# Patient Record
Sex: Female | Born: 1935 | Race: Black or African American | Hispanic: No | Smoking: Never smoker
Health system: Southern US, Community
[De-identification: ages and names within clinical notes are randomized; demographics above are authoritative.]

## PROBLEM LIST (undated history)

## (undated) DIAGNOSIS — N6009 Solitary cyst of unspecified breast: Secondary | ICD-10-CM

## (undated) DIAGNOSIS — M1990 Unspecified osteoarthritis, unspecified site: Secondary | ICD-10-CM

## (undated) DIAGNOSIS — I491 Atrial premature depolarization: Secondary | ICD-10-CM

## (undated) DIAGNOSIS — Z860100 Personal history of colon polyps, unspecified: Secondary | ICD-10-CM

## (undated) DIAGNOSIS — T4145XA Adverse effect of unspecified anesthetic, initial encounter: Secondary | ICD-10-CM

## (undated) DIAGNOSIS — T7840XA Allergy, unspecified, initial encounter: Secondary | ICD-10-CM

## (undated) DIAGNOSIS — D649 Anemia, unspecified: Secondary | ICD-10-CM

## (undated) DIAGNOSIS — K219 Gastro-esophageal reflux disease without esophagitis: Secondary | ICD-10-CM

## (undated) DIAGNOSIS — R011 Cardiac murmur, unspecified: Secondary | ICD-10-CM

## (undated) DIAGNOSIS — M8580 Other specified disorders of bone density and structure, unspecified site: Secondary | ICD-10-CM

## (undated) DIAGNOSIS — M810 Age-related osteoporosis without current pathological fracture: Secondary | ICD-10-CM

## (undated) DIAGNOSIS — I951 Orthostatic hypotension: Secondary | ICD-10-CM

## (undated) DIAGNOSIS — T8859XA Other complications of anesthesia, initial encounter: Secondary | ICD-10-CM

## (undated) DIAGNOSIS — Z5189 Encounter for other specified aftercare: Secondary | ICD-10-CM

## (undated) HISTORY — DX: Atrial premature depolarization: I49.1

## (undated) HISTORY — DX: Anemia, unspecified: D64.9

## (undated) HISTORY — PX: CATARACT EXTRACTION, BILATERAL: SHX1313

## (undated) HISTORY — DX: Solitary cyst of unspecified breast: N60.09

## (undated) HISTORY — DX: Other specified disorders of bone density and structure, unspecified site: M85.80

## (undated) HISTORY — PX: ABDOMINAL HYSTERECTOMY: SHX81

## (undated) HISTORY — DX: Orthostatic hypotension: I95.1

## (undated) HISTORY — DX: Encounter for other specified aftercare: Z51.89

## (undated) HISTORY — PX: APPENDECTOMY: SHX54

## (undated) HISTORY — DX: Cardiac murmur, unspecified: R01.1

## (undated) HISTORY — PX: BREAST CYST EXCISION: SHX579

## (undated) HISTORY — PX: COLONOSCOPY: SHX174

## (undated) HISTORY — DX: Allergy, unspecified, initial encounter: T78.40XA

## (undated) HISTORY — DX: Personal history of colon polyps, unspecified: Z86.0100

## (undated) HISTORY — DX: Unspecified osteoarthritis, unspecified site: M19.90

## (undated) HISTORY — PX: TONSILLECTOMY: SUR1361

## (undated) HISTORY — DX: Personal history of colonic polyps: Z86.010

## (undated) HISTORY — DX: Age-related osteoporosis without current pathological fracture: M81.0

## (undated) HISTORY — DX: Gastro-esophageal reflux disease without esophagitis: K21.9

---

## 1997-10-26 ENCOUNTER — Emergency Department (HOSPITAL_COMMUNITY): Admission: EM | Admit: 1997-10-26 | Discharge: 1997-10-26 | Payer: Self-pay | Admitting: Emergency Medicine

## 1999-01-19 ENCOUNTER — Other Ambulatory Visit: Admission: RE | Admit: 1999-01-19 | Discharge: 1999-01-19 | Payer: Self-pay | Admitting: Obstetrics and Gynecology

## 2000-01-23 ENCOUNTER — Other Ambulatory Visit: Admission: RE | Admit: 2000-01-23 | Discharge: 2000-01-23 | Payer: Self-pay | Admitting: Obstetrics and Gynecology

## 2001-02-03 ENCOUNTER — Other Ambulatory Visit: Admission: RE | Admit: 2001-02-03 | Discharge: 2001-02-03 | Payer: Self-pay | Admitting: Obstetrics and Gynecology

## 2002-02-10 ENCOUNTER — Other Ambulatory Visit: Admission: RE | Admit: 2002-02-10 | Discharge: 2002-02-10 | Payer: Self-pay | Admitting: Obstetrics and Gynecology

## 2003-06-15 ENCOUNTER — Encounter: Payer: Self-pay | Admitting: Internal Medicine

## 2004-05-24 ENCOUNTER — Ambulatory Visit: Payer: Self-pay | Admitting: Internal Medicine

## 2004-09-29 ENCOUNTER — Ambulatory Visit: Payer: Self-pay | Admitting: Internal Medicine

## 2004-10-19 ENCOUNTER — Ambulatory Visit: Payer: Self-pay | Admitting: Internal Medicine

## 2004-12-07 ENCOUNTER — Ambulatory Visit: Payer: Self-pay | Admitting: Internal Medicine

## 2004-12-14 ENCOUNTER — Ambulatory Visit: Payer: Self-pay | Admitting: Internal Medicine

## 2004-12-28 ENCOUNTER — Ambulatory Visit: Payer: Self-pay | Admitting: Internal Medicine

## 2005-02-22 ENCOUNTER — Ambulatory Visit: Payer: Self-pay | Admitting: Internal Medicine

## 2005-04-17 ENCOUNTER — Ambulatory Visit: Payer: Self-pay | Admitting: Internal Medicine

## 2005-12-20 ENCOUNTER — Ambulatory Visit: Payer: Self-pay | Admitting: Internal Medicine

## 2005-12-27 ENCOUNTER — Ambulatory Visit: Payer: Self-pay | Admitting: Internal Medicine

## 2006-02-19 ENCOUNTER — Ambulatory Visit: Payer: Self-pay | Admitting: Internal Medicine

## 2006-08-06 ENCOUNTER — Ambulatory Visit: Payer: Self-pay | Admitting: Internal Medicine

## 2006-08-06 LAB — CONVERTED CEMR LAB
Basophils Relative: 0.8 % (ref 0.0–1.0)
Bilirubin Urine: NEGATIVE
Bilirubin, Direct: 0.1 mg/dL (ref 0.0–0.3)
CO2: 29 meq/L (ref 19–32)
Eosinophils Absolute: 0.1 10*3/uL (ref 0.0–0.6)
Eosinophils Relative: 1.9 % (ref 0.0–5.0)
GFR calc Af Amer: 92 mL/min
Glucose, Bld: 87 mg/dL (ref 70–99)
Hemoglobin: 13.1 g/dL (ref 12.0–15.0)
Leukocytes, UA: NEGATIVE
Lymphocytes Relative: 40.7 % (ref 12.0–46.0)
MCV: 92.5 fL (ref 78.0–100.0)
Monocytes Absolute: 0.5 10*3/uL (ref 0.2–0.7)
Monocytes Relative: 9.5 % (ref 3.0–11.0)
Neutro Abs: 2.7 10*3/uL (ref 1.4–7.7)
Potassium: 4.2 meq/L (ref 3.5–5.1)
Specific Gravity, Urine: 1.01 (ref 1.000–1.03)
TSH: 3.28 microintl units/mL (ref 0.35–5.50)
Total Protein, Urine: NEGATIVE mg/dL
Total Protein: 7.9 g/dL (ref 6.0–8.3)
WBC: 5.6 10*3/uL (ref 4.5–10.5)

## 2006-08-27 ENCOUNTER — Encounter: Payer: Self-pay | Admitting: Cardiology

## 2006-08-27 ENCOUNTER — Ambulatory Visit: Payer: Self-pay

## 2006-09-06 ENCOUNTER — Ambulatory Visit: Payer: Self-pay | Admitting: Internal Medicine

## 2006-09-12 ENCOUNTER — Ambulatory Visit: Payer: Self-pay | Admitting: Cardiovascular Disease

## 2006-09-16 ENCOUNTER — Ambulatory Visit: Payer: Self-pay | Admitting: Cardiovascular Disease

## 2006-09-16 ENCOUNTER — Ambulatory Visit (HOSPITAL_COMMUNITY): Admission: RE | Admit: 2006-09-16 | Discharge: 2006-09-16 | Payer: Self-pay | Admitting: Cardiovascular Disease

## 2006-09-20 ENCOUNTER — Ambulatory Visit: Payer: Self-pay

## 2006-12-31 ENCOUNTER — Ambulatory Visit: Payer: Self-pay | Admitting: Internal Medicine

## 2006-12-31 LAB — CONVERTED CEMR LAB
ALT: 18 units/L (ref 0–40)
Alkaline Phosphatase: 55 units/L (ref 39–117)
BUN: 9 mg/dL (ref 6–23)
Basophils Relative: 0.8 % (ref 0.0–1.0)
Bilirubin Urine: NEGATIVE
CO2: 28 meq/L (ref 19–32)
Calcium: 9.6 mg/dL (ref 8.4–10.5)
Cholesterol: 281 mg/dL (ref 0–200)
GFR calc Af Amer: 107 mL/min
HDL: 127 mg/dL (ref >39.0)
Hemoglobin, Urine: NEGATIVE
Hemoglobin: 12.2 g/dL (ref 12.0–15.0)
Leukocytes, UA: NEGATIVE
Lymphocytes Relative: 41.6 % (ref 12.0–46.0)
MCHC: 33.3 g/dL (ref 30.0–36.0)
Monocytes Relative: 10.5 % (ref 3.0–11.0)
Specific Gravity, Urine: 1.01 (ref 1.000–1.03)
Total Protein: 7.9 g/dL (ref 6.0–8.3)

## 2007-04-02 ENCOUNTER — Ambulatory Visit: Payer: Self-pay | Admitting: Internal Medicine

## 2007-05-29 ENCOUNTER — Encounter: Admission: RE | Admit: 2007-05-29 | Discharge: 2007-05-29 | Payer: Self-pay | Admitting: Obstetrics and Gynecology

## 2007-06-10 ENCOUNTER — Encounter: Payer: Self-pay | Admitting: Internal Medicine

## 2007-06-17 ENCOUNTER — Ambulatory Visit: Payer: Self-pay | Admitting: Internal Medicine

## 2007-06-17 DIAGNOSIS — M810 Age-related osteoporosis without current pathological fracture: Secondary | ICD-10-CM | POA: Insufficient documentation

## 2007-06-17 DIAGNOSIS — Z860100 Personal history of colon polyps, unspecified: Secondary | ICD-10-CM | POA: Insufficient documentation

## 2007-06-17 DIAGNOSIS — K219 Gastro-esophageal reflux disease without esophagitis: Secondary | ICD-10-CM | POA: Insufficient documentation

## 2007-06-17 DIAGNOSIS — E559 Vitamin D deficiency, unspecified: Secondary | ICD-10-CM | POA: Insufficient documentation

## 2007-06-17 DIAGNOSIS — J45909 Unspecified asthma, uncomplicated: Secondary | ICD-10-CM

## 2007-06-17 DIAGNOSIS — M1990 Unspecified osteoarthritis, unspecified site: Secondary | ICD-10-CM | POA: Insufficient documentation

## 2007-09-16 ENCOUNTER — Telehealth: Payer: Self-pay | Admitting: Internal Medicine

## 2007-09-23 ENCOUNTER — Ambulatory Visit: Payer: Self-pay | Admitting: Internal Medicine

## 2007-09-23 LAB — CONVERTED CEMR LAB
BUN: 10 mg/dL (ref 6–23)
CO2: 31 meq/L (ref 19–32)
Calcium: 9.4 mg/dL (ref 8.4–10.5)
Chloride: 105 meq/L (ref 96–112)
Creatinine, Ser: 0.8 mg/dL (ref 0.4–1.2)
Vit D, 1,25-Dihydroxy: 37 (ref 30–89)

## 2007-09-25 ENCOUNTER — Encounter: Payer: Self-pay | Admitting: Internal Medicine

## 2007-09-29 ENCOUNTER — Ambulatory Visit: Payer: Self-pay | Admitting: Internal Medicine

## 2007-09-30 ENCOUNTER — Encounter (INDEPENDENT_AMBULATORY_CARE_PROVIDER_SITE_OTHER): Payer: Self-pay | Admitting: *Deleted

## 2007-10-08 ENCOUNTER — Telehealth: Payer: Self-pay | Admitting: Internal Medicine

## 2007-10-28 ENCOUNTER — Ambulatory Visit: Payer: Self-pay | Admitting: Internal Medicine

## 2007-10-29 ENCOUNTER — Ambulatory Visit: Payer: Self-pay | Admitting: Internal Medicine

## 2007-10-30 ENCOUNTER — Encounter: Payer: Self-pay | Admitting: Internal Medicine

## 2007-10-31 ENCOUNTER — Ambulatory Visit: Payer: Self-pay | Admitting: Internal Medicine

## 2007-12-15 ENCOUNTER — Encounter: Payer: Self-pay | Admitting: Internal Medicine

## 2007-12-24 ENCOUNTER — Encounter: Payer: Self-pay | Admitting: Internal Medicine

## 2007-12-25 ENCOUNTER — Ambulatory Visit: Payer: Self-pay | Admitting: Internal Medicine

## 2007-12-31 ENCOUNTER — Ambulatory Visit: Payer: Self-pay | Admitting: Internal Medicine

## 2007-12-31 LAB — CONVERTED CEMR LAB
AST: 20 units/L (ref 0–37)
Basophils Absolute: 0 10*3/uL (ref 0.0–0.1)
Basophils Relative: 0.9 % (ref 0.0–1.0)
Chloride: 103 meq/L (ref 96–112)
Cholesterol: 255 mg/dL (ref 0–200)
Creatinine, Ser: 0.8 mg/dL (ref 0.4–1.2)
Direct LDL: 119.8 mg/dL
Eosinophils Absolute: 0.2 10*3/uL (ref 0.0–0.7)
GFR calc Af Amer: 91 mL/min
GFR calc non Af Amer: 76 mL/min
HCT: 37.3 % (ref 36.0–46.0)
HDL: 116.1 mg/dL (ref >39.0)
Hemoglobin, Urine: NEGATIVE
Ketones, ur: NEGATIVE mg/dL
MCHC: 34 g/dL (ref 30.0–36.0)
MCV: 93.9 fL (ref 78.0–100.0)
Monocytes Absolute: 0.5 10*3/uL (ref 0.1–1.0)
Neutrophils Relative %: 39.1 % — ABNORMAL LOW (ref 43.0–77.0)
Platelets: 238 10*3/uL (ref 150–400)
Potassium: 4.2 meq/L (ref 3.5–5.1)
Total Bilirubin: 1 mg/dL (ref 0.3–1.2)
Triglycerides: 53 mg/dL (ref 0–149)
Urine Glucose: NEGATIVE mg/dL
Urobilinogen, UA: 0.2 (ref 0.0–1.0)
VLDL: 11 mg/dL (ref 0–40)

## 2008-01-05 ENCOUNTER — Telehealth (INDEPENDENT_AMBULATORY_CARE_PROVIDER_SITE_OTHER): Payer: Self-pay | Admitting: *Deleted

## 2008-01-07 ENCOUNTER — Telehealth (INDEPENDENT_AMBULATORY_CARE_PROVIDER_SITE_OTHER): Payer: Self-pay | Admitting: *Deleted

## 2008-01-07 ENCOUNTER — Ambulatory Visit: Payer: Self-pay | Admitting: Internal Medicine

## 2008-01-09 ENCOUNTER — Telehealth: Payer: Self-pay | Admitting: Pulmonary Disease

## 2008-02-05 ENCOUNTER — Ambulatory Visit: Payer: Self-pay | Admitting: Internal Medicine

## 2008-04-29 ENCOUNTER — Ambulatory Visit: Payer: Self-pay | Admitting: Internal Medicine

## 2008-07-06 ENCOUNTER — Ambulatory Visit: Payer: Self-pay | Admitting: Internal Medicine

## 2008-07-09 HISTORY — PX: ESOPHAGOGASTRODUODENOSCOPY: SHX1529

## 2008-07-13 ENCOUNTER — Ambulatory Visit: Payer: Self-pay | Admitting: Internal Medicine

## 2008-07-13 LAB — CONVERTED CEMR LAB
Calcium: 9.4 mg/dL (ref 8.4–10.5)
Chloride: 104 meq/L (ref 96–112)
Creatinine, Ser: 0.9 mg/dL (ref 0.4–1.2)
Glucose, Bld: 118 mg/dL — ABNORMAL HIGH (ref 70–99)
Potassium: 4 meq/L (ref 3.5–5.1)
Sodium: 140 meq/L (ref 135–145)
Vit D, 1,25-Dihydroxy: 30 (ref 30–89)

## 2008-07-16 ENCOUNTER — Ambulatory Visit: Payer: Self-pay | Admitting: Internal Medicine

## 2008-07-21 ENCOUNTER — Encounter: Payer: Self-pay | Admitting: Internal Medicine

## 2008-07-21 ENCOUNTER — Ambulatory Visit: Payer: Self-pay | Admitting: Internal Medicine

## 2008-07-27 ENCOUNTER — Encounter: Payer: Self-pay | Admitting: Internal Medicine

## 2008-12-30 ENCOUNTER — Encounter: Payer: Self-pay | Admitting: Internal Medicine

## 2009-01-18 ENCOUNTER — Ambulatory Visit: Payer: Self-pay | Admitting: Internal Medicine

## 2009-01-18 LAB — CONVERTED CEMR LAB
Basophils Relative: 0.4 % (ref 0.0–3.0)
Eosinophils Relative: 2.5 % (ref 0.0–5.0)
HCT: 34 % — ABNORMAL LOW (ref 36.0–46.0)
Hemoglobin: 11.8 g/dL — ABNORMAL LOW (ref 12.0–15.0)
Lymphs Abs: 3.1 10*3/uL (ref 0.7–4.0)
Monocytes Relative: 7.8 % (ref 3.0–12.0)
Platelets: 217 10*3/uL (ref 150.0–400.0)
RBC: 3.59 M/uL — ABNORMAL LOW (ref 3.87–5.11)
WBC: 6.6 10*3/uL (ref 4.5–10.5)

## 2009-01-21 LAB — CONVERTED CEMR LAB
ALT: 18 units/L (ref 0–35)
AST: 25 units/L (ref 0–37)
Albumin: 3.8 g/dL (ref 3.5–5.2)
Alkaline Phosphatase: 54 units/L (ref 39–117)
Bilirubin Urine: NEGATIVE
Bilirubin, Direct: 0.1 mg/dL (ref 0.0–0.3)
CO2: 27 meq/L (ref 19–32)
Chloride: 103 meq/L (ref 96–112)
Cholesterol: 274 mg/dL — ABNORMAL HIGH (ref 0–200)
Creatinine, Ser: 0.7 mg/dL (ref 0.4–1.2)
Direct LDL: 131.7 mg/dL
HDL: 135.7 mg/dL (ref >39.00)
Hemoglobin, Urine: NEGATIVE
Ketones, ur: NEGATIVE mg/dL
Total CHOL/HDL Ratio: 2
Total Protein, Urine: NEGATIVE mg/dL
Urine Glucose: NEGATIVE mg/dL
Urobilinogen, UA: 0.2 (ref 0.0–1.0)
VLDL: 8.4 mg/dL (ref 0.0–40.0)

## 2009-02-02 ENCOUNTER — Telehealth: Payer: Self-pay | Admitting: Internal Medicine

## 2009-07-26 ENCOUNTER — Ambulatory Visit: Payer: Self-pay | Admitting: Internal Medicine

## 2009-10-05 ENCOUNTER — Ambulatory Visit: Payer: Self-pay | Admitting: Internal Medicine

## 2009-10-05 DIAGNOSIS — K649 Unspecified hemorrhoids: Secondary | ICD-10-CM

## 2009-10-06 ENCOUNTER — Telehealth: Payer: Self-pay | Admitting: Internal Medicine

## 2009-10-17 ENCOUNTER — Ambulatory Visit: Payer: Self-pay | Admitting: Internal Medicine

## 2009-10-17 DIAGNOSIS — D649 Anemia, unspecified: Secondary | ICD-10-CM

## 2009-10-19 LAB — CONVERTED CEMR LAB
Basophils Relative: 0.5 % (ref 0.0–3.0)
Eosinophils Absolute: 0.1 10*3/uL (ref 0.0–0.7)
Eosinophils Relative: 2 % (ref 0.0–5.0)
HCT: 36.8 % (ref 36.0–46.0)
Lymphs Abs: 2.9 10*3/uL (ref 0.7–4.0)
MCHC: 34.4 g/dL (ref 30.0–36.0)
MCV: 93.1 fL (ref 78.0–100.0)
Monocytes Absolute: 0.7 10*3/uL (ref 0.1–1.0)
Platelets: 256 10*3/uL (ref 150.0–400.0)
WBC: 6.5 10*3/uL (ref 4.5–10.5)

## 2010-01-02 ENCOUNTER — Encounter: Payer: Self-pay | Admitting: Internal Medicine

## 2010-01-27 ENCOUNTER — Ambulatory Visit: Payer: Self-pay | Admitting: Internal Medicine

## 2010-01-27 LAB — CONVERTED CEMR LAB
ALT: 16 U/L
AST: 22 U/L
Albumin: 4 g/dL
Alkaline Phosphatase: 48 U/L
BUN: 12 mg/dL
Basophils Absolute: 0 10*3/uL
Basophils Relative: 0.6 %
Bilirubin Urine: NEGATIVE
Bilirubin, Direct: 0.1 mg/dL
CO2: 28 meq/L
Calcium: 9.4 mg/dL
Chloride: 103 meq/L
Cholesterol: 281 mg/dL — ABNORMAL HIGH
Creatinine, Ser: 0.8 mg/dL
Direct LDL: 104.9 mg/dL
Eosinophils Absolute: 0.1 10*3/uL
Eosinophils Relative: 2.4 %
GFR calc non Af Amer: 96.2 mL/min
Glucose, Bld: 96 mg/dL
HCT: 37.8 %
HDL: 130.5 mg/dL
Hemoglobin, Urine: NEGATIVE
Hemoglobin: 12.9 g/dL
Ketones, ur: NEGATIVE mg/dL
Leukocytes, UA: NEGATIVE
Lymphocytes Relative: 42.4 %
Lymphs Abs: 2.4 10*3/uL
MCHC: 34.1 g/dL
MCV: 95.4 fL
Monocytes Absolute: 0.4 10*3/uL
Monocytes Relative: 7.6 %
Neutro Abs: 2.7 10*3/uL
Neutrophils Relative %: 47 %
Nitrite: NEGATIVE
Platelets: 206 10*3/uL
Potassium: 4.5 meq/L
RBC: 3.96 M/uL
RDW: 12.6 %
Sodium: 140 meq/L
Specific Gravity, Urine: 1.01
TSH: 2.79 u[IU]/mL
Total Bilirubin: 0.7 mg/dL
Total CHOL/HDL Ratio: 2
Total Protein, Urine: NEGATIVE mg/dL
Total Protein: 7.4 g/dL
Triglycerides: 76 mg/dL
Urine Glucose: NEGATIVE mg/dL
Urobilinogen, UA: 0.2
VLDL: 15.2 mg/dL
Vitamin B-12: 382 pg/mL
WBC: 5.7 10*3/uL
pH: 6.5

## 2010-02-02 ENCOUNTER — Ambulatory Visit: Payer: Self-pay | Admitting: Internal Medicine

## 2010-02-02 ENCOUNTER — Encounter: Payer: Self-pay | Admitting: Internal Medicine

## 2010-04-27 ENCOUNTER — Ambulatory Visit: Payer: Self-pay | Admitting: Internal Medicine

## 2010-05-15 ENCOUNTER — Telehealth: Payer: Self-pay | Admitting: Internal Medicine

## 2010-05-15 ENCOUNTER — Ambulatory Visit: Payer: Self-pay | Admitting: Internal Medicine

## 2010-05-17 ENCOUNTER — Telehealth: Payer: Self-pay | Admitting: Internal Medicine

## 2010-05-29 ENCOUNTER — Ambulatory Visit: Payer: Self-pay | Admitting: Internal Medicine

## 2010-08-08 NOTE — Assessment & Plan Note (Signed)
Summary: 2 WK FU  STC   Vital Signs:  Patient profile:   75 year old female Menstrual status:  postmenopausal Height:      63 inches Weight:      133.75 pounds BMI:     23.78 Temp:     97.8 degrees F oral Pulse rate:   84 / minute Pulse rhythm:   regular Resp:     16 per minute BP sitting:   130 / 70  (left arm) Cuff size:   regular  Vitals Entered By: Lanier Prude, Beverly Gust) (May 29, 2010 3:34 PM) CC: 2 wk f/u. feels much better still c/o sporadic cough and congestion Is Patient Diabetic? No   CC:  2 wk f/u. feels much better still c/o sporadic cough and congestion.  Current Medications (verified): 1)  Pantoprazole Sodium 40 Mg Tbec (Pantoprazole Sodium) .... Take 1 Tablet By Mouth Once A Day 2)  Estrogel 0.75 Mg/1.25 Gm (0.06%)  Gel (Estradiol) .... Use Qd 3)  Vitamin D3 1000 Unit  Tabs (Cholecalciferol) .Marland Kitchen.. 1  By Mouth Daily 4)  Claritin 10 Mg  Caps (Loratadine) .... As Needed 5)  Symbicort 80-4.5 Mcg/act  Aero (Budesonide-Formoterol Fumarate) .... Two Puffs First Thing in Am and Then Again in Evening 6)  Ventolin Hfa 108 (90 Base) Mcg/act  Aers (Albuterol Sulfate) .Marland Kitchen.. 1-2 Puffs Every 4-6 Hours As Needed 7)  Anusol-Hc 2.5 % Crea (Hydrocortisone) .... Once Daily- Two Times A Day Prn 8)  Premarin 0.625 Mg/gm Crea (Estrogens, Conjugated) .... 3 Times Weekly 9)  Anusol-Hc 25 Mg Supp (Hydrocortisone Acetate) .Marland Kitchen.. 1 Pr Bid 10)  Miralax  Powd (Polyethylene Glycol 3350) .Marland Kitchen.. 1 Scoop Daily As Needed 11)  Mytussin Ac 100-10 Mg/78ml Syrp (Guaifenesin-Codeine) .... 5-10 Ml By Mouth Qid As Needed For Cough  Allergies (verified): 1)  ! * Novocaine 2)  ! * Shellfish 3)  * Statins  Physical Exam  General:  Well-developed,well-nourished,in no acute distress; alert,appropriate and cooperative throughout examination Mouth:  Oral mucosa and oropharynx without lesions or exudates.  Teeth in good repair. Lungs:  normal respiratory effort, no intercostal retractions, no accessory  muscle use, normal breath sounds, no dullness, no fremitus, no crackles, and no wheezes.   Heart:  normal rate, regular rhythm, no murmur, no gallop, no rub, and no JVD.   Skin:  turgor normal, color normal, no rashes, no suspicious lesions, no ecchymoses, no petechiae, no purpura, no ulcerations, and no edema.   Psych:  Cognition and judgment appear intact. Alert and cooperative with normal attention span and concentration. No apparent delusions, illusions, hallucinations   Impression & Recommendations:  Problem # 1:  URI (ICD-465.9) Assessment Improved COPD on CXR likely is reflecting her asthma/URI Her updated medication list for this problem includes:    Claritin 10 Mg Caps (Loratadine) .Marland Kitchen... As needed    Mytussin Ac 100-10 Mg/50ml Syrp (Guaifenesin-codeine) .Marland Kitchen... 5-10 ml by mouth qid as needed for cough  Problem # 2:  Cardiomegaly on CXR Assessment: Comment Only I reviewed Card w/u from 2008 - Dr Eden Emms  Complete Medication List: 1)  Pantoprazole Sodium 40 Mg Tbec (Pantoprazole sodium) .... Take 1 tablet by mouth once a day 2)  Estrogel 0.75 Mg/1.25 Gm (0.06%) Gel (Estradiol) .... Use qd 3)  Vitamin D3 1000 Unit Tabs (Cholecalciferol) .Marland Kitchen.. 1  by mouth daily 4)  Claritin 10 Mg Caps (Loratadine) .... As needed 5)  Symbicort 80-4.5 Mcg/act Aero (Budesonide-formoterol fumarate) .... Two puffs first thing in am and then again in  evening 6)  Ventolin Hfa 108 (90 Base) Mcg/act Aers (Albuterol sulfate) .Marland Kitchen.. 1-2 puffs every 4-6 hours as needed 7)  Anusol-hc 2.5 % Crea (Hydrocortisone) .... Once daily- two times a day prn 8)  Premarin 0.625 Mg/gm Crea (Estrogens, conjugated) .... 3 times weekly 9)  Anusol-hc 25 Mg Supp (Hydrocortisone acetate) .Marland Kitchen.. 1 pr bid 10)  Miralax Powd (Polyethylene glycol 3350) .Marland Kitchen.. 1 scoop daily as needed 11)  Mytussin Ac 100-10 Mg/59ml Syrp (Guaifenesin-codeine) .... 5-10 ml by mouth qid as needed for cough  Patient Instructions: 1)  Call if you are not well in a  reasonable amount of time.    Orders Added: 1)  Est. Patient Level III [84132]

## 2010-08-08 NOTE — Assessment & Plan Note (Signed)
Summary: YEARLY FU/ LABS SAME DAY /MEDICARE/NWS   Vital Signs:  Patient profile:   75 year old female Height:      63 inches Weight:      134 pounds BMI:     23.82 Temp:     97.3 degrees F oral Pulse rate:   76 / minute Pulse rhythm:   irregular Resp:     16 per minute BP sitting:   130 / 80  (left arm) Cuff size:   regular  Vitals Entered By: Lanier Prude, Beverly Gust) (February 02, 2010 10:39 AM) CC: cpx Is Patient Diabetic? No Comments pt needs Rf on Ventolin HFA   Primary Care Provider:  Sula Soda, MD   CC:  cpx.  History of Present Illness: The patient presents for a wellness examination  Patient past medical history, social history, and family history reviewed in detail no significant changes.  Patient is physically active. Depression is negative and mood is good. Hearing is normal, and able to perform activities of daily living. Risk of falling is negligible and home safety has been reviewed and is appropriate. Patient has normal height, weight, and visual acuity. Patient has been counseled on age-appropriate routine health concerns for screening and prevention. Education, counseling done.  The patient presents for a follow up of OA, GERD and asthma  Current Medications (verified): 1)  Pantoprazole Sodium 40 Mg Tbec (Pantoprazole Sodium) .... Take 1 Tablet By Mouth Once A Day 2)  Estrogel 0.75 Mg/1.25 Gm (0.06%)  Gel (Estradiol) .... Use Qd 3)  Vitamin D3 1000 Unit  Tabs (Cholecalciferol) .Marland Kitchen.. 1  By Mouth Daily 4)  Claritin 10 Mg  Caps (Loratadine) .... As Needed 5)  Symbicort 80-4.5 Mcg/act  Aero (Budesonide-Formoterol Fumarate) .... Two Puffs First Thing in Am and Then Again in Evening 6)  Ventolin Hfa 108 (90 Base) Mcg/act  Aers (Albuterol Sulfate) .Marland Kitchen.. 1-2 Puffs Every 4-6 Hours As Needed 7)  Anusol-Hc 2.5 % Crea (Hydrocortisone) .... Once Daily- Two Times A Day Prn 8)  Premarin 0.625 Mg/gm Crea (Estrogens, Conjugated) .... 3 Times Weekly 9)  Anusol-Hc 25 Mg Supp  (Hydrocortisone Acetate) .Marland Kitchen.. 1 Pr Bid 10)  Miralax  Powd (Polyethylene Glycol 3350) .Marland Kitchen.. 1 Scoop Daily As Needed  Allergies (verified): 1)  ! * Novocaine 2)  ! * Shellfish 3)  * Statins  Past History:  Past Medical History: Last updated: 10/17/2009 Asthma Dr Sherene Sires   - PFT 02/05/08 FEV1 62%, ratio of 60%, with 12% improvement after bronchodilator Colonic polyps, hx of adenomas (small)1998 and 201...Marland KitchenMarland KitchenGessner GERD Osteoarthritis Osteopenia PAC L breast cyst Vit D def GI Dr Leone Payor - colon 2010 Gyn   Dr Rosalio Macadamia Card Dr Eden Emms Hemorrhoids  Past Surgical History: Last updated: 10/30/2007 Hysterectomy complete Appendectomy Tonsillectomy Breast cysts  Family History: Last updated: 07/16/2008 Family History Hypertension Family History of Diabetes:M 90  No FH of Colon Cancer: no family history of atopy or respiratory disease  Social History: Last updated: 10/17/2009 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    Physical Exam  General:  Well-developed,well-nourished,in no acute distress; alert,appropriate and cooperative throughout examination Head:  Normocephalic and atraumatic without obvious abnormalities. No apparent alopecia or balding. Eyes:  No corneal or conjunctival inflammation noted. EOMI. Perrla. Ears:  External ear exam shows no significant lesions or deformities.  Otoscopic examination reveals clear canals,  tympanic membranes are intact bilaterally without bulging, retraction, inflammation or discharge. Hearing is grossly normal bilaterally. Nose:  External nasal examination shows no deformity or inflammation. Nasal mucosa are pink and moist without lesions or exudates. Mouth:  Oral mucosa and oropharynx without lesions or exudates.  Teeth in good repair. Neck:  No deformities, masses, or tenderness noted. Chest Wall:  No deformities, masses, or tenderness noted. Lungs:  Normal respiratory effort, chest expands symmetrically. Lungs are clear to auscultation, no crackles or wheezes. Heart:  Normal rate and regular rhythm. S1 and S2 normal without gallop, murmur, click, rub or other extra sounds. Abdomen:  Bowel sounds positive,abdomen soft and non-tender without masses, organomegaly or hernias noted. Msk:  No deformity or scoliosis noted of thoracic or lumbar spine.   Pulses:  R and L carotid,radial,femoral,dorsalis pedis and posterior tibial pulses are full and equal bilaterally Extremities:  No clubbing, cyanosis, edema, or deformity noted with normal full range of motion of all joints.   Neurologic:  No cranial nerve deficits noted. Station and gait are normal. Plantar reflexes are down-going bilaterally. DTRs are symmetrical throughout. Sensory, motor and coordinative functions appear intact. Skin:  Intact without suspicious lesions or rashes Cervical Nodes:  No significant cervical,  or inguinal adenopathy. Inguinal Nodes:  No significant adenopathy Psych:  Cognition and judgment appear intact. Alert and cooperative with normal attention span and concentration. No apparent delusions, illusions, hallucinations   Impression & Recommendations:  Problem # 1:  PHYSICAL EXAMINATION (ICD-V70.0) Assessment New Overall doing well, age appropriate education and counseling updated and referral for appropriate preventive services done unless declined, immunizations up to date or declined, diet counseling  done if overweight, urged to quit smoking if smokes, most recent labs reviewed and current ordered if appropriate, ecg reviewed or declined (interpretation per ECG scanned in the EMR if done); information regarding Medicare Preventation requirements given if appropriate.  Colon, gyn - up to date The labs were reviewed with the patient.   Problem # 2:  HEMORRHOIDS, NOS (ICD-455.6) Assessment: Improved Miralax  Problem # 3:  ASTHMA (ICD-493.90) Assessment: Improved  Her updated medication list for this problem includes:    Symbicort 80-4.5 Mcg/act Aero (Budesonide-formoterol fumarate) .Marland Kitchen..Marland Kitchen Two puffs first thing in am and then again in evening    Ventolin Hfa 108 (90 Base) Mcg/act Aers (Albuterol sulfate) .Marland Kitchen... 1-2 puffs every 4-6 hours as needed  Problem # 4:  OSTEOARTHRITIS (ICD-715.90) Assessment: Unchanged  Complete Medication List: 1)  Pantoprazole Sodium 40 Mg Tbec (Pantoprazole sodium) .... Take 1 tablet by mouth once a day 2)  Estrogel 0.75 Mg/1.25 Gm (0.06%) Gel (Estradiol) .... Use qd 3)  Vitamin D3 1000 Unit Tabs (Cholecalciferol) .Marland Kitchen.. 1  by mouth daily 4)  Claritin 10 Mg Caps (Loratadine) .... As needed 5)  Symbicort 80-4.5 Mcg/act Aero (Budesonide-formoterol fumarate) .... Two puffs first thing in am and then again in evening 6)  Ventolin Hfa 108 (90 Base) Mcg/act Aers (Albuterol sulfate) .Marland Kitchen.. 1-2 puffs every 4-6 hours as needed 7)  Anusol-hc 2.5 % Crea (Hydrocortisone) .... Once daily- two times a day prn 8)  Premarin 0.625 Mg/gm Crea (Estrogens, conjugated) .... 3 times weekly 9)  Anusol-hc 25 Mg Supp (Hydrocortisone acetate) .Marland Kitchen.. 1 pr bid 10)  Miralax Powd (Polyethylene glycol 3350) .Marland KitchenMarland KitchenMarland Kitchen  1 scoop daily as needed  Other Orders: EKG w/ Interpretation (93000) Pneumococcal Vaccine (16109) Admin 1st Vaccine (60454)  Patient Instructions: 1)  Start taking a yoga/chair yoga class  Prescriptions: SYMBICORT 80-4.5 MCG/ACT  AERO (BUDESONIDE-FORMOTEROL FUMARATE) Two puffs  first thing in am and then again in evening  #1 x 11   Entered and Authorized by:   Tresa Garter MD   Signed by:   Tresa Garter MD on 02/02/2010   Method used:   Print then Give to Patient   RxID:   [id number] VENTOLIN HFA 108 (90 BASE) MCG/ACT  AERS (ALBUTEROL SULFATE) 1-2 puffs every 4-6 hours as needed  #1 x 12   Entered and Authorized by:   Tresa Garter MD   Signed by:   Tresa Garter MD on 02/02/2010   Method used:   Electronically to        [facility] [id number] (retail)       [address] [place], [place]  [address]       Ph: [phone]       Fax: [phone]   RxID:   [id number]    Immunizations Administered:  Pneumonia Vaccine:    Vaccine Type: Pneumovax    Site: left deltoid    Mfr: Merck    Dose: 0.5 ml    Route: IM    Given by: Lanier Prude, CMA(AAMA)    Exp. Date: 07/26/2011    Lot #: [id number]    VIS given: 02/04/96 version given February 02, 2010.

## 2010-08-08 NOTE — Procedures (Signed)
Summary: Colonoscopy: Dr. Doreatha Martin: Normal    EGD  Procedure date:  06/15/2003  Findings:      Findings: Normal  Location: [facility]    Procedures Next Due Date:    EGD: 06/2008  Patient Name: Cindy, Meza MRN: [id number] Procedure Procedures: Colonoscopy CPT: 40981.  Personnel: Endoscopist: Ulyess Mort, MD.  Exam Location: Exam performed in Outpatient Clinic. Outpatient  Patient Consent: Procedure, Alternatives, Risks and Benefits discussed, consent obtained, from patient. Consent was obtained by the RN.  Indications  Surveillance of: Adenomatous Polyp(s).  History  Current Medications: Patient is not currently taking Coumadin.  Pre-Exam Physical: Entire physical exam was normal.  Exam Exam: Extent of exam reached: Cecum, extent intended: Cecum.  The cecum was identified by appendiceal orifice and IC valve. Colon retroflexion performed. Images were not taken. ASA Classification: II. Tolerance: good.  Monitoring: Pulse and BP monitoring, Oximetry used. Supplemental O2 given.  Colon Prep Prep results: good.  Sedation Meds: Patient assessed and found to be appropriate for moderate (conscious) sedation. Fentanyl 100 mcg. given IV. Versed 10 mg. given IV.  Findings - NOT SEEN ON EXAM: Cecum to Rectum. Polyps, AVM's, Colitis, Tumors, Melanosis, Crohn's, Diverticulosis,   Assessment Normal examination.  Events  Unplanned Interventions: No intervention was required.  Unplanned Events: There were no complications. Plans Patient Education: Patient given standard instructions for: a normal exam. Yearly hemoccult testing recommended. Patient instructed to get routine colonoscopy every 5 years.  Disposition: After procedure patient sent to recovery. After recovery patient sent home.  Patient Name: Cindy, Meza MRN: [id number] Procedure Procedures: Colonoscopy CPT: 62130.  Personnel: Endoscopist: Ulyess Mort, MD.  Exam  Location: Exam performed in Outpatient Clinic. Outpatient  Patient Consent: Procedure, Alternatives, Risks and Benefits discussed, consent obtained, from patient. Consent was obtained by the RN.  Indications  Surveillance of: Adenomatous Polyp(s).  History  Current Medications: Patient is not currently taking Coumadin.  Pre-Exam Physical: Entire physical exam was normal.  Exam Exam: Extent of exam reached: Cecum, extent intended: Cecum.  The cecum was identified by appendiceal orifice and IC valve. Colon retroflexion performed. Images were not taken. ASA Classification: II. Tolerance: good.  Monitoring: Pulse and BP monitoring, Oximetry used. Supplemental O2 given.  Colon Prep Prep results: good.  Sedation Meds: Patient assessed and found to be appropriate for moderate (conscious) sedation. Fentanyl 100 mcg. given IV. Versed 10 mg. given IV.  Findings - NOT SEEN ON EXAM: Cecum to Rectum. Polyps, AVM's, Colitis, Tumors, Melanosis, Crohn's, Diverticulosis,   Assessment Normal examination.  Events  Unplanned Interventions: No intervention was required.  Unplanned Events: There were no complications. PlansPatient Education: Patient given standard instructions for: a normal exam. Yearly hemoccult testing recommended. Patient instructed to get routine colonoscopy every 5 years.  Disposition: After procedure patient sent to recovery. After recovery patient sent home.   This report was created from the original endoscopy report, which was reviewed and signed by the above listed endoscopist.

## 2010-08-08 NOTE — Progress Notes (Signed)
Summary: RESULTS  Phone Note Call from Patient Call back at Home Phone [phone]   Summary of Call: Patient is requesting results of cxr. Initial call taken by: Lamar Sprinkles, CMA,  May 17, 2010 12:25 PM  Follow-up for Phone Call        slightly enlarged heart and COPD but no pneumonia Follow-up by: Etta Grandchild MD,  May 17, 2010 12:28 PM  Additional Follow-up for Phone Call Additional follow up Details #1::        called pt and advised that there was no pneumonia, slightly enlarged heart and COPD.   pt stated that she has been coughing up some yellowish mucus today and wanted to know if there was anything else Dr Yetta Barre would like for her to take. Please advise. Additional Follow-up by: Alysia Penna,  May 17, 2010 4:48 PM    Additional Follow-up for Phone Call Additional follow up Details #2::    called pt and advised that a prescription had been sent to [facility] on [address]  Follow-up by: Alysia Penna,  May 18, 2010 12:39 PM  New/Updated Medications: AZITHROMYCIN 500 MG TABS (AZITHROMYCIN) take one by mouth once daily for 3 days Prescriptions: AZITHROMYCIN 500 MG TABS (AZITHROMYCIN) take one by mouth once daily for 3 days  #3 x 0   Entered and Authorized by:   Etta Grandchild MD   Signed by:   Etta Grandchild MD on 05/18/2010   Method used:   Electronically to        [facility] [id number] (retail)       [address] [place], [place]  [address]       Ph: [phone]       Fax: [phone]   RxID:   [id number]

## 2010-08-08 NOTE — Assessment & Plan Note (Signed)
Summary: 6 MTH FU $50  STC   Vital Signs:  Patient profile:   75 year old female Weight:      134 pounds Temp:     97.4 degrees F oral Pulse rate:   73 / minute BP sitting:   124 / 74  (left arm)  Vitals Entered By: Tora Perches (July 26, 2009 4:02 PM) CC: f/u Is Patient Diabetic? No   Primary Care Provider:  Vonzella Althaus  CC:  f/u.  History of Present Illness: C/o hemorrhoids acting  up - saw Dr Corrinne Eagle for a CPX and had a rectal  Preventive Screening-Counseling & Management  Alcohol-Tobacco     Smoking Status: never  Current Medications (verified): 1)  Prilosec 20 Mg  Cpdr (Omeprazole) .... Take 1 Tablet By Mouth Once A Day 2)  Estrogel 0.75 Mg/1.25 Gm (0.06%)  Gel (Estradiol) .... Use Qd 3)  Vitamin D3 1000 Unit  Tabs (Cholecalciferol) .Marland Kitchen.. 1  By Mouth Daily 4)  Claritin 10 Mg  Caps (Loratadine) .... As Needed 5)  Symbicort 80-4.5 Mcg/act  Aero (Budesonide-Formoterol Fumarate) .... Two Puffs First Thing in Am and Then Again in Evening 6)  Ventolin Hfa 108 (90 Base) Mcg/act  Aers (Albuterol Sulfate) .Marland Kitchen.. 1-2 Puffs Every 4-6 Hours As Needed 7)  Anusol-Hc 2.5 % Crea (Hydrocortisone) .... Once Daily- Two Times A Day Prn 8)  Premarin 0.625 Mg/gm Crea (Estrogens, Conjugated) .... 3 Times Weekly 9)  Anusol-Hc 25 Mg Supp (Hydrocortisone Acetate) .Marland Kitchen.. 1 Pr Bid  Allergies: 1)  ! * Novocaine 2)  ! * Shellfish 3)  * Statins  Past History:  Past Medical History: Asthma Dr Sherene Sires   - PFT 02/05/08 FEV1 62%, ratio of 60%, with 12% improvement after bronchodilator Colonic polyps, hx of GERD Osteoarthritis Osteopenia PAC L breast cyst Vit D def GI Dr Leone Payor - colon 2010 Gyn   Dr Rosalio Macadamia Card Dr Eden Emms Hemorrhoids  Family History: Reviewed history from 07/16/2008 and no changes required. Family History Hypertension Family History of Diabetes:M 47  No FH of Colon Cancer: no family history of atopy or respiratory disease  Social History: Reviewed history from  10/30/2007 and no changes required. Single Never Smoked Alcohol Use - yes Occupation: retired Child psychotherapist  Review of Systems  The patient denies fever, chest pain, and abdominal pain.    Physical Exam  General:  Well-developed,well-nourished,in no acute distress; alert,appropriate and cooperative throughout examination Nose:  External nasal examination shows no deformity or inflammation. Nasal mucosa are pink and moist without lesions or exudates. Mouth:  Oral mucosa and oropharynx without lesions or exudates.  Teeth in good repair. Lungs:  Normal respiratory effort, chest expands symmetrically. Lungs are clear to auscultation, no crackles or wheezes. Heart:  Normal rate and regular rhythm. S1 and S2 normal without gallop, murmur, click, rub or other extra sounds. Abdomen:  Bowel sounds positive,abdomen soft and non-tender without masses, organomegaly or hernias noted. Msk:  No deformity or scoliosis noted of thoracic or lumbar spine.   Extremities:  No clubbing, cyanosis, edema, or deformity noted with normal full range of motion of all joints.   Neurologic:  No cranial nerve deficits noted. Station and gait are normal. Plantar reflexes are down-going bilaterally. DTRs are symmetrical throughout. Sensory, motor and coordinative functions appear intact. Skin:  Intact without suspicious lesions or rashes Psych:  Cognition and judgment appear intact. Alert and cooperative with normal attention span and concentration. No apparent delusions, illusions, hallucinations   Impression & Recommendations:  Problem # 1:  HEMORRHOIDS, NOS (ICD-455.6) Assessment Comment Only Rx prn  Problem # 2:  OSTEOARTHRITIS (ICD-715.90) Assessment: Unchanged  Problem # 3:  GERD (ICD-530.81) Assessment: Unchanged  Her updated medication list for this problem includes:    Prilosec 20 Mg Cpdr (Omeprazole) .Marland Kitchen... Take 1 tablet by mouth once a day  Problem # 4:  COLONIC POLYPS, HX OF  (ICD-V12.72) Assessment: Unchanged  Complete Medication List: 1)  Prilosec 20 Mg Cpdr (Omeprazole) .... Take 1 tablet by mouth once a day 2)  Estrogel 0.75 Mg/1.25 Gm (0.06%) Gel (Estradiol) .... Use qd 3)  Vitamin D3 1000 Unit Tabs (Cholecalciferol) .Marland Kitchen.. 1  by mouth daily 4)  Claritin 10 Mg Caps (Loratadine) .... As needed 5)  Symbicort 80-4.5 Mcg/act Aero (Budesonide-formoterol fumarate) .... Two puffs first thing in am and then again in evening 6)  Ventolin Hfa 108 (90 Base) Mcg/act Aers (Albuterol sulfate) .Marland Kitchen.. 1-2 puffs every 4-6 hours as needed 7)  Anusol-hc 2.5 % Crea (Hydrocortisone) .... Once daily- two times a day prn 8)  Premarin 0.625 Mg/gm Crea (Estrogens, conjugated) .... 3 times weekly 9)  Anusol-hc 25 Mg Supp (Hydrocortisone acetate) .Marland Kitchen.. 1 pr bid 10)  Miralax Powd (Polyethylene glycol 3350) .Marland Kitchen.. 1 scoop daily as needed  Patient Instructions: 1)  Flax seed 2)  Please schedule a follow-up appointment in 6 months well w/labs v70.0. Prescriptions: VENTOLIN HFA 108 (90 BASE) MCG/ACT  AERS (ALBUTEROL SULFATE) 1-2 puffs every 4-6 hours as needed  #1 x 12   Entered and Authorized by:   Tresa Garter MD   Signed by:   Tresa Garter MD on 07/26/2009   Method used:   Print then Give to Patient   RxID:   [id number] MIRALAX  POWD (POLYETHYLENE GLYCOL 3350) 1 scoop daily as needed  #1 x 12   Entered and Authorized by:   Tresa Garter MD   Signed by:   Tresa Garter MD on 07/26/2009   Method used:   Print then Give to Patient   RxID:   [id number] ANUSOL-HC 2.5 % CREA (HYDROCORTISONE) once daily- two times a day prn  #60 g x 3   Entered and Authorized by:   Tresa Garter MD   Signed by:   Tresa Garter MD on 07/26/2009   Method used:   Print then Give to Patient   RxID:   [id number] ANUSOL-HC 25 MG SUPP (HYDROCORTISONE ACETATE) 1 pr bid  #20 x 3   Entered and Authorized by:   Tresa Garter MD   Signed by:   Tresa Garter MD on 07/26/2009   Method used:   Print then Give to Patient   RxID:   [id number]

## 2010-08-08 NOTE — Assessment & Plan Note (Signed)
Summary: FLU VAC  AVP  STC   Nurse Visit   Allergies: 1)  ! * Novocaine 2)  ! * Shellfish 3)  * Statins  Orders Added: 1)  Flu Vaccine 19yrs + MEDICARE PATIENTS [Q2039] 2)  Administration Flu vaccine - MCR [G0008] .lbmedflu   Flu Vaccine Consent Questions     Do you have a history of severe allergic reactions to this vaccine? no    Any prior history of allergic reactions to egg and/or gelatin? no    Do you have a sensitivity to the preservative Thimersol? no    Do you have a past history of Guillan-Barre Syndrome? no    Do you currently have an acute febrile illness? no    Have you ever had a severe reaction to latex? no    Vaccine information given and explained to patient? yes    Are you currently pregnant? no    Lot Number:[id number]   Exp Date:01/06/2011   Site Given  Left Deltoid IM Lanier Prude, [facility])  April 27, 2010 11:26 AM

## 2010-08-08 NOTE — Assessment & Plan Note (Signed)
Summary: ABD. DISCOMFORT, CONSTIPATION, RECTAL BLEEDING            DEB...    History of Present Illness Visit Type: Follow-up Visit Primary GI MD: Stan Head MD Primary Provider: Sula Soda, MD  Requesting Provider: n/a Chief Complaint: rectal bleeding and constipation  History of Present Illness:   She saw Dr. Rosalio Macadamia 07/05/09 for annual and "hardness" was noted on rectal exam. She  then saw Dr. Posey Rea and was instructed to try MiraLax and flaxseed. She had more bleedng when she used MiraLax and is now using dietary fber with success. She is better with respect to constipation and bleedng. there is abdominal pain in the ower quadrants on an intermittent basis. She will notice after defecation at times. The rectal bleeding has been a chronc intermttent problem. Using hemorrhoidal HC suppositories and cream.  Also having some AM heartburn - transient - recently noted x couple of weeks. Denies medication changes or diet changes except roughage increase. Still on omeprazle.   GI Review of Systems    Reports abdominal pain.     Location of  Abdominal pain: lower abdomen.    Denies acid reflux, belching, bloating, chest pain, dysphagia with liquids, dysphagia with solids, heartburn, loss of appetite, nausea, vomiting, vomiting blood, weight loss, and  weight gain.      Reports constipation and  rectal bleeding.     Denies anal fissure, black tarry stools, change in bowel habit, diarrhea, diverticulosis, fecal incontinence, heme positive stool, hemorrhoids, irritable bowel syndrome, jaundice, light color stool, liver problems, and  rectal pain.    Prior Report Reviewed for Colonoscopy:  Findings: 07/21/2008 adenomatous polyps (2) int/ext hemorrhoids   Comments: 07/21/2008 prior polyps 1998 Repeat colonoscopy in 5 years.    Current Medications (verified): 1)  Prilosec 20 Mg  Cpdr (Omeprazole) .... Take 1 Tablet By Mouth Once A Day 2)  Estrogel 0.75 Mg/1.25 Gm (0.06%)  Gel  (Estradiol) .... Use Qd 3)  Vitamin D3 1000 Unit  Tabs (Cholecalciferol) .Marland Kitchen.. 1  By Mouth Daily 4)  Claritin 10 Mg  Caps (Loratadine) .... As Needed 5)  Symbicort 80-4.5 Mcg/act  Aero (Budesonide-Formoterol Fumarate) .... Two Puffs First Thing in Am and Then Again in Evening 6)  Ventolin Hfa 108 (90 Base) Mcg/act  Aers (Albuterol Sulfate) .Marland Kitchen.. 1-2 Puffs Every 4-6 Hours As Needed 7)  Anusol-Hc 2.5 % Crea (Hydrocortisone) .... Once Daily- Two Times A Day Prn 8)  Premarin 0.625 Mg/gm Crea (Estrogens, Conjugated) .... 3 Times Weekly 9)  Anusol-Hc 25 Mg Supp (Hydrocortisone Acetate) .Marland Kitchen.. 1 Pr Bid 10)  Miralax  Powd (Polyethylene Glycol 3350) .Marland Kitchen.. 1 Scoop Daily As Needed  Allergies (verified): 1)  ! * Novocaine 2)  ! * Shellfish 3)  * Statins  Past History:  Past Medical History: Asthma Dr Sherene Sires   - PFT 02/05/08 FEV1 62%, ratio of 60%, with 12% improvement after bronchodilator Colonic polyps, hx of adenomas (small)1998 and 201...Marland KitchenMarland KitchenGessner GERD Osteoarthritis Osteopenia PAC L breast cyst Vit D def GI Dr Leone Payor - colon 2010 Gyn   Dr Rosalio Macadamia Card Dr Eden Emms Hemorrhoids  Past Surgical History: Reviewed history from 10/30/2007 and no changes required. Hysterectomy complete Appendectomy Tonsillectomy Breast cysts  Family History: Reviewed history from 07/16/2008 and no changes required. Family History Hypertension Family History of Diabetes:M 29  No FH of Colon Cancer: no family history of atopy or respiratory disease  Social History: Reviewed history from 10/30/2007 and no changes required. 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 Lungs:  Clear throughout to auscultation. Heart:  Regular rate and rhythm; no murmurs, rubs,  or bruits. Abdomen:  soft and nontender without masses Rectal:  Female staff present external tags otherwise anoderm normal mild ansl stenosis Additional Exam:  ANOSCOPY: internal/external hemorrhoids, slight fresh heme   Impression & Recommendations:  Problem # 1:  HEMORRHOIDS, WITH BLEEDING (ICD-455.8) Assessment Deteriorated continue curent tx as is better HC supps nightly x 1 week more than as needed if persistent [facility] eval  Problem # 2:  GERD (ICD-530.81) Assessment: Deteriorated change to lansoprazole sounds like diet is compliant off citrus etc  Problem # 3:  ANEMIA, MILD (ICD-285.9) Assessment: New  Orders: TLB-CBC Platelet - w/Differential (85025-CBCD)  Patient Instructions: 1)  Please go to the basement to have your lab tests drawn today.  2)  Please pick up your medications at your pharmacy.  3)  Continuue using the hydrocortisone suppositories for one more week, then as needed. 4)  Continue other medications as prescribed. 5)  Copy sent to : Sonda Primes, MD 6)  The medication list was reviewed and reconciled.  All changed / newly prescribed medications were explained.  A complete medication list was provided to the patient / caregiver.  Call from pharmacist, Lansoprazole is not covered.  Change to pantoprazole. Francee Piccolo CMA Duncan Dull)  October 17, 2009 11:44 AM  Prescriptions: PANTOPRAZOLE SODIUM 40 MG TBEC (PANTOPRAZOLE SODIUM) Take 1 tablet by mouth once a day  #30 x 11   Entered by:   Francee Piccolo CMA (AAMA)   Authorized by:   Iva Boop MD, [facility]   Signed by:   Francee Piccolo CMA (AAMA) on 10/17/2009   Method used:   Electronically to        [facility] [id number] (retail)       [address] [place], [place]  [address]        Ph: [phone]       Fax: [phone]   RxID:   [id number] LANSOPRAZOLE 30 MG CPDR (LANSOPRAZOLE) 1 by mouth 30-60 minutes before breakfast  #30 x 11   Entered and Authorized by:   Iva Boop MD, [facility]   Signed by:   Iva Boop MD, FACG on 10/17/2009   Method used:   Electronically to        [facility] [id number] (retail)       [address]       [place], [place]  [address]       Ph: [phone]       Fax: [phone]   RxID:   [id number]

## 2010-08-08 NOTE — Assessment & Plan Note (Signed)
Summary: FU---D/T---STC   Vital Signs:  Patient profile:   75 year old female Weight:      134 pounds Temp:     97.4 degrees F oral Pulse rate:   83 / minute BP sitting:   134 / 84  (left arm)  Vitals Entered By: Tora Perches (October 05, 2009 10:42 AM) CC: f/u Is Patient Diabetic? No   Primary Care Provider:  Antron Seth  CC:  f/u.  History of Present Illness: C/o mild rectal bleeding off and on: there would be blood on toilet  paper  Preventive Screening-Counseling & Management  Alcohol-Tobacco     Smoking Status: never  Current Medications (verified): 1)  Prilosec 20 Mg  Cpdr (Omeprazole) .... Take 1 Tablet By Mouth Once A Day 2)  Estrogel 0.75 Mg/1.25 Gm (0.06%)  Gel (Estradiol) .... Use Qd 3)  Vitamin D3 1000 Unit  Tabs (Cholecalciferol) .Marland Kitchen.. 1  By Mouth Daily 4)  Claritin 10 Mg  Caps (Loratadine) .... As Needed 5)  Symbicort 80-4.5 Mcg/act  Aero (Budesonide-Formoterol Fumarate) .... Two Puffs First Thing in Am and Then Again in Evening 6)  Ventolin Hfa 108 (90 Base) Mcg/act  Aers (Albuterol Sulfate) .Marland Kitchen.. 1-2 Puffs Every 4-6 Hours As Needed 7)  Anusol-Hc 2.5 % Crea (Hydrocortisone) .... Once Daily- Two Times A Day Prn 8)  Premarin 0.625 Mg/gm Crea (Estrogens, Conjugated) .... 3 Times Weekly 9)  Anusol-Hc 25 Mg Supp (Hydrocortisone Acetate) .Marland Kitchen.. 1 Pr Bid 10)  Miralax  Powd (Polyethylene Glycol 3350) .Marland Kitchen.. 1 Scoop Daily As Needed  Allergies: 1)  ! * Novocaine 2)  ! * Shellfish 3)  * Statins  Past History:  Past Medical History: Last updated: 07/26/2009 Asthma Dr Sherene Sires   - PFT 02/05/08 FEV1 62%, ratio of 60%, with 12% improvement after bronchodilator Colonic polyps, hx of GERD Osteoarthritis Osteopenia PAC L breast cyst Vit D def GI Dr Leone Payor - colon 2010 Gyn   Dr Rosalio Macadamia Card Dr Eden Emms Hemorrhoids  Past Surgical History: Last updated: 10/30/2007 Hysterectomy complete Appendectomy Tonsillectomy Breast cysts  Social History: Last updated:  10/30/2007 Single Never Smoked Alcohol Use - yes Occupation: retired Child psychotherapist  Physical Exam  General:  Well-developed,well-nourished,in no acute distress; alert,appropriate and cooperative throughout examination Ears:  External ear exam shows no significant lesions or deformities.  Otoscopic examination reveals clear canals, tympanic membranes are intact bilaterally without bulging, retraction, inflammation or discharge. Hearing is grossly normal bilaterally. Nose:  External nasal examination shows no deformity or inflammation. Nasal mucosa are pink and moist without lesions or exudates. Mouth:  Oral mucosa and oropharynx without lesions or exudates.  Teeth in good repair. Neck:  No deformities, masses, or tenderness noted. Lungs:  Normal respiratory effort, chest expands symmetrically. Lungs are clear to auscultation, no crackles or wheezes. Heart:  Normal rate and regular rhythm. S1 and S2 normal without gallop, murmur, click, rub or other extra sounds. Abdomen:  Bowel sounds positive,abdomen soft and non-tender without masses, organomegaly or hernias noted. Msk:  No deformity or scoliosis noted of thoracic or lumbar spine.   Neurologic:  No cranial nerve deficits noted. Station and gait are normal. Plantar reflexes are down-going bilaterally. DTRs are symmetrical throughout. Sensory, motor and coordinative functions appear intact. Skin:  Intact without suspicious lesions or rashes Psych:  Cognition and judgment appear intact. Alert and cooperative with normal attention span and concentration. No apparent delusions, illusions, hallucinations   Impression & Recommendations:  Problem # 1:  HEMORRHOIDS, NOS (ICD-455.6) recurrent Assessment New Anusol hc  prn  Problem # 2:  HEMATOCHEZIA (ICD-578.1) recurrent Assessment: New Colon up to date Call if you are not better in a reasonable amount of time or if worse. Go to ER if feeling really bad!   Problem # 3:  OSTEOARTHRITIS  (ICD-715.90) Assessment: Comment Only  Complete Medication List: 1)  Prilosec 20 Mg Cpdr (Omeprazole) .... Take 1 tablet by mouth once a day 2)  Estrogel 0.75 Mg/1.25 Gm (0.06%) Gel (Estradiol) .... Use qd 3)  Vitamin D3 1000 Unit Tabs (Cholecalciferol) .Marland Kitchen.. 1  by mouth daily 4)  Claritin 10 Mg Caps (Loratadine) .... As needed 5)  Symbicort 80-4.5 Mcg/act Aero (Budesonide-formoterol fumarate) .... Two puffs first thing in am and then again in evening 6)  Ventolin Hfa 108 (90 Base) Mcg/act Aers (Albuterol sulfate) .Marland Kitchen.. 1-2 puffs every 4-6 hours as needed 7)  Anusol-hc 2.5 % Crea (Hydrocortisone) .... Once daily- two times a day prn 8)  Premarin 0.625 Mg/gm Crea (Estrogens, conjugated) .... 3 times weekly 9)  Anusol-hc 25 Mg Supp (Hydrocortisone acetate) .Marland Kitchen.. 1 pr bid 10)  Miralax Powd (Polyethylene glycol 3350) .Marland Kitchen.. 1 scoop daily as needed  Patient Instructions: 1)  Please schedule a follow-up appointment in 3-4 months well w/labs and Vit B12. 2)  Use flushable baby wipes 3)  Try Kefir Prescriptions: ANUSOL-HC 25 MG SUPP (HYDROCORTISONE ACETATE) 1 pr bid  #24 x 3   Entered and Authorized by:   Tresa Garter MD   Signed by:   Tresa Garter MD on 10/05/2009   Method used:   Print then Give to Patient   RxID:   [id number]

## 2010-08-08 NOTE — Procedures (Signed)
Summary: EGD: Normal   EGD  Procedure date:  10/31/2007  Findings:      Findings: Normal  Location: [facility]   Patient Name: Cindy Meza, Cindy Meza MRN: [id number] Procedure Procedures: Panendoscopy (EGD) CPT: 43235.    with [facility] Dilation of Esophagus Personnel: Endoscopist: Iva Boop, MD, [facility].  Referred By: Linda Hedges [name], MD.  Exam Location: Exam performed in Outpatient Clinic. Outpatient  Patient Consent: Procedure, Alternatives, Risks and Benefits discussed, consent obtained, from patient. Consent was obtained by the RN.  Indications Symptoms: Dysphagia. Abdominal pain, location: epigastric.  History  Current Medications: Patient is not currently taking Coumadin.  Allergies: Patient is allergic to NOVOCAINE, SHELLFISH.  Comments: DYSPHAGIA DESCRIBED AS FOOD SITTING IN CHEST X 3 MOS, ? RELATIONSHIP TO [facility]ON[facility]. ALSO WITH POST-PRANDIAL EPIGASTRIC PAIN Pre-Exam Physical: Performed Oct 31, 2007  Cardio-pulmonary exam, HEENT exam, Abdominal exam, Mental status exam WNL.  Comments: Pt. history reviewed/updated, physical exam performed prior to initiation of sedation? YES Exam Exam Info: Maximum depth of insertion Duodenum, intended Duodenum. Patient position: on left side. Gastric retroflexion performed. Images taken. ASA Classification: II. Tolerance: excellent.  Sedation Meds: Patient assessed and found to be appropriate for moderate (conscious) sedation. Fentanyl 50 mcg. given IV. Versed 6 mg. given IV. Cetacaine Spray 2 sprays given aerosolized.  Monitoring: BP and pulse monitoring done. Oximetry used. Supplemental O2 given  Findings - Normal: Proximal Esophagus to Duodenal 2nd Portion.  - Dilation: Distal Esophagus. for dysphagia without stricture. Maloney dilator used, Diameter: 54 F, Minimal Resistance, No Heme present on extraction. 1  total dilators used. Patient tolerance excellent.   Assessment  Comments: 1) NORMAL EGD,  CAUSE OF SYMPTOMS NOT SEEN BUT NON-EROSIVE GERD POSSIBLE AND SO IS OCCULT STRICTURE. 2) DO NOT SEE ANY PROBLEMS FROM [facility]  Events  Unplanned Intervention: No unplanned interventions were required.  Plans Instructions: Clear or full liquids: clears until 1 pm then soft.  Comments: CONTINUE PRILOSEC, IF PERSISTENT DIFFICULTY WOULD ADD ANTISPASMODIC AND/OR CHANGE PPI  IT IS REASONABLE TO RETRY [facility], DEFER TO DR. [name] Disposition: After procedure patient sent to recovery. After recovery patient sent home.  Scheduling: Follow-up prn.   CC:   Sonda Primes, MD  This report was created from the original endoscopy report, which was reviewed and signed by the above listed endoscopist.

## 2010-08-08 NOTE — Progress Notes (Signed)
Summary: OV TODAY  Phone Note Call from Patient Call back at Home Phone [phone]   Summary of Call: Patient is requesting a call back regarding an RX.  Initial call taken by: Lamar Sprinkles, CMA,  May 15, 2010 9:59 AM  Follow-up for Phone Call        Pt c/o productive cough w/clear mucus x 1 wk. No fever, body aches, chills, PND, sinus congestion or pressure. OTC Robitussin, cough drops & akaseltzer have given little relief. Scheduled office visit today for eval.  Follow-up by: Lamar Sprinkles, CMA,  May 15, 2010 10:15 AM

## 2010-08-08 NOTE — Progress Notes (Signed)
Summary: Triage-Hemorrhoids   Phone Note Call from Patient Call back at [facility] Phone [phone]   Caller: Patient Call For: Dr. Leone Payor Reason for Call: Talk to Nurse Summary of Call: hemorrhoids and abd pain... doesnt want to wait until May Initial call taken by: Vallarie Mare,  October 06, 2009 4:44 PM  Follow-up for Phone Call        Message left for patient to callback. Laureen Ochs LPN  October 08, 979 9:17 AM   Pt. c/o intermittent lower abd. discomfort for 1 month. Also increased gas and occ. constipation. Episodes of rectal bleeding, saw Dr.[name] 10-05-09, given Anusol Supp. No bleeding x3 days. Wants to be seen sooner than 1st available, only wants to see Dr.Aniyiah Zell.  1) Appt. w/Dr.Rhyse Skowron on 10-17-09 at 10:15am 2) Continue Anusol Supp.  3) Sitz baths QID, use baby wipes instead of TP. 4) Miralax 17gm mixed in 8oz. water or juice-daily.May use BID as needed 5) Gas-x,Phazyme, etc. as needed for gas & bloating. 6) If symptoms become worse call back immediately.  Follow-up by: Laureen Ochs LPN,  October 07, 2009 2:50 PM

## 2010-08-08 NOTE — Assessment & Plan Note (Signed)
Summary: cough >1wk/Sd   Vital Signs:  Patient profile:   75 year old female Menstrual status:  postmenopausal Height:      63 inches Weight:      133.75 pounds BMI:     23.78 O2 Sat:      95 % on Room air Temp:     98.1 degrees F oral Pulse rate:   98 / minute Pulse rhythm:   regular Resp:     16 per minute BP sitting:   130 / 74  (left arm) Cuff size:   regular  Vitals Entered By: Rock Nephew CMA (May 15, 2010 3:34 PM)  O2 Flow:  Room air CC: Patient c/o  chest congestion w/ cough , URI symptoms Is Patient Diabetic? No Pain Assessment Patient in pain? no       Does patient need assistance? Functional Status Self care Ambulation Normal     Menstrual Status postmenopausal   Primary Care Provider:  Sula Soda, MD   CC:  Patient c/o  chest congestion w/ cough  and URI symptoms.  History of Present Illness:  URI Symptoms      This is a 75 year old woman who presents with URI symptoms.  The symptoms began 1 week ago.  The severity is described as mild.  The patient reports sore throat and productive cough, but denies nasal congestion, clear nasal discharge, purulent nasal discharge, dry cough, earache, and sick contacts.  The patient denies fever, stiff neck, dyspnea, wheezing, rash, vomiting, diarrhea, use of an antipyretic, and response to antipyretic.  The patient denies itchy throat, sneezing, headache, muscle aches, and severe fatigue.  The patient denies the following risk factors for Strep sinusitis: unilateral facial pain, unilateral nasal discharge, poor response to decongestant, double sickening, tooth pain, Strep exposure, tender adenopathy, and absence of cough.    Preventive Screening-Counseling & Management  Alcohol-Tobacco     Alcohol drinks/day: 0     Smoking Status: never     Tobacco Counseling: not indicated; no tobacco use  Hep-HIV-STD-Contraception     Hepatitis Risk: no risk noted     HIV Risk: no risk noted     STD Risk: no risk  noted      Sexual History:  currently monogamous.        Drug Use:  never.        Blood Transfusions:  no.    Clinical Review Panels:  Prevention   Last Colonoscopy:  adenomatous polyps (2) int/ext hemorrhoids (07/21/2008)  Immunizations   Last Flu Vaccine:  Fluvax 3+ (04/27/2010)   Last Pneumovax:  Pneumovax (02/02/2010)   Last Zoster Vaccine:  Zostavax (10/28/2007)  Lipid Management   Cholesterol:  281 (01/27/2010)   LDL (bad choesterol):  DEL (12/31/2007)   HDL (good cholesterol):  130.50 (01/27/2010)  Diabetes Management   Creatinine:  0.8 (01/27/2010)   Last Flu Vaccine:  Fluvax 3+ (04/27/2010)   Last Pneumovax:  Pneumovax (02/02/2010)  CBC   WBC:  5.7 (01/27/2010)   RBC:  3.96 (01/27/2010)   Hgb:  12.9 (01/27/2010)   Hct:  37.8 (01/27/2010)   Platelets:  206.0 (01/27/2010)   MCV  95.4 (01/27/2010)   MCHC  34.1 (01/27/2010)   RDW  12.6 (01/27/2010)   PMN:  47.0 (01/27/2010)   Lymphs:  42.4 (01/27/2010)   Monos:  7.6 (01/27/2010)   Eosinophils:  2.4 (01/27/2010)   Basophil:  0.6 (01/27/2010)  Complete Metabolic Panel   Glucose:  96 (01/27/2010)   Sodium:  140 (01/27/2010)   Potassium:  4.5 (01/27/2010)   Chloride:  103 (01/27/2010)   CO2:  28 (01/27/2010)   BUN:  12 (01/27/2010)   Creatinine:  0.8 (01/27/2010)   Albumin:  4.0 (01/27/2010)   Total Protein:  7.4 (01/27/2010)   Calcium:  9.4 (01/27/2010)   Total Bili:  0.7 (01/27/2010)   Alk Phos:  48 (01/27/2010)   SGPT (ALT):  16 (01/27/2010)   SGOT (AST):  22 (01/27/2010)   Medications Prior to Update: 1)  Pantoprazole Sodium 40 Mg Tbec (Pantoprazole Sodium) .... Take 1 Tablet By Mouth Once A Day 2)  Estrogel 0.75 Mg/1.25 Gm (0.06%)  Gel (Estradiol) .... Use Qd 3)  Vitamin D3 1000 Unit  Tabs (Cholecalciferol) .Marland Kitchen.. 1  By Mouth Daily 4)  Claritin 10 Mg  Caps (Loratadine) .... As Needed 5)  Symbicort 80-4.5 Mcg/act  Aero (Budesonide-Formoterol Fumarate) .... Two Puffs First Thing in Am and Then Again  in Evening 6)  Ventolin Hfa 108 (90 Base) Mcg/act  Aers (Albuterol Sulfate) .Marland Kitchen.. 1-2 Puffs Every 4-6 Hours As Needed 7)  Anusol-Hc 2.5 % Crea (Hydrocortisone) .... Once Daily- Two Times A Day Prn 8)  Premarin 0.625 Mg/gm Crea (Estrogens, Conjugated) .... 3 Times Weekly 9)  Anusol-Hc 25 Mg Supp (Hydrocortisone Acetate) .Marland Kitchen.. 1 Pr Bid 10)  Miralax  Powd (Polyethylene Glycol 3350) .Marland Kitchen.. 1 Scoop Daily As Needed  Current Medications (verified): 1)  Pantoprazole Sodium 40 Mg Tbec (Pantoprazole Sodium) .... Take 1 Tablet By Mouth Once A Day 2)  Estrogel 0.75 Mg/1.25 Gm (0.06%)  Gel (Estradiol) .... Use Qd 3)  Vitamin D3 1000 Unit  Tabs (Cholecalciferol) .Marland Kitchen.. 1  By Mouth Daily 4)  Claritin 10 Mg  Caps (Loratadine) .... As Needed 5)  Symbicort 80-4.5 Mcg/act  Aero (Budesonide-Formoterol Fumarate) .... Two Puffs First Thing in Am and Then Again in Evening 6)  Ventolin Hfa 108 (90 Base) Mcg/act  Aers (Albuterol Sulfate) .Marland Kitchen.. 1-2 Puffs Every 4-6 Hours As Needed 7)  Anusol-Hc 2.5 % Crea (Hydrocortisone) .... Once Daily- Two Times A Day Prn 8)  Premarin 0.625 Mg/gm Crea (Estrogens, Conjugated) .... 3 Times Weekly 9)  Anusol-Hc 25 Mg Supp (Hydrocortisone Acetate) .Marland Kitchen.. 1 Pr Bid 10)  Miralax  Powd (Polyethylene Glycol 3350) .Marland Kitchen.. 1 Scoop Daily As Needed 11)  Mytussin Ac 100-10 Mg/28ml Syrp (Guaifenesin-Codeine) .... 5-10 Ml By Mouth Qid As Needed For Cough  Allergies (verified): 1)  ! * Novocaine 2)  ! * Shellfish 3)  * Statins  Past History:  Past Medical History: Last updated: 10/17/2009 Asthma Dr Sherene Sires   - PFT 02/05/08 FEV1 62%, ratio of 60%, with 12% improvement after bronchodilator Colonic polyps, hx of adenomas (small)1998 and 201...Marland KitchenMarland KitchenGessner GERD Osteoarthritis Osteopenia PAC L breast cyst Vit D def GI Dr Leone Payor - colon 2010 Gyn   Dr Rosalio Macadamia Card Dr Eden Emms Hemorrhoids  Past Surgical History: Last updated: 10/30/2007 Hysterectomy complete Appendectomy Tonsillectomy Breast  cysts  Family History: Last updated: 07/16/2008 Family History Hypertension Family History of Diabetes:M 90  No FH of Colon Cancer: no family history of atopy or respiratory disease  Social History: Last updated: 10/17/2009 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 Family History Hypertension Family History of Diabetes:M 49  No FH of Colon Cancer: no family history of atopy or respiratory disease  Social History: Reviewed history from 10/17/2009 and no changes required. 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  The patient denies anorexia, fever, weight loss, weight gain, chest pain, syncope, dyspnea on exertion, peripheral edema, headaches, hemoptysis, abdominal pain, hematuria, suspicious skin lesions, enlarged lymph nodes, and angioedema.    Physical Exam  General:  Well-developed,well-nourished,in no acute distress; alert,appropriate and cooperative throughout examination Head:  Normocephalic and atraumatic without obvious abnormalities. No apparent alopecia or balding. Mouth:  Oral mucosa and oropharynx without lesions or exudates.  Teeth in good repair. Neck:  No deformities, masses, or tenderness noted. Lungs:  normal respiratory effort, no intercostal retractions, no accessory muscle use, normal breath sounds, no dullness, no fremitus, no crackles, and no wheezes.   Heart:  normal rate, regular rhythm, no murmur, no gallop, no rub, and no JVD.   Abdomen:  soft, non-tender, normal bowel sounds, no distention, no masses, no guarding, no rigidity, no  rebound tenderness, no abdominal hernia, no inguinal hernia, no hepatomegaly, and no splenomegaly.   Msk:  No deformity or scoliosis noted of thoracic or lumbar spine.   Pulses:  R and L carotid,radial,femoral,dorsalis pedis and posterior tibial pulses are full and equal bilaterally Extremities:  No clubbing, cyanosis, edema, or deformity noted with normal full range of motion of all joints.   Neurologic:  No cranial nerve deficits noted. Station and gait are normal. Plantar reflexes are down-going bilaterally. DTRs are symmetrical throughout. Sensory, motor and coordinative functions appear intact. Skin:  turgor normal, color normal, no rashes, no suspicious lesions, no ecchymoses, no petechiae, no purpura, no ulcerations, and no edema.   Cervical Nodes:  no anterior cervical adenopathy and no posterior cervical adenopathy.   Axillary Nodes:  no R axillary adenopathy and no L axillary adenopathy.   Inguinal Nodes:  no R inguinal adenopathy and no L inguinal adenopathy.   Psych:  Cognition and judgment appear intact. Alert and cooperative with normal attention span and concentration. No apparent delusions, illusions, hallucinations   Impression & Recommendations:  Problem # 1:  COUGH (ICD-786.2) Assessment New  Orders: T-2 View CXR (71020TC)  Problem # 2:  URI (ICD-465.9) Assessment: New this sounds viral so no antibiotics are prescribed Her updated medication list for this problem includes:    Claritin 10 Mg Caps (Loratadine) .Marland Kitchen... As needed    Mytussin Ac 100-10 Mg/97ml Syrp (Guaifenesin-codeine) .Marland Kitchen... 5-10 ml by mouth qid as needed for cough  Complete Medication List: 1)  Pantoprazole Sodium 40 Mg Tbec (Pantoprazole sodium) .... Take 1 tablet by mouth once a day 2)  Estrogel 0.75 Mg/1.25 Gm (0.06%) Gel (Estradiol) .... Use qd 3)  Vitamin D3 1000 Unit Tabs (Cholecalciferol) .Marland Kitchen.. 1  by mouth daily 4)  Claritin 10 Mg Caps (Loratadine) .... As needed 5)  Symbicort 80-4.5 Mcg/act Aero  (Budesonide-formoterol fumarate) .... Two puffs first thing in am and then again in evening 6)  Ventolin Hfa 108 (90 Base) Mcg/act Aers (Albuterol sulfate) .Marland Kitchen.. 1-2 puffs every 4-6 hours as needed 7)  Anusol-hc 2.5 % Crea (Hydrocortisone) .... Once daily- two times a day prn 8)  Premarin 0.625 Mg/gm Crea (Estrogens, conjugated) .... 3 times weekly 9)  Anusol-hc 25 Mg Supp (Hydrocortisone acetate) .Marland Kitchen.. 1 pr bid 10)  Miralax Powd (Polyethylene glycol 3350) .Marland KitchenMarland KitchenMarland Kitchen  1 scoop daily as needed 11)  Mytussin Ac 100-10 Mg/46ml Syrp (Guaifenesin-codeine) .... 5-10 ml by mouth qid as needed for cough  Patient Instructions: 1)  Please schedule a follow-up appointment in 2 weeks. 2)  Get plenty of rest, drink lots of clear liquids, and use Tylenol or Ibuprofen for fever and comfort. Return in 7-10 days if you're not better:sooner if you're feeling worse. Prescriptions: MYTUSSIN AC 100-10 MG/5ML SYRP (GUAIFENESIN-CODEINE) 5-10 ml by mouth QID as needed for cough  #8 ounces x 0   Entered and Authorized by:   Etta Grandchild MD   Signed by:   Etta Grandchild MD on 05/15/2010   Method used:   Print then Give to Patient   RxID:   [id number]    Orders Added: 1)  T-2 View CXR [71020TC] 2)  Est. Patient Level IV [14782]

## 2010-08-11 ENCOUNTER — Ambulatory Visit (INDEPENDENT_AMBULATORY_CARE_PROVIDER_SITE_OTHER): Payer: 59 | Admitting: Internal Medicine

## 2010-08-11 ENCOUNTER — Ambulatory Visit: Admit: 2010-08-11 | Payer: Self-pay | Admitting: Internal Medicine

## 2010-08-11 ENCOUNTER — Encounter: Payer: Self-pay | Admitting: Internal Medicine

## 2010-08-11 DIAGNOSIS — J45909 Unspecified asthma, uncomplicated: Secondary | ICD-10-CM

## 2010-08-11 DIAGNOSIS — K219 Gastro-esophageal reflux disease without esophagitis: Secondary | ICD-10-CM

## 2010-08-24 NOTE — Assessment & Plan Note (Signed)
Summary: 6 MO RV /NWS   Vital Signs:  Patient profile:   75 year old female Menstrual status:  postmenopausal Height:      63 inches Weight:      133 pounds BMI:     23.65 Temp:     97.6 degrees F oral Pulse rate:   72 / minute Pulse rhythm:   regular Resp:     16 per minute BP sitting:   140 / 80  (left arm) Cuff size:   regular  Vitals Entered By: Lanier Prude, CMA(AAMA) (August 11, 2010 11:16 AM) CC: 6 mo f/u  Comments pt is not taking Mytussin   Primary Care Provider:  Sula Soda, MD   CC:  6 mo f/u .  History of Present Illness: F/u asthma, GERD  Current Medications (verified): 1)  Pantoprazole Sodium 40 Mg Tbec (Pantoprazole Sodium) .... Take 1 Tablet By Mouth Once A Day 2)  Estrogel 0.75 Mg/1.25 Gm (0.06%)  Gel (Estradiol) .... Use Qd 3)  Vitamin D3 1000 Unit  Tabs (Cholecalciferol) .Marland Kitchen.. 1  By Mouth Daily 4)  Claritin 10 Mg  Caps (Loratadine) .... As Needed 5)  Symbicort 80-4.5 Mcg/act  Aero (Budesonide-Formoterol Fumarate) .... Two Puffs First Thing in Am and Then Again in Evening 6)  Ventolin Hfa 108 (90 Base) Mcg/act  Aers (Albuterol Sulfate) .Marland Kitchen.. 1-2 Puffs Every 4-6 Hours As Needed 7)  Anusol-Hc 2.5 % Crea (Hydrocortisone) .... Once Daily- Two Times A Day Prn 8)  Premarin 0.625 Mg/gm Crea (Estrogens, Conjugated) .... 3 Times Weekly 9)  Anusol-Hc 25 Mg Supp (Hydrocortisone Acetate) .Marland Kitchen.. 1 Pr Bid 10)  Miralax  Powd (Polyethylene Glycol 3350) .Marland Kitchen.. 1 Scoop Daily As Needed 11)  Mytussin Ac 100-10 Mg/25ml Syrp (Guaifenesin-Codeine) .... 5-10 Ml By Mouth Qid As Needed For Cough  Allergies (verified): 1)  ! * Novocaine 2)  ! 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    Physical Exam  General:  Well-developed,well-nourished,in no acute distress; alert,appropriate and cooperative throughout examination Eyes:  No corneal or conjunctival inflammation noted. EOMI. Perrla. Mouth:  Oral mucosa and oropharynx without lesions or exudates.  Teeth in good repair. Neck:  No deformities, masses, or tenderness noted. Lungs:  normal respiratory effort, no intercostal retractions, no accessory muscle use, normal breath sounds, no dullness, no fremitus, no crackles, and no wheezes.   Heart:  normal rate, regular rhythm, no murmur, no gallop, no rub, and no JVD.   Abdomen:  soft, non-tender, normal bowel sounds, no distention, no masses, no guarding, no rigidity, no rebound tenderness, no abdominal hernia, no inguinal hernia, no hepatomegaly, and no splenomegaly.   Msk:  No deformity or scoliosis noted of thoracic or lumbar spine.   Extremities:  No clubbing, cyanosis, edema, or deformity noted with normal full range of motion of all joints.   Neurologic:  No cranial nerve deficits noted. Station and gait are normal. Plantar reflexes are down-going  bilaterally. DTRs are symmetrical throughout. Sensory, motor and coordinative functions appear intact. Skin:  turgor normal, color normal, no rashes, no suspicious lesions, no ecchymoses, no petechiae, no purpura, no ulcerations, and no edema.   Psych:  Cognition and judgment appear intact. Alert and cooperative with normal attention span and concentration. No apparent delusions, illusions, hallucinations   Impression & Recommendations:  Problem # 1:  ASTHMA (ICD-493.90) Assessment Improved  Her updated medication  list for this problem includes:    Symbicort 80-4.5 Mcg/act Aero (Budesonide-formoterol fumarate) .Marland Kitchen..Marland Kitchen Two puffs first thing in am and then again in evening    Ventolin Hfa 108 (90 Base) Mcg/act Aers (Albuterol sulfate) .Marland Kitchen... 1-2 puffs every 4-6 hours as needed  Problem # 2:  GERD (ICD-530.81) Assessment: Improved  The following medications were removed from the medication list:    Pantoprazole Sodium 40 Mg Tbec (Pantoprazole sodium) .Marland Kitchen... Take 1 tablet by mouth once a day Her updated medication list for this problem includes:    Pantoprazole Sodium 20 Mg Tbec (Pantoprazole sodium) .Marland Kitchen... 1 by mouth qd  Complete Medication List: 1)  Estrogel 0.75 Mg/1.25 Gm (0.06%) Gel (Estradiol) .... Use qd 2)  Vitamin D3 1000 Unit Tabs (Cholecalciferol) .Marland Kitchen.. 1  by mouth daily 3)  Claritin 10 Mg Caps (Loratadine) .... As needed 4)  Symbicort 80-4.5 Mcg/act Aero (Budesonide-formoterol fumarate) .... Two puffs first thing in am and then again in evening 5)  Ventolin Hfa 108 (90 Base) Mcg/act Aers (Albuterol sulfate) .Marland Kitchen.. 1-2 puffs every 4-6 hours as needed 6)  Anusol-hc 2.5 % Crea (Hydrocortisone) .... Once daily- two times a day prn 7)  Premarin 0.625 Mg/gm Crea (Estrogens, conjugated) .... 3 times weekly 8)  Anusol-hc 25 Mg Supp (Hydrocortisone acetate) .Marland Kitchen.. 1 pr bid 9)  Miralax Powd (Polyethylene glycol 3350) .Marland Kitchen.. 1 scoop daily as needed 10)  Mytussin Ac 100-10 Mg/27ml Syrp (Guaifenesin-codeine) .... 5-10 ml by mouth qid as needed for cough 11)  Pantoprazole Sodium 20 Mg Tbec (Pantoprazole sodium) .Marland Kitchen.. 1 by mouth qd  Patient Instructions: 1)  Please schedule a follow-up appointment in 6 months well w/labs. Prescriptions: PREMARIN 0.625 MG/GM CREA (ESTROGENS, CONJUGATED) 3 times weekly  #1 tube x 4   Entered and Authorized by:   Tresa Garter MD   Signed by:   Tresa Garter MD on 08/11/2010   Method used:   Print then Give to Patient   RxID:   [id number] PANTOPRAZOLE SODIUM 20 MG  TBEC (PANTOPRAZOLE SODIUM) 1 by mouth qd  #30 x 11   Entered and Authorized by:   Tresa Garter MD   Signed by:   Tresa Garter MD on 08/11/2010   Method used:   Print then Give to Patient   RxID:   [id number]    Orders Added: 1)  Est. Patient Level III [42595]

## 2010-09-06 ENCOUNTER — Encounter: Payer: Self-pay | Admitting: Internal Medicine

## 2010-10-17 ENCOUNTER — Other Ambulatory Visit: Payer: Self-pay | Admitting: Internal Medicine

## 2010-11-09 ENCOUNTER — Other Ambulatory Visit: Payer: Self-pay | Admitting: *Deleted

## 2010-11-09 NOTE — Telephone Encounter (Signed)
rec fax from pharm stating Symbicort 80-4.5 is not covered. Please choose preferred alt: Asmanex QVAR Advair diskus Advair HFA  OR contact ins for PA. Fax back to [phone]

## 2010-11-10 NOTE — Telephone Encounter (Signed)
advair - addressed

## 2010-11-11 ENCOUNTER — Other Ambulatory Visit: Payer: Self-pay | Admitting: Internal Medicine

## 2010-11-13 ENCOUNTER — Telehealth: Payer: Self-pay | Admitting: *Deleted

## 2010-11-13 MED ORDER — FLUTICASONE-SALMETEROL 100-50 MCG/DOSE IN AEPB: 1.0000 | INHALATION_SPRAY | Freq: Two times a day (BID) | RESPIRATORY_TRACT | Status: DC

## 2010-11-13 NOTE — Telephone Encounter (Signed)
OK Advair Thx

## 2010-11-13 NOTE — Telephone Encounter (Signed)
rec fax stating Symbicort is not covered. Please consider preferred alt: QVAR, Advair Diskus and Advair HFA    OR call [employer] for PA [phone]

## 2010-11-14 NOTE — Telephone Encounter (Signed)
New rx sent

## 2010-11-21 NOTE — Assessment & Plan Note (Signed)
[facility]                           PRIMARY CARE OFFICE NOTE   Cindy Meza, Cindy Meza                      MRN:          [id number]  DATE:12/31/2006                            DOB:          26-Sep-1935    The patient is an 75 year old who presents for a wellness examination.   ALLERGIES:  Reviewed.   PAST MEDICAL HISTORY:  As per December 27, 2005 note.   FAMILY HISTORY:  As per December 27, 2005 note.   SOCIAL HISTORY:  As per December 27, 2005 note.   REVIEW OF SYSTEMS:  Six weeks ago she developed a sore throat, runny  nose, itchy eyes, used some Claritin with good success.  The ears felt  stuffed up.  There was yellow mucus.  No headache.  No chest pain or  shortness of breath.  The rest of the 18-point review of systems is  negative.   PHYSICAL EXAMINATION:  VITAL SIGNS:  Blood pressure 136/85, pulse 68,  temp 97, weight 142 pounds.  GENERAL:  She looks well.  HEENT:  Moist mucosa. Erythematous nasal lining.  LUNGS:  Clear.  No wheezes or rales.  HEART:  S1 S2.  No murmur, rub, gallop.  ABDOMEN:  Soft nontender.  No organomegaly, mass felt.  NECK:  Thyroid was without enlargement.  No bruit.  LOWER EXTREMITIES:  Without edema.  SKIN:  Clear.   ASSESSMENT/PLAN:  1. Normal wellness examination.  Age/health-related issues discussed.      Healthy lifestyle  discussed.  We will obtain lab work appropriate      for age.  Zostavax in a month.  Mammogram had two weeks ago.      Tetanus shot advised.  2. Right knee osteoarthritis with swelling.  Arthroscopic surgery was      advised by her orthopedist.  3. Edema of the ankles with NSAIDs, most likely try Tylenol or a low      dose Aleve instead p.r.n. pain.  4. Elevated blood pressure.  We will watch could be related to NSAIDs.  5. Dyslipidemia.  Given a prescription for Lovasa to take 2 capsules      twice a day.  We will obtain lab work in 6 months.     [name]. [name], MD  Electronically  Signed    [employer]  DD: 01/02/2007  DT: 01/02/2007  Job #: [id number]

## 2010-11-24 NOTE — Assessment & Plan Note (Signed)
[facility]                            CARDIOLOGY OFFICE NOTE   Cindy Meza, Cindy Meza                      MRN:          [id number]  DATE:09/12/2006                            DOB:          March 08, 1936    Cindy Meza is seen as a new patient today.  Referred by Dr. Posey Rea.  She has had a longstanding history of palpitations and PACs.  On her  ECG, she has no documented sustained arrhythmias or atrial fibrillation.   The patient had a 2D echocardiogram performed on August 27, 2006.  It  was read by Dr. Diona Browner.  The ejection fraction was estimated at 50% to  55% with inferior wall hypokinesis.   Apparently, the patient had complained about some lightheadedness and  chronic palpitations, and there was a question of a murmur.   In talking to the patient, she has never had syncope.  She has not had  chest pain.  She has not had recent stress test.  There has been on  clinical history to suggest angina.   The patient is retired, but she is fairly active.  There has been no  exertional dyspnea.   Looking back through her old EKG's, she clearly has had runs of PAC's in  the past.  I did not really see a lot of ventricular ectopy.   The patient is concerned, and would like to know if her skips are risky,  or would portend any other structural heart problems.   REVIEW OF SYSTEMS:  Otherwise, remarkable for some occasional swelling  in her ankles.   She is not allergic to x-ray dye.  She has had an MRI of her knee  before, and does not have any metal in her body.   SHE IS ALLERGIC TO:  1. SHELLFISH.  2. NOVOCAIN.   She smoked for a very little while 40 years ago.  She drinks socially.  She has elevated cholesterol.  She walks on a regular basis.   She is a retired Child psychotherapist, and is busy on [employer] around town,  including her [employer] and [facility] [employer].  She has had a previous  tonsillectomy, lipoma removal from her hip, a cyst removal  from her  breast, hysterectomy, and appendectomy.  She is not married, and has no  children.  Her mother is alive at age 64.  Father is passed at age 40.   She is on:  1. Actonel 35 a day.  2. Multivitamins.  3. Prilosec 20 a day.  4. Claritin 10 a day.  5. Aspirin a day.  6. Estradiol Gel.  7. Albuterol p.r.n.   Her lab, which was performed by Dr. Posey Rea on March 6th, was fairly  unremarkable.  She did not have a cholesterol performed at that time.   EXAMINATION:  She has frequent PACs.  The blood pressure is 128/80.  HEENT:  Normal.  There is no thyromegaly.  No lymphadenopathy.  LUNGS:  Clear.  There is an S1 and S2 with a soft systolic murmur, which I do not think  is significant.  ABDOMEN:  Benign.  EXTREMITIES:  Intact pulses.  No edema.   Her thyroid studies were normal.   IMPRESSION:  Chronic premature atrial contractions.  Minimal  palpitations in the setting of an abnormal echocardiogram.   The patient's electrocardiogram was reviewed, and I do not see any old  myocardial infarctions on it.  She has somewhat prominent voltage in  lead 2.  I think for the time being there are 2 issues, the first, what  exactly is the patient's heart function, and does she have decreased LV  function with a wall motion abnormality?  I think the best test for this  would be a cardiac MRI.  The intrinsic signals from within the body will  not be prone to any artifact, and we can quantitate her EF and see for  sure whether or not there is wall motion abnormality with a scar.  I  think this is important in regards to her arrhythmia.   Furthermore, I think she that she needs coronary disease ruled out, if  indeed she does have decreased LV function, making sure that there is no  exercise-induced arrhythmia and/or ischemia is important.  She seems fit  enough to walk on a treadmill, and this physiologic data will be  important.   We will not change her medicines for the time being.   Her blood pressure  seems to be under good control.  If either of these tests are abnormal,  she will follow up with me.  However, hopefully she will not have a wall  motion abnormality or exercise-induced arrhythmias, and no evidence of  coronary disease.  If this is the case, she will follow up with Dr.  Posey Rea for continued medical care and risk factor modification.    Noralyn Pick. Eden Emms, MD, [facility]  Electronically Signed   PCN/MedQ  DD: 09/12/2006  DT: 09/12/2006  Job #: [id number]   cc:   [name]. [name], MD

## 2011-01-16 ENCOUNTER — Encounter: Payer: Self-pay | Admitting: Internal Medicine

## 2011-02-07 ENCOUNTER — Other Ambulatory Visit: Payer: 59

## 2011-02-07 ENCOUNTER — Other Ambulatory Visit: Payer: Self-pay | Admitting: Internal Medicine

## 2011-02-07 DIAGNOSIS — Z0000 Encounter for general adult medical examination without abnormal findings: Secondary | ICD-10-CM

## 2011-02-09 ENCOUNTER — Other Ambulatory Visit: Payer: Self-pay | Admitting: Internal Medicine

## 2011-02-09 ENCOUNTER — Other Ambulatory Visit (INDEPENDENT_AMBULATORY_CARE_PROVIDER_SITE_OTHER): Payer: 59

## 2011-02-09 DIAGNOSIS — J45909 Unspecified asthma, uncomplicated: Secondary | ICD-10-CM

## 2011-02-09 DIAGNOSIS — M949 Disorder of cartilage, unspecified: Secondary | ICD-10-CM

## 2011-02-09 DIAGNOSIS — Z79899 Other long term (current) drug therapy: Secondary | ICD-10-CM

## 2011-02-09 DIAGNOSIS — Z0000 Encounter for general adult medical examination without abnormal findings: Secondary | ICD-10-CM

## 2011-02-09 LAB — BASIC METABOLIC PANEL
BUN: 13 mg/dL (ref 6–23)
Chloride: 103 mEq/L (ref 96–112)
GFR: 93.09 mL/min (ref >60.00)
Glucose, Bld: 91 mg/dL (ref 70–99)
Potassium: 3.7 mEq/L (ref 3.5–5.1)
Sodium: 137 mEq/L (ref 135–145)

## 2011-02-09 LAB — URINALYSIS
Hgb urine dipstick: NEGATIVE
Ketones, ur: NEGATIVE
Leukocytes, UA: NEGATIVE
Urine Glucose: NEGATIVE
Urobilinogen, UA: 0.2 (ref 0.0–1.0)

## 2011-02-09 LAB — HEPATIC FUNCTION PANEL
ALT: 13 U/L (ref 0–35)
Total Protein: 7.5 g/dL (ref 6.0–8.3)

## 2011-02-09 LAB — CBC WITH DIFFERENTIAL/PLATELET
Eosinophils Relative: 2.7 % (ref 0.0–5.0)
Lymphocytes Relative: 39.4 % (ref 12.0–46.0)
Monocytes Relative: 8.6 % (ref 3.0–12.0)
Neutrophils Relative %: 48.7 % (ref 43.0–77.0)
Platelets: 280 10*3/uL (ref 150.0–400.0)
RBC: 3.91 Mil/uL (ref 3.87–5.11)
WBC: 6.1 10*3/uL (ref 4.5–10.5)

## 2011-02-09 LAB — LIPID PANEL
Cholesterol: 238 mg/dL — ABNORMAL HIGH (ref 0–200)
HDL: 137 mg/dL (ref >39.00)
Total CHOL/HDL Ratio: 2
VLDL: 13.6 mg/dL (ref 0.0–40.0)

## 2011-02-09 LAB — TSH: TSH: 2.23 u[IU]/mL (ref 0.35–5.50)

## 2011-02-09 LAB — LDL CHOLESTEROL, DIRECT: Direct LDL: 84.5 mg/dL

## 2011-02-14 ENCOUNTER — Ambulatory Visit (INDEPENDENT_AMBULATORY_CARE_PROVIDER_SITE_OTHER): Payer: 59 | Admitting: Internal Medicine

## 2011-02-14 ENCOUNTER — Encounter: Payer: Self-pay | Admitting: Internal Medicine

## 2011-02-14 DIAGNOSIS — Z136 Encounter for screening for cardiovascular disorders: Secondary | ICD-10-CM

## 2011-02-14 DIAGNOSIS — Z0000 Encounter for general adult medical examination without abnormal findings: Secondary | ICD-10-CM | POA: Insufficient documentation

## 2011-02-14 DIAGNOSIS — J45909 Unspecified asthma, uncomplicated: Secondary | ICD-10-CM

## 2011-02-14 DIAGNOSIS — M949 Disorder of cartilage, unspecified: Secondary | ICD-10-CM

## 2011-02-14 MED ORDER — ALBUTEROL SULFATE HFA 108 (90 BASE) MCG/ACT IN AERS: 1.0000 | INHALATION_SPRAY | Freq: Four times a day (QID) | RESPIRATORY_TRACT | Status: DC | PRN

## 2011-02-14 NOTE — Assessment & Plan Note (Signed)
Back on Actonel per Dr Limmie Patricia

## 2011-02-14 NOTE — Progress Notes (Signed)
  Subjective:    Patient ID: Cindy Meza, female    DOB: 29-Sep-1935, 75 y.o.   MRN: [id number]  HPI  The patient is here for annual Medicare wellness examination and management of other chronic and acute problems. immunizations.   Review of Systems  Constitutional: Negative for fever, chills, diaphoresis, activity change, appetite change, fatigue and unexpected weight change.  HENT: Negative for hearing loss, ear pain, congestion, sore throat, sneezing, mouth sores, neck pain, dental problem, voice change, postnasal drip and sinus pressure.   Eyes: Negative for pain and visual disturbance.  Respiratory: Negative for cough, chest tightness, wheezing and stridor.   Cardiovascular: Negative for chest pain, palpitations and leg swelling.  Gastrointestinal: Negative for nausea, vomiting, abdominal pain, blood in stool, abdominal distention and rectal pain.  Genitourinary: Negative for dysuria, hematuria, decreased urine volume, vaginal bleeding, vaginal discharge, difficulty urinating and vaginal pain.  Musculoskeletal: Negative for back pain, joint swelling and gait problem.  Skin: Negative for color change, rash and wound.  Neurological: Negative for dizziness, tremors, syncope, speech difficulty and light-headedness.  Hematological: Negative for adenopathy.  Psychiatric/Behavioral: Negative for suicidal ideas, hallucinations, behavioral problems, confusion, sleep disturbance, dysphoric mood and decreased concentration. The patient is not hyperactive.        Objective:   Physical Exam  Constitutional: She appears well-developed and well-nourished. No distress.  HENT:  Head: Normocephalic.  Right Ear: External ear normal.  Left Ear: External ear normal.  Nose: Nose normal.  Mouth/Throat: Oropharynx is clear and moist.  Eyes: Conjunctivae are normal. Pupils are equal, round, and reactive to light. Right eye exhibits no discharge. Left eye exhibits no discharge.  Neck: Normal range of  motion. Neck supple. No JVD present. No tracheal deviation present. No thyromegaly present.  Cardiovascular: Normal rate and normal heart sounds.        Irreg beats  Pulmonary/Chest: No stridor. No respiratory distress. She has no wheezes.  Abdominal: Soft. Bowel sounds are normal. She exhibits no distension and no mass. There is no tenderness. There is no rebound and no guarding.  Musculoskeletal: She exhibits no edema and no tenderness.  Lymphadenopathy:    She has no cervical adenopathy.  Neurological: She displays normal reflexes. No cranial nerve deficit. She exhibits normal muscle tone. Coordination normal.  Skin: No rash noted. No erythema.  Psychiatric: She has a normal mood and affect. Her behavior is normal. Judgment and thought content normal.   Lab Results  Component Value Date   WBC 6.1 02/09/2011   HGB 12.3 02/09/2011   HCT 37.2 02/09/2011   PLT 280.0 02/09/2011   CHOL 238* 02/09/2011   TRIG 68.0 02/09/2011   HDL 137.00 02/09/2011   LDLDIRECT 84.5 02/09/2011   ALT 13 02/09/2011   AST 22 02/09/2011   NA 137 02/09/2011   K 3.7 02/09/2011   CL 103 02/09/2011   CREATININE 0.8 02/09/2011   BUN 13 02/09/2011   CO2 28 02/09/2011   TSH 2.23 02/09/2011          Assessment & Plan:

## 2011-05-16 ENCOUNTER — Ambulatory Visit (INDEPENDENT_AMBULATORY_CARE_PROVIDER_SITE_OTHER): Payer: Medicare Other

## 2011-05-16 DIAGNOSIS — Z23 Encounter for immunization: Secondary | ICD-10-CM

## 2011-08-17 ENCOUNTER — Ambulatory Visit (INDEPENDENT_AMBULATORY_CARE_PROVIDER_SITE_OTHER): Payer: Medicare Other | Admitting: Internal Medicine

## 2011-08-17 ENCOUNTER — Encounter: Payer: Self-pay | Admitting: Internal Medicine

## 2011-08-17 DIAGNOSIS — M899 Disorder of bone, unspecified: Secondary | ICD-10-CM

## 2011-08-17 DIAGNOSIS — J45909 Unspecified asthma, uncomplicated: Secondary | ICD-10-CM

## 2011-08-17 DIAGNOSIS — M949 Disorder of cartilage, unspecified: Secondary | ICD-10-CM

## 2011-08-17 DIAGNOSIS — E559 Vitamin D deficiency, unspecified: Secondary | ICD-10-CM

## 2011-08-17 DIAGNOSIS — K219 Gastro-esophageal reflux disease without esophagitis: Secondary | ICD-10-CM

## 2011-08-17 MED ORDER — ESTRADIOL 0.75 MG/1.25 GM (0.06%) TD GEL: 1.2500 g | Freq: Every day | TRANSDERMAL | Status: DC

## 2011-08-17 NOTE — Patient Instructions (Signed)
Start a chair yoga class or silver sneakers class

## 2011-08-17 NOTE — Assessment & Plan Note (Signed)
Vit D 

## 2011-08-17 NOTE — Progress Notes (Signed)
  Subjective:    Patient ID: Cindy Meza, female    DOB: Nov 17, 1935, 76 y.o.   MRN: [id number]  HPI   The patient is here to follow up on chronic asthma, anxiety, osteopenia, headaches and chronic moderate fibromyalgia symptoms controlled with medicines, diet. C/o runny nose at times - bad    Review of Systems  Constitutional: Negative for chills, activity change, appetite change, fatigue and unexpected weight change.  HENT: Negative for congestion, mouth sores and sinus pressure.   Eyes: Negative for visual disturbance.  Respiratory: Positive for cough (rare). Negative for chest tightness.   Gastrointestinal: Negative for nausea and abdominal pain.  Genitourinary: Negative for frequency, difficulty urinating and vaginal pain.  Musculoskeletal: Negative for back pain and gait problem.  Skin: Negative for pallor and rash.  Neurological: Negative for dizziness, tremors, weakness, numbness and headaches.  Psychiatric/Behavioral: Negative for suicidal ideas, confusion and sleep disturbance. The patient is not nervous/anxious.        Objective:   Physical Exam  Constitutional: She appears well-developed.  HENT:  Head: Normocephalic.  Right Ear: External ear normal.  Left Ear: External ear normal.  Nose: Nose normal.  Mouth/Throat: Oropharynx is clear and moist.  Eyes: Conjunctivae are normal. Pupils are equal, round, and reactive to light. Right eye exhibits no discharge. Left eye exhibits no discharge.  Neck: Normal range of motion. Neck supple. No JVD present. No tracheal deviation present. No thyromegaly present.  Cardiovascular: Normal rate, regular rhythm and normal heart sounds.   Pulmonary/Chest: No stridor. No respiratory distress. She has no wheezes.  Abdominal: Soft. Bowel sounds are normal. She exhibits no distension and no mass. There is no tenderness. There is no rebound and no guarding.  Musculoskeletal: She exhibits no edema and no tenderness.  Lymphadenopathy:   She has no cervical adenopathy.  Neurological: She displays normal reflexes. No cranial nerve deficit. She exhibits normal muscle tone. Coordination normal.  Skin: No rash noted. No erythema.  Psychiatric: She has a normal mood and affect. Her behavior is normal. Judgment and thought content normal.          Assessment & Plan:

## 2011-08-17 NOTE — Assessment & Plan Note (Signed)
Continue with current prescription therapy as reflected on the Med list.  

## 2011-09-03 ENCOUNTER — Other Ambulatory Visit: Payer: Self-pay | Admitting: Internal Medicine

## 2011-09-12 ENCOUNTER — Encounter: Payer: Self-pay | Admitting: Internal Medicine

## 2011-09-12 ENCOUNTER — Ambulatory Visit (INDEPENDENT_AMBULATORY_CARE_PROVIDER_SITE_OTHER): Payer: Medicare Other | Admitting: Internal Medicine

## 2011-09-12 VITALS — BP 110/72 | HR 81 | Temp 97.7°F

## 2011-09-12 DIAGNOSIS — J309 Allergic rhinitis, unspecified: Secondary | ICD-10-CM | POA: Insufficient documentation

## 2011-09-12 DIAGNOSIS — K219 Gastro-esophageal reflux disease without esophagitis: Secondary | ICD-10-CM

## 2011-09-12 DIAGNOSIS — J029 Acute pharyngitis, unspecified: Secondary | ICD-10-CM

## 2011-09-12 MED ORDER — FLUTICASONE PROPIONATE 50 MCG/ACT NA SUSP: 2.0000 | Freq: Every day | NASAL | Status: DC

## 2011-09-12 MED ORDER — RANITIDINE HCL 150 MG PO TABS: 150.0000 mg | ORAL_TABLET | Freq: Two times a day (BID) | ORAL | Status: DC

## 2011-09-12 NOTE — Progress Notes (Signed)
  Subjective:    Patient ID: Cindy Meza, female    DOB: 05-30-1936, 76 y.o.   MRN: [id number]  Sore Throat  This is a new problem. The current episode started 1 to 4 weeks ago. The problem has been waxing and waning. Neither side of throat is experiencing more pain than the other. There has been no fever. The pain is mild. Associated symptoms include congestion, coughing and ear pain. Pertinent negatives include no drooling, ear discharge, headaches, shortness of breath, swollen glands, trouble swallowing or vomiting. She has had no exposure to strep or mono. She has tried nothing for the symptoms.   Past Medical History  Diagnosis Date  . Asthma     Dr. Sherene Sires  . History of colonic polyps Leone Payor    hx of adenomas (small) 1998  . GERD (gastroesophageal reflux disease)   . OA (osteoarthritis)   . Osteopenia   . PAC (premature atrial contraction)   . Breast cyst     Left  . Vitamin d deficiency   . Hemorrhoids   . Cataract     Dr. Nile Riggs     Review of Systems  HENT: Positive for ear pain and congestion. Negative for drooling, trouble swallowing and ear discharge.   Respiratory: Positive for cough. Negative for shortness of breath.   Gastrointestinal: Negative for vomiting.  Neurological: Negative for headaches.       Objective:   Physical Exam BP 110/72  Pulse 81  Temp(Src) 97.7 F (36.5 C) (Oral)  SpO2 96% Wt Readings from Last 3 Encounters:  08/17/11 130 lb (58.968 kg)  02/14/11 132 lb (59.875 kg)  08/11/10 133 lb (60.328 kg)   Constitutional: She appears well-developed and well-nourished. No distress.  HENT: Head: Normocephalic and atraumatic. Ears: B TMs ok, no erythema or effusion; Nose: Nose normal. Mouth/Throat: Oropharynx is clear and moist. Mildly red but no oropharyngeal exudate.  Eyes: Conjunctivae and EOM are normal. Pupils are equal, round, and reactive to light. No scleral icterus.  Neck: Normal range of motion. Neck supple. No JVD or LAD present. No  thyromegaly present.  Cardiovascular: Normal rate, regular rhythm and normal heart sounds.  No murmur heard. No BLE edema. Pulmonary/Chest: Effort normal and breath sounds normal. No respiratory distress. She has no wheezes.  Psychiatric: She has a normal mood and affect. Her behavior is normal. Judgment and thought content normal.   Lab Results  Component Value Date   WBC 6.1 02/09/2011   HGB 12.3 02/09/2011   HCT 37.2 02/09/2011   PLT 280.0 02/09/2011   GLUCOSE 91 02/09/2011   CHOL 238* 02/09/2011   TRIG 68.0 02/09/2011   HDL 137.00 02/09/2011   LDLDIRECT 84.5 02/09/2011   ALT 13 02/09/2011   AST 22 02/09/2011   NA 137 02/09/2011   K 3.7 02/09/2011   CL 103 02/09/2011   CREATININE 0.8 02/09/2011   BUN 13 02/09/2011   CO2 28 02/09/2011   TSH 2.23 02/09/2011       Assessment & Plan:  sore throat, chronic - will treat underlying allergy and reflux symptoms - see below. No evidence of infection or candidiasis

## 2011-09-12 NOTE — Assessment & Plan Note (Signed)
Add flonase spray to current meds

## 2011-09-12 NOTE — Patient Instructions (Signed)
It was good to see you today. To treat your sore throat, we will treat underlying allergies and reflux Stop Protonix and begin prescription Zantac twice daily for reflux Add daily Flonase spray for allergy and sinus symptoms Your prescription(s) have been submitted to your pharmacy. Please take as directed and contact our office if you believe you are having problem(s) with the medication(s). Followup with Dr. Leane Para in 6 weeks on sore throat, allergies and reflux, sooner if problems

## 2011-09-12 NOTE — Assessment & Plan Note (Signed)
On PPI for years and patient would like to wean off same We'll change instead prescription high-dose H2 blocker Zantac Followup with primary care physician in 4-6 weeks to review control of symptoms and further wean as tolerated

## 2011-11-09 ENCOUNTER — Other Ambulatory Visit: Payer: Self-pay | Admitting: *Deleted

## 2011-11-09 DIAGNOSIS — K219 Gastro-esophageal reflux disease without esophagitis: Secondary | ICD-10-CM

## 2011-11-09 MED ORDER — RANITIDINE HCL 150 MG PO TABS: 150.0000 mg | ORAL_TABLET | Freq: Two times a day (BID) | ORAL | Status: DC

## 2012-01-21 ENCOUNTER — Encounter: Payer: Self-pay | Admitting: Internal Medicine

## 2012-01-22 ENCOUNTER — Other Ambulatory Visit: Payer: Self-pay

## 2012-01-30 ENCOUNTER — Encounter: Payer: Self-pay | Admitting: Internal Medicine

## 2012-02-15 ENCOUNTER — Encounter: Payer: Self-pay | Admitting: Internal Medicine

## 2012-02-15 ENCOUNTER — Ambulatory Visit (INDEPENDENT_AMBULATORY_CARE_PROVIDER_SITE_OTHER): Payer: Medicare Other | Admitting: Internal Medicine

## 2012-02-15 VITALS — BP 130/72 | HR 72 | Temp 98.0°F | Resp 16 | Ht 63.0 in | Wt 126.0 lb

## 2012-02-15 DIAGNOSIS — K219 Gastro-esophageal reflux disease without esophagitis: Secondary | ICD-10-CM

## 2012-02-15 DIAGNOSIS — Z0000 Encounter for general adult medical examination without abnormal findings: Secondary | ICD-10-CM

## 2012-02-15 DIAGNOSIS — E559 Vitamin D deficiency, unspecified: Secondary | ICD-10-CM

## 2012-02-15 DIAGNOSIS — M899 Disorder of bone, unspecified: Secondary | ICD-10-CM

## 2012-02-15 DIAGNOSIS — M949 Disorder of cartilage, unspecified: Secondary | ICD-10-CM

## 2012-02-15 DIAGNOSIS — N951 Menopausal and female climacteric states: Secondary | ICD-10-CM

## 2012-02-15 DIAGNOSIS — J45909 Unspecified asthma, uncomplicated: Secondary | ICD-10-CM

## 2012-02-15 DIAGNOSIS — E785 Hyperlipidemia, unspecified: Secondary | ICD-10-CM

## 2012-02-15 DIAGNOSIS — Z136 Encounter for screening for cardiovascular disorders: Secondary | ICD-10-CM

## 2012-02-15 HISTORY — DX: Menopausal and female climacteric states: N95.1

## 2012-02-15 MED ORDER — RANITIDINE HCL 150 MG PO TABS: 150.0000 mg | ORAL_TABLET | Freq: Two times a day (BID) | ORAL | Status: DC

## 2012-02-15 MED ORDER — ALBUTEROL SULFATE HFA 108 (90 BASE) MCG/ACT IN AERS: 1.0000 | INHALATION_SPRAY | Freq: Four times a day (QID) | RESPIRATORY_TRACT | Status: DC | PRN

## 2012-02-15 NOTE — Assessment & Plan Note (Addendum)
Continue with current Vit D therapy as reflected on the Med list. She can use Prolia Start exercising

## 2012-02-15 NOTE — Assessment & Plan Note (Signed)
The patient is here for annual Medicare wellness examination and management of other chronic and acute problems.   The risk factors are reflected in the social history.  The roster of all physicians providing medical care to patient - is listed in the Snapshot section of the chart.  Activities of daily living:  The patient is 100% inedpendent in all ADLs: dressing, toileting, feeding as well as independent mobility  Home safety : The patient has smoke detectors in the home. They wear seatbelts.No firearms at home ( firearms are present in the home, kept in a safe fashion). There is no violence in the home.   There is no risks for hepatitis, STDs or HIV. There is no   history of blood transfusion. They have no travel history to infectious disease endemic areas of the world.  The patient has (has not) seen their dentist in the last six month. They have (not) seen their eye doctor in the last year. They deny (admit to) any hearing difficulty and have not had audiologic testing in the last year.  They do not  have excessive sun exposure. Discussed the need for sun protection: hats, long sleeves and use of sunscreen if there is significant sun exposure.   Diet: the importance of a healthy diet is discussed. They do have a healthy (unhealthy-high fat/fast food) diet.  The patient has no regular exercise program. The benefits of regular aerobic exercise were discussed.  Depression screen: there are no signs or vegative symptoms of depression- irritability, change in appetite, anhedonia, sadness/tearfullness.  Cognitive assessment: the patient manages all their financial and personal affairs and is actively engaged. They could relate day,date,year and events; recalled 3/3 objects at 3 minutes; performed clock-face test normally.  The following portions of the patient's history were reviewed and updated as appropriate: allergies, current medications, past family history, past medical history,  past  surgical history, past social history  and problem list.  Vision, hearing, body mass index were assessed and reviewed.   During the course of the visit the patient was educated and counseled about appropriate screening and preventive services including : fall prevention , diabetes screening, nutrition counseling, colorectal cancer screening, and recommended immunizations.   

## 2012-02-15 NOTE — Assessment & Plan Note (Signed)
Ongoing  D/c estrogens due to breast abnormalities 2013 Try Black cohosh po

## 2012-02-15 NOTE — Assessment & Plan Note (Signed)
Continue with current prescription therapy as reflected on the Med list.  

## 2012-02-15 NOTE — Progress Notes (Signed)
Subjective:    Patient ID: Cindy Meza, female    DOB: [birth year], 76 y.o.   MRN: [id number]  HPI  The patient is here for a wellness exam. The patient has been doing well overall without major physical or psychological issues going on lately. The patient needs to address  chronic asthma that has been well controlled with medicines; to address chronic  Hot flashes not controlled with medicines well; and to address GERD, controlled with medical treatment and diet.   BP Readings from Last 3 Encounters:  09/12/11 110/72  08/17/11 130/80  02/14/11 120/78   Wt Readings from Last 3 Encounters:  08/17/11 130 lb (58.968 kg)  02/14/11 132 lb (59.875 kg)  08/11/10 133 lb (60.328 kg)      Review of Systems  Constitutional: Negative for fever, chills, diaphoresis, activity change, appetite change, fatigue and unexpected weight change.  HENT: Negative for hearing loss, ear pain, congestion, sore throat, sneezing, mouth sores, neck pain, dental problem, voice change, postnasal drip and sinus pressure.   Eyes: Negative for pain and visual disturbance.  Respiratory: Negative for cough, chest tightness, wheezing and stridor.   Cardiovascular: Negative for chest pain, palpitations and leg swelling.  Gastrointestinal: Negative for nausea, vomiting, abdominal pain, blood in stool, abdominal distention and rectal pain.  Genitourinary: Negative for dysuria, hematuria, decreased urine volume, vaginal bleeding, vaginal discharge, difficulty urinating and vaginal pain.  Musculoskeletal: Negative for back pain, joint swelling and gait problem.  Skin: Negative for color change, rash and wound.  Neurological: Negative for dizziness, tremors, syncope, speech difficulty and light-headedness.  Hematological: Negative for adenopathy.  Psychiatric/Behavioral: Negative for suicidal ideas, hallucinations, behavioral problems, confusion, disturbed wake/sleep cycle, dysphoric mood and decreased concentration. The  patient is not hyperactive.        Objective:   Physical Exam  Constitutional: She appears well-developed and well-nourished. No distress.  HENT:  Head: Normocephalic.  Right Ear: External ear normal.  Left Ear: External ear normal.  Nose: Nose normal.  Mouth/Throat: Oropharynx is clear and moist.  Eyes: Conjunctivae are normal. Pupils are equal, round, and reactive to light. Right eye exhibits no discharge. Left eye exhibits no discharge.  Neck: Normal range of motion. Neck supple. No JVD present. No tracheal deviation present. No thyromegaly present.  Cardiovascular: Normal rate and normal heart sounds.        Irreg beats  Pulmonary/Chest: No stridor. No respiratory distress. She has no wheezes.  Abdominal: Soft. Bowel sounds are normal. She exhibits no distension and no mass. There is no tenderness. There is no rebound and no guarding.  Musculoskeletal: She exhibits no edema and no tenderness.  Lymphadenopathy:    She has no cervical adenopathy.  Neurological: She displays normal reflexes. No cranial nerve deficit. She exhibits normal muscle tone. Coordination normal.  Skin: No rash noted. No erythema.  Psychiatric: She has a normal mood and affect. Her behavior is normal. Judgment and thought content normal.   Lab Results  Component Value Date   WBC 6.1 02/09/2011   HGB 12.3 02/09/2011   HCT 37.2 02/09/2011   PLT 280.0 02/09/2011   CHOL 238* 02/09/2011   TRIG 68.0 02/09/2011   HDL 137.00 02/09/2011   LDLDIRECT 84.5 02/09/2011   ALT 13 02/09/2011   AST 22 02/09/2011   NA 137 02/09/2011   K 3.7 02/09/2011   CL 103 02/09/2011   CREATININE 0.8 02/09/2011   BUN 13 02/09/2011   CO2 28 02/09/2011   TSH 2.23 02/09/2011  Assessment & Plan:

## 2012-02-18 ENCOUNTER — Encounter: Payer: Self-pay | Admitting: Internal Medicine

## 2012-02-19 ENCOUNTER — Other Ambulatory Visit (INDEPENDENT_AMBULATORY_CARE_PROVIDER_SITE_OTHER): Payer: Medicare Other

## 2012-02-19 DIAGNOSIS — J45909 Unspecified asthma, uncomplicated: Secondary | ICD-10-CM

## 2012-02-19 DIAGNOSIS — E785 Hyperlipidemia, unspecified: Secondary | ICD-10-CM

## 2012-02-19 DIAGNOSIS — K219 Gastro-esophageal reflux disease without esophagitis: Secondary | ICD-10-CM

## 2012-02-19 DIAGNOSIS — Z0000 Encounter for general adult medical examination without abnormal findings: Secondary | ICD-10-CM

## 2012-02-19 LAB — CBC WITH DIFFERENTIAL/PLATELET
Basophils Relative: 0.9 % (ref 0.0–3.0)
Eosinophils Relative: 4.7 % (ref 0.0–5.0)
HCT: 37.5 % (ref 36.0–46.0)
Lymphs Abs: 2.7 10*3/uL (ref 0.7–4.0)
MCV: 94.5 fl (ref 78.0–100.0)
Monocytes Absolute: 0.6 10*3/uL (ref 0.1–1.0)
RBC: 3.97 Mil/uL (ref 3.87–5.11)
WBC: 5.9 10*3/uL (ref 4.5–10.5)

## 2012-02-19 LAB — TSH: TSH: 2.68 u[IU]/mL (ref 0.35–5.50)

## 2012-02-19 LAB — URINALYSIS
Specific Gravity, Urine: <=1.005 (ref 1.000–1.030)
Total Protein, Urine: NEGATIVE
Urine Glucose: NEGATIVE

## 2012-02-19 LAB — BASIC METABOLIC PANEL
Calcium: 9.1 mg/dL (ref 8.4–10.5)
Creatinine, Ser: 0.8 mg/dL (ref 0.4–1.2)
Sodium: 134 mEq/L — ABNORMAL LOW (ref 135–145)

## 2012-02-19 LAB — LIPID PANEL
HDL: 123.6 mg/dL (ref >39.00)
Total CHOL/HDL Ratio: 2
Triglycerides: 62 mg/dL (ref 0.0–149.0)
VLDL: 12.4 mg/dL (ref 0.0–40.0)

## 2012-02-19 LAB — HEPATIC FUNCTION PANEL
Alkaline Phosphatase: 46 U/L (ref 39–117)
Bilirubin, Direct: 0.1 mg/dL (ref 0.0–0.3)
Total Bilirubin: 0.8 mg/dL (ref 0.3–1.2)

## 2012-05-07 ENCOUNTER — Ambulatory Visit (INDEPENDENT_AMBULATORY_CARE_PROVIDER_SITE_OTHER): Payer: Medicare Other | Admitting: General Practice

## 2012-05-07 DIAGNOSIS — Z23 Encounter for immunization: Secondary | ICD-10-CM

## 2012-07-17 ENCOUNTER — Other Ambulatory Visit (INDEPENDENT_AMBULATORY_CARE_PROVIDER_SITE_OTHER): Payer: 59

## 2012-07-17 ENCOUNTER — Ambulatory Visit (INDEPENDENT_AMBULATORY_CARE_PROVIDER_SITE_OTHER): Payer: 59 | Admitting: Internal Medicine

## 2012-07-17 ENCOUNTER — Encounter: Payer: Self-pay | Admitting: Internal Medicine

## 2012-07-17 VITALS — BP 144/80 | HR 68 | Temp 97.4°F | Wt 125.0 lb

## 2012-07-17 DIAGNOSIS — K219 Gastro-esophageal reflux disease without esophagitis: Secondary | ICD-10-CM

## 2012-07-17 DIAGNOSIS — R42 Dizziness and giddiness: Secondary | ICD-10-CM

## 2012-07-17 DIAGNOSIS — R030 Elevated blood-pressure reading, without diagnosis of hypertension: Secondary | ICD-10-CM

## 2012-07-17 DIAGNOSIS — E559 Vitamin D deficiency, unspecified: Secondary | ICD-10-CM

## 2012-07-17 DIAGNOSIS — D539 Nutritional anemia, unspecified: Secondary | ICD-10-CM

## 2012-07-17 DIAGNOSIS — D649 Anemia, unspecified: Secondary | ICD-10-CM

## 2012-07-17 DIAGNOSIS — I951 Orthostatic hypotension: Secondary | ICD-10-CM | POA: Insufficient documentation

## 2012-07-17 DIAGNOSIS — J45909 Unspecified asthma, uncomplicated: Secondary | ICD-10-CM

## 2012-07-17 LAB — BASIC METABOLIC PANEL
BUN: 12 mg/dL (ref 6–23)
CO2: 28 mEq/L (ref 19–32)
Chloride: 104 mEq/L (ref 96–112)
Creatinine, Ser: 0.8 mg/dL (ref 0.4–1.2)
Glucose, Bld: 111 mg/dL — ABNORMAL HIGH (ref 70–99)

## 2012-07-17 LAB — CBC WITH DIFFERENTIAL/PLATELET
Basophils Relative: 0.7 % (ref 0.0–3.0)
Eosinophils Absolute: 0.2 10*3/uL (ref 0.0–0.7)
MCHC: 33.5 g/dL (ref 30.0–36.0)
MCV: 93.9 fl (ref 78.0–100.0)
Monocytes Absolute: 0.5 10*3/uL (ref 0.1–1.0)
Neutrophils Relative %: 47.5 % (ref 43.0–77.0)
Platelets: 250 10*3/uL (ref 150.0–400.0)
RDW: 12.6 % (ref 11.5–14.6)

## 2012-07-17 LAB — IBC PANEL
Iron: 74 ug/dL (ref 42–145)
Transferrin: 259.9 mg/dL (ref 212.0–360.0)

## 2012-07-17 LAB — VITAMIN D 25 HYDROXY (VIT D DEFICIENCY, FRACTURES): Vit D, 25-Hydroxy: 54 ng/mL (ref 30–89)

## 2012-07-17 MED ORDER — FLUTICASONE-SALMETEROL 115-21 MCG/ACT IN AERO: 2.0000 | INHALATION_SPRAY | Freq: Two times a day (BID) | RESPIRATORY_TRACT | Status: DC

## 2012-07-17 NOTE — Progress Notes (Signed)
  Subjective:    HPI  C/o lightheadedness  2 d - off and on, better today. No syncope, dizziness. C/o ST, cough.  The patient needs to address  chronic asthma that has been well controlled with medicines; to address chronic  Hot flashes not controlled with medicines well; and to address GERD, controlled with medical treatment and diet.   BP Readings from Last 3 Encounters:  07/17/12 144/80  02/15/12 130/72  09/12/11 110/72   Wt Readings from Last 3 Encounters:  07/17/12 125 lb (56.7 kg)  02/15/12 126 lb (57.153 kg)  08/17/11 130 lb (58.968 kg)      Review of Systems  Constitutional: Negative for fever, chills, diaphoresis, activity change, appetite change, fatigue and unexpected weight change.  HENT: Negative for hearing loss, ear pain, congestion, sore throat, sneezing, mouth sores, neck pain, dental problem, voice change, postnasal drip and sinus pressure.   Eyes: Negative for pain and visual disturbance.  Respiratory: Negative for cough, chest tightness, wheezing and stridor.   Cardiovascular: Negative for chest pain, palpitations and leg swelling.  Gastrointestinal: Negative for nausea, vomiting, abdominal pain, blood in stool, abdominal distention and rectal pain.  Genitourinary: Negative for dysuria, hematuria, decreased urine volume, vaginal bleeding, vaginal discharge, difficulty urinating and vaginal pain.  Musculoskeletal: Negative for back pain, joint swelling and gait problem.  Skin: Negative for color change, rash and wound.  Neurological: Negative for dizziness, tremors, syncope, speech difficulty and light-headedness.  Hematological: Negative for adenopathy.  Psychiatric/Behavioral: Negative for suicidal ideas, hallucinations, behavioral problems, confusion, sleep disturbance, dysphoric mood and decreased concentration. The patient is not hyperactive.        Objective:   Physical Exam  Constitutional: She appears well-developed and well-nourished. No distress.    HENT:  Head: Normocephalic.  Right Ear: External ear normal.  Left Ear: External ear normal.  Nose: Nose normal.  Mouth/Throat: Oropharynx is clear and moist.  Eyes: Conjunctivae normal are normal. Pupils are equal, round, and reactive to light. Right eye exhibits no discharge. Left eye exhibits no discharge.  Neck: Normal range of motion. Neck supple. No JVD present. No tracheal deviation present. No thyromegaly present.  Cardiovascular: Normal rate and normal heart sounds.        Irreg beats  Pulmonary/Chest: No stridor. No respiratory distress. She has no wheezes.  Abdominal: Soft. Bowel sounds are normal. She exhibits no distension and no mass. There is no tenderness. There is no rebound and no guarding.  Musculoskeletal: She exhibits no edema and no tenderness.  Lymphadenopathy:    She has no cervical adenopathy.  Neurological: She displays normal reflexes. No cranial nerve deficit. She exhibits normal muscle tone. Coordination normal.  Skin: No rash noted. No erythema.  Psychiatric: She has a normal mood and affect. Her behavior is normal. Judgment and thought content normal.   Lab Results  Component Value Date   WBC 5.9 02/19/2012   HGB 12.4 02/19/2012   HCT 37.5 02/19/2012   PLT 249.0 02/19/2012   CHOL 231* 02/19/2012   TRIG 62.0 02/19/2012   HDL 123.60 02/19/2012   LDLDIRECT 102.0 02/19/2012   ALT 16 02/19/2012   AST 21 02/19/2012   NA 134* 02/19/2012   K 3.9 02/19/2012   CL 100 02/19/2012   CREATININE 0.8 02/19/2012   BUN 12 02/19/2012   CO2 25 02/19/2012   TSH 2.68 02/19/2012          Assessment & Plan:

## 2012-07-17 NOTE — Assessment & Plan Note (Signed)
Change to Advair MDI - disk is harder to use

## 2012-07-17 NOTE — Assessment & Plan Note (Signed)
OTC meds Labs

## 2012-07-17 NOTE — Assessment & Plan Note (Signed)
Continue with current prescription therapy as reflected on the Med list.  

## 2012-07-17 NOTE — Assessment & Plan Note (Signed)
labs

## 2012-07-17 NOTE — Assessment & Plan Note (Signed)
Add prn Prilosec if needed

## 2012-07-22 ENCOUNTER — Encounter: Payer: Self-pay | Admitting: Internal Medicine

## 2012-07-22 ENCOUNTER — Ambulatory Visit (INDEPENDENT_AMBULATORY_CARE_PROVIDER_SITE_OTHER): Payer: 59 | Admitting: Internal Medicine

## 2012-07-22 VITALS — BP 124/82 | HR 80 | Temp 97.0°F | Resp 16 | Wt 125.0 lb

## 2012-07-22 DIAGNOSIS — R0789 Other chest pain: Secondary | ICD-10-CM

## 2012-07-22 DIAGNOSIS — R42 Dizziness and giddiness: Secondary | ICD-10-CM

## 2012-07-22 DIAGNOSIS — R030 Elevated blood-pressure reading, without diagnosis of hypertension: Secondary | ICD-10-CM

## 2012-07-22 HISTORY — DX: Other chest pain: R07.89

## 2012-07-22 NOTE — Progress Notes (Signed)
   Subjective:    HPI  C/o lightheadedness  7 d - off and on; shaky, tight in the chest. No syncope, dizziness. C/o ST, cough.  The patient needs to address  chronic asthma that has been well controlled with medicines; to address chronic  Hot flashes not controlled with medicines well; and to address GERD, controlled with medical treatment and diet. BP is good at home  BP Readings from Last 3 Encounters:  07/22/12 124/82  07/17/12 144/80  02/15/12 130/72   Wt Readings from Last 3 Encounters:  07/22/12 125 lb (56.7 kg)  07/17/12 125 lb (56.7 kg)  02/15/12 126 lb (57.153 kg)      Review of Systems  Constitutional: Negative for fever, chills, diaphoresis, activity change, appetite change, fatigue and unexpected weight change.  HENT: Negative for hearing loss, ear pain, congestion, sore throat, sneezing, mouth sores, neck pain, dental problem, voice change, postnasal drip and sinus pressure.   Eyes: Negative for pain and visual disturbance.  Respiratory: Negative for cough, chest tightness, wheezing and stridor.   Cardiovascular: Negative for chest pain, palpitations and leg swelling.  Gastrointestinal: Negative for nausea, vomiting, abdominal pain, blood in stool, abdominal distention and rectal pain.  Genitourinary: Negative for dysuria, hematuria, decreased urine volume, vaginal bleeding, vaginal discharge, difficulty urinating and vaginal pain.  Musculoskeletal: Negative for back pain, joint swelling and gait problem.  Skin: Negative for color change, rash and wound.  Neurological: Negative for dizziness, tremors, syncope, speech difficulty and light-headedness.  Hematological: Negative for adenopathy.  Psychiatric/Behavioral: Negative for suicidal ideas, hallucinations, behavioral problems, confusion, sleep disturbance, dysphoric mood and decreased concentration. The patient is not hyperactive.        Objective:   Physical Exam  Constitutional: She appears well-developed  and well-nourished. No distress.  HENT:  Head: Normocephalic.  Right Ear: External ear normal.  Left Ear: External ear normal.  Nose: Nose normal.  Mouth/Throat: Oropharynx is clear and moist.  Eyes: Conjunctivae normal are normal. Pupils are equal, round, and reactive to light. Right eye exhibits no discharge. Left eye exhibits no discharge.  Neck: Normal range of motion. Neck supple. No JVD present. No tracheal deviation present. No thyromegaly present.  Cardiovascular: Normal rate and normal heart sounds.        Irreg beats  Pulmonary/Chest: No stridor. No respiratory distress. She has no wheezes.  Abdominal: Soft. Bowel sounds are normal. She exhibits no distension and no mass. There is no tenderness. There is no rebound and no guarding.  Musculoskeletal: She exhibits no edema and no tenderness.  Lymphadenopathy:    She has no cervical adenopathy.  Neurological: She displays normal reflexes. No cranial nerve deficit. She exhibits normal muscle tone. Coordination normal.  Skin: No rash noted. No erythema.  Psychiatric: She has a normal mood and affect. Her behavior is normal. Judgment and thought content normal.   Lab Results  Component Value Date   WBC 5.7 07/17/2012   HGB 12.9 07/17/2012   HCT 38.4 07/17/2012   PLT 250.0 07/17/2012   CHOL 231* 02/19/2012   TRIG 62.0 02/19/2012   HDL 123.60 02/19/2012   LDLDIRECT 102.0 02/19/2012   ALT 16 02/19/2012   AST 21 02/19/2012   NA 138 07/17/2012   K 4.1 07/17/2012   CL 104 07/17/2012   CREATININE 0.8 07/17/2012   BUN 12 07/17/2012   CO2 28 07/17/2012   TSH 2.88 07/17/2012          Assessment & Plan:

## 2012-07-22 NOTE — Assessment & Plan Note (Signed)
Watching BP

## 2012-07-22 NOTE — Assessment & Plan Note (Signed)
EKG was ok  Will sch a CL To ER if issues

## 2012-07-22 NOTE — Assessment & Plan Note (Signed)
Will watch - better EKG

## 2012-07-29 ENCOUNTER — Encounter (HOSPITAL_COMMUNITY): Payer: 59

## 2012-07-29 ENCOUNTER — Encounter: Payer: Self-pay | Admitting: Cardiovascular Disease

## 2012-07-30 ENCOUNTER — Ambulatory Visit (HOSPITAL_COMMUNITY): Payer: Medicare Other | Attending: Cardiovascular Disease | Admitting: Radiology

## 2012-07-30 VITALS — BP 145/88 | HR 78 | Ht 63.0 in | Wt 124.0 lb

## 2012-07-30 DIAGNOSIS — R42 Dizziness and giddiness: Secondary | ICD-10-CM | POA: Insufficient documentation

## 2012-07-30 DIAGNOSIS — R0789 Other chest pain: Secondary | ICD-10-CM

## 2012-07-30 DIAGNOSIS — R079 Chest pain, unspecified: Secondary | ICD-10-CM

## 2012-07-30 DIAGNOSIS — R002 Palpitations: Secondary | ICD-10-CM | POA: Insufficient documentation

## 2012-07-30 MED ORDER — TECHNETIUM TC 99M SESTAMIBI GENERIC - CARDIOLITE
30.0000 | Freq: Once | INTRAVENOUS | Status: AC | PRN
  Administered 2012-07-30: 30 via INTRAVENOUS

## 2012-07-30 MED ORDER — TECHNETIUM TC 99M SESTAMIBI GENERIC - CARDIOLITE
10.0000 | Freq: Once | INTRAVENOUS | Status: AC | PRN
  Administered 2012-07-30: 10 via INTRAVENOUS

## 2012-07-30 MED ORDER — REGADENOSON 0.4 MG/5ML IV SOLN
0.4000 mg | Freq: Once | INTRAVENOUS | Status: AC
  Administered 2012-07-30: 0.4 mg via INTRAVENOUS

## 2012-07-30 NOTE — Progress Notes (Signed)
[facility] SITE 3 NUCLEAR MED [address] [place], [place] [address] [phone]    Cardiology Nuclear Med Study  Cindy Meza is a 76 y.o. female     MRN : [id number]     DOB: [birth year]  Procedure Date: 07/30/2012  Nuclear Med Background Indication for Stress Test:  Evaluation for Ischemia History:  '08 Cardiac OZH:YQMV AI, EF=50%; '08 Echo:trivial AI, mild aortic root dilatation, mild MR, EF=55%; '08 HQI:ONGEXB, EF=61% Cardiac Risk Factors: n/a  Symptoms:  Chest Pressure/Tightness, Dizziness, Light-Headedness and Palpitations   Nuclear Pre-Procedure Caffeine/Decaff Intake:  None NPO After: 7:30am   Lungs:  Clear. O2 Sat: 97% on room air. IV 0.9% NS with Angio Cath:  22g  IV Site: L Antecubital  IV Started by:  Milana Na, EMT-P  Chest Size (in):  36 Cup Size: B  Height: 5\' 3"  (1.6 m)  Weight:  124 lb (56.246 kg)  BMI:  Body mass index is 21.97 kg/(m^2). Tech Comments:  Rx this am    Nuclear Med Study 1 or 2 day study: 1 day  Stress Test Type:  Lexiscan  Reading MD: Charlton Haws, MD  Order Authorizing Provider:  Sonda Primes, MD  Resting Radionuclide: Technetium 25m Sestamibi  Resting Radionuclide Dose: 11.0 mCi   Stress Radionuclide:  Technetium 35m Sestamibi  Stress Radionuclide Dose: 33.0 mCi           Stress Protocol Rest HR: 78 Stress HR: 116  Rest BP: 145/88 Stress BP: 130/74  Exercise Time (min): n/a METS: n/a   Predicted Max HR: 146 bpm % Max HR: 79.45 bpm Rate Pressure Product: 28413    Dose of Adenosine (mg):  n/a Dose of Lexiscan: 0.4 mg  Dose of Atropine (mg): n/a Dose of Dobutamine: n/a mcg/kg/min (at max HR)  Stress Test Technologist: Milana Na, EMT-P  Nuclear Technologist:  Domenic Polite, CNMT     Rest Procedure:  Myocardial perfusion imaging was performed at rest 45 minutes following the intravenous administration of Technetium 2m Sestamibi.  Rest ECG: NSR - Normal EKG and NSR with frequent PAC;s and short  runs of SVT  Stress Procedure:  The patient received IV Lexiscan 0.4 mg over 15-seconds.  Technetium 4m Sestamibi injected at 30-seconds.  She did c/o chest tightness with Lexiscan.  Quantitative spect images were obtained after a 45 minute delay.  Stress ECG: No significant change from baseline ECG  QPS Raw Data Images:  Patient motion noted. Stress Images:  Normal homogeneous uptake in all areas of the myocardium. Rest Images:  Normal homogeneous uptake in all areas of the myocardium. Subtraction (SDS):  Normal Transient Ischemic Dilatation (Normal <1.22):  0.97 Lung/Heart Ratio (Normal <0.45):  0.30  Quantitative Gated Spect Images QGS EDV:  n/a ml QGS ESV:  n/a ml  Impression Exercise Capacity:  Lexiscan with no exercise. BP Response:  Normal blood pressure response. Clinical Symptoms:  No significant symptoms noted. ECG Impression:  Lateral T wave inversions with Lexiscan Comparison with Prior Nuclear Study: No images to compare  Overall Impression:  Normal stress nuclear study. Did have T wave inversions with lexiscan infusion  LV Ejection Fraction: Study not gated.  LV Wall Motion:  Not gated due to PACs   Charlton Haws

## 2012-07-31 ENCOUNTER — Telehealth: Payer: Self-pay | Admitting: Internal Medicine

## 2012-07-31 NOTE — Telephone Encounter (Signed)
Misty Stanley, please, inform patient that her stress test was nl - good Thx

## 2012-08-01 NOTE — Telephone Encounter (Signed)
Left detailed mess informing pt of below.  

## 2012-08-20 ENCOUNTER — Ambulatory Visit: Payer: Medicare Other | Admitting: Internal Medicine

## 2012-08-20 ENCOUNTER — Ambulatory Visit: Payer: Self-pay | Admitting: Internal Medicine

## 2012-09-17 ENCOUNTER — Ambulatory Visit (INDEPENDENT_AMBULATORY_CARE_PROVIDER_SITE_OTHER): Payer: Medicare Other | Admitting: Internal Medicine

## 2012-09-17 ENCOUNTER — Encounter: Payer: Self-pay | Admitting: Internal Medicine

## 2012-09-17 VITALS — BP 130/84 | HR 72 | Temp 96.6°F | Resp 16 | Wt 128.0 lb

## 2012-09-17 DIAGNOSIS — R030 Elevated blood-pressure reading, without diagnosis of hypertension: Secondary | ICD-10-CM

## 2012-09-17 DIAGNOSIS — R0789 Other chest pain: Secondary | ICD-10-CM

## 2012-09-17 DIAGNOSIS — K219 Gastro-esophageal reflux disease without esophagitis: Secondary | ICD-10-CM

## 2012-09-17 DIAGNOSIS — J45909 Unspecified asthma, uncomplicated: Secondary | ICD-10-CM

## 2012-09-17 DIAGNOSIS — E559 Vitamin D deficiency, unspecified: Secondary | ICD-10-CM

## 2012-09-17 MED ORDER — RANITIDINE HCL 150 MG PO TABS: 150.0000 mg | ORAL_TABLET | Freq: Two times a day (BID) | ORAL | Status: DC

## 2012-09-17 NOTE — Assessment & Plan Note (Signed)
Continue with current prescription therapy as reflected on the Med list.  

## 2012-09-17 NOTE — Progress Notes (Signed)
   Subjective:    HPI   The patient has been doing well overall without major physical or psychological issues going on lately. No CP or dizziness. The patient needs to address  chronic asthma that has been well controlled with medicines; to address chronic  Hot flashes not controlled with medicines well; and to address GERD, controlled with medical treatment and diet.   BP Readings from Last 3 Encounters:  09/17/12 130/84  07/30/12 145/88  07/22/12 124/82   Wt Readings from Last 3 Encounters:  09/17/12 128 lb (58.06 kg)  07/30/12 124 lb (56.246 kg)  07/22/12 125 lb (56.7 kg)      Review of Systems  Constitutional: Negative for fever, chills, diaphoresis, activity change, appetite change, fatigue and unexpected weight change.  HENT: Negative for hearing loss, ear pain, congestion, sore throat, sneezing, mouth sores, neck pain, dental problem, voice change, postnasal drip and sinus pressure.   Eyes: Negative for pain and visual disturbance.  Respiratory: Negative for cough, chest tightness, wheezing and stridor.   Cardiovascular: Negative for chest pain, palpitations and leg swelling.  Gastrointestinal: Negative for nausea, vomiting, abdominal pain, blood in stool, abdominal distention and rectal pain.  Genitourinary: Negative for dysuria, hematuria, decreased urine volume, vaginal bleeding, vaginal discharge, difficulty urinating and vaginal pain.  Musculoskeletal: Negative for back pain, joint swelling and gait problem.  Skin: Negative for color change, rash and wound.  Neurological: Negative for dizziness, tremors, syncope, speech difficulty and light-headedness.  Hematological: Negative for adenopathy.  Psychiatric/Behavioral: Negative for suicidal ideas, hallucinations, behavioral problems, confusion, sleep disturbance, dysphoric mood and decreased concentration. The patient is not hyperactive.        Objective:   Physical Exam  Constitutional: She appears well-developed  and well-nourished. No distress.  HENT:  Head: Normocephalic.  Right Ear: External ear normal.  Left Ear: External ear normal.  Nose: Nose normal.  Mouth/Throat: Oropharynx is clear and moist.  Eyes: Conjunctivae are normal. Pupils are equal, round, and reactive to light. Right eye exhibits no discharge. Left eye exhibits no discharge.  Neck: Normal range of motion. Neck supple. No JVD present. No tracheal deviation present. No thyromegaly present.  Cardiovascular: Normal rate and normal heart sounds.   Irreg beats  Pulmonary/Chest: No stridor. No respiratory distress. She has no wheezes.  Abdominal: Soft. Bowel sounds are normal. She exhibits no distension and no mass. There is no tenderness. There is no rebound and no guarding.  Musculoskeletal: She exhibits no edema and no tenderness.  Lymphadenopathy:    She has no cervical adenopathy.  Neurological: She displays normal reflexes. No cranial nerve deficit. She exhibits normal muscle tone. Coordination normal.  Skin: No rash noted. No erythema.  Psychiatric: She has a normal mood and affect. Her behavior is normal. Judgment and thought content normal.   Lab Results  Component Value Date   WBC 5.7 07/17/2012   HGB 12.9 07/17/2012   HCT 38.4 07/17/2012   PLT 250.0 07/17/2012   CHOL 231* 02/19/2012   TRIG 62.0 02/19/2012   HDL 123.60 02/19/2012   LDLDIRECT 102.0 02/19/2012   ALT 16 02/19/2012   AST 21 02/19/2012   NA 138 07/17/2012   K 4.1 07/17/2012   CL 104 07/17/2012   CREATININE 0.8 07/17/2012   BUN 12 07/17/2012   CO2 28 07/17/2012   TSH 2.88 07/17/2012          Assessment & Plan:

## 2012-09-18 NOTE — Assessment & Plan Note (Signed)
BP Readings from Last 3 Encounters:  09/17/12 130/84  07/30/12 145/88  07/22/12 124/82

## 2012-09-18 NOTE — Assessment & Plan Note (Signed)
Continue with current prescription therapy as reflected on the Med list.  

## 2012-09-18 NOTE — Assessment & Plan Note (Signed)
No relapse Cl was nl

## 2012-09-18 NOTE — Assessment & Plan Note (Signed)
Continue with current  therapy as reflected on the Med list.  

## 2013-02-17 ENCOUNTER — Encounter: Payer: Medicare Other | Admitting: Internal Medicine

## 2013-03-06 ENCOUNTER — Other Ambulatory Visit: Payer: Self-pay | Admitting: Internal Medicine

## 2013-03-17 ENCOUNTER — Other Ambulatory Visit (INDEPENDENT_AMBULATORY_CARE_PROVIDER_SITE_OTHER): Payer: Medicare Other

## 2013-03-17 ENCOUNTER — Encounter: Payer: Self-pay | Admitting: Internal Medicine

## 2013-03-17 ENCOUNTER — Ambulatory Visit (INDEPENDENT_AMBULATORY_CARE_PROVIDER_SITE_OTHER): Payer: Medicare Other | Admitting: Internal Medicine

## 2013-03-17 VITALS — BP 150/90 | HR 72 | Ht 63.0 in | Wt 121.0 lb

## 2013-03-17 DIAGNOSIS — R634 Abnormal weight loss: Secondary | ICD-10-CM

## 2013-03-17 DIAGNOSIS — Z0000 Encounter for general adult medical examination without abnormal findings: Secondary | ICD-10-CM

## 2013-03-17 DIAGNOSIS — D649 Anemia, unspecified: Secondary | ICD-10-CM

## 2013-03-17 DIAGNOSIS — Z23 Encounter for immunization: Secondary | ICD-10-CM

## 2013-03-17 DIAGNOSIS — J45909 Unspecified asthma, uncomplicated: Secondary | ICD-10-CM

## 2013-03-17 DIAGNOSIS — R7309 Other abnormal glucose: Secondary | ICD-10-CM

## 2013-03-17 DIAGNOSIS — R739 Hyperglycemia, unspecified: Secondary | ICD-10-CM

## 2013-03-17 DIAGNOSIS — Z136 Encounter for screening for cardiovascular disorders: Secondary | ICD-10-CM

## 2013-03-17 LAB — HEPATIC FUNCTION PANEL
ALT: 16 U/L (ref 0–35)
AST: 21 U/L (ref 0–37)
Albumin: 4.1 g/dL (ref 3.5–5.2)
Total Protein: 7.8 g/dL (ref 6.0–8.3)

## 2013-03-17 LAB — URINALYSIS
Bilirubin Urine: NEGATIVE
Ketones, ur: NEGATIVE
Leukocytes, UA: NEGATIVE
Urobilinogen, UA: 0.2 (ref 0.0–1.0)

## 2013-03-17 LAB — CBC WITH DIFFERENTIAL/PLATELET
Basophils Absolute: 0 10*3/uL (ref 0.0–0.1)
Eosinophils Absolute: 0.3 10*3/uL (ref 0.0–0.7)
Eosinophils Relative: 5.3 % — ABNORMAL HIGH (ref 0.0–5.0)
MCHC: 33.8 g/dL (ref 30.0–36.0)
MCV: 94.1 fl (ref 78.0–100.0)
Monocytes Absolute: 0.4 10*3/uL (ref 0.1–1.0)
Neutrophils Relative %: 47 % (ref 43.0–77.0)
Platelets: 286 10*3/uL (ref 150.0–400.0)
WBC: 6.6 10*3/uL (ref 4.5–10.5)

## 2013-03-17 LAB — HEMOGLOBIN A1C: Hgb A1c MFr Bld: 5.1 % (ref 4.6–6.5)

## 2013-03-17 LAB — BASIC METABOLIC PANEL
BUN: 14 mg/dL (ref 6–23)
Chloride: 104 mEq/L (ref 96–112)
Creatinine, Ser: 0.8 mg/dL (ref 0.4–1.2)

## 2013-03-17 LAB — LDL CHOLESTEROL, DIRECT: Direct LDL: 104 mg/dL

## 2013-03-17 LAB — TSH: TSH: 3.11 u[IU]/mL (ref 0.35–5.50)

## 2013-03-17 LAB — LIPID PANEL
Cholesterol: 243 mg/dL — ABNORMAL HIGH (ref 0–200)
Triglycerides: 52 mg/dL (ref 0.0–149.0)

## 2013-03-17 NOTE — Progress Notes (Signed)
Patient ID: Cindy Meza, female   DOB: Jan 26, 1936, 77 y.o.   MRN: [id number]  Subjective:     HPI  The patient is here for a wellness exam. The patient has been doing well overall without major physical or psychological issues going on lately. The patient needs to address  chronic asthma that has been well controlled with medicines; to address chronic  Hot flashes not controlled with medicines well; and to address GERD, osteoporosis, controlled with medical treatment and diet.   BP Readings from Last 3 Encounters:  03/17/13 150/90  09/17/12 130/84  07/30/12 145/88   Wt Readings from Last 3 Encounters:  03/17/13 121 lb (54.885 kg)  09/17/12 128 lb (58.06 kg)  07/30/12 124 lb (56.246 kg)      Review of Systems  Constitutional: Negative for fever, chills, diaphoresis, activity change, appetite change, fatigue and unexpected weight change.  HENT: Negative for hearing loss, ear pain, congestion, sore throat, sneezing, mouth sores, neck pain, dental problem, voice change, postnasal drip and sinus pressure.   Eyes: Negative for pain and visual disturbance.  Respiratory: Negative for cough, chest tightness, wheezing and stridor.   Cardiovascular: Negative for chest pain, palpitations and leg swelling.  Gastrointestinal: Negative for nausea, vomiting, abdominal pain, blood in stool, abdominal distention and rectal pain.  Genitourinary: Negative for dysuria, hematuria, decreased urine volume, vaginal bleeding, vaginal discharge, difficulty urinating and vaginal pain.  Musculoskeletal: Negative for back pain, joint swelling and gait problem.  Skin: Negative for color change, rash and wound.  Neurological: Negative for dizziness, tremors, syncope, speech difficulty and light-headedness.  Hematological: Negative for adenopathy.  Psychiatric/Behavioral: Negative for suicidal ideas, hallucinations, behavioral problems, confusion, sleep disturbance, dysphoric mood and decreased concentration.  The patient is not hyperactive.        Objective:   Physical Exam  Constitutional: She appears well-developed and well-nourished. No distress.  HENT:  Head: Normocephalic.  Right Ear: External ear normal.  Left Ear: External ear normal.  Nose: Nose normal.  Mouth/Throat: Oropharynx is clear and moist.  Eyes: Conjunctivae are normal. Pupils are equal, round, and reactive to light. Right eye exhibits no discharge. Left eye exhibits no discharge.  Neck: Normal range of motion. Neck supple. No JVD present. No tracheal deviation present. No thyromegaly present.  Cardiovascular: Normal rate and normal heart sounds.   Irreg beats  Pulmonary/Chest: No stridor. No respiratory distress. She has no wheezes.  Abdominal: Soft. Bowel sounds are normal. She exhibits no distension and no mass. There is no tenderness. There is no rebound and no guarding.  Musculoskeletal: She exhibits no edema and no tenderness.  Lymphadenopathy:    She has no cervical adenopathy.  Neurological: She displays normal reflexes. No cranial nerve deficit. She exhibits normal muscle tone. Coordination normal.  Skin: No rash noted. No erythema.  Psychiatric: She has a normal mood and affect. Her behavior is normal. Judgment and thought content normal.   Lab Results  Component Value Date   WBC 5.7 07/17/2012   HGB 12.9 07/17/2012   HCT 38.4 07/17/2012   PLT 250.0 07/17/2012   CHOL 231* 02/19/2012   TRIG 62.0 02/19/2012   HDL 123.60 02/19/2012   LDLDIRECT 102.0 02/19/2012   ALT 16 02/19/2012   AST 21 02/19/2012   NA 138 07/17/2012   K 4.1 07/17/2012   CL 104 07/17/2012   CREATININE 0.8 07/17/2012   BUN 12 07/17/2012   CO2 28 07/17/2012   TSH 2.88 07/17/2012  Assessment & Plan:

## 2013-03-17 NOTE — Assessment & Plan Note (Signed)
Continue with current prescription therapy as reflected on the Med list.  Gluten free trial (no wheat products) for 4-6 weeks. OK to use gluten-free bread and gluten-free pasta.  Milk free trial (no milk, ice cream, cheese and yogurt) for 4-6 weeks. OK to use almond, coconut, rice or soy milk. "Almond breeze" brand tastes good.  

## 2013-03-17 NOTE — Patient Instructions (Signed)
Gluten free trial (no wheat products) for 4-6 weeks. OK to use gluten-free bread and gluten-free pasta.  Milk free trial (no milk, ice cream, cheese and yogurt) for 4-6 weeks. OK to use almond, coconut, rice or soy milk. "Almond breeze" brand tastes good.  [employer]

## 2013-03-17 NOTE — Assessment & Plan Note (Signed)
The patient is here for annual Medicare wellness examination and management of other chronic and acute problems.   The risk factors are reflected in the social history.  The roster of all physicians providing medical care to patient - is listed in the Snapshot section of the chart.  Activities of daily living:  The patient is 100% inedpendent in all ADLs: dressing, toileting, feeding as well as independent mobility  Home safety : The patient has smoke detectors in the home. They wear seatbelts.No firearms at home ( firearms are present in the home, kept in a safe fashion). There is no violence in the home.   There is no risks for hepatitis, STDs or HIV. There is no   history of blood transfusion. They have no travel history to infectious disease endemic areas of the world.  The patient has (has not) seen their dentist in the last six month. They have (not) seen their eye doctor in the last year. They deny (admit to) any hearing difficulty and have not had audiologic testing in the last year.  They do not  have excessive sun exposure. Discussed the need for sun protection: hats, long sleeves and use of sunscreen if there is significant sun exposure.   Diet: the importance of a healthy diet is discussed. They do have a healthy (unhealthy-high fat/fast food) diet.  The patient has no regular exercise program. The benefits of regular aerobic exercise were discussed.  Depression screen: there are no signs or vegative symptoms of depression- irritability, change in appetite, anhedonia, sadness/tearfullness.  Cognitive assessment: the patient manages all their financial and personal affairs and is actively engaged. They could relate day,date,year and events; recalled 3/3 objects at 3 minutes; performed clock-face test normally.  The following portions of the patient's history were reviewed and updated as appropriate: allergies, current medications, past family history, past medical history,  past  surgical history, past social history  and problem list.  Vision, hearing, body mass index were assessed and reviewed.   During the course of the visit the patient was educated and counseled about appropriate screening and preventive services including : fall prevention , diabetes screening, nutrition counseling, colorectal cancer screening, and recommended immunizations.

## 2013-07-31 ENCOUNTER — Encounter: Payer: Self-pay | Admitting: Internal Medicine

## 2013-08-24 ENCOUNTER — Encounter: Payer: Self-pay | Admitting: Internal Medicine

## 2013-09-17 ENCOUNTER — Ambulatory Visit (INDEPENDENT_AMBULATORY_CARE_PROVIDER_SITE_OTHER): Payer: Medicare Other | Admitting: Internal Medicine

## 2013-09-17 ENCOUNTER — Encounter: Payer: Self-pay | Admitting: Internal Medicine

## 2013-09-17 VITALS — BP 142/88 | HR 76 | Temp 97.3°F | Resp 16 | Wt 120.0 lb

## 2013-09-17 DIAGNOSIS — Z23 Encounter for immunization: Secondary | ICD-10-CM

## 2013-09-17 DIAGNOSIS — R030 Elevated blood-pressure reading, without diagnosis of hypertension: Secondary | ICD-10-CM

## 2013-09-17 DIAGNOSIS — IMO0001 Reserved for inherently not codable concepts without codable children: Secondary | ICD-10-CM

## 2013-09-17 DIAGNOSIS — K219 Gastro-esophageal reflux disease without esophagitis: Secondary | ICD-10-CM

## 2013-09-17 DIAGNOSIS — J45909 Unspecified asthma, uncomplicated: Secondary | ICD-10-CM

## 2013-09-17 MED ORDER — RANITIDINE HCL 150 MG PO TABS: ORAL_TABLET | ORAL | Status: DC

## 2013-09-17 MED ORDER — ALBUTEROL SULFATE HFA 108 (90 BASE) MCG/ACT IN AERS: 1.0000 | INHALATION_SPRAY | Freq: Four times a day (QID) | RESPIRATORY_TRACT | Status: DC | PRN

## 2013-09-17 MED ORDER — FLUTICASONE-SALMETEROL 115-21 MCG/ACT IN AERO: 2.0000 | INHALATION_SPRAY | Freq: Two times a day (BID) | RESPIRATORY_TRACT | Status: DC

## 2013-09-17 NOTE — Assessment & Plan Note (Signed)
Continue with current prescription therapy as reflected on the Med list.  

## 2013-09-17 NOTE — Progress Notes (Signed)
Pre visit review using our clinic review tool, if applicable. No additional management support is needed unless otherwise documented below in the visit note. 

## 2013-09-19 ENCOUNTER — Encounter: Payer: Self-pay | Admitting: Internal Medicine

## 2013-09-19 NOTE — Progress Notes (Signed)
   Subjective:     HPI   The patient needs to address  chronic asthma that has been well controlled with medicines; to address chronic  Hot flashes not controlled with medicines well; and to address GERD, osteoporosis, controlled with medical treatment and diet.   BP Readings from Last 3 Encounters:  09/17/13 142/88  03/17/13 150/90  09/17/12 130/84   Wt Readings from Last 3 Encounters:  09/17/13 120 lb (54.432 kg)  03/17/13 121 lb (54.885 kg)  09/17/12 128 lb (58.06 kg)      Review of Systems  Constitutional: Negative for fever, chills, diaphoresis, activity change, appetite change, fatigue and unexpected weight change.  HENT: Negative for congestion, dental problem, ear pain, hearing loss, mouth sores, postnasal drip, sinus pressure, sneezing, sore throat and voice change.   Eyes: Negative for pain and visual disturbance.  Respiratory: Negative for cough, chest tightness, wheezing and stridor.   Cardiovascular: Negative for chest pain, palpitations and leg swelling.  Gastrointestinal: Negative for nausea, vomiting, abdominal pain, blood in stool, abdominal distention and rectal pain.  Genitourinary: Negative for dysuria, hematuria, decreased urine volume, vaginal bleeding, vaginal discharge, difficulty urinating and vaginal pain.  Musculoskeletal: Negative for back pain, gait problem, joint swelling and neck pain.  Skin: Negative for color change, rash and wound.  Neurological: Negative for dizziness, tremors, syncope, speech difficulty and light-headedness.  Hematological: Negative for adenopathy.  Psychiatric/Behavioral: Negative for suicidal ideas, hallucinations, behavioral problems, confusion, sleep disturbance, dysphoric mood and decreased concentration. The patient is not hyperactive.        Objective:   Physical Exam  Constitutional: She appears well-developed and well-nourished. No distress.  HENT:  Head: Normocephalic.  Right Ear: External ear normal.  Left  Ear: External ear normal.  Nose: Nose normal.  Mouth/Throat: Oropharynx is clear and moist.  Eyes: Conjunctivae are normal. Pupils are equal, round, and reactive to light. Right eye exhibits no discharge. Left eye exhibits no discharge.  Neck: Normal range of motion. Neck supple. No JVD present. No tracheal deviation present. No thyromegaly present.  Cardiovascular: Normal rate and normal heart sounds.   Irreg beats  Pulmonary/Chest: No stridor. No respiratory distress. She has no wheezes.  Abdominal: Soft. Bowel sounds are normal. She exhibits no distension and no mass. There is no tenderness. There is no rebound and no guarding.  Musculoskeletal: She exhibits no edema and no tenderness.  Lymphadenopathy:    She has no cervical adenopathy.  Neurological: She displays normal reflexes. No cranial nerve deficit. She exhibits normal muscle tone. Coordination normal.  Skin: No rash noted. No erythema.  Psychiatric: She has a normal mood and affect. Her behavior is normal. Judgment and thought content normal.   Lab Results  Component Value Date   WBC 6.6 03/17/2013   HGB 13.4 03/17/2013   HCT 39.7 03/17/2013   PLT 286.0 03/17/2013   CHOL 243* 03/17/2013   TRIG 52.0 03/17/2013   HDL 136.70 03/17/2013   LDLDIRECT 104.0 03/17/2013   ALT 16 03/17/2013   AST 21 03/17/2013   NA 139 03/17/2013   K 4.2 03/17/2013   CL 104 03/17/2013   CREATININE 0.8 03/17/2013   BUN 14 03/17/2013   CO2 28 03/17/2013   TSH 3.11 03/17/2013   HGBA1C 5.1 03/17/2013          Assessment & Plan:

## 2013-09-19 NOTE — Assessment & Plan Note (Signed)
Continue with current prescription therapy as reflected on the Med list.  

## 2013-09-19 NOTE — Assessment & Plan Note (Signed)
Nl BP at home 

## 2013-10-05 ENCOUNTER — Other Ambulatory Visit: Payer: Self-pay | Admitting: Internal Medicine

## 2013-10-06 ENCOUNTER — Ambulatory Visit (AMBULATORY_SURGERY_CENTER): Payer: Self-pay

## 2013-10-06 VITALS — Ht 63.0 in | Wt 121.0 lb

## 2013-10-06 DIAGNOSIS — Z860100 Personal history of colon polyps, unspecified: Secondary | ICD-10-CM

## 2013-10-06 MED ORDER — SUPREP BOWEL PREP KIT 17.5-3.13-1.6 GM/177ML PO SOLN: 1.0000 | Freq: Once | ORAL | Status: DC

## 2013-10-12 ENCOUNTER — Encounter: Payer: Self-pay | Admitting: Internal Medicine

## 2013-10-14 ENCOUNTER — Telehealth: Payer: Self-pay | Admitting: Internal Medicine

## 2013-10-14 NOTE — Telephone Encounter (Signed)
Reviewed diet restrictions with pt

## 2013-10-20 ENCOUNTER — Telehealth: Payer: Self-pay | Admitting: Internal Medicine

## 2013-10-20 NOTE — Telephone Encounter (Signed)
Pt. States she had one episode of bleeding hemorrhoids today.  She is scheduled for colonoscopy in am.  She has had hemorrhoids bleed in the past.  Encouraged To use baby wipes to pat her anal area dry after BMs today and while doing prep.  Also advised to use vasoline to keep anal area from becoming dry and irritated. Pt. Verbalized understanding.

## 2013-10-21 ENCOUNTER — Ambulatory Visit (AMBULATORY_SURGERY_CENTER): Payer: Medicare Other | Admitting: Internal Medicine

## 2013-10-21 ENCOUNTER — Encounter: Payer: Self-pay | Admitting: Internal Medicine

## 2013-10-21 VITALS — BP 165/72 | HR 66 | Temp 97.3°F | Resp 31 | Ht 63.0 in | Wt 121.0 lb

## 2013-10-21 DIAGNOSIS — D126 Benign neoplasm of colon, unspecified: Secondary | ICD-10-CM

## 2013-10-21 DIAGNOSIS — K6289 Other specified diseases of anus and rectum: Secondary | ICD-10-CM

## 2013-10-21 DIAGNOSIS — R198 Other specified symptoms and signs involving the digestive system and abdomen: Secondary | ICD-10-CM

## 2013-10-21 DIAGNOSIS — Z86010 Personal history of colonic polyps: Secondary | ICD-10-CM

## 2013-10-21 DIAGNOSIS — R933 Abnormal findings on diagnostic imaging of other parts of digestive tract: Secondary | ICD-10-CM

## 2013-10-21 MED ORDER — SODIUM CHLORIDE 0.9 % IV SOLN: 500.0000 mL | INTRAVENOUS | Status: DC

## 2013-10-21 NOTE — Op Note (Addendum)
[facility]  [address] [place], [address]   COLONOSCOPY PROCEDURE REPORT  PATIENT: Cindy, Meza  MR#: [id number] BIRTHDATE: 1936/02/28 , 85  yrs. old GENDER: Female ENDOSCOPIST: Gatha Mayer, MD, [facility] PROCEDURE DATE:  10/21/2013 PROCEDURE:   Colonoscopy with biopsy and snare polypectomy First Screening Colonoscopy - Avg.  risk and is 50 yrs.  old or older - No.  Prior Negative Screening - Now for repeat screening. N/A  History of Adenoma - Now for follow-up colonoscopy & has been > or = to 3 yrs.  Yes hx of adenoma.  Has been 3 or more years since last colonoscopy.  Polyps Removed Today? Yes. ASA CLASS:   Class II INDICATIONS:Patient's personal history of adenomatous colon polyps.  MEDICATIONS: propofol (Diprivan) 250mg  IV, MAC sedation, administered by CRNA, and These medications were titrated to patient response per physician's verbal order  DESCRIPTION OF PROCEDURE:   After the risks benefits and alternatives of the procedure were thoroughly explained, informed consent was obtained.  A digital rectal exam revealed no abnormalities of the rectum.   The LB [id number]-H190 [id number]  endoscope was introduced through the anus and advanced to the cecum, which was identified by both the appendix and ileocecal valve. No adverse events experienced.   The quality of the prep was Suprep good  The instrument was then slowly withdrawn as the colon was fully examined.  COLON FINDINGS: Four diminutive sessile polyps were found at the cecum, in the transverse colon, and at the splenic flexure.  A polypectomy was performed with a cold snare.  The resection was complete and the polyp tissue was completely retrieved.   A small-medium  patch of abnormal mucosa was found in the rectum. The mucosa was friable and elevated, ? polyp or inflammation. Multiple biopsies were performed using cold forceps.   The colon mucosa was otherwise normal.   A right colon retroflexion  was performed.  Retroflexed views revealed no abnormalities. The time to cecum=1 minutes 56 seconds.  Withdrawal time=13 minutes 16 seconds.  The scope was withdrawn and the procedure completed. COMPLICATIONS: There were no complications.    ENDOSCOPIC IMPRESSION: 1.   Four diminutive sessile polyps were found at the cecum, in the transverse colon, and at the splenic flexure; polypectomy was performed with a cold snare 2.   A small-medium patch of abnormal mucosa was found in the rectum; multiple biopsies were performed using cold forceps - ? inflamed hemorrhoid tissue vs. polyp 3.   The colon mucosa was otherwise normal - hx 2 small adenomas 2009  RECOMMENDATIONS: Await pathology results   eSigned:  Gatha Mayer, MD, [facility] 10/21/2013 12:27 PM Revised: 10/21/2013 12:27 PM  cc: The Patient   PATIENT NAME:  Cindy, Meza MR#: [id number]

## 2013-10-21 NOTE — Progress Notes (Signed)
A/ox3 pleased with MAC, report to Karol RN 

## 2013-10-21 NOTE — Progress Notes (Signed)
Called to room to assist during endoscopic procedure.  Patient ID and intended procedure confirmed with present staff. Received instructions for my participation in the procedure from the performing physician.  

## 2013-10-21 NOTE — Progress Notes (Signed)
1250 Assisted pt to dress.  Gait steady. Assisted to bathroom and then placed in wheelchair for discharge.

## 2013-10-21 NOTE — Patient Instructions (Addendum)
I found and removed 4 small polyps that look benign. There was a possible polyp in the rectum that I could not remove but took biopsies. It might be inflammation or a polyp. I will let you know.  I appreciate the opportunity to care for you. Gatha Mayer, MD, FACG  YOU HAD AN ENDOSCOPIC PROCEDURE TODAY AT [facility]: Refer to the procedure report that was given to you for any specific questions about what was found during the examination.  If the procedure report does not answer your questions, please call your gastroenterologist to clarify.  If you requested that your care partner not be given the details of your procedure findings, then the procedure report has been included in a sealed envelope for you to review at your convenience later.  YOU SHOULD EXPECT: Some feelings of bloating in the abdomen. Passage of more gas than usual.  Walking can help get rid of the air that was put into your GI tract during the procedure and reduce the bloating. If you had a lower endoscopy (such as a colonoscopy or flexible sigmoidoscopy) you may notice spotting of blood in your stool or on the toilet paper. If you underwent a bowel prep for your procedure, then you may not have a normal bowel movement for a few days.  DIET: Your first meal following the procedure should be a light meal and then it is ok to progress to your normal diet.  A half-sandwich or bowl of soup is an example of a good first meal.  Heavy or fried foods are harder to digest and may make you feel nauseous or bloated.  Likewise meals heavy in dairy and vegetables can cause extra gas to form and this can also increase the bloating.  Drink plenty of fluids but you should avoid alcoholic beverages for 24 hours.  ACTIVITY: Your care partner should take you home directly after the procedure.  You should plan to take it easy, moving slowly for the rest of the day.  You can resume normal activity the day after the procedure however  you should NOT DRIVE or use heavy machinery for 24 hours (because of the sedation medicines used during the test).    SYMPTOMS TO REPORT IMMEDIATELY: A gastroenterologist can be reached at any hour.  During normal business hours, 8:30 AM to 5:00 PM Monday through Friday, call [phone].  After hours and on weekends, please call the GI answering service at [phone] who will take a message and have the physician on call contact you.   Following lower endoscopy (colonoscopy or flexible sigmoidoscopy):  Excessive amounts of blood in the stool  Significant tenderness or worsening of abdominal pains  Swelling of the abdomen that is new, acute  Fever of 100F or higher  Following upper endoscopy (EGD)  Vomiting of blood or coffee ground material  New chest pain or pain under the shoulder blades  Painful or persistently difficult swallowing  New shortness of breath  Fever of 100F or higher  Black, tarry-looking stools  FOLLOW UP: If any biopsies were taken you will be contacted by phone or by letter within the next 1-3 weeks.  Call your gastroenterologist if you have not heard about the biopsies in 3 weeks.  Our staff will call the home number listed on your records the next business day following your procedure to check on you and address any questions or concerns that you may have at that time regarding the information given to  you following your procedure. This is a courtesy call and so if there is no answer at the home number and we have not heard from you through the emergency physician on call, we will assume that you have returned to your regular daily activities without incident.  SIGNATURES/CONFIDENTIALITY: You and/or your care partner have signed paperwork which will be entered into your electronic medical record.  These signatures attest to the fact that that the information above on your After Visit Summary has been reviewed and is understood.  Full responsibility of the  confidentiality of this discharge information lies with you and/or your care-partner.  Please follow all discharge instructions given to you by the recovery room nurse. If you have any questions or problems after discharge please call one of the numbers listed above. You will receive a phone call in the am to see how you are doing and answer any questions you may have. Thank you for choosing [facility] for your health care needs.

## 2013-10-22 ENCOUNTER — Telehealth: Payer: Self-pay | Admitting: *Deleted

## 2013-10-22 NOTE — Telephone Encounter (Signed)
  Follow up Call-  Call back number 10/21/2013  Post procedure Call Back phone  # [phone]  Permission to leave phone message Yes     Patient questions:  Do you have a fever, pain , or abdominal swelling? no Pain Score  0 *  Have you tolerated food without any problems? yes  Have you been able to return to your normal activities? yes  Do you have any questions about your discharge instructions: Diet   no Medications  no Follow up visit  no  Do you have questions or concerns about your Care? no  Actions: * If pain score is 4 or above: No action needed, pain <4.

## 2013-10-27 ENCOUNTER — Encounter: Payer: Self-pay | Admitting: Internal Medicine

## 2013-10-27 NOTE — Progress Notes (Signed)
Quick Note:  3 diminutive adenomas - consider repeat colonoscopy in 2018 Inflammatory rectal changes also - suspect related to hemorrhoids ______

## 2014-03-23 ENCOUNTER — Other Ambulatory Visit (INDEPENDENT_AMBULATORY_CARE_PROVIDER_SITE_OTHER): Payer: Medicare Other

## 2014-03-23 ENCOUNTER — Encounter: Payer: Self-pay | Admitting: Internal Medicine

## 2014-03-23 ENCOUNTER — Ambulatory Visit (INDEPENDENT_AMBULATORY_CARE_PROVIDER_SITE_OTHER): Payer: Medicare Other | Admitting: Internal Medicine

## 2014-03-23 VITALS — BP 130/82 | HR 80 | Temp 97.5°F | Resp 16 | Wt 121.0 lb

## 2014-03-23 DIAGNOSIS — IMO0001 Reserved for inherently not codable concepts without codable children: Secondary | ICD-10-CM

## 2014-03-23 DIAGNOSIS — Z0000 Encounter for general adult medical examination without abnormal findings: Secondary | ICD-10-CM

## 2014-03-23 DIAGNOSIS — E785 Hyperlipidemia, unspecified: Secondary | ICD-10-CM

## 2014-03-23 DIAGNOSIS — R030 Elevated blood-pressure reading, without diagnosis of hypertension: Secondary | ICD-10-CM

## 2014-03-23 DIAGNOSIS — J45909 Unspecified asthma, uncomplicated: Secondary | ICD-10-CM

## 2014-03-23 DIAGNOSIS — Z23 Encounter for immunization: Secondary | ICD-10-CM

## 2014-03-23 LAB — URINALYSIS
Bilirubin Urine: NEGATIVE
Hgb urine dipstick: NEGATIVE
KETONES UR: NEGATIVE
Leukocytes, UA: NEGATIVE
NITRITE: NEGATIVE
PH: 7 (ref 5.0–8.0)
Specific Gravity, Urine: <=1.005 — AB (ref 1.000–1.030)
Total Protein, Urine: NEGATIVE
Urine Glucose: NEGATIVE
Urobilinogen, UA: 0.2 (ref 0.0–1.0)

## 2014-03-23 LAB — LIPID PANEL
CHOL/HDL RATIO: 2
Cholesterol: 256 mg/dL — ABNORMAL HIGH (ref 0–200)
HDL: 129.6 mg/dL (ref >39.00)
LDL Cholesterol: 116 mg/dL — ABNORMAL HIGH (ref 0–99)
NonHDL: 126.4
TRIGLYCERIDES: 54 mg/dL (ref 0.0–149.0)
VLDL: 10.8 mg/dL (ref 0.0–40.0)

## 2014-03-23 LAB — HEPATIC FUNCTION PANEL
ALK PHOS: 40 U/L (ref 39–117)
ALT: 18 U/L (ref 0–35)
AST: 22 U/L (ref 0–37)
Albumin: 4.1 g/dL (ref 3.5–5.2)
BILIRUBIN TOTAL: 0.5 mg/dL (ref 0.2–1.2)
Bilirubin, Direct: 0.1 mg/dL (ref 0.0–0.3)
Total Protein: 7.8 g/dL (ref 6.0–8.3)

## 2014-03-23 LAB — BASIC METABOLIC PANEL
BUN: 16 mg/dL (ref 6–23)
CALCIUM: 9.3 mg/dL (ref 8.4–10.5)
CO2: 26 mEq/L (ref 19–32)
Chloride: 102 mEq/L (ref 96–112)
Creatinine, Ser: 0.8 mg/dL (ref 0.4–1.2)
GFR: 93.69 mL/min (ref >60.00)
GLUCOSE: 89 mg/dL (ref 70–99)
POTASSIUM: 3.9 meq/L (ref 3.5–5.1)
SODIUM: 139 meq/L (ref 135–145)

## 2014-03-23 LAB — TSH: TSH: 2.53 u[IU]/mL (ref 0.35–4.50)

## 2014-03-23 MED ORDER — RANITIDINE HCL 150 MG PO TABS: ORAL_TABLET | ORAL | Status: DC

## 2014-03-23 MED ORDER — FLUTICASONE-SALMETEROL 115-21 MCG/ACT IN AERO: 2.0000 | INHALATION_SPRAY | Freq: Two times a day (BID) | RESPIRATORY_TRACT | Status: DC

## 2014-03-23 MED ORDER — ALBUTEROL SULFATE HFA 108 (90 BASE) MCG/ACT IN AERS: 1.0000 | INHALATION_SPRAY | Freq: Four times a day (QID) | RESPIRATORY_TRACT | Status: DC | PRN

## 2014-03-23 NOTE — Patient Instructions (Signed)
Preventive Care for Adults A healthy lifestyle and preventive care can promote health and wellness. Preventive health guidelines for women include the following key practices.  A routine yearly physical is a good way to check with your health care provider about your health and preventive screening. It is a chance to share any concerns and updates on your health and to receive a thorough exam.  Visit your dentist for a routine exam and preventive care every 6 months. Brush your teeth twice a day and floss once a day. Good oral hygiene prevents tooth decay and gum disease.  The frequency of eye exams is based on your age, health, family medical history, use of contact lenses, and other factors. Follow your health care provider's recommendations for frequency of eye exams.  Eat a healthy diet. Foods like vegetables, fruits, whole grains, low-fat dairy products, and lean protein foods contain the nutrients you need without too many calories. Decrease your intake of foods high in solid fats, added sugars, and salt. Eat the right amount of calories for you.Get information about a proper diet from your health care provider, if necessary.  Regular physical exercise is one of the most important things you can do for your health. Most adults should get at least 150 minutes of moderate-intensity exercise (any activity that increases your heart rate and causes you to sweat) each week. In addition, most adults need muscle-strengthening exercises on 2 or more days a week.  Maintain a healthy weight. The body mass index (BMI) is a screening tool to identify possible weight problems. It provides an estimate of body fat based on height and weight. Your health care provider can find your BMI and can help you achieve or maintain a healthy weight.For adults 20 years and older:  A BMI below 18.5 is considered underweight.  A BMI of 18.5 to 24.9 is normal.  A BMI of 25 to 29.9 is considered overweight.  A BMI of  30 and above is considered obese.  Maintain normal blood lipids and cholesterol levels by exercising and minimizing your intake of saturated fat. Eat a balanced diet with plenty of fruit and vegetables. Blood tests for lipids and cholesterol should begin at age 76 and be repeated every 5 years. If your lipid or cholesterol levels are high, you are over 50, or you are at high risk for heart disease, you may need your cholesterol levels checked more frequently.Ongoing high lipid and cholesterol levels should be treated with medicines if diet and exercise are not working.  If you smoke, find out from your health care provider how to quit. If you do not use tobacco, do not start.  Lung cancer screening is recommended for adults aged 22-80 years who are at high risk for developing lung cancer because of a history of smoking. A yearly low-dose CT scan of the lungs is recommended for people who have at least a 30-pack-year history of smoking and are a current smoker or have quit within the past 15 years. A pack year of smoking is smoking an average of 1 pack of cigarettes a day for 1 year (for example: 1 pack a day for 30 years or 2 packs a day for 15 years). Yearly screening should continue until the smoker has stopped smoking for at least 15 years. Yearly screening should be stopped for people who develop a health problem that would prevent them from having lung cancer treatment.  If you are pregnant, do not drink alcohol. If you are breastfeeding,  be very cautious about drinking alcohol. If you are not pregnant and choose to drink alcohol, do not have more than 1 drink per day. One drink is considered to be 12 ounces (355 mL) of beer, 5 ounces (148 mL) of wine, or 1.5 ounces (44 mL) of liquor.  Avoid use of street drugs. Do not share needles with anyone. Ask for help if you need support or instructions about stopping the use of drugs.  High blood pressure causes heart disease and increases the risk of  stroke. Your blood pressure should be checked at least every 1 to 2 years. Ongoing high blood pressure should be treated with medicines if weight loss and exercise do not work.  If you are 3-86 years old, ask your health care provider if you should take aspirin to prevent strokes.  Diabetes screening involves taking a blood sample to check your fasting blood sugar level. This should be done once every 3 years, after age 67, if you are within normal weight and without risk factors for diabetes. Testing should be considered at a younger age or be carried out more frequently if you are overweight and have at least 1 risk factor for diabetes.  Breast cancer screening is essential preventive care for women. You should practice "breast self-awareness." This means understanding the normal appearance and feel of your breasts and may include breast self-examination. Any changes detected, no matter how small, should be reported to a health care provider. Women in their 8s and 30s should have a clinical breast exam (CBE) by a health care provider as part of a regular health exam every 1 to 3 years. After age 70, women should have a CBE every year. Starting at age 25, women should consider having a mammogram (breast X-ray test) every year. Women who have a family history of breast cancer should talk to their health care provider about genetic screening. Women at a high risk of breast cancer should talk to their health care providers about having an MRI and a mammogram every year.  Breast cancer gene (BRCA)-related cancer risk assessment is recommended for women who have family members with BRCA-related cancers. BRCA-related cancers include breast, ovarian, tubal, and peritoneal cancers. Having family members with these cancers may be associated with an increased risk for harmful changes (mutations) in the breast cancer genes BRCA1 and BRCA2. Results of the assessment will determine the need for genetic counseling and  BRCA1 and BRCA2 testing.  Routine pelvic exams to screen for cancer are no longer recommended for nonpregnant women who are considered low risk for cancer of the pelvic organs (ovaries, uterus, and vagina) and who do not have symptoms. Ask your health care provider if a screening pelvic exam is right for you.  If you have had past treatment for cervical cancer or a condition that could lead to cancer, you need Pap tests and screening for cancer for at least 20 years after your treatment. If Pap tests have been discontinued, your risk factors (such as having a new sexual partner) need to be reassessed to determine if screening should be resumed. Some women have medical problems that increase the chance of getting cervical cancer. In these cases, your health care provider may recommend more frequent screening and Pap tests.  The HPV test is an additional test that may be used for cervical cancer screening. The HPV test looks for the virus that can cause the cell changes on the cervix. The cells collected during the Pap test can be  tested for HPV. The HPV test could be used to screen women aged 30 years and older, and should be used in women of any age who have unclear Pap test results. After the age of 30, women should have HPV testing at the same frequency as a Pap test.  Colorectal cancer can be detected and often prevented. Most routine colorectal cancer screening begins at the age of 50 years and continues through age 75 years. However, your health care provider may recommend screening at an earlier age if you have risk factors for colon cancer. On a yearly basis, your health care provider may provide home test kits to check for hidden blood in the stool. Use of a small camera at the end of a tube, to directly examine the colon (sigmoidoscopy or colonoscopy), can detect the earliest forms of colorectal cancer. Talk to your health care provider about this at age 50, when routine screening begins. Direct  exam of the colon should be repeated every 5-10 years through age 75 years, unless early forms of pre-cancerous polyps or small growths are found.  People who are at an increased risk for hepatitis B should be screened for this virus. You are considered at high risk for hepatitis B if:  You were born in a country where hepatitis B occurs often. Talk with your health care provider about which countries are considered high risk.  Your parents were born in a high-risk country and you have not received a shot to protect against hepatitis B (hepatitis B vaccine).  You have HIV or AIDS.  You use needles to inject street drugs.  You live with, or have sex with, someone who has hepatitis B.  You get hemodialysis treatment.  You take certain medicines for conditions like cancer, organ transplantation, and autoimmune conditions.  Hepatitis C blood testing is recommended for all people born from 1945 through 1965 and any individual with known risks for hepatitis C.  Practice safe sex. Use condoms and avoid high-risk sexual practices to reduce the spread of sexually transmitted infections (STIs). STIs include gonorrhea, chlamydia, syphilis, trichomonas, herpes, HPV, and human immunodeficiency virus (HIV). Herpes, HIV, and HPV are viral illnesses that have no cure. They can result in disability, cancer, and death.  You should be screened for sexually transmitted illnesses (STIs) including gonorrhea and chlamydia if:  You are sexually active and are younger than 24 years.  You are older than 24 years and your health care provider tells you that you are at risk for this type of infection.  Your sexual activity has changed since you were last screened and you are at an increased risk for chlamydia or gonorrhea. Ask your health care provider if you are at risk.  If you are at risk of being infected with HIV, it is recommended that you take a prescription medicine daily to prevent HIV infection. This is  called preexposure prophylaxis (PrEP). You are considered at risk if:  You are a heterosexual woman, are sexually active, and are at increased risk for HIV infection.  You take drugs by injection.  You are sexually active with a partner who has HIV.  Talk with your health care provider about whether you are at high risk of being infected with HIV. If you choose to begin PrEP, you should first be tested for HIV. You should then be tested every 3 months for as long as you are taking PrEP.  Osteoporosis is a disease in which the bones lose minerals and strength   with aging. This can result in serious bone fractures or breaks. The risk of osteoporosis can be identified using a bone density scan. Women ages 65 years and over and women at risk for fractures or osteoporosis should discuss screening with their health care providers. Ask your health care provider whether you should take a calcium supplement or vitamin D to reduce the rate of osteoporosis.  Menopause can be associated with physical symptoms and risks. Hormone replacement therapy is available to decrease symptoms and risks. You should talk to your health care provider about whether hormone replacement therapy is right for you.  Use sunscreen. Apply sunscreen liberally and repeatedly throughout the day. You should seek shade when your shadow is shorter than you. Protect yourself by wearing long sleeves, pants, a wide-brimmed hat, and sunglasses year round, whenever you are outdoors.  Once a month, do a whole body skin exam, using a mirror to look at the skin on your back. Tell your health care provider of new moles, moles that have irregular borders, moles that are larger than a pencil eraser, or moles that have changed in shape or color.  Stay current with required vaccines (immunizations).  Influenza vaccine. All adults should be immunized every year.  Tetanus, diphtheria, and acellular pertussis (Td, Tdap) vaccine. Pregnant women should  receive 1 dose of Tdap vaccine during each pregnancy. The dose should be obtained regardless of the length of time since the last dose. Immunization is preferred during the 27th-36th week of gestation. An adult who has not previously received Tdap or who does not know her vaccine status should receive 1 dose of Tdap. This initial dose should be followed by tetanus and diphtheria toxoids (Td) booster doses every 10 years. Adults with an unknown or incomplete history of completing a 3-dose immunization series with Td-containing vaccines should begin or complete a primary immunization series including a Tdap dose. Adults should receive a Td booster every 10 years.  Varicella vaccine. An adult without evidence of immunity to varicella should receive 2 doses or a second dose if she has previously received 1 dose. Pregnant females who do not have evidence of immunity should receive the first dose after pregnancy. This first dose should be obtained before leaving the health care facility. The second dose should be obtained 4-8 weeks after the first dose.  Human papillomavirus (HPV) vaccine. Females aged 13-26 years who have not received the vaccine previously should obtain the 3-dose series. The vaccine is not recommended for use in pregnant females. However, pregnancy testing is not needed before receiving a dose. If a female is found to be pregnant after receiving a dose, no treatment is needed. In that case, the remaining doses should be delayed until after the pregnancy. Immunization is recommended for any person with an immunocompromised condition through the age of 26 years if she did not get any or all doses earlier. During the 3-dose series, the second dose should be obtained 4-8 weeks after the first dose. The third dose should be obtained 24 weeks after the first dose and 16 weeks after the second dose.  Zoster vaccine. One dose is recommended for adults aged 60 years or older unless certain conditions are  present.  Measles, mumps, and rubella (MMR) vaccine. Adults born before 1957 generally are considered immune to measles and mumps. Adults born in 1957 or later should have 1 or more doses of MMR vaccine unless there is a contraindication to the vaccine or there is laboratory evidence of immunity to   each of the three diseases. A routine second dose of MMR vaccine should be obtained at least 28 days after the first dose for students attending postsecondary schools, health care workers, or international travelers. People who received inactivated measles vaccine or an unknown type of measles vaccine during 1963-1967 should receive 2 doses of MMR vaccine. People who received inactivated mumps vaccine or an unknown type of mumps vaccine before 1979 and are at high risk for mumps infection should consider immunization with 2 doses of MMR vaccine. For females of childbearing age, rubella immunity should be determined. If there is no evidence of immunity, females who are not pregnant should be vaccinated. If there is no evidence of immunity, females who are pregnant should delay immunization until after pregnancy. Unvaccinated health care workers born before 1957 who lack laboratory evidence of measles, mumps, or rubella immunity or laboratory confirmation of disease should consider measles and mumps immunization with 2 doses of MMR vaccine or rubella immunization with 1 dose of MMR vaccine.  Pneumococcal 13-valent conjugate (PCV13) vaccine. When indicated, a person who is uncertain of her immunization history and has no record of immunization should receive the PCV13 vaccine. An adult aged 19 years or older who has certain medical conditions and has not been previously immunized should receive 1 dose of PCV13 vaccine. This PCV13 should be followed with a dose of pneumococcal polysaccharide (PPSV23) vaccine. The PPSV23 vaccine dose should be obtained at least 8 weeks after the dose of PCV13 vaccine. An adult aged 19  years or older who has certain medical conditions and previously received 1 or more doses of PPSV23 vaccine should receive 1 dose of PCV13. The PCV13 vaccine dose should be obtained 1 or more years after the last PPSV23 vaccine dose.  Pneumococcal polysaccharide (PPSV23) vaccine. When PCV13 is also indicated, PCV13 should be obtained first. All adults aged 65 years and older should be immunized. An adult younger than age 65 years who has certain medical conditions should be immunized. Any person who resides in a nursing home or long-term care facility should be immunized. An adult smoker should be immunized. People with an immunocompromised condition and certain other conditions should receive both PCV13 and PPSV23 vaccines. People with human immunodeficiency virus (HIV) infection should be immunized as soon as possible after diagnosis. Immunization during chemotherapy or radiation therapy should be avoided. Routine use of PPSV23 vaccine is not recommended for American Indians, [place] Natives, or people younger than 65 years unless there are medical conditions that require PPSV23 vaccine. When indicated, people who have unknown immunization and have no record of immunization should receive PPSV23 vaccine. One-time revaccination 5 years after the first dose of PPSV23 is recommended for people aged 19-64 years who have chronic kidney failure, nephrotic syndrome, asplenia, or immunocompromised conditions. People who received 1-2 doses of PPSV23 before age 65 years should receive another dose of PPSV23 vaccine at age 65 years or later if at least 5 years have passed since the previous dose. Doses of PPSV23 are not needed for people immunized with PPSV23 at or after age 65 years.  Meningococcal vaccine. Adults with asplenia or persistent complement component deficiencies should receive 2 doses of quadrivalent meningococcal conjugate (MenACWY-D) vaccine. The doses should be obtained at least 2 months apart.  Microbiologists working with certain meningococcal bacteria, military recruits, people at risk during an outbreak, and people who travel to or live in countries with a high rate of meningitis should be immunized. A first-year college student up through age   21 years who is living in a residence hall should receive a dose if she did not receive a dose on or after her 16th birthday. Adults who have certain high-risk conditions should receive one or more doses of vaccine.  Hepatitis A vaccine. Adults who wish to be protected from this disease, have certain high-risk conditions, work with hepatitis A-infected animals, work in hepatitis A research labs, or travel to or work in countries with a high rate of hepatitis A should be immunized. Adults who were previously unvaccinated and who anticipate close contact with an international adoptee during the first 60 days after arrival in the [place] from a country with a high rate of hepatitis A should be immunized.  Hepatitis B vaccine. Adults who wish to be protected from this disease, have certain high-risk conditions, may be exposed to blood or other infectious body fluids, are household contacts or sex partners of hepatitis B positive people, are clients or workers in certain care facilities, or travel to or work in countries with a high rate of hepatitis B should be immunized.  Haemophilus influenzae type b (Hib) vaccine. A previously unvaccinated person with asplenia or sickle cell disease or having a scheduled splenectomy should receive 1 dose of Hib vaccine. Regardless of previous immunization, a recipient of a hematopoietic stem cell transplant should receive a 3-dose series 6-12 months after her successful transplant. Hib vaccine is not recommended for adults with HIV infection. Preventive Services / Frequency Ages 64 to 68 years  Blood pressure check.** / Every 1 to 2 years.  Lipid and cholesterol check.** / Every 5 years beginning at age  22.  Clinical breast exam.** / Every 3 years for women in their 88s and 53s.  BRCA-related cancer risk assessment.** / For women who have family members with a BRCA-related cancer (breast, ovarian, tubal, or peritoneal cancers).  Pap test.** / Every 2 years from ages 90 through 51. Every 3 years starting at age 21 through age 56 or 3 with a history of 3 consecutive normal Pap tests.  HPV screening.** / Every 3 years from ages 24 through ages 1 to 46 with a history of 3 consecutive normal Pap tests.  Hepatitis C blood test.** / For any individual with known risks for hepatitis C.  Skin self-exam. / Monthly.  Influenza vaccine. / Every year.  Tetanus, diphtheria, and acellular pertussis (Tdap, Td) vaccine.** / Consult your health care provider. Pregnant women should receive 1 dose of Tdap vaccine during each pregnancy. 1 dose of Td every 10 years.  Varicella vaccine.** / Consult your health care provider. Pregnant females who do not have evidence of immunity should receive the first dose after pregnancy.  HPV vaccine. / 3 doses over 6 months, if 72 and younger. The vaccine is not recommended for use in pregnant females. However, pregnancy testing is not needed before receiving a dose.  Measles, mumps, rubella (MMR) vaccine.** / You need at least 1 dose of MMR if you were born in 1957 or later. You may also need a 2nd dose. For females of childbearing age, rubella immunity should be determined. If there is no evidence of immunity, females who are not pregnant should be vaccinated. If there is no evidence of immunity, females who are pregnant should delay immunization until after pregnancy.  Pneumococcal 13-valent conjugate (PCV13) vaccine.** / Consult your health care provider.  Pneumococcal polysaccharide (PPSV23) vaccine.** / 1 to 2 doses if you smoke cigarettes or if you have certain conditions.  Meningococcal vaccine.** /  1 dose if you are age 19 to 21 years and a first-year college  student living in a residence hall, or have one of several medical conditions, you need to get vaccinated against meningococcal disease. You may also need additional booster doses.  Hepatitis A vaccine.** / Consult your health care provider.  Hepatitis B vaccine.** / Consult your health care provider.  Haemophilus influenzae type b (Hib) vaccine.** / Consult your health care provider. Ages 40 to 64 years  Blood pressure check.** / Every 1 to 2 years.  Lipid and cholesterol check.** / Every 5 years beginning at age 20 years.  Lung cancer screening. / Every year if you are aged 55-80 years and have a 30-pack-year history of smoking and currently smoke or have quit within the past 15 years. Yearly screening is stopped once you have quit smoking for at least 15 years or develop a health problem that would prevent you from having lung cancer treatment.  Clinical breast exam.** / Every year after age 40 years.  BRCA-related cancer risk assessment.** / For women who have family members with a BRCA-related cancer (breast, ovarian, tubal, or peritoneal cancers).  Mammogram.** / Every year beginning at age 40 years and continuing for as long as you are in good health. Consult with your health care provider.  Pap test.** / Every 3 years starting at age 30 years through age 65 or 70 years with a history of 3 consecutive normal Pap tests.  HPV screening.** / Every 3 years from ages 30 years through ages 65 to 70 years with a history of 3 consecutive normal Pap tests.  Fecal occult blood test (FOBT) of stool. / Every year beginning at age 50 years and continuing until age 75 years. You may not need to do this test if you get a colonoscopy every 10 years.  Flexible sigmoidoscopy or colonoscopy.** / Every 5 years for a flexible sigmoidoscopy or every 10 years for a colonoscopy beginning at age 50 years and continuing until age 75 years.  Hepatitis C blood test.** / For all people born from 1945 through  1965 and any individual with known risks for hepatitis C.  Skin self-exam. / Monthly.  Influenza vaccine. / Every year.  Tetanus, diphtheria, and acellular pertussis (Tdap/Td) vaccine.** / Consult your health care provider. Pregnant women should receive 1 dose of Tdap vaccine during each pregnancy. 1 dose of Td every 10 years.  Varicella vaccine.** / Consult your health care provider. Pregnant females who do not have evidence of immunity should receive the first dose after pregnancy.  Zoster vaccine.** / 1 dose for adults aged 60 years or older.  Measles, mumps, rubella (MMR) vaccine.** / You need at least 1 dose of MMR if you were born in 1957 or later. You may also need a 2nd dose. For females of childbearing age, rubella immunity should be determined. If there is no evidence of immunity, females who are not pregnant should be vaccinated. If there is no evidence of immunity, females who are pregnant should delay immunization until after pregnancy.  Pneumococcal 13-valent conjugate (PCV13) vaccine.** / Consult your health care provider.  Pneumococcal polysaccharide (PPSV23) vaccine.** / 1 to 2 doses if you smoke cigarettes or if you have certain conditions.  Meningococcal vaccine.** / Consult your health care provider.  Hepatitis A vaccine.** / Consult your health care provider.  Hepatitis B vaccine.** / Consult your health care provider.  Haemophilus influenzae type b (Hib) vaccine.** / Consult your health care provider. Ages 65   years and over  Blood pressure check.** / Every 1 to 2 years.  Lipid and cholesterol check.** / Every 5 years beginning at age 22 years.  Lung cancer screening. / Every year if you are aged 73-80 years and have a 30-pack-year history of smoking and currently smoke or have quit within the past 15 years. Yearly screening is stopped once you have quit smoking for at least 15 years or develop a health problem that would prevent you from having lung cancer  treatment.  Clinical breast exam.** / Every year after age 4 years.  BRCA-related cancer risk assessment.** / For women who have family members with a BRCA-related cancer (breast, ovarian, tubal, or peritoneal cancers).  Mammogram.** / Every year beginning at age 40 years and continuing for as long as you are in good health. Consult with your health care provider.  Pap test.** / Every 3 years starting at age 9 years through age 34 or 91 years with 3 consecutive normal Pap tests. Testing can be stopped between 65 and 70 years with 3 consecutive normal Pap tests and no abnormal Pap or HPV tests in the past 10 years.  HPV screening.** / Every 3 years from ages 57 years through ages 64 or 45 years with a history of 3 consecutive normal Pap tests. Testing can be stopped between 65 and 70 years with 3 consecutive normal Pap tests and no abnormal Pap or HPV tests in the past 10 years.  Fecal occult blood test (FOBT) of stool. / Every year beginning at age 15 years and continuing until age 17 years. You may not need to do this test if you get a colonoscopy every 10 years.  Flexible sigmoidoscopy or colonoscopy.** / Every 5 years for a flexible sigmoidoscopy or every 10 years for a colonoscopy beginning at age 86 years and continuing until age 71 years.  Hepatitis C blood test.** / For all people born from [birth year] through 1963 and any individual with known risks for hepatitis C.  Osteoporosis screening.** / A one-time screening for women ages 83 years and over and women at risk for fractures or osteoporosis.  Skin self-exam. / Monthly.  Influenza vaccine. / Every year.  Tetanus, diphtheria, and acellular pertussis (Tdap/Td) vaccine.** / 1 dose of Td every 10 years.  Varicella vaccine.** / Consult your health care provider.  Zoster vaccine.** / 1 dose for adults aged 61 years or older.  Pneumococcal 13-valent conjugate (PCV13) vaccine.** / Consult your health care provider.  Pneumococcal  polysaccharide (PPSV23) vaccine.** / 1 dose for all adults aged 28 years and older.  Meningococcal vaccine.** / Consult your health care provider.  Hepatitis A vaccine.** / Consult your health care provider.  Hepatitis B vaccine.** / Consult your health care provider.  Haemophilus influenzae type b (Hib) vaccine.** / Consult your health care provider. ** Family history and personal history of risk and conditions may change your health care provider's recommendations. Document Released: 08/21/2001 Document Revised: 11/09/2013 Document Reviewed: 11/20/2010 [facility] Patient Information 2015 [place], [place]. This information is not intended to replace advice given to you by your health care provider. Make sure you discuss any questions you have with your health care provider.

## 2014-03-23 NOTE — Progress Notes (Signed)
   Subjective:     HPI  The patient is here for a wellness exam. The patient has been doing well overall without major physical or psychological issues going on lately.  The patient needs to address  chronic asthma that has been well controlled with medicines; to address chronic  Hot flashes not controlled with medicines well; and to address GERD, osteoporosis, controlled with medical treatment and diet.   BP Readings from Last 3 Encounters:  03/23/14 130/82  10/21/13 165/72  09/17/13 142/88   Wt Readings from Last 3 Encounters:  03/23/14 121 lb (54.885 kg)  10/21/13 121 lb (54.885 kg)  10/06/13 121 lb (54.885 kg)      Review of Systems  Constitutional: Negative for fever, chills, diaphoresis, activity change, appetite change, fatigue and unexpected weight change.  HENT: Negative for congestion, dental problem, ear pain, hearing loss, mouth sores, postnasal drip, sinus pressure, sneezing, sore throat and voice change.   Eyes: Negative for pain and visual disturbance.  Respiratory: Negative for cough, chest tightness, wheezing and stridor.   Cardiovascular: Negative for chest pain, palpitations and leg swelling.  Gastrointestinal: Negative for nausea, vomiting, abdominal pain, blood in stool, abdominal distention and rectal pain.  Genitourinary: Negative for dysuria, hematuria, decreased urine volume, vaginal bleeding, vaginal discharge, difficulty urinating and vaginal pain.  Musculoskeletal: Negative for back pain, gait problem, joint swelling and neck pain.  Skin: Negative for color change, rash and wound.  Neurological: Negative for dizziness, tremors, syncope, speech difficulty and light-headedness.  Hematological: Negative for adenopathy.  Psychiatric/Behavioral: Negative for suicidal ideas, hallucinations, behavioral problems, confusion, sleep disturbance, dysphoric mood and decreased concentration. The patient is not hyperactive.        Objective:   Physical Exam   Constitutional: She appears well-developed and well-nourished. No distress.  HENT:  Head: Normocephalic.  Right Ear: External ear normal.  Left Ear: External ear normal.  Nose: Nose normal.  Mouth/Throat: Oropharynx is clear and moist.  Eyes: Conjunctivae are normal. Pupils are equal, round, and reactive to light. Right eye exhibits no discharge. Left eye exhibits no discharge.  Neck: Normal range of motion. Neck supple. No JVD present. No tracheal deviation present. No thyromegaly present.  Cardiovascular: Normal rate and normal heart sounds.   Irreg beats  Pulmonary/Chest: No stridor. No respiratory distress. She has no wheezes.  Abdominal: Soft. Bowel sounds are normal. She exhibits no distension and no mass. There is no tenderness. There is no rebound and no guarding.  Musculoskeletal: She exhibits no edema and no tenderness.  Lymphadenopathy:    She has no cervical adenopathy.  Neurological: She displays normal reflexes. No cranial nerve deficit. She exhibits normal muscle tone. Coordination normal.  Skin: No rash noted. No erythema.  Psychiatric: She has a normal mood and affect. Her behavior is normal. Judgment and thought content normal.   Lab Results  Component Value Date   WBC 6.6 03/17/2013   HGB 13.4 03/17/2013   HCT 39.7 03/17/2013   PLT 286.0 03/17/2013   CHOL 243* 03/17/2013   TRIG 52.0 03/17/2013   HDL 136.70 03/17/2013   LDLDIRECT 104.0 03/17/2013   ALT 16 03/17/2013   AST 21 03/17/2013   NA 139 03/17/2013   K 4.2 03/17/2013   CL 104 03/17/2013   CREATININE 0.8 03/17/2013   BUN 14 03/17/2013   CO2 28 03/17/2013   TSH 3.11 03/17/2013   HGBA1C 5.1 03/17/2013          Assessment & Plan:

## 2014-03-23 NOTE — Progress Notes (Signed)
Pre visit review using our clinic review tool, if applicable. No additional management support is needed unless otherwise documented below in the visit note. 

## 2014-03-23 NOTE — Assessment & Plan Note (Signed)
BP is nl at home 

## 2014-03-23 NOTE — Assessment & Plan Note (Signed)
Continue with current prescription therapy as reflected on the Med list.  

## 2014-03-23 NOTE — Assessment & Plan Note (Signed)
The patient is here for annual Medicare wellness examination and management of other chronic and acute problems.   The risk factors are reflected in the social history.  The roster of all physicians providing medical care to patient - is listed in the Snapshot section of the chart.  Activities of daily living:  The patient is 100% inedpendent in all ADLs: dressing, toileting, feeding as well as independent mobility  Home safety : The patient has smoke detectors in the home. They wear seatbelts.No firearms at home ( firearms are present in the home, kept in a safe fashion). There is no violence in the home.   There is no risks for hepatitis, STDs or HIV. There is no   history of blood transfusion. They have no travel history to infectious disease endemic areas of the world.  The patient has (has not) seen their dentist in the last six month. They have (not) seen their eye doctor in the last year. They deny (admit to) any hearing difficulty and have not had audiologic testing in the last year.  They do not  have excessive sun exposure. Discussed the need for sun protection: hats, long sleeves and use of sunscreen if there is significant sun exposure.   Diet: the importance of a healthy diet is discussed. They do have a healthy (unhealthy-high fat/fast food) diet.  The patient has no regular exercise program - yardwork. The benefits of regular aerobic exercise were discussed.  Depression screen: there are no signs or vegative symptoms of depression- irritability, change in appetite, anhedonia, sadness/tearfullness.  Cognitive assessment: the patient manages all their financial and personal affairs and is actively engaged. They could relate day,date,year and events; recalled 3/3 objects at 3 minutes; performed clock-face test normally.  The following portions of the patient's history were reviewed and updated as appropriate: allergies, current medications, past family history, past medical history,   past surgical history, past social history  and problem list.  Vision, hearing, body mass index were assessed and reviewed.   During the course of the visit the patient was educated and counseled about appropriate screening and preventive services including : fall prevention , diabetes screening, nutrition counseling, colorectal cancer screening, and recommended immunizations.

## 2014-03-24 LAB — CBC WITH DIFFERENTIAL/PLATELET
Basophils Absolute: 0.1 10*3/uL (ref 0.0–0.1)
Basophils Relative: 0.6 % (ref 0.0–3.0)
EOS ABS: 0.3 10*3/uL (ref 0.0–0.7)
Eosinophils Relative: 4 % (ref 0.0–5.0)
HEMATOCRIT: 38.4 % (ref 36.0–46.0)
Hemoglobin: 12.9 g/dL (ref 12.0–15.0)
LYMPHS ABS: 2.3 10*3/uL (ref 0.7–4.0)
Lymphocytes Relative: 27.9 % (ref 12.0–46.0)
MCHC: 33.6 g/dL (ref 30.0–36.0)
MCV: 95.4 fl (ref 78.0–100.0)
MONO ABS: 0.1 10*3/uL (ref 0.1–1.0)
Monocytes Relative: 0.7 % — ABNORMAL LOW (ref 3.0–12.0)
NEUTROS PCT: 66.8 % (ref 43.0–77.0)
Neutro Abs: 5.4 10*3/uL (ref 1.4–7.7)
Platelets: 254 10*3/uL (ref 150.0–400.0)
RBC: 4.03 Mil/uL (ref 3.87–5.11)
RDW: 12.6 % (ref 11.5–15.5)
WBC: 8.1 10*3/uL (ref 4.0–10.5)

## 2014-07-07 ENCOUNTER — Ambulatory Visit (INDEPENDENT_AMBULATORY_CARE_PROVIDER_SITE_OTHER): Payer: Medicare Other | Admitting: Internal Medicine

## 2014-07-07 ENCOUNTER — Other Ambulatory Visit: Payer: Self-pay | Admitting: Internal Medicine

## 2014-07-07 ENCOUNTER — Ambulatory Visit (INDEPENDENT_AMBULATORY_CARE_PROVIDER_SITE_OTHER)
Admission: RE | Admit: 2014-07-07 | Discharge: 2014-07-07 | Disposition: A | Discharge status: Home / Self Care | Payer: Medicare Other | Source: Ambulatory Visit | Attending: Internal Medicine | Admitting: Internal Medicine

## 2014-07-07 ENCOUNTER — Encounter: Payer: Self-pay | Admitting: Internal Medicine

## 2014-07-07 VITALS — BP 120/70 | HR 86 | Temp 97.6°F | Ht 63.0 in | Wt 119.2 lb

## 2014-07-07 DIAGNOSIS — M5413 Radiculopathy, cervicothoracic region: Secondary | ICD-10-CM

## 2014-07-07 MED ORDER — GABAPENTIN 100 MG PO CAPS: ORAL_CAPSULE | ORAL | Status: DC

## 2014-07-07 NOTE — Patient Instructions (Signed)
Your next office appointment will be determined based upon review of your pending  x-rays. Those instructions will be transmitted to you by mail. Followup as needed for your acute issue. Please report any significant change in your symptoms. 

## 2014-07-07 NOTE — Progress Notes (Signed)
Pre visit review using our clinic review tool, if applicable. No additional management support is needed unless otherwise documented below in the visit note. 

## 2014-07-07 NOTE — Progress Notes (Signed)
   Subjective:    Patient ID: Cindy Meza, female    DOB: 23-Aug-1935, 78 y.o.   MRN: [id number]  HPI She describes tingling and numbness along the lateral aspect of the left chest and from the left shoulder into the left hand.  This is in the context of having raked leaves over several weeks intermittently  The symptoms are lasting minutes and seem to be affected by position. It seems to be worse when she is in the left lateral decubitus position. Symptoms are not exertional.     Review of Systems She denies fever, chills, sweats, weight loss.  She has had no change in vision such as blurring, double vision, loss of vision  There's been no loss of sense of smell or taste  She denies any tinnitus or hearing loss  There is no associated chest pain or shortness of breath  She has no lower extremity symptoms  There's been no incontinence of urine or stool  She's noted no rash or change in temperature or color skin in the area the symptoms     Objective:   Physical Exam  Pertinent or positive findings include: She appears younger than her stated age. She is thin but adequately nourished She has very faint arcus senilis There's full range of motion of cervical spine without compromise or pain. There is no discomfort with compression over the crown. There is asymmetry of the upper paraspinous thoracic musculature. The muscles appear to be more prominent on the right suggestion occult scoliosis. There is accentuation of the first and second heart sounds. Cranial nerve exam reveals no deficit.  General appearance :adequately nourished; in no distress. Eyes: No conjunctival inflammation or scleral icterus is present. Oral exam: Dental hygiene is good. Lips and gums are healthy appearing.There is no oropharyngeal erythema or exudate noted.  Heart:  Normal rate and regular rhythm. No gallop, murmur, click, rub or other extra sounds   Lungs:Chest clear to auscultation; no  wheezes, rhonchi,rales ,or rubs present.No increased work of breathing.  Abdomen: bowel sounds normal, soft and non-tender without masses, organomegaly or hernias noted.  No guarding or rebound.  Vascular : all pulses equal ; no bruits present. Skin:Warm & dry.  Intact without suspicious lesions or rashes ; no  tenting Lymphatic: No lymphadenopathy is noted about the head, neck, axilla Strength , tone & DTRs WNL          Assessment & Plan:  #1 cervicothoracic radiculopathy See orders & AVS

## 2014-07-12 ENCOUNTER — Ambulatory Visit: Payer: Medicare Other | Admitting: Family

## 2014-08-13 ENCOUNTER — Encounter: Payer: Self-pay | Admitting: Internal Medicine

## 2014-08-13 ENCOUNTER — Ambulatory Visit (INDEPENDENT_AMBULATORY_CARE_PROVIDER_SITE_OTHER): Payer: Medicare Other | Admitting: Internal Medicine

## 2014-08-13 VITALS — BP 112/80 | HR 84 | Temp 97.5°F | Ht 63.0 in | Wt 120.8 lb

## 2014-08-13 DIAGNOSIS — M5410 Radiculopathy, site unspecified: Secondary | ICD-10-CM

## 2014-08-13 NOTE — Progress Notes (Signed)
   Subjective:    Patient ID: Cindy Meza, female    DOB: 1936/03/25, 79 y.o.   MRN: [id number]  HPI  She continues to have the numbness and tingling intermittently which involves the entire left upper extremity, less so in the hand itself. It also involves the left lateral thorax and can radiate into the left scapular area. It will last a few seconds up to 5-10 minutes. She has noted no trigger such as position or activity.  She states that the episodes are less frequent and also less intense than they were previously.  She filled the gabapentin Rx but was concerned about potential side effects &  did not take the medicine.  The [facility] spine films were reviewed. She does have mild thoracic spondylosis and mild thoracic dextro scoliosis. I also question possible osteopenia as some of the vertebrae appear to have decreased height.  Review of Systems  She denies fever, chills, sweats, weight loss.  She's had no sensory loss involving vision, hearing, or taste. No swallowing dysfunction.  There's been no difficulty swallowing. New progress she has had some tinnitus.  She denies any loss control of bladder or bowels.      Objective:   Physical Exam  Pertinent or positive findings include: She appears younger than stated age is very well dressed & groomed. She does have arcus senilis. There is no evidence of any cranial nerve deficit. Deep tendon reflexes, strength, and tone are normal. No arthritic hand changes.   General appearance :Thin but adequately nourished; in no distress. Eyes: No conjunctival inflammation or scleral icterus is present. Heart:  Normal rate and regular rhythm. S1 and S2 normal without gallop, murmur, click, rub or other extra sounds   Lungs:Chest clear to auscultation; no wheezes, rhonchi,rales ,or rubs present.No increased work of breathing.  MS: no visible scoliosis Vascular : all pulses equal ; no bruits present. Skin:Warm & dry.  Intact without  suspicious lesions or rashes ; no  tenting Lymphatic: No lymphadenopathy is noted about the head, neck, axilla           Assessment & Plan:  #1 polyradiculopathy unilaterally which is improving w/o treatment.  The radiographs were unrevealing. Gabapentin trial has not been initiated.  Plan: I asked her to try the gabapentin 1 at bedtime pending follow-up with neurology.

## 2014-08-13 NOTE — Progress Notes (Signed)
Pre visit review using our clinic review tool, if applicable. No additional management support is needed unless otherwise documented below in the visit note. 

## 2014-08-13 NOTE — Patient Instructions (Signed)
Use a cervical memory foam pillow to prevent hyperextension or hyperflexion of the cervical spine.  The Neurology referral will be scheduled and you'll be notified of the time.Please call the Referral Co-Ordinator @ [phone] if you have not been notified of appointment time within 7-10 days.

## 2014-09-14 ENCOUNTER — Encounter: Payer: Self-pay | Admitting: Neurology

## 2014-09-14 ENCOUNTER — Ambulatory Visit (INDEPENDENT_AMBULATORY_CARE_PROVIDER_SITE_OTHER): Payer: Medicare Other | Admitting: Neurology

## 2014-09-14 DIAGNOSIS — R202 Paresthesia of skin: Secondary | ICD-10-CM

## 2014-09-14 LAB — VITAMIN B12: Vitamin B-12: 578 pg/mL (ref 211–911)

## 2014-09-14 NOTE — Progress Notes (Signed)
[facility] Clinic Note - Initial Visit   Date: 09/14/2014   Cindy Meza MRN: [id number] DOB: Jan 02, 1936   Dear Dr. Linna Darner:  Thank you for your kind referral of Cindy Meza for consultation of left arm parethesias. Although her history is well known to you, please allow [place] to reiterate it for the purpose of our medical record. The patient was accompanied to the clinic by self.    History of Present Illness: Cindy Meza is a 79 y.o. right-handed African-American female with asthma, GERD, and osteoarthritis presenting for evaluation of left arm paresthesias.    Starting in December 2015, she noticed a tingling/numbness sensation over her left arm.  Symptoms are intermittent without any identifiable triggers.  She also complains of left breast pain, described as throbbing which is more apparent at night time.  She tries repositioning herself in the bed which can help.  Denies any weakness.  She endorses sporadic neck pain.  She denies any pain or paresthesias of the feet or right upper extremity.   She was given gabapentin 100mg  qhs, but did not take this due to concerns of side effects.  Out-side paper records, electronic medical record, and images have been reviewed where available and summarized as:  XR thoracic spine 07/07/2014: Mild thoracic spondylosis and mild thoracic dextroscoliosis. No acute abnormality identified.  Lab Results  Component Value Date   HGBA1C 5.1 03/17/2013   Lab Results  Component Value Date   TSH 2.53 03/23/2014   Lab Results  Component Value Date   [id number] 865 07/17/2012    Past Medical History  Diagnosis Date  . Asthma     Dr. Melvyn Novas  . History of colonic polyps [name]    hx of adenomas (small) 1998  . GERD (gastroesophageal reflux disease)   . OA (osteoarthritis)   . Osteopenia   . PAC (premature atrial contraction)   . Breast cyst     Left  . Vitamin D deficiency   . Hemorrhoids   . Cataract      Dr. Gershon Crane    Past Surgical History  Procedure Laterality Date  . Abdominal hysterectomy    . Appendectomy    . Tonsillectomy    . Breast cyst excision    . Colonoscopy       Medications:  Current Outpatient Prescriptions on File Prior to Visit  Medication Sig Dispense Refill  . albuterol (VENTOLIN HFA) 108 (90 BASE) MCG/ACT inhaler Inhale 1-2 puffs into the lungs every 6 (six) hours as needed. 1 Inhaler 11  . alendronate (FOSAMAX) 70 MG tablet Take 70 mg by mouth every 7 (seven) days. Take with a full glass of water on an empty stomach.    . Calcium-Magnesium-Vitamin D (CALCIUM 500 PO) Take 1 each by mouth daily.    . Cholecalciferol 1000 UNITS tablet Take 1,000 Units by mouth daily.      . fluticasone (FLONASE) 50 MCG/ACT nasal spray Place 2 sprays into the nose daily. 16 g 2  . fluticasone-salmeterol (ADVAIR HFA) 115-21 MCG/ACT inhaler Inhale 2 puffs into the lungs 2 (two) times daily. 1 Inhaler 11  . loratadine (CLARITIN) 10 MG tablet Take 10 mg by mouth daily as needed.      . ranitidine (ZANTAC) 150 MG tablet TAKE ONE TABLET BY MOUTH TWICE DAILY 60 tablet 11   No current facility-administered medications on file prior to visit.    Allergies:  Allergies  Allergen Reactions  . Lidocaine Swelling    Of  face and lips.  Novocaine does same thing.  . Risedronate Sodium     Knee pain  . Shellfish Allergy Hives  . Statins     REACTION: pains    Family History: Family History  Problem Relation Age of Onset  . Diabetes Mother 53    Deceased  . Hypertension Other   . Colon cancer Neg Hx   . Pancreatic cancer Neg Hx   . Stomach cancer Neg Hx     Social History: History   Social History  . Marital Status: Widowed    Spouse Name: N/A  . Number of Children: N/A  . Years of Education: N/A   Occupational History  . retired    Social History Main Topics  . Smoking status: Never Smoker   . Smokeless tobacco: Never Used  . Alcohol Use: 0.0 oz/week    0  Standard drinks or equivalent per week     Comment: 3-4 times per week  . Drug Use: No  . Sexual Activity: No   Other Topics Concern  . Not on file   Social History Narrative   She lives alone in a [place] home.  No children.   Retired Education officer, museum.   Highest level of education:  Masters degree    Review of Systems:  CONSTITUTIONAL: No fevers, chills, night sweats, or weight loss.   EYES: No visual changes or eye pain ENT: No hearing changes.  No history of nose bleeds.   RESPIRATORY: No cough, wheezing and shortness of breath.   CARDIOVASCULAR: Negative for chest pain, and palpitations.   GI: Negative for abdominal discomfort, blood in stools or black stools.  No recent change in bowel habits.   GU:  No history of incontinence.   MUSCLOSKELETAL: No history of joint pain or swelling.  No myalgias.   SKIN: Negative for lesions, rash, and itching.   HEMATOLOGY/ONCOLOGY: Negative for prolonged bleeding, bruising easily, and swollen nodes.  No history of cancer.   ENDOCRINE: Negative for cold or heat intolerance, polydipsia or goiter.   PSYCH:  No depression or anxiety symptoms.   NEURO: As Above.   Vital Signs:  BP 132/78 mmHg  Pulse 88  Ht 5\' 3"  (1.6 m)  Wt 120 lb (54.432 kg)  BMI 21.26 kg/m2   General Medical Exam:   General:  Well appearing, comfortable.   Eyes/ENT: see cranial nerve examination.   Neck: No masses appreciated.  Full range of motion without tenderness.  No carotid bruits. Respiratory:  Clear to auscultation, good air entry bilaterally.   Cardiac:  Regular rate and rhythm, no murmur.   Extremities:  No deformities, edema, or skin discoloration.  Skin:  No rashes or lesions.  Neurological Exam: MENTAL STATUS including orientation to time, place, person, recent and remote memory, attention span and concentration, language, and fund of knowledge is normal.  Speech is not dysarthric.  CRANIAL NERVES: II:  No visual field defects.  Unremarkable fundi.     III-IV-VI: Pupils equal round and reactive to light.  Normal conjugate, extra-ocular eye movements in all directions of gaze.  No nystagmus.  No ptosis.   V:  Normal facial sensation.     VII:  Normal facial symmetry and movements.  No pathologic facial reflexes.  VIII:  Normal hearing and vestibular function.   IX-X:  Normal palatal movement.   XI:  Normal shoulder shrug and head rotation.   XII:  Normal tongue strength and range of motion, no deviation or fasciculation.  MOTOR:  No atrophy, fasciculations or abnormal movements.  No pronator drift.  Tone is normal.    Right Upper Extremity:    Left Upper Extremity:    Deltoid  5/5   Deltoid  5/5   Biceps  5/5   Biceps  5/5   Triceps  5/5   Triceps  5/5   Wrist extensors  5/5   Wrist extensors  5/5   Wrist flexors  5/5   Wrist flexors  5/5   Finger extensors  5/5   Finger extensors  5/5   Finger flexors  5/5   Finger flexors  5/5   Dorsal interossei  5/5   Dorsal interossei  5/5   Abductor pollicis  5/5   Abductor pollicis  5/5   Tone (Ashworth scale)  0  Tone (Ashworth scale)  0   Right Lower Extremity:    Left Lower Extremity:    Hip flexors  5/5   Hip flexors  5/5   Hip extensors  5/5   Hip extensors  5/5   Knee flexors  5/5   Knee flexors  5/5   Knee extensors  5/5   Knee extensors  5/5   Dorsiflexors  5/5   Dorsiflexors  5/5   Plantarflexors  5/5   Plantarflexors  5/5   Toe extensors  5/5   Toe extensors  5/5   Toe flexors  5/5   Toe flexors  5/5   Tone (Ashworth scale)  0  Tone (Ashworth scale)  0   MSRs:  Right                                                                 Left brachioradialis 2+  brachioradialis 2+  biceps 2+  biceps 2+  triceps 2+  triceps 2+  patellar 2+  patellar 2+  ankle jerk 1+  ankle jerk 1+  Hoffman no  Hoffman no  plantar response down  plantar response down   SENSORY:  Reduced pin prick over the medial and lateral forearm and asymmetrical right thoracic sensory changes around T10.    COORDINATION/GAIT: Normal finger-to- nose-finger and heel-to-shin.  Intact rapid alternating movements bilaterally.  Able to rise from a chair without using arms.  Gait narrow based and stable. Tandem and stressed gait intact.    IMPRESSION: Mrs. Toda is a 79 year-old female presenting for evaluation of left arm paresthesias which does not fit well to a dermatomal or cutaneous nerve distribution.  Cervical radiculopathy is possible.  I recommended NCS/EMG of the left upper extremity to better localize her symptoms, but patient would like to think about it. Laboratory testing for vitamin B12, copper, and vitamin D will be checked.  Encouraged her to take gabapentin 100mg  qhs to see if this helps her paresthesias.    I am not sure what to make of her T10 sensory changes in the absence of myelopathic features, especially as it would not explain her upper extremity paresthesias.  Regarding her left breast discomfort, this does not sound neurological as pain is more achy and also recommendedhat she update her mammogram.   The duration of this appointment visit was 45 minutes of face-to-face time with the patient.  Greater than 50% of this time was spent in counseling, explanation of diagnosis,  planning of further management, and coordination of care.   Thank you for allowing me to participate in patient's care.  If I can answer any additional questions, I would be pleased to do so.    Sincerely,    Donika K. Posey Pronto, DO

## 2014-09-14 NOTE — Patient Instructions (Addendum)
1.  Check blood work 2.  Please let me know if you would like for [place] to move forward with electrodiagnostic testing 3.  Recommend starting gabapentin 100mg  at bedtime  ELECTROMYOGRAM AND NERVE CONDUCTION STUDIES (EMG/NCS) INSTRUCTIONS  How to Prepare The neurologist conducting the EMG will need to know if you have certain medical conditions. Tell the neurologist and other EMG lab personnel if you: . Have a pacemaker or any other electrical medical device . Take blood-thinning medications . Have hemophilia, a blood-clotting disorder that causes prolonged bleeding Bathing Take a shower or bath shortly before your exam in order to remove oils from your skin. Don't apply lotions or creams before the exam.  What to Expect You'll likely be asked to change into a hospital gown for the procedure and lie down on an examination table. The following explanations can help you understand what will happen during the exam.  . Electrodes. The neurologist or a technician places surface electrodes at various locations on your skin depending on where you're experiencing symptoms. Or the neurologist may insert needle electrodes at different sites depending on your symptoms.  . Sensations. The electrodes will at times transmit a tiny electrical current that you may feel as a twinge or spasm. The needle electrode may cause discomfort or pain that usually ends shortly after the needle is removed. If you are concerned about discomfort or pain, you may want to talk to the neurologist about taking a short break during the exam.  . Instructions. During the needle EMG, the neurologist will assess whether there is any spontaneous electrical activity when the muscle is at rest - activity that isn't present in healthy muscle tissue - and the degree of activity when you slightly contract the muscle.  He or she will give you instructions on resting and contracting a muscle at appropriate times. Depending on what muscles and  nerves the neurologist is examining, he or she may ask you to change positions during the exam.  After your EMG You may experience some temporary, minor bruising where the needle electrode was inserted into your muscle. This bruising should fade within several days. If it persists, contact your primary care doctor.

## 2014-09-15 LAB — VITAMIN D 25 HYDROXY (VIT D DEFICIENCY, FRACTURES): Vit D, 25-Hydroxy: 38 ng/mL (ref 30–100)

## 2014-09-16 LAB — COPPER, SERUM: Copper: 115 ug/dL (ref 70–175)

## 2014-09-21 ENCOUNTER — Ambulatory Visit (INDEPENDENT_AMBULATORY_CARE_PROVIDER_SITE_OTHER): Payer: Medicare Other | Admitting: Internal Medicine

## 2014-09-21 ENCOUNTER — Encounter: Payer: Self-pay | Admitting: Internal Medicine

## 2014-09-21 VITALS — BP 140/88 | HR 71 | Temp 97.4°F | Wt 120.0 lb

## 2014-09-21 DIAGNOSIS — N644 Mastodynia: Secondary | ICD-10-CM

## 2014-09-21 DIAGNOSIS — R202 Paresthesia of skin: Secondary | ICD-10-CM | POA: Insufficient documentation

## 2014-09-21 MED ORDER — NAPROXEN 375 MG PO TABS: 375.0000 mg | ORAL_TABLET | Freq: Every day | ORAL | Status: DC

## 2014-09-21 MED ORDER — VITAMIN E 180 MG (400 UNIT) PO CAPS: 400.0000 [IU] | ORAL_CAPSULE | Freq: Every day | ORAL | Status: DC

## 2014-09-21 NOTE — Progress Notes (Signed)
Subjective:     HPI  F/u L arm paresthesia. Pt had seen Dr Linna Darner and Dr Posey Pronto:   Hx: "Cindy Meza is a 79 year-old female presenting for evaluation of left arm paresthesias which does not fit well to a dermatomal or cutaneous nerve distribution. Cervical radiculopathy is possible. I recommended NCS/EMG of the left upper extremity to better localize her symptoms, but patient would like to think about it. Laboratory testing for vitamin B12, copper, and vitamin D will be checked. Encouraged her to take gabapentin 100mg  qhs to see if this helps her paresthesias.  I am not sure what to make of her T10 sensory changes in the absence of myelopathic features, especially as it would not explain her upper extremity paresthesias. Regarding her left breast discomfort, this does not sound neurological as pain is more achy and also recommendedhat she update her mammogram."  Pt tried gabapentin and it caused dizziness - she stopped  The patient needs to address  chronic asthma that has been well controlled with medicines; to address chronic  Hot flashes not controlled with medicines well; and to address GERD, osteoporosis, controlled with medical treatment and diet.   BP Readings from Last 3 Encounters:  09/21/14 140/88  09/14/14 132/78  08/13/14 112/80   Wt Readings from Last 3 Encounters:  09/21/14 120 lb (54.432 kg)  09/14/14 120 lb (54.432 kg)  08/13/14 120 lb 12 oz (54.772 kg)      Review of Systems  Constitutional: Negative for fever, chills, diaphoresis, activity change, appetite change, fatigue and unexpected weight change.  HENT: Negative for congestion, dental problem, ear pain, hearing loss, mouth sores, postnasal drip, sinus pressure, sneezing, sore throat and voice change.   Eyes: Negative for pain and visual disturbance.  Respiratory: Negative for cough, chest tightness, wheezing and stridor.   Cardiovascular: Negative for chest pain, palpitations and leg swelling.   Gastrointestinal: Negative for nausea, vomiting, abdominal pain, blood in stool, abdominal distention and rectal pain.  Genitourinary: Negative for dysuria, hematuria, decreased urine volume, vaginal bleeding, vaginal discharge, difficulty urinating and vaginal pain.  Musculoskeletal: Negative for back pain, joint swelling, gait problem and neck pain.  Skin: Negative for color change, rash and wound.  Neurological: Negative for dizziness, tremors, syncope, speech difficulty and light-headedness.  Hematological: Negative for adenopathy.  Psychiatric/Behavioral: Negative for suicidal ideas, hallucinations, behavioral problems, confusion, sleep disturbance, dysphoric mood and decreased concentration. The patient is not hyperactive.        Objective:   Physical Exam  Constitutional: She appears well-developed and well-nourished. No distress.  HENT:  Head: Normocephalic.  Right Ear: External ear normal.  Left Ear: External ear normal.  Nose: Nose normal.  Mouth/Throat: Oropharynx is clear and moist.  Eyes: Conjunctivae are normal. Pupils are equal, round, and reactive to light. Right eye exhibits no discharge. Left eye exhibits no discharge.  Neck: Normal range of motion. Neck supple. No JVD present. No tracheal deviation present. No thyromegaly present.  Cardiovascular: Normal rate and normal heart sounds.   Irreg beats  Pulmonary/Chest: No stridor. No respiratory distress. She has no wheezes.  Abdominal: Soft. Bowel sounds are normal. She exhibits no distension and no mass. There is no tenderness. There is no rebound and no guarding.  Musculoskeletal: She exhibits no edema or tenderness.  Lymphadenopathy:    She has no cervical adenopathy.  Neurological: She displays normal reflexes. No cranial nerve deficit. She exhibits normal muscle tone. Coordination normal.  Skin: No rash noted. No erythema.  Psychiatric: She  has a normal mood and affect. Her behavior is normal. Judgment and thought  content normal.  R breast - WNL L breast w/tender and more prominent tissue   Lab Results  Component Value Date   WBC 8.1 03/23/2014   HGB 12.9 03/23/2014   HCT 38.4 03/23/2014   PLT 254.0 03/23/2014   CHOL 256* 03/23/2014   TRIG 54.0 03/23/2014   HDL 129.60 03/23/2014   LDLDIRECT 104.0 03/17/2013   ALT 18 03/23/2014   AST 22 03/23/2014   NA 139 03/23/2014   K 3.9 03/23/2014   CL 102 03/23/2014   CREATININE 0.8 03/23/2014   BUN 16 03/23/2014   CO2 26 03/23/2014   TSH 2.53 03/23/2014   HGBA1C 5.1 03/17/2013          Assessment & Plan:

## 2014-09-21 NOTE — Assessment & Plan Note (Signed)
[place] Vit E, Naproxen Rx D/c Fosamax RTC 2 wks

## 2014-09-21 NOTE — Patient Instructions (Signed)
Stop Fosamax Start Vit E, Naproxen

## 2014-09-21 NOTE — Progress Notes (Signed)
Pre visit review using our clinic review tool, if applicable. No additional management support is needed unless otherwise documented below in the visit note. 

## 2014-09-21 NOTE — Assessment & Plan Note (Signed)
EMG Hold Fosamax

## 2014-10-06 ENCOUNTER — Encounter: Payer: Self-pay | Admitting: Internal Medicine

## 2014-10-06 ENCOUNTER — Ambulatory Visit (INDEPENDENT_AMBULATORY_CARE_PROVIDER_SITE_OTHER): Payer: Medicare Other | Admitting: Internal Medicine

## 2014-10-06 VITALS — BP 130/80 | HR 77 | Wt 121.0 lb

## 2014-10-06 DIAGNOSIS — R202 Paresthesia of skin: Secondary | ICD-10-CM | POA: Diagnosis not present

## 2014-10-06 DIAGNOSIS — R42 Dizziness and giddiness: Secondary | ICD-10-CM | POA: Diagnosis not present

## 2014-10-06 NOTE — Assessment & Plan Note (Signed)
Try coffee or coke or chocolate for lightheadedness

## 2014-10-06 NOTE — Patient Instructions (Signed)
Try coffee or coke or chocolate for lightheadedness

## 2014-10-06 NOTE — Progress Notes (Signed)
Subjective:     HPI  F/u L arm paresthesia - much better since she stopped Fosamax.   Hx: "Cindy Meza is a 79 year-old female presenting for evaluation of left arm paresthesias which does not fit well to a dermatomal or cutaneous nerve distribution. Cervical radiculopathy is possible. I recommended NCS/EMG of the left upper extremity to better localize her symptoms, but patient would like to think about it. Laboratory testing for vitamin B12, copper, and vitamin D will be checked. Encouraged her to take gabapentin 100mg  qhs to see if this helps her paresthesias.  I am not sure what to make of her T10 sensory changes in the absence of myelopathic features, especially as it would not explain her upper extremity paresthesias. Regarding her left breast discomfort, this does not sound neurological as pain is more achy and also recommendedhat she update her mammogram."  Pt tried gabapentin and it caused dizziness - she stopped  The patient needs to address  chronic asthma that has been well controlled with medicines; to address chronic  Hot flashes not controlled with medicines well; and to address GERD, osteoporosis, controlled with medical treatment and diet.   BP Readings from Last 3 Encounters:  10/06/14 130/80  09/21/14 140/88  09/14/14 132/78   Wt Readings from Last 3 Encounters:  10/06/14 121 lb (54.885 kg)  09/21/14 120 lb (54.432 kg)  09/14/14 120 lb (54.432 kg)      Review of Systems  Constitutional: Negative for fever, chills, diaphoresis, activity change, appetite change, fatigue and unexpected weight change.  HENT: Negative for congestion, dental problem, ear pain, hearing loss, mouth sores, postnasal drip, sinus pressure, sneezing, sore throat and voice change.   Eyes: Negative for pain and visual disturbance.  Respiratory: Negative for cough, chest tightness, wheezing and stridor.   Cardiovascular: Negative for chest pain, palpitations and leg swelling.   Gastrointestinal: Negative for nausea, vomiting, abdominal pain, blood in stool, abdominal distention and rectal pain.  Genitourinary: Negative for dysuria, hematuria, decreased urine volume, vaginal bleeding, vaginal discharge, difficulty urinating and vaginal pain.  Musculoskeletal: Negative for back pain, joint swelling, gait problem and neck pain.  Skin: Negative for color change, rash and wound.  Neurological: Negative for dizziness, tremors, syncope, speech difficulty and light-headedness.  Hematological: Negative for adenopathy.  Psychiatric/Behavioral: Negative for suicidal ideas, hallucinations, behavioral problems, confusion, sleep disturbance, dysphoric mood and decreased concentration. The patient is not hyperactive.        Objective:   Physical Exam  Constitutional: She appears well-developed and well-nourished. No distress.  HENT:  Head: Normocephalic.  Right Ear: External ear normal.  Left Ear: External ear normal.  Nose: Nose normal.  Mouth/Throat: Oropharynx is clear and moist.  Eyes: Conjunctivae are normal. Pupils are equal, round, and reactive to light. Right eye exhibits no discharge. Left eye exhibits no discharge.  Neck: Normal range of motion. Neck supple. No JVD present. No tracheal deviation present. No thyromegaly present.  Cardiovascular: Normal rate and normal heart sounds.   Pulmonary/Chest: No stridor. No respiratory distress. She has no wheezes.  Abdominal: Soft. Bowel sounds are normal. She exhibits no distension and no mass. There is no tenderness. There is no rebound and no guarding.  Musculoskeletal: She exhibits no edema or tenderness.  Lymphadenopathy:    She has no cervical adenopathy.  Neurological: She displays normal reflexes. No cranial nerve deficit. She exhibits normal muscle tone. Coordination normal.  Skin: No rash noted. No erythema.  Psychiatric: She has a normal mood and affect.  Her behavior is normal. Judgment and thought content  normal.     Lab Results  Component Value Date   WBC 8.1 03/23/2014   HGB 12.9 03/23/2014   HCT 38.4 03/23/2014   PLT 254.0 03/23/2014   CHOL 256* 03/23/2014   TRIG 54.0 03/23/2014   HDL 129.60 03/23/2014   LDLDIRECT 104.0 03/17/2013   ALT 18 03/23/2014   AST 22 03/23/2014   NA 139 03/23/2014   K 3.9 03/23/2014   CL 102 03/23/2014   CREATININE 0.8 03/23/2014   BUN 16 03/23/2014   CO2 26 03/23/2014   TSH 2.53 03/23/2014   HGBA1C 5.1 03/17/2013          Assessment & Plan:

## 2014-10-06 NOTE — Assessment & Plan Note (Signed)
Much better off Fosamax

## 2014-10-06 NOTE — Progress Notes (Signed)
Pre visit review using our clinic review tool, if applicable. No additional management support is needed unless otherwise documented below in the visit note. 

## 2014-10-11 ENCOUNTER — Telehealth: Payer: Self-pay | Admitting: Internal Medicine

## 2014-10-11 NOTE — Telephone Encounter (Signed)
Patient wants to talk about one of her prescriptions ranitidine (ZANTAC) 150 MG tablet [[id number]. Your still feeling dizziness.

## 2014-10-11 NOTE — Telephone Encounter (Signed)
1.Pt states she thinks Ranitidine is causing dizziness. She has not taken any today. 2. Also, she is c/o arm tingling. Dr. Posey Pronto recommends ELECTROMYOGRAM AND NERVE CONDUCTION STUDIES or MRI.  Pt wants to know which you would suggest. Please advise on both

## 2014-10-12 ENCOUNTER — Telehealth: Payer: Self-pay | Admitting: Neurology

## 2014-10-12 NOTE — Telephone Encounter (Signed)
Pt states that she wants to go ahead with the EMG. Please let me know how long you will need for the EMG and which body part and I will call her to get it sch thanks  Pt phone number is [phone]

## 2014-10-12 NOTE — Telephone Encounter (Signed)
OK to d/c Ranitidine OK EMG - call Dr Posey Pronto pls Thx

## 2014-10-12 NOTE — Telephone Encounter (Signed)
Left upper extremity, 60-min.  Thanks.

## 2014-10-13 ENCOUNTER — Other Ambulatory Visit: Payer: Self-pay | Admitting: *Deleted

## 2014-10-13 DIAGNOSIS — M79602 Pain in left arm: Secondary | ICD-10-CM

## 2014-10-13 NOTE — Telephone Encounter (Signed)
Pt informed

## 2014-10-21 ENCOUNTER — Ambulatory Visit (INDEPENDENT_AMBULATORY_CARE_PROVIDER_SITE_OTHER): Payer: Medicare Other | Admitting: Neurology

## 2014-10-21 DIAGNOSIS — M79602 Pain in left arm: Secondary | ICD-10-CM

## 2014-10-21 NOTE — Procedures (Signed)
[facility]  [place], [place]  [place], [place] [address] Tel: [phone] Fax:  [phone] Test Date:  10/21/2014  Patient: Cindy Meza DOB: 05/18/1936 Physician: Narda Amber  Sex: Female Height: 5\' 3"  Ref Phys: Narda Amber  ID#: [id number] Temp: 32.0C Technician: Laureen Ochs R. NCS T.   Patient Complaints: Patient is a 79 year old female here for evaluation of her left arm paresthesias.  NCV & EMG Findings: Extensive electrodiagnostic testing of the left upper extremity shows:  1. Left median, ulnar, radial, and palmar sensory responses are within normal limits. 2. Left median and ulnar motor responses are within normal limits. 3. Chronic motor axon loss changes are seen affecting the pronator teres and biceps muscles, without accompanied active denervation.  Impression: 1. Chronic C6 radiculopathy affecting the left upper extremity, very mild in degree electrically. 2. There is no evidence of carpal tunnel syndrome affecting the left upper extremity.   ___________________________ Narda Amber    Nerve Conduction Studies Anti Sensory Summary Table   Stim Site NR Peak (ms) Norm Peak (ms) P-T Amp (V) Norm P-T Amp  Left Median Anti Sensory (2nd Digit)  32C  Wrist    3.8  45.9   Left Radial Anti Sensory (Base 1st Digit)  32C  Wrist    2.3 <2.8 41.1 >10  Left Ulnar Anti Sensory (5th Digit)  32C  Wrist    3.1 <3.2 24.8 >5   Motor Summary Table   Stim Site NR Onset (ms) Norm Onset (ms) O-P Amp (mV) Norm O-P Amp Site1 Site2 Delta-0 (ms) Dist (cm) Vel (m/s) Norm Vel (m/s)  Left Median Motor (Abd Poll Brev)  32C  Wrist    3.4 <4.0 9.7 >5 Elbow Wrist 5.2 27.0 52 >50  Elbow    8.6  9.2         Left Ulnar Motor (Abd Dig Minimi)  32C  Wrist    2.5 <3.1 7.6 >7 B Elbow Wrist 3.8 21.0 55 >50  B Elbow    6.3  6.4  A Elbow B Elbow 1.7 10.0 59 >50  A Elbow    8.0  6.3          Comparison Summary Table   Stim Site NR Peak (ms) Norm Peak (ms) P-T  Amp (V) Site1 Site2 Delta-P (ms) Norm Delta (ms)  Left Median/Ulnar Palm Comparison (Wrist - 8cm)  32C  Median Palm    2.0 <2.2 36.1 Median Palm Ulnar Palm 0.2   Ulnar Palm    1.8 <2.2 19.4       EMG   Side Muscle Ins Act Fibs Psw Fasc Number Recrt Dur Dur. Amp Amp. Poly Poly. Comment  Left 1stDorInt Nml Nml Nml Nml Nml Nml Nml Nml Nml Nml Nml Nml N/A  Left Ext Indicis Nml Nml Nml Nml Nml Nml Nml Nml Nml Nml Nml Nml N/A  Left PronatorTeres Nml Nml Nml Nml 1- Mod-R Few 1+ Few 1+ Nml Nml N/A  Left Biceps Nml Nml Nml Nml 1- Mod-R Few 1+ Few 1+ Nml Nml N/A  Left Triceps Nml Nml Nml Nml Nml Nml Nml Nml Nml Nml Nml Nml N/A  Left Deltoid Nml Nml Nml Nml Nml Nml Nml Nml Nml Nml Nml Nml N/A      Waveforms:

## 2014-10-29 ENCOUNTER — Telehealth: Payer: Self-pay | Admitting: Internal Medicine

## 2014-10-29 NOTE — Telephone Encounter (Signed)
Pt called in and is still feeling any better and is still having dizzy spells.  She is requesting call back from nurse  Best number [phone]

## 2014-10-29 NOTE — Telephone Encounter (Signed)
Patient was referred to Dr Posey Pronto, neurology---emg completed on 4/14---i cant really read results of procedure and i cant find any printed documentation to really explain to patient about what she should do next----patient is wanting to know if she should go for MRI---or if she should come back to see you first----but she is still having lightheadedness-----dr patel's office has not called her back yet---please advise, thanks

## 2014-11-01 NOTE — Telephone Encounter (Signed)
Patient will be following dr patels plan, but would the pinched nerve be the reason for lightheadedness/dizziness especially when she bends over or tries to reach upward in her kitchen cabinet---she is wanting to know if she should schedule appt with you for lightheadedness----please advise, thanks

## 2014-11-01 NOTE — Telephone Encounter (Signed)
Ms Acord should f/u w/Dr Posey Pronto re: EMG results and future plans> She does have a pinched nerve. Thx

## 2014-11-02 NOTE — Telephone Encounter (Signed)
Yes, pls: sch OV Thx

## 2014-11-03 ENCOUNTER — Other Ambulatory Visit (INDEPENDENT_AMBULATORY_CARE_PROVIDER_SITE_OTHER): Payer: Medicare Other

## 2014-11-03 ENCOUNTER — Ambulatory Visit (INDEPENDENT_AMBULATORY_CARE_PROVIDER_SITE_OTHER): Payer: Medicare Other | Admitting: Internal Medicine

## 2014-11-03 ENCOUNTER — Encounter: Payer: Self-pay | Admitting: Internal Medicine

## 2014-11-03 VITALS — BP 136/82 | HR 94 | Temp 97.9°F | Ht 63.0 in | Wt 116.8 lb

## 2014-11-03 DIAGNOSIS — R42 Dizziness and giddiness: Secondary | ICD-10-CM

## 2014-11-03 DIAGNOSIS — G450 Vertebro-basilar artery syndrome: Secondary | ICD-10-CM | POA: Diagnosis not present

## 2014-11-03 LAB — BASIC METABOLIC PANEL
BUN: 11 mg/dL (ref 6–23)
CHLORIDE: 101 meq/L (ref 96–112)
CO2: 31 mEq/L (ref 19–32)
CREATININE: 1.05 mg/dL (ref 0.40–1.20)
Calcium: 9.9 mg/dL (ref 8.4–10.5)
GFR: 65.39 mL/min (ref >60.00)
Glucose, Bld: 133 mg/dL — ABNORMAL HIGH (ref 70–99)
POTASSIUM: 3.6 meq/L (ref 3.5–5.1)
Sodium: 136 mEq/L (ref 135–145)

## 2014-11-03 LAB — CBC WITH DIFFERENTIAL/PLATELET
Basophils Absolute: 0 10*3/uL (ref 0.0–0.1)
Basophils Relative: 0.2 % (ref 0.0–3.0)
Eosinophils Absolute: 0.2 10*3/uL (ref 0.0–0.7)
Eosinophils Relative: 2.9 % (ref 0.0–5.0)
HCT: 37.8 % (ref 36.0–46.0)
Hemoglobin: 12.6 g/dL (ref 12.0–15.0)
Lymphocytes Relative: 26.7 % (ref 12.0–46.0)
Lymphs Abs: 1.9 10*3/uL (ref 0.7–4.0)
MCHC: 33.3 g/dL (ref 30.0–36.0)
MCV: 92.3 fl (ref 78.0–100.0)
Monocytes Absolute: 0.6 10*3/uL (ref 0.1–1.0)
Monocytes Relative: 8.4 % (ref 3.0–12.0)
Neutro Abs: 4.4 10*3/uL (ref 1.4–7.7)
Neutrophils Relative %: 61.8 % (ref 43.0–77.0)
PLATELETS: 315 10*3/uL (ref 150.0–400.0)
RBC: 4.09 Mil/uL (ref 3.87–5.11)
RDW: 11.8 % (ref 11.5–15.5)
WBC: 7.2 10*3/uL (ref 4.0–10.5)

## 2014-11-03 NOTE — Telephone Encounter (Signed)
The earliest pt could see you was Friday 4/29, so she decided to schedule appt with dr hopper today at 1:15, thanks

## 2014-11-03 NOTE — Progress Notes (Signed)
   Subjective:    Patient ID: Cindy Meza, female    DOB: Apr 24, 1936, 79 y.o.   MRN: [id number]  HPI Her symptoms began in March of this year with no specific trigger. She describes lightheadedness and sensation of near syncope. This has tended to occur when she rises from the bed or the chair. It also occurs when she hyperextends her head and neck looking overhead. It has occurred as well when she leans forward flexing at the waist to retrieve items. Unrelated is an irregular heartbeat occasionally.  She has no cardiac or neurologic prodrome prior to the events. She has no bleeding dyscrasias   Review of Systems Denied were any change in heart rhythm or rate prior to the event. There was no associated chest pain or shortness of breath . Also specifically denied prior to the episode were headache, limb weakness, tingling, or numbness. No seizure activity noted. Epistaxis, hemoptysis, hematuria, melena, or rectal bleeding denied.No unexplained weight loss, dysphagia, or abdominal pain reported. No persistently small caliber stools  There has been  no abnormal bruising or bleeding. There is no difficulty stopping bleeding with injury.    Objective:   Physical Exam Pertinent or positive findings include:  Affect is flat.  She has wax in both otic canals.  Arcus senilis is noted. With extraocular motion & accommodation is associated with deviation of the left eye inferolaterally.  The first heart sound is increased. She has an S4.  Crepitus is noted in the knees; greater on the right than the left.  Pedal pulses are decreased.  She was slightly unsteady as she stood and walked to and from the door. Heel and toe walking was normal.  Cranial nerve exam revealed no deficit.  General appearance :Thin but adequately nourished; in no distress. Eyes: No conjunctival inflammation or scleral icterus is present. Oral exam:  Lips and gums are healthy appearing.There is no oropharyngeal erythema or  exudate noted. Dental hygiene is good. Heart:  Normal rate and regular rhythm. S2 normal without gallop, murmur, click, rub or other extra sounds   Lungs:Chest clear to auscultation; no wheezes, rhonchi,rales ,or rubs present.No increased work of breathing.  Abdomen: bowel sounds normal, soft and non-tender without masses, organomegaly or hernias noted.  No guarding or rebound.  Vascular : all pulses equal ; no bruits present. Skin:Warm & dry.  Intact without suspicious lesions or rashes ; no tenting  Lymphatic: No lymphadenopathy is noted about the head, neck, axilla Neuro: Strength, tone & DTRs normal.       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

## 2014-11-03 NOTE — Patient Instructions (Signed)
Perform isometric exercise of calves  ( while seated go up on toes to count of 5 & then onto heels for 5 count). Repeat  4- 5 times prior to standing if you've been seated or supine for any significant period of time as BP drops with such positions.   When you hyperextend your neck to look over your head; this compromises the blood flow through the vertebral arteries and the basilar artery the two vertebral arteries join  to form. The basilar artery provides blood flow to the cerebellum, the balance portion of your brain. Typically there is also a component of arthritis in the cervical (neck) spine which helps to compromise the blood flow to the vertebrobasilar system with the neck hyperextended. Unfortunately the only treatment would be to avoid this head and neck position.   Your next office appointment will be determined based upon review of your pending labs  .  Those instructions will be transmitted to you by mail for your records.  Critical results will be called.   Followup as needed for any active or acute issue. Please report any significant change in your symptoms.

## 2014-11-03 NOTE — Progress Notes (Signed)
Pre visit review using our clinic review tool, if applicable. No additional management support is needed unless otherwise documented below in the visit note. 

## 2014-11-03 NOTE — Telephone Encounter (Addendum)
Noted. Thx.

## 2014-11-05 ENCOUNTER — Telehealth: Payer: Self-pay

## 2014-11-05 ENCOUNTER — Other Ambulatory Visit (INDEPENDENT_AMBULATORY_CARE_PROVIDER_SITE_OTHER): Payer: Medicare Other

## 2014-11-05 DIAGNOSIS — R7309 Other abnormal glucose: Secondary | ICD-10-CM | POA: Diagnosis not present

## 2014-11-05 LAB — HEMOGLOBIN A1C: Hgb A1c MFr Bld: 5.2 % (ref 4.6–6.5)

## 2014-11-05 NOTE — Telephone Encounter (Signed)
Request faxed to lab

## 2014-11-05 NOTE — Telephone Encounter (Signed)
-----   Message from Hendricks Limes, MD sent at 11/05/2014  3:17 PM EDT ----- Please add A1c (R73.9)

## 2015-01-05 ENCOUNTER — Encounter: Payer: Self-pay | Admitting: Internal Medicine

## 2015-01-05 ENCOUNTER — Ambulatory Visit (INDEPENDENT_AMBULATORY_CARE_PROVIDER_SITE_OTHER): Payer: Medicare Other | Admitting: Internal Medicine

## 2015-01-05 VITALS — BP 140/90 | HR 83 | Wt 116.0 lb

## 2015-01-05 DIAGNOSIS — E559 Vitamin D deficiency, unspecified: Secondary | ICD-10-CM | POA: Diagnosis not present

## 2015-01-05 DIAGNOSIS — M810 Age-related osteoporosis without current pathological fracture: Secondary | ICD-10-CM

## 2015-01-05 MED ORDER — FLUTICASONE-SALMETEROL 115-21 MCG/ACT IN AERO: 2.0000 | INHALATION_SPRAY | Freq: Two times a day (BID) | RESPIRATORY_TRACT | Status: DC

## 2015-01-05 NOTE — Patient Instructions (Signed)
PROLIA   Core strengthening exercises

## 2015-01-05 NOTE — Assessment & Plan Note (Signed)
On Vit D Core strengthening exercises 

## 2015-01-05 NOTE — Progress Notes (Signed)
   Subjective:     HPI  F/u L arm paresthesia - resolved since she stopped Fosamax.   Pt stopped gabapentin  The patient needs to address  chronic asthma that has been well controlled with medicines; to address chronic  Hot flashes not controlled with medicines well; and to address GERD, osteoporosis, controlled with medical treatment and diet.   BP Readings from Last 3 Encounters:  01/05/15 140/90  11/03/14 136/82  10/06/14 130/80   Wt Readings from Last 3 Encounters:  01/05/15 116 lb (52.617 kg)  11/03/14 116 lb 12 oz (52.957 kg)  10/06/14 121 lb (54.885 kg)      Review of Systems  Constitutional: Negative for fever, chills, diaphoresis, activity change, appetite change, fatigue and unexpected weight change.  HENT: Negative for congestion, dental problem, ear pain, hearing loss, mouth sores, postnasal drip, sinus pressure, sneezing, sore throat and voice change.   Eyes: Negative for pain and visual disturbance.  Respiratory: Negative for cough, chest tightness, wheezing and stridor.   Cardiovascular: Negative for chest pain, palpitations and leg swelling.  Gastrointestinal: Negative for nausea, vomiting, abdominal pain, blood in stool, abdominal distention and rectal pain.  Genitourinary: Negative for dysuria, hematuria, decreased urine volume, vaginal bleeding, vaginal discharge, difficulty urinating and vaginal pain.  Musculoskeletal: Negative for back pain, joint swelling, gait problem and neck pain.  Skin: Negative for color change, rash and wound.  Neurological: Negative for dizziness, tremors, syncope, speech difficulty and light-headedness.  Hematological: Negative for adenopathy.  Psychiatric/Behavioral: Negative for suicidal ideas, hallucinations, behavioral problems, confusion, sleep disturbance, dysphoric mood and decreased concentration. The patient is not hyperactive.        Objective:   Physical Exam  Constitutional: She appears well-developed and  well-nourished. No distress.  HENT:  Head: Normocephalic.  Right Ear: External ear normal.  Left Ear: External ear normal.  Nose: Nose normal.  Mouth/Throat: Oropharynx is clear and moist.  Eyes: Conjunctivae are normal. Pupils are equal, round, and reactive to light. Right eye exhibits no discharge. Left eye exhibits no discharge.  Neck: Normal range of motion. Neck supple. No JVD present. No tracheal deviation present. No thyromegaly present.  Cardiovascular: Normal rate and normal heart sounds.   Pulmonary/Chest: No stridor. No respiratory distress. She has no wheezes.  Abdominal: Soft. Bowel sounds are normal. She exhibits no distension and no mass. There is no tenderness. There is no rebound and no guarding.  Musculoskeletal: She exhibits no edema or tenderness.  Lymphadenopathy:    She has no cervical adenopathy.  Neurological: She displays normal reflexes. No cranial nerve deficit. She exhibits normal muscle tone. Coordination normal.  Skin: No rash noted. No erythema.  Psychiatric: She has a normal mood and affect. Her behavior is normal. Judgment and thought content normal.     Lab Results  Component Value Date   WBC 7.2 11/03/2014   HGB 12.6 11/03/2014   HCT 37.8 11/03/2014   PLT 315.0 11/03/2014   CHOL 256* 03/23/2014   TRIG 54.0 03/23/2014   HDL 129.60 03/23/2014   LDLDIRECT 104.0 03/17/2013   ALT 18 03/23/2014   AST 22 03/23/2014   NA 136 11/03/2014   K 3.6 11/03/2014   CL 101 11/03/2014   CREATININE 1.05 11/03/2014   BUN 11 11/03/2014   CO2 31 11/03/2014   TSH 2.53 03/23/2014   HGBA1C 5.2 11/05/2014          Assessment & Plan:

## 2015-01-05 NOTE — Progress Notes (Signed)
Pre visit review using our clinic review tool, if applicable. No additional management support is needed unless otherwise documented below in the visit note. 

## 2015-01-05 NOTE — Assessment & Plan Note (Signed)
On Vit D Core strengthening exercises Prolia discussed - info given

## 2015-01-24 ENCOUNTER — Encounter: Payer: Self-pay | Admitting: Internal Medicine

## 2015-02-10 ENCOUNTER — Ambulatory Visit (INDEPENDENT_AMBULATORY_CARE_PROVIDER_SITE_OTHER): Payer: Medicare Other | Admitting: Internal Medicine

## 2015-02-10 ENCOUNTER — Encounter: Payer: Self-pay | Admitting: Internal Medicine

## 2015-02-10 ENCOUNTER — Other Ambulatory Visit (INDEPENDENT_AMBULATORY_CARE_PROVIDER_SITE_OTHER): Payer: Medicare Other

## 2015-02-10 VITALS — BP 142/86 | HR 88 | Temp 97.6°F | Resp 16 | Wt 116.0 lb

## 2015-02-10 DIAGNOSIS — R350 Frequency of micturition: Secondary | ICD-10-CM

## 2015-02-10 DIAGNOSIS — R1030 Lower abdominal pain, unspecified: Secondary | ICD-10-CM

## 2015-02-10 DIAGNOSIS — R351 Nocturia: Secondary | ICD-10-CM | POA: Diagnosis not present

## 2015-02-10 LAB — CBC WITH DIFFERENTIAL/PLATELET
BASOS ABS: 0 10*3/uL (ref 0.0–0.1)
Basophils Relative: 0.5 % (ref 0.0–3.0)
Eosinophils Absolute: 0.2 10*3/uL (ref 0.0–0.7)
Eosinophils Relative: 3.1 % (ref 0.0–5.0)
HEMATOCRIT: 38.9 % (ref 36.0–46.0)
Hemoglobin: 13.1 g/dL (ref 12.0–15.0)
LYMPHS PCT: 37.6 % (ref 12.0–46.0)
Lymphs Abs: 2.7 10*3/uL (ref 0.7–4.0)
MCHC: 33.6 g/dL (ref 30.0–36.0)
MCV: 92.8 fl (ref 78.0–100.0)
Monocytes Absolute: 0.5 10*3/uL (ref 0.1–1.0)
Monocytes Relative: 6.6 % (ref 3.0–12.0)
NEUTROS ABS: 3.8 10*3/uL (ref 1.4–7.7)
Neutrophils Relative %: 52.2 % (ref 43.0–77.0)
Platelets: 288 10*3/uL (ref 150.0–400.0)
RBC: 4.19 Mil/uL (ref 3.87–5.11)
RDW: 13.3 % (ref 11.5–15.5)
WBC: 7.3 10*3/uL (ref 4.0–10.5)

## 2015-02-10 LAB — POCT URINALYSIS DIPSTICK
Bilirubin, UA: NEGATIVE
Blood, UA: NEGATIVE
GLUCOSE UA: NEGATIVE
Ketones, UA: NEGATIVE
Leukocytes, UA: NEGATIVE
Nitrite, UA: NEGATIVE
PH UA: 6
PROTEIN UA: NEGATIVE
Spec Grav, UA: 1.01
UROBILINOGEN UA: 0.2

## 2015-02-10 NOTE — Progress Notes (Signed)
Pre visit review using our clinic review tool, if applicable. No additional management support is needed unless otherwise documented below in the visit note. 

## 2015-02-10 NOTE — Progress Notes (Signed)
   Subjective:    Patient ID: Cindy Meza, female    DOB: Oct 13, 1935, 79 y.o.   MRN: [id number]  HPI    She describes suprapubic fullness for the last 6-7 days intermittently with frequency. She describes it as pressure discomfort but not pain. It is intermittent and lasts a few minutes.  Unrelated is chronic nocturia 2-3 times per night.  She has no other GI or genitourinary symptoms.  She denies any significant PMH of genitourinary disease or procedures. She is not diabetic;A1c was 5.2%  In 4/16..  She thought she had lost weight. Since April her weight is down 12 ounces.    Review of Systems  Unexplained weight loss,  significant dyspepsia, dysphagia, constipation, diarrhea, melena, rectal bleeding, or persistently small caliber stools are denied.  Dysuria, pyuria, hematuria, or polyuria are denied.      Objective:   Physical Exam  Pertinent or positive findings include: Breath sounds are somewhat decreased. First heart sound is accentuated. She has crepitus of the knees. Pedal pulses are decreased. General appearance : Thin but adequately nourished; in no distress.  Eyes: No conjunctival inflammation or scleral icterus is present.  Oral exam:  Lips and gums are healthy appearing.There is no oropharyngeal erythema or exudate noted. Dental hygiene is good.  Heart:  Normal rate and regular rhythm. S1 and S2 normal without gallop, murmur, click, rub or other extra sounds    Lungs:Chest clear to auscultation; no wheezes, rhonchi,rales ,or rubs present.No increased work of breathing.   Abdomen: bowel sounds normal, soft and non-tender without masses, organomegaly or hernias noted.  No guarding or rebound. No flank tenderness to percussion.  Vascular : all pulses equal ; no bruits present.  Skin:Warm & dry.  Intact without suspicious lesions or rashes ; no tenting or jaundice   Lymphatic: No lymphadenopathy is noted about the head, neck, axilla, or inguinal areas.   Neuro:  Strength, tone & DTRs normal.         Assessment & Plan:  #1 suprapubic pressure discomfort; urinalysis is normal  #2 chronic nocturia  Plan: See orders and recommendations

## 2015-02-10 NOTE — Patient Instructions (Signed)
Drink as much nondairy fluids as possible. Avoid spicy foods or alcohol as  these may aggravate the bladder . Do not take decongestants. Avoid narcotics if possible.   Your next office appointment will be determined based upon review of your pending labs .  Those written interpretation of the lab results and instructions will be transmitted to you by mail for your records.  Critical results will be called.   Followup as needed for any active or acute issue. Please report any significant change in your symptoms.

## 2015-02-11 LAB — URINE CULTURE
COLONY COUNT: NO GROWTH
ORGANISM ID, BACTERIA: NO GROWTH

## 2015-03-08 ENCOUNTER — Telehealth: Payer: Self-pay

## 2015-03-08 NOTE — Telephone Encounter (Signed)
Call to discuss AWV and the patient stated she has been to so many apts in the last few months and still has apt tomorrow with GYN. Would prefer not to come in this year for AWV but may in the future. ( I will be on PTO the day of her exam )

## 2015-03-25 ENCOUNTER — Encounter: Payer: Medicare Other | Admitting: Internal Medicine

## 2015-03-30 ENCOUNTER — Encounter: Payer: Self-pay | Admitting: Internal Medicine

## 2015-03-30 ENCOUNTER — Ambulatory Visit (INDEPENDENT_AMBULATORY_CARE_PROVIDER_SITE_OTHER): Payer: Medicare Other | Admitting: Internal Medicine

## 2015-03-30 ENCOUNTER — Other Ambulatory Visit (INDEPENDENT_AMBULATORY_CARE_PROVIDER_SITE_OTHER): Payer: Medicare Other

## 2015-03-30 VITALS — BP 132/84 | HR 82 | Ht 63.0 in | Wt 116.0 lb

## 2015-03-30 DIAGNOSIS — M810 Age-related osteoporosis without current pathological fracture: Secondary | ICD-10-CM | POA: Diagnosis not present

## 2015-03-30 DIAGNOSIS — E559 Vitamin D deficiency, unspecified: Secondary | ICD-10-CM | POA: Diagnosis not present

## 2015-03-30 DIAGNOSIS — R202 Paresthesia of skin: Secondary | ICD-10-CM | POA: Diagnosis not present

## 2015-03-30 DIAGNOSIS — Z23 Encounter for immunization: Secondary | ICD-10-CM | POA: Diagnosis not present

## 2015-03-30 DIAGNOSIS — R109 Unspecified abdominal pain: Secondary | ICD-10-CM

## 2015-03-30 DIAGNOSIS — R1030 Lower abdominal pain, unspecified: Secondary | ICD-10-CM | POA: Diagnosis not present

## 2015-03-30 DIAGNOSIS — Z0000 Encounter for general adult medical examination without abnormal findings: Secondary | ICD-10-CM

## 2015-03-30 HISTORY — DX: Unspecified abdominal pain: R10.9

## 2015-03-30 LAB — HEPATIC FUNCTION PANEL
ALK PHOS: 40 U/L (ref 39–117)
ALT: 10 U/L (ref 0–35)
AST: 17 U/L (ref 0–37)
Albumin: 4.2 g/dL (ref 3.5–5.2)
BILIRUBIN DIRECT: 0.1 mg/dL (ref 0.0–0.3)
BILIRUBIN TOTAL: 0.4 mg/dL (ref 0.2–1.2)
Total Protein: 7.7 g/dL (ref 6.0–8.3)

## 2015-03-30 LAB — BASIC METABOLIC PANEL
BUN: 18 mg/dL (ref 6–23)
CALCIUM: 9.4 mg/dL (ref 8.4–10.5)
CO2: 32 mEq/L (ref 19–32)
CREATININE: 0.84 mg/dL (ref 0.40–1.20)
Chloride: 104 mEq/L (ref 96–112)
GFR: 84.51 mL/min (ref >60.00)
Glucose, Bld: 82 mg/dL (ref 70–99)
Potassium: 3.7 mEq/L (ref 3.5–5.1)
Sodium: 139 mEq/L (ref 135–145)

## 2015-03-30 LAB — SEDIMENTATION RATE: Sed Rate: 25 mm/hr — ABNORMAL HIGH (ref 0–22)

## 2015-03-30 LAB — TSH: TSH: 3.12 u[IU]/mL (ref 0.35–4.50)

## 2015-03-30 NOTE — Assessment & Plan Note (Signed)
On Vit D 

## 2015-03-30 NOTE — Assessment & Plan Note (Signed)

## 2015-03-30 NOTE — Patient Instructions (Signed)
Preventive Care for Adults A healthy lifestyle and preventive care can promote health and wellness. Preventive health guidelines for women include the following key practices.  A routine yearly physical is a good way to check with your health care provider about your health and preventive screening. It is a chance to share any concerns and updates on your health and to receive a thorough exam.  Visit your dentist for a routine exam and preventive care every 6 months. Brush your teeth twice a day and floss once a day. Good oral hygiene prevents tooth decay and gum disease.  The frequency of eye exams is based on your age, health, family medical history, use of contact lenses, and other factors. Follow your health care provider's recommendations for frequency of eye exams.  Eat a healthy diet. Foods like vegetables, fruits, whole grains, low-fat dairy products, and lean protein foods contain the nutrients you need without too many calories. Decrease your intake of foods high in solid fats, added sugars, and salt. Eat the right amount of calories for you.Get information about a proper diet from your health care provider, if necessary.  Regular physical exercise is one of the most important things you can do for your health. Most adults should get at least 150 minutes of moderate-intensity exercise (any activity that increases your heart rate and causes you to sweat) each week. In addition, most adults need muscle-strengthening exercises on 2 or more days a week.  Maintain a healthy weight. The body mass index (BMI) is a screening tool to identify possible weight problems. It provides an estimate of body fat based on height and weight. Your health care provider can find your BMI and can help you achieve or maintain a healthy weight.For adults 20 years and older:  A BMI below 18.5 is considered underweight.  A BMI of 18.5 to 24.9 is normal.  A BMI of 25 to 29.9 is considered overweight.  A BMI of  30 and above is considered obese.  Maintain normal blood lipids and cholesterol levels by exercising and minimizing your intake of saturated fat. Eat a balanced diet with plenty of fruit and vegetables. Blood tests for lipids and cholesterol should begin at age 76 and be repeated every 5 years. If your lipid or cholesterol levels are high, you are over 50, or you are at high risk for heart disease, you may need your cholesterol levels checked more frequently.Ongoing high lipid and cholesterol levels should be treated with medicines if diet and exercise are not working.  If you smoke, find out from your health care provider how to quit. If you do not use tobacco, do not start.  Lung cancer screening is recommended for adults aged 22-80 years who are at high risk for developing lung cancer because of a history of smoking. A yearly low-dose CT scan of the lungs is recommended for people who have at least a 30-pack-year history of smoking and are a current smoker or have quit within the past 15 years. A pack year of smoking is smoking an average of 1 pack of cigarettes a day for 1 year (for example: 1 pack a day for 30 years or 2 packs a day for 15 years). Yearly screening should continue until the smoker has stopped smoking for at least 15 years. Yearly screening should be stopped for people who develop a health problem that would prevent them from having lung cancer treatment.  If you are pregnant, do not drink alcohol. If you are breastfeeding,  be very cautious about drinking alcohol. If you are not pregnant and choose to drink alcohol, do not have more than 1 drink per day. One drink is considered to be 12 ounces (355 mL) of beer, 5 ounces (148 mL) of wine, or 1.5 ounces (44 mL) of liquor.  Avoid use of street drugs. Do not share needles with anyone. Ask for help if you need support or instructions about stopping the use of drugs.  High blood pressure causes heart disease and increases the risk of  stroke. Your blood pressure should be checked at least every 1 to 2 years. Ongoing high blood pressure should be treated with medicines if weight loss and exercise do not work.  If you are 3-86 years old, ask your health care provider if you should take aspirin to prevent strokes.  Diabetes screening involves taking a blood sample to check your fasting blood sugar level. This should be done once every 3 years, after age 67, if you are within normal weight and without risk factors for diabetes. Testing should be considered at a younger age or be carried out more frequently if you are overweight and have at least 1 risk factor for diabetes.  Breast cancer screening is essential preventive care for women. You should practice "breast self-awareness." This means understanding the normal appearance and feel of your breasts and may include breast self-examination. Any changes detected, no matter how small, should be reported to a health care provider. Women in their 8s and 30s should have a clinical breast exam (CBE) by a health care provider as part of a regular health exam every 1 to 3 years. After age 70, women should have a CBE every year. Starting at age 25, women should consider having a mammogram (breast X-ray test) every year. Women who have a family history of breast cancer should talk to their health care provider about genetic screening. Women at a high risk of breast cancer should talk to their health care providers about having an MRI and a mammogram every year.  Breast cancer gene (BRCA)-related cancer risk assessment is recommended for women who have family members with BRCA-related cancers. BRCA-related cancers include breast, ovarian, tubal, and peritoneal cancers. Having family members with these cancers may be associated with an increased risk for harmful changes (mutations) in the breast cancer genes BRCA1 and BRCA2. Results of the assessment will determine the need for genetic counseling and  BRCA1 and BRCA2 testing.  Routine pelvic exams to screen for cancer are no longer recommended for nonpregnant women who are considered low risk for cancer of the pelvic organs (ovaries, uterus, and vagina) and who do not have symptoms. Ask your health care provider if a screening pelvic exam is right for you.  If you have had past treatment for cervical cancer or a condition that could lead to cancer, you need Pap tests and screening for cancer for at least 20 years after your treatment. If Pap tests have been discontinued, your risk factors (such as having a new sexual partner) need to be reassessed to determine if screening should be resumed. Some women have medical problems that increase the chance of getting cervical cancer. In these cases, your health care provider may recommend more frequent screening and Pap tests.  The HPV test is an additional test that may be used for cervical cancer screening. The HPV test looks for the virus that can cause the cell changes on the cervix. The cells collected during the Pap test can be  tested for HPV. The HPV test could be used to screen women aged 30 years and older, and should be used in women of any age who have unclear Pap test results. After the age of 30, women should have HPV testing at the same frequency as a Pap test.  Colorectal cancer can be detected and often prevented. Most routine colorectal cancer screening begins at the age of 50 years and continues through age 75 years. However, your health care provider may recommend screening at an earlier age if you have risk factors for colon cancer. On a yearly basis, your health care provider may provide home test kits to check for hidden blood in the stool. Use of a small camera at the end of a tube, to directly examine the colon (sigmoidoscopy or colonoscopy), can detect the earliest forms of colorectal cancer. Talk to your health care provider about this at age 50, when routine screening begins. Direct  exam of the colon should be repeated every 5-10 years through age 75 years, unless early forms of pre-cancerous polyps or small growths are found.  People who are at an increased risk for hepatitis B should be screened for this virus. You are considered at high risk for hepatitis B if:  You were born in a country where hepatitis B occurs often. Talk with your health care provider about which countries are considered high risk.  Your parents were born in a high-risk country and you have not received a shot to protect against hepatitis B (hepatitis B vaccine).  You have HIV or AIDS.  You use needles to inject street drugs.  You live with, or have sex with, someone who has hepatitis B.  You get hemodialysis treatment.  You take certain medicines for conditions like cancer, organ transplantation, and autoimmune conditions.  Hepatitis C blood testing is recommended for all people born from 1945 through 1965 and any individual with known risks for hepatitis C.  Practice safe sex. Use condoms and avoid high-risk sexual practices to reduce the spread of sexually transmitted infections (STIs). STIs include gonorrhea, chlamydia, syphilis, trichomonas, herpes, HPV, and human immunodeficiency virus (HIV). Herpes, HIV, and HPV are viral illnesses that have no cure. They can result in disability, cancer, and death.  You should be screened for sexually transmitted illnesses (STIs) including gonorrhea and chlamydia if:  You are sexually active and are younger than 24 years.  You are older than 24 years and your health care provider tells you that you are at risk for this type of infection.  Your sexual activity has changed since you were last screened and you are at an increased risk for chlamydia or gonorrhea. Ask your health care provider if you are at risk.  If you are at risk of being infected with HIV, it is recommended that you take a prescription medicine daily to prevent HIV infection. This is  called preexposure prophylaxis (PrEP). You are considered at risk if:  You are a heterosexual woman, are sexually active, and are at increased risk for HIV infection.  You take drugs by injection.  You are sexually active with a partner who has HIV.  Talk with your health care provider about whether you are at high risk of being infected with HIV. If you choose to begin PrEP, you should first be tested for HIV. You should then be tested every 3 months for as long as you are taking PrEP.  Osteoporosis is a disease in which the bones lose minerals and strength   with aging. This can result in serious bone fractures or breaks. The risk of osteoporosis can be identified using a bone density scan. Women ages 65 years and over and women at risk for fractures or osteoporosis should discuss screening with their health care providers. Ask your health care provider whether you should take a calcium supplement or vitamin D to reduce the rate of osteoporosis.  Menopause can be associated with physical symptoms and risks. Hormone replacement therapy is available to decrease symptoms and risks. You should talk to your health care provider about whether hormone replacement therapy is right for you.  Use sunscreen. Apply sunscreen liberally and repeatedly throughout the day. You should seek shade when your shadow is shorter than you. Protect yourself by wearing long sleeves, pants, a wide-brimmed hat, and sunglasses year round, whenever you are outdoors.  Once a month, do a whole body skin exam, using a mirror to look at the skin on your back. Tell your health care provider of new moles, moles that have irregular borders, moles that are larger than a pencil eraser, or moles that have changed in shape or color.  Stay current with required vaccines (immunizations).  Influenza vaccine. All adults should be immunized every year.  Tetanus, diphtheria, and acellular pertussis (Td, Tdap) vaccine. Pregnant women should  receive 1 dose of Tdap vaccine during each pregnancy. The dose should be obtained regardless of the length of time since the last dose. Immunization is preferred during the 27th-36th week of gestation. An adult who has not previously received Tdap or who does not know her vaccine status should receive 1 dose of Tdap. This initial dose should be followed by tetanus and diphtheria toxoids (Td) booster doses every 10 years. Adults with an unknown or incomplete history of completing a 3-dose immunization series with Td-containing vaccines should begin or complete a primary immunization series including a Tdap dose. Adults should receive a Td booster every 10 years.  Varicella vaccine. An adult without evidence of immunity to varicella should receive 2 doses or a second dose if she has previously received 1 dose. Pregnant females who do not have evidence of immunity should receive the first dose after pregnancy. This first dose should be obtained before leaving the health care facility. The second dose should be obtained 4-8 weeks after the first dose.  Human papillomavirus (HPV) vaccine. Females aged 13-26 years who have not received the vaccine previously should obtain the 3-dose series. The vaccine is not recommended for use in pregnant females. However, pregnancy testing is not needed before receiving a dose. If a female is found to be pregnant after receiving a dose, no treatment is needed. In that case, the remaining doses should be delayed until after the pregnancy. Immunization is recommended for any person with an immunocompromised condition through the age of 26 years if she did not get any or all doses earlier. During the 3-dose series, the second dose should be obtained 4-8 weeks after the first dose. The third dose should be obtained 24 weeks after the first dose and 16 weeks after the second dose.  Zoster vaccine. One dose is recommended for adults aged 60 years or older unless certain conditions are  present.  Measles, mumps, and rubella (MMR) vaccine. Adults born before 1957 generally are considered immune to measles and mumps. Adults born in 1957 or later should have 1 or more doses of MMR vaccine unless there is a contraindication to the vaccine or there is laboratory evidence of immunity to   each of the three diseases. A routine second dose of MMR vaccine should be obtained at least 28 days after the first dose for students attending postsecondary schools, health care workers, or international travelers. People who received inactivated measles vaccine or an unknown type of measles vaccine during 1963-1967 should receive 2 doses of MMR vaccine. People who received inactivated mumps vaccine or an unknown type of mumps vaccine before 1979 and are at high risk for mumps infection should consider immunization with 2 doses of MMR vaccine. For females of childbearing age, rubella immunity should be determined. If there is no evidence of immunity, females who are not pregnant should be vaccinated. If there is no evidence of immunity, females who are pregnant should delay immunization until after pregnancy. Unvaccinated health care workers born before 1957 who lack laboratory evidence of measles, mumps, or rubella immunity or laboratory confirmation of disease should consider measles and mumps immunization with 2 doses of MMR vaccine or rubella immunization with 1 dose of MMR vaccine.  Pneumococcal 13-valent conjugate (PCV13) vaccine. When indicated, a person who is uncertain of her immunization history and has no record of immunization should receive the PCV13 vaccine. An adult aged 19 years or older who has certain medical conditions and has not been previously immunized should receive 1 dose of PCV13 vaccine. This PCV13 should be followed with a dose of pneumococcal polysaccharide (PPSV23) vaccine. The PPSV23 vaccine dose should be obtained at least 8 weeks after the dose of PCV13 vaccine. An adult aged 19  years or older who has certain medical conditions and previously received 1 or more doses of PPSV23 vaccine should receive 1 dose of PCV13. The PCV13 vaccine dose should be obtained 1 or more years after the last PPSV23 vaccine dose.  Pneumococcal polysaccharide (PPSV23) vaccine. When PCV13 is also indicated, PCV13 should be obtained first. All adults aged 65 years and older should be immunized. An adult younger than age 65 years who has certain medical conditions should be immunized. Any person who resides in a nursing home or long-term care facility should be immunized. An adult smoker should be immunized. People with an immunocompromised condition and certain other conditions should receive both PCV13 and PPSV23 vaccines. People with human immunodeficiency virus (HIV) infection should be immunized as soon as possible after diagnosis. Immunization during chemotherapy or radiation therapy should be avoided. Routine use of PPSV23 vaccine is not recommended for American Indians, [place] Natives, or people younger than 65 years unless there are medical conditions that require PPSV23 vaccine. When indicated, people who have unknown immunization and have no record of immunization should receive PPSV23 vaccine. One-time revaccination 5 years after the first dose of PPSV23 is recommended for people aged 19-64 years who have chronic kidney failure, nephrotic syndrome, asplenia, or immunocompromised conditions. People who received 1-2 doses of PPSV23 before age 65 years should receive another dose of PPSV23 vaccine at age 65 years or later if at least 5 years have passed since the previous dose. Doses of PPSV23 are not needed for people immunized with PPSV23 at or after age 65 years.  Meningococcal vaccine. Adults with asplenia or persistent complement component deficiencies should receive 2 doses of quadrivalent meningococcal conjugate (MenACWY-D) vaccine. The doses should be obtained at least 2 months apart.  Microbiologists working with certain meningococcal bacteria, military recruits, people at risk during an outbreak, and people who travel to or live in countries with a high rate of meningitis should be immunized. A first-year college student up through age   21 years who is living in a residence hall should receive a dose if she did not receive a dose on or after her 16th birthday. Adults who have certain high-risk conditions should receive one or more doses of vaccine.  Hepatitis A vaccine. Adults who wish to be protected from this disease, have certain high-risk conditions, work with hepatitis A-infected animals, work in hepatitis A research labs, or travel to or work in countries with a high rate of hepatitis A should be immunized. Adults who were previously unvaccinated and who anticipate close contact with an international adoptee during the first 60 days after arrival in the [place] from a country with a high rate of hepatitis A should be immunized.  Hepatitis B vaccine. Adults who wish to be protected from this disease, have certain high-risk conditions, may be exposed to blood or other infectious body fluids, are household contacts or sex partners of hepatitis B positive people, are clients or workers in certain care facilities, or travel to or work in countries with a high rate of hepatitis B should be immunized.  Haemophilus influenzae type b (Hib) vaccine. A previously unvaccinated person with asplenia or sickle cell disease or having a scheduled splenectomy should receive 1 dose of Hib vaccine. Regardless of previous immunization, a recipient of a hematopoietic stem cell transplant should receive a 3-dose series 6-12 months after her successful transplant. Hib vaccine is not recommended for adults with HIV infection. Preventive Services / Frequency Ages 64 to 68 years  Blood pressure check.** / Every 1 to 2 years.  Lipid and cholesterol check.** / Every 5 years beginning at age  22.  Clinical breast exam.** / Every 3 years for women in their 88s and 53s.  BRCA-related cancer risk assessment.** / For women who have family members with a BRCA-related cancer (breast, ovarian, tubal, or peritoneal cancers).  Pap test.** / Every 2 years from ages 90 through 51. Every 3 years starting at age 21 through age 56 or 3 with a history of 3 consecutive normal Pap tests.  HPV screening.** / Every 3 years from ages 24 through ages 1 to 46 with a history of 3 consecutive normal Pap tests.  Hepatitis C blood test.** / For any individual with known risks for hepatitis C.  Skin self-exam. / Monthly.  Influenza vaccine. / Every year.  Tetanus, diphtheria, and acellular pertussis (Tdap, Td) vaccine.** / Consult your health care provider. Pregnant women should receive 1 dose of Tdap vaccine during each pregnancy. 1 dose of Td every 10 years.  Varicella vaccine.** / Consult your health care provider. Pregnant females who do not have evidence of immunity should receive the first dose after pregnancy.  HPV vaccine. / 3 doses over 6 months, if 72 and younger. The vaccine is not recommended for use in pregnant females. However, pregnancy testing is not needed before receiving a dose.  Measles, mumps, rubella (MMR) vaccine.** / You need at least 1 dose of MMR if you were born in 1957 or later. You may also need a 2nd dose. For females of childbearing age, rubella immunity should be determined. If there is no evidence of immunity, females who are not pregnant should be vaccinated. If there is no evidence of immunity, females who are pregnant should delay immunization until after pregnancy.  Pneumococcal 13-valent conjugate (PCV13) vaccine.** / Consult your health care provider.  Pneumococcal polysaccharide (PPSV23) vaccine.** / 1 to 2 doses if you smoke cigarettes or if you have certain conditions.  Meningococcal vaccine.** /  1 dose if you are age 19 to 21 years and a first-year college  student living in a residence hall, or have one of several medical conditions, you need to get vaccinated against meningococcal disease. You may also need additional booster doses.  Hepatitis A vaccine.** / Consult your health care provider.  Hepatitis B vaccine.** / Consult your health care provider.  Haemophilus influenzae type b (Hib) vaccine.** / Consult your health care provider. Ages 40 to 64 years  Blood pressure check.** / Every 1 to 2 years.  Lipid and cholesterol check.** / Every 5 years beginning at age 20 years.  Lung cancer screening. / Every year if you are aged 55-80 years and have a 30-pack-year history of smoking and currently smoke or have quit within the past 15 years. Yearly screening is stopped once you have quit smoking for at least 15 years or develop a health problem that would prevent you from having lung cancer treatment.  Clinical breast exam.** / Every year after age 40 years.  BRCA-related cancer risk assessment.** / For women who have family members with a BRCA-related cancer (breast, ovarian, tubal, or peritoneal cancers).  Mammogram.** / Every year beginning at age 40 years and continuing for as long as you are in good health. Consult with your health care provider.  Pap test.** / Every 3 years starting at age 30 years through age 65 or 70 years with a history of 3 consecutive normal Pap tests.  HPV screening.** / Every 3 years from ages 30 years through ages 65 to 70 years with a history of 3 consecutive normal Pap tests.  Fecal occult blood test (FOBT) of stool. / Every year beginning at age 50 years and continuing until age 75 years. You may not need to do this test if you get a colonoscopy every 10 years.  Flexible sigmoidoscopy or colonoscopy.** / Every 5 years for a flexible sigmoidoscopy or every 10 years for a colonoscopy beginning at age 50 years and continuing until age 75 years.  Hepatitis C blood test.** / For all people born from 1945 through  1965 and any individual with known risks for hepatitis C.  Skin self-exam. / Monthly.  Influenza vaccine. / Every year.  Tetanus, diphtheria, and acellular pertussis (Tdap/Td) vaccine.** / Consult your health care provider. Pregnant women should receive 1 dose of Tdap vaccine during each pregnancy. 1 dose of Td every 10 years.  Varicella vaccine.** / Consult your health care provider. Pregnant females who do not have evidence of immunity should receive the first dose after pregnancy.  Zoster vaccine.** / 1 dose for adults aged 60 years or older.  Measles, mumps, rubella (MMR) vaccine.** / You need at least 1 dose of MMR if you were born in 1957 or later. You may also need a 2nd dose. For females of childbearing age, rubella immunity should be determined. If there is no evidence of immunity, females who are not pregnant should be vaccinated. If there is no evidence of immunity, females who are pregnant should delay immunization until after pregnancy.  Pneumococcal 13-valent conjugate (PCV13) vaccine.** / Consult your health care provider.  Pneumococcal polysaccharide (PPSV23) vaccine.** / 1 to 2 doses if you smoke cigarettes or if you have certain conditions.  Meningococcal vaccine.** / Consult your health care provider.  Hepatitis A vaccine.** / Consult your health care provider.  Hepatitis B vaccine.** / Consult your health care provider.  Haemophilus influenzae type b (Hib) vaccine.** / Consult your health care provider. Ages 65   years and over  Blood pressure check.** / Every 1 to 2 years.  Lipid and cholesterol check.** / Every 5 years beginning at age 22 years.  Lung cancer screening. / Every year if you are aged 73-80 years and have a 30-pack-year history of smoking and currently smoke or have quit within the past 15 years. Yearly screening is stopped once you have quit smoking for at least 15 years or develop a health problem that would prevent you from having lung cancer  treatment.  Clinical breast exam.** / Every year after age 4 years.  BRCA-related cancer risk assessment.** / For women who have family members with a BRCA-related cancer (breast, ovarian, tubal, or peritoneal cancers).  Mammogram.** / Every year beginning at age 40 years and continuing for as long as you are in good health. Consult with your health care provider.  Pap test.** / Every 3 years starting at age 9 years through age 34 or 91 years with 3 consecutive normal Pap tests. Testing can be stopped between 65 and 70 years with 3 consecutive normal Pap tests and no abnormal Pap or HPV tests in the past 10 years.  HPV screening.** / Every 3 years from ages 57 years through ages 64 or 45 years with a history of 3 consecutive normal Pap tests. Testing can be stopped between 65 and 70 years with 3 consecutive normal Pap tests and no abnormal Pap or HPV tests in the past 10 years.  Fecal occult blood test (FOBT) of stool. / Every year beginning at age 15 years and continuing until age 17 years. You may not need to do this test if you get a colonoscopy every 10 years.  Flexible sigmoidoscopy or colonoscopy.** / Every 5 years for a flexible sigmoidoscopy or every 10 years for a colonoscopy beginning at age 86 years and continuing until age 71 years.  Hepatitis C blood test.** / For all people born from [birth year] through 1963 and any individual with known risks for hepatitis C.  Osteoporosis screening.** / A one-time screening for women ages 83 years and over and women at risk for fractures or osteoporosis.  Skin self-exam. / Monthly.  Influenza vaccine. / Every year.  Tetanus, diphtheria, and acellular pertussis (Tdap/Td) vaccine.** / 1 dose of Td every 10 years.  Varicella vaccine.** / Consult your health care provider.  Zoster vaccine.** / 1 dose for adults aged 61 years or older.  Pneumococcal 13-valent conjugate (PCV13) vaccine.** / Consult your health care provider.  Pneumococcal  polysaccharide (PPSV23) vaccine.** / 1 dose for all adults aged 28 years and older.  Meningococcal vaccine.** / Consult your health care provider.  Hepatitis A vaccine.** / Consult your health care provider.  Hepatitis B vaccine.** / Consult your health care provider.  Haemophilus influenzae type b (Hib) vaccine.** / Consult your health care provider. ** Family history and personal history of risk and conditions may change your health care provider's recommendations. Document Released: 08/21/2001 Document Revised: 11/09/2013 Document Reviewed: 11/20/2010 [facility] Patient Information 2015 [place], [place]. This information is not intended to replace advice given to you by your health care provider. Make sure you discuss any questions you have with your health care provider.

## 2015-03-30 NOTE — Assessment & Plan Note (Signed)
Resolved

## 2015-03-30 NOTE — Progress Notes (Signed)
Subjective:  Patient ID: Cindy Meza, female    DOB: Mar 17, 1936  Age: 79 y.o. MRN: [id number]  CC: No chief complaint on file.   HPI Cindy Meza presents for a well exam  Pt saw Dr Linna Darner and Dr Valentino Saxon and had labs and pelvic. Pain has moved up (6 -8 weeks). On Fosamax  Outpatient Prescriptions Prior to Visit  Medication Sig Dispense Refill  . albuterol (VENTOLIN HFA) 108 (90 BASE) MCG/ACT inhaler Inhale 1-2 puffs into the lungs every 6 (six) hours as needed. 1 Inhaler 11  . alendronate (FOSAMAX) 70 MG tablet Take 70 mg by mouth once a week.   12  . Calcium-Magnesium-Vitamin D (CALCIUM 500 PO) Take 1 each by mouth daily.    . Cholecalciferol 1000 UNITS tablet Take 1,000 Units by mouth daily.      . fluticasone-salmeterol (ADVAIR HFA) 115-21 MCG/ACT inhaler Inhale 2 puffs into the lungs 2 (two) times daily. 1 Inhaler 11  . loratadine (CLARITIN) 10 MG tablet Take 10 mg by mouth daily as needed.      . ranitidine (ZANTAC) 150 MG tablet TAKE ONE TABLET BY MOUTH TWICE DAILY 60 tablet 11  . vitamin E (VITAMIN E) 400 UNIT capsule Take 1 capsule (400 Units total) by mouth daily. 100 capsule 1   No facility-administered medications prior to visit.    ROS Review of Systems  Constitutional: Negative for chills, activity change, appetite change, fatigue and unexpected weight change.  HENT: Negative for congestion, mouth sores and sinus pressure.   Eyes: Negative for visual disturbance.  Respiratory: Negative for cough and chest tightness.   Gastrointestinal: Positive for abdominal pain and abdominal distention. Negative for nausea.  Genitourinary: Negative for frequency, difficulty urinating and vaginal pain.  Musculoskeletal: Negative for back pain and gait problem.  Skin: Negative for pallor and rash.  Neurological: Negative for dizziness, tremors, weakness, numbness and headaches.  Psychiatric/Behavioral: Negative for suicidal ideas, confusion and sleep disturbance. The patient  is not nervous/anxious.     Objective:  BP 132/84 mmHg  Pulse 82  Ht 5\' 3"  (1.6 m)  Wt 116 lb (52.617 kg)  BMI 20.55 kg/m2  SpO2 99%  BP Readings from Last 3 Encounters:  03/30/15 132/84  02/10/15 142/86  01/05/15 140/90    Wt Readings from Last 3 Encounters:  03/30/15 116 lb (52.617 kg)  02/10/15 116 lb (52.617 kg)  01/05/15 116 lb (52.617 kg)    Physical Exam  Constitutional: She appears well-developed. No distress.  HENT:  Head: Normocephalic.  Right Ear: External ear normal.  Left Ear: External ear normal.  Nose: Nose normal.  Mouth/Throat: Oropharynx is clear and moist.  Eyes: Conjunctivae are normal. Pupils are equal, round, and reactive to light. Right eye exhibits no discharge. Left eye exhibits no discharge.  Neck: Normal range of motion. Neck supple. No JVD present. No tracheal deviation present. No thyromegaly present.  Cardiovascular: Normal rate, regular rhythm and normal heart sounds.   Pulmonary/Chest: No stridor. No respiratory distress. She has no wheezes.  Abdominal: Soft. Bowel sounds are normal. She exhibits no distension and no mass. There is no tenderness. There is no rebound and no guarding.  Musculoskeletal: She exhibits no edema or tenderness.  Lymphadenopathy:    She has no cervical adenopathy.  Neurological: She displays normal reflexes. No cranial nerve deficit. She exhibits normal muscle tone. Coordination normal.  Skin: No rash noted. No erythema.  Psychiatric: She has a normal mood and affect. Her behavior is normal. Judgment  and thought content normal.    Lab Results  Component Value Date   WBC 7.3 02/10/2015   HGB 13.1 02/10/2015   HCT 38.9 02/10/2015   PLT 288.0 02/10/2015   GLUCOSE 82 03/30/2015   CHOL 256* 03/23/2014   TRIG 54.0 03/23/2014   HDL 129.60 03/23/2014   LDLDIRECT 104.0 03/17/2013   LDLCALC 116* 03/23/2014   ALT 10 03/30/2015   AST 17 03/30/2015   NA 139 03/30/2015   K 3.7 03/30/2015   CL 104 03/30/2015    CREATININE 0.84 03/30/2015   BUN 18 03/30/2015   CO2 32 03/30/2015   TSH 3.12 03/30/2015   HGBA1C 5.2 11/05/2014    Dg Thoracic Spine W/swimmers  07/07/2014   CLINICAL DATA:  Cervical thoracic radiculopathy. Pain. Left arm numbness and tingling for 10 days.  EXAM: THORACIC SPINE - 2 VIEW + SWIMMERS  COMPARISON:  Chest radiographs 05/15/2010  FINDINGS: Again seen is slight right convex curvature of the mid thoracic spine. There is no listhesis. Vertebral body heights are preserved without evidence of compression fracture. Mild endplate spurring is present in the lower thoracic spine and visualized lumbar spine. Intervertebral disc space heights appear relatively well preserved. The overall appearance is similar to that on prior chest radiographs. Thoracic aortic calcification is noted. Visualized portions of the lungs are grossly clear.  IMPRESSION: Mild thoracic spondylosis and mild thoracic dextroscoliosis. No acute abnormality identified.   Electronically Signed   By: Logan Bores   On: 07/07/2014 16:28    Assessment & Plan:   Diagnoses and all orders for this visit:  Well adult exam -     TSH; Future -     Basic metabolic panel; Future -     Hepatic function panel; Future -     Sedimentation rate; Future  Need for influenza vaccination -     Flu Vaccine QUAD 36+ mos IM -     TSH; Future -     Basic metabolic panel; Future -     Hepatic function panel; Future -     Sedimentation rate; Future  Paresthesia of arm -     TSH; Future -     Basic metabolic panel; Future -     Hepatic function panel; Future -     Sedimentation rate; Future  Vitamin D deficiency -     TSH; Future -     Basic metabolic panel; Future -     Hepatic function panel; Future -     Sedimentation rate; Future  Osteoporosis -     TSH; Future -     Basic metabolic panel; Future -     Hepatic function panel; Future -     Sedimentation rate; Future  Lower abdominal pain -     TSH; Future -     Basic  metabolic panel; Future -     Hepatic function panel; Future -     Sedimentation rate; Future   I am having Cindy Meza maintain her loratadine, Cholecalciferol, Calcium-Magnesium-Vitamin D (CALCIUM 500 PO), albuterol, ranitidine, vitamin E, alendronate, and fluticasone-salmeterol.  No orders of the defined types were placed in this encounter.     Follow-up: Return in about 3 months (around 06/29/2015) for a follow-up visit.  [name], MD

## 2015-03-30 NOTE — Assessment & Plan Note (Signed)
On Fosamax 

## 2015-03-30 NOTE — Progress Notes (Signed)
Pre visit review using our clinic review tool, if applicable. No additional management support is needed unless otherwise documented below in the visit note. 

## 2015-03-30 NOTE — Assessment & Plan Note (Signed)
8/16 lower ?fosamax related S/pgyn eval Hold Fosamax x 2 mo CT abd if not better

## 2015-04-15 ENCOUNTER — Encounter: Payer: Self-pay | Admitting: Internal Medicine

## 2015-04-15 ENCOUNTER — Ambulatory Visit (INDEPENDENT_AMBULATORY_CARE_PROVIDER_SITE_OTHER): Payer: Medicare Other | Admitting: Internal Medicine

## 2015-04-15 VITALS — BP 130/82 | HR 61 | Temp 97.9°F | Wt 115.0 lb

## 2015-04-15 DIAGNOSIS — R1030 Lower abdominal pain, unspecified: Secondary | ICD-10-CM

## 2015-04-15 DIAGNOSIS — M810 Age-related osteoporosis without current pathological fracture: Secondary | ICD-10-CM

## 2015-04-15 NOTE — Assessment & Plan Note (Signed)
Not better CT abd

## 2015-04-15 NOTE — Progress Notes (Signed)
Pre visit review using our clinic review tool, if applicable. No additional management support is needed unless otherwise documented below in the visit note. 

## 2015-04-15 NOTE — Progress Notes (Signed)
Subjective:  Patient ID: Cindy Meza, female    DOB: 1936/05/28  Age: 79 y.o. MRN: [id number]  CC: Bloated and Abdominal Pain   HPI Cindy Meza presents for lower abd pain and bloating, cramps - not better.  Outpatient Prescriptions Prior to Visit  Medication Sig Dispense Refill  . albuterol (VENTOLIN HFA) 108 (90 BASE) MCG/ACT inhaler Inhale 1-2 puffs into the lungs every 6 (six) hours as needed. 1 Inhaler 11  . Calcium-Magnesium-Vitamin D (CALCIUM 500 PO) Take 1 each by mouth daily.    . Cholecalciferol 1000 UNITS tablet Take 1,000 Units by mouth daily.      . fluticasone-salmeterol (ADVAIR HFA) 115-21 MCG/ACT inhaler Inhale 2 puffs into the lungs 2 (two) times daily. 1 Inhaler 11  . loratadine (CLARITIN) 10 MG tablet Take 10 mg by mouth daily as needed.      . ranitidine (ZANTAC) 150 MG tablet TAKE ONE TABLET BY MOUTH TWICE DAILY 60 tablet 11  . vitamin E (VITAMIN E) 400 UNIT capsule Take 1 capsule (400 Units total) by mouth daily. 100 capsule 1  . alendronate (FOSAMAX) 70 MG tablet Take 70 mg by mouth once a week.   12   No facility-administered medications prior to visit.    ROS Review of Systems  Objective:  BP 130/82 mmHg  Pulse 61  Temp(Src) 97.9 F (36.6 C) (Oral)  Wt 115 lb (52.164 kg)  SpO2 98%  BP Readings from Last 3 Encounters:  04/15/15 130/82  03/30/15 132/84  02/10/15 142/86    Wt Readings from Last 3 Encounters:  04/15/15 115 lb (52.164 kg)  03/30/15 116 lb (52.617 kg)  02/10/15 116 lb (52.617 kg)    Physical Exam  Lab Results  Component Value Date   WBC 7.3 02/10/2015   HGB 13.1 02/10/2015   HCT 38.9 02/10/2015   PLT 288.0 02/10/2015   GLUCOSE 82 03/30/2015   CHOL 256* 03/23/2014   TRIG 54.0 03/23/2014   HDL 129.60 03/23/2014   LDLDIRECT 104.0 03/17/2013   LDLCALC 116* 03/23/2014   ALT 10 03/30/2015   AST 17 03/30/2015   NA 139 03/30/2015   K 3.7 03/30/2015   CL 104 03/30/2015   CREATININE 0.84 03/30/2015   BUN 18  03/30/2015   CO2 32 03/30/2015   TSH 3.12 03/30/2015   HGBA1C 5.2 11/05/2014    Dg Thoracic Spine W/swimmers  07/07/2014   CLINICAL DATA:  Cervical thoracic radiculopathy. Pain. Left arm numbness and tingling for 10 days.  EXAM: THORACIC SPINE - 2 VIEW + SWIMMERS  COMPARISON:  Chest radiographs 05/15/2010  FINDINGS: Again seen is slight right convex curvature of the mid thoracic spine. There is no listhesis. Vertebral body heights are preserved without evidence of compression fracture. Mild endplate spurring is present in the lower thoracic spine and visualized lumbar spine. Intervertebral disc space heights appear relatively well preserved. The overall appearance is similar to that on prior chest radiographs. Thoracic aortic calcification is noted. Visualized portions of the lungs are grossly clear.  IMPRESSION: Mild thoracic spondylosis and mild thoracic dextroscoliosis. No acute abnormality identified.   Electronically Signed   By: Logan Bores   On: 07/07/2014 16:28    Assessment & Plan:   Guadalupe was seen today for bloated and abdominal pain.  Diagnoses and all orders for this visit:  Lower abdominal pain -     CT Abdomen Pelvis W Contrast  I have discontinued Ms. Trevizo's alendronate. I am also having her maintain her loratadine, Cholecalciferol,  Calcium-Magnesium-Vitamin D (CALCIUM 500 PO), albuterol, ranitidine, vitamin E, and fluticasone-salmeterol.  No orders of the defined types were placed in this encounter.     Follow-up: Return in about 4 weeks (around 05/13/2015) for a follow-up visit.  [name], MD

## 2015-04-26 NOTE — Assessment & Plan Note (Signed)
Chronic  On Vit D Core strengthening exercises 

## 2015-04-28 ENCOUNTER — Ambulatory Visit (INDEPENDENT_AMBULATORY_CARE_PROVIDER_SITE_OTHER)
Admission: RE | Admit: 2015-04-28 | Discharge: 2015-04-28 | Disposition: A | Discharge status: Home / Self Care | Payer: Medicare Other | Source: Ambulatory Visit | Attending: Internal Medicine | Admitting: Internal Medicine

## 2015-04-28 DIAGNOSIS — R1030 Lower abdominal pain, unspecified: Secondary | ICD-10-CM | POA: Diagnosis not present

## 2015-04-28 MED ORDER — IOHEXOL 300 MG/ML  SOLN
100.0000 mL | Freq: Once | INTRAMUSCULAR | Status: AC | PRN
  Administered 2015-04-28: 100 mL via INTRAVENOUS

## 2015-05-17 ENCOUNTER — Other Ambulatory Visit (INDEPENDENT_AMBULATORY_CARE_PROVIDER_SITE_OTHER): Payer: Medicare Other

## 2015-05-17 ENCOUNTER — Ambulatory Visit (INDEPENDENT_AMBULATORY_CARE_PROVIDER_SITE_OTHER): Payer: Medicare Other | Admitting: Internal Medicine

## 2015-05-17 ENCOUNTER — Encounter: Payer: Self-pay | Admitting: Internal Medicine

## 2015-05-17 VITALS — BP 150/80 | HR 87 | Wt 115.0 lb

## 2015-05-17 DIAGNOSIS — R1030 Lower abdominal pain, unspecified: Secondary | ICD-10-CM

## 2015-05-17 DIAGNOSIS — R3915 Urgency of urination: Secondary | ICD-10-CM | POA: Diagnosis not present

## 2015-05-17 LAB — URINALYSIS
BILIRUBIN URINE: NEGATIVE
HGB URINE DIPSTICK: NEGATIVE
KETONES UR: NEGATIVE
Leukocytes, UA: NEGATIVE
Nitrite: NEGATIVE
Specific Gravity, Urine: <=1.005 — AB (ref 1.000–1.030)
TOTAL PROTEIN, URINE-UPE24: NEGATIVE
URINE GLUCOSE: NEGATIVE
Urobilinogen, UA: 0.2 (ref 0.0–1.0)
pH: 7 (ref 5.0–8.0)

## 2015-05-17 MED ORDER — METRONIDAZOLE 500 MG PO TABS: 500.0000 mg | ORAL_TABLET | Freq: Three times a day (TID) | ORAL | Status: DC

## 2015-05-17 NOTE — Progress Notes (Signed)
Pre visit review using our clinic review tool, if applicable. No additional management support is needed unless otherwise documented below in the visit note. 

## 2015-05-17 NOTE — Progress Notes (Signed)
Subjective:  Patient ID: Cindy Meza, female    DOB: [birth year]  Age: 79 y.o. MRN: [id number]  CC: No chief complaint on file.   HPI Cindy Meza presents for abd bloating f/u - 50% better. C/o urinary urgency x weeks  Outpatient Prescriptions Prior to Visit  Medication Sig Dispense Refill  . albuterol (VENTOLIN HFA) 108 (90 BASE) MCG/ACT inhaler Inhale 1-2 puffs into the lungs every 6 (six) hours as needed. 1 Inhaler 11  . Calcium-Magnesium-Vitamin D (CALCIUM 500 PO) Take 1 each by mouth daily.    . Cholecalciferol 1000 UNITS tablet Take 1,000 Units by mouth daily.      . fluticasone-salmeterol (ADVAIR HFA) 115-21 MCG/ACT inhaler Inhale 2 puffs into the lungs 2 (two) times daily. 1 Inhaler 11  . loratadine (CLARITIN) 10 MG tablet Take 10 mg by mouth daily as needed.      . ranitidine (ZANTAC) 150 MG tablet TAKE ONE TABLET BY MOUTH TWICE DAILY 60 tablet 11  . vitamin E (VITAMIN E) 400 UNIT capsule Take 1 capsule (400 Units total) by mouth daily. 100 capsule 1   No facility-administered medications prior to visit.    ROS Review of Systems  Constitutional: Negative for chills, activity change, appetite change, fatigue and unexpected weight change.  HENT: Negative for congestion, mouth sores and sinus pressure.   Eyes: Negative for visual disturbance.  Respiratory: Negative for cough and chest tightness.   Gastrointestinal: Positive for abdominal distention. Negative for nausea, vomiting and abdominal pain.  Genitourinary: Negative for frequency, difficulty urinating and vaginal pain.  Musculoskeletal: Negative for back pain and gait problem.  Skin: Negative for pallor and rash.  Neurological: Negative for dizziness, tremors, weakness, numbness and headaches.  Psychiatric/Behavioral: Negative for confusion and sleep disturbance.    Objective:  BP 150/80 mmHg  Pulse 87  Wt 115 lb (52.164 kg)  SpO2 97%  BP Readings from Last 3 Encounters:  05/17/15 150/80  04/15/15  130/82  03/30/15 132/84    Wt Readings from Last 3 Encounters:  05/17/15 115 lb (52.164 kg)  04/15/15 115 lb (52.164 kg)  03/30/15 116 lb (52.617 kg)    Physical Exam  Constitutional: She appears well-developed. No distress.  HENT:  Head: Normocephalic.  Right Ear: External ear normal.  Left Ear: External ear normal.  Nose: Nose normal.  Mouth/Throat: Oropharynx is clear and moist.  Eyes: Conjunctivae are normal. Pupils are equal, round, and reactive to light. Right eye exhibits no discharge. Left eye exhibits no discharge.  Neck: Normal range of motion. Neck supple. No JVD present. No tracheal deviation present. No thyromegaly present.  Cardiovascular: Normal rate, regular rhythm and normal heart sounds.   Pulmonary/Chest: No stridor. No respiratory distress. She has no wheezes.  Abdominal: Soft. Bowel sounds are normal. She exhibits no distension and no mass. There is no tenderness. There is no rebound and no guarding.  Musculoskeletal: She exhibits no edema or tenderness.  Lymphadenopathy:    She has no cervical adenopathy.  Neurological: She displays normal reflexes. No cranial nerve deficit. She exhibits normal muscle tone. Coordination normal.  Skin: No rash noted. No erythema.  Psychiatric: She has a normal mood and affect. Her behavior is normal. Judgment and thought content normal.    Lab Results  Component Value Date   WBC 7.3 02/10/2015   HGB 13.1 02/10/2015   HCT 38.9 02/10/2015   PLT 288.0 02/10/2015   GLUCOSE 82 03/30/2015   CHOL 256* 03/23/2014   TRIG 54.0 03/23/2014  HDL 129.60 03/23/2014   LDLDIRECT 104.0 03/17/2013   LDLCALC 116* 03/23/2014   ALT 10 03/30/2015   AST 17 03/30/2015   NA 139 03/30/2015   K 3.7 03/30/2015   CL 104 03/30/2015   CREATININE 0.84 03/30/2015   BUN 18 03/30/2015   CO2 32 03/30/2015   TSH 3.12 03/30/2015   HGBA1C 5.2 11/05/2014    Ct Abdomen Pelvis W Contrast  04/28/2015  CLINICAL DATA:  Low abdominal pain and bloating  since August. Improving symptoms after stopping Fosamax therapy. Initial encounter. EXAM: CT ABDOMEN AND PELVIS WITH CONTRAST TECHNIQUE: Multidetector CT imaging of the abdomen and pelvis was performed using the standard protocol following bolus administration of intravenous contrast. CONTRAST:  117mL OMNIPAQUE IOHEXOL 300 MG/ML  SOLN COMPARISON:  None. FINDINGS: Lower chest: Clear lung bases. No significant pleural or pericardial effusion. The heart is mildly enlarged. Atherosclerosis of the aorta and coronary arteries noted. Hepatobiliary: The liver is normal in density without focal abnormality. No evidence of gallstones, gallbladder wall thickening or biliary dilatation. Pancreas: Unremarkable. No pancreatic ductal dilatation or surrounding inflammatory changes. Spleen: Normal in size without focal abnormality. Adrenals/Urinary Tract: Both adrenal glands appear normal. Both kidneys appear normal without evidence of urinary tract calculus, hydronephrosis or focal cortical lesion. There is an extrarenal pelvis on the right. The bladder appears unremarkable. Stomach/Bowel: No evidence of bowel wall thickening, distention or surrounding inflammatory change. Moderate stool throughout the colon. By history, previous appendectomy. Vascular/Lymphatic: There are no enlarged abdominal or pelvic lymph nodes. Mild aortoiliac atherosclerosis. Reproductive: Hysterectomy.  No evidence of adnexal mass. Other: Small umbilical hernia containing fat. No evidence of ascites. Musculoskeletal: No acute or significant osseous findings. IMPRESSION: 1. No acute findings or explanation for the patient's symptoms. 2. Mild atherosclerosis as described. 3. Previous hysterectomy and appendectomy. Electronically Signed   By: Richardean Sale M.D.   On: 04/28/2015 17:38    Assessment & Plan:   Diagnoses and all orders for this visit:  Lower abdominal pain -     Urinalysis; Future  Urgency of urination  Other orders -      metroNIDAZOLE (FLAGYL) 500 MG tablet; Take 1 tablet (500 mg total) by mouth 3 (three) times daily.  I am having [name] start on metroNIDAZOLE. I am also having her maintain her loratadine, Cholecalciferol, Calcium-Magnesium-Vitamin D (CALCIUM 500 PO), albuterol, ranitidine, vitamin E, and fluticasone-salmeterol.  Meds ordered this encounter  Medications  . metroNIDAZOLE (FLAGYL) 500 MG tablet    Sig: Take 1 tablet (500 mg total) by mouth 3 (three) times daily.    Dispense:  21 tablet    Refill:  1     Follow-up: Return in about 4 weeks (around 06/14/2015) for a follow-up visit.  [name], MD

## 2015-05-17 NOTE — Assessment & Plan Note (Addendum)
lower ?fosamax related abd bloating S/p gyn eval, abd CT (-) 11/16 50% better On Zantac Try Flagyl. Use Activia FDGuard, BDGuard prn RTC 1 mo. GI and Urol ref if not 100% better

## 2015-05-17 NOTE — Assessment & Plan Note (Signed)
Try Flagyl. UA FDGuard, BDGuard prn RTC 1 mo. GI and Urol ref if not 100% better

## 2015-06-29 ENCOUNTER — Encounter: Payer: Self-pay | Admitting: Internal Medicine

## 2015-06-29 ENCOUNTER — Ambulatory Visit (INDEPENDENT_AMBULATORY_CARE_PROVIDER_SITE_OTHER): Payer: Medicare Other | Admitting: Internal Medicine

## 2015-06-29 VITALS — BP 140/85 | HR 78 | Wt 114.5 lb

## 2015-06-29 DIAGNOSIS — I2510 Atherosclerotic heart disease of native coronary artery without angina pectoris: Secondary | ICD-10-CM | POA: Insufficient documentation

## 2015-06-29 MED ORDER — MEGARED OMEGA-3 KRILL OIL 500 MG PO CAPS: 1.0000 | ORAL_CAPSULE | Freq: Every morning | ORAL | Status: DC

## 2015-06-29 MED ORDER — ASPIRIN EC 81 MG PO TBEC: 81.0000 mg | DELAYED_RELEASE_TABLET | Freq: Every day | ORAL | Status: DC

## 2015-06-29 NOTE — Progress Notes (Signed)
Pre visit review using our clinic review tool, if applicable. No additional management support is needed unless otherwise documented below in the visit note. 

## 2015-06-29 NOTE — Progress Notes (Signed)
Subjective:  Patient ID: Cindy Meza, female    DOB: 1936-02-22  Age: 79 y.o. MRN: [id number]  CC: No chief complaint on file.   HPI PHILOMENE FORSHAW presents for abd pain - 95% better. F/u GERD, bone loss...  Outpatient Prescriptions Prior to Visit  Medication Sig Dispense Refill  . albuterol (VENTOLIN HFA) 108 (90 BASE) MCG/ACT inhaler Inhale 1-2 puffs into the lungs every 6 (six) hours as needed. 1 Inhaler 11  . Calcium-Magnesium-Vitamin D (CALCIUM 500 PO) Take 1 each by mouth daily.    . Cholecalciferol 1000 UNITS tablet Take 1,000 Units by mouth daily.      . fluticasone-salmeterol (ADVAIR HFA) 115-21 MCG/ACT inhaler Inhale 2 puffs into the lungs 2 (two) times daily. 1 Inhaler 11  . loratadine (CLARITIN) 10 MG tablet Take 10 mg by mouth daily as needed.      . ranitidine (ZANTAC) 150 MG tablet TAKE ONE TABLET BY MOUTH TWICE DAILY 60 tablet 11  . vitamin E (VITAMIN E) 400 UNIT capsule Take 1 capsule (400 Units total) by mouth daily. 100 capsule 1  . metroNIDAZOLE (FLAGYL) 500 MG tablet Take 1 tablet (500 mg total) by mouth 3 (three) times daily. (Patient not taking: Reported on 06/29/2015) 21 tablet 1   No facility-administered medications prior to visit.    ROS Review of Systems  Constitutional: Negative for chills, activity change, appetite change, fatigue and unexpected weight change.  HENT: Negative for congestion, mouth sores and sinus pressure.   Eyes: Negative for visual disturbance.  Respiratory: Negative for cough, chest tightness and wheezing.   Cardiovascular: Negative for chest pain.  Gastrointestinal: Negative for nausea, vomiting and abdominal pain.  Genitourinary: Negative for urgency, frequency, difficulty urinating and vaginal pain.  Musculoskeletal: Negative for back pain and gait problem.  Skin: Negative for pallor and rash.  Neurological: Negative for dizziness, tremors, weakness, numbness and headaches.  Psychiatric/Behavioral: Negative for confusion  and sleep disturbance. The patient is not nervous/anxious.     Objective:  BP 140/90 mmHg  Pulse 78  Wt 114 lb 8 oz (51.937 kg)  SpO2 96%  BP Readings from Last 3 Encounters:  06/29/15 140/90  05/17/15 150/80  04/15/15 130/82    Wt Readings from Last 3 Encounters:  06/29/15 114 lb 8 oz (51.937 kg)  05/17/15 115 lb (52.164 kg)  04/15/15 115 lb (52.164 kg)    Physical Exam  Constitutional: She appears well-developed. No distress.  HENT:  Head: Normocephalic.  Right Ear: External ear normal.  Left Ear: External ear normal.  Nose: Nose normal.  Mouth/Throat: Oropharynx is clear and moist.  Eyes: Conjunctivae are normal. Pupils are equal, round, and reactive to light. Right eye exhibits no discharge. Left eye exhibits no discharge.  Neck: Normal range of motion. Neck supple. No JVD present. No tracheal deviation present. No thyromegaly present.  Cardiovascular: Normal rate, regular rhythm and normal heart sounds.   Pulmonary/Chest: No stridor. No respiratory distress. She has no wheezes.  Abdominal: Soft. Bowel sounds are normal. She exhibits no distension and no mass. There is no tenderness. There is no rebound and no guarding.  Musculoskeletal: She exhibits no edema or tenderness.  Lymphadenopathy:    She has no cervical adenopathy.  Neurological: She displays normal reflexes. No cranial nerve deficit. She exhibits normal muscle tone. Coordination normal.  Skin: No rash noted. No erythema.  Psychiatric: She has a normal mood and affect. Her behavior is normal. Judgment and thought content normal.    Lab Results  Component Value Date   WBC 7.3 02/10/2015   HGB 13.1 02/10/2015   HCT 38.9 02/10/2015   PLT 288.0 02/10/2015   GLUCOSE 82 03/30/2015   CHOL 256* 03/23/2014   TRIG 54.0 03/23/2014   HDL 129.60 03/23/2014   LDLDIRECT 104.0 03/17/2013   LDLCALC 116* 03/23/2014   ALT 10 03/30/2015   AST 17 03/30/2015   NA 139 03/30/2015   K 3.7 03/30/2015   CL 104 03/30/2015    CREATININE 0.84 03/30/2015   BUN 18 03/30/2015   CO2 32 03/30/2015   TSH 3.12 03/30/2015   HGBA1C 5.2 11/05/2014    Ct Abdomen Pelvis W Contrast  04/28/2015  CLINICAL DATA:  Low abdominal pain and bloating since August. Improving symptoms after stopping Fosamax therapy. Initial encounter. EXAM: CT ABDOMEN AND PELVIS WITH CONTRAST TECHNIQUE: Multidetector CT imaging of the abdomen and pelvis was performed using the standard protocol following bolus administration of intravenous contrast. CONTRAST:  142mL OMNIPAQUE IOHEXOL 300 MG/ML  SOLN COMPARISON:  None. FINDINGS: Lower chest: Clear lung bases. No significant pleural or pericardial effusion. The heart is mildly enlarged. Atherosclerosis of the aorta and coronary arteries noted. Hepatobiliary: The liver is normal in density without focal abnormality. No evidence of gallstones, gallbladder wall thickening or biliary dilatation. Pancreas: Unremarkable. No pancreatic ductal dilatation or surrounding inflammatory changes. Spleen: Normal in size without focal abnormality. Adrenals/Urinary Tract: Both adrenal glands appear normal. Both kidneys appear normal without evidence of urinary tract calculus, hydronephrosis or focal cortical lesion. There is an extrarenal pelvis on the right. The bladder appears unremarkable. Stomach/Bowel: No evidence of bowel wall thickening, distention or surrounding inflammatory change. Moderate stool throughout the colon. By history, previous appendectomy. Vascular/Lymphatic: There are no enlarged abdominal or pelvic lymph nodes. Mild aortoiliac atherosclerosis. Reproductive: Hysterectomy.  No evidence of adnexal mass. Other: Small umbilical hernia containing fat. No evidence of ascites. Musculoskeletal: No acute or significant osseous findings. IMPRESSION: 1. No acute findings or explanation for the patient's symptoms. 2. Mild atherosclerosis as described. 3. Previous hysterectomy and appendectomy. Electronically Signed   By:  Richardean Sale M.D.   On: 04/28/2015 17:38    Assessment & Plan:   There are no diagnoses linked to this encounter. I have discontinued Ms. Liebman's metroNIDAZOLE. I am also having her maintain her loratadine, Cholecalciferol, Calcium-Magnesium-Vitamin D (CALCIUM 500 PO), albuterol, ranitidine, vitamin E, and fluticasone-salmeterol.  No orders of the defined types were placed in this encounter.     Follow-up: No Follow-up on file.  [name], MD

## 2015-08-25 ENCOUNTER — Other Ambulatory Visit: Payer: Self-pay | Admitting: Internal Medicine

## 2015-09-27 ENCOUNTER — Ambulatory Visit (INDEPENDENT_AMBULATORY_CARE_PROVIDER_SITE_OTHER): Payer: Medicare Other | Admitting: Internal Medicine

## 2015-09-27 ENCOUNTER — Encounter: Payer: Self-pay | Admitting: Internal Medicine

## 2015-09-27 VITALS — BP 120/78 | HR 87 | Wt 118.0 lb

## 2015-09-27 DIAGNOSIS — J4520 Mild intermittent asthma, uncomplicated: Secondary | ICD-10-CM

## 2015-09-27 DIAGNOSIS — R519 Headache, unspecified: Secondary | ICD-10-CM | POA: Insufficient documentation

## 2015-09-27 DIAGNOSIS — G4489 Other headache syndrome: Secondary | ICD-10-CM

## 2015-09-27 DIAGNOSIS — R51 Headache: Secondary | ICD-10-CM

## 2015-09-27 MED ORDER — FLUTICASONE-SALMETEROL 115-21 MCG/ACT IN AERO: 2.0000 | INHALATION_SPRAY | Freq: Two times a day (BID) | RESPIRATORY_TRACT | Status: DC

## 2015-09-27 MED ORDER — RANITIDINE HCL 150 MG PO TABS: 150.0000 mg | ORAL_TABLET | Freq: Two times a day (BID) | ORAL | Status: DC

## 2015-09-27 NOTE — Assessment & Plan Note (Signed)
3/17 x weeks, throbbing, bumped her head weeks ago  CT head

## 2015-09-27 NOTE — Progress Notes (Signed)
Pre visit review using our clinic review tool, if applicable. No additional management support is needed unless otherwise documented below in the visit note. 

## 2015-09-27 NOTE — Progress Notes (Signed)
Subjective:  Patient ID: Cindy Meza, female    DOB: 01-Jan-1936  Age: 80 y.o. MRN: [id number]  CC: No chief complaint on file.   HPI Cindy Meza presents for asthma, osteoporosis, dyslipidemia. C/o throbbing on he top of her head off and on x weeks  Outpatient Prescriptions Prior to Visit  Medication Sig Dispense Refill  . albuterol (VENTOLIN HFA) 108 (90 BASE) MCG/ACT inhaler Inhale 1-2 puffs into the lungs every 6 (six) hours as needed. 1 Inhaler 11  . aspirin EC 81 MG tablet Take 1 tablet (81 mg total) by mouth daily. 100 tablet 3  . Calcium-Magnesium-Vitamin D (CALCIUM 500 PO) Take 1 each by mouth daily.    . Cholecalciferol 1000 UNITS tablet Take 1,000 Units by mouth daily.      . fluticasone-salmeterol (ADVAIR HFA) 115-21 MCG/ACT inhaler Inhale 2 puffs into the lungs 2 (two) times daily. 1 Inhaler 11  . loratadine (CLARITIN) 10 MG tablet Take 10 mg by mouth daily as needed.      Marland Kitchen MEGARED OMEGA-3 KRILL OIL 500 MG CAPS Take 1 capsule by mouth every morning. 100 capsule 3  . ranitidine (ZANTAC) 150 MG tablet TAKE 1 TABLET BY MOUTH TWICE DAILY 60 tablet 5  . vitamin E (VITAMIN E) 400 UNIT capsule Take 1 capsule (400 Units total) by mouth daily. 100 capsule 1   No facility-administered medications prior to visit.    ROS Review of Systems  Constitutional: Negative for chills, activity change, appetite change, fatigue and unexpected weight change.  HENT: Negative for congestion, mouth sores and sinus pressure.   Eyes: Negative for visual disturbance.  Respiratory: Negative for cough and chest tightness.   Gastrointestinal: Negative for nausea and abdominal pain.  Genitourinary: Negative for frequency, difficulty urinating and vaginal pain.  Musculoskeletal: Negative for back pain and gait problem.  Skin: Negative for pallor and rash.  Neurological: Positive for headaches. Negative for dizziness, tremors, weakness and numbness.  Psychiatric/Behavioral: Negative for  confusion and sleep disturbance. The patient is nervous/anxious.     Objective:  BP 120/78 mmHg  Pulse 87  Wt 118 lb (53.524 kg)  SpO2 97%  BP Readings from Last 3 Encounters:  09/27/15 120/78  06/29/15 140/85  05/17/15 150/80    Wt Readings from Last 3 Encounters:  09/27/15 118 lb (53.524 kg)  06/29/15 114 lb 8 oz (51.937 kg)  05/17/15 115 lb (52.164 kg)    Physical Exam  Constitutional: She appears well-developed. No distress.  HENT:  Head: Normocephalic.  Right Ear: External ear normal.  Left Ear: External ear normal.  Nose: Nose normal.  Mouth/Throat: Oropharynx is clear and moist.  Eyes: Conjunctivae are normal. Pupils are equal, round, and reactive to light. Right eye exhibits no discharge. Left eye exhibits no discharge.  Neck: Normal range of motion. Neck supple. No JVD present. No tracheal deviation present. No thyromegaly present.  Cardiovascular: Normal rate, regular rhythm and normal heart sounds.   Pulmonary/Chest: No stridor. No respiratory distress. She has no wheezes.  Abdominal: Soft. Bowel sounds are normal. She exhibits no distension and no mass. There is no tenderness. There is no rebound and no guarding.  Musculoskeletal: She exhibits no edema or tenderness.  Lymphadenopathy:    She has no cervical adenopathy.  Neurological: She displays normal reflexes. No cranial nerve deficit. She exhibits normal muscle tone. Coordination normal.  Skin: No rash noted. No erythema.  Psychiatric: She has a normal mood and affect. Her behavior is normal. Judgment and thought  content normal.    Lab Results  Component Value Date   WBC 7.3 02/10/2015   HGB 13.1 02/10/2015   HCT 38.9 02/10/2015   PLT 288.0 02/10/2015   GLUCOSE 82 03/30/2015   CHOL 256* 03/23/2014   TRIG 54.0 03/23/2014   HDL 129.60 03/23/2014   LDLDIRECT 104.0 03/17/2013   LDLCALC 116* 03/23/2014   ALT 10 03/30/2015   AST 17 03/30/2015   NA 139 03/30/2015   K 3.7 03/30/2015   CL 104  03/30/2015   CREATININE 0.84 03/30/2015   BUN 18 03/30/2015   CO2 32 03/30/2015   TSH 3.12 03/30/2015   HGBA1C 5.2 11/05/2014    Ct Abdomen Pelvis W Contrast  04/28/2015  CLINICAL DATA:  Low abdominal pain and bloating since August. Improving symptoms after stopping Fosamax therapy. Initial encounter. EXAM: CT ABDOMEN AND PELVIS WITH CONTRAST TECHNIQUE: Multidetector CT imaging of the abdomen and pelvis was performed using the standard protocol following bolus administration of intravenous contrast. CONTRAST:  164mL OMNIPAQUE IOHEXOL 300 MG/ML  SOLN COMPARISON:  None. FINDINGS: Lower chest: Clear lung bases. No significant pleural or pericardial effusion. The heart is mildly enlarged. Atherosclerosis of the aorta and coronary arteries noted. Hepatobiliary: The liver is normal in density without focal abnormality. No evidence of gallstones, gallbladder wall thickening or biliary dilatation. Pancreas: Unremarkable. No pancreatic ductal dilatation or surrounding inflammatory changes. Spleen: Normal in size without focal abnormality. Adrenals/Urinary Tract: Both adrenal glands appear normal. Both kidneys appear normal without evidence of urinary tract calculus, hydronephrosis or focal cortical lesion. There is an extrarenal pelvis on the right. The bladder appears unremarkable. Stomach/Bowel: No evidence of bowel wall thickening, distention or surrounding inflammatory change. Moderate stool throughout the colon. By history, previous appendectomy. Vascular/Lymphatic: There are no enlarged abdominal or pelvic lymph nodes. Mild aortoiliac atherosclerosis. Reproductive: Hysterectomy.  No evidence of adnexal mass. Other: Small umbilical hernia containing fat. No evidence of ascites. Musculoskeletal: No acute or significant osseous findings. IMPRESSION: 1. No acute findings or explanation for the patient's symptoms. 2. Mild atherosclerosis as described. 3. Previous hysterectomy and appendectomy. Electronically  Signed   By: Richardean Sale M.D.   On: 04/28/2015 17:38    Assessment & Plan:   There are no diagnoses linked to this encounter. I am having Cindy Meza maintain her loratadine, Cholecalciferol, Calcium-Magnesium-Vitamin D (CALCIUM 500 PO), albuterol, vitamin E, fluticasone-salmeterol, aspirin EC, MEGARED OMEGA-3 KRILL OIL, and ranitidine.  No orders of the defined types were placed in this encounter.     Follow-up: No Follow-up on file.  [name], MD

## 2015-09-30 ENCOUNTER — Ambulatory Visit (INDEPENDENT_AMBULATORY_CARE_PROVIDER_SITE_OTHER)
Admission: RE | Admit: 2015-09-30 | Discharge: 2015-09-30 | Disposition: A | Discharge status: Home / Self Care | Payer: Medicare Other | Source: Ambulatory Visit | Attending: Internal Medicine | Admitting: Internal Medicine

## 2015-09-30 DIAGNOSIS — G4489 Other headache syndrome: Secondary | ICD-10-CM

## 2015-10-05 ENCOUNTER — Encounter: Payer: Self-pay | Admitting: Internal Medicine

## 2015-10-05 NOTE — Assessment & Plan Note (Signed)
Doing well 

## 2015-12-28 ENCOUNTER — Ambulatory Visit (INDEPENDENT_AMBULATORY_CARE_PROVIDER_SITE_OTHER): Payer: Medicare Other | Admitting: Internal Medicine

## 2015-12-28 ENCOUNTER — Encounter: Payer: Self-pay | Admitting: Internal Medicine

## 2015-12-28 VITALS — BP 140/72 | HR 77 | Wt 119.0 lb

## 2015-12-28 DIAGNOSIS — E559 Vitamin D deficiency, unspecified: Secondary | ICD-10-CM

## 2015-12-28 DIAGNOSIS — K219 Gastro-esophageal reflux disease without esophagitis: Secondary | ICD-10-CM

## 2015-12-28 DIAGNOSIS — J4520 Mild intermittent asthma, uncomplicated: Secondary | ICD-10-CM

## 2015-12-28 DIAGNOSIS — R030 Elevated blood-pressure reading, without diagnosis of hypertension: Secondary | ICD-10-CM

## 2015-12-28 DIAGNOSIS — IMO0001 Reserved for inherently not codable concepts without codable children: Secondary | ICD-10-CM

## 2015-12-28 DIAGNOSIS — I2510 Atherosclerotic heart disease of native coronary artery without angina pectoris: Secondary | ICD-10-CM

## 2015-12-28 NOTE — Assessment & Plan Note (Signed)
On Vit D Core strengthening exercises 

## 2015-12-28 NOTE — Assessment & Plan Note (Signed)
BP Readings from Last 3 Encounters:  12/28/15 140/72  09/27/15 120/78  06/29/15 140/85

## 2015-12-28 NOTE — Progress Notes (Signed)
Subjective:  Patient ID: Cindy Meza, female    DOB: [birth year]  Age: 80 y.o. MRN: [id number]  CC: No chief complaint on file.   HPI Cindy Meza presents for asthma, dyslipidemia, vit D def  Outpatient Prescriptions Prior to Visit  Medication Sig Dispense Refill  . albuterol (VENTOLIN HFA) 108 (90 BASE) MCG/ACT inhaler Inhale 1-2 puffs into the lungs every 6 (six) hours as needed. 1 Inhaler 11  . aspirin EC 81 MG tablet Take 1 tablet (81 mg total) by mouth daily. 100 tablet 3  . Calcium-Magnesium-Vitamin D (CALCIUM 500 PO) Take 1 each by mouth daily.    . Cholecalciferol 1000 UNITS tablet Take 1,000 Units by mouth daily.      . fluticasone-salmeterol (ADVAIR HFA) 115-21 MCG/ACT inhaler Inhale 2 puffs into the lungs 2 (two) times daily. 1 Inhaler 11  . loratadine (CLARITIN) 10 MG tablet Take 10 mg by mouth daily as needed.      Marland Kitchen MEGARED OMEGA-3 KRILL OIL 500 MG CAPS Take 1 capsule by mouth every morning. 100 capsule 3  . ranitidine (ZANTAC) 150 MG tablet Take 1 tablet (150 mg total) by mouth 2 (two) times daily. 60 tablet 5  . vitamin E (VITAMIN E) 400 UNIT capsule Take 1 capsule (400 Units total) by mouth daily. 100 capsule 1   No facility-administered medications prior to visit.    ROS Review of Systems  Constitutional: Negative for chills, activity change, appetite change, fatigue and unexpected weight change.  HENT: Negative for congestion, mouth sores and sinus pressure.   Eyes: Negative for visual disturbance.  Respiratory: Negative for cough and chest tightness.   Gastrointestinal: Negative for nausea and abdominal pain.  Genitourinary: Negative for frequency, difficulty urinating and vaginal pain.  Musculoskeletal: Negative for back pain and gait problem.  Skin: Negative for pallor and rash.  Neurological: Negative for dizziness, tremors, weakness, numbness and headaches.  Psychiatric/Behavioral: Negative for suicidal ideas, confusion and sleep disturbance. The  patient is nervous/anxious.     Objective:  BP 140/72 mmHg  Pulse 77  Wt 119 lb (53.978 kg)  SpO2 98%  BP Readings from Last 3 Encounters:  12/28/15 140/72  09/27/15 120/78  06/29/15 140/85    Wt Readings from Last 3 Encounters:  12/28/15 119 lb (53.978 kg)  09/27/15 118 lb (53.524 kg)  06/29/15 114 lb 8 oz (51.937 kg)    Physical Exam  Constitutional: She appears well-developed. No distress.  HENT:  Head: Normocephalic.  Right Ear: External ear normal.  Left Ear: External ear normal.  Nose: Nose normal.  Mouth/Throat: Oropharynx is clear and moist.  Eyes: Conjunctivae are normal. Pupils are equal, round, and reactive to light. Right eye exhibits no discharge. Left eye exhibits no discharge.  Neck: Normal range of motion. Neck supple. No JVD present. No tracheal deviation present. No thyromegaly present.  Cardiovascular: Normal rate, regular rhythm and normal heart sounds.   Pulmonary/Chest: No stridor. No respiratory distress. She has no wheezes.  Abdominal: Soft. Bowel sounds are normal. She exhibits no distension and no mass. There is no tenderness. There is no rebound and no guarding.  Musculoskeletal: She exhibits tenderness. She exhibits no edema.  Lymphadenopathy:    She has no cervical adenopathy.  Neurological: She displays normal reflexes. No cranial nerve deficit. She exhibits normal muscle tone. Coordination normal.  Skin: No rash noted. No erythema.  Psychiatric: She has a normal mood and affect. Her behavior is normal. Judgment and thought content normal.  Lab Results  Component Value Date   WBC 7.3 02/10/2015   HGB 13.1 02/10/2015   HCT 38.9 02/10/2015   PLT 288.0 02/10/2015   GLUCOSE 82 03/30/2015   CHOL 256* 03/23/2014   TRIG 54.0 03/23/2014   HDL 129.60 03/23/2014   LDLDIRECT 104.0 03/17/2013   LDLCALC 116* 03/23/2014   ALT 10 03/30/2015   AST 17 03/30/2015   NA 139 03/30/2015   K 3.7 03/30/2015   CL 104 03/30/2015   CREATININE 0.84  03/30/2015   BUN 18 03/30/2015   CO2 32 03/30/2015   TSH 3.12 03/30/2015   HGBA1C 5.2 11/05/2014    Ct Head Wo Contrast  09/30/2015  CLINICAL DATA:  Head injury 3 weeks ago.  Headache. EXAM: CT HEAD WITHOUT CONTRAST TECHNIQUE: Contiguous axial images were obtained from the base of the skull through the vertex without intravenous contrast. COMPARISON:  None. FINDINGS: Brain parenchyma, ventricular system, and extra-axial space are within normal limits. No mass effect, midline shift, or acute hemorrhage. Cranium is intact. Mastoid air cells are clear. Visualized paranasal sinuses are clear. IMPRESSION: No acute intracranial pathology. Electronically Signed   By: Marybelle Killings M.D.   On: 09/30/2015 13:30    Assessment & Plan:   There are no diagnoses linked to this encounter. I am having [name] maintain her loratadine, Cholecalciferol, Calcium-Magnesium-Vitamin D (CALCIUM 500 PO), albuterol, vitamin E, aspirin EC, MEGARED OMEGA-3 KRILL OIL, fluticasone-salmeterol, and ranitidine.  No orders of the defined types were placed in this encounter.     Follow-up: No Follow-up on file.  [name], MD

## 2015-12-28 NOTE — Assessment & Plan Note (Signed)
Advair 

## 2015-12-28 NOTE — Progress Notes (Signed)
Pre visit review using our clinic review tool, if applicable. No additional management support is needed unless otherwise documented below in the visit note. 

## 2015-12-28 NOTE — Assessment & Plan Note (Signed)
Doing well 

## 2015-12-28 NOTE — Assessment & Plan Note (Signed)
ASA, krill oil 

## 2016-02-07 ENCOUNTER — Encounter: Payer: Self-pay | Admitting: Internal Medicine

## 2016-02-15 ENCOUNTER — Ambulatory Visit (INDEPENDENT_AMBULATORY_CARE_PROVIDER_SITE_OTHER): Payer: Medicare Other | Admitting: Internal Medicine

## 2016-02-15 ENCOUNTER — Encounter: Payer: Self-pay | Admitting: Internal Medicine

## 2016-02-15 ENCOUNTER — Other Ambulatory Visit (INDEPENDENT_AMBULATORY_CARE_PROVIDER_SITE_OTHER): Payer: Medicare Other

## 2016-02-15 VITALS — BP 139/80 | HR 76 | Temp 97.6°F | Wt 120.0 lb

## 2016-02-15 DIAGNOSIS — R0609 Other forms of dyspnea: Secondary | ICD-10-CM | POA: Diagnosis not present

## 2016-02-15 DIAGNOSIS — R5383 Other fatigue: Secondary | ICD-10-CM

## 2016-02-15 DIAGNOSIS — R202 Paresthesia of skin: Secondary | ICD-10-CM | POA: Diagnosis not present

## 2016-02-15 DIAGNOSIS — J4520 Mild intermittent asthma, uncomplicated: Secondary | ICD-10-CM | POA: Diagnosis not present

## 2016-02-15 DIAGNOSIS — I2510 Atherosclerotic heart disease of native coronary artery without angina pectoris: Secondary | ICD-10-CM

## 2016-02-15 DIAGNOSIS — R030 Elevated blood-pressure reading, without diagnosis of hypertension: Secondary | ICD-10-CM

## 2016-02-15 DIAGNOSIS — D649 Anemia, unspecified: Secondary | ICD-10-CM

## 2016-02-15 DIAGNOSIS — IMO0001 Reserved for inherently not codable concepts without codable children: Secondary | ICD-10-CM

## 2016-02-15 DIAGNOSIS — N644 Mastodynia: Secondary | ICD-10-CM

## 2016-02-15 LAB — URINALYSIS
Bilirubin Urine: NEGATIVE
Hgb urine dipstick: NEGATIVE
Ketones, ur: NEGATIVE
LEUKOCYTES UA: NEGATIVE
Nitrite: NEGATIVE
PH: 6 (ref 5.0–8.0)
Total Protein, Urine: NEGATIVE
UROBILINOGEN UA: 0.2 (ref 0.0–1.0)
Urine Glucose: NEGATIVE

## 2016-02-15 LAB — BASIC METABOLIC PANEL
BUN: 15 mg/dL (ref 6–23)
CHLORIDE: 102 meq/L (ref 96–112)
CO2: 32 meq/L (ref 19–32)
Calcium: 9.8 mg/dL (ref 8.4–10.5)
Creatinine, Ser: 0.83 mg/dL (ref 0.40–1.20)
GFR: 85.49 mL/min (ref >60.00)
Glucose, Bld: 82 mg/dL (ref 70–99)
POTASSIUM: 3.7 meq/L (ref 3.5–5.1)
SODIUM: 138 meq/L (ref 135–145)

## 2016-02-15 LAB — CBC WITH DIFFERENTIAL/PLATELET
BASOS PCT: 0.5 % (ref 0.0–3.0)
Basophils Absolute: 0 10*3/uL (ref 0.0–0.1)
EOS PCT: 5.1 % — AB (ref 0.0–5.0)
Eosinophils Absolute: 0.4 10*3/uL (ref 0.0–0.7)
HCT: 37.4 % (ref 36.0–46.0)
Hemoglobin: 12.6 g/dL (ref 12.0–15.0)
LYMPHS ABS: 2.5 10*3/uL (ref 0.7–4.0)
Lymphocytes Relative: 31.6 % (ref 12.0–46.0)
MCHC: 33.7 g/dL (ref 30.0–36.0)
MCV: 93 fl (ref 78.0–100.0)
MONO ABS: 0.7 10*3/uL (ref 0.1–1.0)
MONOS PCT: 9.1 % (ref 3.0–12.0)
NEUTROS PCT: 53.7 % (ref 43.0–77.0)
Neutro Abs: 4.2 10*3/uL (ref 1.4–7.7)
Platelets: 301 10*3/uL (ref 150.0–400.0)
RBC: 4.02 Mil/uL (ref 3.87–5.11)
RDW: 12.8 % (ref 11.5–15.5)
WBC: 7.9 10*3/uL (ref 4.0–10.5)

## 2016-02-15 LAB — SEDIMENTATION RATE: Sed Rate: 37 mm/hr — ABNORMAL HIGH (ref 0–30)

## 2016-02-15 LAB — TSH: TSH: 2.37 u[IU]/mL (ref 0.35–4.50)

## 2016-02-15 NOTE — Assessment & Plan Note (Signed)
Asthma vs other EKG ok Card ref Labs

## 2016-02-15 NOTE — Progress Notes (Signed)
Pre visit review using our clinic review tool, if applicable. No additional management support is needed unless otherwise documented below in the visit note. 

## 2016-02-15 NOTE — Assessment & Plan Note (Signed)
CBC

## 2016-02-15 NOTE — Assessment & Plan Note (Signed)
S/p mammo/US at [facility] 8/17

## 2016-02-15 NOTE — Assessment & Plan Note (Signed)
Advair 

## 2016-02-15 NOTE — Assessment & Plan Note (Signed)
Labs

## 2016-02-15 NOTE — Assessment & Plan Note (Addendum)
Statin intolerant ASA, krill oil Cardiol ref

## 2016-02-15 NOTE — Assessment & Plan Note (Signed)
Recurrent sx's Discussed

## 2016-02-15 NOTE — Progress Notes (Signed)
Subjective:  Patient ID: Cindy Meza, female    DOB: February 21, 1936  Age: 80 y.o. MRN: [id number]  CC: Numbness (in Left arm x 10 days); Fatigue; and Shortness of Breath (when up walking around x 10 days)   HPI Cindy Meza presents for fatigue, DOE x 10 days. C/o hoarseness. F/u asthma, CAD  Outpatient Medications Prior to Visit  Medication Sig Dispense Refill  . albuterol (VENTOLIN HFA) 108 (90 BASE) MCG/ACT inhaler Inhale 1-2 puffs into the lungs every 6 (six) hours as needed. 1 Inhaler 11  . aspirin EC 81 MG tablet Take 1 tablet (81 mg total) by mouth daily. 100 tablet 3  . Calcium-Magnesium-Vitamin D (CALCIUM 500 PO) Take 1 each by mouth daily.    . Cholecalciferol 1000 UNITS tablet Take 1,000 Units by mouth daily.      . fluticasone-salmeterol (ADVAIR HFA) 115-21 MCG/ACT inhaler Inhale 2 puffs into the lungs 2 (two) times daily. 1 Inhaler 11  . loratadine (CLARITIN) 10 MG tablet Take 10 mg by mouth daily as needed.      Marland Kitchen MEGARED OMEGA-3 KRILL OIL 500 MG CAPS Take 1 capsule by mouth every morning. 100 capsule 3  . ranitidine (ZANTAC) 150 MG tablet Take 1 tablet (150 mg total) by mouth 2 (two) times daily. 60 tablet 5  . vitamin E (VITAMIN E) 400 UNIT capsule Take 1 capsule (400 Units total) by mouth daily. 100 capsule 1   No facility-administered medications prior to visit.     ROS Review of Systems  Constitutional: Positive for fatigue. Negative for activity change, appetite change, chills and unexpected weight change.  HENT: Positive for voice change. Negative for congestion, mouth sores and sinus pressure.   Eyes: Negative for visual disturbance.  Respiratory: Positive for cough and shortness of breath. Negative for chest tightness.   Gastrointestinal: Negative for abdominal pain and nausea.  Genitourinary: Negative for difficulty urinating, frequency and vaginal pain.  Musculoskeletal: Negative for back pain and gait problem.  Skin: Negative for pallor and rash.    Neurological: Negative for dizziness, tremors, weakness, numbness and headaches.  Psychiatric/Behavioral: Negative for confusion and sleep disturbance.    Objective:  BP 140/80 (BP Location: Left Arm, Cuff Size: Normal)   Pulse 76   Temp 97.6 F (36.4 C) (Oral)   Wt 120 lb (54.4 kg)   SpO2 97%   BMI 21.26 kg/m   BP Readings from Last 3 Encounters:  02/15/16 140/80  12/28/15 140/72  09/27/15 120/78    Wt Readings from Last 3 Encounters:  02/15/16 120 lb (54.4 kg)  12/28/15 119 lb (54 kg)  09/27/15 118 lb (53.5 kg)    Physical Exam  Constitutional: She appears well-developed. No distress.  HENT:  Head: Normocephalic.  Right Ear: External ear normal.  Left Ear: External ear normal.  Nose: Nose normal.  Mouth/Throat: Oropharynx is clear and moist.  Eyes: Conjunctivae are normal. Pupils are equal, round, and reactive to light. Right eye exhibits no discharge. Left eye exhibits no discharge.  Neck: Normal range of motion. Neck supple. No JVD present. No tracheal deviation present. No thyromegaly present.  Cardiovascular: Normal rate, regular rhythm and normal heart sounds.   Pulmonary/Chest: No stridor. No respiratory distress. She has no wheezes.  Abdominal: Soft. Bowel sounds are normal. She exhibits no distension and no mass. There is no tenderness. There is no rebound and no guarding.  Musculoskeletal: She exhibits no edema or tenderness.  Lymphadenopathy:    She has no cervical adenopathy.  Neurological: She displays normal reflexes. No cranial nerve deficit. She exhibits normal muscle tone. Coordination normal.  Skin: No rash noted. No erythema.  Psychiatric: She has a normal mood and affect. Her behavior is normal. Judgment and thought content normal.    Procedure: EKG Indication: DOE Impression: NSR. No acute/new changes.   Lab Results  Component Value Date   WBC 7.3 02/10/2015   HGB 13.1 02/10/2015   HCT 38.9 02/10/2015   PLT 288.0 02/10/2015   GLUCOSE 82  03/30/2015   CHOL 256 (H) 03/23/2014   TRIG 54.0 03/23/2014   HDL 129.60 03/23/2014   LDLDIRECT 104.0 03/17/2013   LDLCALC 116 (H) 03/23/2014   ALT 10 03/30/2015   AST 17 03/30/2015   NA 139 03/30/2015   K 3.7 03/30/2015   CL 104 03/30/2015   CREATININE 0.84 03/30/2015   BUN 18 03/30/2015   CO2 32 03/30/2015   TSH 3.12 03/30/2015   HGBA1C 5.2 11/05/2014    Ct Head Wo Contrast  Result Date: 09/30/2015 CLINICAL DATA:  Head injury 3 weeks ago.  Headache. EXAM: CT HEAD WITHOUT CONTRAST TECHNIQUE: Contiguous axial images were obtained from the base of the skull through the vertex without intravenous contrast. COMPARISON:  None. FINDINGS: Brain parenchyma, ventricular system, and extra-axial space are within normal limits. No mass effect, midline shift, or acute hemorrhage. Cranium is intact. Mastoid air cells are clear. Visualized paranasal sinuses are clear. IMPRESSION: No acute intracranial pathology. Electronically Signed   By: Marybelle Killings M.D.   On: 09/30/2015 13:30    Assessment & Plan:   Cindy Meza was seen today for numbness, fatigue and shortness of breath.  Diagnoses and all orders for this visit:  Paresthesia of arm  Other fatigue  Asthma, mild intermittent, uncomplicated  Atherosclerosis of native coronary artery of native heart without angina pectoris  DOE (dyspnea on exertion) -     EKG 12-Lead   I am having Cindy Meza maintain her loratadine, Cholecalciferol, Calcium-Magnesium-Vitamin D (CALCIUM 500 PO), albuterol, vitamin E, aspirin EC, MEGARED OMEGA-3 KRILL OIL, fluticasone-salmeterol, and ranitidine.  No orders of the defined types were placed in this encounter.    Follow-up: No Follow-up on file.  [name], MD

## 2016-02-15 NOTE — Assessment & Plan Note (Signed)
Nl BP at home 

## 2016-03-27 ENCOUNTER — Encounter: Payer: Self-pay | Admitting: Internal Medicine

## 2016-04-04 ENCOUNTER — Ambulatory Visit (INDEPENDENT_AMBULATORY_CARE_PROVIDER_SITE_OTHER): Payer: Medicare Other | Admitting: Internal Medicine

## 2016-04-04 ENCOUNTER — Encounter: Payer: Self-pay | Admitting: Internal Medicine

## 2016-04-04 DIAGNOSIS — H6122 Impacted cerumen, left ear: Secondary | ICD-10-CM | POA: Diagnosis not present

## 2016-04-04 DIAGNOSIS — E559 Vitamin D deficiency, unspecified: Secondary | ICD-10-CM

## 2016-04-04 DIAGNOSIS — M810 Age-related osteoporosis without current pathological fracture: Secondary | ICD-10-CM | POA: Diagnosis not present

## 2016-04-04 DIAGNOSIS — I2510 Atherosclerotic heart disease of native coronary artery without angina pectoris: Secondary | ICD-10-CM

## 2016-04-04 DIAGNOSIS — J4520 Mild intermittent asthma, uncomplicated: Secondary | ICD-10-CM

## 2016-04-04 DIAGNOSIS — Z0000 Encounter for general adult medical examination without abnormal findings: Secondary | ICD-10-CM

## 2016-04-04 DIAGNOSIS — Z0001 Encounter for general adult medical examination with abnormal findings: Secondary | ICD-10-CM

## 2016-04-04 DIAGNOSIS — Z23 Encounter for immunization: Secondary | ICD-10-CM

## 2016-04-04 MED ORDER — RANITIDINE HCL 150 MG PO TABS: 150.0000 mg | ORAL_TABLET | Freq: Two times a day (BID) | ORAL | 11 refills | Status: DC

## 2016-04-04 MED ORDER — FLUTICASONE-SALMETEROL 115-21 MCG/ACT IN AERO: 2.0000 | INHALATION_SPRAY | Freq: Two times a day (BID) | RESPIRATORY_TRACT | 11 refills | Status: DC

## 2016-04-04 MED ORDER — ALBUTEROL SULFATE HFA 108 (90 BASE) MCG/ACT IN AERS: 1.0000 | INHALATION_SPRAY | Freq: Four times a day (QID) | RESPIRATORY_TRACT | 11 refills | Status: DC | PRN

## 2016-04-04 MED ORDER — FLUTICASONE FUROATE-VILANTEROL 100-25 MCG/INH IN AEPB: 1.0000 | INHALATION_SPRAY | Freq: Every day | RESPIRATORY_TRACT | 5 refills | Status: DC

## 2016-04-04 NOTE — Progress Notes (Signed)
Pre visit review using our clinic review tool, if applicable. No additional management support is needed unless otherwise documented below in the visit note. 

## 2016-04-04 NOTE — Assessment & Plan Note (Signed)
Worse Use Advair bid - not qd Try Breo qd

## 2016-04-04 NOTE — Assessment & Plan Note (Signed)
Vit D Exercise class 2/wk

## 2016-04-04 NOTE — Patient Instructions (Signed)
Preventive Care for Adults, Female A healthy lifestyle and preventive care can promote health and wellness. Preventive health guidelines for women include the following key practices.  A routine yearly physical is a good way to check with your health care provider about your health and preventive screening. It is a chance to share any concerns and updates on your health and to receive a thorough exam.  Visit your dentist for a routine exam and preventive care every 6 months. Brush your teeth twice a day and floss once a day. Good oral hygiene prevents tooth decay and gum disease.  The frequency of eye exams is based on your age, health, family medical history, use of contact lenses, and other factors. Follow your health care provider's recommendations for frequency of eye exams.  Eat a healthy diet. Foods like vegetables, fruits, whole grains, low-fat dairy products, and lean protein foods contain the nutrients you need without too many calories. Decrease your intake of foods high in solid fats, added sugars, and salt. Eat the right amount of calories for you.Get information about a proper diet from your health care provider, if necessary.  Regular physical exercise is one of the most important things you can do for your health. Most adults should get at least 150 minutes of moderate-intensity exercise (any activity that increases your heart rate and causes you to sweat) each week. In addition, most adults need muscle-strengthening exercises on 2 or more days a week.  Maintain a healthy weight. The body mass index (BMI) is a screening tool to identify possible weight problems. It provides an estimate of body fat based on height and weight. Your health care provider can find your BMI and can help you achieve or maintain a healthy weight.For adults 20 years and older:  A BMI below 18.5 is considered underweight.  A BMI of 18.5 to 24.9 is normal.  A BMI of 25 to 29.9 is considered overweight.  A  BMI of 30 and above is considered obese.  Maintain normal blood lipids and cholesterol levels by exercising and minimizing your intake of saturated fat. Eat a balanced diet with plenty of fruit and vegetables. Blood tests for lipids and cholesterol should begin at age 45 and be repeated every 5 years. If your lipid or cholesterol levels are high, you are over 50, or you are at high risk for heart disease, you may need your cholesterol levels checked more frequently.Ongoing high lipid and cholesterol levels should be treated with medicines if diet and exercise are not working.  If you smoke, find out from your health care provider how to quit. If you do not use tobacco, do not start.  Lung cancer screening is recommended for adults aged 45-80 years who are at high risk for developing lung cancer because of a history of smoking. A yearly low-dose CT scan of the lungs is recommended for people who have at least a 30-pack-year history of smoking and are a current smoker or have quit within the past 15 years. A pack year of smoking is smoking an average of 1 pack of cigarettes a day for 1 year (for example: 1 pack a day for 30 years or 2 packs a day for 15 years). Yearly screening should continue until the smoker has stopped smoking for at least 15 years. Yearly screening should be stopped for people who develop a health problem that would prevent them from having lung cancer treatment.  If you are pregnant, do not drink alcohol. If you are  breastfeeding, be very cautious about drinking alcohol. If you are not pregnant and choose to drink alcohol, do not have more than 1 drink per day. One drink is considered to be 12 ounces (355 mL) of beer, 5 ounces (148 mL) of wine, or 1.5 ounces (44 mL) of liquor.  Avoid use of street drugs. Do not share needles with anyone. Ask for help if you need support or instructions about stopping the use of drugs.  High blood pressure causes heart disease and increases the risk  of stroke. Your blood pressure should be checked at least every 1 to 2 years. Ongoing high blood pressure should be treated with medicines if weight loss and exercise do not work.  If you are 55-79 years old, ask your health care provider if you should take aspirin to prevent strokes.  Diabetes screening is done by taking a blood sample to check your blood glucose level after you have not eaten for a certain period of time (fasting). If you are not overweight and you do not have risk factors for diabetes, you should be screened once every 3 years starting at age 45. If you are overweight or obese and you are 40-70 years of age, you should be screened for diabetes every year as part of your cardiovascular risk assessment.  Breast cancer screening is essential preventive care for women. You should practice "breast self-awareness." This means understanding the normal appearance and feel of your breasts and may include breast self-examination. Any changes detected, no matter how small, should be reported to a health care provider. Women in their 20s and 30s should have a clinical breast exam (CBE) by a health care provider as part of a regular health exam every 1 to 3 years. After age 40, women should have a CBE every year. Starting at age 40, women should consider having a mammogram (breast X-ray test) every year. Women who have a family history of breast cancer should talk to their health care provider about genetic screening. Women at a high risk of breast cancer should talk to their health care providers about having an MRI and a mammogram every year.  Breast cancer gene (BRCA)-related cancer risk assessment is recommended for women who have family members with BRCA-related cancers. BRCA-related cancers include breast, ovarian, tubal, and peritoneal cancers. Having family members with these cancers may be associated with an increased risk for harmful changes (mutations) in the breast cancer genes BRCA1 and  BRCA2. Results of the assessment will determine the need for genetic counseling and BRCA1 and BRCA2 testing.  Your health care provider may recommend that you be screened regularly for cancer of the pelvic organs (ovaries, uterus, and vagina). This screening involves a pelvic examination, including checking for microscopic changes to the surface of your cervix (Pap test). You may be encouraged to have this screening done every 3 years, beginning at age 21.  For women ages 30-65, health care providers may recommend pelvic exams and Pap testing every 3 years, or they may recommend the Pap and pelvic exam, combined with testing for human papilloma virus (HPV), every 5 years. Some types of HPV increase your risk of cervical cancer. Testing for HPV may also be done on women of any age with unclear Pap test results.  Other health care providers may not recommend any screening for nonpregnant women who are considered low risk for pelvic cancer and who do not have symptoms. Ask your health care provider if a screening pelvic exam is right for   you.  If you have had past treatment for cervical cancer or a condition that could lead to cancer, you need Pap tests and screening for cancer for at least 20 years after your treatment. If Pap tests have been discontinued, your risk factors (such as having a new sexual partner) need to be reassessed to determine if screening should resume. Some women have medical problems that increase the chance of getting cervical cancer. In these cases, your health care provider may recommend more frequent screening and Pap tests.  Colorectal cancer can be detected and often prevented. Most routine colorectal cancer screening begins at the age of 50 years and continues through age 75 years. However, your health care provider may recommend screening at an earlier age if you have risk factors for colon cancer. On a yearly basis, your health care provider may provide home test kits to check  for hidden blood in the stool. Use of a small camera at the end of a tube, to directly examine the colon (sigmoidoscopy or colonoscopy), can detect the earliest forms of colorectal cancer. Talk to your health care provider about this at age 50, when routine screening begins. Direct exam of the colon should be repeated every 5-10 years through age 75 years, unless early forms of precancerous polyps or small growths are found.  People who are at an increased risk for hepatitis B should be screened for this virus. You are considered at high risk for hepatitis B if:  You were born in a country where hepatitis B occurs often. Talk with your health care provider about which countries are considered high risk.  Your parents were born in a high-risk country and you have not received a shot to protect against hepatitis B (hepatitis B vaccine).  You have HIV or AIDS.  You use needles to inject street drugs.  You live with, or have sex with, someone who has hepatitis B.  You get hemodialysis treatment.  You take certain medicines for conditions like cancer, organ transplantation, and autoimmune conditions.  Hepatitis C blood testing is recommended for all people born from 1945 through 1965 and any individual with known risks for hepatitis C.  Practice safe sex. Use condoms and avoid high-risk sexual practices to reduce the spread of sexually transmitted infections (STIs). STIs include gonorrhea, chlamydia, syphilis, trichomonas, herpes, HPV, and human immunodeficiency virus (HIV). Herpes, HIV, and HPV are viral illnesses that have no cure. They can result in disability, cancer, and death.  You should be screened for sexually transmitted illnesses (STIs) including gonorrhea and chlamydia if:  You are sexually active and are younger than 24 years.  You are older than 24 years and your health care provider tells you that you are at risk for this type of infection.  Your sexual activity has changed  since you were last screened and you are at an increased risk for chlamydia or gonorrhea. Ask your health care provider if you are at risk.  If you are at risk of being infected with HIV, it is recommended that you take a prescription medicine daily to prevent HIV infection. This is called preexposure prophylaxis (PrEP). You are considered at risk if:  You are sexually active and do not regularly use condoms or know the HIV status of your partner(s).  You take drugs by injection.  You are sexually active with a partner who has HIV.  Talk with your health care provider about whether you are at high risk of being infected with HIV. If   you choose to begin PrEP, you should first be tested for HIV. You should then be tested every 3 months for as long as you are taking PrEP.  Osteoporosis is a disease in which the bones lose minerals and strength with aging. This can result in serious bone fractures or breaks. The risk of osteoporosis can be identified using a bone density scan. Women ages 67 years and over and women at risk for fractures or osteoporosis should discuss screening with their health care providers. Ask your health care provider whether you should take a calcium supplement or vitamin D to reduce the rate of osteoporosis.  Menopause can be associated with physical symptoms and risks. Hormone replacement therapy is available to decrease symptoms and risks. You should talk to your health care provider about whether hormone replacement therapy is right for you.  Use sunscreen. Apply sunscreen liberally and repeatedly throughout the day. You should seek shade when your shadow is shorter than you. Protect yourself by wearing long sleeves, pants, a wide-brimmed hat, and sunglasses year round, whenever you are outdoors.  Once a month, do a whole body skin exam, using a mirror to look at the skin on your back. Tell your health care provider of new moles, moles that have irregular borders, moles that  are larger than a pencil eraser, or moles that have changed in shape or color.  Stay current with required vaccines (immunizations).  Influenza vaccine. All adults should be immunized every year.  Tetanus, diphtheria, and acellular pertussis (Td, Tdap) vaccine. Pregnant women should receive 1 dose of Tdap vaccine during each pregnancy. The dose should be obtained regardless of the length of time since the last dose. Immunization is preferred during the 27th-36th week of gestation. An adult who has not previously received Tdap or who does not know her vaccine status should receive 1 dose of Tdap. This initial dose should be followed by tetanus and diphtheria toxoids (Td) booster doses every 10 years. Adults with an unknown or incomplete history of completing a 3-dose immunization series with Td-containing vaccines should begin or complete a primary immunization series including a Tdap dose. Adults should receive a Td booster every 10 years.  Varicella vaccine. An adult without evidence of immunity to varicella should receive 2 doses or a second dose if she has previously received 1 dose. Pregnant females who do not have evidence of immunity should receive the first dose after pregnancy. This first dose should be obtained before leaving the health care facility. The second dose should be obtained 4-8 weeks after the first dose.  Human papillomavirus (HPV) vaccine. Females aged 13-26 years who have not received the vaccine previously should obtain the 3-dose series. The vaccine is not recommended for use in pregnant females. However, pregnancy testing is not needed before receiving a dose. If a female is found to be pregnant after receiving a dose, no treatment is needed. In that case, the remaining doses should be delayed until after the pregnancy. Immunization is recommended for any person with an immunocompromised condition through the age of 61 years if she did not get any or all doses earlier. During the  3-dose series, the second dose should be obtained 4-8 weeks after the first dose. The third dose should be obtained 24 weeks after the first dose and 16 weeks after the second dose.  Zoster vaccine. One dose is recommended for adults aged 30 years or older unless certain conditions are present.  Measles, mumps, and rubella (MMR) vaccine. Adults born  before 1957 generally are considered immune to measles and mumps. Adults born in 1957 or later should have 1 or more doses of MMR vaccine unless there is a contraindication to the vaccine or there is laboratory evidence of immunity to each of the three diseases. A routine second dose of MMR vaccine should be obtained at least 28 days after the first dose for students attending postsecondary schools, health care workers, or international travelers. People who received inactivated measles vaccine or an unknown type of measles vaccine during 1963-1967 should receive 2 doses of MMR vaccine. People who received inactivated mumps vaccine or an unknown type of mumps vaccine before 1979 and are at high risk for mumps infection should consider immunization with 2 doses of MMR vaccine. For females of childbearing age, rubella immunity should be determined. If there is no evidence of immunity, females who are not pregnant should be vaccinated. If there is no evidence of immunity, females who are pregnant should delay immunization until after pregnancy. Unvaccinated health care workers born before 1957 who lack laboratory evidence of measles, mumps, or rubella immunity or laboratory confirmation of disease should consider measles and mumps immunization with 2 doses of MMR vaccine or rubella immunization with 1 dose of MMR vaccine.  Pneumococcal 13-valent conjugate (PCV13) vaccine. When indicated, a person who is uncertain of his immunization history and has no record of immunization should receive the PCV13 vaccine. All adults 65 years of age and older should receive this  vaccine. An adult aged 19 years or older who has certain medical conditions and has not been previously immunized should receive 1 dose of PCV13 vaccine. This PCV13 should be followed with a dose of pneumococcal polysaccharide (PPSV23) vaccine. Adults who are at high risk for pneumococcal disease should obtain the PPSV23 vaccine at least 8 weeks after the dose of PCV13 vaccine. Adults older than 78 years of age who have normal immune system function should obtain the PPSV23 vaccine dose at least 1 year after the dose of PCV13 vaccine.  Pneumococcal polysaccharide (PPSV23) vaccine. When PCV13 is also indicated, PCV13 should be obtained first. All adults aged 65 years and older should be immunized. An adult younger than age 65 years who has certain medical conditions should be immunized. Any person who resides in a nursing home or long-term care facility should be immunized. An adult smoker should be immunized. People with an immunocompromised condition and certain other conditions should receive both PCV13 and PPSV23 vaccines. People with human immunodeficiency virus (HIV) infection should be immunized as soon as possible after diagnosis. Immunization during chemotherapy or radiation therapy should be avoided. Routine use of PPSV23 vaccine is not recommended for American Indians, [place] Natives, or people younger than 65 years unless there are medical conditions that require PPSV23 vaccine. When indicated, people who have unknown immunization and have no record of immunization should receive PPSV23 vaccine. One-time revaccination 5 years after the first dose of PPSV23 is recommended for people aged 19-64 years who have chronic kidney failure, nephrotic syndrome, asplenia, or immunocompromised conditions. People who received 1-2 doses of PPSV23 before age 65 years should receive another dose of PPSV23 vaccine at age 65 years or later if at least 5 years have passed since the previous dose. Doses of PPSV23 are not  needed for people immunized with PPSV23 at or after age 65 years.  Meningococcal vaccine. Adults with asplenia or persistent complement component deficiencies should receive 2 doses of quadrivalent meningococcal conjugate (MenACWY-D) vaccine. The doses should be obtained   at least 2 months apart. Microbiologists working with certain meningococcal bacteria, [place] recruits, people at risk during an outbreak, and people who travel to or live in countries with a high rate of meningitis should be immunized. A first-year college student up through age 34 years who is living in a residence hall should receive a dose if she did not receive a dose on or after her 16th birthday. Adults who have certain high-risk conditions should receive one or more doses of vaccine.  Hepatitis A vaccine. Adults who wish to be protected from this disease, have certain high-risk conditions, work with hepatitis A-infected animals, work in hepatitis A research labs, or travel to or work in countries with a high rate of hepatitis A should be immunized. Adults who were previously unvaccinated and who anticipate close contact with an international adoptee during the first 60 days after arrival in the [place] from a country with a high rate of hepatitis A should be immunized.  Hepatitis B vaccine. Adults who wish to be protected from this disease, have certain high-risk conditions, may be exposed to blood or other infectious body fluids, are household contacts or sex partners of hepatitis B positive people, are clients or workers in certain care facilities, or travel to or work in countries with a high rate of hepatitis B should be immunized.  Haemophilus influenzae type b (Hib) vaccine. A previously unvaccinated person with asplenia or sickle cell disease or having a scheduled splenectomy should receive 1 dose of Hib vaccine. Regardless of previous immunization, a recipient of a hematopoietic stem cell transplant should receive a  3-dose series 6-12 months after her successful transplant. Hib vaccine is not recommended for adults with HIV infection. Preventive Services / Frequency Ages 35 to 4 years  Blood pressure check.** / Every 3-5 years.  Lipid and cholesterol check.** / Every 5 years beginning at age 60.  Clinical breast exam.** / Every 3 years for women in their 71s and 10s.  BRCA-related cancer risk assessment.** / For women who have family members with a BRCA-related cancer (breast, ovarian, tubal, or peritoneal cancers).  Pap test.** / Every 2 years from ages 76 through 26. Every 3 years starting at age 61 through age 76 or 93 with a history of 3 consecutive normal Pap tests.  HPV screening.** / Every 3 years from ages 37 through ages 60 to 51 with a history of 3 consecutive normal Pap tests.  Hepatitis C blood test.** / For any individual with known risks for hepatitis C.  Skin self-exam. / Monthly.  Influenza vaccine. / Every year.  Tetanus, diphtheria, and acellular pertussis (Tdap, Td) vaccine.** / Consult your health care provider. Pregnant women should receive 1 dose of Tdap vaccine during each pregnancy. 1 dose of Td every 10 years.  Varicella vaccine.** / Consult your health care provider. Pregnant females who do not have evidence of immunity should receive the first dose after pregnancy.  HPV vaccine. / 3 doses over 6 months, if 93 and younger. The vaccine is not recommended for use in pregnant females. However, pregnancy testing is not needed before receiving a dose.  Measles, mumps, rubella (MMR) vaccine.** / You need at least 1 dose of MMR if you were born in 1957 or later. You may also need a 2nd dose. For females of childbearing age, rubella immunity should be determined. If there is no evidence of immunity, females who are not pregnant should be vaccinated. If there is no evidence of immunity, females who are  pregnant should delay immunization until after pregnancy.  Pneumococcal  13-valent conjugate (PCV13) vaccine.** / Consult your health care provider.  Pneumococcal polysaccharide (PPSV23) vaccine.** / 1 to 2 doses if you smoke cigarettes or if you have certain conditions.  Meningococcal vaccine.** / 1 dose if you are age 68 to 8 years and a Market researcher living in a residence hall, or have one of several medical conditions, you need to get vaccinated against meningococcal disease. You may also need additional booster doses.  Hepatitis A vaccine.** / Consult your health care provider.  Hepatitis B vaccine.** / Consult your health care provider.  Haemophilus influenzae type b (Hib) vaccine.** / Consult your health care provider. Ages 7 to 53 years  Blood pressure check.** / Every year.  Lipid and cholesterol check.** / Every 5 years beginning at age 25 years.  Lung cancer screening. / Every year if you are aged 11-80 years and have a 30-pack-year history of smoking and currently smoke or have quit within the past 15 years. Yearly screening is stopped once you have quit smoking for at least 15 years or develop a health problem that would prevent you from having lung cancer treatment.  Clinical breast exam.** / Every year after age 48 years.  BRCA-related cancer risk assessment.** / For women who have family members with a BRCA-related cancer (breast, ovarian, tubal, or peritoneal cancers).  Mammogram.** / Every year beginning at age 41 years and continuing for as long as you are in good health. Consult with your health care provider.  Pap test.** / Every 3 years starting at age 65 years through age 37 or 70 years with a history of 3 consecutive normal Pap tests.  HPV screening.** / Every 3 years from ages 72 years through ages 60 to 40 years with a history of 3 consecutive normal Pap tests.  Fecal occult blood test (FOBT) of stool. / Every year beginning at age 21 years and continuing until age 5 years. You may not need to do this test if you get  a colonoscopy every 10 years.  Flexible sigmoidoscopy or colonoscopy.** / Every 5 years for a flexible sigmoidoscopy or every 10 years for a colonoscopy beginning at age 35 years and continuing until age 48 years.  Hepatitis C blood test.** / For all people born from [birth year] through 1965 and any individual with known risks for hepatitis C.  Skin self-exam. / Monthly.  Influenza vaccine. / Every year.  Tetanus, diphtheria, and acellular pertussis (Tdap/Td) vaccine.** / Consult your health care provider. Pregnant women should receive 1 dose of Tdap vaccine during each pregnancy. 1 dose of Td every 10 years.  Varicella vaccine.** / Consult your health care provider. Pregnant females who do not have evidence of immunity should receive the first dose after pregnancy.  Zoster vaccine.** / 1 dose for adults aged 30 years or older.  Measles, mumps, rubella (MMR) vaccine.** / You need at least 1 dose of MMR if you were born in 1957 or later. You may also need a second dose. For females of childbearing age, rubella immunity should be determined. If there is no evidence of immunity, females who are not pregnant should be vaccinated. If there is no evidence of immunity, females who are pregnant should delay immunization until after pregnancy.  Pneumococcal 13-valent conjugate (PCV13) vaccine.** / Consult your health care provider.  Pneumococcal polysaccharide (PPSV23) vaccine.** / 1 to 2 doses if you smoke cigarettes or if you have certain conditions.  Meningococcal vaccine.** /  Consult your health care provider.  Hepatitis A vaccine.** / Consult your health care provider.  Hepatitis B vaccine.** / Consult your health care provider.  Haemophilus influenzae type b (Hib) vaccine.** / Consult your health care provider. Ages 64 years and over  Blood pressure check.** / Every year.  Lipid and cholesterol check.** / Every 5 years beginning at age 23 years.  Lung cancer screening. / Every year if you  are aged 16-80 years and have a 30-pack-year history of smoking and currently smoke or have quit within the past 15 years. Yearly screening is stopped once you have quit smoking for at least 15 years or develop a health problem that would prevent you from having lung cancer treatment.  Clinical breast exam.** / Every year after age 74 years.  BRCA-related cancer risk assessment.** / For women who have family members with a BRCA-related cancer (breast, ovarian, tubal, or peritoneal cancers).  Mammogram.** / Every year beginning at age 44 years and continuing for as long as you are in good health. Consult with your health care provider.  Pap test.** / Every 3 years starting at age 58 years through age 22 or 39 years with 3 consecutive normal Pap tests. Testing can be stopped between 65 and 70 years with 3 consecutive normal Pap tests and no abnormal Pap or HPV tests in the past 10 years.  HPV screening.** / Every 3 years from ages 64 years through ages 70 or 61 years with a history of 3 consecutive normal Pap tests. Testing can be stopped between 65 and 70 years with 3 consecutive normal Pap tests and no abnormal Pap or HPV tests in the past 10 years.  Fecal occult blood test (FOBT) of stool. / Every year beginning at age 40 years and continuing until age 27 years. You may not need to do this test if you get a colonoscopy every 10 years.  Flexible sigmoidoscopy or colonoscopy.** / Every 5 years for a flexible sigmoidoscopy or every 10 years for a colonoscopy beginning at age 7 years and continuing until age 32 years.  Hepatitis C blood test.** / For all people born from [birth year] through 1965 and any individual with known risks for hepatitis C.  Osteoporosis screening.** / A one-time screening for women ages 30 years and over and women at risk for fractures or osteoporosis.  Skin self-exam. / Monthly.  Influenza vaccine. / Every year.  Tetanus, diphtheria, and acellular pertussis (Tdap/Td)  vaccine.** / 1 dose of Td every 10 years.  Varicella vaccine.** / Consult your health care provider.  Zoster vaccine.** / 1 dose for adults aged 35 years or older.  Pneumococcal 13-valent conjugate (PCV13) vaccine.** / Consult your health care provider.  Pneumococcal polysaccharide (PPSV23) vaccine.** / 1 dose for all adults aged 46 years and older.  Meningococcal vaccine.** / Consult your health care provider.  Hepatitis A vaccine.** / Consult your health care provider.  Hepatitis B vaccine.** / Consult your health care provider.  Haemophilus influenzae type b (Hib) vaccine.** / Consult your health care provider. ** Family history and personal history of risk and conditions may change your health care provider's recommendations.   This information is not intended to replace advice given to you by your health care provider. Make sure you discuss any questions you have with your health care provider.   Document Released: 08/21/2001 Document Revised: 07/16/2014 Document Reviewed: 11/20/2010 Elsevier Interactive Patient Education [employer].

## 2016-04-04 NOTE — Progress Notes (Signed)
Subjective:  Patient ID: Cindy Meza, female    DOB: October 06, 1935  Age: 80 y.o. MRN: [id number]  CC: No chief complaint on file.   HPI KAMARRI ASAI presents for well exam. C/o asthma; using Advair qd  Outpatient Medications Prior to Visit  Medication Sig Dispense Refill  . albuterol (VENTOLIN HFA) 108 (90 BASE) MCG/ACT inhaler Inhale 1-2 puffs into the lungs every 6 (six) hours as needed. 1 Inhaler 11  . aspirin EC 81 MG tablet Take 1 tablet (81 mg total) by mouth daily. 100 tablet 3  . Calcium-Magnesium-Vitamin D (CALCIUM 500 PO) Take 1 each by mouth daily.    . Cholecalciferol 1000 UNITS tablet Take 1,000 Units by mouth daily.      . fluticasone-salmeterol (ADVAIR HFA) 115-21 MCG/ACT inhaler Inhale 2 puffs into the lungs 2 (two) times daily. 1 Inhaler 11  . loratadine (CLARITIN) 10 MG tablet Take 10 mg by mouth daily as needed.      Marland Kitchen MEGARED OMEGA-3 KRILL OIL 500 MG CAPS Take 1 capsule by mouth every morning. 100 capsule 3  . ranitidine (ZANTAC) 150 MG tablet Take 1 tablet (150 mg total) by mouth 2 (two) times daily. 60 tablet 5  . vitamin E (VITAMIN E) 400 UNIT capsule Take 1 capsule (400 Units total) by mouth daily. 100 capsule 1   No facility-administered medications prior to visit.     ROS Review of Systems  Constitutional: Negative for activity change, appetite change, chills, fatigue and unexpected weight change.  HENT: Negative for congestion, mouth sores and sinus pressure.   Eyes: Negative for visual disturbance.  Respiratory: Negative for cough and chest tightness.   Gastrointestinal: Negative for abdominal pain and nausea.  Genitourinary: Negative for difficulty urinating, frequency and vaginal pain.  Musculoskeletal: Negative for back pain and gait problem.  Skin: Negative for pallor and rash.  Neurological: Negative for dizziness, tremors, weakness, numbness and headaches.  Psychiatric/Behavioral: Negative for confusion and sleep disturbance.    Objective:   BP 140/88   Pulse 80   Temp 97.6 F (36.4 C) (Oral)   Ht 5\' 3"  (1.6 m)   Wt 122 lb (55.3 kg)   SpO2 99%   BMI 21.61 kg/m   BP Readings from Last 3 Encounters:  04/04/16 140/88  02/15/16 139/80  12/28/15 140/72    Wt Readings from Last 3 Encounters:  04/04/16 122 lb (55.3 kg)  02/15/16 120 lb (54.4 kg)  12/28/15 119 lb (54 kg)    Physical Exam  Constitutional: She appears well-developed. No distress.  HENT:  Head: Normocephalic.  Right Ear: External ear normal.  Left Ear: External ear normal.  Nose: Nose normal.  Mouth/Throat: Oropharynx is clear and moist.  Eyes: Conjunctivae are normal. Pupils are equal, round, and reactive to light. Right eye exhibits no discharge. Left eye exhibits no discharge.  Neck: Normal range of motion. Neck supple. No JVD present. No tracheal deviation present. No thyromegaly present.  Cardiovascular: Normal rate, regular rhythm and normal heart sounds.   Pulmonary/Chest: No stridor. No respiratory distress. She has no wheezes.  Abdominal: Soft. Bowel sounds are normal. She exhibits no distension and no mass. There is no tenderness. There is no rebound and no guarding.  Musculoskeletal: She exhibits no edema or tenderness.  Lymphadenopathy:    She has no cervical adenopathy.  Neurological: She displays normal reflexes. No cranial nerve deficit. She exhibits normal muscle tone. Coordination normal.  Skin: No rash noted. No erythema.  Psychiatric: She has a  normal mood and affect. Her behavior is normal. Judgment and thought content normal.  L ear wax  Procedure Note :     Procedure :  Ear irrigation   Indication:  Cerumen impaction L   Risks, including pain, dizziness, eardrum perforation, bleeding, infection and others as well as benefits were explained to the patient in detail. Verbal consent was obtained and the patient agreed to proceed.    We used "The Elephant Ear Irrigation Device" filled with lukewarm water for irrigation. A large  amount wax was recovered. Procedure has also required manual wax removal with an ear loop.   Tolerated well. Complications: None.   Postprocedure instructions :  Call if problems.   Lab Results  Component Value Date   WBC 7.9 02/15/2016   HGB 12.6 02/15/2016   HCT 37.4 02/15/2016   PLT 301.0 02/15/2016   GLUCOSE 82 02/15/2016   CHOL 256 (H) 03/23/2014   TRIG 54.0 03/23/2014   HDL 129.60 03/23/2014   LDLDIRECT 104.0 03/17/2013   LDLCALC 116 (H) 03/23/2014   ALT 10 03/30/2015   AST 17 03/30/2015   NA 138 02/15/2016   K 3.7 02/15/2016   CL 102 02/15/2016   CREATININE 0.83 02/15/2016   BUN 15 02/15/2016   CO2 32 02/15/2016   TSH 2.37 02/15/2016   HGBA1C 5.2 11/05/2014    Ct Head Wo Contrast  Result Date: 09/30/2015 CLINICAL DATA:  Head injury 3 weeks ago.  Headache. EXAM: CT HEAD WITHOUT CONTRAST TECHNIQUE: Contiguous axial images were obtained from the base of the skull through the vertex without intravenous contrast. COMPARISON:  None. FINDINGS: Brain parenchyma, ventricular system, and extra-axial space are within normal limits. No mass effect, midline shift, or acute hemorrhage. Cranium is intact. Mastoid air cells are clear. Visualized paranasal sinuses are clear. IMPRESSION: No acute intracranial pathology. Electronically Signed   By: Marybelle Killings M.D.   On: 09/30/2015 13:30    Assessment & Plan:   There are no diagnoses linked to this encounter. I am having Ms. Nicole Kindred maintain her loratadine, Cholecalciferol, Calcium-Magnesium-Vitamin D (CALCIUM 500 PO), albuterol, vitamin E, aspirin EC, MEGARED OMEGA-3 KRILL OIL, fluticasone-salmeterol, and ranitidine.  No orders of the defined types were placed in this encounter.    Follow-up: No Follow-up on file.  [name], MD

## 2016-04-04 NOTE — Assessment & Plan Note (Signed)
ASA, krill oil 

## 2016-04-04 NOTE — Assessment & Plan Note (Signed)

## 2016-04-06 DIAGNOSIS — H6120 Impacted cerumen, unspecified ear: Secondary | ICD-10-CM

## 2016-04-06 HISTORY — DX: Impacted cerumen, unspecified ear: H61.20

## 2016-04-06 NOTE — Assessment & Plan Note (Signed)
Will irrigate 

## 2016-04-09 ENCOUNTER — Ambulatory Visit (INDEPENDENT_AMBULATORY_CARE_PROVIDER_SITE_OTHER): Payer: Medicare Other | Admitting: Internal Medicine

## 2016-04-09 ENCOUNTER — Encounter: Payer: Self-pay | Admitting: Internal Medicine

## 2016-04-09 VITALS — BP 146/92 | HR 72 | Ht 63.0 in | Wt 123.0 lb

## 2016-04-09 DIAGNOSIS — R0602 Shortness of breath: Secondary | ICD-10-CM

## 2016-04-09 NOTE — Patient Instructions (Signed)
Your physician recommends that you continue on your current medications as directed. Please refer to the Current Medication list given to you today..  Your physician has requested that you have a lexiscan myoview. For further information please visit [email or web address]. Please follow instruction sheet, as given.  Your physician has requested that you have an echocardiogram. Echocardiography is a painless test that uses sound waves to create images of your heart. It provides your doctor with information about the size and shape of your heart and how well your heart's chambers and valves are working. This procedure takes approximately one hour. There are no restrictions for this procedure.

## 2016-04-09 NOTE — Progress Notes (Signed)
Cardiology Office Note   Date:  04/09/2016   ID:  Cindy Meza, DOB 1935/12/11, MRN [id number]  PCP:  [name], MD  Cardiologist:   Dorris Carnes, MD   Pt presents for f/u  Complains of fatigue      History of Present Illness: Cindy Meza is a 80 y.o. female with a history o fatigue  Pt says when she goes out to get mail if comes in gets SOB  SOme of time  Not every day  Maybe 3 to 4 x per week Last time didn't happen was 2 months ago More energy last spring NO CP   If picking up twigs will sometimes be SOB        Outpatient Medications Prior to Visit  Medication Sig Dispense Refill  . albuterol (VENTOLIN HFA) 108 (90 Base) MCG/ACT inhaler Inhale 1-2 puffs into the lungs every 6 (six) hours as needed. 1 Inhaler 11  . aspirin EC 81 MG tablet Take 1 tablet (81 mg total) by mouth daily. 100 tablet 3  . Calcium-Magnesium-Vitamin D (CALCIUM 500 PO) Take 1 each by mouth daily.    . Cholecalciferol 1000 UNITS tablet Take 1,000 Units by mouth daily.      . fluticasone furoate-vilanterol (BREO ELLIPTA) 100-25 MCG/INH AEPB Inhale 1 puff into the lungs daily. 1 each 5  . fluticasone-salmeterol (ADVAIR HFA) 115-21 MCG/ACT inhaler Inhale 2 puffs into the lungs 2 (two) times daily. 1 Inhaler 11  . loratadine (CLARITIN) 10 MG tablet Take 10 mg by mouth daily as needed.      Marland Kitchen MEGARED OMEGA-3 KRILL OIL 500 MG CAPS Take 1 capsule by mouth every morning. 100 capsule 3  . ranitidine (ZANTAC) 150 MG tablet Take 1 tablet (150 mg total) by mouth 2 (two) times daily. 60 tablet 11  . vitamin E (VITAMIN E) 400 UNIT capsule Take 1 capsule (400 Units total) by mouth daily. 100 capsule 1   No facility-administered medications prior to visit.      Allergies:   Fosamax [alendronate sodium]; Gabapentin; Lidocaine; Risedronate sodium; Shellfish allergy; and Statins   Past Medical History:  Diagnosis Date  . Asthma    Dr. Melvyn Novas  . Breast cyst    Left  . Cataract    Dr. Gershon Crane  . GERD  (gastroesophageal reflux disease)   . Hemorrhoids   . History of colonic polyps [name]   hx of adenomas (small) 1998  . OA (osteoarthritis)   . Osteopenia   . PAC (premature atrial contraction)   . Vitamin D deficiency     Past Surgical History:  Procedure Laterality Date  . ABDOMINAL HYSTERECTOMY     complete  . APPENDECTOMY    . BREAST CYST EXCISION    . COLONOSCOPY    . TONSILLECTOMY       Social History:  The patient  reports that she has never smoked. She has never used smokeless tobacco. She reports that she drinks alcohol. She reports that she does not use drugs.   Family History:  The patient's family history includes Diabetes (age of onset: 6) in her mother; Hypertension in her other.  No history of CAD known    Mother had PPM    ROS:  Please see the history of present illness. All other systems are reviewed and  Negative to the above problem except as noted.    PHYSICAL EXAM: VS:  BP (!) 146/92   Pulse 72   Ht 5\' 3"  (1.6 m)  Wt 123 lb (55.8 kg)   BMI 21.79 kg/m   GEN: Well nourished, well developed, in no acute distress  HEENT: normal  Neck: no JVD, carotid bruits, or masses Cardiac: RRR; no murmurs, rubs, or gallops,no edema  Respiratory:  clear to auscultation bilaterally, normal work of breathing GI: soft, nontender, nondistended, + BS  No hepatomegaly  MS: no deformity Moving all extremities   Skin: warm and dry, no rash Neuro:  Strength and sensation are intact Psych: euthymic mood, full affect   EKG:  EKG is not ordered today.   Lipid Panel    Component Value Date/Time   CHOL 256 (H) 03/23/2014 1053   TRIG 54.0 03/23/2014 1053   HDL 129.60 03/23/2014 1053   CHOLHDL 2 03/23/2014 1053   VLDL 10.8 03/23/2014 1053   LDLCALC 116 (H) 03/23/2014 1053   LDLDIRECT 104.0 03/17/2013 1026      Wt Readings from Last 3 Encounters:  04/09/16 123 lb (55.8 kg)  04/04/16 122 lb (55.3 kg)  02/15/16 120 lb (54.4 kg)      ASSESSMENT AND PLAN:  1   Dyspnea  Concerning  PT does have evid of plaquing of aorta and coronary arteries on CT scan I owuld recomm a stress myoview to r/o ischemia (pharm if she can't walk)  I would also recomm an echo to relook at diastolic dysfuncton  2  Lipids  Pt with CAD be on a statin  She will review  Has been on in past   Not eager to take.     Current medicines are reviewed at length with the patient today.  The patient does not have concerns regarding medicines.   Signed, Dorris Carnes, MD  04/09/2016 11:17 AM    [address], [place], [place]  [address] Phone: [phone]; Fax: [phone]

## 2016-04-10 ENCOUNTER — Telehealth: Payer: Self-pay | Admitting: Internal Medicine

## 2016-04-10 NOTE — Telephone Encounter (Signed)
New Message:   Please call,she wants to discuss the echo she is supposed to have.

## 2016-04-10 NOTE — Telephone Encounter (Signed)
Patient wanted to confirm when she was suppose to come in for her echo and stress test. Patient is aware of her appointment dates and times.

## 2016-04-19 ENCOUNTER — Telehealth (HOSPITAL_COMMUNITY): Payer: Self-pay | Admitting: *Deleted

## 2016-04-19 NOTE — Telephone Encounter (Signed)
Patient given detailed instructions per Myocardial Perfusion Study Information Sheet for the test on 04/25/16. Patient notified to arrive 15 minutes early and that it is imperative to arrive on time for appointment to keep from having the test rescheduled.  If you need to cancel or reschedule your appointment, please call the office within 24 hours of your appointment. Failure to do so may result in a cancellation of your appointment, and a $50 no show fee. Patient verbalized understanding.  Hubbard Robinson, RN

## 2016-04-20 ENCOUNTER — Ambulatory Visit (HOSPITAL_COMMUNITY): Payer: Medicare Other | Attending: Cardiology

## 2016-04-20 ENCOUNTER — Telehealth: Payer: Self-pay | Admitting: Internal Medicine

## 2016-04-20 ENCOUNTER — Other Ambulatory Visit: Payer: Self-pay

## 2016-04-20 DIAGNOSIS — R0602 Shortness of breath: Secondary | ICD-10-CM

## 2016-04-20 DIAGNOSIS — I340 Nonrheumatic mitral (valve) insufficiency: Secondary | ICD-10-CM | POA: Diagnosis not present

## 2016-04-20 DIAGNOSIS — I501 Left ventricular failure: Secondary | ICD-10-CM | POA: Insufficient documentation

## 2016-04-20 DIAGNOSIS — I351 Nonrheumatic aortic (valve) insufficiency: Secondary | ICD-10-CM | POA: Diagnosis not present

## 2016-04-20 DIAGNOSIS — I712 Thoracic aortic aneurysm, without rupture: Secondary | ICD-10-CM | POA: Insufficient documentation

## 2016-04-20 NOTE — Telephone Encounter (Signed)
Walk In Pt Form-Please call patient-Will give to [facility] when she gets back.

## 2016-04-23 ENCOUNTER — Telehealth: Payer: Self-pay | Admitting: Internal Medicine

## 2016-04-23 NOTE — Telephone Encounter (Signed)
New message   Pt verbalized that she is wanting to speak to the rn   She do not want to disclose any information to me

## 2016-04-24 NOTE — Telephone Encounter (Signed)
Reviewed echo results with patient.  Tomorrow she is having stress test.  She wanted to let Dr. Harrington Challenger know that the cholesterol medicines she has tried in the past are Zetia and Welchol.  She is currently on Omega 3 Krill Oil and stopped for about a week and feels that her fatigue and chest tightness have improved.    Continues Advair.  Advised her we will call her with results of stress test.  Forwarding to MD to inform of cholesterol medications pt has tried.

## 2016-04-25 ENCOUNTER — Ambulatory Visit (HOSPITAL_COMMUNITY): Payer: Medicare Other | Attending: Cardiology

## 2016-04-25 DIAGNOSIS — R0602 Shortness of breath: Secondary | ICD-10-CM | POA: Diagnosis not present

## 2016-04-25 LAB — MYOCARDIAL PERFUSION IMAGING
CHL CUP NUCLEAR SSS: 4
CSEPPHR: 105 {beats}/min
LHR: 0.36
LVDIAVOL: 76 mL (ref 46–106)
LVSYSVOL: 34 mL
Rest HR: 77 {beats}/min
SDS: 1
SRS: 3
TID: 0.93

## 2016-04-25 MED ORDER — TECHNETIUM TC 99M TETROFOSMIN IV KIT
10.1000 | PACK | Freq: Once | INTRAVENOUS | Status: AC | PRN
  Administered 2016-04-25: 10.1 via INTRAVENOUS
  Filled 2016-04-25: qty 11

## 2016-04-25 MED ORDER — REGADENOSON 0.4 MG/5ML IV SOLN
0.4000 mg | Freq: Once | INTRAVENOUS | Status: AC
  Administered 2016-04-25: 0.4 mg via INTRAVENOUS

## 2016-04-25 MED ORDER — TECHNETIUM TC 99M TETROFOSMIN IV KIT
31.8000 | PACK | Freq: Once | INTRAVENOUS | Status: AC | PRN
  Administered 2016-04-25: 31.8 via INTRAVENOUS
  Filled 2016-04-25: qty 32

## 2016-04-25 NOTE — Telephone Encounter (Signed)
Would check lipid panel in 2 to 3 months off of Krill oil to see levels

## 2016-05-25 ENCOUNTER — Ambulatory Visit (INDEPENDENT_AMBULATORY_CARE_PROVIDER_SITE_OTHER): Payer: Medicare Other | Admitting: Internal Medicine

## 2016-05-25 ENCOUNTER — Encounter: Payer: Self-pay | Admitting: Internal Medicine

## 2016-05-25 DIAGNOSIS — R0609 Other forms of dyspnea: Secondary | ICD-10-CM

## 2016-05-25 DIAGNOSIS — D509 Iron deficiency anemia, unspecified: Secondary | ICD-10-CM

## 2016-05-25 DIAGNOSIS — K219 Gastro-esophageal reflux disease without esophagitis: Secondary | ICD-10-CM

## 2016-05-25 DIAGNOSIS — J4520 Mild intermittent asthma, uncomplicated: Secondary | ICD-10-CM

## 2016-05-25 DIAGNOSIS — R0789 Other chest pain: Secondary | ICD-10-CM

## 2016-05-25 DIAGNOSIS — R5383 Other fatigue: Secondary | ICD-10-CM

## 2016-05-25 MED ORDER — MONTELUKAST SODIUM 10 MG PO TABS: 10.0000 mg | ORAL_TABLET | Freq: Every day | ORAL | 11 refills | Status: DC

## 2016-05-25 MED ORDER — FLUTICASONE-SALMETEROL 115-21 MCG/ACT IN AERO: 2.0000 | INHALATION_SPRAY | Freq: Two times a day (BID) | RESPIRATORY_TRACT | 11 refills | Status: DC

## 2016-05-25 NOTE — Assessment & Plan Note (Signed)
Recurrent - ?asthma related (11/17) Advair MDI - use qd, not prn We added Singulair Cardiac w/up was (-)

## 2016-05-25 NOTE — Assessment & Plan Note (Signed)
Monitor CBC 

## 2016-05-25 NOTE — Progress Notes (Signed)
Pre visit review using our clinic review tool, if applicable. No additional management support is needed unless otherwise documented below in the visit note. 

## 2016-05-25 NOTE — Assessment & Plan Note (Signed)
Advair MDI - use qd, not prn We added Singulair Cardiac w/up was (-)

## 2016-05-25 NOTE — Progress Notes (Signed)
Subjective:  Patient ID: Cindy Meza, female    DOB: 11-16-1935  Age: 80 y.o. MRN: [id number]  CC: No chief complaint on file.   HPI Cindy Meza presents for chest tightness and SOB - better this week. F/u GERD and asthma. Cardiac w/up was negative   Outpatient Medications Prior to Visit  Medication Sig Dispense Refill  . albuterol (VENTOLIN HFA) 108 (90 Base) MCG/ACT inhaler Inhale 1-2 puffs into the lungs every 6 (six) hours as needed. 1 Inhaler 11  . aspirin EC 81 MG tablet Take 1 tablet (81 mg total) by mouth daily. 100 tablet 3  . Calcium-Magnesium-Vitamin D (CALCIUM 500 PO) Take 1 each by mouth daily.    . Cholecalciferol 1000 UNITS tablet Take 1,000 Units by mouth daily.      . fluticasone furoate-vilanterol (BREO ELLIPTA) 100-25 MCG/INH AEPB Inhale 1 puff into the lungs daily. 1 each 5  . fluticasone-salmeterol (ADVAIR HFA) 115-21 MCG/ACT inhaler Inhale 2 puffs into the lungs 2 (two) times daily. 1 Inhaler 11  . loratadine (CLARITIN) 10 MG tablet Take 10 mg by mouth daily as needed.      Marland Kitchen MEGARED OMEGA-3 KRILL OIL 500 MG CAPS Take 1 capsule by mouth every morning. 100 capsule 3  . ranitidine (ZANTAC) 150 MG tablet Take 1 tablet (150 mg total) by mouth 2 (two) times daily. 60 tablet 11  . vitamin E (VITAMIN E) 400 UNIT capsule Take 1 capsule (400 Units total) by mouth daily. 100 capsule 1   No facility-administered medications prior to visit.     ROS Review of Systems  Constitutional: Positive for fatigue. Negative for activity change, appetite change, chills and unexpected weight change.  HENT: Negative for congestion, mouth sores and sinus pressure.   Eyes: Negative for visual disturbance.  Respiratory: Positive for shortness of breath. Negative for cough, chest tightness and wheezing.   Cardiovascular: Negative for chest pain and palpitations.  Gastrointestinal: Negative for abdominal pain and nausea.  Genitourinary: Negative for difficulty urinating, frequency  and vaginal pain.  Musculoskeletal: Negative for back pain and gait problem.  Skin: Negative for pallor and rash.  Neurological: Negative for dizziness, tremors, weakness, numbness and headaches.  Psychiatric/Behavioral: Negative for confusion and sleep disturbance.    Objective:  BP (!) 148/82   Pulse 73   Temp 97 F (36.1 C) (Oral)   Wt 121 lb (54.9 kg)   SpO2 96%   BMI 21.43 kg/m   BP Readings from Last 3 Encounters:  05/25/16 (!) 148/82  04/09/16 (!) 146/92  04/04/16 140/88    Wt Readings from Last 3 Encounters:  05/25/16 121 lb (54.9 kg)  04/25/16 123 lb (55.8 kg)  04/09/16 123 lb (55.8 kg)    Physical Exam  Constitutional: She appears well-developed. No distress.  HENT:  Head: Normocephalic.  Right Ear: External ear normal.  Left Ear: External ear normal.  Nose: Nose normal.  Mouth/Throat: Oropharynx is clear and moist.  Eyes: Conjunctivae are normal. Pupils are equal, round, and reactive to light. Right eye exhibits no discharge. Left eye exhibits no discharge.  Neck: Normal range of motion. Neck supple. No JVD present. No tracheal deviation present. No thyromegaly present.  Cardiovascular: Normal rate, regular rhythm and normal heart sounds.   Pulmonary/Chest: No stridor. No respiratory distress. She has no wheezes.  Abdominal: Soft. Bowel sounds are normal. She exhibits no distension and no mass. There is no tenderness. There is no rebound and no guarding.  Musculoskeletal: She exhibits no edema  or tenderness.  Lymphadenopathy:    She has no cervical adenopathy.  Neurological: She displays normal reflexes. No cranial nerve deficit. She exhibits normal muscle tone. Coordination normal.  Skin: No rash noted. No erythema.  Psychiatric: She has a normal mood and affect. Her behavior is normal. Judgment and thought content normal.    Lab Results  Component Value Date   WBC 7.9 02/15/2016   HGB 12.6 02/15/2016   HCT 37.4 02/15/2016   PLT 301.0 02/15/2016    GLUCOSE 82 02/15/2016   CHOL 256 (H) 03/23/2014   TRIG 54.0 03/23/2014   HDL 129.60 03/23/2014   LDLDIRECT 104.0 03/17/2013   LDLCALC 116 (H) 03/23/2014   ALT 10 03/30/2015   AST 17 03/30/2015   NA 138 02/15/2016   K 3.7 02/15/2016   CL 102 02/15/2016   CREATININE 0.83 02/15/2016   BUN 15 02/15/2016   CO2 32 02/15/2016   TSH 2.37 02/15/2016   HGBA1C 5.2 11/05/2014    Ct Head Wo Contrast  Result Date: 09/30/2015 CLINICAL DATA:  Head injury 3 weeks ago.  Headache. EXAM: CT HEAD WITHOUT CONTRAST TECHNIQUE: Contiguous axial images were obtained from the base of the skull through the vertex without intravenous contrast. COMPARISON:  None. FINDINGS: Brain parenchyma, ventricular system, and extra-axial space are within normal limits. No mass effect, midline shift, or acute hemorrhage. Cranium is intact. Mastoid air cells are clear. Visualized paranasal sinuses are clear. IMPRESSION: No acute intracranial pathology. Electronically Signed   By: Marybelle Killings M.D.   On: 09/30/2015 13:30    Assessment & Plan:   There are no diagnoses linked to this encounter. I am having Cindy Meza maintain her loratadine, Cholecalciferol, Calcium-Magnesium-Vitamin D (CALCIUM 500 PO), vitamin E, aspirin EC, MEGARED OMEGA-3 KRILL OIL, ranitidine, albuterol, fluticasone furoate-vilanterol, and fluticasone-salmeterol.  No orders of the defined types were placed in this encounter.    Follow-up: No Follow-up on file.  [name], MD

## 2016-05-25 NOTE — Assessment & Plan Note (Signed)
Cardiac w/up was (-) - --- Dr Harrington Challenger Add Singulair

## 2016-05-25 NOTE — Assessment & Plan Note (Signed)
Advair MDI - use qd, not prn We added Singulair

## 2016-05-25 NOTE — Assessment & Plan Note (Signed)
Zantac bid

## 2016-05-26 ENCOUNTER — Encounter: Payer: Self-pay | Admitting: Internal Medicine

## 2016-06-12 ENCOUNTER — Emergency Department (HOSPITAL_COMMUNITY): Payer: Medicare Other

## 2016-06-12 ENCOUNTER — Inpatient Hospital Stay (HOSPITAL_COMMUNITY)
Admission: EM | Admit: 2016-06-12 | Discharge: 2016-06-15 | DRG: 481 | Disposition: A | Discharge status: Skilled Nursing Facility | Payer: Medicare Other | Attending: Internal Medicine | Admitting: Internal Medicine

## 2016-06-12 ENCOUNTER — Encounter (HOSPITAL_COMMUNITY): Payer: Self-pay

## 2016-06-12 DIAGNOSIS — Z79899 Other long term (current) drug therapy: Secondary | ICD-10-CM

## 2016-06-12 DIAGNOSIS — Z7982 Long term (current) use of aspirin: Secondary | ICD-10-CM | POA: Diagnosis not present

## 2016-06-12 DIAGNOSIS — D62 Acute posthemorrhagic anemia: Secondary | ICD-10-CM | POA: Diagnosis not present

## 2016-06-12 DIAGNOSIS — D72829 Elevated white blood cell count, unspecified: Secondary | ICD-10-CM | POA: Diagnosis present

## 2016-06-12 DIAGNOSIS — I700 Atherosclerosis of aorta: Secondary | ICD-10-CM | POA: Diagnosis present

## 2016-06-12 DIAGNOSIS — Y92009 Unspecified place in unspecified non-institutional (private) residence as the place of occurrence of the external cause: Secondary | ICD-10-CM

## 2016-06-12 DIAGNOSIS — Z888 Allergy status to other drugs, medicaments and biological substances status: Secondary | ICD-10-CM

## 2016-06-12 DIAGNOSIS — H269 Unspecified cataract: Secondary | ICD-10-CM | POA: Diagnosis present

## 2016-06-12 DIAGNOSIS — W19XXXD Unspecified fall, subsequent encounter: Secondary | ICD-10-CM | POA: Diagnosis not present

## 2016-06-12 DIAGNOSIS — S72001A Fracture of unspecified part of neck of right femur, initial encounter for closed fracture: Secondary | ICD-10-CM | POA: Diagnosis present

## 2016-06-12 DIAGNOSIS — Z91013 Allergy to seafood: Secondary | ICD-10-CM

## 2016-06-12 DIAGNOSIS — W19XXXA Unspecified fall, initial encounter: Secondary | ICD-10-CM | POA: Diagnosis not present

## 2016-06-12 DIAGNOSIS — M1990 Unspecified osteoarthritis, unspecified site: Secondary | ICD-10-CM | POA: Diagnosis present

## 2016-06-12 DIAGNOSIS — W19XXXS Unspecified fall, sequela: Secondary | ICD-10-CM | POA: Diagnosis not present

## 2016-06-12 DIAGNOSIS — Z8249 Family history of ischemic heart disease and other diseases of the circulatory system: Secondary | ICD-10-CM | POA: Diagnosis not present

## 2016-06-12 DIAGNOSIS — R262 Difficulty in walking, not elsewhere classified: Secondary | ICD-10-CM

## 2016-06-12 DIAGNOSIS — Z86010 Personal history of colonic polyps: Secondary | ICD-10-CM

## 2016-06-12 DIAGNOSIS — W1830XA Fall on same level, unspecified, initial encounter: Secondary | ICD-10-CM | POA: Diagnosis present

## 2016-06-12 DIAGNOSIS — E559 Vitamin D deficiency, unspecified: Secondary | ICD-10-CM | POA: Diagnosis present

## 2016-06-12 DIAGNOSIS — K219 Gastro-esophageal reflux disease without esophagitis: Secondary | ICD-10-CM | POA: Diagnosis present

## 2016-06-12 DIAGNOSIS — M810 Age-related osteoporosis without current pathological fracture: Secondary | ICD-10-CM | POA: Diagnosis present

## 2016-06-12 DIAGNOSIS — S72141A Displaced intertrochanteric fracture of right femur, initial encounter for closed fracture: Principal | ICD-10-CM | POA: Diagnosis present

## 2016-06-12 DIAGNOSIS — Z8781 Personal history of (healed) traumatic fracture: Secondary | ICD-10-CM

## 2016-06-12 DIAGNOSIS — J45909 Unspecified asthma, uncomplicated: Secondary | ICD-10-CM | POA: Diagnosis present

## 2016-06-12 DIAGNOSIS — Z419 Encounter for procedure for purposes other than remedying health state, unspecified: Secondary | ICD-10-CM

## 2016-06-12 HISTORY — DX: Fracture of unspecified part of neck of right femur, initial encounter for closed fracture: S72.001A

## 2016-06-12 HISTORY — DX: Other complications of anesthesia, initial encounter: T88.59XA

## 2016-06-12 HISTORY — DX: Adverse effect of unspecified anesthetic, initial encounter: T41.45XA

## 2016-06-12 LAB — BASIC METABOLIC PANEL
ANION GAP: 8 (ref 5–15)
BUN: 18 mg/dL (ref 6–20)
CHLORIDE: 102 mmol/L (ref 101–111)
CO2: 27 mmol/L (ref 22–32)
Calcium: 9 mg/dL (ref 8.9–10.3)
Creatinine, Ser: 0.8 mg/dL (ref 0.44–1.00)
GFR calc Af Amer: >60 mL/min (ref >60)
GFR calc non Af Amer: >60 mL/min (ref >60)
GLUCOSE: 155 mg/dL — AB (ref 65–99)
POTASSIUM: 3.5 mmol/L (ref 3.5–5.1)
Sodium: 137 mmol/L (ref 135–145)

## 2016-06-12 LAB — CBC WITH DIFFERENTIAL/PLATELET
BASOS ABS: 0 10*3/uL (ref 0.0–0.1)
Basophils Relative: 0 %
EOS PCT: 0 %
Eosinophils Absolute: 0 10*3/uL (ref 0.0–0.7)
HEMATOCRIT: 33.5 % — AB (ref 36.0–46.0)
Hemoglobin: 11.1 g/dL — ABNORMAL LOW (ref 12.0–15.0)
LYMPHS ABS: 0.7 10*3/uL (ref 0.7–4.0)
LYMPHS PCT: 6 %
MCH: 31.8 pg (ref 26.0–34.0)
MCHC: 33.1 g/dL (ref 30.0–36.0)
MCV: 96 fL (ref 78.0–100.0)
MONO ABS: 0.6 10*3/uL (ref 0.1–1.0)
MONOS PCT: 5 %
NEUTROS ABS: 10.6 10*3/uL — AB (ref 1.7–7.7)
Neutrophils Relative %: 89 %
PLATELETS: 300 10*3/uL (ref 150–400)
RBC: 3.49 MIL/uL — ABNORMAL LOW (ref 3.87–5.11)
RDW: 12.6 % (ref 11.5–15.5)
WBC: 12 10*3/uL — ABNORMAL HIGH (ref 4.0–10.5)

## 2016-06-12 LAB — PROTIME-INR
INR: 1.04
Prothrombin Time: 13.7 seconds (ref 11.4–15.2)

## 2016-06-12 LAB — TYPE AND SCREEN
ABO/RH(D): B POS
ANTIBODY SCREEN: NEGATIVE

## 2016-06-12 NOTE — Progress Notes (Signed)
Patient presents to ED post fall from home.  EDCM spoke to patient at bedside.  Patient reports  she lives at home alone.  Female visitor at bedside.  Patient reports she has never had home health services before.  She reports she completes all of her ADL's on her own.  Patient reports she drives.  Patient reports she does not have any dme at home.  EDCM explained home health services to patient.  EDCM also explained possible need for rehab at discharge.  Patient verbalized understanding.  No further EDCM needs at this time.  Patient requesting pain medication.  Informed Financial controller who paged staff.

## 2016-06-12 NOTE — ED Notes (Signed)
Pt does not need to urinate at this time. Per [facility] protocol, pt not to receive a foley catheter in ED if at all possible. Not inserting foley at this time.

## 2016-06-12 NOTE — ED Notes (Signed)
Pt given 100 mcg of fentanyl en route with EMS

## 2016-06-12 NOTE — ED Provider Notes (Signed)
[place] DEPT Provider Note   CSN: [id number] Arrival date & time: 06/12/16  2022     History   Chief Complaint Chief Complaint  Patient presents with  . Fall  . Hip Pain    HPI Cindy Meza is a 80 y.o. female.  HPI  Patient presents with a mechanical fall. States that she was raking leaves her lawn when she stepped in a hole and fell. She had right  Hip pain with it. Leg is shortened and rotated. No other injury. No loss conscious. She is otherwise healthy. She did not eat dinner tonight. And  Dr. Alain Marion is  Her primary doctor.   Past Medical History:  Diagnosis Date  . Asthma    Dr. Melvyn Novas  . Breast cyst    Left  . Cataract    Dr. Gershon Crane  . GERD (gastroesophageal reflux disease)   . Hemorrhoids   . History of colonic polyps [name]   hx of adenomas (small) 1998  . OA (osteoarthritis)   . Osteopenia   . PAC (premature atrial contraction)   . Vitamin D deficiency     Patient Active Problem List   Diagnosis Date Noted  . Cerumen impaction 04/06/2016  . Fatigue 02/15/2016  . DOE (dyspnea on exertion) 02/15/2016  . Headache 09/27/2015  . Coronary atherosclerosis 06/29/2015  . Urgency of urination 05/17/2015  . Abdominal pain 03/30/2015  . Paresthesia of arm 09/21/2014  . Breast pain, left 09/21/2014  . Chest pressure 07/22/2012  . Lightheadedness 07/17/2012  . Elevated BP 07/17/2012  . Hot flash, menopausal 02/15/2012  . Allergic rhinitis, cause unspecified 09/12/2011  . Well adult exam 02/14/2011  . Anemia 10/17/2009  . HEMORRHOIDS, WITH BLEEDING 10/05/2009  . Vitamin D deficiency 06/17/2007  . Asthma 06/17/2007  . GERD 06/17/2007  . OSTEOARTHRITIS 06/17/2007  . Osteoporosis 06/17/2007  . COLONIC POLYPS, HX OF 06/17/2007    Past Surgical History:  Procedure Laterality Date  . ABDOMINAL HYSTERECTOMY     complete  . APPENDECTOMY    . BREAST CYST EXCISION    . COLONOSCOPY    . TONSILLECTOMY      OB History    No data available         Home Medications    Prior to Admission medications   Medication Sig Start Date End Date Taking? Authorizing Provider  albuterol (VENTOLIN HFA) 108 (90 Base) MCG/ACT inhaler Inhale 1-2 puffs into the lungs every 6 (six) hours as needed. Patient taking differently: Inhale 1-2 puffs into the lungs every 6 (six) hours as needed for wheezing or shortness of breath.  04/04/16  Yes Evie Lacks [name], MD  aspirin EC 81 MG tablet Take 1 tablet (81 mg total) by mouth daily. 06/29/15  Yes Cassandria Anger, MD  Calcium-Magnesium-Vitamin D (CALCIUM 500 PO) Take 1 each by mouth daily.   Yes Historical Provider, MD  Cholecalciferol 1000 UNITS tablet Take 1,000 Units by mouth daily.     Yes Historical Provider, MD  fluticasone-salmeterol (ADVAIR HFA) 115-21 MCG/ACT inhaler Inhale 2 puffs into the lungs 2 (two) times daily. 05/25/16  Yes Evie Lacks [name], MD  loratadine (CLARITIN) 10 MG tablet Take 10 mg by mouth daily.    Yes Historical Provider, MD  MEGARED OMEGA-3 KRILL OIL 500 MG CAPS Take 1 capsule by mouth every morning. 06/29/15  Yes Evie Lacks [name], MD  montelukast (SINGULAIR) 10 MG tablet Take 1 tablet (10 mg total) by mouth daily. Patient taking differently: Take 10 mg by mouth  at bedtime.  05/25/16 05/25/17 Yes Evie Lacks [name], MD  ranitidine (ZANTAC) 150 MG tablet Take 1 tablet (150 mg total) by mouth 2 (two) times daily. 04/04/16  Yes Evie Lacks [name], MD  vitamin E (VITAMIN E) 400 UNIT capsule Take 1 capsule (400 Units total) by mouth daily. 09/21/14  Yes Cassandria Anger, MD    Family History Family History  Problem Relation Age of Onset  . Diabetes Mother 70    Deceased  . Hypertension Other   . Colon cancer Neg Hx   . Pancreatic cancer Neg Hx   . Stomach cancer Neg Hx     Social History Social History  Substance Use Topics  . Smoking status: Never Smoker  . Smokeless tobacco: Never Used  . Alcohol use 0.0 oz/week     Comment: 3-4 times per week - vodka 1  drink     Allergies   Fosamax [alendronate sodium]; Gabapentin; Lidocaine; Risedronate sodium; Shellfish allergy; and Statins   Review of Systems Review of Systems  Constitutional: Negative for appetite change.  HENT: Negative for congestion.   Respiratory: Negative for shortness of breath.   Cardiovascular: Negative for leg swelling.  Gastrointestinal: Negative for abdominal pain.  Genitourinary: Negative for dysuria.  Musculoskeletal:       Right hip pain  Neurological: Negative for weakness, numbness and headaches.  Hematological: Negative for adenopathy.  Psychiatric/Behavioral: Negative for confusion.     Physical Exam Updated Vital Signs BP 126/75 (BP Location: Right Arm)   Pulse 98   Temp 99.2 F (37.3 C) (Oral)   Resp 18   SpO2 100%   Physical Exam  Constitutional: She appears well-developed.  HENT:  Head: Atraumatic.  Eyes: EOM are normal.  Neck: Neck supple.  Cardiovascular: Normal rate.   Pulmonary/Chest: Effort normal.  Abdominal: There is no tenderness.  Musculoskeletal: She exhibits tenderness.  Proximal to mid right hip/thigh area. Neurovascular intact in foot but extremity is shortened and externally rotated. No pelvic tenderness. No abdominal tenderness.  Skin: Skin is warm. Capillary refill takes less than 2 seconds.  Psychiatric: She has a normal mood and affect.     ED Treatments / Results  Labs (all labs ordered are listed, but only abnormal results are displayed) Labs Reviewed  BASIC METABOLIC PANEL - Abnormal; Notable for the following:       Result Value   Glucose, Bld 155 (*)    All other components within normal limits  CBC WITH DIFFERENTIAL/PLATELET - Abnormal; Notable for the following:    WBC 12.0 (*)    RBC 3.49 (*)    Hemoglobin 11.1 (*)    HCT 33.5 (*)    Neutro Abs 10.6 (*)    All other components within normal limits  PROTIME-INR  TYPE AND SCREEN  ABO/RH    EKG  EKG Interpretation None       Radiology Dg  Chest 1 View  Result Date: 06/12/2016 CLINICAL DATA:  80 y/o  F; status post fall. EXAM: CHEST 1 VIEW COMPARISON:  01/16/2006 chest radiograph FINDINGS: Stable cardiac silhouette within normal limits given projection and technique. Aortic atherosclerosis with arch calcification. Clear lungs. No acute osseous abnormality is evident IMPRESSION: No active disease. Electronically Signed   By: Kristine Garbe M.D.   On: 06/12/2016 22:41   Dg Hip Unilat  With Pelvis 2-3 Views Right  Result Date: 06/12/2016 CLINICAL DATA:  Initial evaluation for acute trauma. EXAM: DG HIP (WITH OR WITHOUT PELVIS) 2-3V RIGHT COMPARISON:  None. FINDINGS:  There is an acute mildly displaced intertrochanteric fracture of the right hip. Right femoral head remains normally position within the acetabulum. Femoral head height preserved. Limited views of the left hip demonstrate no acute osseous abnormality. 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 Bony pelvis intact. SI joints approximated. IMPRESSION: Acute mildly displaced intertrochanteric fracture of the right hip. Electronically Signed   By: Jeannine Boga M.D.   On: 06/12/2016 21:11   Dg Femur 1v Right  Result Date: 06/12/2016 CLINICAL DATA:  80 y/o  F; hip fracture EXAM: RIGHT FEMUR 1 VIEW COMPARISON:  None. FINDINGS: Partially visualized displaced and angulated intertrochanteric fracture of the right hip. The femoral shaft is otherwise intact. The visualized knee joint is grossly maintained. IMPRESSION: Partially visualized displaced and angulated intertrochanteric fracture of the right hip. The femoral shaft is otherwise intact. Electronically Signed   By: Kristine Garbe M.D.   On: 06/12/2016 22:43    Procedures Procedures (including critical care time)  Medications Ordered in ED Medications - No data to display   Initial Impression / Assessment and Plan / ED Course  I have reviewed the triage vital signs and the nursing  notes.  Pertinent labs & imaging results that were available during my care of the patient were reviewed by me and considered in my medical decision making (see chart for details).  Clinical Course    patient with fall. Right hip fracture. Discussed with Dr. Tamera Punt who requests transfer to [facility] for surgery tomorrow. Will be admitted by Dr. Jonnie Finner  Final Clinical Impressions(s) / ED Diagnoses   Final diagnoses:  Closed fracture of right hip, initial encounter [facility])    New Prescriptions New Prescriptions   No medications on file     Davonna Belling, MD 06/12/16 2320

## 2016-06-12 NOTE — H&P (Signed)
[facility] History and Physical  DELANEE TORNERO [id number] DOB: [birth year] DOA: 06/12/2016  Referring physician: Dr Alvino Chapel PCP: [name], MD   Chief Complaint: Fall with R hip fracture  HPI: Cindy Meza is a 80 y.o. female brought to ED by EMS after falling in the backyard.  Fell around 5pm and crawled to her house to call a neighbor. Xrays show R intertrochanteric displaced hip fracture.  Patient is stable except for some mild pain, got 100ug Fentanyl IV en route.    Did not have any associated symptoms such as CP, palpitations or syncope related to the fall.   No hx of any heart disease, HTN or DM.  No chronic medical conditions, unless GERD and osteopenia are counted as chronic medical conditions.  Is active and healthy in general, takes mostly vitamins. Takes Advair and Singulair for mild asthma, never hospitalized or been to ED for asthma. Zantac for GERD.   PSHx: TAH, appy, breast cyst excision, colonoscopy and tonsillectomy.   PMH: osteopenia, OA, colon polyps, hemorrhoids, GERD, cataract, asthma.     ROS  denies CP  no joint pain   no HA  no blurry vision  no rash  no diarrhea  no nausea/ vomiting  no dysuria  no difficulty voiding  no change in urine color    Past Medical History  Past Medical History:  Diagnosis Date  . Asthma    Dr. Melvyn Novas  . Breast cyst    Left  . Cataract    Dr. Gershon Crane  . GERD (gastroesophageal reflux disease)   . Hemorrhoids   . History of colonic polyps [name]   hx of adenomas (small) 1998  . OA (osteoarthritis)   . Osteopenia   . PAC (premature atrial contraction)   . Vitamin D deficiency    Past Surgical History  Past Surgical History:  Procedure Laterality Date  . ABDOMINAL HYSTERECTOMY     complete  . APPENDECTOMY    . BREAST CYST EXCISION    . COLONOSCOPY    . TONSILLECTOMY     Family History  Family History  Problem Relation Age of Onset  . Diabetes Mother 40    Deceased  . Hypertension  Other   . Colon cancer Neg Hx   . Pancreatic cancer Neg Hx   . Stomach cancer Neg Hx    Social History  reports that she has never smoked. She has never used smokeless tobacco. She reports that she drinks alcohol. She reports that she does not use drugs. Allergies  Allergies  Allergen Reactions  . Fosamax [Alendronate Sodium]     paresthesias  . Gabapentin     dizziness  . Lidocaine Swelling    Of face and lips.  Novocaine does same thing.  . Risedronate Sodium     Knee pain  . Shellfish Allergy Hives  . Statins     REACTION: pains   Home medications Prior to Admission medications   Medication Sig Start Date End Date Taking? Authorizing Provider  albuterol (VENTOLIN HFA) 108 (90 Base) MCG/ACT inhaler Inhale 1-2 puffs into the lungs every 6 (six) hours as needed. Patient taking differently: Inhale 1-2 puffs into the lungs every 6 (six) hours as needed for wheezing or shortness of breath.  04/04/16  Yes Evie Lacks [name], MD  aspirin EC 81 MG tablet Take 1 tablet (81 mg total) by mouth daily. 06/29/15  Yes Cassandria Anger, MD  Calcium-Magnesium-Vitamin D (CALCIUM 500 PO) Take 1 each by mouth daily.  Yes Historical Provider, MD  Cholecalciferol 1000 UNITS tablet Take 1,000 Units by mouth daily.     Yes Historical Provider, MD  fluticasone-salmeterol (ADVAIR HFA) 115-21 MCG/ACT inhaler Inhale 2 puffs into the lungs 2 (two) times daily. 05/25/16  Yes Evie Lacks [name], MD  loratadine (CLARITIN) 10 MG tablet Take 10 mg by mouth daily.    Yes Historical Provider, MD  MEGARED OMEGA-3 KRILL OIL 500 MG CAPS Take 1 capsule by mouth every morning. 06/29/15  Yes Evie Lacks [name], MD  montelukast (SINGULAIR) 10 MG tablet Take 1 tablet (10 mg total) by mouth daily. Patient taking differently: Take 10 mg by mouth at bedtime.  05/25/16 05/25/17 Yes Evie Lacks [name], MD  ranitidine (ZANTAC) 150 MG tablet Take 1 tablet (150 mg total) by mouth 2 (two) times daily. 04/04/16  Yes Evie Lacks  [name], MD  vitamin E (VITAMIN E) 400 UNIT capsule Take 1 capsule (400 Units total) by mouth daily. 09/21/14  Yes Evie Lacks [name], MD   Liver Function Tests No results for input(s): AST, ALT, ALKPHOS, BILITOT, PROT, ALBUMIN in the last 168 hours. No results for input(s): LIPASE, AMYLASE in the last 168 hours. CBC  Recent Labs Lab 06/12/16 2132  WBC 12.0*  NEUTROABS 10.6*  HGB 11.1*  HCT 33.5*  MCV 96.0  PLT [id number]   Basic Metabolic Panel  Recent Labs Lab 06/12/16 2132  NA 137  K 3.5  CL 102  CO2 27  GLUCOSE 155*  BUN 18  CREATININE 0.80  CALCIUM 9.0     Vitals:   06/12/16 2031 06/12/16 2218  BP: 162/84 126/75  Pulse: 91 98  Resp:  18  Temp: 97.4 F (36.3 C) 99.2 F (37.3 C)  TempSrc: Oral Oral  SpO2: 100% 100%   Exam: Gen alert, WDWN, no distress No rash, cyanosis or gangrene Sclera anicteric, throat clear  No jvd or bruits Chest clear bilat RRR no MRG Abd soft ntnd no mass or ascites +bs GU defer MS right thigh mild-mod edema upper 1/3rd, no ecchymosis, R leg turned out and foreshortened c/t left No pretib edema, good pedal pulses bilat Neuro is alert, Ox 3 , nf   Na 137 K 3.5  Cr 0.80  Glu 155  Hb 11.1  plt 300  INR 1.04   EKG (independ reviewed) > pending CXR (independ reviewed) > no active disease R femur xray > Partially visualized displaced and angulated intertrochanteric fracture of the right hip. The femoral shaft is otherwise intact.   Assessment: 1. R hip fracture, intertroch/ displaced - for surg Wed at [facility]. Will transfer and admit at [facility].   2. Mechanical fall  3. GERD 4. Osteopenia  Plan - admit, NPO after MN, ortho has posted pt tomorrow for surgery (Dr. Tamera Punt), pain meds, gentle IVF.  Type and screen already done, preop Hb slightly low 11's.     Roney Jaffe D [facility] Pager [phone]   If 7PM-7AM, please contact night-coverage [email or web address] Password [email or web address] 06/12/2016, 11:11 PM

## 2016-06-12 NOTE — ED Triage Notes (Signed)
Pt BIB [facility] from home s/p fall in her yard. Pt fell around 5p and had to crawl to her house and call a neighbor who called EMS. Pt R leg noticeably shortened and externally rotated. Denies LOC. Not on blood thinners. A&Ox4.

## 2016-06-12 NOTE — ED Notes (Signed)
Bed: WA03 Expected date:  Expected time:  Means of arrival:  Comments: EMS  Hip pain

## 2016-06-12 NOTE — ED Notes (Signed)
MD at bedside. 

## 2016-06-13 ENCOUNTER — Encounter (HOSPITAL_COMMUNITY)
Admission: EM | Disposition: A | Discharge status: Skilled Nursing Facility | Payer: Self-pay | Source: Home / Self Care | Attending: Internal Medicine

## 2016-06-13 ENCOUNTER — Inpatient Hospital Stay (HOSPITAL_COMMUNITY): Payer: Medicare Other

## 2016-06-13 ENCOUNTER — Encounter (HOSPITAL_COMMUNITY): Payer: Self-pay | Admitting: Certified Registered Nurse Anesthetist

## 2016-06-13 ENCOUNTER — Inpatient Hospital Stay (HOSPITAL_COMMUNITY): Payer: Medicare Other | Admitting: Anesthesiology

## 2016-06-13 DIAGNOSIS — W19XXXD Unspecified fall, subsequent encounter: Secondary | ICD-10-CM

## 2016-06-13 HISTORY — PX: INTRAMEDULLARY (IM) NAIL INTERTROCHANTERIC: SHX5875

## 2016-06-13 LAB — SURGICAL PCR SCREEN
MRSA, PCR: NEGATIVE
Staphylococcus aureus: NEGATIVE

## 2016-06-13 LAB — ABO/RH: ABO/RH(D): B POS

## 2016-06-13 SURGERY — FIXATION, FRACTURE, INTERTROCHANTERIC, WITH INTRAMEDULLARY ROD
Anesthesia: General | Site: Hip | Laterality: Right

## 2016-06-13 MED ORDER — MORPHINE SULFATE (PF) 2 MG/ML IV SOLN: 0.5000 mg | INTRAVENOUS | Status: DC | PRN

## 2016-06-13 MED ORDER — ACETAMINOPHEN 325 MG PO TABS
650.0000 mg | ORAL_TABLET | Freq: Four times a day (QID) | ORAL | Status: DC | PRN
  Administered 2016-06-13 – 2016-06-15 (×3): 650 mg via ORAL
  Filled 2016-06-13 (×3): qty 2

## 2016-06-13 MED ORDER — FENTANYL CITRATE (PF) 100 MCG/2ML IJ SOLN
INTRAMUSCULAR | Status: DC | PRN
  Administered 2016-06-13 (×3): 50 ug via INTRAVENOUS

## 2016-06-13 MED ORDER — POVIDONE-IODINE 10 % EX SWAB: 2.0000 "application " | Freq: Once | CUTANEOUS | Status: DC

## 2016-06-13 MED ORDER — PROMETHAZINE HCL 25 MG/ML IJ SOLN: 6.2500 mg | INTRAMUSCULAR | Status: DC | PRN

## 2016-06-13 MED ORDER — ONDANSETRON HCL 4 MG/2ML IJ SOLN
INTRAMUSCULAR | Status: DC | PRN
  Administered 2016-06-13: 4 mg via INTRAVENOUS

## 2016-06-13 MED ORDER — SUGAMMADEX SODIUM 200 MG/2ML IV SOLN
INTRAVENOUS | Status: AC
  Filled 2016-06-13: qty 2

## 2016-06-13 MED ORDER — CHLORHEXIDINE GLUCONATE 4 % EX LIQD: 60.0000 mL | Freq: Once | CUTANEOUS | Status: DC

## 2016-06-13 MED ORDER — CEFAZOLIN SODIUM-DEXTROSE 2-4 GM/100ML-% IV SOLN
2.0000 g | INTRAVENOUS | Status: AC
  Administered 2016-06-13: 2 g via INTRAVENOUS
  Filled 2016-06-13: qty 100

## 2016-06-13 MED ORDER — KCL IN DEXTROSE-NACL 20-5-0.45 MEQ/L-%-% IV SOLN: INTRAVENOUS | Status: DC

## 2016-06-13 MED ORDER — SODIUM CHLORIDE 0.9 % IV SOLN
INTRAVENOUS | Status: DC
  Administered 2016-06-13 – 2016-06-14 (×2): via INTRAVENOUS

## 2016-06-13 MED ORDER — HYDROMORPHONE HCL 2 MG/ML IJ SOLN
0.5000 mg | INTRAMUSCULAR | Status: DC | PRN
  Administered 2016-06-13: 0.5 mg via INTRAVENOUS
  Filled 2016-06-13: qty 1

## 2016-06-13 MED ORDER — HYDROCODONE-ACETAMINOPHEN 5-325 MG PO TABS: 1.0000 | ORAL_TABLET | Freq: Four times a day (QID) | ORAL | Status: DC | PRN

## 2016-06-13 MED ORDER — FAMOTIDINE 20 MG PO TABS
20.0000 mg | ORAL_TABLET | Freq: Two times a day (BID) | ORAL | Status: DC
  Administered 2016-06-13 – 2016-06-15 (×4): 20 mg via ORAL
  Filled 2016-06-13 (×4): qty 1

## 2016-06-13 MED ORDER — PHENYLEPHRINE HCL 10 MG/ML IJ SOLN
INTRAMUSCULAR | Status: AC
  Filled 2016-06-13: qty 1

## 2016-06-13 MED ORDER — ASPIRIN EC 325 MG PO TBEC
325.0000 mg | DELAYED_RELEASE_TABLET | Freq: Every day | ORAL | Status: DC
  Administered 2016-06-14 – 2016-06-15 (×2): 325 mg via ORAL
  Filled 2016-06-13 (×2): qty 1

## 2016-06-13 MED ORDER — LACTATED RINGERS IV SOLN
INTRAVENOUS | Status: DC
  Administered 2016-06-13 (×3): via INTRAVENOUS

## 2016-06-13 MED ORDER — MOMETASONE FURO-FORMOTEROL FUM 200-5 MCG/ACT IN AERO
2.0000 | INHALATION_SPRAY | Freq: Two times a day (BID) | RESPIRATORY_TRACT | Status: DC
  Administered 2016-06-14 (×2): 2 via RESPIRATORY_TRACT
  Filled 2016-06-13 (×2): qty 8.8

## 2016-06-13 MED ORDER — DEXAMETHASONE SODIUM PHOSPHATE 10 MG/ML IJ SOLN
INTRAMUSCULAR | Status: DC | PRN
  Administered 2016-06-13: 10 mg via INTRAVENOUS

## 2016-06-13 MED ORDER — PROPOFOL 10 MG/ML IV BOLUS
INTRAVENOUS | Status: AC
  Filled 2016-06-13: qty 20

## 2016-06-13 MED ORDER — ALBUMIN HUMAN 5 % IV SOLN
INTRAVENOUS | Status: DC | PRN
  Administered 2016-06-13: 13:00:00 via INTRAVENOUS

## 2016-06-13 MED ORDER — CEFAZOLIN SODIUM-DEXTROSE 2-4 GM/100ML-% IV SOLN
2.0000 g | Freq: Four times a day (QID) | INTRAVENOUS | Status: AC
  Administered 2016-06-13 – 2016-06-14 (×2): 2 g via INTRAVENOUS
  Filled 2016-06-13 (×2): qty 100

## 2016-06-13 MED ORDER — PROPOFOL 10 MG/ML IV BOLUS
INTRAVENOUS | Status: DC | PRN
  Administered 2016-06-13: 100 mg via INTRAVENOUS

## 2016-06-13 MED ORDER — METOCLOPRAMIDE HCL 5 MG PO TABS: 5.0000 mg | ORAL_TABLET | Freq: Three times a day (TID) | ORAL | Status: DC | PRN

## 2016-06-13 MED ORDER — MENTHOL 3 MG MT LOZG: 1.0000 | LOZENGE | OROMUCOSAL | Status: DC | PRN

## 2016-06-13 MED ORDER — PHENYLEPHRINE 40 MCG/ML (10ML) SYRINGE FOR IV PUSH (FOR BLOOD PRESSURE SUPPORT)
PREFILLED_SYRINGE | INTRAVENOUS | Status: AC
  Filled 2016-06-13: qty 10

## 2016-06-13 MED ORDER — FENTANYL CITRATE (PF) 250 MCG/5ML IJ SOLN
INTRAMUSCULAR | Status: AC
  Filled 2016-06-13: qty 5

## 2016-06-13 MED ORDER — HYDROMORPHONE HCL 1 MG/ML IJ SOLN
0.5000 mg | INTRAMUSCULAR | Status: DC | PRN
  Administered 2016-06-13: 1 mg via INTRAVENOUS
  Filled 2016-06-13: qty 1

## 2016-06-13 MED ORDER — METOCLOPRAMIDE HCL 5 MG/ML IJ SOLN
5.0000 mg | Freq: Three times a day (TID) | INTRAMUSCULAR | Status: DC | PRN
Start: 2016-06-13 — End: 2016-06-16

## 2016-06-13 MED ORDER — DEXTROSE 5 % IV SOLN
INTRAVENOUS | Status: DC | PRN
  Administered 2016-06-13: 20 ug/min via INTRAVENOUS

## 2016-06-13 MED ORDER — ACETAMINOPHEN 650 MG RE SUPP: 650.0000 mg | Freq: Four times a day (QID) | RECTAL | Status: DC | PRN

## 2016-06-13 MED ORDER — 0.9 % SODIUM CHLORIDE (POUR BTL) OPTIME
TOPICAL | Status: DC | PRN
  Administered 2016-06-13: 1000 mL

## 2016-06-13 MED ORDER — SUGAMMADEX SODIUM 200 MG/2ML IV SOLN
INTRAVENOUS | Status: DC | PRN
  Administered 2016-06-13: 200 mg via INTRAVENOUS

## 2016-06-13 MED ORDER — VITAMIN D 1000 UNITS PO TABS
1000.0000 [IU] | ORAL_TABLET | Freq: Every day | ORAL | Status: DC
  Administered 2016-06-13 – 2016-06-15 (×3): 1000 [IU] via ORAL
  Filled 2016-06-13 (×6): qty 1

## 2016-06-13 MED ORDER — PHENYLEPHRINE HCL 10 MG/ML IJ SOLN
INTRAMUSCULAR | Status: DC | PRN
  Administered 2016-06-13 (×2): 40 ug via INTRAVENOUS
  Administered 2016-06-13: 80 ug via INTRAVENOUS
  Administered 2016-06-13 (×2): 40 ug via INTRAVENOUS

## 2016-06-13 MED ORDER — ONDANSETRON HCL 4 MG/2ML IJ SOLN: 4.0000 mg | Freq: Four times a day (QID) | INTRAMUSCULAR | Status: DC | PRN

## 2016-06-13 MED ORDER — ONDANSETRON HCL 4 MG/2ML IJ SOLN
INTRAMUSCULAR | Status: AC
  Filled 2016-06-13: qty 2

## 2016-06-13 MED ORDER — DEXAMETHASONE SODIUM PHOSPHATE 10 MG/ML IJ SOLN
INTRAMUSCULAR | Status: AC
  Filled 2016-06-13: qty 1

## 2016-06-13 MED ORDER — MONTELUKAST SODIUM 10 MG PO TABS
10.0000 mg | ORAL_TABLET | Freq: Every day | ORAL | Status: DC
  Administered 2016-06-13 – 2016-06-14 (×2): 10 mg via ORAL
  Filled 2016-06-13 (×2): qty 1

## 2016-06-13 MED ORDER — ONDANSETRON HCL 4 MG PO TABS: 4.0000 mg | ORAL_TABLET | Freq: Four times a day (QID) | ORAL | Status: DC | PRN

## 2016-06-13 MED ORDER — HYDROMORPHONE HCL 1 MG/ML IJ SOLN
0.2500 mg | INTRAMUSCULAR | Status: DC | PRN
  Administered 2016-06-13: 0.5 mg via INTRAVENOUS

## 2016-06-13 MED ORDER — ASPIRIN EC 81 MG PO TBEC: 81.0000 mg | DELAYED_RELEASE_TABLET | Freq: Every day | ORAL | Status: DC

## 2016-06-13 MED ORDER — HYDROCODONE-ACETAMINOPHEN 5-325 MG PO TABS
1.0000 | ORAL_TABLET | Freq: Four times a day (QID) | ORAL | Status: DC | PRN
  Filled 2016-06-13 (×2): qty 1

## 2016-06-13 MED ORDER — ROCURONIUM BROMIDE 100 MG/10ML IV SOLN
INTRAVENOUS | Status: DC | PRN
  Administered 2016-06-13: 50 mg via INTRAVENOUS

## 2016-06-13 MED ORDER — HYDROMORPHONE HCL 1 MG/ML IJ SOLN
INTRAMUSCULAR | Status: AC
  Filled 2016-06-13: qty 0.5

## 2016-06-13 MED ORDER — PHENOL 1.4 % MT LIQD: 1.0000 | OROMUCOSAL | Status: DC | PRN

## 2016-06-13 SURGICAL SUPPLY — 41 items
BIT DRILL 4.3MMS DISTAL GRDTED (BIT) IMPLANT
BNDG COHESIVE 4X5 TAN STRL (GAUZE/BANDAGES/DRESSINGS) ×3 IMPLANT
BNDG GAUZE ELAST 4 BULKY (GAUZE/BANDAGES/DRESSINGS) ×3 IMPLANT
CHLORAPREP W/TINT 26ML (MISCELLANEOUS) ×3 IMPLANT
COVER PERINEAL POST (MISCELLANEOUS) ×3 IMPLANT
COVER SURGICAL LIGHT HANDLE (MISCELLANEOUS) ×3 IMPLANT
DRAPE INCISE IOBAN 66X45 STRL (DRAPES) ×3 IMPLANT
DRAPE STERI IOBAN 125X83 (DRAPES) ×3 IMPLANT
DRAPE SURG 17X23 STRL (DRAPES) ×12 IMPLANT
DRILL 4.3MMS DISTAL GRADUATED (BIT) ×3
DRSG MEPILEX BORDER 4X4 (GAUZE/BANDAGES/DRESSINGS) ×9 IMPLANT
DRSG PAD ABDOMINAL 8X10 ST (GAUZE/BANDAGES/DRESSINGS) ×3 IMPLANT
ELECT REM PT RETURN 9FT ADLT (ELECTROSURGICAL) ×3
ELECTRODE REM PT RTRN 9FT ADLT (ELECTROSURGICAL) ×1 IMPLANT
GLOVE BIO SURGEON STRL SZ7 (GLOVE) ×5 IMPLANT
GLOVE BIO SURGEON STRL SZ7.5 (GLOVE) ×3 IMPLANT
GLOVE BIOGEL PI IND STRL 8 (GLOVE) ×1 IMPLANT
GLOVE BIOGEL PI INDICATOR 8 (GLOVE) ×2
GOWN STRL REUS W/ TWL LRG LVL3 (GOWN DISPOSABLE) ×2 IMPLANT
GOWN STRL REUS W/TWL LRG LVL3 (GOWN DISPOSABLE) ×6
GUIDEPIN 3.2X17.5 THRD DISP (PIN) ×2 IMPLANT
GUIDEWIRE BALL NOSE 80CM (WIRE) ×2 IMPLANT
HFN LAG SCREW 10.5MM X 85MM (Screw) ×2 IMPLANT
HFN RH 130 DEG 9MM X 360MM (Nail) ×2 IMPLANT
KIT ROOM TURNOVER OR (KITS) ×3 IMPLANT
LINER BOOT UNIVERSAL DISP (MISCELLANEOUS) ×3 IMPLANT
MANIFOLD NEPTUNE II (INSTRUMENTS) ×3 IMPLANT
NS IRRIG 1000ML POUR BTL (IV SOLUTION) ×3 IMPLANT
PACK GENERAL/GYN (CUSTOM PROCEDURE TRAY) ×3 IMPLANT
PAD ARMBOARD 7.5X6 YLW CONV (MISCELLANEOUS) ×6 IMPLANT
SCREW BONE CORTICAL 5.0X3 (Screw) ×2 IMPLANT
SPONGE GAUZE 4X4 12PLY STER LF (GAUZE/BANDAGES/DRESSINGS) ×2 IMPLANT
STAPLER VISISTAT 35W (STAPLE) ×5 IMPLANT
SUT VIC AB 1 CT1 27 (SUTURE) ×3
SUT VIC AB 1 CT1 27XBRD ANBCTR (SUTURE) ×1 IMPLANT
SUT VIC AB 2-0 CT1 27 (SUTURE) ×3
SUT VIC AB 2-0 CT1 TAPERPNT 27 (SUTURE) ×1 IMPLANT
TAPE CLOTH SURG 4X10 WHT LF (GAUZE/BANDAGES/DRESSINGS) ×2 IMPLANT
TOWEL OR 17X24 6PK STRL BLUE (TOWEL DISPOSABLE) ×3 IMPLANT
TOWEL OR 17X26 10 PK STRL BLUE (TOWEL DISPOSABLE) ×3 IMPLANT
WATER STERILE IRR 1000ML POUR (IV SOLUTION) ×3 IMPLANT

## 2016-06-13 NOTE — Anesthesia Procedure Notes (Signed)
Procedure Name: Intubation Date/Time: 06/13/2016 12:31 PM Performed by: Rejeana Brock L Pre-anesthesia Checklist: Patient identified, Emergency Drugs available, Suction available and Patient being monitored Patient Re-evaluated:Patient Re-evaluated prior to inductionOxygen Delivery Method: Circle System Utilized Preoxygenation: Pre-oxygenation with 100% oxygen Intubation Type: IV induction Ventilation: Mask ventilation without difficulty Laryngoscope Size: Mac and 4 Grade View: Grade I Tube type: Oral Tube size: 7.0 mm Number of attempts: 1 Airway Equipment and Method: Stylet and Oral airway Placement Confirmation: ETT inserted through vocal cords under direct vision,  positive ETCO2 and breath sounds checked- equal and bilateral Secured at: 21 cm Tube secured with: Tape Dental Injury: Teeth and Oropharynx as per pre-operative assessment

## 2016-06-13 NOTE — Transfer of Care (Signed)
Immediate Anesthesia Transfer of Care Note  Patient: Cindy Meza  Procedure(s) Performed: Procedure(s): INTRAMEDULLARY (IM) NAIL INTERTROCHANTRIC (Right)  Patient Location: PACU  Anesthesia Type:General  Level of Consciousness: awake  Airway & Oxygen Therapy: Patient Spontanous Breathing and Patient connected to nasal cannula oxygen  Post-op Assessment: Report given to RN, Post -op Vital signs reviewed and stable and Patient moving all extremities X 4  Post vital signs: Reviewed and stable  Last Vitals:  Vitals:   06/13/16 0752 06/13/16 1412  BP: 129/75   Pulse: 74   Resp: 20   Temp: 37.3 C 36.2 C    Last Pain:  Vitals:   06/13/16 0738  TempSrc:   PainSc: 3       Patients Stated Pain Goal: 2 ([id number])  Complications: No apparent anesthesia complications

## 2016-06-13 NOTE — Anesthesia Preprocedure Evaluation (Signed)
Anesthesia Evaluation  Patient identified by MRN, date of birth, ID band Patient awake    Reviewed: Allergy & Precautions, NPO status , Patient's Chart, lab work & pertinent test results  Airway Mallampati: II  TM Distance: >3 FB Neck ROM: Full    Dental no notable dental hx.    Pulmonary asthma ,    Pulmonary exam normal breath sounds clear to auscultation       Cardiovascular negative cardio ROS Normal cardiovascular exam Rhythm:Regular Rate:Normal     Neuro/Psych negative neurological ROS  negative psych ROS   GI/Hepatic Neg liver ROS, GERD  ,  Endo/Other  negative endocrine ROS  Renal/GU negative Renal ROS  negative genitourinary   Musculoskeletal negative musculoskeletal ROS (+)   Abdominal   Peds negative pediatric ROS (+)  Hematology negative hematology ROS (+)   Anesthesia Other Findings   Reproductive/Obstetrics negative OB ROS                             Anesthesia Physical Anesthesia Plan  ASA: II  Anesthesia Plan: General   Post-op Pain Management:    Induction: Intravenous  Airway Management Planned: Oral ETT  Additional Equipment:   Intra-op Plan:   Post-operative Plan: Extubation in OR  Informed Consent: I have reviewed the patients History and Physical, chart, labs and discussed the procedure including the risks, benefits and alternatives for the proposed anesthesia with the patient or authorized representative who has indicated his/her understanding and acceptance.   Dental advisory given  Plan Discussed with: CRNA and Surgeon  Anesthesia Plan Comments:         Anesthesia Quick Evaluation

## 2016-06-13 NOTE — Op Note (Signed)
Procedure(s): INTRAMEDULLARY (IM) NAIL INTERTROCHANTRIC Procedure Note  ANNUM BRAUTIGAM female 80 y.o. 06/13/2016  Procedure(s) and Anesthesia Type:    * RIGHT INTRAMEDULLARY (IM) NAIL INTERTROCHANTRIC - General  Surgeon(s) and Role:    * Tania Ade, MD - Primary   Indications:  80 y.o. female s/p fall with right hip fracture. Indicated for surgery to promote early ambulation, pain control and prevent complications of bed rest.     Surgeon: Nita Sells   Assistants: Grier Mitts PA-C   Anesthesia: General endotracheal anesthesia      Procedure Detail  INTRAMEDULLARY (IM) NAIL INTERTROCHANTRIC  Findings: Comminuted 4 part fracture.  Affixus nail 9 x 360.  One distal interlocking screw.  Estimated Blood Loss:  200 mL         Drains: none  Blood Given: none          Specimens: none        Complications:  * No complications entered in OR log *         Disposition: PACU - hemodynamically stable.         Condition: stable    Procedure:  The patient was identified in the preoperative holding area  where I personally marked the operative site after verifying site, side,  and procedure with the patient. She was taken back to the operating  room where general anesthesia was induced without complication. She was  placed on the fracture table with the right lower extremity in traction,  and opposite lower extremity in a flexed abducted position. The arms were well  padded. Fluoroscopic imaging was used to verify reduction with gentle traction  and internal rotation. The right hip was then prepped and draped in the standard sterile fashion. An approximately 3 cm incision was made proximal  to the palpable greater trochanter tip. Dissection was carried down to  the tip and the short guidewire was placed under fluoroscopic imaging.  The proximal entry reamer was used to open the canal and the ball-tipped  guidewire was then placed and advanced down  the femoral canal. This was  used to obtain a measurement for the implant and then was subsequently  over reamed up to 10.5 mm by 0.5 mm increments. The 9x360 mm nail was then  advanced over the guidewire.  There was a tendency for the lateral fracture fragment to abduct with insertion of the nail.  A small percutaneous incision was made, through which a pig sticker was used to hold the  Fracture fragment down while advancing the nail. the proximal jig was then placed and a small 1.5 cm incision was made on the lateral thigh to  advance the lag screw guide against the lateral aspect of the femur.  The guide pin was advanced and its position was verified in AP and  lateral planes to be near centered in the head.  The guidewire was over  reamed and the appropriate size lag screw was advanced. The  proximal set screw was then advanced,locking the fixation in this unstable fracture pattern.  AP and lateral imaging demonstrated appropriate position of  the screw and reduction of the fracture. The proximal jig was then  removed. Attention was turned to the knee. Where using perfect  circles technique on the lateral x-ray, one distal interlocking screw  was placed from lateral to medial through a percutaneous incision.  Final fluoroscopic imaging in AP and lateral planes at the hip and the  knee demonstrated near anatomic reduction with appropriate length and  position of the hardware. All wounds were then copiously irrigated with  normal saline and subsequently closed in layers with #1 Vicryl in a deep  fascia layer, 2-0 Vicryl in a deep dermal layer, and staples for skin  closure. Sterile dressings were then applied including 4x4s and Mepilex  dressings. The patient was then taken off the fracture table,  transferred to the stretcher, and taken to the recovery room in stable  condition after she was extubated.   POSTOPERATIVE PLAN: She will be touchdown weightbearing on the operative extremity.  She will have DVT prophylaxis of aspirin and SCDs.

## 2016-06-13 NOTE — ED Notes (Signed)
Pt refused pain meds at this time. Pt states she is resting comfortably and waiting for transport to [facility] for surgery.

## 2016-06-13 NOTE — Consult Note (Signed)
Reason for Consult:R hip fracture  Referring Physician: EDP  Cindy Meza is an 80 y.o. female.  HPI: 80 year old female who stepped in a hole yesterday while raking leaves in her backyard. Immediate concern ofright hip pain and inability to ambulate.  Found in the emergency department to have a right hip fracture.  Pain is moderate to severe, worse with movement, better with rest.  Past Medical History:  Diagnosis Date  . Asthma    Dr. Melvyn Novas  . Breast cyst    Left  . Cataract    Dr. Gershon Crane  . GERD (gastroesophageal reflux disease)   . Hemorrhoids   . History of colonic polyps [name]   hx of adenomas (small) 1998  . OA (osteoarthritis)   . Osteopenia   . PAC (premature atrial contraction)   . Vitamin D deficiency     Past Surgical History:  Procedure Laterality Date  . ABDOMINAL HYSTERECTOMY     complete  . APPENDECTOMY    . BREAST CYST EXCISION    . COLONOSCOPY    . TONSILLECTOMY      Family History  Problem Relation Age of Onset  . Diabetes Mother 26    Deceased  . Hypertension Other   . Colon cancer Neg Hx   . Pancreatic cancer Neg Hx   . Stomach cancer Neg Hx     Social History:  reports that she has never smoked. She has never used smokeless tobacco. She reports that she drinks alcohol. She reports that she does not use drugs.  Allergies:  Allergies  Allergen Reactions  . Fosamax [Alendronate Sodium]     paresthesias  . Gabapentin     dizziness  . Lidocaine Swelling    Of face and lips.  Novocaine does same thing.  . Risedronate Sodium     Knee pain  . Shellfish Allergy Hives  . Statins     REACTION: pains    Medications: I have reviewed the patient's current medications.  Results for orders placed or performed during the hospital encounter of 06/12/16 (from the past 48 hour(s))  Basic metabolic panel     Status: Abnormal   Collection Time: 06/12/16  9:32 PM  Result Value Ref Range   Sodium 137 135 - 145 mmol/L   Potassium 3.5 3.5 - 5.1  mmol/L   Chloride 102 101 - 111 mmol/L   CO2 27 22 - 32 mmol/L   Glucose, Bld 155 (H) 65 - 99 mg/dL   BUN 18 6 - 20 mg/dL   Creatinine, Ser 0.80 0.44 - 1.00 mg/dL   Calcium 9.0 8.9 - 10.3 mg/dL   GFR calc non Af Amer >60 >60 mL/min   GFR calc Af Amer >60 >60 mL/min    Comment: (NOTE) The eGFR has been calculated using the CKD EPI equation. This calculation has not been validated in all clinical situations. eGFR's persistently <60 mL/min signify possible Chronic Kidney Disease.    Anion gap 8 5 - 15  CBC WITH DIFFERENTIAL     Status: Abnormal   Collection Time: 06/12/16  9:32 PM  Result Value Ref Range   WBC 12.0 (H) 4.0 - 10.5 K/uL   RBC 3.49 (L) 3.87 - 5.11 MIL/uL   Hemoglobin 11.1 (L) 12.0 - 15.0 g/dL   HCT 33.5 (L) 36.0 - 46.0 %   MCV 96.0 78.0 - 100.0 fL   MCH 31.8 26.0 - 34.0 pg   MCHC 33.1 30.0 - 36.0 g/dL   RDW 12.6 11.5 -  15.5 %   Platelets 300 150 - 400 K/uL   Neutrophils Relative % 89 %   Neutro Abs 10.6 (H) 1.7 - 7.7 K/uL   Lymphocytes Relative 6 %   Lymphs Abs 0.7 0.7 - 4.0 K/uL   Monocytes Relative 5 %   Monocytes Absolute 0.6 0.1 - 1.0 K/uL   Eosinophils Relative 0 %   Eosinophils Absolute 0.0 0.0 - 0.7 K/uL   Basophils Relative 0 %   Basophils Absolute 0.0 0.0 - 0.1 K/uL  Protime-INR     Status: None   Collection Time: 06/12/16  9:32 PM  Result Value Ref Range   Prothrombin Time 13.7 11.4 - 15.2 seconds   INR 1.04   Type and screen [place]     Status: None   Collection Time: 06/12/16  9:32 PM  Result Value Ref Range   ABO/RH(D) B POS    Antibody Screen NEG    Sample Expiration 06/15/2016   ABO/Rh     Status: None   Collection Time: 06/12/16  9:32 PM  Result Value Ref Range   ABO/RH(D) B POS     Dg Chest 1 View  Result Date: 06/12/2016 CLINICAL DATA:  80 y/o  F; status post fall. EXAM: CHEST 1 VIEW COMPARISON:  01/16/2006 chest radiograph FINDINGS: Stable cardiac silhouette within normal limits given projection and  technique. Aortic atherosclerosis with arch calcification. Clear lungs. No acute osseous abnormality is evident IMPRESSION: No active disease. Electronically Signed   By: Kristine Garbe M.D.   On: 06/12/2016 22:41   Dg Hip Unilat  With Pelvis 2-3 Views Right  Result Date: 06/12/2016 CLINICAL DATA:  Initial evaluation for acute trauma. EXAM: DG HIP (WITH OR WITHOUT PELVIS) 2-3V RIGHT COMPARISON:  None. FINDINGS: There is an acute mildly displaced intertrochanteric fracture of the right hip. Right femoral head remains normally position within the acetabulum. Femoral head height preserved. Limited views of the left hip demonstrate no acute osseous abnormality. 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 Bony pelvis intact. SI joints approximated. IMPRESSION: Acute mildly displaced intertrochanteric fracture of the right hip. Electronically Signed   By: Jeannine Boga M.D.   On: 06/12/2016 21:11   Dg Femur 1v Right  Result Date: 06/12/2016 CLINICAL DATA:  80 y/o  F; hip fracture EXAM: RIGHT FEMUR 1 VIEW COMPARISON:  None. FINDINGS: Partially visualized displaced and angulated intertrochanteric fracture of the right hip. The femoral shaft is otherwise intact. The visualized knee joint is grossly maintained. IMPRESSION: Partially visualized displaced and angulated intertrochanteric fracture of the right hip. The femoral shaft is otherwise intact. Electronically Signed   By: Kristine Garbe M.D.   On: 06/12/2016 22:43    Review of Systems  All other systems reviewed and are negative.  Blood pressure 129/75, pulse 74, temperature 99.2 F (37.3 C), temperature source Oral, resp. rate 22, SpO2 100 %. Physical Exam  Constitutional: She is oriented to person, place, and time. She appears well-developed and well-nourished.  HENT:  Head: Atraumatic.  Eyes: EOM are normal.  Cardiovascular: Intact distal pulses.   Respiratory: Effort normal.  Musculoskeletal:  R  LE shortened and externally rotated. NVID. Bilat UES atraumatic, no TTP.  Neurological: She is alert and oriented to person, place, and time.  Skin: Skin is warm and dry.  Psychiatric: She has a normal mood and affect.    Assessment/Plan: Right intertrochanteric hip fracture Plan right hip trochanteric nail today Risks / benefits of surgery discussed Consent on chart  NPO  for OR Preop antibiotics   Ryna Beckstrom WILLIAM 06/13/2016, 7:50 AM

## 2016-06-13 NOTE — Progress Notes (Signed)
PROGRESS NOTE    Cindy Meza  [id number] DOB: 09/27/1935 DOA: 06/12/2016 PCP: [name], MD   Brief Narrative:  Cindy Meza is a 80 y.o. female brought to ED by EMS after falling in the backyard, X rays show R intertrochanteric displaced hip fracture. Transferred to [facility] for OR, by Dr Tamera Punt.   Assessment & Plan:   Principal Problem:   Closed intertrochanteric fracture of hip, right, initial encounter [facility]) Active Problems:   GERD (gastroesophageal reflux disease)   Osteoporosis   Fall   Closed right hip fracture (HCC)   Closed intertrochanteric fracture of hip, right,; Admitted AND [facility].  Pain control and start PT in am.    GERD: stable.   Leukocytosis: suspect reactive.  CXR no evidence of infection.  No urinary symptoms.  Get UA.  Repeat CBC in am.   Mild normocytic anemia: hemoglobin around 11.  Recheck hemoglobin in am.  Look for anemia of blood loss from surgery.    Mild hyperglycemia: monitor.       DVT prophylaxis: (Lovenox) Code Status: (Full) Family Communication: none at bedside.  Disposition Plan: pending further eval.    Consultants:  Orthopedics.    Procedures: hip  Repair.    Antimicrobials: none.    Subjective: No new complaints. Pain well controlled.   Objective: Vitals:   06/13/16 1511 06/13/16 1515 06/13/16 1520 06/13/16 1605  BP: (!) 100/56  108/67 (!) 101/59  Pulse:    78  Resp: 13 13 13 15   Temp:   98.2 F (36.8 C) 97.3 F (36.3 C)  TempSrc:    Oral  SpO2:    98%    Intake/Output Summary (Last 24 hours) at 06/13/16 1717 Last data filed at 06/13/16 1518  Gross per 24 hour  Intake             1350 ml  Output              250 ml  Net             1100 ml   There were no vitals filed for this visit.  Examination:  General exam: Appears calm and comfortable  Respiratory system: Clear to auscultation. Respiratory effort normal. Cardiovascular system: S1 & S2 heard,  RRR. No JVD, murmurs, rubs, gallops or clicks. No pedal edema. Gastrointestinal system: Abdomen is nondistended, soft and nontender. No organomegaly or masses felt. Normal bowel sounds heard. Central nervous system: Alert and oriented. No focal neurological deficits. Extremities: Symmetric 5 x 5 power. Skin: No rashes, lesions or ulcers Psychiatry: Judgement and insight appear normal. Mood & affect appropriate.     Data Reviewed: I have personally reviewed following labs and imaging studies  CBC:  Recent Labs Lab 06/12/16 2132  WBC 12.0*  NEUTROABS 10.6*  HGB 11.1*  HCT 33.5*  MCV 96.0  PLT [id number]   Basic Metabolic Panel:  Recent Labs Lab 06/12/16 2132  NA 137  K 3.5  CL 102  CO2 27  GLUCOSE 155*  BUN 18  CREATININE 0.80  CALCIUM 9.0   GFR: CrCl cannot be calculated (Unknown ideal weight.). Liver Function Tests: No results for input(s): AST, ALT, ALKPHOS, BILITOT, PROT, ALBUMIN in the last 168 hours. No results for input(s): LIPASE, AMYLASE in the last 168 hours. No results for input(s): AMMONIA in the last 168 hours. Coagulation Profile:  Recent Labs Lab 06/12/16 2132  INR 1.04   Cardiac Enzymes: No results for input(s): CKTOTAL, CKMB, CKMBINDEX,  TROPONINI in the last 168 hours. BNP (last 3 results) No results for input(s): PROBNP in the last 8760 hours. HbA1C: No results for input(s): HGBA1C in the last 72 hours. CBG: No results for input(s): GLUCAP in the last 168 hours. Lipid Profile: No results for input(s): CHOL, HDL, LDLCALC, TRIG, CHOLHDL, LDLDIRECT in the last 72 hours. Thyroid Function Tests: No results for input(s): TSH, T4TOTAL, FREET4, T3FREE, THYROIDAB in the last 72 hours. Anemia Panel: No results for input(s): VITAMINB12, FOLATE, FERRITIN, TIBC, IRON, RETICCTPCT in the last 72 hours. Sepsis Labs: No results for input(s): PROCALCITON, LATICACIDVEN in the last 168 hours.  Recent Results (from the past 240 hour(s))  Surgical pcr screen      Status: None   Collection Time: 06/13/16 10:11 AM  Result Value Ref Range Status   MRSA, PCR NEGATIVE NEGATIVE Final   Staphylococcus aureus NEGATIVE NEGATIVE Final    Comment:        The Xpert SA Assay (FDA approved for NASAL specimens in patients over 59 years of age), is one component of a comprehensive surveillance program.  Test performance has been validated by [facility] for patients greater than or equal to 76 year old. It is not intended to diagnose infection nor to guide or monitor treatment.          Radiology Studies: Dg Chest 1 View  Result Date: 06/12/2016 CLINICAL DATA:  80 y/o  F; status post fall. EXAM: CHEST 1 VIEW COMPARISON:  01/16/2006 chest radiograph FINDINGS: Stable cardiac silhouette within normal limits given projection and technique. Aortic atherosclerosis with arch calcification. Clear lungs. No acute osseous abnormality is evident IMPRESSION: No active disease. Electronically Signed   By: Kristine Garbe M.D.   On: 06/12/2016 22:41   Dg C-arm 1-60 Min  Result Date: 06/13/2016 CLINICAL DATA:  80 year old female undergoing right femur ORIF. Initial encounter. EXAM: DG C-ARM 61-120 MIN; RIGHT FEMUR 2 VIEWS COMPARISON:  Right femur series 12517. FINDINGS: 4 intraoperative fluoroscopic spot views of the right femur done demonstrating intra medullary rod placement with proximal interlocking dynamic hip screw and distal interlocking cortical screw. Improved alignment about the comminuted intertrochanteric fracture. Hardware appears intact. FLUOROSCOPY TIME:  2 minutes 22 seconds IMPRESSION: Right femur ORIF with improved intertrochanteric fracture alignment. Electronically Signed   By: Genevie Ann M.D.   On: 06/13/2016 14:06   Dg Hip Unilat  With Pelvis 2-3 Views Right  Result Date: 06/12/2016 CLINICAL DATA:  Initial evaluation for acute trauma. EXAM: DG HIP (WITH OR WITHOUT PELVIS) 2-3V RIGHT COMPARISON:  None. FINDINGS: There is an acute mildly  displaced intertrochanteric fracture of the right hip. Right femoral head remains normally position within the acetabulum. Femoral head height preserved. Limited views of the left hip demonstrate no acute osseous abnormality. 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 Bony pelvis intact. SI joints approximated. IMPRESSION: Acute mildly displaced intertrochanteric fracture of the right hip. Electronically Signed   By: Jeannine Boga M.D.   On: 06/12/2016 21:11   Dg Femur 1v Right  Result Date: 06/12/2016 CLINICAL DATA:  80 y/o  F; hip fracture EXAM: RIGHT FEMUR 1 VIEW COMPARISON:  None. FINDINGS: Partially visualized displaced and angulated intertrochanteric fracture of the right hip. The femoral shaft is otherwise intact. The visualized knee joint is grossly maintained. IMPRESSION: Partially visualized displaced and angulated intertrochanteric fracture of the right hip. The femoral shaft is otherwise intact. Electronically Signed   By: Kristine Garbe M.D.   On: 06/12/2016 22:43   Dg Femur,  Min 2 Views Right  Result Date: 06/13/2016 CLINICAL DATA:  80 year old female undergoing right femur ORIF. Initial encounter. EXAM: DG C-ARM 61-120 MIN; RIGHT FEMUR 2 VIEWS COMPARISON:  Right femur series 12517. FINDINGS: 4 intraoperative fluoroscopic spot views of the right femur done demonstrating intra medullary rod placement with proximal interlocking dynamic hip screw and distal interlocking cortical screw. Improved alignment about the comminuted intertrochanteric fracture. Hardware appears intact. FLUOROSCOPY TIME:  2 minutes 22 seconds IMPRESSION: Right femur ORIF with improved intertrochanteric fracture alignment. Electronically Signed   By: Genevie Ann M.D.   On: 06/13/2016 14:06   Dg Femur Port, Min 2 Views Right  Result Date: 06/13/2016 CLINICAL DATA:  IM nail. EXAM: RIGHT FEMUR PORTABLE 2 VIEW COMPARISON:  06/12/2016 FINDINGS: Internal fixation across the comminuted right  femoral intertrochanteric fracture. Fracture fragments remain mildly displaced. Decreasing varus angulation. No hardware complicating feature. IMPRESSION: Internal fixation across the comminuted right femoral intertrochanteric fracture with continued mild displacement of fracture fragments. Electronically Signed   By: Rolm Baptise M.D.   On: 06/13/2016 15:08        Scheduled Meds: . [START ON 06/14/2016] aspirin EC  325 mg Oral Q breakfast  .  ceFAZolin (ANCEF) IV  2 g Intravenous Q6H  . cholecalciferol  1,000 Units Oral Daily  . famotidine  20 mg Oral BID  . HYDROmorphone      . mometasone-formoterol  2 puff Inhalation BID  . montelukast  10 mg Oral QHS   Continuous Infusions: . sodium chloride    . dextrose 5 % and 0.45 % NaCl with KCl 20 mEq/L       LOS: 1 day    Time spent: 25 minutes.     Hosie Poisson, MD [facility] Pager [phone]   If 7PM-7AM, please contact night-coverage [email or web address] Password [email or web address] 06/13/2016, 5:17 PM

## 2016-06-13 NOTE — ED Notes (Addendum)
Spoke with Dr. Karleen Hampshire and she will complete [employer] EMTALA form.

## 2016-06-13 NOTE — ED Notes (Signed)
Pt NPO since 23:00 06/12/2016

## 2016-06-14 ENCOUNTER — Encounter (HOSPITAL_COMMUNITY): Payer: Self-pay | Admitting: Orthopedic Surgery

## 2016-06-14 DIAGNOSIS — W19XXXS Unspecified fall, sequela: Secondary | ICD-10-CM

## 2016-06-14 DIAGNOSIS — S72141A Displaced intertrochanteric fracture of right femur, initial encounter for closed fracture: Principal | ICD-10-CM

## 2016-06-14 DIAGNOSIS — D62 Acute posthemorrhagic anemia: Secondary | ICD-10-CM

## 2016-06-14 DIAGNOSIS — D72829 Elevated white blood cell count, unspecified: Secondary | ICD-10-CM

## 2016-06-14 LAB — BASIC METABOLIC PANEL
ANION GAP: 7 (ref 5–15)
BUN: 11 mg/dL (ref 6–20)
CALCIUM: 7.9 mg/dL — AB (ref 8.9–10.3)
CHLORIDE: 105 mmol/L (ref 101–111)
CO2: 25 mmol/L (ref 22–32)
CREATININE: 0.81 mg/dL (ref 0.44–1.00)
GFR calc non Af Amer: >60 mL/min (ref >60)
Glucose, Bld: 116 mg/dL — ABNORMAL HIGH (ref 65–99)
Potassium: 4 mmol/L (ref 3.5–5.1)
SODIUM: 137 mmol/L (ref 135–145)

## 2016-06-14 LAB — CBC
HCT: 19.5 % — ABNORMAL LOW (ref 36.0–46.0)
HEMOGLOBIN: 6.4 g/dL — AB (ref 12.0–15.0)
MCH: 31.1 pg (ref 26.0–34.0)
MCHC: 32.8 g/dL (ref 30.0–36.0)
MCV: 94.7 fL (ref 78.0–100.0)
Platelets: 184 10*3/uL (ref 150–400)
RBC: 2.06 MIL/uL — ABNORMAL LOW (ref 3.87–5.11)
RDW: 12.6 % (ref 11.5–15.5)
WBC: 6.8 10*3/uL (ref 4.0–10.5)

## 2016-06-14 LAB — HEMOGLOBIN AND HEMATOCRIT, BLOOD
HCT: 18.6 % — ABNORMAL LOW (ref 36.0–46.0)
HCT: 29.5 % — ABNORMAL LOW (ref 36.0–46.0)
HEMOGLOBIN: 10 g/dL — AB (ref 12.0–15.0)
Hemoglobin: 6.3 g/dL — CL (ref 12.0–15.0)

## 2016-06-14 LAB — ABO/RH: ABO/RH(D): B POS

## 2016-06-14 LAB — PREPARE RBC (CROSSMATCH)

## 2016-06-14 MED ORDER — SODIUM CHLORIDE 0.9 % IV SOLN
Freq: Once | INTRAVENOUS | Status: AC
  Administered 2016-06-14: 09:00:00 via INTRAVENOUS

## 2016-06-14 NOTE — Anesthesia Postprocedure Evaluation (Signed)
Anesthesia Post Note  Patient: Cindy Meza  Procedure(s) Performed: Procedure(s) (LRB): INTRAMEDULLARY (IM) NAIL INTERTROCHANTRIC (Right)  Patient location during evaluation: PACU Anesthesia Type: General Level of consciousness: awake and alert Pain management: pain level controlled Vital Signs Assessment: post-procedure vital signs reviewed and stable Respiratory status: spontaneous breathing, nonlabored ventilation, respiratory function stable and patient connected to nasal cannula oxygen Cardiovascular status: blood pressure returned to baseline and stable Postop Assessment: no signs of nausea or vomiting Anesthetic complications: no    Last Vitals:  Vitals:   06/13/16 2308 06/14/16 0438  BP:  122/64  Pulse:  85  Resp:  16  Temp: 36.7 C 37.1 C    Last Pain:  Vitals:   06/14/16 0438  TempSrc: Oral  PainSc:                  Jovan Schickling S

## 2016-06-14 NOTE — Progress Notes (Signed)
Patient ID: Cindy Meza, female   DOB: 03-17-1936, 80 y.o.   MRN: [id number]  PROGRESS NOTE    Cindy Meza  [id number] DOB: 07/25/1935 DOA: 06/12/2016  PCP: [name], MD   Brief Narrative:  80 y.o.femalewho presented status post fall at home. X rays showed right intertrochanteric displaced hip fracture. Transferred to [facility] for OR, by Dr Tamera Punt. She underwent right intramedullary nail 12/6.   Assessment & Plan:   Closed intertrochanteric fracture of hip, right / fall  - S/P right intermedullary nail 12/6 - Continue aspirin  - PT eval  - Appreciate ortho following   Acute postoperative blood loss anemia - Hgb 6.3 this am - Will give 2 U PRBC today - Check CBC in am  Leukocytosis - Suspect reactive - Subsequently resolved  - CXR no evidence of infection.    DVT prophylaxis: SCD's bilaterally due to drop in hgb  Code Status: full code  Family Communication: husband at the bedside this am Disposition Plan: to SNF once cleared by ortho    Consultants:   Orthopedic surgery   Procedures:   RIGHT INTRAMEDULLARY (IM) NAIL INTERTROCHANTERIC 06/13/2016   Antimicrobials:   Preop cefazolin 12/6    Subjective: No overnight events.  Objective: Vitals:   06/13/16 2127 06/13/16 2308 06/14/16 0438 06/14/16 0946  BP: 110/65  122/64   Pulse: (!) 105  85   Resp: 14  16   Temp: (!) 100.5 F (38.1 C) 98 F (36.7 C) 98.8 F (37.1 C)   TempSrc: Oral  Oral   SpO2: 97%  100% 96%    Intake/Output Summary (Last 24 hours) at 06/14/16 1100 Last data filed at 06/13/16 1700  Gross per 24 hour  Intake             1470 ml  Output              250 ml  Net             1220 ml   There were no vitals filed for this visit.  Examination:  General exam: Appears calm and comfortable  Respiratory system: Clear to auscultation. Respiratory effort normal. Cardiovascular system: S1 & S2 heard, RRR. No pedal edema. Gastrointestinal system: Abdomen is nondistended,  soft and nontender. No organomegaly or masses felt. Normal bowel sounds heard. Central nervous system: Alert and oriented. No focal neurological deficits. Extremities: Symmetric 5 x 5 power. Skin: No rashes, lesions or ulcers Psychiatry: Judgement and insight appear normal. Mood & affect appropriate.   Data Reviewed: I have personally reviewed following labs and imaging studies  CBC:  Recent Labs Lab 06/12/16 2132 06/14/16 0429 06/14/16 0823  WBC 12.0* 6.8  --   NEUTROABS 10.6*  --   --   HGB 11.1* 6.4* 6.3*  HCT 33.5* 19.5* 18.6*  MCV 96.0 94.7  --   PLT 300 184  --    Basic Metabolic Panel:  Recent Labs Lab 06/12/16 2132 06/14/16 0429  NA 137 137  K 3.5 4.0  CL 102 105  CO2 27 25  GLUCOSE 155* 116*  BUN 18 11  CREATININE 0.80 0.81  CALCIUM 9.0 7.9*   GFR: CrCl cannot be calculated (Unknown ideal weight.). Liver Function Tests: No results for input(s): AST, ALT, ALKPHOS, BILITOT, PROT, ALBUMIN in the last 168 hours. No results for input(s): LIPASE, AMYLASE in the last 168 hours. No results for input(s): AMMONIA in the last 168 hours. Coagulation Profile:  Recent Labs Lab 06/12/16 2132  INR  1.04   Cardiac Enzymes: No results for input(s): CKTOTAL, CKMB, CKMBINDEX, TROPONINI in the last 168 hours. BNP (last 3 results) No results for input(s): PROBNP in the last 8760 hours. HbA1C: No results for input(s): HGBA1C in the last 72 hours. CBG: No results for input(s): GLUCAP in the last 168 hours. Lipid Profile: No results for input(s): CHOL, HDL, LDLCALC, TRIG, CHOLHDL, LDLDIRECT in the last 72 hours. Thyroid Function Tests: No results for input(s): TSH, T4TOTAL, FREET4, T3FREE, THYROIDAB in the last 72 hours. Anemia Panel: No results for input(s): VITAMINB12, FOLATE, FERRITIN, TIBC, IRON, RETICCTPCT in the last 72 hours. Urine analysis:    Component Value Date/Time   COLORURINE YELLOW 02/15/2016 [place] 02/15/2016 1203   LABSPEC  <=1.005 (A) 02/15/2016 1203   PHURINE 6.0 02/15/2016 1203   GLUCOSEU NEGATIVE 02/15/2016 1203   HGBUR NEGATIVE 02/15/2016 [place] 02/15/2016 1203   BILIRUBINUR neg 02/10/2015 1101   KETONESUR NEGATIVE 02/15/2016 1203   PROTEINUR neg 02/10/2015 1101   UROBILINOGEN 0.2 02/15/2016 1203   NITRITE NEGATIVE 02/15/2016 1203   LEUKOCYTESUR NEGATIVE 02/15/2016 1203   Sepsis Labs: @LABRCNTIP (procalcitonin:4,lacticidven:4)    Surgical pcr screen     Status: None   Collection Time: 06/13/16 10:11 AM  Result Value Ref Range Status   MRSA, PCR NEGATIVE NEGATIVE Final   Staphylococcus aureus NEGATIVE NEGATIVE Final      Radiology Studies: Dg Chest 1 View Result Date: 06/12/2016 No active disease. Electronically Signed   By: Kristine Garbe M.D.   On: 06/12/2016 22:41   Dg C-arm 1-60 Min Result Date: 06/13/2016 Right femur ORIF with improved intertrochanteric fracture alignment. Electronically Signed   By: Genevie Ann M.D.   On: 06/13/2016 14:06   Dg Hip Unilat  With Pelvis 2-3 Views Right Result Date: 06/12/2016 Acute mildly displaced intertrochanteric fracture of the right hip. Electronically Signed   By: Jeannine Boga M.D.   On: 06/12/2016 21:11   Dg Femur 1v Right Result Date: 06/12/2016 Partially visualized displaced and angulated intertrochanteric fracture of the right hip. The femoral shaft is otherwise intact. Electronically Signed   By: Kristine Garbe M.D.   On: 06/12/2016 22:43   Dg Femur, Min 2 Views Right Result Date: 06/13/2016 Right femur ORIF with improved intertrochanteric fracture alignment. Electronically Signed   By: Genevie Ann M.D.   On: 06/13/2016 14:06   Dg Femur Port, Min 2 Views Right Result Date: 06/13/2016  Internal fixation across the comminuted right femoral intertrochanteric fracture with continued mild displacement of fracture fragments. Electronically Signed   By: Rolm Baptise M.D.   On: 06/13/2016 15:08       Scheduled Meds: . sodium chloride   Intravenous Once  . aspirin EC  325 mg Oral Q breakfast  . cholecalciferol  1,000 Units Oral Daily  . famotidine  20 mg Oral BID  . mometasone-formoterol  2 puff Inhalation BID  . montelukast  10 mg Oral QHS   Continuous Infusions: . sodium chloride 100 mL/hr at 06/14/16 0556  . dextrose 5 % and 0.45 % NaCl with KCl 20 mEq/L       LOS: 2 days    Time spent: 25 minutes  Greater than 50% of the time spent on counseling and coordinating the care.   Leisa Lenz, MD [facility] Pager [phone]  If 7PM-7AM, please contact night-coverage [email or web address] Password [email or web address] 06/14/2016, 11:00 AM

## 2016-06-14 NOTE — Progress Notes (Signed)
PT Cancellation Note  Patient Details Name: BETTYLOU WINTERROWD MRN: [id number] DOB: 10/04/1935   Cancelled Treatment:    Reason Eval/Treat Not Completed: Medical issues which prohibited therapy. Pt HGB is 6.4 and has to receive 2 units of blood. Pt requesting PT check back this Pm.    Scheryl Marten PT, DPT  [phone]  06/14/2016, 12:24 PM

## 2016-06-14 NOTE — Evaluation (Signed)
Physical Therapy Evaluation Patient Details Name: Cindy Meza MRN: [id number] DOB: January 02, 1936 Today's Date: 06/14/2016   History of Present Illness  80 yo female admitted via ED on 06/12/16 following fall stepping into hole while raking leaves. Pt was found to have R hip fx and underwent IM nail on 06/13/16. PMH significant for Asthma, Osteopenia, OA.    Clinical Impression  Pt is POD 1 and moving slowly with therapy following the above. Pt has low HGB this AM and is in the process of starting second unit of blood. Prior to admission, pt was living alone and completely independent including driving and performing own errands. Discussed options with pt and it was decided that with her living situation and weight bearing status, SNF placement immediately following DC would be her best option at this time in order to achieve maximum outcomes. Pt also continues to benefit from being seen acutely in order to address below deficits to assist with return to baseline.     Follow Up Recommendations SNF    Equipment Recommendations  Rolling walker with 5" wheels;3in1 (PT);Other (comment) (or defer to next venue of care)    Recommendations for Other Services       Precautions / Restrictions Precautions Precautions: Fall Restrictions Weight Bearing Restrictions: Yes RLE Weight Bearing: Touchdown weight bearing      Mobility  Bed Mobility Overal bed mobility: Needs Assistance Bed Mobility: Supine to Sit     Supine to sit: Mod assist     General bed mobility comments: Mod A to bring RLE EOB and to position self at EOB  Transfers Overall transfer level: Needs assistance Equipment used: Rolling walker (2 wheeled) Transfers: Sit to/from [facility] Sit to Stand: Mod assist Stand pivot transfers: Mod assist;+2 physical assistance;+2 safety/equipment       General transfer comment: Mod A for sit to stand with cues for TDWB RLE. Mod A x2 for stand pivot trasnfer to  maintain weight bearing restrictions and to maneuver IV pole  Ambulation/Gait                Stairs            Wheelchair Mobility    Modified Rankin (Stroke Patients Only)       Balance Overall balance assessment: Needs assistance;History of Falls Sitting-balance support: Feet supported;Single extremity supported Sitting balance-Leahy Scale: Fair     Standing balance support: Bilateral upper extremity supported Standing balance-Leahy Scale: Poor Standing balance comment: Relies on Rw and requires assistance to maneuver with RW                             Pertinent Vitals/Pain Pain Assessment: Faces Faces Pain Scale: Hurts little more Pain Descriptors / Indicators: Guarding;Grimacing Pain Intervention(s): Monitored during session;Premedicated before session;Ice applied;Repositioned    Home Living Family/patient expects to be discharged to:: Private residence Living Arrangements: Alone Available Help at Discharge: Family;Friend(s) Type of Home: House Home Access: Stairs to enter Entrance Stairs-Rails: None Entrance Stairs-Number of Steps: 4 Home Layout: One level;Other (Comment) (1 step from main living area to kitchen') Home Equipment: None      Prior Function Level of Independence: Independent         Comments: Completely independent with everything before fall with fx     Hand Dominance   Dominant Hand: Right    Extremity/Trunk Assessment   Upper Extremity Assessment: Defer to OT evaluation  Lower Extremity Assessment: RLE deficits/detail RLE Deficits / Details: pt with normal post op pain and weakness. at least 3/5 ankle, 2/5 knee and hip per gross functional assessment       Communication   Communication: No difficulties  Cognition Arousal/Alertness: Awake/alert Behavior During Therapy: WFL for tasks assessed/performed Overall Cognitive Status: Within Functional Limits for tasks assessed                       General Comments      Exercises Total Joint Exercises Ankle Circles/Pumps: AROM;20 reps;Supine;Both   Assessment/Plan    PT Assessment Patient needs continued PT services  PT Problem List Decreased strength;Decreased range of motion;Decreased activity tolerance;Decreased balance;Decreased mobility;Decreased knowledge of use of DME;Pain          PT Treatment Interventions DME instruction;Gait training;Functional mobility training;Therapeutic activities;Therapeutic exercise;Balance training;Patient/family education    PT Goals (Current goals can be found in the Care Plan section)  Acute Rehab PT Goals Patient Stated Goal: to get back to normal  PT Goal Formulation: With patient Time For Goal Achievement: 06/21/16 Potential to Achieve Goals: Good    Frequency Min 3X/week   Barriers to discharge Decreased caregiver support Lives alone and will need 24hr supervision upon DC    Co-evaluation               End of Session Equipment Utilized During Treatment: Gait belt Activity Tolerance: Patient tolerated treatment well;Patient limited by pain Patient left: in chair;with call bell/phone within reach;with nursing/sitter in room;with family/visitor present Nurse Communication: Mobility status         Time: 1455-1529 PT Time Calculation (min) (ACUTE ONLY): 34 min   Charges:   PT Evaluation $PT Eval Moderate Complexity: 1 Procedure PT Treatments $Therapeutic Activity: 8-22 mins   PT G Codes:        Scheryl Marten PT, DPT  [phone]  06/14/2016, 3:45 PM

## 2016-06-14 NOTE — Progress Notes (Signed)
CRITICAL VALUE ALERT  Critical value received: Hemoglobin 6.4  Date of notification:  06/14/16  Time of notification:  0750  Critical value read back: yes  Nurse who received alert:  Alyse Low  MD notified (1st page):  Karleen Hampshire   Time of first page:  0800  MD notified (2nd page):  Time of second page:  Responding MD:   Karleen Hampshire   Time MD responded:  [phone]

## 2016-06-14 NOTE — Progress Notes (Signed)
   PATIENT ID: Cindy Meza   1 Day Post-Op Procedure(s) (LRB): INTRAMEDULLARY (IM) NAIL INTERTROCHANTRIC (Right)  Subjective: Doing well, sitting up in bed. No significant pain in right hip.   Objective:  Vitals:   06/13/16 2308 06/14/16 0438  BP:  122/64  Pulse:  85  Resp:  16  Temp: 98 F (36.7 C) 98.8 F (37.1 C)     R hip dressing c/d/i Wiggles toes, distally NVI  Labs:   Recent Labs  06/12/16 2132 06/14/16 0429 06/14/16 0823  HGB 11.1* 6.4* 6.3*   Recent Labs  06/12/16 2132 06/14/16 0429 06/14/16 0823  WBC 12.0* 6.8  --   RBC 3.49* 2.06*  --   HCT 33.5* 19.5* 18.6*  PLT 300 184  --    Recent Labs  06/12/16 2132 06/14/16 0429  NA 137 137  K 3.5 4.0  CL 102 105  CO2 27 25  BUN 18 11  CREATININE 0.80 0.81  GLUCOSE 155* 116*  CALCIUM 9.0 7.9*    Assessment and Plan: 1 day s/p right IM femoral nail for fx Touch down weightbearing with PT ABLA- expected post op, primary team ordered 2 units packed RBC this am, awaiting to receive blood, will continue to monitor Pain level is low- try to minimize IV narcotics and transition of norco po only ASA 325 daily for DVT prophylaxis Needs SCDs on  We appreciate medicine team's help in care for this patient

## 2016-06-14 NOTE — Consult Note (Signed)
[facility] CM Primary Care Navigator  06/14/2016  Cindy Meza [birth year] [id number]   Met with patient and family at the bedside to identify possible discharge needs. Patient requests to come back at another time since she is in the middle of blood transfusion.  RN was made aware and notified to call primary care navigator back when patient is available.   For additional questions please contact:  Edwena Felty A. Tytiana Coles, BSN, RN-BC [facility] PRIMARY CARE Navigator Cell: [phone]

## 2016-06-14 NOTE — Progress Notes (Signed)
OT Cancellation Note  Patient Details Name: Cindy Meza MRN: [id number] DOB: 1936-07-04   Cancelled Treatment:    Reason Eval/Treat Not Completed: Medical issues which prohibited therapy. Pt with hemoglobin 6.4 and waiting to receive 2 units of blood. Pt requesting all therapies to wait until blood given. OT will check back as able.  [address], OTR/L [id number] 06/14/2016, 1:41 PM

## 2016-06-15 LAB — BASIC METABOLIC PANEL
Anion gap: 10 (ref 5–15)
BUN: 7 mg/dL (ref 6–20)
CALCIUM: 8.3 mg/dL — AB (ref 8.9–10.3)
CHLORIDE: 105 mmol/L (ref 101–111)
CO2: 24 mmol/L (ref 22–32)
CREATININE: 0.69 mg/dL (ref 0.44–1.00)
Glucose, Bld: 108 mg/dL — ABNORMAL HIGH (ref 65–99)
Potassium: 3.3 mmol/L — ABNORMAL LOW (ref 3.5–5.1)
SODIUM: 139 mmol/L (ref 135–145)

## 2016-06-15 LAB — TYPE AND SCREEN
Blood Product Expiration Date: 201712132359
Blood Product Expiration Date: 201712162359
ISSUE DATE / TIME: 201712071210
ISSUE DATE / TIME: 201712071511
UNIT TYPE AND RH: 7300
Unit Type and Rh: 7300

## 2016-06-15 LAB — CBC
HCT: 27.7 % — ABNORMAL LOW (ref 36.0–46.0)
HEMOGLOBIN: 9.4 g/dL — AB (ref 12.0–15.0)
MCH: 29.7 pg (ref 26.0–34.0)
MCHC: 33.9 g/dL (ref 30.0–36.0)
MCV: 87.4 fL (ref 78.0–100.0)
PLATELETS: 141 10*3/uL — AB (ref 150–400)
RBC: 3.17 MIL/uL — ABNORMAL LOW (ref 3.87–5.11)
RDW: 17.9 % — AB (ref 11.5–15.5)
WBC: 8.3 10*3/uL (ref 4.0–10.5)

## 2016-06-15 MED ORDER — HYDROCODONE-ACETAMINOPHEN 5-325 MG PO TABS: 1.0000 | ORAL_TABLET | Freq: Four times a day (QID) | ORAL | 0 refills | Status: DC | PRN

## 2016-06-15 MED ORDER — POTASSIUM CHLORIDE CRYS ER 20 MEQ PO TBCR
40.0000 meq | EXTENDED_RELEASE_TABLET | Freq: Once | ORAL | Status: AC
  Administered 2016-06-15: 40 meq via ORAL
  Filled 2016-06-15: qty 2

## 2016-06-15 MED ORDER — ASPIRIN 325 MG PO TBEC: 325.0000 mg | DELAYED_RELEASE_TABLET | Freq: Every day | ORAL | 0 refills | Status: DC

## 2016-06-15 NOTE — Evaluation (Signed)
Occupational Therapy Evaluation Patient Details Name: Cindy Meza MRN: [id number] DOB: 06-Jul-1936 Today's Date: 06/15/2016    History of Present Illness 80 yo female admitted via ED on 06/12/16 following fall stepping into hole while raking leaves. Pt was found to have R hip fx and underwent IM nail on 06/13/16. PMH significant for Asthma, Osteopenia, OA.     Clinical Impression   PTA, pt was independent with ADL and functional mobility. Pt currently requires max assist for LB ADL and mod assist +2 for functional mobility. Pt able to transfer to bed side commode with stand-pivot transfer with mod assist +2 this session. Pt demonstrates some difficulty maintaining TWB status with RLE and requires consistent VC's.   Pt lives alone and plans to D/C to SNF for continued rehabilitation prior to returning home. Feel pt would strongly benefit from continued OT services through short-term SNF placement to improve independence with ADL and functional mobility. All further OT needs can be met at next venue of care. Acute OT will sign off.   Follow Up Recommendations  SNF;Supervision/Assistance - 24 hour    Equipment Recommendations  Other (comment) (TBD at next venue of care)    Recommendations for Other Services       Precautions / Restrictions Precautions Precautions: Fall Restrictions Weight Bearing Restrictions: Yes RLE Weight Bearing: Touchdown weight bearing      Mobility Bed Mobility Overal bed mobility: Needs Assistance Bed Mobility: Supine to Sit     Supine to sit: Mod assist;HOB elevated     General bed mobility comments: assist to bring bilat LE to EOB and to elevate trunk into sitting; cues for sequencing  Transfers Overall transfer level: Needs assistance Equipment used: Rolling walker (2 wheeled) Transfers: Sit to/from [facility] Sit to Stand: Mod assist;+2 physical assistance Stand pivot transfers: Mod assist;+2 physical assistance;+2  safety/equipment       General transfer comment: cues for hand placement, technique, and WB status; pt unable to maintain TDWB and required +2 to pivot to recliner    Balance Overall balance assessment: Needs assistance;History of Falls Sitting-balance support: Feet supported;Bilateral upper extremity supported Sitting balance-Leahy Scale: Fair     Standing balance support: Bilateral upper extremity supported Standing balance-Leahy Scale: Poor Standing balance comment: Relies on Rw and requires assistance to maneuver with RW                            ADL Overall ADL's : Needs assistance/impaired Eating/Feeding: Set up;Sitting   Grooming: Set up;Sitting   Upper Body Bathing: Set up;Sitting   Lower Body Bathing: Maximal assistance;Sit to/from stand   Upper Body Dressing : Set up;Sitting   Lower Body Dressing: Maximal assistance;Sit to/from stand   Toilet Transfer: Moderate assistance;+2 for safety/equipment;Stand-pivot;Ambulation;BSC   Toileting- Clothing Manipulation and Hygiene: Moderate assistance;+2 for physical assistance;Sit to/from stand       Functional mobility during ADLs: Moderate assistance;+2 for physical assistance;Rolling walker General ADL Comments: Educated pt on safety with ADL post-operatively and while adhering to TWB precautions.     Vision Vision Assessment?: No apparent visual deficits   Perception     Praxis      Pertinent Vitals/Pain Pain Assessment: Faces Faces Pain Scale: Hurts little more Pain Location: R hip Pain Descriptors / Indicators: Guarding;Grimacing Pain Intervention(s): Limited activity within patient's tolerance;Monitored during session;Repositioned     Hand Dominance Right   Extremity/Trunk Assessment Upper Extremity Assessment Upper Extremity Assessment: Generalized weakness   Lower  Extremity Assessment Lower Extremity Assessment: Defer to PT evaluation       Communication  Communication Communication: No difficulties   Cognition Arousal/Alertness: Awake/alert Behavior During Therapy: WFL for tasks assessed/performed Overall Cognitive Status: Within Functional Limits for tasks assessed                     General Comments       Exercises       Shoulder Instructions      Home Living Family/patient expects to be discharged to:: Private residence Living Arrangements: Alone Available Help at Discharge: Family;Friend(s) Type of Home: House Home Access: Stairs to enter [employer] of Steps: 4 Entrance Stairs-Rails: None Home Layout: One level;Other (Comment) (1 step from main living area to kitchen)     Bathroom Shower/Tub: Occupational psychologist: Standard     Home Equipment: None          Prior Functioning/Environment Level of Independence: Independent        Comments: Completely independent with everything before fall with fx        OT Problem List: Decreased strength;Decreased range of motion;Decreased activity tolerance;Impaired balance (sitting and/or standing);Decreased safety awareness;Decreased knowledge of use of DME or AE;Decreased knowledge of precautions;Pain   OT Treatment/Interventions: Self-care/ADL training;Therapeutic exercise;Energy conservation;DME and/or AE instruction;Therapeutic activities;Patient/family education;Balance training    OT Goals(Current goals can be found in the care plan section) Acute Rehab OT Goals Patient Stated Goal: none stated OT Goal Formulation: With patient Time For Goal Achievement: 06/29/16 Potential to Achieve Goals: Good  OT Frequency:     Barriers to D/C:            Co-evaluation              End of Session Equipment Utilized During Treatment: Gait belt;Rolling walker Nurse Communication: Mobility status  Activity Tolerance: Patient tolerated treatment well Patient left: in chair;with call bell/phone within reach   Time: 1240-1304 OT  Time Calculation (min): 24 min Charges:  OT General Charges $OT Visit: 1 Procedure OT Evaluation $OT Eval Moderate Complexity: 1 Procedure OT Treatments $Self Care/Home Management : 8-22 mins   Norman Herrlich, OTR/L [phone] 06/15/2016, 1:57 PM

## 2016-06-15 NOTE — Progress Notes (Signed)
Physical Therapy Treatment Patient Details Name: Cindy Meza MRN: [id number] DOB: 1936/01/04 Today's Date: 06/15/2016    History of Present Illness 80 yo female admitted via ED on 06/12/16 following fall stepping into hole while raking leaves. Pt was found to have R hip fx and underwent IM nail on 06/13/16. PMH significant for Asthma, Osteopenia, OA.      PT Comments    Patient continues to require assistance for all mobility and +2 for stand pivot due to difficulty maintaining WB status R LE. Pt with internally rotated R LE and difficulty achieving neutral position. Continue to progress as tolerated with anticipated d/c to SNF for further skilled PT services.    Follow Up Recommendations  SNF     Equipment Recommendations  Rolling walker with 5" wheels;3in1 (PT);Other (comment) (or defer to next venue of care)    Recommendations for Other Services       Precautions / Restrictions Precautions Precautions: Fall Restrictions Weight Bearing Restrictions: Yes RLE Weight Bearing: Touchdown weight bearing    Mobility  Bed Mobility Overal bed mobility: Needs Assistance Bed Mobility: Supine to Sit     Supine to sit: Mod assist;+2 for physical assistance     General bed mobility comments: assist to bring bilat LE to EOB and to elevate trunk into sitting; cues for sequencing  Transfers Overall transfer level: Needs assistance Equipment used: Rolling walker (2 wheeled) Transfers: Sit to/from [facility] Sit to Stand: Mod assist Stand pivot transfers: Mod assist;+2 physical assistance;+2 safety/equipment       General transfer comment: cues for hand placement, technique, and WB status; pt unable to maintain TDWB and required +2 to pivot to recliner  Ambulation/Gait             General Gait Details: deferred due to pt unable to maintain WB status R LE   Stairs            Wheelchair Mobility    Modified Rankin (Stroke Patients Only)       Balance Overall balance assessment: Needs assistance;History of Falls Sitting-balance support: Feet supported;Bilateral upper extremity supported Sitting balance-Leahy Scale: Fair     Standing balance support: Bilateral upper extremity supported Standing balance-Leahy Scale: Poor Standing balance comment: Relies on Rw and requires assistance to maneuver with RW                    Cognition Arousal/Alertness: Awake/alert Behavior During Therapy: WFL for tasks assessed/performed Overall Cognitive Status: Within Functional Limits for tasks assessed                      Exercises Total Joint Exercises Heel Slides: AAROM;Right;5 reps;Limitations Heel Slides Limitations: pt resisted hip/knee flexion    General Comments General comments (skin integrity, edema, etc.): pt with internally rotated R LE and has difficulty with achieving/maintaining neutral position      Pertinent Vitals/Pain Pain Assessment: Faces Faces Pain Scale: Hurts little more Pain Location: R hip Pain Descriptors / Indicators: Guarding;Grimacing Pain Intervention(s): Limited activity within patient's tolerance;Monitored during session;Repositioned    Home Living                      Prior Function            PT Goals (current goals can now be found in the care plan section) Acute Rehab PT Goals Patient Stated Goal: none stated PT Goal Formulation: With patient Time For Goal Achievement: 06/21/16 Potential to Achieve  Goals: Good Progress towards PT goals: Progressing toward goals    Frequency    Min 3X/week      PT Plan Current plan remains appropriate    Co-evaluation             End of Session Equipment Utilized During Treatment: Gait belt Activity Tolerance: Patient limited by pain Patient left: in chair;with call bell/phone within reach;with family/visitor present;with nursing/sitter in room     Time: 0835-0902 PT Time Calculation (min) (ACUTE ONLY): 27  min  Charges:  $Therapeutic Activity: 23-37 mins                    G Codes:      Salina April, PTA Pager: [phone]   06/15/2016, 11:33 AM

## 2016-06-15 NOTE — NC FL2 (Signed)
[place] LEVEL OF CARE SCREENING TOOL     IDENTIFICATION  Patient Name: Cindy Meza Birthdate: 11-01-1935 Sex: female Admission Date (Current Location): 06/12/2016  [facility] and [place] Number:  Herbalist and Address:  [facility] [place], [place] [address], [place], [place] [address]      Provider Number: [id number]  Attending Physician Name and Address:  Robbie Lis, MD  Relative Name and Phone Number:       Current Level of Care: Hospital Recommended Level of Care: [place] Prior Approval Number:    Date Approved/Denied:   PASRR Number: [id number]  Discharge Plan: SNF    Current Diagnoses: Patient Active Problem List   Diagnosis Date Noted  . Closed intertrochanteric fracture of hip, right, initial encounter ([place]) 06/12/2016  . Fall 06/12/2016  . Closed right hip fracture ([place]) 06/12/2016  . Cerumen impaction 04/06/2016  . Fatigue 02/15/2016  . DOE (dyspnea on exertion) 02/15/2016  . Headache 09/27/2015  . Coronary atherosclerosis 06/29/2015  . Urgency of urination 05/17/2015  . Abdominal pain 03/30/2015  . Paresthesia of arm 09/21/2014  . Breast pain, left 09/21/2014  . Chest pressure 07/22/2012  . Lightheadedness 07/17/2012  . Elevated BP 07/17/2012  . Hot flash, menopausal 02/15/2012  . Allergic rhinitis, cause unspecified 09/12/2011  . Well adult exam 02/14/2011  . Anemia 10/17/2009  . HEMORRHOIDS, WITH BLEEDING 10/05/2009  . Vitamin D deficiency 06/17/2007  . Asthma 06/17/2007  . GERD (gastroesophageal reflux disease) 06/17/2007  . OSTEOARTHRITIS 06/17/2007  . Osteoporosis 06/17/2007  . COLONIC POLYPS, HX OF 06/17/2007    Orientation RESPIRATION BLADDER Height & Weight     Self, Time, Situation, Place  Normal Continent Weight:   Height:     BEHAVIORAL SYMPTOMS/MOOD NEUROLOGICAL BOWEL NUTRITION STATUS   (NONE)  (NONE) Continent  (Regular)  AMBULATORY STATUS COMMUNICATION OF NEEDS  Skin   Extensive Assist Verbally Surgical wounds (Hip Right)                       Personal Care Assistance Level of Assistance  Bathing, Feeding, Dressing Bathing Assistance: Limited assistance Feeding assistance: Independent Dressing Assistance: Limited assistance     Functional Limitations Info  Sight, Hearing, Speech Sight Info: Adequate Hearing Info: Adequate Speech Info: Adequate    SPECIAL CARE FACTORS FREQUENCY  PT (By licensed PT), OT (By licensed OT)     PT Frequency: 5/week OT Frequency: 5/week            Contractures Contractures Info: Not present    Additional Factors Info  Code Status, Allergies Code Status Info: Full Code Allergies Info: Fosamax Alendronate Sodium, Gabapentin, Lidocaine, Risedronate Sodium, Shellfish Allergy, Statins           Current Medications (06/15/2016):  This is the current hospital active medication list Current Facility-Administered Medications  Medication Dose Route Frequency Provider Last Rate Last Dose  . 0.9 %  sodium chloride infusion   Intravenous Continuous Robbie Lis, MD 75 mL/hr at 06/14/16 1102    . acetaminophen (TYLENOL) tablet 650 mg  650 mg Oral Q6H PRN Grier Mitts, PA-C   650 mg at 06/14/16 1148   Or  . acetaminophen (TYLENOL) suppository 650 mg  650 mg Rectal Q6H PRN Grier Mitts, PA-C      . aspirin EC tablet 325 mg  325 mg Oral Q breakfast Grier Mitts, PA-C   325 mg at 06/15/16 0858  . cholecalciferol (VITAMIN D) tablet  1,000 Units  1,000 Units Oral Daily Roney Jaffe, MD   1,000 Units at 06/15/16 [phone]  . dextrose 5 % and 0.45 % NaCl with KCl 20 mEq/L infusion   Intravenous Continuous Roney Jaffe, MD      . famotidine (PEPCID) tablet 20 mg  20 mg Oral BID Roney Jaffe, MD   20 mg at 06/15/16 0858  . HYDROcodone-acetaminophen (NORCO/VICODIN) 5-325 MG per tablet 1-2 tablet  1-2 tablet Oral Q6H PRN Roney Jaffe, MD      . menthol-cetylpyridinium (CEPACOL) lozenge 3 mg  1  lozenge Oral PRN Grier Mitts, PA-C       Or  . phenol (CHLORASEPTIC) mouth spray 1 spray  1 spray Mouth/Throat PRN Grier Mitts, PA-C      . metoCLOPramide (REGLAN) tablet 5-10 mg  5-10 mg Oral Q8H PRN Grier Mitts, PA-C       Or  . metoCLOPramide (REGLAN) injection 5-10 mg  5-10 mg Intravenous Q8H PRN Grier Mitts, PA-C      . mometasone-formoterol (DULERA) 200-5 MCG/ACT inhaler 2 puff  2 puff Inhalation BID Roney Jaffe, MD   2 puff at 06/14/16 2113  . montelukast (SINGULAIR) tablet 10 mg  10 mg Oral QHS Roney Jaffe, MD   10 mg at 06/14/16 2204  . morphine 2 MG/ML injection 0.5 mg  0.5 mg Intravenous Q2H PRN Grier Mitts, PA-C      . ondansetron (ZOFRAN) tablet 4 mg  4 mg Oral Q6H PRN Grier Mitts, PA-C       Or  . ondansetron (ZOFRAN) injection 4 mg  4 mg Intravenous Q6H PRN Grier Mitts, PA-C         Discharge Medications: Please see discharge summary for a list of discharge medications.  Relevant Imaging Results:  Relevant Lab Results:   Additional Information SSN: [id number]  Rigoberto Noel, LCSW

## 2016-06-15 NOTE — Progress Notes (Signed)
Patient is very confused and telling the nurse and her family to "put those boxes in the trunk." She is saying that it is books and mattresses that she has cut up, Lots of incoherent rambling as well. MD paged to be notified. Nurse will continue to monitor .Cindy Sachs A Louisa Favaro, RN

## 2016-06-15 NOTE — Care Management Important Message (Signed)
Important Message  Patient Details  Name: Cindy Meza MRN: [id number] Date of Birth: 03-28-1936   Medicare Important Message Given:  Yes    Orbie Pyo 06/15/2016, 12:39 PM

## 2016-06-15 NOTE — Care Management Note (Signed)
Case Management Note  Patient Details  Name: NEKESHA MESTER MRN: [id number] Date of Birth: Jun 14, 1936  Subjective/Objective:   80 yr old female s/p fall, under went Right hip IM nailing. Patient has developed some confusion/delirium post operatively.                 Action/Plan: Patient will need short term rehab at SNF. Social worker has spoken with patient and is arranging.   Expected Discharge Date:   06/15/16               Expected Discharge Plan:  [place]  In-House Referral:  Clinical Social Work  Discharge planning Services  CM Consult  Post Acute Care Choice:  NA Choice offered to:     DME Arranged:  N/A DME Agency:  NA  HH Arranged:  NA HH Agency:     Status of Service:  Completed, signed off  If discussed at [employer] of Stay Meetings, dates discussed:    Additional Comments:  Ninfa Meeker, RN 06/15/2016, 12:02 PM

## 2016-06-15 NOTE — Progress Notes (Signed)
Report called to [facility] and given to [place]. IV removed. Patient is waiting for [facility]AR for transportation

## 2016-06-15 NOTE — Plan of Care (Signed)
Problem: Safety: Goal: Ability to remain free from injury will improve Outcome: Progressing No fall or injury noted  Problem: Pain Managment: Goal: General experience of comfort will improve Outcome: Progressing Denies pain and discomfort  Problem: Physical Regulation: Goal: Will remain free from infection Outcome: Progressing No s/s of infection noted, VS WNL  Problem: Skin Integrity: Goal: Risk for impaired skin integrity will decrease Outcome: Progressing No skin issues noted, mepilex dry and intact to right hip incisions  Problem: Tissue Perfusion: Goal: Risk factors for ineffective tissue perfusion will decrease Outcome: Progressing Denies S/S of DVT, SCDs are on  Problem: Bowel/Gastric: Goal: Will not experience complications related to bowel motility Outcome: Progressing Denies gastric and bowel issues

## 2016-06-15 NOTE — Progress Notes (Signed)
   PATIENT ID: Cindy Meza   2 Days Post-Op Procedure(s) (LRB): INTRAMEDULLARY (IM) NAIL INTERTROCHANTRIC (Right)  Subjective: Hallucinating overnight per family and nurse, not taking pain medicine but has not complained of pain.   Objective:  Vitals:   06/14/16 1926 06/15/16 0300  BP: (!) 101/49 140/63  Pulse: 98 97  Resp: 15 16  Temp: 98.8 F (37.1 C) 99.7 F (37.6 C)     R hip dressing c/d/i Wiggles toes, distally NVI  Labs:   Recent Labs  06/12/16 2132 06/14/16 0429 06/14/16 0823 06/14/16 2129 06/15/16 0446  HGB 11.1* 6.4* 6.3* 10.0* 9.4*   Recent Labs  06/14/16 0429  06/14/16 2129 06/15/16 0446  WBC 6.8  --   --  8.3  RBC 2.06*  --   --  3.17*  HCT 19.5*  < > 29.5* 27.7*  PLT 184  --   --  141*  < > = values in this interval not displayed. Recent Labs  06/14/16 0429 06/15/16 0446  NA 137 139  K 4.0 3.3*  CL 105 105  CO2 25 24  BUN 11 7  CREATININE 0.81 0.69  GLUCOSE 116* 108*  CALCIUM 7.9* 8.3*    Assessment and Plan: 2 days s/p right IM femoral nail for fx Touch down weightbearing with PT ABLA- improved after transfusion, cont to monitor Pain level is low, altered mental state- minimize medication, tylenol only for pain unless stronger meds are absolutely needed ASA 325 daily for DVT prophylaxis NEEDS SCDs ON and communicated that with her nurse  Per PT- d/c to SNF, likely today, scripts signed in chart We appreciate medicine team's help in care for this patient Fu with Dr. Tamera Punt in 2 weeks

## 2016-06-15 NOTE — Discharge Instructions (Signed)
Discharge Instructions after Hip Surgery   Touchdown weightbearing Use ice on the hip intermittently over the first 48 hours after surgery.  Pain medicine has been prescribed for you.  Use your medicine liberally over the first 48 hours, and then you can begin to taper your use. You may take Extra Strength Tylenol or Tylenol only in place of the pain pills. DO NOT take ANY nonsteroidal anti-inflammatory pain medications: Advil, Motrin, Ibuprofen, Aleve, Naproxen or Naprosyn.  Take aspirin or anticoagulant medication as prescribed for 2 weeks after surgery. Please notify if allergic or sensitivity to aspirin. You may remove your dressing after two days.  You may shower 5 days after surgery. The incisions CANNOT get wet prior to 5 days. Simply allow the water to wash over the site and then pat dry. Do not rub the incisions.    Please call [phone] during normal business hours or [phone] after hours for any problems. Including the following:  - excessive redness of the incisions - drainage for more than 4 days - fever of more than 101.5 F  *Please note that pain medications will not be refilled after hours or on weekends.

## 2016-06-15 NOTE — Consult Note (Signed)
[facility] CM Primary Care Navigator  06/15/2016  Cindy Meza 23-Nov-1935 [id number]  Met with patient and son Cindy Meza) at the bedside to identify possible discharge needs. Patient reports having a fall resulting to right hip fracture that led to this admission/surgery.  Patient endorses Dr. Tyrone Apple [name] with [employer] at [place] as the primary care provider.    Patient shared using [facility]) to obtain medications without any problem so far.   Patient states managing her own medications at home using "pill box" system.   Patient lives alone and able to drive prior to admission. She shared that her family and friends can provide transportation to her doctors' appointments after discharge.  Patient reports having the support from family and friends to assist with her care needs at home.   Discharge plan is short term rehabilitation in a skilled nursing facility [facility]) before returning home. Awaiting to be transported to SNF for now.  Patient voiced understanding to call primary care provider's office when she returns home, for a post discharge follow-up appointment within a week or sooner if needs arise. Patient letter given as a reminder.  She denies further needs or concerns at this time.  For additional questions please contact:  Edwena Felty A. Nasri Boakye, BSN, RN-BC [facility] PRIMARY CARE Navigator Cell: [phone]

## 2016-06-15 NOTE — Clinical Social Work Placement (Signed)
   CLINICAL SOCIAL WORK PLACEMENT  NOTE  Date:  06/15/2016  Patient Details  Name: Cindy Meza MRN: [id number] Date of Birth: 1936-02-12  Clinical Social Work is seeking post-discharge placement for this patient at the [place] level of care (*CSW will initial, date and re-position this form in  chart as items are completed):  Yes   Patient/family provided with [place] Work Department's list of facilities offering this level of care within the geographic area requested by the patient (or if unable, by the patient's family).  Yes   Patient/family informed of their freedom to choose among providers that offer the needed level of care, that participate in Medicare, Medicaid or managed care program needed by the patient, have an available bed and are willing to accept the patient.  Yes   Patient/family informed of [facility]'s ownership interest in [facility] and [facility], as well as of the fact that they are under no obligation to receive care at these facilities.  PASRR submitted to EDS on 06/15/16     PASRR number received on 06/15/16     Existing PASRR number confirmed on       FL2 transmitted to all facilities in geographic area requested by pt/family on 06/15/16     FL2 transmitted to all facilities within larger geographic area on       Patient informed that his/her managed care company has contracts with or will negotiate with certain facilities, including the following:        Yes   Patient/family informed of bed offers received.  Patient chooses bed at [facility]     Physician recommends and patient chooses bed at      Patient to be transferred to [facility] on 06/15/16.  Patient to be transferred to facility by Ambulance     Patient family notified on 06/15/16 of transfer.  Name of family member notified:  Herbie [place]     PHYSICIAN Please prepare priority discharge summary, including medications, Please prepare  prescriptions, Please sign FL2     Additional Comment:  Per MD patient ready for DC to [facility]. RN, patient, patient's family (son and husband at bedside), and facility notified of DC. RN given number for report. DC packet on chart. Ambulance transport requested for patient for 4:30PM pickup. CSW signing off.   _______________________________________________ Rigoberto Noel, LCSW 06/15/2016, 2:29 PM

## 2016-06-15 NOTE — Discharge Summary (Signed)
Physician Discharge Summary  Cindy Meza [id number] DOB: [birth year] DOA: 06/12/2016  PCP: [name], MD  Admit date: 06/12/2016 Discharge date: 06/15/2016  Recommendations for Outpatient Follow-up:  Aspirin daily for DVT prophylaxis. Follow up with Dr. Tamera Punt of ortho in 2 weeks.  Discharge Diagnoses:  Principal Problem:   Closed intertrochanteric fracture of hip, right, initial encounter [facility]) Active Problems:   GERD (gastroesophageal reflux disease)   Osteoporosis   Fall   Closed right hip fracture ([place])    Discharge Condition: stable   Diet recommendation: as tolerated   History of present illness:  80 y.o.femalewho presented status post fall at home. X rays showed right intertrochanteric displaced hip fracture. Transferred to [facility] for OR, by Dr Tamera Punt. She underwent right intramedullary nail 12/6.  Hospital Course:   Assessment & Plan:   Closed intertrochanteric fracture of hip, right / fall  - S/P right intermedullary nail 12/6 - Continue aspirin daily  - PT eval - SNF placement  - Appreciate ortho recommendations   Acute postoperative blood loss anemia - Hgb 6.3 this am - Hgb 9.4 status post 2 U PRBC transfusion 12/7  Leukocytosis - Suspect reactive - Subsequently resolved  - CXR no evidence of infection.    DVT prophylaxis: SCD's bilaterally  Code Status: full code  Family Communication: husband at the bedside this am   Consultants:   Orthopedic surgery   Procedures:   RIGHT INTRAMEDULLARY (IM) NAIL INTERTROCHANTERIC 06/13/2016   Antimicrobials:   Preop cefazolin 12/6     Signed:  Leisa Lenz, MD  [facility] 06/15/2016, 11:26 AM  Pager #: [phone]  Time spent in minutes: less than 30 minutes   Discharge Exam: Vitals:   06/14/16 1926 06/15/16 0300  BP: (!) 101/49 140/63  Pulse: 98 97  Resp: 15 16  Temp: 98.8 F (37.1 C) 99.7 F (37.6 C)   Vitals:   06/14/16 1554 06/14/16 1753 06/14/16  1926 06/15/16 0300  BP: (!) 116/55 (!) 141/61 (!) 101/49 140/63  Pulse: 97 100 98 97  Resp: 16 14 15 16   Temp: 98.8 F (37.1 C) 98.9 F (37.2 C) 98.8 F (37.1 C) 99.7 F (37.6 C)  TempSrc: Oral Oral Oral Oral  SpO2: 100% 100% 100% 95%    General: Pt is alert, not in acute distress Cardiovascular: Regular rate and rhythm, S1/S2 + Respiratory: Clear to auscultation bilaterally, no wheezing, no crackles, no rhonchi Abdominal: Soft, non tender, non distended, bowel sounds +, no guarding Extremities: no edema, no cyanosis, pulses palpable bilaterally DP and PT Neuro: Grossly nonfocal  Discharge Instructions  Discharge Instructions    Call MD for:  persistant nausea and vomiting    Complete by:  As directed    Call MD for:  redness, tenderness, or signs of infection (pain, swelling, redness, odor or green/yellow discharge around incision site)    Complete by:  As directed    Call MD for:  severe uncontrolled pain    Complete by:  As directed    Diet - low sodium heart healthy    Complete by:  As directed    Discharge instructions    Complete by:  As directed    Aspirin daily for DVT prophylaxis. Follow up with Dr. Tamera Punt of ortho in 2 weeks.   Increase activity slowly    Complete by:  As directed    Touch down weight bearing    Complete by:  As directed        Medication List  TAKE these medications   albuterol 108 (90 Base) MCG/ACT inhaler Commonly known as:  VENTOLIN HFA Inhale 1-2 puffs into the lungs every 6 (six) hours as needed. What changed:  reasons to take this   aspirin 325 MG EC tablet Take 1 tablet (325 mg total) by mouth daily with breakfast. What changed:  medication strength  how much to take  when to take this   CALCIUM 500 PO Take 1 each by mouth daily.   Cholecalciferol 1000 units tablet Take 1,000 Units by mouth daily.   fluticasone-salmeterol 115-21 MCG/ACT inhaler Commonly known as:  ADVAIR HFA Inhale 2 puffs into the lungs 2 (two)  times daily.   HYDROcodone-acetaminophen 5-325 MG tablet Commonly known as:  NORCO/VICODIN Take 1-2 tablets by mouth every 6 (six) hours as needed for moderate pain.   loratadine 10 MG tablet Commonly known as:  CLARITIN Take 10 mg by mouth daily.   MEGARED OMEGA-3 KRILL OIL 500 MG Caps Take 1 capsule by mouth every morning.   montelukast 10 MG tablet Commonly known as:  SINGULAIR Take 1 tablet (10 mg total) by mouth daily. What changed:  when to take this   ranitidine 150 MG tablet Commonly known as:  ZANTAC Take 1 tablet (150 mg total) by mouth 2 (two) times daily.   vitamin E 400 UNIT capsule Commonly known as:  vitamin E Take 1 capsule (400 Units total) by mouth daily.            Durable Medical Equipment        Start     Ordered   06/15/16 0759  For home use only DME Bedside commode  Once    Question:  Patient needs a bedside commode to treat with the following condition  Answer:  S/P right hip fracture   06/15/16 0758   06/15/16 0758  For home use only DME standard manual wheelchair with seat cushion  Once    Comments:  Patient suffers from s/p right hip fracture which impairs their ability to perform daily activities like bathing in the home.  A walker will not resolve  issue with performing activities of daily living. A wheelchair will allow patient to safely perform daily activities. Patient can safely propel the wheelchair in the home or has a caregiver who can provide assistance.  Accessories: elevating leg rests (ELRs), wheel locks, extensions and anti-tippers.   06/15/16 0758     Follow-up Information    [name], MD. Schedule an appointment as soon as possible for a visit in 1 week(s).   Specialty:  Internal Medicine Contact information: [place] [id number] [phone]            The results of significant diagnostics from this hospitalization (including imaging, microbiology, ancillary and laboratory) are listed below  for reference.    Significant Diagnostic Studies: Dg Chest 1 View  Result Date: 06/12/2016 CLINICAL DATA:  80 y/o  F; status post fall. EXAM: CHEST 1 VIEW COMPARISON:  01/16/2006 chest radiograph FINDINGS: Stable cardiac silhouette within normal limits given projection and technique. Aortic atherosclerosis with arch calcification. Clear lungs. No acute osseous abnormality is evident IMPRESSION: No active disease. Electronically Signed   By: Kristine Garbe M.D.   On: 06/12/2016 22:41   Dg C-arm 1-60 Min  Result Date: 06/13/2016 CLINICAL DATA:  80 year old female undergoing right femur ORIF. Initial encounter. EXAM: DG C-ARM 61-120 MIN; RIGHT FEMUR 2 VIEWS COMPARISON:  Right femur series 12517. FINDINGS: 4 intraoperative fluoroscopic spot  views of the right femur done demonstrating intra medullary rod placement with proximal interlocking dynamic hip screw and distal interlocking cortical screw. Improved alignment about the comminuted intertrochanteric fracture. Hardware appears intact. FLUOROSCOPY TIME:  2 minutes 22 seconds IMPRESSION: Right femur ORIF with improved intertrochanteric fracture alignment. Electronically Signed   By: Genevie Ann M.D.   On: 06/13/2016 14:06   Dg Hip Unilat  With Pelvis 2-3 Views Right  Result Date: 06/12/2016 CLINICAL DATA:  Initial evaluation for acute trauma. EXAM: DG HIP (WITH OR WITHOUT PELVIS) 2-3V RIGHT COMPARISON:  None. FINDINGS: There is an acute mildly displaced intertrochanteric fracture of the right hip. Right femoral head remains normally position within the acetabulum. Femoral head height preserved. Limited views of the left hip demonstrate no acute osseous abnormality. 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 Bony pelvis intact. SI joints approximated. IMPRESSION: Acute mildly displaced intertrochanteric fracture of the right hip. Electronically Signed   By: Jeannine Boga M.D.   On: 06/12/2016 21:11   Dg Femur 1v  Right  Result Date: 06/12/2016 CLINICAL DATA:  80 y/o  F; hip fracture EXAM: RIGHT FEMUR 1 VIEW COMPARISON:  None. FINDINGS: Partially visualized displaced and angulated intertrochanteric fracture of the right hip. The femoral shaft is otherwise intact. The visualized knee joint is grossly maintained. IMPRESSION: Partially visualized displaced and angulated intertrochanteric fracture of the right hip. The femoral shaft is otherwise intact. Electronically Signed   By: Kristine Garbe M.D.   On: 06/12/2016 22:43   Dg Femur, Min 2 Views Right  Result Date: 06/13/2016 CLINICAL DATA:  80 year old female undergoing right femur ORIF. Initial encounter. EXAM: DG C-ARM 61-120 MIN; RIGHT FEMUR 2 VIEWS COMPARISON:  Right femur series 12517. FINDINGS: 4 intraoperative fluoroscopic spot views of the right femur done demonstrating intra medullary rod placement with proximal interlocking dynamic hip screw and distal interlocking cortical screw. Improved alignment about the comminuted intertrochanteric fracture. Hardware appears intact. FLUOROSCOPY TIME:  2 minutes 22 seconds IMPRESSION: Right femur ORIF with improved intertrochanteric fracture alignment. Electronically Signed   By: Genevie Ann M.D.   On: 06/13/2016 14:06   Dg Femur Port, Min 2 Views Right  Result Date: 06/13/2016 CLINICAL DATA:  IM nail. EXAM: RIGHT FEMUR PORTABLE 2 VIEW COMPARISON:  06/12/2016 FINDINGS: Internal fixation across the comminuted right femoral intertrochanteric fracture. Fracture fragments remain mildly displaced. Decreasing varus angulation. No hardware complicating feature. IMPRESSION: Internal fixation across the comminuted right femoral intertrochanteric fracture with continued mild displacement of fracture fragments. Electronically Signed   By: Rolm Baptise M.D.   On: 06/13/2016 15:08    Microbiology: Recent Results (from the past 240 hour(s))  Surgical pcr screen     Status: None   Collection Time: 06/13/16 10:11 AM   Result Value Ref Range Status   MRSA, PCR NEGATIVE NEGATIVE Final   Staphylococcus aureus NEGATIVE NEGATIVE Final    Comment:        The Xpert SA Assay (FDA approved for NASAL specimens in patients over 86 years of age), is one component of a comprehensive surveillance program.  Test performance has been validated by [facility] for patients greater than or equal to 74 year old. It is not intended to diagnose infection nor to guide or monitor treatment.      Labs: Basic Metabolic Panel:  Recent Labs Lab 06/12/16 2132 06/14/16 0429 06/15/16 0446  NA 137 137 139  K 3.5 4.0 3.3*  CL 102 105 105  CO2 27 25 24   GLUCOSE 155* 116*  108*  BUN 18 11 7   CREATININE 0.80 0.81 0.69  CALCIUM 9.0 7.9* 8.3*   Liver Function Tests: No results for input(s): AST, ALT, ALKPHOS, BILITOT, PROT, ALBUMIN in the last 168 hours. No results for input(s): LIPASE, AMYLASE in the last 168 hours. No results for input(s): AMMONIA in the last 168 hours. CBC:  Recent Labs Lab 06/12/16 2132 06/14/16 0429 06/14/16 0823 06/14/16 2129 06/15/16 0446  WBC 12.0* 6.8  --   --  8.3  NEUTROABS 10.6*  --   --   --   --   HGB 11.1* 6.4* 6.3* 10.0* 9.4*  HCT 33.5* 19.5* 18.6* 29.5* 27.7*  MCV 96.0 94.7  --   --  87.4  PLT 300 184  --   --  141*   Cardiac Enzymes: No results for input(s): CKTOTAL, CKMB, CKMBINDEX, TROPONINI in the last 168 hours. BNP: BNP (last 3 results) No results for input(s): BNP in the last 8760 hours.  ProBNP (last 3 results) No results for input(s): PROBNP in the last 8760 hours.  CBG: No results for input(s): GLUCAP in the last 168 hours.

## 2016-06-18 ENCOUNTER — Encounter: Payer: Self-pay | Admitting: Internal Medicine

## 2016-06-18 ENCOUNTER — Non-Acute Institutional Stay (SKILLED_NURSING_FACILITY): Payer: Medicare Other | Admitting: Internal Medicine

## 2016-06-18 DIAGNOSIS — J309 Allergic rhinitis, unspecified: Secondary | ICD-10-CM

## 2016-06-18 DIAGNOSIS — E876 Hypokalemia: Secondary | ICD-10-CM | POA: Diagnosis not present

## 2016-06-18 DIAGNOSIS — D62 Acute posthemorrhagic anemia: Secondary | ICD-10-CM

## 2016-06-18 DIAGNOSIS — J4520 Mild intermittent asthma, uncomplicated: Secondary | ICD-10-CM | POA: Diagnosis not present

## 2016-06-18 DIAGNOSIS — K219 Gastro-esophageal reflux disease without esophagitis: Secondary | ICD-10-CM | POA: Diagnosis not present

## 2016-06-18 DIAGNOSIS — R2681 Unsteadiness on feet: Secondary | ICD-10-CM

## 2016-06-18 DIAGNOSIS — S72001S Fracture of unspecified part of neck of right femur, sequela: Secondary | ICD-10-CM

## 2016-06-18 DIAGNOSIS — D696 Thrombocytopenia, unspecified: Secondary | ICD-10-CM

## 2016-06-18 NOTE — Progress Notes (Signed)
LOCATION: [facility]  PCP: [name], MD   Code Status: Full Code  Goals of care: Advanced Directive information Advanced Directives 06/14/2016  Does Patient Have a Medical Advance Directive? Yes  Type of Advance Directive [place]  Does patient want to make changes to medical advance directive? No - Patient declined  Copy of [place] in Chart? No - copy requested       Extended Emergency Contact Information Primary Emergency Contact: Alden Server [place] of [place] Phone: [phone] Relation: Friend   Allergies  Allergen Reactions  . Fosamax [Alendronate Sodium]     paresthesias  . Gabapentin     dizziness  . Lidocaine Swelling    Of face and lips.  Novocaine does same thing.  . Risedronate Sodium     Knee pain  . Shellfish Allergy Hives  . Statins     REACTION: pains    Chief Complaint  Patient presents with  . New Admit To SNF    New Admission Visit     HPI:  Patient is a 80 y.o. female seen today for short term rehabilitation post hospital admission from 06/12/16-06/15/16 with right intertrochanteric displaced hip fracture. She underwent IM nailing on 06/13/16. She had ABLA and received 2 u prbc transfusion. She is seen in her room today.   Review of Systems:  Constitutional: Negative for fever, chills, diaphoresis. Energy level is slowly coming back. HENT: Negative for headache, congestion, nasal discharge, hearing loss, sore throat, difficulty swallowing.   Eyes: Negative for blurred vision, double vision and discharge.  Respiratory: Negative for cough, shortness of breath and wheezing.   Cardiovascular: Negative for chest pain, palpitations, leg swelling.  Gastrointestinal: Negative for heartburn, nausea, vomiting, abdominal pain, loss of appetite, diarrhea and constipation. Has history of GERD zantac helps. She had a bowel movement yesterday and would like to back off on her bowel  regimen. Genitourinary: Negative for dysuria and flank pain.  Musculoskeletal: Negative for back pain, fall in the facility.  Skin: Negative for itching, rash.  Neurological: Negative for dizziness. Psychiatric/Behavioral: Negative for depression.   Past Medical History:  Diagnosis Date  . Asthma    Dr. Melvyn Novas  . Breast cyst    Left  . Cataract    Dr. Gershon Crane  . Complication of anesthesia    ' I HAD HALLUCINATIONS WHEN THEY DID MY HIP "  . GERD (gastroesophageal reflux disease)   . Hemorrhoids   . History of colonic polyps [name]   hx of adenomas (small) 1998  . OA (osteoarthritis)   . Osteopenia   . PAC (premature atrial contraction)   . Vitamin D deficiency    Past Surgical History:  Procedure Laterality Date  . ABDOMINAL HYSTERECTOMY     complete  . APPENDECTOMY    . BREAST CYST EXCISION    . COLONOSCOPY    . INTRAMEDULLARY (IM) NAIL INTERTROCHANTERIC Right 06/13/2016   Procedure: INTRAMEDULLARY (IM) NAIL INTERTROCHANTRIC;  Surgeon: Tania Ade, MD;  Location: [facility];  Service: Orthopedics;  Laterality: Right;  . TONSILLECTOMY     Social History:   reports that she has never smoked. She has never used smokeless tobacco. She reports that she drinks alcohol. She reports that she does not use drugs.  Family History  Problem Relation Age of Onset  . Diabetes Mother 58    Deceased  . Hypertension Other   . Colon cancer Neg Hx   . Pancreatic cancer Neg Hx   .  Stomach cancer Neg Hx     Medications:   Medication List       Accurate as of 06/18/16 12:21 PM. Always use your most recent med list.          albuterol 108 (90 Base) MCG/ACT inhaler Commonly known as:  PROVENTIL HFA;VENTOLIN HFA Inhale 1-2 puffs into the lungs every 6 (six) hours.   aspirin 325 MG EC tablet Take 1 tablet (325 mg total) by mouth daily with breakfast.   CALCIUM 500 PO Take 1 each by mouth daily.   Cholecalciferol 1000 units tablet Take 1,000 Units by mouth daily.    fluticasone-salmeterol 115-21 MCG/ACT inhaler Commonly known as:  ADVAIR HFA Inhale 2 puffs into the lungs 2 (two) times daily.   HYDROcodone-acetaminophen 5-325 MG tablet Commonly known as:  NORCO/VICODIN Take 1-2 tablets by mouth every 6 (six) hours as needed for moderate pain.   loratadine 10 MG tablet Commonly known as:  CLARITIN Take 10 mg by mouth daily.   MEGARED OMEGA-3 KRILL OIL 500 MG Caps Take 1 capsule by mouth every morning.   montelukast 10 MG tablet Commonly known as:  SINGULAIR Take 1 tablet (10 mg total) by mouth daily.   ranitidine 150 MG tablet Commonly known as:  ZANTAC Take 1 tablet (150 mg total) by mouth 2 (two) times daily.   vitamin E 400 UNIT capsule Commonly known as:  vitamin E Take 1 capsule (400 Units total) by mouth daily.       Immunizations: Immunization History  Administered Date(s) Administered  . Influenza Split 05/16/2011, 05/07/2012  . Influenza Whole 04/29/2008, 04/27/2010  . Influenza, High Dose Seasonal PF 04/04/2016  . Influenza,inj,Quad PF,36+ Mos 03/17/2013, 03/23/2014, 03/30/2015  . PPD Test 06/15/2016  . Pneumococcal Conjugate-13 09/17/2013  . Pneumococcal Polysaccharide-23 02/02/2010  . Td 06/10/2007  . Zoster 10/28/2007     Physical Exam:  Vitals:   06/18/16 1216  BP: 124/69  Pulse: 89  Resp: 18  Temp: 99 F (37.2 C)  TempSrc: Oral  SpO2: 100%  Weight: 121 lb (54.9 kg)  Height: 5\' 3"  (1.6 m)   Body mass index is 21.43 kg/m.  General- elderly female, well built, in no acute distress Head- normocephalic, atraumatic Nose- no maxillary or frontal sinus tenderness, no nasal discharge Throat- moist mucus membrane  Eyes- PERRLA, EOMI, no pallor, no icterus, no discharge, normal conjunctiva, normal sclera Neck- no cervical lymphadenopathy Cardiovascular- normal s1,s2, no murmur, trace leg edema Respiratory- bilateral clear to auscultation, no wheeze, no rhonchi, no crackles, no use of accessory  muscles Abdomen- bowel sounds present, soft, non tender Musculoskeletal- able to move all 4 extremities, limited right hip range of motion Neurological- alert and oriented to person, place and time Skin- warm and dry, right hip 2 surgical incision with dressing clean and dry Psychiatry- normal mood and affect    Labs reviewed: Basic Metabolic Panel:  Recent Labs  06/12/16 2132 06/14/16 0429 06/15/16 0446  NA 137 137 139  K 3.5 4.0 3.3*  CL 102 105 105  CO2 27 25 24   GLUCOSE 155* 116* 108*  BUN 18 11 7   CREATININE 0.80 0.81 0.69  CALCIUM 9.0 7.9* 8.3*   Liver Function Tests: No results for input(s): AST, ALT, ALKPHOS, BILITOT, PROT, ALBUMIN in the last 8760 hours. No results for input(s): LIPASE, AMYLASE in the last 8760 hours. No results for input(s): AMMONIA in the last 8760 hours. CBC:  Recent Labs  02/15/16 1203 06/12/16 2132 06/14/16 0429 06/14/16 0823 06/14/16 2129  06/15/16 0446  WBC 7.9 12.0* 6.8  --   --  8.3  NEUTROABS 4.2 10.6*  --   --   --   --   HGB 12.6 11.1* 6.4* 6.3* 10.0* 9.4*  HCT 37.4 33.5* 19.5* 18.6* 29.5* 27.7*  MCV 93.0 96.0 94.7  --   --  87.4  PLT 301.0 300 184  --   --  141*   Cardiac Enzymes: No results for input(s): CKTOTAL, CKMB, CKMBINDEX, TROPONINI in the last 8760 hours. BNP: Invalid input(s): POCBNP CBG: No results for input(s): GLUCAP in the last 8760 hours.  Radiological Exams: Dg Chest 1 View  Result Date: 06/12/2016 CLINICAL DATA:  80 y/o  F; status post fall. EXAM: CHEST 1 VIEW COMPARISON:  01/16/2006 chest radiograph FINDINGS: Stable cardiac silhouette within normal limits given projection and technique. Aortic atherosclerosis with arch calcification. Clear lungs. No acute osseous abnormality is evident IMPRESSION: No active disease. Electronically Signed   By: Kristine Garbe M.D.   On: 06/12/2016 22:41   Dg C-arm 1-60 Min  Result Date: 06/13/2016 CLINICAL DATA:  80 year old female undergoing right femur  ORIF. Initial encounter. EXAM: DG C-ARM 61-120 MIN; RIGHT FEMUR 2 VIEWS COMPARISON:  Right femur series 12517. FINDINGS: 4 intraoperative fluoroscopic spot views of the right femur done demonstrating intra medullary rod placement with proximal interlocking dynamic hip screw and distal interlocking cortical screw. Improved alignment about the comminuted intertrochanteric fracture. Hardware appears intact. FLUOROSCOPY TIME:  2 minutes 22 seconds IMPRESSION: Right femur ORIF with improved intertrochanteric fracture alignment. Electronically Signed   By: Genevie Ann M.D.   On: 06/13/2016 14:06   Dg Hip Unilat  With Pelvis 2-3 Views Right  Result Date: 06/12/2016 CLINICAL DATA:  Initial evaluation for acute trauma. EXAM: DG HIP (WITH OR WITHOUT PELVIS) 2-3V RIGHT COMPARISON:  None. FINDINGS: There is an acute mildly displaced intertrochanteric fracture of the right hip. Right femoral head remains normally position within the acetabulum. Femoral head height preserved. Limited views of the left hip demonstrate no acute osseous abnormality. 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 Bony pelvis intact. SI joints approximated. IMPRESSION: Acute mildly displaced intertrochanteric fracture of the right hip. Electronically Signed   By: Jeannine Boga M.D.   On: 06/12/2016 21:11   Dg Femur 1v Right  Result Date: 06/12/2016 CLINICAL DATA:  80 y/o  F; hip fracture EXAM: RIGHT FEMUR 1 VIEW COMPARISON:  None. FINDINGS: Partially visualized displaced and angulated intertrochanteric fracture of the right hip. The femoral shaft is otherwise intact. The visualized knee joint is grossly maintained. IMPRESSION: Partially visualized displaced and angulated intertrochanteric fracture of the right hip. The femoral shaft is otherwise intact. Electronically Signed   By: Kristine Garbe M.D.   On: 06/12/2016 22:43   Dg Femur, Min 2 Views Right  Result Date: 06/13/2016 CLINICAL DATA:  80 year old  female undergoing right femur ORIF. Initial encounter. EXAM: DG C-ARM 61-120 MIN; RIGHT FEMUR 2 VIEWS COMPARISON:  Right femur series 12517. FINDINGS: 4 intraoperative fluoroscopic spot views of the right femur done demonstrating intra medullary rod placement with proximal interlocking dynamic hip screw and distal interlocking cortical screw. Improved alignment about the comminuted intertrochanteric fracture. Hardware appears intact. FLUOROSCOPY TIME:  2 minutes 22 seconds IMPRESSION: Right femur ORIF with improved intertrochanteric fracture alignment. Electronically Signed   By: Genevie Ann M.D.   On: 06/13/2016 14:06   Dg Femur Port, Min 2 Views Right  Result Date: 06/13/2016 CLINICAL DATA:  IM nail. EXAM: RIGHT FEMUR PORTABLE 2  VIEW COMPARISON:  06/12/2016 FINDINGS: Internal fixation across the comminuted right femoral intertrochanteric fracture. Fracture fragments remain mildly displaced. Decreasing varus angulation. No hardware complicating feature. IMPRESSION: Internal fixation across the comminuted right femoral intertrochanteric fracture with continued mild displacement of fracture fragments. Electronically Signed   By: Rolm Baptise M.D.   On: 06/13/2016 15:08    Assessment/Plan  Gait instability Will have patient work with PT/OT as tolerated to regain strength and restore function.  Fall precautions are in place.  Right intertrochanteric hip fracture S/p IM nail 06/13/16. Has orthopedic follow up. Continue norco 5-325 mg 1-2 tab q6h prn pain continue aspirin ec 325 mg daily for dvt prophylaxis. Patient would like to stay off narcotics if possible. Thus, start her on tylenol 650 mg q6h prn pain and if no relief, to provide norco. Continue vitamin d and calcium supplement. Will have her work with physical therapy and occupational therapy team to help with gait training and muscle strengthening exercises.fall precautions. Skin care. Encourage to be out of bed.   Acute blood loss anemia Post op, s/p  2 u PRBC transfusion. Monitor cbc  Hypokalemia Monitor bmp  Thrombocytopenia No signs of bleed. Monitor her platelet count with her on aspirin  Allergic rhinitis Continue loratadine daily and monitor. Continue her singulair.   gerd Stable, continue ranitidine 150 mg bid, monitor  Asthma No exacerbation, breathing stable. Continue singulair and advair with prn albuterol    Goals of care: short term rehabilitation   Labs/tests ordered: cbc, cmp 06/19/16  Family/ staff Communication: reviewed care plan with patient and nursing supervisor    Blanchie Serve, MD Internal Medicine [place], [place] [address] Cell Phone (Monday-Friday 8 am - 5 pm): [phone] On Call: [phone] and follow prompts after 5 pm and on weekends Office Phone: [phone] Office Fax: [phone]

## 2016-06-25 ENCOUNTER — Non-Acute Institutional Stay (SKILLED_NURSING_FACILITY): Payer: Medicare Other | Admitting: Family

## 2016-06-25 DIAGNOSIS — E440 Moderate protein-calorie malnutrition: Secondary | ICD-10-CM

## 2016-06-25 DIAGNOSIS — D649 Anemia, unspecified: Secondary | ICD-10-CM

## 2016-06-25 DIAGNOSIS — K5901 Slow transit constipation: Secondary | ICD-10-CM

## 2016-06-25 DIAGNOSIS — R600 Localized edema: Secondary | ICD-10-CM | POA: Diagnosis not present

## 2016-06-25 DIAGNOSIS — E8809 Other disorders of plasma-protein metabolism, not elsewhere classified: Secondary | ICD-10-CM | POA: Diagnosis not present

## 2016-06-25 NOTE — Progress Notes (Signed)
Location:  [facility] Room Number: 104  Place of Service:  SNF (31) Provider:Kapena Hamme FNP-C   [name], MD  Patient Care Team: Cassandria Anger, MD as PCP - General Aloha Gell, MD (Obstetrics and Gynecology) Gatha Mayer, MD as Consulting Physician (Gastroenterology)  Extended Emergency Contact Information Primary Emergency Contact: Alden Server [place] of [place] Phone: [phone] Relation: Friend  Code Status:  Full Code  Goals of care: Advanced Directive information Advanced Directives 06/14/2016  Does Patient Have a Medical Advance Directive? Yes  Type of Advance Directive [place]  Does patient want to make changes to medical advance directive? No - Patient declined  Copy of [place] in Chart? No - copy requested     Chief Complaint  Patient presents with  . Acute Visit    abnormal labs     HPI:  Pt is a 80 y.o. female seen today at [facility] for an acute visit for evaluation of abnormal labs. She is seen in her room today. She complains of right ankle swelling. She is post right hip arthroplasty post fall at home. Has noticed ankle seems to be swollen. She denies any pain with movement though states not bearing weight on leg. she also complain of constipation she has taken prune juices and stool softeners.  Her recent lab results showed Hgb 8.8, HCT 27.0, TP 5.4, ALb 2.98 ( 06/20/2016). She denies any fever or chills.     Past Medical History:  Diagnosis Date  . Asthma    Dr. Melvyn Novas  . Breast cyst    Left  . Cataract    Dr. Gershon Crane  . Complication of anesthesia    ' I HAD HALLUCINATIONS WHEN THEY DID MY HIP "  . GERD (gastroesophageal reflux disease)   . Hemorrhoids   . History of colonic polyps [name]   hx of adenomas (small) 1998  . OA (osteoarthritis)   . Osteopenia   . PAC (premature atrial contraction)   . Vitamin D deficiency     Past Surgical History:  Procedure Laterality Date  . ABDOMINAL HYSTERECTOMY     complete  . APPENDECTOMY    . BREAST CYST EXCISION    . COLONOSCOPY    . INTRAMEDULLARY (IM) NAIL INTERTROCHANTERIC Right 06/13/2016   Procedure: INTRAMEDULLARY (IM) NAIL INTERTROCHANTRIC;  Surgeon: Tania Ade, MD;  Location: [facility];  Service: Orthopedics;  Laterality: Right;  . TONSILLECTOMY      Allergies  Allergen Reactions  . Fosamax [Alendronate Sodium]     paresthesias  . Gabapentin     dizziness  . Lidocaine Swelling    Of face and lips.  Novocaine does same thing.  . Risedronate Sodium     Knee pain  . Shellfish Allergy Hives  . Statins     REACTION: pains    Allergies as of 06/25/2016      Reactions   Fosamax [alendronate Sodium]    paresthesias   Gabapentin    dizziness   Lidocaine Swelling   Of face and lips.  Novocaine does same thing.   Risedronate Sodium    Knee pain   Shellfish Allergy Hives   Statins    REACTION: pains      Medication List       Accurate as of 06/25/16  2:50 PM. Always use your most recent med list.          acetaminophen 650 MG  CR tablet Commonly known as:  TYLENOL Take 650 mg by mouth every 6 (six) hours as needed for pain.   albuterol 108 (90 Base) MCG/ACT inhaler Commonly known as:  PROVENTIL HFA;VENTOLIN HFA Inhale 1-2 puffs into the lungs every 6 (six) hours.   aspirin 325 MG EC tablet Take 1 tablet (325 mg total) by mouth daily with breakfast.   CALCIUM 500 PO Take 1 each by mouth daily.   Cholecalciferol 1000 units tablet Take 1,000 Units by mouth daily.   fluticasone-salmeterol 115-21 MCG/ACT inhaler Commonly known as:  ADVAIR HFA Inhale 2 puffs into the lungs 2 (two) times daily.   HYDROcodone-acetaminophen 5-325 MG tablet Commonly known as:  NORCO/VICODIN Take 1-2 tablets by mouth every 6 (six) hours as needed for moderate pain.   loratadine 10 MG tablet Commonly known as:  CLARITIN Take 10 mg by mouth daily.    MEGARED OMEGA-3 KRILL OIL 500 MG Caps Take 1 capsule by mouth every morning.   montelukast 10 MG tablet Commonly known as:  SINGULAIR Take 1 tablet (10 mg total) by mouth daily.   ranitidine 150 MG tablet Commonly known as:  ZANTAC Take 1 tablet (150 mg total) by mouth 2 (two) times daily.   vitamin E 400 UNIT capsule Commonly known as:  vitamin E Take 1 capsule (400 Units total) by mouth daily.       Review of Systems  Constitutional: Negative for activity change, appetite change, chills, fatigue and fever.  HENT: Negative for congestion, rhinorrhea, sinus pain, sinus pressure, sneezing and sore throat.   Eyes: Negative.   Respiratory: Negative for cough, chest tightness, shortness of breath and wheezing.   Cardiovascular: Negative for chest pain, palpitations and leg swelling.  Gastrointestinal: Positive for constipation. Negative for abdominal distention, abdominal pain, diarrhea, nausea and vomiting.  Genitourinary: Negative for dysuria, flank pain, frequency and urgency.  Musculoskeletal: Positive for gait problem.       Left ankle swelling   Skin: Negative for color change, pallor and rash.       Right hip surgical drsg   Neurological: Negative for dizziness, seizures, light-headedness and headaches.  Hematological: Does not bruise/bleed easily.  Psychiatric/Behavioral: Negative for agitation, confusion, hallucinations and sleep disturbance. The patient is not nervous/anxious.     Immunization History  Administered Date(s) Administered  . Influenza Split 05/16/2011, 05/07/2012  . Influenza Whole 04/29/2008, 04/27/2010  . Influenza, High Dose Seasonal PF 04/04/2016  . Influenza,inj,Quad PF,36+ Mos 03/17/2013, 03/23/2014, 03/30/2015  . PPD Test 06/15/2016  . Pneumococcal Conjugate-13 09/17/2013  . Pneumococcal Polysaccharide-23 02/02/2010  . Td 06/10/2007  . Zoster 10/28/2007   Pertinent  Health Maintenance Due  Topic Date Due  . COLONOSCOPY  10/21/2016  .  INFLUENZA VACCINE  Completed  . PNA vac Low Risk Adult  Completed   Fall Risk  09/27/2015 08/13/2014  Falls in the past year? No No      Vitals:   06/25/16 1130  BP: 132/76  Pulse: 70  Resp: 20  Temp: 98.4 F (36.9 C)  SpO2: 98%  Weight: 122 lb (55.3 kg)  Height: 5\' 3"  (1.6 m)   Body mass index is 21.61 kg/m. Physical Exam  Constitutional: She is oriented to person, place, and time. She appears well-developed and well-nourished. No distress.  HENT:  Head: Normocephalic.  Mouth/Throat: Oropharynx is clear and moist. No oropharyngeal exudate.  Eyes: Conjunctivae and EOM are normal. Pupils are equal, round, and reactive to light. Right eye exhibits no discharge. Left eye exhibits no  discharge. No scleral icterus.  Neck: Normal range of motion. No JVD present. No thyromegaly present.  Cardiovascular: Normal rate, regular rhythm, normal heart sounds and intact distal pulses.  Exam reveals no gallop and no friction rub.   No murmur heard. Pulmonary/Chest: Effort normal and breath sounds normal. No respiratory distress. She has no wheezes. She has no rales.  Abdominal: Soft. Bowel sounds are normal. She exhibits no distension. There is no tenderness. There is no rebound and no guarding.  Musculoskeletal: She exhibits no deformity.  Right ankle swelling none tender to touch and no redness.   Lymphadenopathy:    She has no cervical adenopathy.  Neurological: She is oriented to person, place, and time.  Skin: Skin is warm and dry. No rash noted. No erythema. No pallor.  Right hip surgical drsg dry, clean ands intact  Psychiatric: She has a normal mood and affect.    Labs reviewed:  Recent Labs  06/12/16 2132 06/14/16 0429 06/15/16 0446  NA 137 137 139  K 3.5 4.0 3.3*  CL 102 105 105  CO2 27 25 24   GLUCOSE 155* 116* 108*  BUN 18 11 7   CREATININE 0.80 0.81 0.69  CALCIUM 9.0 7.9* 8.3*    Recent Labs  02/15/16 1203 06/12/16 2132 06/14/16 0429 06/14/16 0823  06/14/16 2129 06/15/16 0446  WBC 7.9 12.0* 6.8  --   --  8.3  NEUTROABS 4.2 10.6*  --   --   --   --   HGB 12.6 11.1* 6.4* 6.3* 10.0* 9.4*  HCT 37.4 33.5* 19.5* 18.6* 29.5* 27.7*  MCV 93.0 96.0 94.7  --   --  87.4  PLT 301.0 300 184  --   --  141*   Lab Results  Component Value Date   TSH 2.37 02/15/2016   Lab Results  Component Value Date   HGBA1C 5.2 11/05/2014   Lab Results  Component Value Date   CHOL 256 (H) 03/23/2014   HDL 129.60 03/23/2014   LDLCALC 116 (H) 03/23/2014   LDLDIRECT 104.0 03/17/2013   TRIG 54.0 03/23/2014   CHOLHDL 2 03/23/2014    Assessment/Plan 1. Anemia, unspecified type Hgb 8.8, HCT 27.0 ( 06/20/2016).Previous Hgb 9.4. Asymptomatic.She is status post one unit of PRBC during recent right Arthroplasty. Will start ferrous sulfate 325 mg Tablet one by mouth daily. CBC 07/02/2016.   2. Hypoalbuminemia  ALb 2.98 ( 06/20/2016).Will consult RD for supplements. BMP 07/02/2016  3. Moderate protein-calorie malnutrition (HCC)  TP 5.4 ( 06/20/2016).RD consult for supplements.BMP 07/02/2016  4. Localized edema Right ankle/foot Non pitting edema, non tender to touch. No redness noted. Will obtain portable X-ray 3 views rule out fracture.   5. Constipation  Add miralax 17 gram PKT mix in 8 oz of fluid and drink daily Hold for diarrhea.   Family/ staff Communication: Reviewed plan of care with patient and facility Nurse supervisor.   Labs/tests ordered: CBC, BMP 07/02/2016

## 2016-07-16 ENCOUNTER — Non-Acute Institutional Stay (SKILLED_NURSING_FACILITY): Payer: Medicare Other | Admitting: Family

## 2016-07-16 DIAGNOSIS — R2681 Unsteadiness on feet: Secondary | ICD-10-CM | POA: Diagnosis not present

## 2016-07-16 DIAGNOSIS — D509 Iron deficiency anemia, unspecified: Secondary | ICD-10-CM | POA: Diagnosis not present

## 2016-07-16 DIAGNOSIS — J4520 Mild intermittent asthma, uncomplicated: Secondary | ICD-10-CM

## 2016-07-16 DIAGNOSIS — K219 Gastro-esophageal reflux disease without esophagitis: Secondary | ICD-10-CM

## 2016-07-16 DIAGNOSIS — J302 Other seasonal allergic rhinitis: Secondary | ICD-10-CM | POA: Diagnosis not present

## 2016-07-16 NOTE — Progress Notes (Signed)
Location:  [place] Room Number: 104 Place of Service:  SNF (31)  Provider:Berry Godsey FNP-C   PCP: [name], MD Patient Care Team: Cassandria Anger, MD as PCP - General Aloha Gell, MD (Obstetrics and Gynecology) Gatha Mayer, MD as Consulting Physician (Gastroenterology)  Extended Emergency Contact Information Primary Emergency Contact: Alden Server [place] Phone: [phone] Relation: Friend  Code Status: Full Code  Goals of care:  Advanced Directive information Advanced Directives 06/14/2016  Does Patient Have a Medical Advance Directive? Yes  Type of Advance Directive [place]  Does patient want to make changes to medical advance directive? No - Patient declined  Copy of [place] in Chart? No - copy requested     Allergies  Allergen Reactions  . Fosamax [Alendronate Sodium]     paresthesias  . Gabapentin     dizziness  . Lidocaine Swelling    Of face and lips.  Novocaine does same thing.  . Risedronate Sodium     Knee pain  . Shellfish Allergy Hives  . Statins     REACTION: pains    Chief Complaint  Patient presents with  . Discharge Note    HPI:  81 y.o. female seen today at [facility] for discharge home. She was here for short term Rehabilitation post  hospital admission from 06/12/16-06/15/16 with right intertrochanteric displaced hip fracture. She underwent IM nailing on 06/13/16. She had ABLA and received 2 units of PRBC transfusion. She has a medical history of Asthma, OA, GERD, Osteopenia, cataract among other conditions. She is seen in her room today. She requests for her Hydrocodone to be discontinued " I had hallucination after surgery I don't want any Narcotics". She states right hip pain under control with tylenol.She has worked well with PT/OT now stable for discharge home.She will be discharged home with Home health  PT/OT to continue with ROM, Exercise, Gait stability and muscle strengthening. She will require  DME FWW to allow her to maintain current level of independence with ADL's. She will also require a 3-1 bedside commode. Patient unable to safely and independently perform toileting transfer in home with unsteady gait and OA.Home health services will be arranged by facility social worker prior to discharge. Prescription medication will be written x 1 month then patient to follow up with PCP in 1-2 weeks.Facility staff report no new concerns.      Past Medical History:  Diagnosis Date  . Asthma    Dr. Melvyn Novas  . Breast cyst    Left  . Cataract    Dr. Gershon Crane  . Complication of anesthesia    ' I HAD HALLUCINATIONS WHEN THEY DID MY HIP "  . GERD (gastroesophageal reflux disease)   . Hemorrhoids   . History of colonic polyps [name]   hx of adenomas (small) 1998  . OA (osteoarthritis)   . Osteopenia   . PAC (premature atrial contraction)   . Vitamin D deficiency     Past Surgical History:  Procedure Laterality Date  . ABDOMINAL HYSTERECTOMY     complete  . APPENDECTOMY    . BREAST CYST EXCISION    . COLONOSCOPY    . INTRAMEDULLARY (IM) NAIL INTERTROCHANTERIC Right 06/13/2016   Procedure: INTRAMEDULLARY (IM) NAIL INTERTROCHANTRIC;  Surgeon: Tania Ade, MD;  Location: [place];  Service: Orthopedics;  Laterality: Right;  . TONSILLECTOMY        reports that she  has never smoked. She has never used smokeless tobacco. She reports that she drinks alcohol. She reports that she does not use drugs. Social History   Social History  . Marital status: Widowed    Spouse name: N/A  . Number of children: N/A  . Years of education: N/A   Occupational History  . retired    Social History Main Topics  . Smoking status: Never Smoker  . Smokeless tobacco: Never Used  . Alcohol use 0.0 oz/week     Comment: 3-4 times per week - vodka 1 drink  . Drug use: No  . Sexual activity: No   Other Topics  Concern  . Not on file   Social History Narrative   She lives alone in a [place] home.  No children.   Retired Education officer, museum.   Highest level of education:  Masters degree    Allergies  Allergen Reactions  . Fosamax [Alendronate Sodium]     paresthesias  . Gabapentin     dizziness  . Lidocaine Swelling    Of face and lips.  Novocaine does same thing.  . Risedronate Sodium     Knee pain  . Shellfish Allergy Hives  . Statins     REACTION: pains    Pertinent  Health Maintenance Due  Topic Date Due  . COLONOSCOPY  10/21/2016  . INFLUENZA VACCINE  Completed  . PNA vac Low Risk Adult  Completed    Medications: Allergies as of 07/16/2016      Reactions   Fosamax [alendronate Sodium]    paresthesias   Gabapentin    dizziness   Lidocaine Swelling   Of face and lips.  Novocaine does same thing.   Risedronate Sodium    Knee pain   Shellfish Allergy Hives   Statins    REACTION: pains      Medication List       Accurate as of 07/16/16  4:20 PM. Always use your most recent med list.          acetaminophen 650 MG CR tablet Commonly known as:  TYLENOL Take 650 mg by mouth every 6 (six) hours as needed for pain.   albuterol 108 (90 Base) MCG/ACT inhaler Commonly known as:  PROVENTIL HFA;VENTOLIN HFA Inhale 1-2 puffs into the lungs every 6 (six) hours.   aspirin 325 MG EC tablet Take 1 tablet (325 mg total) by mouth daily with breakfast.   CALCIUM 500 PO Take 1 each by mouth daily.   Cholecalciferol 1000 units tablet Take 1,000 Units by mouth daily.   fluticasone-salmeterol 115-21 MCG/ACT inhaler Commonly known as:  ADVAIR HFA Inhale 2 puffs into the lungs 2 (two) times daily.   HYDROcodone-acetaminophen 5-325 MG tablet Commonly known as:  NORCO/VICODIN Take 1-2 tablets by mouth every 6 (six) hours as needed for moderate pain.   loratadine 10 MG tablet Commonly known as:  CLARITIN Take 10 mg by mouth daily.   MEGARED OMEGA-3 KRILL OIL 500 MG Caps Take  1 capsule by mouth every morning.   montelukast 10 MG tablet Commonly known as:  SINGULAIR Take 1 tablet (10 mg total) by mouth daily.   ranitidine 150 MG tablet Commonly known as:  ZANTAC Take 1 tablet (150 mg total) by mouth 2 (two) times daily.   vitamin E 400 UNIT capsule Commonly known as:  vitamin E Take 1 capsule (400 Units total) by mouth daily.       Review of Systems  Constitutional: Negative for activity change, appetite  change, chills, fatigue and fever.  HENT: Negative for congestion, rhinorrhea, sinus pain, sinus pressure, sneezing and sore throat.   Eyes: Negative.   Respiratory: Negative for cough, chest tightness, shortness of breath and wheezing.   Cardiovascular: Negative for chest pain, palpitations and leg swelling.  Gastrointestinal: Negative for abdominal distention, abdominal pain, constipation, diarrhea, nausea and vomiting.  Endocrine: Negative for cold intolerance, heat intolerance, polydipsia, polyphagia and polyuria.  Genitourinary: Negative for dysuria, flank pain, frequency and urgency.  Musculoskeletal: Positive for gait problem.       Right hip pain  Skin: Negative for color change, pallor and rash.       Right hip incision   Neurological: Negative for dizziness, seizures, light-headedness and headaches.  Hematological: Does not bruise/bleed easily.  Psychiatric/Behavioral: Negative for agitation, confusion, hallucinations and sleep disturbance. The patient is not nervous/anxious.     Vitals:   07/16/16 1030  BP: (!) 142/82  Pulse: 78  Resp: 18  Temp: 98.6 F (37 C)  SpO2: 98%  Weight: 123 lb 9.6 oz (56.1 kg)  Height: 5\' 3"  (1.6 m)   Body mass index is 21.89 kg/m. Physical Exam  Constitutional: She is oriented to person, place, and time. She appears well-developed and well-nourished. No distress.  HENT:  Head: Normocephalic.  Mouth/Throat: Oropharynx is clear and moist. No oropharyngeal exudate.  Eyes: Conjunctivae and EOM are  normal. Pupils are equal, round, and reactive to light. Right eye exhibits no discharge. Left eye exhibits no discharge. No scleral icterus.  Neck: Normal range of motion. No JVD present. No thyromegaly present.  Cardiovascular: Normal rate, regular rhythm, normal heart sounds and intact distal pulses.  Exam reveals no gallop and no friction rub.   No murmur heard. Pulmonary/Chest: Effort normal and breath sounds normal. No respiratory distress. She has no wheezes. She has no rales.  Abdominal: Soft. Bowel sounds are normal. She exhibits no distension. There is no tenderness. There is no rebound and no guarding.  Musculoskeletal: She exhibits no deformity.  Moves x 4 extremities. Right hip limited ROM due to pain.Unsteady gait. Bilateral lower extremities Knee high Ted hose in place.   Lymphadenopathy:    She has no cervical adenopathy.  Neurological: She is oriented to person, place, and time.  Skin: Skin is warm and dry. No rash noted. No erythema. No pallor.  Right hip surgical healed without any signs of infections.   Psychiatric: She has a normal mood and affect.   Labs reviewed: Basic Metabolic Panel:  Recent Labs  06/12/16 2132 06/14/16 0429 06/15/16 0446  NA 137 137 139  K 3.5 4.0 3.3*  CL 102 105 105  CO2 27 25 24   GLUCOSE 155* 116* 108*  BUN 18 11 7   CREATININE 0.80 0.81 0.69  CALCIUM 9.0 7.9* 8.3*   CBC:  Recent Labs  02/15/16 1203 06/12/16 2132 06/14/16 0429 06/14/16 0823 06/14/16 2129 06/15/16 0446  WBC 7.9 12.0* 6.8  --   --  8.3  NEUTROABS 4.2 10.6*  --   --   --   --   HGB 12.6 11.1* 6.4* 6.3* 10.0* 9.4*  HCT 37.4 33.5* 19.5* 18.6* 29.5* 27.7*  MCV 93.0 96.0 94.7  --   --  87.4  PLT 301.0 300 184  --   --  141*   Assessment/Plan:   1. Unsteady gait Status post short term Rehabilitation post hospital admission from 06/12/16-06/15/16 with right intertrochanteric displaced hip fracture. She underwent IM nailing on 06/13/16.She has worked well with PT/  OT.  Will discharge home PT/OT to continue with ROM, Exercise, Gait stability and muscle strengthening. She will require  DME FWW to allow her to maintain current level of independence with ADL's. She will also require a 3-1 bedside commode. Patient unable to safely and independently perform toileting transfer in home with unsteady gait and OA.Fall and safety precautions.   2. Mild intermittent asthma without complication Under controlled. Continue on Albuterol, Advair and Loratadine.    3. Gastroesophageal reflux disease without esophagitis Continue Ranitidine.   4. Chronic seasonal allergic rhinitis, unspecified trigger Symptoms under control. Continue on Loratadine.   5. Iron deficiency anemia, unspecified iron deficiency anemia type Status post hospital admission from 06/12/16-06/15/16 with right intertrochanteric displaced hip fracture. She underwent IM nailing on 06/13/16.She had 2 units of PRBC transfusion. Recent Hgb 9.4. Continue on Ferrous sulfate. CBC in 1-2 weeks with PCP.     Patient is being discharged with the following home health services:    - PT/OT for ROM, Exercise, Gait stability and muscle strengthening.   Patient is being discharged with the following durable medical equipment:   - FWW to allow her to maintain current level of independence with ADL's.   - 3-1bedside commode. Patient unable to safely and independently perform toileting transfer in home with unsteady gait and OA.   Patient has been advised to f/u with their PCP in 1-2 weeks to for a transitions of care visit.  Social services at their facility was responsible for arranging this appointment.  Pt was provided with adequate prescriptions of noncontrolled medications to reach the scheduled appointment .  For controlled substances, a limited supply was provided as appropriate for the individual patient.  If the pt normally receives these medications from a pain clinic or has a contract with another physician, these  medications should be received from that clinic or physician only).    Future labs/tests needed:  CBC, BMP in 1-2 week with PCP

## 2016-07-27 ENCOUNTER — Encounter: Payer: Self-pay | Admitting: Internal Medicine

## 2016-07-27 ENCOUNTER — Ambulatory Visit (INDEPENDENT_AMBULATORY_CARE_PROVIDER_SITE_OTHER): Payer: Medicare Other | Admitting: Internal Medicine

## 2016-07-27 ENCOUNTER — Other Ambulatory Visit (INDEPENDENT_AMBULATORY_CARE_PROVIDER_SITE_OTHER): Payer: Medicare Other

## 2016-07-27 DIAGNOSIS — I2510 Atherosclerotic heart disease of native coronary artery without angina pectoris: Secondary | ICD-10-CM

## 2016-07-27 DIAGNOSIS — W19XXXS Unspecified fall, sequela: Secondary | ICD-10-CM

## 2016-07-27 DIAGNOSIS — D509 Iron deficiency anemia, unspecified: Secondary | ICD-10-CM | POA: Diagnosis not present

## 2016-07-27 DIAGNOSIS — E559 Vitamin D deficiency, unspecified: Secondary | ICD-10-CM

## 2016-07-27 DIAGNOSIS — J4520 Mild intermittent asthma, uncomplicated: Secondary | ICD-10-CM | POA: Diagnosis not present

## 2016-07-27 DIAGNOSIS — M8000XS Age-related osteoporosis with current pathological fracture, unspecified site, sequela: Secondary | ICD-10-CM

## 2016-07-27 LAB — CBC WITH DIFFERENTIAL/PLATELET
BASOS ABS: 0 10*3/uL (ref 0.0–0.1)
BASOS PCT: 0.4 % (ref 0.0–3.0)
EOS ABS: 0.1 10*3/uL (ref 0.0–0.7)
Eosinophils Relative: 1.5 % (ref 0.0–5.0)
HEMATOCRIT: 35.1 % — AB (ref 36.0–46.0)
Hemoglobin: 11.8 g/dL — ABNORMAL LOW (ref 12.0–15.0)
LYMPHS PCT: 27.8 % (ref 12.0–46.0)
Lymphs Abs: 2.1 10*3/uL (ref 0.7–4.0)
MCHC: 33.6 g/dL (ref 30.0–36.0)
MCV: 90.4 fl (ref 78.0–100.0)
MONOS PCT: 5.8 % (ref 3.0–12.0)
Monocytes Absolute: 0.4 10*3/uL (ref 0.1–1.0)
NEUTROS ABS: 4.8 10*3/uL (ref 1.4–7.7)
Neutrophils Relative %: 64.5 % (ref 43.0–77.0)
Platelets: 284 10*3/uL (ref 150.0–400.0)
RBC: 3.89 Mil/uL (ref 3.87–5.11)
RDW: 15.3 % (ref 11.5–15.5)
WBC: 7.5 10*3/uL (ref 4.0–10.5)

## 2016-07-27 LAB — BASIC METABOLIC PANEL
BUN: 11 mg/dL (ref 6–23)
CHLORIDE: 102 meq/L (ref 96–112)
CO2: 27 mEq/L (ref 19–32)
Calcium: 9.4 mg/dL (ref 8.4–10.5)
Creatinine, Ser: 0.87 mg/dL (ref 0.40–1.20)
GFR: 80.88 mL/min (ref >60.00)
GLUCOSE: 136 mg/dL — AB (ref 70–99)
POTASSIUM: 3.9 meq/L (ref 3.5–5.1)
Sodium: 137 mEq/L (ref 135–145)

## 2016-07-27 NOTE — Assessment & Plan Note (Signed)
ASA

## 2016-07-27 NOTE — Assessment & Plan Note (Signed)
Vit D 

## 2016-07-27 NOTE — Assessment & Plan Note (Signed)
Vit d Cont w/PT

## 2016-07-27 NOTE — Assessment & Plan Note (Signed)
Advair, Singulair

## 2016-07-27 NOTE — Progress Notes (Signed)
Pre visit review using our clinic review tool, if applicable. No additional management support is needed unless otherwise documented below in the visit note. 

## 2016-07-27 NOTE — Progress Notes (Signed)
Subjective:  Patient ID: Cindy Meza, female    DOB: 07-Mar-1936  Age: 81 y.o. MRN: [id number]  CC: No chief complaint on file.   HPI RABEKA BOUNDY presents for R hip fx (12/18) - back at home s/p intratroch nail  F/u asthma, GERD f/u  Outpatient Medications Prior to Visit  Medication Sig Dispense Refill  . acetaminophen (TYLENOL) 650 MG CR tablet Take 650 mg by mouth every 6 (six) hours as needed for pain.    Marland Kitchen albuterol (PROVENTIL HFA;VENTOLIN HFA) 108 (90 Base) MCG/ACT inhaler Inhale 1-2 puffs into the lungs every 6 (six) hours.    . Calcium-Magnesium-Vitamin D (CALCIUM 500 PO) Take 1 each by mouth daily.    . Cholecalciferol 1000 UNITS tablet Take 1,000 Units by mouth daily.      . fluticasone-salmeterol (ADVAIR HFA) 115-21 MCG/ACT inhaler Inhale 2 puffs into the lungs 2 (two) times daily. 1 Inhaler 11  . loratadine (CLARITIN) 10 MG tablet Take 10 mg by mouth daily.     Marland Kitchen MEGARED OMEGA-3 KRILL OIL 500 MG CAPS Take 1 capsule by mouth every morning. 100 capsule 3  . montelukast (SINGULAIR) 10 MG tablet Take 1 tablet (10 mg total) by mouth daily. 30 tablet 11  . ranitidine (ZANTAC) 150 MG tablet Take 1 tablet (150 mg total) by mouth 2 (two) times daily. 60 tablet 11  . vitamin E (VITAMIN E) 400 UNIT capsule Take 1 capsule (400 Units total) by mouth daily. 100 capsule 1  . aspirin EC 325 MG EC tablet Take 1 tablet (325 mg total) by mouth daily with breakfast. 30 tablet 0   No facility-administered medications prior to visit.     ROS Review of Systems  Constitutional: Negative for activity change, appetite change, chills, fatigue and unexpected weight change.  HENT: Negative for congestion, mouth sores and sinus pressure.   Eyes: Negative for visual disturbance.  Respiratory: Negative for cough and chest tightness.   Gastrointestinal: Negative for abdominal pain and nausea.  Genitourinary: Negative for difficulty urinating, frequency and vaginal pain.  Musculoskeletal:  Positive for gait problem. Negative for back pain.  Skin: Negative for pallor and rash.  Neurological: Negative for dizziness, tremors, weakness, numbness and headaches.  Psychiatric/Behavioral: Negative for confusion and sleep disturbance.    Objective:  BP 112/70   Pulse (!) 101   Wt 117 lb (53.1 kg)   SpO2 94%   BMI 20.73 kg/m   BP Readings from Last 3 Encounters:  07/27/16 112/70  07/16/16 (!) 142/82  06/25/16 132/76    Wt Readings from Last 3 Encounters:  07/27/16 117 lb (53.1 kg)  07/16/16 123 lb 9.6 oz (56.1 kg)  06/25/16 122 lb (55.3 kg)    Physical Exam  Constitutional: She appears well-developed. No distress.  HENT:  Head: Normocephalic.  Right Ear: External ear normal.  Left Ear: External ear normal.  Nose: Nose normal.  Mouth/Throat: Oropharynx is clear and moist.  Eyes: Conjunctivae are normal. Pupils are equal, round, and reactive to light. Right eye exhibits no discharge. Left eye exhibits no discharge.  Neck: Normal range of motion. Neck supple. No JVD present. No tracheal deviation present. No thyromegaly present.  Cardiovascular: Normal rate, regular rhythm and normal heart sounds.   Pulmonary/Chest: No stridor. No respiratory distress. She has no wheezes.  Abdominal: Soft. Bowel sounds are normal. She exhibits no distension and no mass. There is no tenderness. There is no rebound and no guarding.  Musculoskeletal: She exhibits tenderness. She exhibits no  edema.  Lymphadenopathy:    She has no cervical adenopathy.  Neurological: She displays normal reflexes. No cranial nerve deficit. She exhibits normal muscle tone. Coordination abnormal.  Skin: No rash noted. No erythema.  Psychiatric: She has a normal mood and affect. Her behavior is normal. Judgment and thought content normal.  R hip post-op pain Walker  Lab Results  Component Value Date   WBC 8.3 06/15/2016   HGB 9.4 (L) 06/15/2016   HCT 27.7 (L) 06/15/2016   PLT 141 (L) 06/15/2016   GLUCOSE  108 (H) 06/15/2016   CHOL 256 (H) 03/23/2014   TRIG 54.0 03/23/2014   HDL 129.60 03/23/2014   LDLDIRECT 104.0 03/17/2013   LDLCALC 116 (H) 03/23/2014   ALT 10 03/30/2015   AST 17 03/30/2015   NA 139 06/15/2016   K 3.3 (L) 06/15/2016   CL 105 06/15/2016   CREATININE 0.69 06/15/2016   BUN 7 06/15/2016   CO2 24 06/15/2016   TSH 2.37 02/15/2016   INR 1.04 06/12/2016   HGBA1C 5.2 11/05/2014    Dg Chest 1 View  Result Date: 06/12/2016 CLINICAL DATA:  81 y/o  F; status post fall. EXAM: CHEST 1 VIEW COMPARISON:  01/16/2006 chest radiograph FINDINGS: Stable cardiac silhouette within normal limits given projection and technique. Aortic atherosclerosis with arch calcification. Clear lungs. No acute osseous abnormality is evident IMPRESSION: No active disease. Electronically Signed   By: Kristine Garbe M.D.   On: 06/12/2016 22:41   Dg C-arm 1-60 Min  Result Date: 06/13/2016 CLINICAL DATA:  81 year old female undergoing right femur ORIF. Initial encounter. EXAM: DG C-ARM 61-120 MIN; RIGHT FEMUR 2 VIEWS COMPARISON:  Right femur series 12517. FINDINGS: 4 intraoperative fluoroscopic spot views of the right femur done demonstrating intra medullary rod placement with proximal interlocking dynamic hip screw and distal interlocking cortical screw. Improved alignment about the comminuted intertrochanteric fracture. Hardware appears intact. FLUOROSCOPY TIME:  2 minutes 22 seconds IMPRESSION: Right femur ORIF with improved intertrochanteric fracture alignment. Electronically Signed   By: Genevie Ann M.D.   On: 06/13/2016 14:06   Dg Hip Unilat  With Pelvis 2-3 Views Right  Result Date: 06/12/2016 CLINICAL DATA:  Initial evaluation for acute trauma. EXAM: DG HIP (WITH OR WITHOUT PELVIS) 2-3V RIGHT COMPARISON:  None. FINDINGS: There is an acute mildly displaced intertrochanteric fracture of the right hip. Right femoral head remains normally position within the acetabulum. Femoral head height preserved.  Limited views of the left hip demonstrate no acute osseous abnormality. 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 Bony pelvis intact. SI joints approximated. IMPRESSION: Acute mildly displaced intertrochanteric fracture of the right hip. Electronically Signed   By: Jeannine Boga M.D.   On: 06/12/2016 21:11   Dg Femur 1v Right  Result Date: 06/12/2016 CLINICAL DATA:  81 y/o  F; hip fracture EXAM: RIGHT FEMUR 1 VIEW COMPARISON:  None. FINDINGS: Partially visualized displaced and angulated intertrochanteric fracture of the right hip. The femoral shaft is otherwise intact. The visualized knee joint is grossly maintained. IMPRESSION: Partially visualized displaced and angulated intertrochanteric fracture of the right hip. The femoral shaft is otherwise intact. Electronically Signed   By: Kristine Garbe M.D.   On: 06/12/2016 22:43   Dg Femur, Min 2 Views Right  Result Date: 06/13/2016 CLINICAL DATA:  81 year old female undergoing right femur ORIF. Initial encounter. EXAM: DG C-ARM 61-120 MIN; RIGHT FEMUR 2 VIEWS COMPARISON:  Right femur series 12517. FINDINGS: 4 intraoperative fluoroscopic spot views of the right femur done demonstrating intra medullary  rod placement with proximal interlocking dynamic hip screw and distal interlocking cortical screw. Improved alignment about the comminuted intertrochanteric fracture. Hardware appears intact. FLUOROSCOPY TIME:  2 minutes 22 seconds IMPRESSION: Right femur ORIF with improved intertrochanteric fracture alignment. Electronically Signed   By: Genevie Ann M.D.   On: 06/13/2016 14:06   Dg Femur Port, Min 2 Views Right  Result Date: 06/13/2016 CLINICAL DATA:  IM nail. EXAM: RIGHT FEMUR PORTABLE 2 VIEW COMPARISON:  06/12/2016 FINDINGS: Internal fixation across the comminuted right femoral intertrochanteric fracture. Fracture fragments remain mildly displaced. Decreasing varus angulation. No hardware complicating feature. IMPRESSION:  Internal fixation across the comminuted right femoral intertrochanteric fracture with continued mild displacement of fracture fragments. Electronically Signed   By: Rolm Baptise M.D.   On: 06/13/2016 15:08    Assessment & Plan:   There are no diagnoses linked to this encounter. I am having Ms. Nicole Kindred maintain her loratadine, Cholecalciferol, Calcium-Magnesium-Vitamin D (CALCIUM 500 PO), vitamin E, MEGARED OMEGA-3 KRILL OIL, ranitidine, fluticasone-salmeterol, montelukast, albuterol, acetaminophen, ferrous sulfate, aspirin, and polyethylene glycol.  Meds ordered this encounter  Medications  . ferrous sulfate 325 (65 FE) MG tablet    Sig: Take 1 tablet by mouth daily.    Refill:  0  . aspirin 81 MG tablet    Sig: Take 81 mg by mouth daily.  . polyethylene glycol (MIRALAX / GLYCOLAX) packet    Sig: Take 17 g by mouth daily.     Follow-up: No Follow-up on file.  [name], MD

## 2016-07-27 NOTE — Assessment & Plan Note (Signed)
S/p transfusion post- R hip surgery

## 2016-07-27 NOTE — Assessment & Plan Note (Signed)
12/17 R hip fx

## 2016-08-01 ENCOUNTER — Ambulatory Visit: Payer: Medicare Other | Admitting: Internal Medicine

## 2016-08-15 ENCOUNTER — Other Ambulatory Visit: Payer: Self-pay

## 2016-08-15 MED ORDER — FERROUS SULFATE 325 (65 FE) MG PO TABS: 325.0000 mg | ORAL_TABLET | Freq: Every day | ORAL | 3 refills | Status: DC

## 2016-08-15 MED ORDER — POLYETHYLENE GLYCOL 3350 17 G PO PACK: 17.0000 g | PACK | Freq: Every day | ORAL | 1 refills | Status: DC

## 2016-08-15 NOTE — Progress Notes (Signed)
Refill sent to pharmacy.   

## 2016-08-22 ENCOUNTER — Ambulatory Visit: Payer: Medicare Other | Admitting: Internal Medicine

## 2016-09-14 ENCOUNTER — Other Ambulatory Visit: Payer: Self-pay | Admitting: *Deleted

## 2016-09-14 MED ORDER — RANITIDINE HCL 150 MG PO TABS: 150.0000 mg | ORAL_TABLET | Freq: Two times a day (BID) | ORAL | 5 refills | Status: DC

## 2016-10-24 ENCOUNTER — Ambulatory Visit (INDEPENDENT_AMBULATORY_CARE_PROVIDER_SITE_OTHER): Payer: Medicare Other | Admitting: Internal Medicine

## 2016-10-24 ENCOUNTER — Encounter: Payer: Self-pay | Admitting: Internal Medicine

## 2016-10-24 ENCOUNTER — Other Ambulatory Visit (INDEPENDENT_AMBULATORY_CARE_PROVIDER_SITE_OTHER): Payer: Medicare Other

## 2016-10-24 DIAGNOSIS — S72001S Fracture of unspecified part of neck of right femur, sequela: Secondary | ICD-10-CM | POA: Diagnosis not present

## 2016-10-24 DIAGNOSIS — I2510 Atherosclerotic heart disease of native coronary artery without angina pectoris: Secondary | ICD-10-CM

## 2016-10-24 DIAGNOSIS — D509 Iron deficiency anemia, unspecified: Secondary | ICD-10-CM

## 2016-10-24 LAB — BASIC METABOLIC PANEL
BUN: 15 mg/dL (ref 6–23)
CHLORIDE: 105 meq/L (ref 96–112)
CO2: 29 meq/L (ref 19–32)
Calcium: 9.1 mg/dL (ref 8.4–10.5)
Creatinine, Ser: 0.87 mg/dL (ref 0.40–1.20)
GFR: 80.82 mL/min (ref >60.00)
Glucose, Bld: 122 mg/dL — ABNORMAL HIGH (ref 70–99)
POTASSIUM: 4.1 meq/L (ref 3.5–5.1)
Sodium: 138 mEq/L (ref 135–145)

## 2016-10-24 LAB — CBC WITH DIFFERENTIAL/PLATELET
BASOS ABS: 0.1 10*3/uL (ref 0.0–0.1)
Basophils Relative: 1.3 % (ref 0.0–3.0)
EOS ABS: 0.3 10*3/uL (ref 0.0–0.7)
Eosinophils Relative: 3.9 % (ref 0.0–5.0)
HEMATOCRIT: 34.1 % — AB (ref 36.0–46.0)
Hemoglobin: 11.4 g/dL — ABNORMAL LOW (ref 12.0–15.0)
LYMPHS ABS: 3.3 10*3/uL (ref 0.7–4.0)
LYMPHS PCT: 44.7 % (ref 12.0–46.0)
MCHC: 33.5 g/dL (ref 30.0–36.0)
MCV: 93.8 fl (ref 78.0–100.0)
MONOS PCT: 7.9 % (ref 3.0–12.0)
Monocytes Absolute: 0.6 10*3/uL (ref 0.1–1.0)
NEUTROS PCT: 42.2 % — AB (ref 43.0–77.0)
Neutro Abs: 3.1 10*3/uL (ref 1.4–7.7)
PLATELETS: 282 10*3/uL (ref 150.0–400.0)
RBC: 3.63 Mil/uL — AB (ref 3.87–5.11)
RDW: 12.6 % (ref 11.5–15.5)
WBC: 7.3 10*3/uL (ref 4.0–10.5)

## 2016-10-24 LAB — IBC PANEL
IRON: 71 ug/dL (ref 42–145)
SATURATION RATIOS: 26.8 % (ref 20.0–50.0)
TRANSFERRIN: 189 mg/dL — AB (ref 212.0–360.0)

## 2016-10-24 MED ORDER — POLYETHYLENE GLYCOL 3350 17 G PO PACK: 17.0000 g | PACK | Freq: Every day | ORAL | 11 refills | Status: DC

## 2016-10-24 NOTE — Assessment & Plan Note (Signed)
Labs On PO iron 

## 2016-10-24 NOTE — Progress Notes (Signed)
Pre visit review using our clinic review tool, if applicable. No additional management support is needed unless otherwise documented below in the visit note. 

## 2016-10-24 NOTE — Patient Instructions (Signed)
MC well w/Jill 

## 2016-10-24 NOTE — Progress Notes (Signed)
Subjective:  Patient ID: Cindy Meza, female    DOB: May 02, 1936  Age: 81 y.o. MRN: [id number]  CC: No chief complaint on file.   HPI Cindy Meza presents for R hip fx - intermedullary nail 06/13/17 s/p and anemia f/u F/u asthma, HTN. C/o constipation   Outpatient Medications Prior to Visit  Medication Sig Dispense Refill  . acetaminophen (TYLENOL) 650 MG CR tablet Take 650 mg by mouth every 6 (six) hours as needed for pain.    Marland Kitchen albuterol (PROVENTIL HFA;VENTOLIN HFA) 108 (90 Base) MCG/ACT inhaler Inhale 1-2 puffs into the lungs every 6 (six) hours.    Marland Kitchen aspirin 81 MG tablet Take 81 mg by mouth daily.    . Calcium-Magnesium-Vitamin D (CALCIUM 500 PO) Take 1 each by mouth daily.    . Cholecalciferol 1000 UNITS tablet Take 1,000 Units by mouth daily.      . ferrous sulfate 325 (65 FE) MG tablet Take 1 tablet (325 mg total) by mouth daily. 30 tablet 3  . fluticasone-salmeterol (ADVAIR HFA) 115-21 MCG/ACT inhaler Inhale 2 puffs into the lungs 2 (two) times daily. 1 Inhaler 11  . loratadine (CLARITIN) 10 MG tablet Take 10 mg by mouth daily.     Marland Kitchen MEGARED OMEGA-3 KRILL OIL 500 MG CAPS Take 1 capsule by mouth every morning. 100 capsule 3  . montelukast (SINGULAIR) 10 MG tablet Take 1 tablet (10 mg total) by mouth daily. 30 tablet 11  . polyethylene glycol (MIRALAX / GLYCOLAX) packet Take 17 g by mouth daily. 14 each 1  . ranitidine (ZANTAC) 150 MG tablet Take 1 tablet (150 mg total) by mouth 2 (two) times daily. 60 tablet 5  . vitamin E (VITAMIN E) 400 UNIT capsule Take 1 capsule (400 Units total) by mouth daily. 100 capsule 1   No facility-administered medications prior to visit.     ROS Review of Systems  Constitutional: Positive for fatigue. Negative for activity change, appetite change, chills and unexpected weight change.  HENT: Negative for congestion, mouth sores and sinus pressure.   Eyes: Negative for visual disturbance.  Respiratory: Negative for cough and chest  tightness.   Gastrointestinal: Negative for abdominal pain and nausea.  Genitourinary: Negative for difficulty urinating, frequency and vaginal pain.  Musculoskeletal: Positive for arthralgias and gait problem. Negative for back pain.  Skin: Negative for pallor and rash.  Neurological: Negative for dizziness, tremors, weakness, numbness and headaches.  Psychiatric/Behavioral: Negative for confusion, sleep disturbance and suicidal ideas. The patient is not nervous/anxious.     Objective:  BP 118/72 (BP Location: Left Arm, Patient Position: Sitting, Cuff Size: Normal)   Pulse 92   Temp 98.7 F (37.1 C) (Oral)   Ht 5\' 3"  (1.6 m)   Wt 114 lb 1.3 oz (51.7 kg)   SpO2 99%   BMI 20.21 kg/m   BP Readings from Last 3 Encounters:  10/24/16 118/72  07/27/16 112/70  07/16/16 (!) 142/82    Wt Readings from Last 3 Encounters:  10/24/16 114 lb 1.3 oz (51.7 kg)  07/27/16 117 lb (53.1 kg)  07/16/16 123 lb 9.6 oz (56.1 kg)    Physical Exam  Constitutional: She appears well-developed. No distress.  HENT:  Head: Normocephalic.  Right Ear: External ear normal.  Left Ear: External ear normal.  Nose: Nose normal.  Mouth/Throat: Oropharynx is clear and moist.  Eyes: Conjunctivae are normal. Pupils are equal, round, and reactive to light. Right eye exhibits no discharge. Left eye exhibits no discharge.  Neck:  Normal range of motion. Neck supple. No JVD present. No tracheal deviation present. No thyromegaly present.  Cardiovascular: Normal rate, regular rhythm and normal heart sounds.   Pulmonary/Chest: No stridor. No respiratory distress. She has no wheezes.  Abdominal: Soft. Bowel sounds are normal. She exhibits no distension and no mass. There is no tenderness. There is no rebound and no guarding.  Musculoskeletal: She exhibits no edema or tenderness.  Lymphadenopathy:    She has no cervical adenopathy.  Neurological: She displays normal reflexes. No cranial nerve deficit. She exhibits normal  muscle tone. Coordination normal.  Skin: No rash noted. No erythema.  Psychiatric: She has a normal mood and affect. Her behavior is normal. Judgment and thought content normal.    Lab Results  Component Value Date   WBC 7.5 07/27/2016   HGB 11.8 (L) 07/27/2016   HCT 35.1 (L) 07/27/2016   PLT 284.0 07/27/2016   GLUCOSE 136 (H) 07/27/2016   CHOL 256 (H) 03/23/2014   TRIG 54.0 03/23/2014   HDL 129.60 03/23/2014   LDLDIRECT 104.0 03/17/2013   LDLCALC 116 (H) 03/23/2014   ALT 10 03/30/2015   AST 17 03/30/2015   NA 137 07/27/2016   K 3.9 07/27/2016   CL 102 07/27/2016   CREATININE 0.87 07/27/2016   BUN 11 07/27/2016   CO2 27 07/27/2016   TSH 2.37 02/15/2016   INR 1.04 06/12/2016   HGBA1C 5.2 11/05/2014    Dg Chest 1 View  Result Date: 06/12/2016 CLINICAL DATA:  81 y/o  F; status post fall. EXAM: CHEST 1 VIEW COMPARISON:  01/16/2006 chest radiograph FINDINGS: Stable cardiac silhouette within normal limits given projection and technique. Aortic atherosclerosis with arch calcification. Clear lungs. No acute osseous abnormality is evident IMPRESSION: No active disease. Electronically Signed   By: Kristine Garbe M.D.   On: 06/12/2016 22:41   Dg C-arm 1-60 Min  Result Date: 06/13/2016 CLINICAL DATA:  81 year old female undergoing right femur ORIF. Initial encounter. EXAM: DG C-ARM 61-120 MIN; RIGHT FEMUR 2 VIEWS COMPARISON:  Right femur series 12517. FINDINGS: 4 intraoperative fluoroscopic spot views of the right femur done demonstrating intra medullary rod placement with proximal interlocking dynamic hip screw and distal interlocking cortical screw. Improved alignment about the comminuted intertrochanteric fracture. Hardware appears intact. FLUOROSCOPY TIME:  2 minutes 22 seconds IMPRESSION: Right femur ORIF with improved intertrochanteric fracture alignment. Electronically Signed   By: Genevie Ann M.D.   On: 06/13/2016 14:06   Dg Hip Unilat  With Pelvis 2-3 Views Right  Result  Date: 06/12/2016 CLINICAL DATA:  Initial evaluation for acute trauma. EXAM: DG HIP (WITH OR WITHOUT PELVIS) 2-3V RIGHT COMPARISON:  None. FINDINGS: There is an acute mildly displaced intertrochanteric fracture of the right hip. Right femoral head remains normally position within the acetabulum. Femoral head height preserved. Limited views of the left hip demonstrate no acute osseous abnormality. 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 Bony pelvis intact. SI joints approximated. IMPRESSION: Acute mildly displaced intertrochanteric fracture of the right hip. Electronically Signed   By: Jeannine Boga M.D.   On: 06/12/2016 21:11   Dg Femur 1v Right  Result Date: 06/12/2016 CLINICAL DATA:  81 y/o  F; hip fracture EXAM: RIGHT FEMUR 1 VIEW COMPARISON:  None. FINDINGS: Partially visualized displaced and angulated intertrochanteric fracture of the right hip. The femoral shaft is otherwise intact. The visualized knee joint is grossly maintained. IMPRESSION: Partially visualized displaced and angulated intertrochanteric fracture of the right hip. The femoral shaft is otherwise intact. Electronically Signed  By: Kristine Garbe M.D.   On: 06/12/2016 22:43   Dg Femur, Min 2 Views Right  Result Date: 06/13/2016 CLINICAL DATA:  81 year old female undergoing right femur ORIF. Initial encounter. EXAM: DG C-ARM 61-120 MIN; RIGHT FEMUR 2 VIEWS COMPARISON:  Right femur series 12517. FINDINGS: 4 intraoperative fluoroscopic spot views of the right femur done demonstrating intra medullary rod placement with proximal interlocking dynamic hip screw and distal interlocking cortical screw. Improved alignment about the comminuted intertrochanteric fracture. Hardware appears intact. FLUOROSCOPY TIME:  2 minutes 22 seconds IMPRESSION: Right femur ORIF with improved intertrochanteric fracture alignment. Electronically Signed   By: Genevie Ann M.D.   On: 06/13/2016 14:06   Dg Femur Port, Min 2 Views  Right  Result Date: 06/13/2016 CLINICAL DATA:  IM nail. EXAM: RIGHT FEMUR PORTABLE 2 VIEW COMPARISON:  06/12/2016 FINDINGS: Internal fixation across the comminuted right femoral intertrochanteric fracture. Fracture fragments remain mildly displaced. Decreasing varus angulation. No hardware complicating feature. IMPRESSION: Internal fixation across the comminuted right femoral intertrochanteric fracture with continued mild displacement of fracture fragments. Electronically Signed   By: Rolm Baptise M.D.   On: 06/13/2016 15:08    Assessment & Plan:   There are no diagnoses linked to this encounter. I am having Ms. Nicole Kindred maintain her loratadine, Cholecalciferol, Calcium-Magnesium-Vitamin D (CALCIUM 500 PO), vitamin E, MEGARED OMEGA-3 KRILL OIL, fluticasone-salmeterol, montelukast, albuterol, acetaminophen, aspirin, polyethylene glycol, ferrous sulfate, and ranitidine.  No orders of the defined types were placed in this encounter.    Follow-up: No Follow-up on file.  [name], MD

## 2016-10-24 NOTE — Assessment & Plan Note (Signed)
Dr Tamera Punt R hip fx - intermedullary nail 06/13/17 s/p  Kasandra Knudsen

## 2016-11-27 ENCOUNTER — Encounter: Payer: Self-pay | Admitting: Internal Medicine

## 2017-01-01 ENCOUNTER — Telehealth: Payer: Self-pay | Admitting: Internal Medicine

## 2017-01-01 NOTE — Telephone Encounter (Signed)
°  Patient Name: Cindy Meza  DOB: [birth year]    Initial Comment Caller is feeling jittery and tightness in her chest. Feeling tired also.    Nurse Assessment  Nurse: Julien Girt, RN, Almyra Free Date/Time Eilene Ghazi Time): 01/01/2017 3:16:04 PM  Confirm and document reason for call. If symptomatic, describe symptoms. ---Caller states she feels jittery, fatigued and has chest tightness. States these sx began 2 days.  Does the patient have any new or worsening symptoms? ---Yes  Will a triage be completed? ---Yes  Related visit to physician within the last 2 weeks? ---No  Does the PT have any chronic conditions? (i.e. diabetes, asthma, etc.) ---Yes  List chronic conditions. ---Asthma, GERD  Is this a behavioral health or substance abuse call? ---No     Guidelines    Guideline Title Affirmed Question Affirmed Notes  Asthma Attack [1] Wheezing or coughing AND [2] hasn't used neb or inhaler twice AND [3] it's available   Asthma Attack MILD asthma attack (e.g., no SOB at rest, mild SOB with walking, speaks normally in sentences, mild wheezing)    Final Disposition User   [place], RN, Almyra Free    Comments  Caller states she is breathing much better and she is less jittery.   Disagree/Comply: Comply    Disagree/Comply: Comply

## 2017-01-04 ENCOUNTER — Ambulatory Visit (INDEPENDENT_AMBULATORY_CARE_PROVIDER_SITE_OTHER): Payer: Medicare Other | Admitting: Internal Medicine

## 2017-01-04 ENCOUNTER — Other Ambulatory Visit (INDEPENDENT_AMBULATORY_CARE_PROVIDER_SITE_OTHER): Payer: Medicare Other

## 2017-01-04 ENCOUNTER — Encounter: Payer: Self-pay | Admitting: Internal Medicine

## 2017-01-04 VITALS — BP 118/82 | HR 83 | Temp 97.7°F | Ht 63.0 in | Wt 112.0 lb

## 2017-01-04 DIAGNOSIS — I2510 Atherosclerotic heart disease of native coronary artery without angina pectoris: Secondary | ICD-10-CM

## 2017-01-04 DIAGNOSIS — I950 Idiopathic hypotension: Secondary | ICD-10-CM | POA: Diagnosis not present

## 2017-01-04 DIAGNOSIS — R42 Dizziness and giddiness: Secondary | ICD-10-CM

## 2017-01-04 DIAGNOSIS — R002 Palpitations: Secondary | ICD-10-CM

## 2017-01-04 DIAGNOSIS — D509 Iron deficiency anemia, unspecified: Secondary | ICD-10-CM | POA: Diagnosis not present

## 2017-01-04 LAB — BASIC METABOLIC PANEL
BUN: 15 mg/dL (ref 6–23)
CALCIUM: 9.9 mg/dL (ref 8.4–10.5)
CO2: 29 meq/L (ref 19–32)
Chloride: 104 mEq/L (ref 96–112)
Creatinine, Ser: 0.84 mg/dL (ref 0.40–1.20)
GFR: 84.12 mL/min (ref >60.00)
GLUCOSE: 96 mg/dL (ref 70–99)
Potassium: 4.1 mEq/L (ref 3.5–5.1)
Sodium: 140 mEq/L (ref 135–145)

## 2017-01-04 LAB — CBC WITH DIFFERENTIAL/PLATELET
BASOS PCT: 0.7 % (ref 0.0–3.0)
Basophils Absolute: 0 10*3/uL (ref 0.0–0.1)
EOS PCT: 6.3 % — AB (ref 0.0–5.0)
Eosinophils Absolute: 0.4 10*3/uL (ref 0.0–0.7)
HEMATOCRIT: 38.1 % (ref 36.0–46.0)
HEMOGLOBIN: 12.9 g/dL (ref 12.0–15.0)
LYMPHS PCT: 35.1 % (ref 12.0–46.0)
Lymphs Abs: 2.3 10*3/uL (ref 0.7–4.0)
MCHC: 33.8 g/dL (ref 30.0–36.0)
MCV: 94.3 fl (ref 78.0–100.0)
MONOS PCT: 7.9 % (ref 3.0–12.0)
Monocytes Absolute: 0.5 10*3/uL (ref 0.1–1.0)
Neutro Abs: 3.3 10*3/uL (ref 1.4–7.7)
Neutrophils Relative %: 50 % (ref 43.0–77.0)
Platelets: 284 10*3/uL (ref 150.0–400.0)
RBC: 4.04 Mil/uL (ref 3.87–5.11)
RDW: 13.1 % (ref 11.5–15.5)
WBC: 6.7 10*3/uL (ref 4.0–10.5)

## 2017-01-04 LAB — URINALYSIS
Bilirubin Urine: NEGATIVE
Hgb urine dipstick: NEGATIVE
KETONES UR: NEGATIVE
LEUKOCYTES UA: NEGATIVE
Nitrite: NEGATIVE
PH: 6 (ref 5.0–8.0)
Total Protein, Urine: NEGATIVE
Urine Glucose: NEGATIVE
Urobilinogen, UA: 0.2 (ref 0.0–1.0)

## 2017-01-04 LAB — TSH: TSH: 1.74 u[IU]/mL (ref 0.35–4.50)

## 2017-01-04 LAB — CORTISOL: Cortisol, Plasma: 8.1 ug/dL

## 2017-01-04 NOTE — Assessment & Plan Note (Signed)
No angina 

## 2017-01-04 NOTE — Assessment & Plan Note (Addendum)
Labs Not on any BP meds If blood pressure is low: coffee, coke, chocolate, pretzels or chips

## 2017-01-04 NOTE — Assessment & Plan Note (Signed)
Labs

## 2017-01-04 NOTE — Assessment & Plan Note (Signed)
CBC

## 2017-01-04 NOTE — Progress Notes (Signed)
Subjective:  Patient ID: Cindy Meza, female    DOB: 05-20-1936  Age: 81 y.o. MRN: [id number]  CC: No chief complaint on file.   HPI Cindy Meza presents for lightheadedness, dizziness, feeling jittery since Monday this week. C/o feeling weak. SBP 88-112 at home. ?virus  Outpatient Medications Prior to Visit  Medication Sig Dispense Refill  . albuterol (PROVENTIL HFA;VENTOLIN HFA) 108 (90 Base) MCG/ACT inhaler Inhale 1-2 puffs into the lungs every 6 (six) hours.    Marland Kitchen aspirin 81 MG tablet Take 81 mg by mouth daily.    . Calcium-Magnesium-Vitamin D (CALCIUM 500 PO) Take 1 each by mouth daily.    . Cholecalciferol 1000 UNITS tablet Take 1,000 Units by mouth daily.      . fluticasone-salmeterol (ADVAIR HFA) 115-21 MCG/ACT inhaler Inhale 2 puffs into the lungs 2 (two) times daily. 1 Inhaler 11  . loratadine (CLARITIN) 10 MG tablet Take 10 mg by mouth daily.     Marland Kitchen MEGARED OMEGA-3 KRILL OIL 500 MG CAPS Take 1 capsule by mouth every morning. 100 capsule 3  . polyethylene glycol (MIRALAX / GLYCOLAX) packet Take 17 g by mouth daily. 30 each 11  . ranitidine (ZANTAC) 150 MG tablet Take 1 tablet (150 mg total) by mouth 2 (two) times daily. 60 tablet 5  . vitamin E (VITAMIN E) 400 UNIT capsule Take 1 capsule (400 Units total) by mouth daily. 100 capsule 1  . acetaminophen (TYLENOL) 650 MG CR tablet Take 650 mg by mouth every 6 (six) hours as needed for pain.    . ferrous sulfate 325 (65 FE) MG tablet Take 1 tablet (325 mg total) by mouth daily. 30 tablet 3  . montelukast (SINGULAIR) 10 MG tablet Take 1 tablet (10 mg total) by mouth daily. 30 tablet 11   No facility-administered medications prior to visit.     ROS Review of Systems  Constitutional: Positive for fatigue. Negative for activity change, appetite change, chills and unexpected weight change.  HENT: Negative for congestion, mouth sores and sinus pressure.   Eyes: Negative for visual disturbance.  Respiratory: Negative for cough  and chest tightness.   Gastrointestinal: Negative for abdominal pain and nausea.  Genitourinary: Negative for difficulty urinating, frequency and vaginal pain.  Musculoskeletal: Negative for back pain and gait problem.  Skin: Negative for pallor and rash.  Neurological: Positive for dizziness. Negative for tremors, numbness and headaches.  Psychiatric/Behavioral: Positive for suicidal ideas. Negative for confusion and sleep disturbance. The patient is nervous/anxious.     Objective:  BP 118/82 (BP Location: Left Arm, Patient Position: Sitting, Cuff Size: Normal)   Pulse 83   Temp 97.7 F (36.5 C) (Oral)   Ht 5\' 3"  (1.6 m)   Wt 112 lb (50.8 kg)   SpO2 98%   BMI 19.84 kg/m   BP Readings from Last 3 Encounters:  01/04/17 118/82  10/24/16 118/72  07/27/16 112/70    Wt Readings from Last 3 Encounters:  01/04/17 112 lb (50.8 kg)  10/24/16 114 lb 1.3 oz (51.7 kg)  07/27/16 117 lb (53.1 kg)    Physical Exam  Constitutional: She appears well-developed. No distress.  HENT:  Head: Normocephalic.  Right Ear: External ear normal.  Left Ear: External ear normal.  Nose: Nose normal.  Mouth/Throat: Oropharynx is clear and moist.  Eyes: Conjunctivae are normal. Pupils are equal, round, and reactive to light. Right eye exhibits no discharge. Left eye exhibits no discharge.  Neck: Normal range of motion. Neck supple. No  JVD present. No tracheal deviation present. No thyromegaly present.  Cardiovascular: Normal rate, regular rhythm and normal heart sounds.   Pulmonary/Chest: No stridor. No respiratory distress. She has no wheezes.  Abdominal: Soft. Bowel sounds are normal. She exhibits no distension and no mass. There is no tenderness. There is no rebound and no guarding.  Musculoskeletal: She exhibits no edema or tenderness.  Lymphadenopathy:    She has no cervical adenopathy.  Neurological: She displays normal reflexes. No cranial nerve deficit. She exhibits normal muscle tone.  Coordination abnormal.  Skin: No rash noted. No erythema.  Psychiatric: She has a normal mood and affect. Her behavior is normal. Judgment and thought content normal.   Cane  Lab Results  Component Value Date   WBC 7.3 10/24/2016   HGB 11.4 (L) 10/24/2016   HCT 34.1 (L) 10/24/2016   PLT 282.0 10/24/2016   GLUCOSE 122 (H) 10/24/2016   CHOL 256 (H) 03/23/2014   TRIG 54.0 03/23/2014   HDL 129.60 03/23/2014   LDLDIRECT 104.0 03/17/2013   LDLCALC 116 (H) 03/23/2014   ALT 10 03/30/2015   AST 17 03/30/2015   NA 138 10/24/2016   K 4.1 10/24/2016   CL 105 10/24/2016   CREATININE 0.87 10/24/2016   BUN 15 10/24/2016   CO2 29 10/24/2016   TSH 2.37 02/15/2016   INR 1.04 06/12/2016   HGBA1C 5.2 11/05/2014    Dg Chest 1 View  Result Date: 06/12/2016 CLINICAL DATA:  81 y/o  F; status post fall. EXAM: CHEST 1 VIEW COMPARISON:  01/16/2006 chest radiograph FINDINGS: Stable cardiac silhouette within normal limits given projection and technique. Aortic atherosclerosis with arch calcification. Clear lungs. No acute osseous abnormality is evident IMPRESSION: No active disease. Electronically Signed   By: Kristine Garbe M.D.   On: 06/12/2016 22:41   Dg C-arm 1-60 Min  Result Date: 06/13/2016 CLINICAL DATA:  81 year old female undergoing right femur ORIF. Initial encounter. EXAM: DG C-ARM 61-120 MIN; RIGHT FEMUR 2 VIEWS COMPARISON:  Right femur series 12517. FINDINGS: 4 intraoperative fluoroscopic spot views of the right femur done demonstrating intra medullary rod placement with proximal interlocking dynamic hip screw and distal interlocking cortical screw. Improved alignment about the comminuted intertrochanteric fracture. Hardware appears intact. FLUOROSCOPY TIME:  2 minutes 22 seconds IMPRESSION: Right femur ORIF with improved intertrochanteric fracture alignment. Electronically Signed   By: Genevie Ann M.D.   On: 06/13/2016 14:06   Dg Hip Unilat  With Pelvis 2-3 Views Right  Result Date:  06/12/2016 CLINICAL DATA:  Initial evaluation for acute trauma. EXAM: DG HIP (WITH OR WITHOUT PELVIS) 2-3V RIGHT COMPARISON:  None. FINDINGS: There is an acute mildly displaced intertrochanteric fracture of the right hip. Right femoral head remains normally position within the acetabulum. Femoral head height preserved. Limited views of the left hip demonstrate no acute osseous abnormality. 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 Bony pelvis intact. SI joints approximated. IMPRESSION: Acute mildly displaced intertrochanteric fracture of the right hip. Electronically Signed   By: Jeannine Boga M.D.   On: 06/12/2016 21:11   Dg Femur 1v Right  Result Date: 06/12/2016 CLINICAL DATA:  81 y/o  F; hip fracture EXAM: RIGHT FEMUR 1 VIEW COMPARISON:  None. FINDINGS: Partially visualized displaced and angulated intertrochanteric fracture of the right hip. The femoral shaft is otherwise intact. The visualized knee joint is grossly maintained. IMPRESSION: Partially visualized displaced and angulated intertrochanteric fracture of the right hip. The femoral shaft is otherwise intact. Electronically Signed   By: Kristine Garbe  M.D.   On: 06/12/2016 22:43   Dg Femur, Min 2 Views Right  Result Date: 06/13/2016 CLINICAL DATA:  81 year old female undergoing right femur ORIF. Initial encounter. EXAM: DG C-ARM 61-120 MIN; RIGHT FEMUR 2 VIEWS COMPARISON:  Right femur series 12517. FINDINGS: 4 intraoperative fluoroscopic spot views of the right femur done demonstrating intra medullary rod placement with proximal interlocking dynamic hip screw and distal interlocking cortical screw. Improved alignment about the comminuted intertrochanteric fracture. Hardware appears intact. FLUOROSCOPY TIME:  2 minutes 22 seconds IMPRESSION: Right femur ORIF with improved intertrochanteric fracture alignment. Electronically Signed   By: Genevie Ann M.D.   On: 06/13/2016 14:06   Dg Femur Port, Min 2 Views  Right  Result Date: 06/13/2016 CLINICAL DATA:  IM nail. EXAM: RIGHT FEMUR PORTABLE 2 VIEW COMPARISON:  06/12/2016 FINDINGS: Internal fixation across the comminuted right femoral intertrochanteric fracture. Fracture fragments remain mildly displaced. Decreasing varus angulation. No hardware complicating feature. IMPRESSION: Internal fixation across the comminuted right femoral intertrochanteric fracture with continued mild displacement of fracture fragments. Electronically Signed   By: Rolm Baptise M.D.   On: 06/13/2016 15:08    Assessment & Plan:   There are no diagnoses linked to this encounter. I have discontinued Ms. Beharry's montelukast, acetaminophen, and ferrous sulfate. I am also having her maintain her loratadine, Cholecalciferol, Calcium-Magnesium-Vitamin D (CALCIUM 500 PO), vitamin E, MEGARED OMEGA-3 KRILL OIL, fluticasone-salmeterol, albuterol, aspirin, ranitidine, and polyethylene glycol.  No orders of the defined types were placed in this encounter.    Follow-up: No Follow-up on file.  [name], MD

## 2017-01-04 NOTE — Patient Instructions (Addendum)
If blood pressure is low: coffee, coke, chocolate, pretzels or chips Go to ER if worse

## 2017-01-18 LAB — HM MAMMOGRAPHY

## 2017-01-22 ENCOUNTER — Ambulatory Visit: Payer: Medicare Other | Admitting: Internal Medicine

## 2017-01-22 ENCOUNTER — Encounter: Payer: Self-pay | Admitting: Internal Medicine

## 2017-01-22 NOTE — Progress Notes (Signed)
Abstracted and sent to scan  

## 2017-01-23 ENCOUNTER — Ambulatory Visit (INDEPENDENT_AMBULATORY_CARE_PROVIDER_SITE_OTHER): Payer: Medicare Other | Admitting: Internal Medicine

## 2017-01-23 ENCOUNTER — Encounter: Payer: Self-pay | Admitting: Internal Medicine

## 2017-01-23 VITALS — BP 122/80 | HR 82 | Temp 97.6°F | Ht 63.0 in | Wt 112.0 lb

## 2017-01-23 DIAGNOSIS — I950 Idiopathic hypotension: Secondary | ICD-10-CM | POA: Diagnosis not present

## 2017-01-23 DIAGNOSIS — J4520 Mild intermittent asthma, uncomplicated: Secondary | ICD-10-CM

## 2017-01-23 DIAGNOSIS — M810 Age-related osteoporosis without current pathological fracture: Secondary | ICD-10-CM

## 2017-01-23 DIAGNOSIS — Z86010 Personal history of colonic polyps: Secondary | ICD-10-CM

## 2017-01-23 DIAGNOSIS — M8000XS Age-related osteoporosis with current pathological fracture, unspecified site, sequela: Secondary | ICD-10-CM

## 2017-01-23 NOTE — Assessment & Plan Note (Signed)
-   better w/salty foods, coke... SBP>113 at home Call if SBP<90

## 2017-01-23 NOTE — Assessment & Plan Note (Signed)
DEXA 

## 2017-01-23 NOTE — Assessment & Plan Note (Signed)
Doing well Advair Singulair

## 2017-01-23 NOTE — Assessment & Plan Note (Signed)
She will meet w/Dr [name] to discuss colonoscopy

## 2017-01-23 NOTE — Progress Notes (Signed)
Subjective:  Patient ID: Cindy Meza, female    DOB: 06-15-1936  Age: 81 y.o. MRN: [id number]  CC: No chief complaint on file.   HPI Cindy Meza presents for a low BP and dizziness - better w/salty foods, coke... SBP>113 at home F/u polyps, asthma, osteoporosis   Outpatient Medications Prior to Visit  Medication Sig Dispense Refill  . albuterol (PROVENTIL HFA;VENTOLIN HFA) 108 (90 Base) MCG/ACT inhaler Inhale 1-2 puffs into the lungs every 6 (six) hours.    Marland Kitchen aspirin 81 MG tablet Take 81 mg by mouth daily.    . Calcium-Magnesium-Vitamin D (CALCIUM 500 PO) Take 1 each by mouth daily.    . Cholecalciferol 1000 UNITS tablet Take 1,000 Units by mouth daily.      . fluticasone-salmeterol (ADVAIR HFA) 115-21 MCG/ACT inhaler Inhale 2 puffs into the lungs 2 (two) times daily. 1 Inhaler 11  . loratadine (CLARITIN) 10 MG tablet Take 10 mg by mouth daily.     Marland Kitchen MEGARED OMEGA-3 KRILL OIL 500 MG CAPS Take 1 capsule by mouth every morning. 100 capsule 3  . polyethylene glycol (MIRALAX / GLYCOLAX) packet Take 17 g by mouth daily. 30 each 11  . ranitidine (ZANTAC) 150 MG tablet Take 1 tablet (150 mg total) by mouth 2 (two) times daily. 60 tablet 5  . vitamin E (VITAMIN E) 400 UNIT capsule Take 1 capsule (400 Units total) by mouth daily. 100 capsule 1   No facility-administered medications prior to visit.     ROS Review of Systems  Constitutional: Negative for activity change, appetite change, chills, fatigue and unexpected weight change.  HENT: Negative for congestion, mouth sores and sinus pressure.   Eyes: Negative for visual disturbance.  Respiratory: Negative for cough and chest tightness.   Gastrointestinal: Negative for abdominal pain and nausea.  Genitourinary: Negative for difficulty urinating, frequency and vaginal pain.  Musculoskeletal: Negative for back pain and gait problem.  Skin: Negative for pallor and rash.  Neurological: Negative for dizziness, tremors, weakness,  numbness and headaches.  Psychiatric/Behavioral: Negative for confusion and sleep disturbance.    Objective:  BP 122/80 (BP Location: Left Arm, Patient Position: Sitting, Cuff Size: Normal)   Pulse 82   Temp 97.6 F (36.4 C) (Oral)   Ht 5\' 3"  (1.6 m)   Wt 112 lb (50.8 kg)   SpO2 96%   BMI 19.84 kg/m   BP Readings from Last 3 Encounters:  01/23/17 122/80  01/04/17 118/82  10/24/16 118/72    Wt Readings from Last 3 Encounters:  01/23/17 112 lb (50.8 kg)  01/04/17 112 lb (50.8 kg)  10/24/16 114 lb 1.3 oz (51.7 kg)    Physical Exam  Constitutional: She appears well-developed. No distress.  HENT:  Head: Normocephalic.  Right Ear: External ear normal.  Left Ear: External ear normal.  Nose: Nose normal.  Mouth/Throat: Oropharynx is clear and moist.  Eyes: Pupils are equal, round, and reactive to light. Conjunctivae are normal. Right eye exhibits no discharge. Left eye exhibits no discharge.  Neck: Normal range of motion. Neck supple. No JVD present. No tracheal deviation present. No thyromegaly present.  Cardiovascular: Normal rate, regular rhythm and normal heart sounds.   Pulmonary/Chest: No stridor. No respiratory distress. She has no wheezes.  Abdominal: Soft. Bowel sounds are normal. She exhibits no distension and no mass. There is no tenderness. There is no rebound and no guarding.  Musculoskeletal: She exhibits no edema or tenderness.  Lymphadenopathy:    She has no cervical  adenopathy.  Neurological: She displays normal reflexes. No cranial nerve deficit. She exhibits normal muscle tone. Coordination abnormal.  Skin: No rash noted. No erythema.  Psychiatric: She has a normal mood and affect. Her behavior is normal. Judgment and thought content normal.  cane  Lab Results  Component Value Date   WBC 6.7 01/04/2017   HGB 12.9 01/04/2017   HCT 38.1 01/04/2017   PLT 284.0 01/04/2017   GLUCOSE 96 01/04/2017   CHOL 256 (H) 03/23/2014   TRIG 54.0 03/23/2014   HDL  129.60 03/23/2014   LDLDIRECT 104.0 03/17/2013   LDLCALC 116 (H) 03/23/2014   ALT 10 03/30/2015   AST 17 03/30/2015   NA 140 01/04/2017   K 4.1 01/04/2017   CL 104 01/04/2017   CREATININE 0.84 01/04/2017   BUN 15 01/04/2017   CO2 29 01/04/2017   TSH 1.74 01/04/2017   INR 1.04 06/12/2016   HGBA1C 5.2 11/05/2014    Dg Chest 1 View  Result Date: 06/12/2016 CLINICAL DATA:  81 y/o  F; status post fall. EXAM: CHEST 1 VIEW COMPARISON:  01/16/2006 chest radiograph FINDINGS: Stable cardiac silhouette within normal limits given projection and technique. Aortic atherosclerosis with arch calcification. Clear lungs. No acute osseous abnormality is evident IMPRESSION: No active disease. Electronically Signed   By: Kristine Garbe M.D.   On: 06/12/2016 22:41   Dg C-arm 1-60 Min  Result Date: 06/13/2016 CLINICAL DATA:  81 year old female undergoing right femur ORIF. Initial encounter. EXAM: DG C-ARM 61-120 MIN; RIGHT FEMUR 2 VIEWS COMPARISON:  Right femur series 12517. FINDINGS: 4 intraoperative fluoroscopic spot views of the right femur done demonstrating intra medullary rod placement with proximal interlocking dynamic hip screw and distal interlocking cortical screw. Improved alignment about the comminuted intertrochanteric fracture. Hardware appears intact. FLUOROSCOPY TIME:  2 minutes 22 seconds IMPRESSION: Right femur ORIF with improved intertrochanteric fracture alignment. Electronically Signed   By: Genevie Ann M.D.   On: 06/13/2016 14:06   Dg Hip Unilat  With Pelvis 2-3 Views Right  Result Date: 06/12/2016 CLINICAL DATA:  Initial evaluation for acute trauma. EXAM: DG HIP (WITH OR WITHOUT PELVIS) 2-3V RIGHT COMPARISON:  None. FINDINGS: There is an acute mildly displaced intertrochanteric fracture of the right hip. Right femoral head remains normally position within the acetabulum. Femoral head height preserved. Limited views of the left hip demonstrate no acute osseous abnormality. Degenerative  osteoarthritic changes about the hips bilaterally. Bony pelvis intact. SI joints approximated. IMPRESSION: Acute mildly displaced intertrochanteric fracture of the right hip. Electronically Signed   By: Jeannine Boga M.D.   On: 06/12/2016 21:11   Dg Femur 1v Right  Result Date: 06/12/2016 CLINICAL DATA:  81 y/o  F; hip fracture EXAM: RIGHT FEMUR 1 VIEW COMPARISON:  None. FINDINGS: Partially visualized displaced and angulated intertrochanteric fracture of the right hip. The femoral shaft is otherwise intact. The visualized knee joint is grossly maintained. IMPRESSION: Partially visualized displaced and angulated intertrochanteric fracture of the right hip. The femoral shaft is otherwise intact. Electronically Signed   By: Kristine Garbe M.D.   On: 06/12/2016 22:43   Dg Femur, Min 2 Views Right  Result Date: 06/13/2016 CLINICAL DATA:  81 year old female undergoing right femur ORIF. Initial encounter. EXAM: DG C-ARM 61-120 MIN; RIGHT FEMUR 2 VIEWS COMPARISON:  Right femur series 12517. FINDINGS: 4 intraoperative fluoroscopic spot views of the right femur done demonstrating intra medullary rod placement with proximal interlocking dynamic hip screw and distal interlocking cortical screw. Improved alignment about the comminuted intertrochanteric  fracture. Hardware appears intact. FLUOROSCOPY TIME:  2 minutes 22 seconds IMPRESSION: Right femur ORIF with improved intertrochanteric fracture alignment. Electronically Signed   By: Genevie Ann M.D.   On: 06/13/2016 14:06   Dg Femur Port, Min 2 Views Right  Result Date: 06/13/2016 CLINICAL DATA:  IM nail. EXAM: RIGHT FEMUR PORTABLE 2 VIEW COMPARISON:  06/12/2016 FINDINGS: Internal fixation across the comminuted right femoral intertrochanteric fracture. Fracture fragments remain mildly displaced. Decreasing varus angulation. No hardware complicating feature. IMPRESSION: Internal fixation across the comminuted right femoral intertrochanteric fracture with  continued mild displacement of fracture fragments. Electronically Signed   By: Rolm Baptise M.D.   On: 06/13/2016 15:08    Assessment & Plan:   There are no diagnoses linked to this encounter. I am having Ms. Nicole Kindred maintain her loratadine, Cholecalciferol, Calcium-Magnesium-Vitamin D (CALCIUM 500 PO), vitamin E, MEGARED OMEGA-3 KRILL OIL, fluticasone-salmeterol, albuterol, aspirin, ranitidine, and polyethylene glycol.  No orders of the defined types were placed in this encounter.    Follow-up: No Follow-up on file.  [name], MD

## 2017-02-04 ENCOUNTER — Encounter: Payer: Self-pay | Admitting: Internal Medicine

## 2017-02-04 ENCOUNTER — Encounter (INDEPENDENT_AMBULATORY_CARE_PROVIDER_SITE_OTHER): Payer: Self-pay

## 2017-02-04 ENCOUNTER — Ambulatory Visit (INDEPENDENT_AMBULATORY_CARE_PROVIDER_SITE_OTHER): Payer: Medicare Other | Admitting: Internal Medicine

## 2017-02-04 VITALS — BP 122/70 | HR 72 | Ht 63.0 in | Wt 114.0 lb

## 2017-02-04 DIAGNOSIS — K219 Gastro-esophageal reflux disease without esophagitis: Secondary | ICD-10-CM

## 2017-02-04 DIAGNOSIS — Z86010 Personal history of colonic polyps: Secondary | ICD-10-CM

## 2017-02-04 DIAGNOSIS — K649 Unspecified hemorrhoids: Secondary | ICD-10-CM

## 2017-02-04 NOTE — Patient Instructions (Signed)
  We have put you in the system for a colonoscopy recall for October 2018.    Try to keep a journal of your triggers for heartburn.   Today we are giving you a handout to read on GERD.    I appreciate the opportunity to care for you. Silvano Rusk, MD, [facility]

## 2017-02-04 NOTE — Progress Notes (Signed)
Cindy Meza 81 y.o. [birth year] [id number]  Assessment & Plan:   Encounter Diagnoses  Name Primary?  . Gastroesophageal reflux disease, esophagitis presence not specified Yes  . Bleeding hemorrhoids   . Hx of adenomatous colonic polyps     We have decided that is reasonable to repeat a colonoscopy for surveillance. She did have 3 adenomas in the past and has a history of adenomas previously. She wants to defer this until later in the year because she still recovering from her femur fracture. We will plan to do it before the end of the year and we will trigger a recall in October. She will continue on her ranitidine twice a day for her GERD. 3 or 4 days out of the month where she has some symptoms she'll try to see if there are specific trigger she can avoid. When necessary antacids can be used. We can consider a PPI again but I think she's doing pretty well with this treatment and we may not need to go there. She will consider hemorrhoid banding at some point if she wants.  I appreciate the opportunity to care for this patient. CC: [name], Evie Lacks, MD  Subjective:   Chief Complaint:Heartburn, colon polyp history bleeding hemorrhoids  HPI The patient is here for follow-up. She had a colonoscopy in 2015 with 3 small adenomas. She also had hemorrhoids identified. She has some occasional intermittent bleeding from the hemorrhoids but has decided not to pursue any banding procedure. She has GERD was on PPI in the past I think it was Prilosec but came off of that and is on ranitidine 150 mg twice a day. Overall except for about 3 or 4 times a month she has brief heartburn, she is asymptomatic. No dysphagia. She fell and broke her femur injury December of last year and is still recovering from that and is not interested in pursuing a colonoscopy right now but does think it's reasonable to pursue 1 more. She has a history of polyps going back a number of years last was in 2015 as  mentioned. Allergies  Allergen Reactions  . Fosamax [Alendronate Sodium]     paresthesias  . Gabapentin     dizziness  . Lidocaine Swelling    Of face and lips.  Novocaine does same thing.  . Risedronate Sodium     Knee pain  . Shellfish Allergy Hives  . Statins     REACTION: pains   Current Meds  Medication Sig  . albuterol (PROVENTIL HFA;VENTOLIN HFA) 108 (90 Base) MCG/ACT inhaler Inhale 1-2 puffs into the lungs every 6 (six) hours.  Marland Kitchen aspirin 81 MG tablet Take 81 mg by mouth daily.  . Calcium-Magnesium-Vitamin D (CALCIUM 500 PO) Take 1 each by mouth daily.  . Cholecalciferol 1000 UNITS tablet Take 1,000 Units by mouth daily.    . fluticasone-salmeterol (ADVAIR HFA) 115-21 MCG/ACT inhaler Inhale 2 puffs into the lungs 2 (two) times daily.  Marland Kitchen loratadine (CLARITIN) 10 MG tablet Take 10 mg by mouth daily.   Marland Kitchen MEGARED OMEGA-3 KRILL OIL 500 MG CAPS Take 1 capsule by mouth every morning.  . polyethylene glycol (MIRALAX / GLYCOLAX) packet Take 17 g by mouth daily.  . ranitidine (ZANTAC) 150 MG tablet Take 1 tablet (150 mg total) by mouth 2 (two) times daily.  . vitamin E (VITAMIN E) 400 UNIT capsule Take 1 capsule (400 Units total) by mouth daily.   Past Medical History:  Diagnosis Date  . Asthma    Dr.  Wert  . Breast cyst    Left  . Cataract    Dr. Gershon Crane  . Complication of anesthesia    ' I HAD HALLUCINATIONS WHEN THEY DID MY HIP "  . GERD (gastroesophageal reflux disease)   . Hemorrhoids   . History of colonic polyps [name]   hx of adenomas (small) 1998  . OA (osteoarthritis)   . Osteopenia   . PAC (premature atrial contraction)   . Vitamin D deficiency    Past Surgical History:  Procedure Laterality Date  . ABDOMINAL HYSTERECTOMY     complete  . APPENDECTOMY    . BREAST CYST EXCISION    . COLONOSCOPY    . INTRAMEDULLARY (IM) NAIL INTERTROCHANTERIC Right 06/13/2016   Procedure: INTRAMEDULLARY (IM) NAIL INTERTROCHANTRIC;  Surgeon: Tania Ade, MD;  Location:  [place];  Service: Orthopedics;  Laterality: Right;  . TONSILLECTOMY     Social History   Social History  . Marital status: Widowed    Spouse name: N/A  . Number of children: N/A  . Years of education: N/A   Occupational History  . retired    Social History Main Topics  . Smoking status: Never Smoker  . Smokeless tobacco: Never Used  . Alcohol use 0.0 oz/week     Comment: 3-4 times per week - vodka 1 drink  . Drug use: No  . Sexual activity: No   Other Topics Concern  . Not on file   Social History Narrative   She lives alone in a [place] home.  No children.   Retired Education officer, museum.   Highest level of education:  Masters degree   family history includes Diabetes (age of onset: 66) in her mother; Hypertension in her other.   Review of Systems She is getting by using a cane and still recovering from her surgery but is doing most of her ADLs at this point.  Objective:   Physical Exam BP 122/70   Pulse 72   Ht 5\' 3"  (1.6 m)   Wt 114 lb (51.7 kg)   BMI 20.19 kg/m  No acute distress Eyes are anicteric Appropriate mood and affect  20 minutes of time spent with the patient over half of which was in counseling and coordination of care

## 2017-02-13 LAB — HM DEXA SCAN

## 2017-02-21 ENCOUNTER — Encounter: Payer: Self-pay | Admitting: Internal Medicine

## 2017-04-05 ENCOUNTER — Encounter: Payer: Medicare Other | Admitting: Internal Medicine

## 2017-04-09 ENCOUNTER — Ambulatory Visit (INDEPENDENT_AMBULATORY_CARE_PROVIDER_SITE_OTHER): Payer: Medicare Other | Admitting: Internal Medicine

## 2017-04-09 ENCOUNTER — Encounter: Payer: Self-pay | Admitting: Internal Medicine

## 2017-04-09 ENCOUNTER — Other Ambulatory Visit (INDEPENDENT_AMBULATORY_CARE_PROVIDER_SITE_OTHER): Payer: Medicare Other

## 2017-04-09 VITALS — BP 130/74 | HR 81 | Temp 97.5°F | Ht 63.0 in | Wt 115.0 lb

## 2017-04-09 DIAGNOSIS — W19XXXS Unspecified fall, sequela: Secondary | ICD-10-CM | POA: Diagnosis not present

## 2017-04-09 DIAGNOSIS — I950 Idiopathic hypotension: Secondary | ICD-10-CM | POA: Diagnosis not present

## 2017-04-09 DIAGNOSIS — D649 Anemia, unspecified: Secondary | ICD-10-CM

## 2017-04-09 DIAGNOSIS — R42 Dizziness and giddiness: Secondary | ICD-10-CM

## 2017-04-09 DIAGNOSIS — J4520 Mild intermittent asthma, uncomplicated: Secondary | ICD-10-CM

## 2017-04-09 DIAGNOSIS — Z0000 Encounter for general adult medical examination without abnormal findings: Secondary | ICD-10-CM

## 2017-04-09 DIAGNOSIS — I2510 Atherosclerotic heart disease of native coronary artery without angina pectoris: Secondary | ICD-10-CM | POA: Diagnosis not present

## 2017-04-09 DIAGNOSIS — E559 Vitamin D deficiency, unspecified: Secondary | ICD-10-CM

## 2017-04-09 DIAGNOSIS — R7309 Other abnormal glucose: Secondary | ICD-10-CM | POA: Diagnosis not present

## 2017-04-09 DIAGNOSIS — N951 Menopausal and female climacteric states: Secondary | ICD-10-CM

## 2017-04-09 DIAGNOSIS — R5383 Other fatigue: Secondary | ICD-10-CM

## 2017-04-09 DIAGNOSIS — Z23 Encounter for immunization: Secondary | ICD-10-CM

## 2017-04-09 LAB — CBC WITH DIFFERENTIAL/PLATELET
BASOS PCT: 1 % (ref 0.0–3.0)
Basophils Absolute: 0.1 10*3/uL (ref 0.0–0.1)
EOS PCT: 2.7 % (ref 0.0–5.0)
Eosinophils Absolute: 0.2 10*3/uL (ref 0.0–0.7)
HCT: 37 % (ref 36.0–46.0)
HEMOGLOBIN: 12.1 g/dL (ref 12.0–15.0)
LYMPHS ABS: 2.5 10*3/uL (ref 0.7–4.0)
Lymphocytes Relative: 34.6 % (ref 12.0–46.0)
MCHC: 32.9 g/dL (ref 30.0–36.0)
MCV: 96.8 fl (ref 78.0–100.0)
MONO ABS: 0.5 10*3/uL (ref 0.1–1.0)
MONOS PCT: 7.6 % (ref 3.0–12.0)
Neutro Abs: 3.9 10*3/uL (ref 1.4–7.7)
Neutrophils Relative %: 54.1 % (ref 43.0–77.0)
Platelets: 262 10*3/uL (ref 150.0–400.0)
RBC: 3.82 Mil/uL — AB (ref 3.87–5.11)
RDW: 12.8 % (ref 11.5–15.5)
WBC: 7.1 10*3/uL (ref 4.0–10.5)

## 2017-04-09 LAB — BASIC METABOLIC PANEL
BUN: 14 mg/dL (ref 6–23)
CHLORIDE: 101 meq/L (ref 96–112)
CO2: 32 mEq/L (ref 19–32)
Calcium: 9.5 mg/dL (ref 8.4–10.5)
Creatinine, Ser: 0.8 mg/dL (ref 0.40–1.20)
GFR: 88.94 mL/min (ref >60.00)
GLUCOSE: 131 mg/dL — AB (ref 70–99)
POTASSIUM: 3.7 meq/L (ref 3.5–5.1)
SODIUM: 138 meq/L (ref 135–145)

## 2017-04-09 LAB — HEMOGLOBIN A1C: HEMOGLOBIN A1C: 4.9 % (ref 4.6–6.5)

## 2017-04-09 MED ORDER — ZOSTER VAC RECOMB ADJUVANTED 50 MCG/0.5ML IM SUSR: 0.5000 mL | Freq: Once | INTRAMUSCULAR | 1 refills | Status: AC

## 2017-04-09 MED ORDER — POLYETHYLENE GLYCOL 3350 17 G PO PACK: 17.0000 g | PACK | Freq: Every day | ORAL | 11 refills | Status: DC

## 2017-04-09 MED ORDER — RANITIDINE HCL 150 MG PO TABS: 150.0000 mg | ORAL_TABLET | Freq: Two times a day (BID) | ORAL | 11 refills | Status: DC

## 2017-04-09 MED ORDER — FLUTICASONE-SALMETEROL 115-21 MCG/ACT IN AERO: 2.0000 | INHALATION_SPRAY | Freq: Two times a day (BID) | RESPIRATORY_TRACT | 11 refills | Status: DC

## 2017-04-09 NOTE — Assessment & Plan Note (Signed)
Soy milk

## 2017-04-09 NOTE — Assessment & Plan Note (Signed)
On Vit D 

## 2017-04-09 NOTE — Assessment & Plan Note (Signed)
Off Singulair 

## 2017-04-09 NOTE — Addendum Note (Signed)
Addended by: Karren Cobble on: 04/09/2017 04:42 PM   Modules accepted: Orders

## 2017-04-09 NOTE — Progress Notes (Signed)
Subjective:  Patient ID: Cindy Meza, female    DOB: 09-15-1935  Age: 81 y.o. MRN: [id number]  CC: No chief complaint on file.   HPI SOO STEELMAN presents for a well exam  Outpatient Medications Prior to Visit  Medication Sig Dispense Refill  . albuterol (PROVENTIL HFA;VENTOLIN HFA) 108 (90 Base) MCG/ACT inhaler Inhale 1-2 puffs into the lungs every 6 (six) hours.    Marland Kitchen aspirin 81 MG tablet Take 81 mg by mouth daily.    . Calcium-Magnesium-Vitamin D (CALCIUM 500 PO) Take 1 each by mouth daily.    . Cholecalciferol 1000 UNITS tablet Take 1,000 Units by mouth daily.      . fluticasone-salmeterol (ADVAIR HFA) 115-21 MCG/ACT inhaler Inhale 2 puffs into the lungs 2 (two) times daily. 1 Inhaler 11  . loratadine (CLARITIN) 10 MG tablet Take 10 mg by mouth daily.     Marland Kitchen MEGARED OMEGA-3 KRILL OIL 500 MG CAPS Take 1 capsule by mouth every morning. 100 capsule 3  . polyethylene glycol (MIRALAX / GLYCOLAX) packet Take 17 g by mouth daily. 30 each 11  . ranitidine (ZANTAC) 150 MG tablet Take 1 tablet (150 mg total) by mouth 2 (two) times daily. 60 tablet 5  . vitamin E (VITAMIN E) 400 UNIT capsule Take 1 capsule (400 Units total) by mouth daily. 100 capsule 1   No facility-administered medications prior to visit.     ROS Review of Systems  Constitutional: Negative for activity change, appetite change, chills, fatigue and unexpected weight change.  HENT: Negative for congestion, mouth sores and sinus pressure.   Eyes: Negative for visual disturbance.  Respiratory: Negative for cough and chest tightness.   Gastrointestinal: Negative for abdominal pain and nausea.  Genitourinary: Negative for difficulty urinating, frequency and vaginal pain.  Musculoskeletal: Negative for back pain and gait problem.  Skin: Negative for pallor and rash.  Neurological: Negative for dizziness, tremors, weakness, numbness and headaches.  Psychiatric/Behavioral: Negative for confusion and sleep disturbance.     Objective:  BP 130/74 (BP Location: Left Arm, Patient Position: Sitting, Cuff Size: Normal)   Pulse 81   Temp (!) 97.5 F (36.4 C) (Oral)   Ht 5\' 3"  (1.6 m)   Wt 115 lb (52.2 kg)   SpO2 98%   BMI 20.37 kg/m   BP Readings from Last 3 Encounters:  04/09/17 130/74  02/04/17 122/70  01/23/17 122/80    Wt Readings from Last 3 Encounters:  04/09/17 115 lb (52.2 kg)  02/04/17 114 lb (51.7 kg)  01/23/17 112 lb (50.8 kg)    Physical Exam  Constitutional: She appears well-developed. No distress.  HENT:  Head: Normocephalic.  Right Ear: External ear normal.  Left Ear: External ear normal.  Nose: Nose normal.  Mouth/Throat: Oropharynx is clear and moist.  Eyes: Pupils are equal, round, and reactive to light. Conjunctivae are normal. Right eye exhibits no discharge. Left eye exhibits no discharge.  Neck: Normal range of motion. Neck supple. No JVD present. No tracheal deviation present. No thyromegaly present.  Cardiovascular: Normal rate, regular rhythm and normal heart sounds.   Pulmonary/Chest: No stridor. No respiratory distress. She has no wheezes.  Abdominal: Soft. Bowel sounds are normal. She exhibits no distension and no mass. There is no tenderness. There is no rebound and no guarding.  Musculoskeletal: She exhibits no edema or tenderness.  Lymphadenopathy:    She has no cervical adenopathy.  Neurological: She displays normal reflexes. No cranial nerve deficit. She exhibits normal muscle tone.  Coordination abnormal.  Skin: No rash noted. No erythema.  Psychiatric: She has a normal mood and affect. Her behavior is normal. Judgment and thought content normal.   Cane  Lab Results  Component Value Date   WBC 6.7 01/04/2017   HGB 12.9 01/04/2017   HCT 38.1 01/04/2017   PLT 284.0 01/04/2017   GLUCOSE 96 01/04/2017   CHOL 256 (H) 03/23/2014   TRIG 54.0 03/23/2014   HDL 129.60 03/23/2014   LDLDIRECT 104.0 03/17/2013   LDLCALC 116 (H) 03/23/2014   ALT 10 03/30/2015    AST 17 03/30/2015   NA 140 01/04/2017   K 4.1 01/04/2017   CL 104 01/04/2017   CREATININE 0.84 01/04/2017   BUN 15 01/04/2017   CO2 29 01/04/2017   TSH 1.74 01/04/2017   INR 1.04 06/12/2016   HGBA1C 5.2 11/05/2014    Dg Chest 1 View  Result Date: 06/12/2016 CLINICAL DATA:  81 y/o  F; status post fall. EXAM: CHEST 1 VIEW COMPARISON:  01/16/2006 chest radiograph FINDINGS: Stable cardiac silhouette within normal limits given projection and technique. Aortic atherosclerosis with arch calcification. Clear lungs. No acute osseous abnormality is evident IMPRESSION: No active disease. Electronically Signed   By: Kristine Garbe M.D.   On: 06/12/2016 22:41   Dg C-arm 1-60 Min  Result Date: 06/13/2016 CLINICAL DATA:  81 year old female undergoing right femur ORIF. Initial encounter. EXAM: DG C-ARM 61-120 MIN; RIGHT FEMUR 2 VIEWS COMPARISON:  Right femur series 12517. FINDINGS: 4 intraoperative fluoroscopic spot views of the right femur done demonstrating intra medullary rod placement with proximal interlocking dynamic hip screw and distal interlocking cortical screw. Improved alignment about the comminuted intertrochanteric fracture. Hardware appears intact. FLUOROSCOPY TIME:  2 minutes 22 seconds IMPRESSION: Right femur ORIF with improved intertrochanteric fracture alignment. Electronically Signed   By: Genevie Ann M.D.   On: 06/13/2016 14:06   Dg Hip Unilat  With Pelvis 2-3 Views Right  Result Date: 06/12/2016 CLINICAL DATA:  Initial evaluation for acute trauma. EXAM: DG HIP (WITH OR WITHOUT PELVIS) 2-3V RIGHT COMPARISON:  None. FINDINGS: There is an acute mildly displaced intertrochanteric fracture of the right hip. Right femoral head remains normally position within the acetabulum. Femoral head height preserved. Limited views of the left hip demonstrate no acute osseous abnormality. 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 Bony pelvis intact. SI joints approximated.  IMPRESSION: Acute mildly displaced intertrochanteric fracture of the right hip. Electronically Signed   By: Jeannine Boga M.D.   On: 06/12/2016 21:11   Dg Femur 1v Right  Result Date: 06/12/2016 CLINICAL DATA:  81 y/o  F; hip fracture EXAM: RIGHT FEMUR 1 VIEW COMPARISON:  None. FINDINGS: Partially visualized displaced and angulated intertrochanteric fracture of the right hip. The femoral shaft is otherwise intact. The visualized knee joint is grossly maintained. IMPRESSION: Partially visualized displaced and angulated intertrochanteric fracture of the right hip. The femoral shaft is otherwise intact. Electronically Signed   By: Kristine Garbe M.D.   On: 06/12/2016 22:43   Dg Femur, Min 2 Views Right  Result Date: 06/13/2016 CLINICAL DATA:  81 year old female undergoing right femur ORIF. Initial encounter. EXAM: DG C-ARM 61-120 MIN; RIGHT FEMUR 2 VIEWS COMPARISON:  Right femur series 12517. FINDINGS: 4 intraoperative fluoroscopic spot views of the right femur done demonstrating intra medullary rod placement with proximal interlocking dynamic hip screw and distal interlocking cortical screw. Improved alignment about the comminuted intertrochanteric fracture. Hardware appears intact. FLUOROSCOPY TIME:  2 minutes 22 seconds IMPRESSION: Right femur ORIF  with improved intertrochanteric fracture alignment. Electronically Signed   By: Genevie Ann M.D.   On: 06/13/2016 14:06   Dg Femur Port, Min 2 Views Right  Result Date: 06/13/2016 CLINICAL DATA:  IM nail. EXAM: RIGHT FEMUR PORTABLE 2 VIEW COMPARISON:  06/12/2016 FINDINGS: Internal fixation across the comminuted right femoral intertrochanteric fracture. Fracture fragments remain mildly displaced. Decreasing varus angulation. No hardware complicating feature. IMPRESSION: Internal fixation across the comminuted right femoral intertrochanteric fracture with continued mild displacement of fracture fragments. Electronically Signed   By: Rolm Baptise  M.D.   On: 06/13/2016 15:08    Assessment & Plan:   There are no diagnoses linked to this encounter. I am having Ms. Nicole Kindred maintain her loratadine, Cholecalciferol, Calcium-Magnesium-Vitamin D (CALCIUM 500 PO), vitamin E, MEGARED OMEGA-3 KRILL OIL, fluticasone-salmeterol, albuterol, aspirin, ranitidine, and polyethylene glycol.  No orders of the defined types were placed in this encounter.    Follow-up: No Follow-up on file.  [name], MD

## 2017-04-09 NOTE — Assessment & Plan Note (Signed)
Here for medicare wellness/physical  Diet: heart healthy  Physical activity: not sedentary  Depression/mood screen: negative  Hearing: intact to whispered voice  Visual acuity: grossly normal, performs annual eye exam; readers ADLs: capable  Fall risk: low - using a cane  Home safety: good  Cognitive evaluation: intact to orientation, naming, recall and repetition  EOL planning: adv directives, full code/ I agree  I have personally reviewed and have noted  1. The patient's medical, surgical and social history  2. Their use of alcohol, tobacco or illicit drugs  3. Their current medications and supplements  4. The patient's functional ability including ADL's, fall risks, home safety risks and hearing or visual impairment.  5. Diet and physical activities  6. Evidence for depression or mood disorders 7. The roster of all physicians providing medical care to patient - is listed in the Snapshot section of the chart and reviewed today.    Today patient counseled on age appropriate routine health concerns for screening and prevention, each reviewed and up to date or declined. Immunizations reviewed and up to date or declined. Labs ordered and reviewed. Risk factors for depression reviewed and negative. Hearing function and visual acuity are intact. ADLs screened and addressed as needed. Functional ability and level of safety reviewed and appropriate. Education, counseling and referrals performed based on assessed risks today. Patient provided with a copy of personalized plan for preventive services.

## 2017-04-09 NOTE — Assessment & Plan Note (Signed)
ASA, krill oil 

## 2017-04-09 NOTE — Assessment & Plan Note (Signed)
Resolved with more salty chips

## 2017-04-09 NOTE — Assessment & Plan Note (Signed)
No relapse 

## 2017-04-10 LAB — IRON,TIBC AND FERRITIN PANEL
%SAT: 27 % (calc) (ref 11–50)
FERRITIN: 199 ng/mL (ref 20–288)
IRON: 75 ug/dL (ref 45–160)
TIBC: 275 mcg/dL (calc) (ref 250–450)

## 2017-04-12 ENCOUNTER — Telehealth: Payer: Self-pay | Admitting: Internal Medicine

## 2017-04-12 NOTE — Telephone Encounter (Signed)
Pt returned your call regarding her blood work results. I gave her MD response. She expressed understanding and did not have any questions at this time.  She wanted to make sure that you knew that she called back.

## 2017-05-16 ENCOUNTER — Telehealth: Payer: Self-pay

## 2017-05-16 NOTE — Telephone Encounter (Signed)
Left message on machine to call back  

## 2017-05-17 MED ORDER — OMEPRAZOLE 40 MG PO CPDR: 40.0000 mg | DELAYED_RELEASE_CAPSULE | Freq: Every day | ORAL | 3 refills | Status: DC

## 2017-05-17 NOTE — Telephone Encounter (Signed)
The pt has been taking zantac BID but has recently began having worse GERD.  She has taken Prilosec in the past that helped.  A prescription was sent for her to try once daily and zantac at bedtime.  She also has a colon recall for December.

## 2017-06-12 ENCOUNTER — Encounter: Payer: Self-pay | Admitting: Internal Medicine

## 2017-07-05 ENCOUNTER — Encounter: Payer: Self-pay | Admitting: Internal Medicine

## 2017-08-22 ENCOUNTER — Ambulatory Visit (AMBULATORY_SURGERY_CENTER): Payer: Self-pay | Admitting: *Deleted

## 2017-08-22 ENCOUNTER — Other Ambulatory Visit: Payer: Self-pay

## 2017-08-22 VITALS — Ht 63.0 in | Wt 119.4 lb

## 2017-08-22 DIAGNOSIS — Z86010 Personal history of colonic polyps: Secondary | ICD-10-CM

## 2017-08-22 NOTE — Progress Notes (Signed)
No egg or soy allergy known to patient  No issues with past sedation with any surgeries  or procedures, no intubation problems  No diet pills per patient No home 02 use per patient  No blood thinners per patient  Pt denies issues with constipation  No A fib or A flutter  EMMI video sent to pt's e mail  

## 2017-09-02 ENCOUNTER — Telehealth: Payer: Self-pay | Admitting: Internal Medicine

## 2017-09-02 MED ORDER — HYDROCORTISONE 2.5 % RE CREA: 1.0000 "application " | TOPICAL_CREAM | Freq: Two times a day (BID) | RECTAL | 0 refills | Status: DC

## 2017-09-02 NOTE — Telephone Encounter (Signed)
Do you want to see her or ok to keep recall colon?

## 2017-09-02 NOTE — Telephone Encounter (Signed)
I think ok to have the colonoscopy if she is ok  She should take extra dose of MiraLax starting today  Can Rx 2.5% hydrocortisone cream # 30 g no refill to treat hemorrhoids

## 2017-09-02 NOTE — Telephone Encounter (Signed)
Patient notified of recommendations. 

## 2017-09-05 ENCOUNTER — Other Ambulatory Visit: Payer: Self-pay

## 2017-09-05 ENCOUNTER — Encounter: Payer: Self-pay | Admitting: Internal Medicine

## 2017-09-05 ENCOUNTER — Ambulatory Visit (AMBULATORY_SURGERY_CENTER): Payer: Medicare Other | Admitting: Internal Medicine

## 2017-09-05 VITALS — BP 141/71 | HR 74 | Temp 97.5°F | Resp 14 | Ht 63.0 in | Wt 115.0 lb

## 2017-09-05 DIAGNOSIS — Z86010 Personal history of colonic polyps: Secondary | ICD-10-CM | POA: Diagnosis not present

## 2017-09-05 MED ORDER — SODIUM CHLORIDE 0.9 % IV SOLN
500.0000 mL | Freq: Once | INTRAVENOUS | Status: DC
Start: 2017-09-05 — End: 2017-10-08

## 2017-09-05 NOTE — Progress Notes (Signed)
Pt's states no medical or surgical changes since previsit or office visit. 

## 2017-09-05 NOTE — Op Note (Signed)
[facility] Patient Name: Cindy Meza Procedure Date: 09/05/2017 10:34 AM MRN: [id number] Endoscopist: Gatha Mayer , MD Age: 82 Referring MD:  Date of Birth: 03-02-1936 Gender: Female Account #: [id number] Procedure:                Colonoscopy Indications:              Surveillance: Personal history of adenomatous                            polyps on last colonoscopy > 3 years ago Medicines:                Propofol per Anesthesia, Monitored Anesthesia Care Procedure:                Pre-Anesthesia Assessment:                           - Prior to the procedure, a History and Physical                            was performed, and patient medications and                            allergies were reviewed. The patient's tolerance of                            previous anesthesia was also reviewed. The risks                            and benefits of the procedure and the sedation                            options and risks were discussed with the patient.                            All questions were answered, and informed consent                            was obtained. Prior Anticoagulants: The patient has                            taken no previous anticoagulant or antiplatelet                            agents. ASA Grade Assessment: II - A patient with                            mild systemic disease. After reviewing the risks                            and benefits, the patient was deemed in                            satisfactory condition to undergo the procedure.  After obtaining informed consent, the colonoscope                            was passed under direct vision. Throughout the                            procedure, the patient's blood pressure, pulse, and                            oxygen saturations were monitored continuously. The                            Colonoscope was introduced through the anus and   advanced to the the cecum, identified by                            appendiceal orifice and ileocecal valve. The                            colonoscopy was performed without difficulty. The                            patient tolerated the procedure well. The quality                            of the bowel preparation was good. The bowel                            preparation used was Miralax. The ileocecal valve,                            appendiceal orifice, and rectum were photographed. Scope In: 10:50:26 AM Scope Out: 11:02:32 AM Scope Withdrawal Time: 0 hours 8 minutes 19 seconds  Total Procedure Duration: 0 hours 12 minutes 6 seconds  Findings:                 The perianal and digital rectal examinations were                            normal.                           Internal hemorrhoids were found.                           The exam was otherwise without abnormality on                            direct and retroflexion views. Complications:            No immediate complications. Estimated Blood Loss:     Estimated blood loss: none. Impression:               - Internal hemorrhoids.                           - The examination was  otherwise normal on direct                            and retroflexion views.                           - No specimens collected. Recommendation:           - Patient has a contact number available for                            emergencies. The signs and symptoms of potential                            delayed complications were discussed with the                            patient. Return to normal activities tomorrow.                            Written discharge instructions were provided to the                            patient.                           - Continue present medications.                           - Resume previous diet.                           - No repeat colonoscopy due to age. Gatha Mayer, MD 09/05/2017 11:07:29 AM This report  has been signed electronically.

## 2017-09-05 NOTE — Progress Notes (Signed)
To PACU, VSS. Report to [email or web address] 

## 2017-09-05 NOTE — Patient Instructions (Addendum)
   No polyps or cancer seen today. Hemorrhoids seen again.  I do not recommend any more routine repeat colonoscopy.  I appreciate the opportunity to care for you. Gatha Mayer, MD, [facility]  HANDOUT GIVEN FOR HEMORRHOIDS YOU HAD AN ENDOSCOPIC PROCEDURE TODAY AT [place]:   Refer to the procedure report that was given to you for any specific questions about what was found during the examination.  If the procedure report does not answer your questions, please call your gastroenterologist to clarify.  If you requested that your care partner not be given the details of your procedure findings, then the procedure report has been included in a sealed envelope for you to review at your convenience later.  YOU SHOULD EXPECT: Some feelings of bloating in the abdomen. Passage of more gas than usual.  Walking can help get rid of the air that was put into your GI tract during the procedure and reduce the bloating. If you had a lower endoscopy (such as a colonoscopy or flexible sigmoidoscopy) you may notice spotting of blood in your stool or on the toilet paper. If you underwent a bowel prep for your procedure, you may not have a normal bowel movement for a few days.  Please Note:  You might notice some irritation and congestion in your nose or some drainage.  This is from the oxygen used during your procedure.  There is no need for concern and it should clear up in a day or so.  SYMPTOMS TO REPORT IMMEDIATELY:   Following lower endoscopy (colonoscopy or flexible sigmoidoscopy):  Excessive amounts of blood in the stool  Significant tenderness or worsening of abdominal pains  Swelling of the abdomen that is new, acute  Fever of 100F or higher   For urgent or emergent issues, a gastroenterologist can be reached at any hour by calling [phone].   DIET:  We do recommend a small meal at first, but then you may proceed to your regular diet.  Drink plenty of fluids but you  should avoid alcoholic beverages for 24 hours.  ACTIVITY:  You should plan to take it easy for the rest of today and you should NOT DRIVE or use heavy machinery until tomorrow (because of the sedation medicines used during the test).    FOLLOW UP: Our staff will call the number listed on your records the next business day following your procedure to check on you and address any questions or concerns that you may have regarding the information given to you following your procedure. If we do not reach you, we will leave a message.  However, if you are feeling well and you are not experiencing any problems, there is no need to return our call.  We will assume that you have returned to your regular daily activities without incident.  If any biopsies were taken you will be contacted by phone or by letter within the next 1-3 weeks.  Please call us at [phone] if you have not heard about the biopsies in 3 weeks.    SIGNATURES/CONFIDENTIALITY: You and/or your care partner have signed paperwork which will be entered into your electronic medical record.  These signatures attest to the fact that that the information above on your After Visit Summary has been reviewed and is understood.  Full responsibility of the confidentiality of this discharge information lies with you and/or your care-partner.

## 2017-09-06 ENCOUNTER — Telehealth: Payer: Self-pay | Admitting: *Deleted

## 2017-09-06 NOTE — Telephone Encounter (Signed)
  Follow up Call-  Call back number 09/05/2017  Post procedure Call Back phone  # [phone]  Permission to leave phone message Yes  Some recent data might be hidden     Patient questions:  Do you have a fever, pain , or abdominal swelling? No. Pain Score  0 *  Have you tolerated food without any problems? Yes.    Have you been able to return to your normal activities? Yes.    Do you have any questions about your discharge instructions: Diet   No. Medications  No. Follow up visit  No.  Do you have questions or concerns about your Care? No.  Actions: * If pain score is 4 or above: No action needed, pain <4.

## 2017-10-06 IMAGING — CT CT ABD-PELV W/ CM
2 of 6 series · 17 of 46 positions shown, 19 images · IV contrast (Omnipaque 300)
Comparison: None.

CLINICAL DATA: Low abdominal pain and bloating since [REDACTED].
Improving symptoms after stopping Fosamax therapy. Initial
encounter.

EXAM:
CT ABDOMEN AND PELVIS WITH CONTRAST
TECHNIQUE: Multidetector CT imaging of the abdomen and pelvis was performed
using the standard protocol following bolus administration of
intravenous contrast.
CONTRAST:  100mL OMNIPAQUE IOHEXOL 300 MG/ML  SOLN

[Series 2: abd/ pel 5mm · axial · 0.57mm/px · z∈[-378,-48]mm · 14 of 76 slices shown, 16 images]
[im 5/76  soft-tissue]
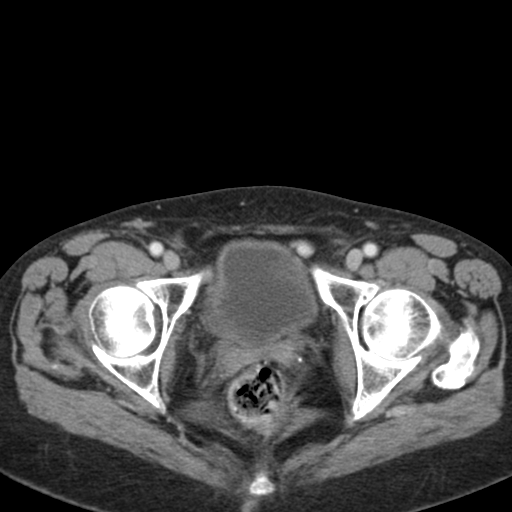
[im 5/76  bone]
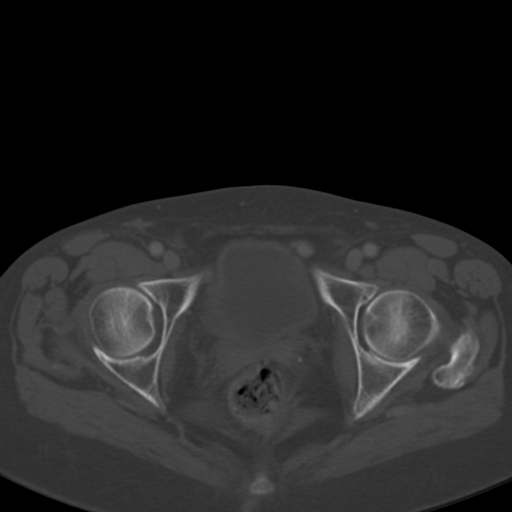
[im 9/76  soft-tissue]
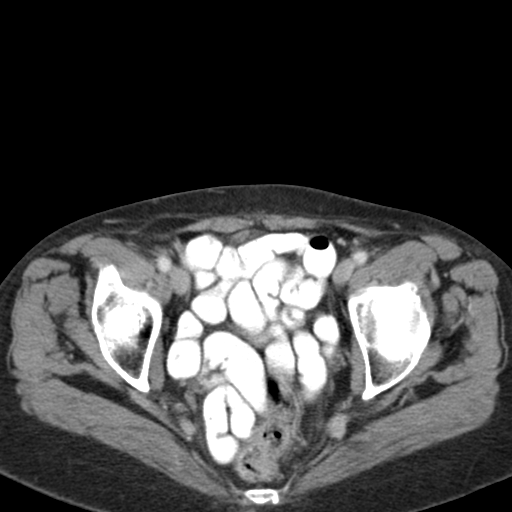
[im 17/76  soft-tissue]
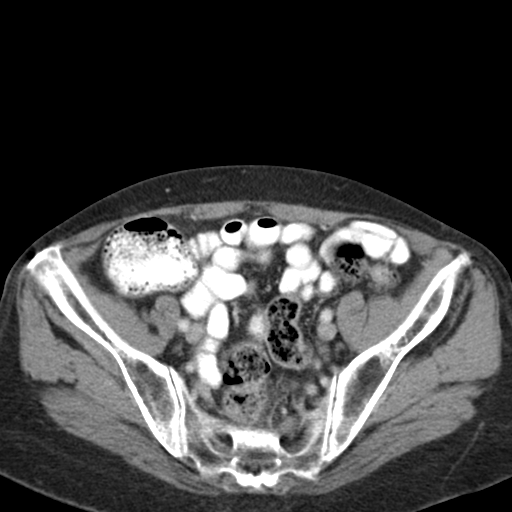
[im 21/76  soft-tissue]
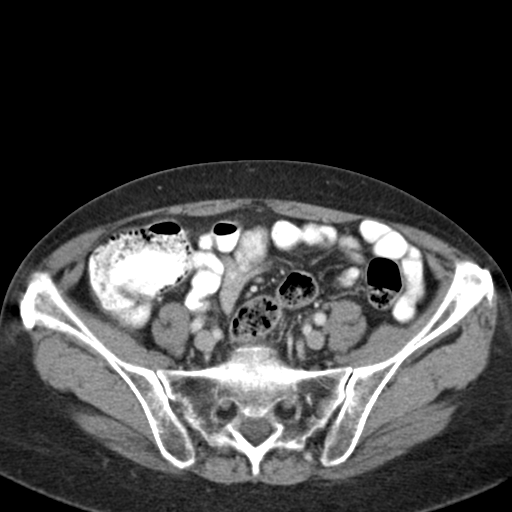
[im 26/76  soft-tissue]
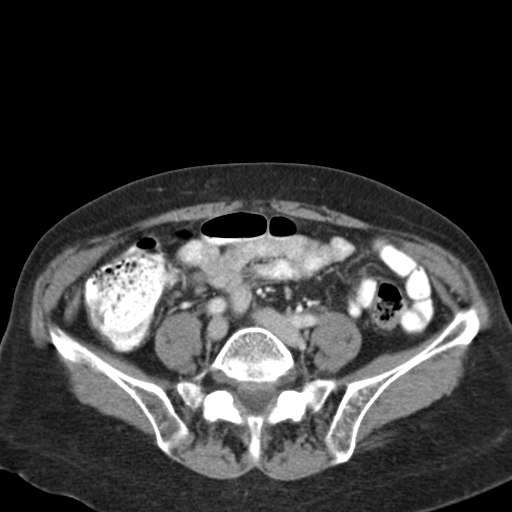
[im 30/76  soft-tissue]
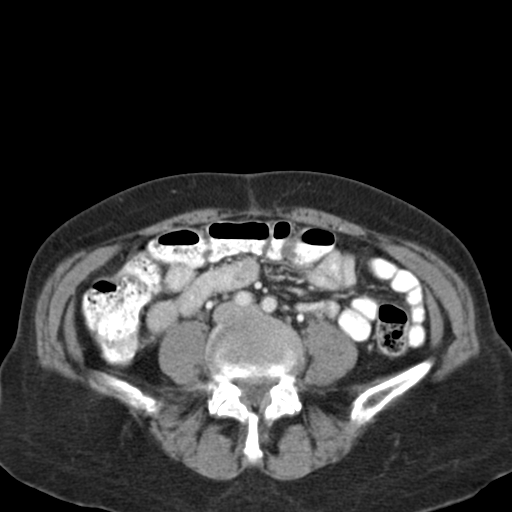
[im 34/76  soft-tissue]
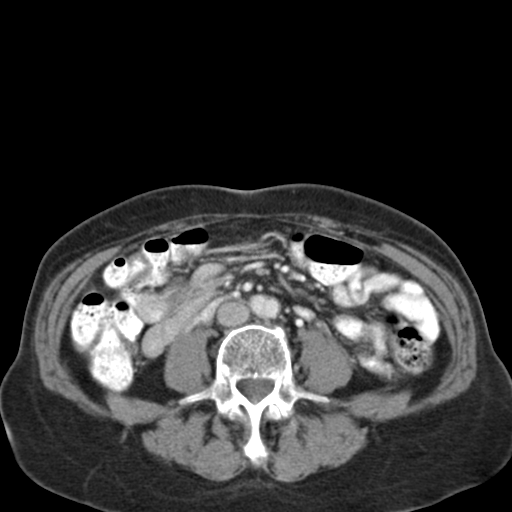
[im 42/76  soft-tissue]
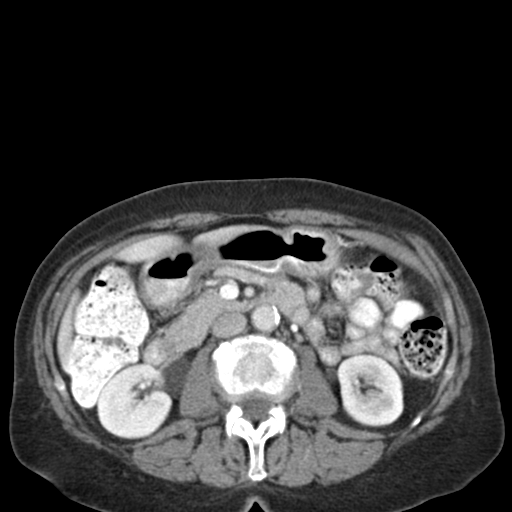
[im 46/76  soft-tissue]
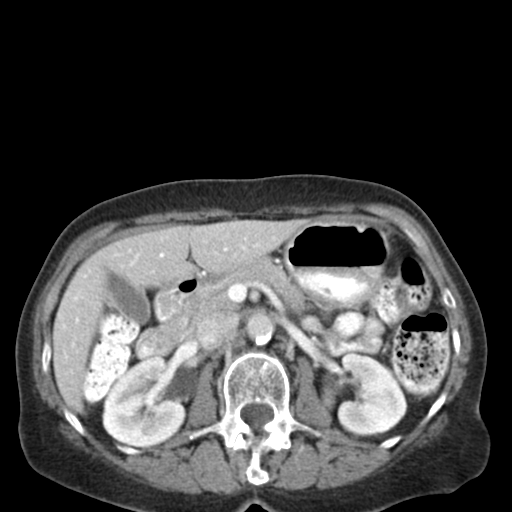
[im 46/76  bone]
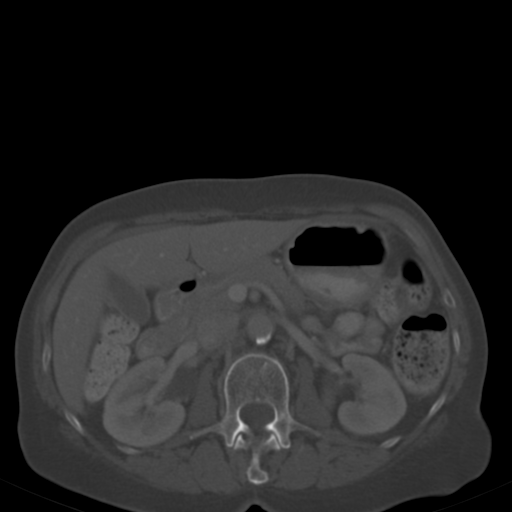
[im 51/76  soft-tissue]
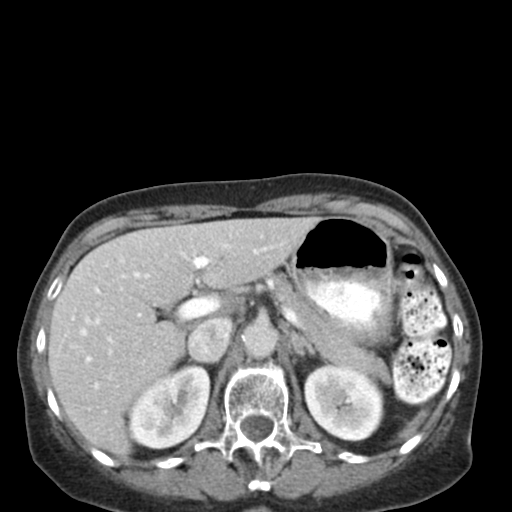
[im 55/76  soft-tissue]
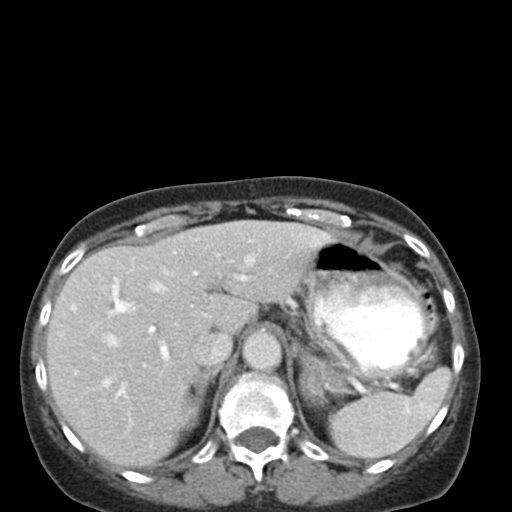
[im 59/76  soft-tissue]
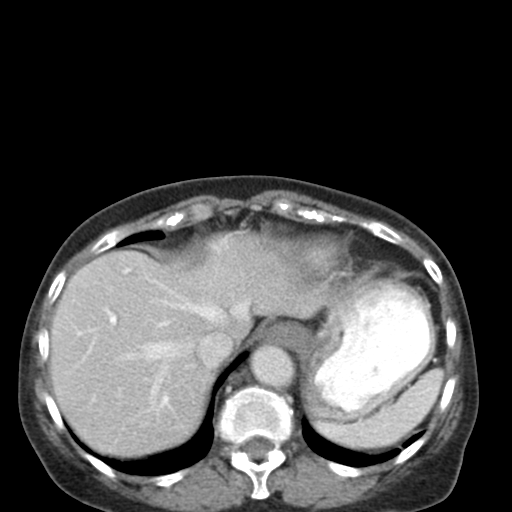
[im 67/76  soft-tissue]
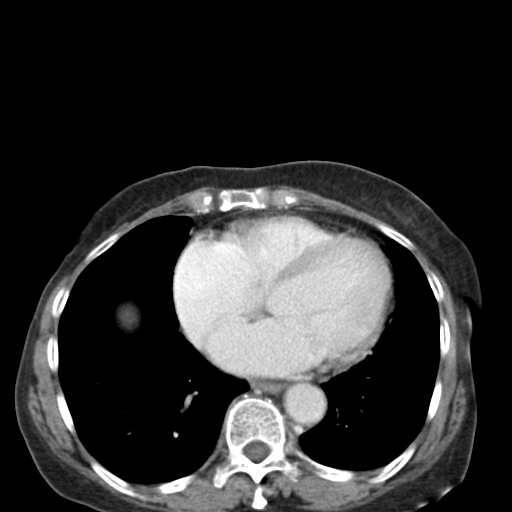
[im 71/76  soft-tissue]
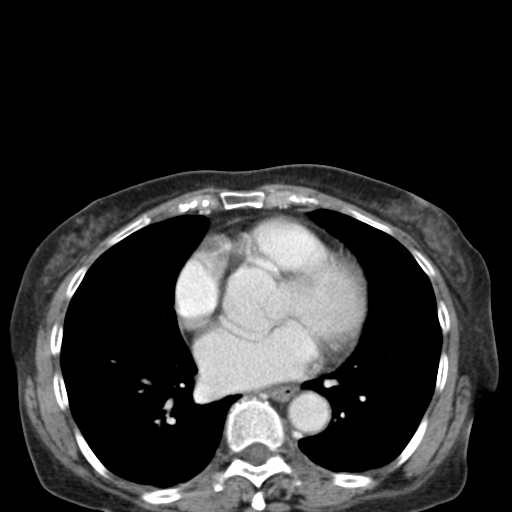

[Series 602: cor · coronal · 0.77mm/px · 3 of 92 slices shown]
[im 31/92  soft-tissue]
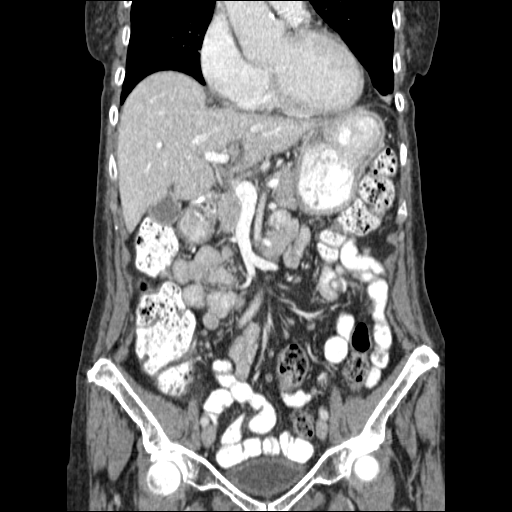
[im 41/92  soft-tissue]
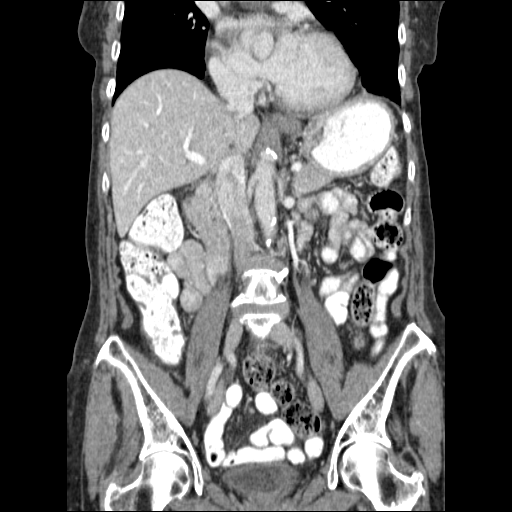
[im 51/92  soft-tissue]
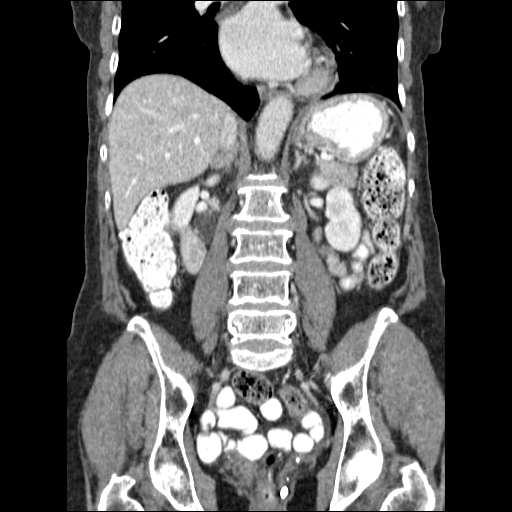

[17 of 46 positions shown; findings below may reference images not displayed]

FINDINGS: Lower chest: Clear lung bases. No significant pleural or pericardial
effusion. The heart is mildly enlarged. Atherosclerosis of the aorta
and coronary arteries noted.

Hepatobiliary: The liver is normal in density without focal
abnormality. No evidence of gallstones, gallbladder wall thickening
or biliary dilatation.

Pancreas: Unremarkable. No pancreatic ductal dilatation or
surrounding inflammatory changes.

Spleen: Normal in size without focal abnormality.

Adrenals/Urinary Tract: Both adrenal glands appear normal. Both
kidneys appear normal without evidence of urinary tract calculus,
hydronephrosis or focal cortical lesion. There is an extrarenal
pelvis on the right. The bladder appears unremarkable.

Stomach/Bowel: No evidence of bowel wall thickening, distention or
surrounding inflammatory change. Moderate stool throughout the
colon. By history, previous appendectomy.

Vascular/Lymphatic: There are no enlarged abdominal or pelvic lymph
nodes. Mild aortoiliac atherosclerosis.

Reproductive: Hysterectomy.  No evidence of adnexal mass.

Other: Small umbilical hernia containing fat. No evidence of
ascites.

Musculoskeletal: No acute or significant osseous findings.
IMPRESSION: 1. No acute findings or explanation for the patient's symptoms.
2. Mild atherosclerosis as described.
3. Previous hysterectomy and appendectomy.

## 2017-10-08 ENCOUNTER — Encounter: Payer: Self-pay | Admitting: Internal Medicine

## 2017-10-08 ENCOUNTER — Ambulatory Visit (INDEPENDENT_AMBULATORY_CARE_PROVIDER_SITE_OTHER): Payer: Medicare Other | Admitting: Internal Medicine

## 2017-10-08 ENCOUNTER — Other Ambulatory Visit (INDEPENDENT_AMBULATORY_CARE_PROVIDER_SITE_OTHER): Payer: Medicare Other

## 2017-10-08 DIAGNOSIS — E559 Vitamin D deficiency, unspecified: Secondary | ICD-10-CM | POA: Diagnosis not present

## 2017-10-08 DIAGNOSIS — J4520 Mild intermittent asthma, uncomplicated: Secondary | ICD-10-CM | POA: Diagnosis not present

## 2017-10-08 DIAGNOSIS — M8000XS Age-related osteoporosis with current pathological fracture, unspecified site, sequela: Secondary | ICD-10-CM

## 2017-10-08 DIAGNOSIS — I950 Idiopathic hypotension: Secondary | ICD-10-CM | POA: Diagnosis not present

## 2017-10-08 DIAGNOSIS — W19XXXS Unspecified fall, sequela: Secondary | ICD-10-CM

## 2017-10-08 LAB — BASIC METABOLIC PANEL
BUN: 14 mg/dL (ref 6–23)
CO2: 29 mEq/L (ref 19–32)
Calcium: 9.7 mg/dL (ref 8.4–10.5)
Chloride: 100 mEq/L (ref 96–112)
Creatinine, Ser: 0.8 mg/dL (ref 0.40–1.20)
GFR: 88.82 mL/min (ref >60.00)
Glucose, Bld: 93 mg/dL (ref 70–99)
POTASSIUM: 3.6 meq/L (ref 3.5–5.1)
SODIUM: 136 meq/L (ref 135–145)

## 2017-10-08 NOTE — Assessment & Plan Note (Signed)
BP - nl

## 2017-10-08 NOTE — Assessment & Plan Note (Signed)
Chronic On Vit D Core strengthening exercises  Prolia discussed again

## 2017-10-08 NOTE — Assessment & Plan Note (Signed)
On Vit D 

## 2017-10-08 NOTE — Assessment & Plan Note (Addendum)
[place] form

## 2017-10-08 NOTE — Assessment & Plan Note (Signed)
Advair 

## 2017-10-08 NOTE — Progress Notes (Signed)
Subjective:  Patient ID: Cindy Meza, female    DOB: Aug 09, 1935  Age: 82 y.o. MRN: [id number]  CC: No chief complaint on file.   HPI Cindy Meza presents for OA, asthma, osteoporosis f/u   Outpatient Medications Prior to Visit  Medication Sig Dispense Refill  . albuterol (PROVENTIL HFA;VENTOLIN HFA) 108 (90 Base) MCG/ACT inhaler Inhale 1-2 puffs into the lungs every 6 (six) hours.    Marland Kitchen aspirin 81 MG tablet Take 81 mg by mouth daily.    . Calcium-Magnesium-Vitamin D (CALCIUM 500 PO) Take 1 each by mouth daily.    . Cholecalciferol 1000 UNITS tablet Take 1,000 Units by mouth daily.      . fluticasone-salmeterol (ADVAIR HFA) 115-21 MCG/ACT inhaler Inhale 2 puffs into the lungs 2 (two) times daily. 1 Inhaler 11  . loratadine (CLARITIN) 10 MG tablet Take 10 mg by mouth daily.     Marland Kitchen MEGARED OMEGA-3 KRILL OIL 500 MG CAPS Take 1 capsule by mouth every morning. 100 capsule 3  . omeprazole (PRILOSEC) 40 MG capsule Take 1 capsule (40 mg total) daily by mouth. 90 capsule 3  . ranitidine (ZANTAC) 150 MG tablet Take 1 tablet (150 mg total) by mouth 2 (two) times daily. 60 tablet 11  . hydrocortisone (ANUSOL-HC) 2.5 % rectal cream Place 1 application rectally 2 (two) times daily. (Patient not taking: Reported on 10/08/2017) 30 g 0   Facility-Administered Medications Prior to Visit  Medication Dose Route Frequency Provider Last Rate Last Dose  . 0.9 %  sodium chloride infusion  500 mL Intravenous Once Gatha Mayer, MD        ROS Review of Systems  Constitutional: Positive for fatigue. Negative for activity change, appetite change, chills and unexpected weight change.  HENT: Negative for congestion, mouth sores and sinus pressure.   Eyes: Negative for visual disturbance.  Respiratory: Negative for cough and chest tightness.   Cardiovascular: Negative for leg swelling.  Gastrointestinal: Negative for abdominal pain and nausea.  Genitourinary: Negative for difficulty urinating, frequency  and vaginal pain.  Musculoskeletal: Positive for arthralgias and gait problem. Negative for back pain.  Skin: Negative for pallor and rash.  Neurological: Negative for dizziness, tremors, weakness, numbness and headaches.  Psychiatric/Behavioral: Negative for confusion, sleep disturbance and suicidal ideas.    Objective:  BP 128/78 (BP Location: Left Arm, Patient Position: Sitting, Cuff Size: Normal)   Pulse 82   Temp 98 F (36.7 C) (Oral)   Ht 5\' 3"  (1.6 m)   Wt 117 lb (53.1 kg)   SpO2 100%   BMI 20.73 kg/m   BP Readings from Last 3 Encounters:  10/08/17 128/78  09/05/17 (!) 141/71  04/09/17 130/74    Wt Readings from Last 3 Encounters:  10/08/17 117 lb (53.1 kg)  09/05/17 115 lb (52.2 kg)  08/22/17 119 lb 6.4 oz (54.2 kg)    Physical Exam  Constitutional: She appears well-developed. No distress.  HENT:  Head: Normocephalic.  Right Ear: External ear normal.  Left Ear: External ear normal.  Nose: Nose normal.  Mouth/Throat: Oropharynx is clear and moist.  Eyes: Pupils are equal, round, and reactive to light. Conjunctivae are normal. Right eye exhibits no discharge. Left eye exhibits no discharge.  Neck: Normal range of motion. Neck supple. No JVD present. No tracheal deviation present. No thyromegaly present.  Cardiovascular: Normal rate, regular rhythm and normal heart sounds.  Pulmonary/Chest: No stridor. No respiratory distress. She has no wheezes.  Abdominal: Soft. Bowel sounds are normal.  She exhibits no distension and no mass. There is no tenderness. There is no rebound and no guarding.  Musculoskeletal: She exhibits no edema or tenderness.  Lymphadenopathy:    She has no cervical adenopathy.  Neurological: She displays normal reflexes. No cranial nerve deficit. She exhibits normal muscle tone. Coordination abnormal.  Skin: No rash noted. No erythema.  Psychiatric: She has a normal mood and affect. Her behavior is normal. Judgment and thought content normal.    cane  Lab Results  Component Value Date   WBC 7.1 04/09/2017   HGB 12.1 04/09/2017   HCT 37.0 04/09/2017   PLT 262.0 04/09/2017   GLUCOSE 131 (H) 04/09/2017   CHOL 256 (H) 03/23/2014   TRIG 54.0 03/23/2014   HDL 129.60 03/23/2014   LDLDIRECT 104.0 03/17/2013   LDLCALC 116 (H) 03/23/2014   ALT 10 03/30/2015   AST 17 03/30/2015   NA 138 04/09/2017   K 3.7 04/09/2017   CL 101 04/09/2017   CREATININE 0.80 04/09/2017   BUN 14 04/09/2017   CO2 32 04/09/2017   TSH 1.74 01/04/2017   INR 1.04 06/12/2016   HGBA1C 4.9 04/09/2017    Dg Chest 1 View  Result Date: 06/12/2016 CLINICAL DATA:  82 y/o  F; status post fall. EXAM: CHEST 1 VIEW COMPARISON:  01/16/2006 chest radiograph FINDINGS: Stable cardiac silhouette within normal limits given projection and technique. Aortic atherosclerosis with arch calcification. Clear lungs. No acute osseous abnormality is evident IMPRESSION: No active disease. Electronically Signed   By: Kristine Garbe M.D.   On: 06/12/2016 22:41   Dg C-arm 1-60 Min  Result Date: 06/13/2016 CLINICAL DATA:  82 year old female undergoing right femur ORIF. Initial encounter. EXAM: DG C-ARM 61-120 MIN; RIGHT FEMUR 2 VIEWS COMPARISON:  Right femur series 12517. FINDINGS: 4 intraoperative fluoroscopic spot views of the right femur done demonstrating intra medullary rod placement with proximal interlocking dynamic hip screw and distal interlocking cortical screw. Improved alignment about the comminuted intertrochanteric fracture. Hardware appears intact. FLUOROSCOPY TIME:  2 minutes 22 seconds IMPRESSION: Right femur ORIF with improved intertrochanteric fracture alignment. Electronically Signed   By: Genevie Ann M.D.   On: 06/13/2016 14:06   Dg Hip Unilat  With Pelvis 2-3 Views Right  Result Date: 06/12/2016 CLINICAL DATA:  Initial evaluation for acute trauma. EXAM: DG HIP (WITH OR WITHOUT PELVIS) 2-3V RIGHT COMPARISON:  None. FINDINGS: There is an acute mildly displaced  intertrochanteric fracture of the right hip. Right femoral head remains normally position within the acetabulum. Femoral head height preserved. Limited views of the left hip demonstrate no acute osseous abnormality. 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 Bony pelvis intact. SI joints approximated. IMPRESSION: Acute mildly displaced intertrochanteric fracture of the right hip. Electronically Signed   By: Jeannine Boga M.D.   On: 06/12/2016 21:11   Dg Femur 1v Right  Result Date: 06/12/2016 CLINICAL DATA:  82 y/o  F; hip fracture EXAM: RIGHT FEMUR 1 VIEW COMPARISON:  None. FINDINGS: Partially visualized displaced and angulated intertrochanteric fracture of the right hip. The femoral shaft is otherwise intact. The visualized knee joint is grossly maintained. IMPRESSION: Partially visualized displaced and angulated intertrochanteric fracture of the right hip. The femoral shaft is otherwise intact. Electronically Signed   By: Kristine Garbe M.D.   On: 06/12/2016 22:43   Dg Femur, Min 2 Views Right  Result Date: 06/13/2016 CLINICAL DATA:  82 year old female undergoing right femur ORIF. Initial encounter. EXAM: DG C-ARM 61-120 MIN; RIGHT FEMUR 2 VIEWS COMPARISON:  Right femur series 12517. FINDINGS: 4 intraoperative fluoroscopic spot views of the right femur done demonstrating intra medullary rod placement with proximal interlocking dynamic hip screw and distal interlocking cortical screw. Improved alignment about the comminuted intertrochanteric fracture. Hardware appears intact. FLUOROSCOPY TIME:  2 minutes 22 seconds IMPRESSION: Right femur ORIF with improved intertrochanteric fracture alignment. Electronically Signed   By: Genevie Ann M.D.   On: 06/13/2016 14:06   Dg Femur Port, Min 2 Views Right  Result Date: 06/13/2016 CLINICAL DATA:  IM nail. EXAM: RIGHT FEMUR PORTABLE 2 VIEW COMPARISON:  06/12/2016 FINDINGS: Internal fixation across the comminuted right femoral  intertrochanteric fracture. Fracture fragments remain mildly displaced. Decreasing varus angulation. No hardware complicating feature. IMPRESSION: Internal fixation across the comminuted right femoral intertrochanteric fracture with continued mild displacement of fracture fragments. Electronically Signed   By: Rolm Baptise M.D.   On: 06/13/2016 15:08    Assessment & Plan:   There are no diagnoses linked to this encounter. I have discontinued Izora Gala C. Segundo's hydrocortisone. I am also having her maintain her loratadine, Cholecalciferol, Calcium-Magnesium-Vitamin D (CALCIUM 500 PO), MEGARED OMEGA-3 KRILL OIL, albuterol, aspirin, ranitidine, fluticasone-salmeterol, and omeprazole. We will continue to administer sodium chloride.  No orders of the defined types were placed in this encounter.    Follow-up: No follow-ups on file.  [name], MD

## 2018-01-29 LAB — HM MAMMOGRAPHY

## 2018-01-31 ENCOUNTER — Encounter: Payer: Self-pay | Admitting: Internal Medicine

## 2018-04-10 ENCOUNTER — Other Ambulatory Visit (INDEPENDENT_AMBULATORY_CARE_PROVIDER_SITE_OTHER): Payer: Medicare Other

## 2018-04-10 ENCOUNTER — Encounter: Payer: Self-pay | Admitting: Internal Medicine

## 2018-04-10 ENCOUNTER — Ambulatory Visit (INDEPENDENT_AMBULATORY_CARE_PROVIDER_SITE_OTHER): Payer: Medicare Other | Admitting: Internal Medicine

## 2018-04-10 VITALS — BP 128/78 | HR 67 | Temp 98.0°F | Ht 63.0 in | Wt 118.0 lb

## 2018-04-10 DIAGNOSIS — Z0000 Encounter for general adult medical examination without abnormal findings: Secondary | ICD-10-CM

## 2018-04-10 DIAGNOSIS — I2510 Atherosclerotic heart disease of native coronary artery without angina pectoris: Secondary | ICD-10-CM | POA: Diagnosis not present

## 2018-04-10 DIAGNOSIS — W19XXXS Unspecified fall, sequela: Secondary | ICD-10-CM

## 2018-04-10 DIAGNOSIS — I950 Idiopathic hypotension: Secondary | ICD-10-CM | POA: Diagnosis not present

## 2018-04-10 DIAGNOSIS — H6123 Impacted cerumen, bilateral: Secondary | ICD-10-CM

## 2018-04-10 DIAGNOSIS — Z23 Encounter for immunization: Secondary | ICD-10-CM | POA: Diagnosis not present

## 2018-04-10 DIAGNOSIS — M8000XS Age-related osteoporosis with current pathological fracture, unspecified site, sequela: Secondary | ICD-10-CM

## 2018-04-10 LAB — CBC WITH DIFFERENTIAL/PLATELET
BASOS ABS: 0 10*3/uL (ref 0.0–0.1)
Basophils Relative: 0.8 % (ref 0.0–3.0)
EOS ABS: 0.2 10*3/uL (ref 0.0–0.7)
Eosinophils Relative: 3.7 % (ref 0.0–5.0)
HEMATOCRIT: 37.6 % (ref 36.0–46.0)
HEMOGLOBIN: 12.5 g/dL (ref 12.0–15.0)
LYMPHS PCT: 37.2 % (ref 12.0–46.0)
Lymphs Abs: 2.1 10*3/uL (ref 0.7–4.0)
MCHC: 33.2 g/dL (ref 30.0–36.0)
MCV: 94.8 fl (ref 78.0–100.0)
Monocytes Absolute: 0.4 10*3/uL (ref 0.1–1.0)
Monocytes Relative: 8 % (ref 3.0–12.0)
NEUTROS ABS: 2.8 10*3/uL (ref 1.4–7.7)
Neutrophils Relative %: 50.3 % (ref 43.0–77.0)
PLATELETS: 271 10*3/uL (ref 150.0–400.0)
RBC: 3.97 Mil/uL (ref 3.87–5.11)
RDW: 12.4 % (ref 11.5–15.5)
WBC: 5.5 10*3/uL (ref 4.0–10.5)

## 2018-04-10 LAB — HEPATIC FUNCTION PANEL
ALT: 11 U/L (ref 0–35)
AST: 20 U/L (ref 0–37)
Albumin: 4.2 g/dL (ref 3.5–5.2)
Alkaline Phosphatase: 48 U/L (ref 39–117)
BILIRUBIN TOTAL: 0.4 mg/dL (ref 0.2–1.2)
Bilirubin, Direct: 0.1 mg/dL (ref 0.0–0.3)
TOTAL PROTEIN: 7.9 g/dL (ref 6.0–8.3)

## 2018-04-10 LAB — URINALYSIS, ROUTINE W REFLEX MICROSCOPIC
Bilirubin Urine: NEGATIVE
Hgb urine dipstick: NEGATIVE
Ketones, ur: NEGATIVE
Nitrite: NEGATIVE
PH: 6.5 (ref 5.0–8.0)
RBC / HPF: NONE SEEN (ref 0–?)
SPECIFIC GRAVITY, URINE: 1.01 (ref 1.000–1.030)
TOTAL PROTEIN, URINE-UPE24: NEGATIVE
URINE GLUCOSE: NEGATIVE
Urobilinogen, UA: 0.2 (ref 0.0–1.0)

## 2018-04-10 LAB — LIPID PANEL
CHOL/HDL RATIO: 2
Cholesterol: 258 mg/dL — ABNORMAL HIGH (ref 0–200)
HDL: 119.6 mg/dL (ref >39.00)
LDL Cholesterol: 124 mg/dL — ABNORMAL HIGH (ref 0–99)
NonHDL: 138.03
TRIGLYCERIDES: 68 mg/dL (ref 0.0–149.0)
VLDL: 13.6 mg/dL (ref 0.0–40.0)

## 2018-04-10 LAB — BASIC METABOLIC PANEL
BUN: 13 mg/dL (ref 6–23)
CHLORIDE: 102 meq/L (ref 96–112)
CO2: 32 mEq/L (ref 19–32)
Calcium: 9.6 mg/dL (ref 8.4–10.5)
Creatinine, Ser: 0.85 mg/dL (ref 0.40–1.20)
GFR: 82.71 mL/min (ref >60.00)
Glucose, Bld: 102 mg/dL — ABNORMAL HIGH (ref 70–99)
Potassium: 3.9 mEq/L (ref 3.5–5.1)
Sodium: 138 mEq/L (ref 135–145)

## 2018-04-10 LAB — CORTISOL: CORTISOL PLASMA: 9 ug/dL

## 2018-04-10 LAB — TSH: TSH: 1.37 u[IU]/mL (ref 0.35–4.50)

## 2018-04-10 NOTE — Assessment & Plan Note (Signed)
Vit D Undecided otherwise

## 2018-04-10 NOTE — Assessment & Plan Note (Signed)
   Here for medicare wellness/physical  Diet: heart healthy  Physical activity: not sedentary  Depression/mood screen: negative  Hearing: intact to whispered voice  Visual acuity: grossly normal - reading glasses, performs annual eye exam ADLs: capable  Fall risk: low - using a cane  Home safety: good  Cognitive evaluation: intact to orientation, naming, recall and repetition  EOL planning: adv directives, full code/ I agree  I have personally reviewed and have noted  1. The patient's medical, surgical and social history  2. Their use of alcohol, tobacco or illicit drugs  3. Their current medications and supplements  4. The patient's functional ability including ADL's, fall risks, home safety risks and hearing or visual impairment.  5. Diet and physical activities  6. Evidence for depression or mood disorders 7. The roster of all physicians providing medical care to patient - is listed in the Snapshot section of the chart and reviewed today.    Today patient counseled on age appropriate routine health concerns for screening and prevention, each reviewed and up to date or declined. Immunizations reviewed and up to date or declined. Labs ordered and reviewed. Risk factors for depression reviewed and negative. Hearing function and visual acuity are intact. ADLs screened and addressed as needed. Functional ability and level of safety reviewed and appropriate. Education, counseling and referrals performed based on assessed risks today. Patient provided with a copy of personalized plan for preventive services.

## 2018-04-10 NOTE — Assessment & Plan Note (Signed)
No falls

## 2018-04-10 NOTE — Assessment & Plan Note (Signed)
See procedure 

## 2018-04-10 NOTE — Assessment & Plan Note (Signed)
ASA, krill oil 

## 2018-04-10 NOTE — Progress Notes (Signed)
Subjective:  Patient ID: Cindy Meza, female    DOB: [birth year]  Age: 82 y.o. MRN: [id number]  CC: No chief complaint on file.   HPI TANNER VIGNA presents for a well exam C/o low BP drops w/SBP in the 80s range at times - woozy feeling  Outpatient Medications Prior to Visit  Medication Sig Dispense Refill  . albuterol (PROVENTIL HFA;VENTOLIN HFA) 108 (90 Base) MCG/ACT inhaler Inhale 1-2 puffs into the lungs every 6 (six) hours.    Marland Kitchen aspirin 81 MG tablet Take 81 mg by mouth daily.    . Calcium-Magnesium-Vitamin D (CALCIUM 500 PO) Take 1 each by mouth daily.    . Cholecalciferol 1000 UNITS tablet Take 1,000 Units by mouth daily.      . Difluprednate (DUREZOL OP) Apply to eye.    . fluticasone-salmeterol (ADVAIR HFA) 115-21 MCG/ACT inhaler Inhale 2 puffs into the lungs 2 (two) times daily. 1 Inhaler 11  . loratadine (CLARITIN) 10 MG tablet Take 10 mg by mouth daily.     Marland Kitchen MEGARED OMEGA-3 KRILL OIL 500 MG CAPS Take 1 capsule by mouth every morning. 100 capsule 3  . ofloxacin (OCUFLOX) 0.3 % ophthalmic solution 1 drop 2 (two) times daily.    Marland Kitchen omeprazole (PRILOSEC) 40 MG capsule Take 1 capsule (40 mg total) daily by mouth. 90 capsule 3  . ranitidine (ZANTAC) 150 MG tablet Take 1 tablet (150 mg total) by mouth 2 (two) times daily. (Patient not taking: Reported on 04/10/2018) 60 tablet 11   No facility-administered medications prior to visit.     ROS: Review of Systems  Constitutional: Positive for fatigue. Negative for activity change, appetite change, chills and unexpected weight change.  HENT: Negative for congestion, mouth sores and sinus pressure.   Eyes: Negative for visual disturbance.  Respiratory: Negative for cough and chest tightness.   Gastrointestinal: Negative for abdominal pain and nausea.  Genitourinary: Negative for difficulty urinating, frequency and vaginal pain.  Musculoskeletal: Negative for back pain and gait problem.  Skin: Negative for pallor and rash.    Neurological: Positive for light-headedness. Negative for dizziness, tremors, weakness, numbness and headaches.  Psychiatric/Behavioral: Negative for confusion, sleep disturbance and suicidal ideas.    Objective:  BP 128/78 (BP Location: Left Arm, Patient Position: Sitting, Cuff Size: Normal)   Pulse 67   Temp 98 F (36.7 C) (Oral)   Ht 5\' 3"  (1.6 m)   Wt 118 lb (53.5 kg)   SpO2 98%   BMI 20.90 kg/m   BP Readings from Last 3 Encounters:  04/10/18 128/78  10/08/17 128/78  09/05/17 (!) 141/71    Wt Readings from Last 3 Encounters:  04/10/18 118 lb (53.5 kg)  10/08/17 117 lb (53.1 kg)  09/05/17 115 lb (52.2 kg)    Physical Exam  Constitutional: She appears well-developed. No distress.  HENT:  Head: Normocephalic.  Right Ear: External ear normal.  Left Ear: External ear normal.  Nose: Nose normal.  Mouth/Throat: Oropharynx is clear and moist.  Eyes: Pupils are equal, round, and reactive to light. Conjunctivae are normal. Right eye exhibits no discharge. Left eye exhibits no discharge.  Neck: Normal range of motion. Neck supple. No JVD present. No tracheal deviation present. No thyromegaly present.  Cardiovascular: Normal rate, regular rhythm and normal heart sounds.  Pulmonary/Chest: No stridor. No respiratory distress. She has no wheezes.  Abdominal: Soft. Bowel sounds are normal. She exhibits no distension and no mass. There is no tenderness. There is no rebound and no  guarding.  Musculoskeletal: She exhibits no edema or tenderness.  Lymphadenopathy:    She has no cervical adenopathy.  Neurological: She displays normal reflexes. No cranial nerve deficit. She exhibits normal muscle tone. Coordination normal.  Skin: No rash noted. No erythema.  Psychiatric: She has a normal mood and affect. Her behavior is normal. Judgment and thought content normal.  B wax   Procedure Note :     Procedure :  Ear irrigation   Indication:  Cerumen impaction   Risks, including pain,  dizziness, eardrum perforation, bleeding, infection and others as well as benefits were explained to the patient in detail. Verbal consent was obtained and the patient agreed to proceed.    We used "The Elephant Ear Irrigation Device" filled with lukewarm water for irrigation. A large amount wax was recovered. Procedure has also required manual wax removal with an ear wax curette and ear forceps.   Tolerated well. Complications: None.   Postprocedure instructions :  Call if problems.    Lab Results  Component Value Date   WBC 7.1 04/09/2017   HGB 12.1 04/09/2017   HCT 37.0 04/09/2017   PLT 262.0 04/09/2017   GLUCOSE 93 10/08/2017   CHOL 256 (H) 03/23/2014   TRIG 54.0 03/23/2014   HDL 129.60 03/23/2014   LDLDIRECT 104.0 03/17/2013   LDLCALC 116 (H) 03/23/2014   ALT 10 03/30/2015   AST 17 03/30/2015   NA 136 10/08/2017   K 3.6 10/08/2017   CL 100 10/08/2017   CREATININE 0.80 10/08/2017   BUN 14 10/08/2017   CO2 29 10/08/2017   TSH 1.74 01/04/2017   INR 1.04 06/12/2016   HGBA1C 4.9 04/09/2017    Dg Chest 1 View  Result Date: 06/12/2016 CLINICAL DATA:  82 y/o  F; status post fall. EXAM: CHEST 1 VIEW COMPARISON:  01/16/2006 chest radiograph FINDINGS: Stable cardiac silhouette within normal limits given projection and technique. Aortic atherosclerosis with arch calcification. Clear lungs. No acute osseous abnormality is evident IMPRESSION: No active disease. Electronically Signed   By: Kristine Garbe M.D.   On: 06/12/2016 22:41   Dg C-arm 1-60 Min  Result Date: 06/13/2016 CLINICAL DATA:  82 year old female undergoing right femur ORIF. Initial encounter. EXAM: DG C-ARM 61-120 MIN; RIGHT FEMUR 2 VIEWS COMPARISON:  Right femur series 12517. FINDINGS: 4 intraoperative fluoroscopic spot views of the right femur done demonstrating intra medullary rod placement with proximal interlocking dynamic hip screw and distal interlocking cortical screw. Improved alignment about the  comminuted intertrochanteric fracture. Hardware appears intact. FLUOROSCOPY TIME:  2 minutes 22 seconds IMPRESSION: Right femur ORIF with improved intertrochanteric fracture alignment. Electronically Signed   By: Genevie Ann M.D.   On: 06/13/2016 14:06   Dg Hip Unilat  With Pelvis 2-3 Views Right  Result Date: 06/12/2016 CLINICAL DATA:  Initial evaluation for acute trauma. EXAM: DG HIP (WITH OR WITHOUT PELVIS) 2-3V RIGHT COMPARISON:  None. FINDINGS: There is an acute mildly displaced intertrochanteric fracture of the right hip. Right femoral head remains normally position within the acetabulum. Femoral head height preserved. Limited views of the left hip demonstrate no acute osseous abnormality. 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 Bony pelvis intact. SI joints approximated. IMPRESSION: Acute mildly displaced intertrochanteric fracture of the right hip. Electronically Signed   By: Jeannine Boga M.D.   On: 06/12/2016 21:11   Dg Femur 1v Right  Result Date: 06/12/2016 CLINICAL DATA:  82 y/o  F; hip fracture EXAM: RIGHT FEMUR 1 VIEW COMPARISON:  None. FINDINGS: Partially  visualized displaced and angulated intertrochanteric fracture of the right hip. The femoral shaft is otherwise intact. The visualized knee joint is grossly maintained. IMPRESSION: Partially visualized displaced and angulated intertrochanteric fracture of the right hip. The femoral shaft is otherwise intact. Electronically Signed   By: Kristine Garbe M.D.   On: 06/12/2016 22:43   Dg Femur, Min 2 Views Right  Result Date: 06/13/2016 CLINICAL DATA:  82 year old female undergoing right femur ORIF. Initial encounter. EXAM: DG C-ARM 61-120 MIN; RIGHT FEMUR 2 VIEWS COMPARISON:  Right femur series 12517. FINDINGS: 4 intraoperative fluoroscopic spot views of the right femur done demonstrating intra medullary rod placement with proximal interlocking dynamic hip screw and distal interlocking cortical screw.  Improved alignment about the comminuted intertrochanteric fracture. Hardware appears intact. FLUOROSCOPY TIME:  2 minutes 22 seconds IMPRESSION: Right femur ORIF with improved intertrochanteric fracture alignment. Electronically Signed   By: Genevie Ann M.D.   On: 06/13/2016 14:06   Dg Femur Port, Min 2 Views Right  Result Date: 06/13/2016 CLINICAL DATA:  IM nail. EXAM: RIGHT FEMUR PORTABLE 2 VIEW COMPARISON:  06/12/2016 FINDINGS: Internal fixation across the comminuted right femoral intertrochanteric fracture. Fracture fragments remain mildly displaced. Decreasing varus angulation. No hardware complicating feature. IMPRESSION: Internal fixation across the comminuted right femoral intertrochanteric fracture with continued mild displacement of fracture fragments. Electronically Signed   By: Rolm Baptise M.D.   On: 06/13/2016 15:08    Assessment & Plan:   There are no diagnoses linked to this encounter.   No orders of the defined types were placed in this encounter.    Follow-up: No follow-ups on file.  [name], MD

## 2018-04-10 NOTE — Patient Instructions (Signed)
Health Maintenance for Postmenopausal Women Menopause is a normal process in which your reproductive ability comes to an end. This process happens gradually over a span of months to years, usually between the ages of 22 and 9. Menopause is complete when you have missed 12 consecutive menstrual periods. It is important to talk with your health care provider about some of the most common conditions that affect postmenopausal women, such as heart disease, cancer, and bone loss (osteoporosis). Adopting a healthy lifestyle and getting preventive care can help to promote your health and wellness. Those actions can also lower your chances of developing some of these common conditions. What should I know about menopause? During menopause, you may experience a number of symptoms, such as:  Moderate-to-severe hot flashes.  Night sweats.  Decrease in sex drive.  Mood swings.  Headaches.  Tiredness.  Irritability.  Memory problems.  Insomnia.  Choosing to treat or not to treat menopausal changes is an individual decision that you make with your health care provider. What should I know about hormone replacement therapy and supplements? Hormone therapy products are effective for treating symptoms that are associated with menopause, such as hot flashes and night sweats. Hormone replacement carries certain risks, especially as you become older. If you are thinking about using estrogen or estrogen with progestin treatments, discuss the benefits and risks with your health care provider. What should I know about heart disease and stroke? Heart disease, heart attack, and stroke become more likely as you age. This may be due, in part, to the hormonal changes that your body experiences during menopause. These can affect how your body processes dietary fats, triglycerides, and cholesterol. Heart attack and stroke are both medical emergencies. There are many things that you can do to help prevent heart disease  and stroke:  Have your blood pressure checked at least every 1-2 years. High blood pressure causes heart disease and increases the risk of stroke.  If you are 53-22 years old, ask your health care provider if you should take aspirin to prevent a heart attack or a stroke.  Do not use any tobacco products, including cigarettes, chewing tobacco, or electronic cigarettes. If you need help quitting, ask your health care provider.  It is important to eat a healthy diet and maintain a healthy weight. ? Be sure to include plenty of vegetables, fruits, low-fat dairy products, and lean protein. ? Avoid eating foods that are high in solid fats, added sugars, or salt (sodium).  Get regular exercise. This is one of the most important things that you can do for your health. ? Try to exercise for at least 150 minutes each week. The type of exercise that you do should increase your heart rate and make you sweat. This is known as moderate-intensity exercise. ? Try to do strengthening exercises at least twice each week. Do these in addition to the moderate-intensity exercise.  Know your numbers.Ask your health care provider to check your cholesterol and your blood glucose. Continue to have your blood tested as directed by your health care provider.  What should I know about cancer screening? There are several types of cancer. Take the following steps to reduce your risk and to catch any cancer development as early as possible. Breast Cancer  Practice breast self-awareness. ? This means understanding how your breasts normally appear and feel. ? It also means doing regular breast self-exams. Let your health care provider know about any changes, no matter how small.  If you are 40  or older, have a clinician do a breast exam (clinical breast exam or CBE) every year. Depending on your age, family history, and medical history, it may be recommended that you also have a yearly breast X-ray (mammogram).  If you  have a family history of breast cancer, talk with your health care provider about genetic screening.  If you are at high risk for breast cancer, talk with your health care provider about having an MRI and a mammogram every year.  Breast cancer (BRCA) gene test is recommended for women who have family members with BRCA-related cancers. Results of the assessment will determine the need for genetic counseling and BRCA1 and for BRCA2 testing. BRCA-related cancers include these types: ? Breast. This occurs in males or females. ? Ovarian. ? Tubal. This may also be called fallopian tube cancer. ? Cancer of the abdominal or pelvic lining (peritoneal cancer). ? Prostate. ? Pancreatic.  Cervical, Uterine, and Ovarian Cancer Your health care provider may recommend that you be screened regularly for cancer of the pelvic organs. These include your ovaries, uterus, and vagina. This screening involves a pelvic exam, which includes checking for microscopic changes to the surface of your cervix (Pap test).  For women ages 21-65, health care providers may recommend a pelvic exam and a Pap test every three years. For women ages 79-65, they may recommend the Pap test and pelvic exam, combined with testing for human papilloma virus (HPV), every five years. Some types of HPV increase your risk of cervical cancer. Testing for HPV may also be done on women of any age who have unclear Pap test results.  Other health care providers may not recommend any screening for nonpregnant women who are considered low risk for pelvic cancer and have no symptoms. Ask your health care provider if a screening pelvic exam is right for you.  If you have had past treatment for cervical cancer or a condition that could lead to cancer, you need Pap tests and screening for cancer for at least 20 years after your treatment. If Pap tests have been discontinued for you, your risk factors (such as having a new sexual partner) need to be  reassessed to determine if you should start having screenings again. Some women have medical problems that increase the chance of getting cervical cancer. In these cases, your health care provider may recommend that you have screening and Pap tests more often.  If you have a family history of uterine cancer or ovarian cancer, talk with your health care provider about genetic screening.  If you have vaginal bleeding after reaching menopause, tell your health care provider.  There are currently no reliable tests available to screen for ovarian cancer.  Lung Cancer Lung cancer screening is recommended for adults 69-62 years old who are at high risk for lung cancer because of a history of smoking. A yearly low-dose CT scan of the lungs is recommended if you:  Currently smoke.  Have a history of at least 30 pack-years of smoking and you currently smoke or have quit within the past 15 years. A pack-year is smoking an average of one pack of cigarettes per day for one year.  Yearly screening should:  Continue until it has been 15 years since you quit.  Stop if you develop a health problem that would prevent you from having lung cancer treatment.  Colorectal Cancer  This type of cancer can be detected and can often be prevented.  Routine colorectal cancer screening usually begins at  age 42 and continues through age 45.  If you have risk factors for colon cancer, your health care provider may recommend that you be screened at an earlier age.  If you have a family history of colorectal cancer, talk with your health care provider about genetic screening.  Your health care provider may also recommend using home test kits to check for hidden blood in your stool.  A small camera at the end of a tube can be used to examine your colon directly (sigmoidoscopy or colonoscopy). This is done to check for the earliest forms of colorectal cancer.  Direct examination of the colon should be repeated every  5-10 years until age 71. However, if early forms of precancerous polyps or small growths are found or if you have a family history or genetic risk for colorectal cancer, you may need to be screened more often.  Skin Cancer  Check your skin from head to toe regularly.  Monitor any moles. Be sure to tell your health care provider: ? About any new moles or changes in moles, especially if there is a change in a mole's shape or color. ? If you have a mole that is larger than the size of a pencil eraser.  If any of your family members has a history of skin cancer, especially at a young age, talk with your health care provider about genetic screening.  Always use sunscreen. Apply sunscreen liberally and repeatedly throughout the day.  Whenever you are outside, protect yourself by wearing long sleeves, pants, a wide-brimmed hat, and sunglasses.  What should I know about osteoporosis? Osteoporosis is a condition in which bone destruction happens more quickly than new bone creation. After menopause, you may be at an increased risk for osteoporosis. To help prevent osteoporosis or the bone fractures that can happen because of osteoporosis, the following is recommended:  If you are 46-71 years old, get at least 1,000 mg of calcium and at least 600 mg of vitamin D per day.  If you are older than age 55 but younger than age 65, get at least 1,200 mg of calcium and at least 600 mg of vitamin D per day.  If you are older than age 54, get at least 1,200 mg of calcium and at least 800 mg of vitamin D per day.  Smoking and excessive alcohol intake increase the risk of osteoporosis. Eat foods that are rich in calcium and vitamin D, and do weight-bearing exercises several times each week as directed by your health care provider. What should I know about how menopause affects my mental health? Depression may occur at any age, but it is more common as you become older. Common symptoms of depression  include:  Low or sad mood.  Changes in sleep patterns.  Changes in appetite or eating patterns.  Feeling an overall lack of motivation or enjoyment of activities that you previously enjoyed.  Frequent crying spells.  Talk with your health care provider if you think that you are experiencing depression. What should I know about immunizations? It is important that you get and maintain your immunizations. These include:  Tetanus, diphtheria, and pertussis (Tdap) booster vaccine.  Influenza every year before the flu season begins.  Pneumonia vaccine.  Shingles vaccine.  Your health care provider may also recommend other immunizations. This information is not intended to replace advice given to you by your health care provider. Make sure you discuss any questions you have with your health care provider. Document Released: 08/17/2005  Document Revised: 01/13/2016 Document Reviewed: 03/29/2015 Elsevier Interactive Patient Education  2018 [employer]  

## 2018-04-10 NOTE — Assessment & Plan Note (Signed)
salty foods, coke... Labs

## 2018-04-24 ENCOUNTER — Ambulatory Visit: Payer: Self-pay

## 2018-04-24 ENCOUNTER — Other Ambulatory Visit: Payer: Self-pay

## 2018-04-24 ENCOUNTER — Emergency Department (HOSPITAL_COMMUNITY)
Admission: EM | Admit: 2018-04-24 | Discharge: 2018-04-24 | Disposition: A | Discharge status: Home / Self Care | Payer: Medicare Other | Attending: Emergency Medicine | Admitting: Emergency Medicine

## 2018-04-24 ENCOUNTER — Encounter (HOSPITAL_COMMUNITY): Payer: Self-pay | Admitting: Emergency Medicine

## 2018-04-24 DIAGNOSIS — E860 Dehydration: Secondary | ICD-10-CM

## 2018-04-24 DIAGNOSIS — J45909 Unspecified asthma, uncomplicated: Secondary | ICD-10-CM | POA: Diagnosis not present

## 2018-04-24 DIAGNOSIS — Z79899 Other long term (current) drug therapy: Secondary | ICD-10-CM | POA: Diagnosis not present

## 2018-04-24 DIAGNOSIS — R002 Palpitations: Secondary | ICD-10-CM | POA: Diagnosis present

## 2018-04-24 LAB — BASIC METABOLIC PANEL
ANION GAP: 7 (ref 5–15)
BUN: 16 mg/dL (ref 8–23)
CHLORIDE: 104 mmol/L (ref 98–111)
CO2: 24 mmol/L (ref 22–32)
Calcium: 9.3 mg/dL (ref 8.9–10.3)
Creatinine, Ser: 1 mg/dL (ref 0.44–1.00)
GFR calc Af Amer: >60 mL/min (ref >60)
GFR, EST NON AFRICAN AMERICAN: 52 mL/min — AB (ref >60)
GLUCOSE: 153 mg/dL — AB (ref 70–99)
Potassium: 4 mmol/L (ref 3.5–5.1)
Sodium: 135 mmol/L (ref 135–145)

## 2018-04-24 LAB — CBC
HCT: 35.7 % — ABNORMAL LOW (ref 36.0–46.0)
Hemoglobin: 11.3 g/dL — ABNORMAL LOW (ref 12.0–15.0)
MCH: 31.4 pg (ref 26.0–34.0)
MCHC: 31.7 g/dL (ref 30.0–36.0)
MCV: 99.2 fL (ref 80.0–100.0)
Platelets: 304 10*3/uL (ref 150–400)
RBC: 3.6 MIL/uL — AB (ref 3.87–5.11)
RDW: 11.6 % (ref 11.5–15.5)
WBC: 5.6 10*3/uL (ref 4.0–10.5)
nRBC: 0 % (ref 0.0–0.2)

## 2018-04-24 LAB — URINALYSIS, ROUTINE W REFLEX MICROSCOPIC
Bacteria, UA: NONE SEEN
Bilirubin Urine: NEGATIVE
Glucose, UA: NEGATIVE mg/dL
Ketones, ur: NEGATIVE mg/dL
Leukocytes, UA: NEGATIVE
Nitrite: NEGATIVE
PROTEIN: NEGATIVE mg/dL
Specific Gravity, Urine: 1.006 (ref 1.005–1.030)
pH: 6 (ref 5.0–8.0)

## 2018-04-24 MED ORDER — SODIUM CHLORIDE 0.9 % IV BOLUS
1000.0000 mL | Freq: Once | INTRAVENOUS | Status: AC
  Administered 2018-04-24: 1000 mL via INTRAVENOUS

## 2018-04-24 NOTE — ED Provider Notes (Addendum)
Patient placed in Quick Look pathway, seen and evaluated   Chief Complaint:   HPI:   82 year old female presents with acute onset of lightheadedness/dizziness, feeling off balance, and palpitations since this afternoon. She called her PCP and was told to come here. She had cataract surgery yesterday by Dr. Gershon Crane. She denies chest pain, current SOB.  ROS: +lightheaded  Physical Exam:   Gen: No distress  Neuro: Awake and Alert  Skin: Warm    Focused Exam: Heart: Mild tachycardia, normal rhythm    Lungs: CTA   Initiation of care has begun. The patient has been counseled on the process, plan, and necessity for staying for the completion/evaluation, and the remainder of the medical screening examination    Recardo Evangelist, PA-C 04/24/18 1732    Recardo Evangelist, PA-C 04/24/18 Jennye Moccasin, MD 04/24/18 1734

## 2018-04-24 NOTE — ED Provider Notes (Signed)
[facility] EMERGENCY DEPARTMENT Provider Note   CSN: [id number] Arrival date & time: 04/24/18  1708     History   Chief Complaint Chief Complaint  Patient presents with  . Dizziness  . Palpitations    HPI Cindy Meza is a 82 y.o. female.  Pt presents to the ED today with dizziness and palpitations.  The pt had her left cataract done yesterday and was npo from midnight to around 0900 yesterday.  The pt had no trouble eating and drinking yesterday.  This afternoon, she started feeling some heart palpitations and felt a little dizzy.  Palpitations are gone now, but she felt a little light headed walking back to her room.  Dizziness is worse with standing.  No dizziness with head movement.  The pt denies cp or sob.     Past Medical History:  Diagnosis Date  . Allergy   . Anemia   . Asthma    Dr. Melvyn Novas  . Blood transfusion without reported diagnosis   . Breast cyst    Left  . Cataract    Dr. Gershon Crane  . Complication of anesthesia    ' I HAD HALLUCINATIONS WHEN THEY DID MY HIP "  . GERD (gastroesophageal reflux disease)   . Heart murmur   . Hemorrhoids   . History of colonic polyps [name]   hx of adenomas (small) 1998  . OA (osteoarthritis)   . Osteopenia   . PAC (premature atrial contraction)   . Vitamin D deficiency     Patient Active Problem List   Diagnosis Date Noted  . Fall 06/12/2016  . Closed right hip fracture ([place]) 06/12/2016  . Cerumen impaction 04/06/2016  . Fatigue 02/15/2016  . DOE (dyspnea on exertion) 02/15/2016  . Headache 09/27/2015  . Coronary atherosclerosis 06/29/2015  . Urgency of urination 05/17/2015  . Paresthesia of arm 09/21/2014  . Breast pain, left 09/21/2014  . Chest pressure 07/22/2012  . Lightheadedness 07/17/2012  . Hypotension 07/17/2012  . Hot flash, menopausal 02/15/2012  . Allergic rhinitis 09/12/2011  . Well adult exam 02/14/2011  . HEMORRHOIDS, WITH BLEEDING 10/05/2009  . Vitamin D deficiency  06/17/2007  . Asthma 06/17/2007  . GERD (gastroesophageal reflux disease) 06/17/2007  . OSTEOARTHRITIS 06/17/2007  . Osteoporosis 06/17/2007  . COLONIC POLYPS, HX OF 06/17/2007    Past Surgical History:  Procedure Laterality Date  . ABDOMINAL HYSTERECTOMY     complete  . APPENDECTOMY    . BREAST CYST EXCISION    . CATARACT EXTRACTION, BILATERAL     04/23/18 left eye, 04/02/18 right eye  . COLONOSCOPY  Multiple   Adenomatous colon polyps  . ESOPHAGOGASTRODUODENOSCOPY  2010  . INTRAMEDULLARY (IM) NAIL INTERTROCHANTERIC Right 06/13/2016   Procedure: INTRAMEDULLARY (IM) NAIL INTERTROCHANTRIC;  Surgeon: Tania Ade, MD;  Location: [facility];  Service: Orthopedics;  Laterality: Right;  . TONSILLECTOMY       OB History   None      Home Medications    Prior to Admission medications   Medication Sig Start Date End Date Taking? Authorizing Provider  albuterol (PROVENTIL HFA;VENTOLIN HFA) 108 (90 Base) MCG/ACT inhaler Inhale 1-2 puffs into the lungs every 6 (six) hours.    [provider]  aspirin 81 MG tablet Take 81 mg by mouth daily.    [provider]  Calcium-Magnesium-Vitamin D (CALCIUM 500 PO) Take 1 each by mouth daily.    [provider]  Cholecalciferol 1000 UNITS tablet Take 1,000 Units by mouth daily.  [provider]  Difluprednate (DUREZOL OP) Apply to eye.    [provider]  fluticasone-salmeterol (ADVAIR HFA) 115-21 MCG/ACT inhaler Inhale 2 puffs into the lungs 2 (two) times daily. 04/09/17   [name], Evie Lacks, MD  loratadine (CLARITIN) 10 MG tablet Take 10 mg by mouth daily.     [provider]  MEGARED OMEGA-3 KRILL OIL 500 MG CAPS Take 1 capsule by mouth every morning. 06/29/15   [name], Evie Lacks, MD  ofloxacin (OCUFLOX) 0.3 % ophthalmic solution 1 drop 2 (two) times daily.    [provider]  omeprazole (PRILOSEC) 40 MG capsule Take 1 capsule (40 mg total) daily by mouth. 05/17/17   Gatha Mayer, MD    Family History Family History  Problem Relation Age of Onset  . Diabetes Mother 12       Deceased  . Hypertension Other   . Colon cancer Neg Hx   . Pancreatic cancer Neg Hx   . Stomach cancer Neg Hx   . Esophageal cancer Neg Hx   . Rectal cancer Neg Hx     Social History Social History   Tobacco Use  . Smoking status: Never Smoker  . Smokeless tobacco: Never Used  Substance Use Topics  . Alcohol use: Yes    Alcohol/week: 0.0 standard drinks    Comment: 3-4 times per week - vodka 1 drink  . Drug use: No     Allergies   Fosamax [alendronate sodium]; Gabapentin; Lidocaine; Risedronate sodium; Shellfish allergy; and Statins   Review of Systems Review of Systems  Cardiovascular: Positive for palpitations.  Neurological: Positive for weakness.  All other systems reviewed and are negative.    Physical Exam Updated Vital Signs BP (!) 163/78   Pulse 74   Temp 97.9 F (36.6 C) (Oral)   Resp 15   Ht 5\' 2"  (1.575 m)   Wt 53.5 kg   SpO2 99%   BMI 21.58 kg/m   Physical Exam  Constitutional: She is oriented to person, place, and time. She appears well-developed and well-nourished.  HENT:  Head: Normocephalic and atraumatic.  Right Ear: External ear normal.  Left Ear: External ear normal.  Nose: Nose normal.  Mouth/Throat: Oropharynx is clear and moist.  Eyes: Pupils are equal, round, and reactive to light. Conjunctivae and EOM are normal.  Neck: Normal range of motion. Neck supple.  Cardiovascular: Normal rate, regular rhythm, normal heart sounds and intact distal pulses.  Pulmonary/Chest: Effort normal and breath sounds normal.  Abdominal: Soft. Bowel sounds are normal.  Musculoskeletal: Normal range of motion.  Neurological: She is alert and oriented to person, place, and time.  Skin: Skin is warm. Capillary refill takes less than 2 seconds.  Psychiatric: She has a normal mood and affect. Her behavior is normal. Judgment and thought content  normal.  Nursing note and vitals reviewed.    ED Treatments / Results  Labs (all labs ordered are listed, but only abnormal results are displayed) Labs Reviewed  BASIC METABOLIC PANEL - Abnormal; Notable for the following components:      Result Value   Glucose, Bld 153 (*)    GFR calc non Af Amer 52 (*)    All other components within normal limits  CBC - Abnormal; Notable for the following components:   RBC 3.60 (*)    Hemoglobin 11.3 (*)    HCT 35.7 (*)    All other components within normal limits  URINALYSIS, ROUTINE W REFLEX MICROSCOPIC - Abnormal; Notable  for the following components:   Color, Urine STRAW (*)    Hgb urine dipstick SMALL (*)    All other components within normal limits  CBG MONITORING, ED    EKG EKG Interpretation  Date/Time:  Thursday April 24 2018 17:27:20 EDT Ventricular Rate:  87 PR Interval:  118 QRS Duration: 72 QT Interval:  344 QTC Calculation: 413 R Axis:   47 Text Interpretation:  Normal sinus rhythm with sinus arrhythmia Nonspecific ST and T wave abnormality Abnormal ECG No significant change since last tracing Confirmed by Isla Pence [id number]) on 04/24/2018 7:55:11 PM   Radiology No results found.  Procedures Procedures (including critical care time)  Medications Ordered in ED Medications  sodium chloride 0.9 % bolus 1,000 mL (1,000 mLs Intravenous New Bag/Given 04/24/18 2039)     Initial Impression / Assessment and Plan / ED Course  I have reviewed the triage vital signs and the nursing notes.  Pertinent labs & imaging results that were available during my care of the patient were reviewed by me and considered in my medical decision making (see chart for details).    Pt is feeling much better after 1L NS.  The pt was able to walk to the bathroom without any difficulty.  As sx have improved after fluids, I don't think any CT scans/CXR are indicated.  Final Clinical Impressions(s) / ED Diagnoses   Final diagnoses:    Palpitations  Dehydration    ED Discharge Orders    None       Isla Pence, MD 04/24/18 2211

## 2018-04-24 NOTE — ED Triage Notes (Signed)
Pt reports lightheadedness and palpitations that started around 3pm this afternoon. Pt reports she feels like her balance is off. No neuro deficits noted. Denies pain.

## 2018-04-24 NOTE — Telephone Encounter (Signed)
Received call from patient who states she has been dizzy today. It started about noon She took her BP at 1230 91/57 hr85. She retook her BP at 1530 and it was 117/71 HR 101. Pt states she feel like her heart is fluttering and she feel nervous inside. She stated that she had cateract surgery yesterday and was started on two gtts Durezol and Ofloxacin. No other new medications. Pt is eating and drinking. Pt states Dr Alain Marion told her she had an irregular heart rate. Pt denies chest pain.  Pt is able to walk without help.Per protocol pt will be seen in ED. Pt states she has someone to drive her. Care advice read to patient. Pt verbalized understanding of all instructions.  Reason for Disposition . Extra heart beats OR irregular heart beating  (i.e., "palpitations")  Answer Assessment - Initial Assessment Questions 1. DESCRIPTION: "Describe your dizziness."     Light headed 2. LIGHTHEADED: "Do you feel lightheaded?" (e.g., somewhat faint, woozy, weak upon standing)     woozy 3. VERTIGO: "Do you feel like either you or the room is spinning or tilting?" (i.e. vertigo)     no 4. SEVERITY: "How bad is it?"  "Do you feel like you are going to faint?" "Can you stand and walk?"   - MILD - walking normally   - MODERATE - interferes with normal activities (e.g., work, school)    - SEVERE - unable to stand, requires support to walk, feels like passing out now.      mild 5. ONSET:  "When did the dizziness begin?"     This afternoon 6. AGGRAVATING FACTORS: "Does anything make it worse?" (e.g., standing, change in head position)     standing 7. HEART RATE: "Can you tell me your heart rate?" "How many beats in 15 seconds?"  (Note: not all patients can do this)      85 at 1230  1530 hr 101 8. CAUSE: "What do you think is causing the dizziness?"     Heart rate  I was told I have irregular heart beat 9. RECURRENT SYMPTOM: "Have you had dizziness before?" If so, ask: "When was the last time?" "What happened  that time?"     no 10. OTHER SYMPTOMS: "Do you have any other symptoms?" (e.g., fever, chest pain, vomiting, diarrhea, bleeding)       no 11. PREGNANCY: "Is there any chance you are pregnant?" "When was your last menstrual period?"       N/A  Protocols used: DIZZINESS [facility]

## 2018-04-24 NOTE — ED Notes (Signed)
Pt reports no new onset dizziness with movement or standing. Pt reports feeling off balance as she stands for longer periods of time but no dizziness.

## 2018-04-24 NOTE — ED Notes (Signed)
Pt ambulated to the bathroom. Steady gait, no assistance required, no difficulty noted.

## 2018-04-24 NOTE — ED Notes (Signed)
Patient verbalizes understanding of discharge instructions. Opportunity for questioning and answers were provided. Armband removed by staff, pt discharged from ED in wheelchair with family to lobby.

## 2018-04-25 NOTE — Telephone Encounter (Signed)
Pt was seen in the ER on 04/24/2018.

## 2018-04-28 ENCOUNTER — Encounter: Payer: Self-pay | Admitting: Family

## 2018-04-28 ENCOUNTER — Ambulatory Visit (INDEPENDENT_AMBULATORY_CARE_PROVIDER_SITE_OTHER): Payer: Medicare Other | Admitting: Family

## 2018-04-28 ENCOUNTER — Ambulatory Visit: Payer: Self-pay | Admitting: *Deleted

## 2018-04-28 VITALS — BP 116/72 | HR 86 | Temp 98.0°F | Ht 62.0 in | Wt 119.0 lb

## 2018-04-28 DIAGNOSIS — I951 Orthostatic hypotension: Secondary | ICD-10-CM | POA: Diagnosis not present

## 2018-04-28 NOTE — Telephone Encounter (Signed)
Pt triaged and seen in ED Thursday 04/24/18 for similar symptoms. Per ED report: "Palpitations, dehydration."  Pt calling today to report "Still lightheaded, weak." States symptoms no worse, positional. States lightheadedness occurs when going from sitting to standing. BP during call 152/91 (sitting.) HR 82.  States BP has been fluctuating yesterday 103/65  HR 90.  States has been staying hydrated as directed in ED. Denies any palpitations. Same day appt made with Mindi Slicker;   [place] called practice, spoke with Sam to verify appropriate scheduling. Also made ER F/U appt with Dr. Alain Marion for 05/07/18. Care advise given per protocol.    Reason for Disposition . [1] MODERATE dizziness (e.g., interferes with normal activities) AND [2] has NOT been evaluated by physician for this  (Exception: dizziness caused by heat exposure, sudden standing, or poor fluid intake)  Answer Assessment - Initial Assessment Questions 1. DESCRIPTION: "Describe your dizziness."    Lightheadedness 2. LIGHTHEADED: "Do you feel lightheaded?" (e.g., somewhat faint, woozy, weak upon standing)     Yes, positional 3. VERTIGO: "Do you feel like either you or the room is spinning or tilting?" (i.e. vertigo)     no 4. SEVERITY: "How bad is it?"  "Do you feel like you are going to faint?" "Can you stand and walk?"   - MILD - walking normally   - MODERATE - interferes with normal activities (e.g., work, school)    - SEVERE - unable to stand, requires support to walk, feels like passing out now.      Moderate 5. ONSET:  "When did the dizziness begin?"     LAst week, seen in ED 04/24/18 6. AGGRAVATING FACTORS: "Does anything make it worse?" (e.g., standing, change in head position)     Only occurs when going from sitting to standing 7. HEART RATE: "Can you tell me your heart rate?" "How many beats in 15 seconds?"  (Note: not all patients can do this)       82..blood pressure during call 152/91 8. CAUSE: "What do you think is causing  the dizziness?"     Unsure, seen in ED Thursday, palpitations, dehydration. 9. RECURRENT SYMPTOM: "Have you had dizziness before?" If so, ask: "When was the last time?" "What happened that time?"     Yes, "But not for this long." 10. OTHER SYMPTOMS: "Do you have any other symptoms?" (e.g., fever, chest pain, vomiting, diarrhea, bleeding)       Weakness, shaky at times.  Protocols used: DIZZINESS [facility]

## 2018-04-28 NOTE — Progress Notes (Signed)
Cindy Meza is a 82 y.o. female with the following history as recorded in Epic[employer]:  Patient Active Problem List   Diagnosis Date Noted  . Fall 06/12/2016  . Closed right hip fracture ([place]) 06/12/2016  . Cerumen impaction 04/06/2016  . Fatigue 02/15/2016  . DOE (dyspnea on exertion) 02/15/2016  . Headache 09/27/2015  . Coronary atherosclerosis 06/29/2015  . Urgency of urination 05/17/2015  . Paresthesia of arm 09/21/2014  . Breast pain, left 09/21/2014  . Chest pressure 07/22/2012  . Lightheadedness 07/17/2012  . Hypotension 07/17/2012  . Hot flash, menopausal 02/15/2012  . Allergic rhinitis 09/12/2011  . Well adult exam 02/14/2011  . HEMORRHOIDS, WITH BLEEDING 10/05/2009  . Vitamin D deficiency 06/17/2007  . Asthma 06/17/2007  . GERD (gastroesophageal reflux disease) 06/17/2007  . OSTEOARTHRITIS 06/17/2007  . Osteoporosis 06/17/2007  . COLONIC POLYPS, HX OF 06/17/2007    Current Outpatient Medications  Medication Sig Dispense Refill  . albuterol (PROVENTIL HFA;VENTOLIN HFA) 108 (90 Base) MCG/ACT inhaler Inhale 1-2 puffs into the lungs every 6 (six) hours.    Marland Kitchen aspirin 81 MG tablet Take 81 mg by mouth daily.    . Calcium-Magnesium-Vitamin D (CALCIUM 500 PO) Take 1 each by mouth daily.    . Cholecalciferol 1000 UNITS tablet Take 1,000 Units by mouth daily.      . Difluprednate (DUREZOL OP) Apply to eye.    . fluticasone-salmeterol (ADVAIR HFA) 115-21 MCG/ACT inhaler Inhale 2 puffs into the lungs 2 (two) times daily. 1 Inhaler 11  . loratadine (CLARITIN) 10 MG tablet Take 10 mg by mouth daily.     Marland Kitchen MEGARED OMEGA-3 KRILL OIL 500 MG CAPS Take 1 capsule by mouth every morning. 100 capsule 3  . ofloxacin (OCUFLOX) 0.3 % ophthalmic solution 1 drop 2 (two) times daily.    Marland Kitchen omeprazole (PRILOSEC) 40 MG capsule Take 1 capsule (40 mg total) daily by mouth. 90 capsule 3   No current facility-administered medications for this visit.     Allergies: Fosamax [alendronate sodium];  Gabapentin; Lidocaine; Risedronate sodium; Shellfish allergy; and Statins  Past Medical History:  Diagnosis Date  . Allergy   . Anemia   . Asthma    Dr. Melvyn Novas  . Blood transfusion without reported diagnosis   . Breast cyst    Left  . Cataract    Dr. Gershon Crane  . Complication of anesthesia    ' I HAD HALLUCINATIONS WHEN THEY DID MY HIP "  . GERD (gastroesophageal reflux disease)   . Heart murmur   . Hemorrhoids   . History of colonic polyps [name]   hx of adenomas (small) 1998  . OA (osteoarthritis)   . Osteopenia   . PAC (premature atrial contraction)   . Vitamin D deficiency     Past Surgical History:  Procedure Laterality Date  . ABDOMINAL HYSTERECTOMY     complete  . APPENDECTOMY    . BREAST CYST EXCISION    . CATARACT EXTRACTION, BILATERAL     04/23/18 left eye, 04/02/18 right eye  . COLONOSCOPY  Multiple   Adenomatous colon polyps  . ESOPHAGOGASTRODUODENOSCOPY  2010  . INTRAMEDULLARY (IM) NAIL INTERTROCHANTERIC Right 06/13/2016   Procedure: INTRAMEDULLARY (IM) NAIL INTERTROCHANTRIC;  Surgeon: Tania Ade, MD;  Location: [facility];  Service: Orthopedics;  Laterality: Right;  . TONSILLECTOMY      Family History  Problem Relation Age of Onset  . Diabetes Mother 68       Deceased  . Hypertension Other   . Colon cancer  Neg Hx   . Pancreatic cancer Neg Hx   . Stomach cancer Neg Hx   . Esophageal cancer Neg Hx   . Rectal cancer Neg Hx     Social History   Tobacco Use  . Smoking status: Never Smoker  . Smokeless tobacco: Never Used  Substance Use Topics  . Alcohol use: Yes    Alcohol/week: 0.0 standard drinks    Comment: 3-4 times per week - vodka 1 drink    Subjective:  Presents with her husband today; has been experiencing symptoms of feeling "woozy"/ "off balance" when changing positions for the past 2-3 months; discussed with her PCP at recent CPE- labs were normal/ told to try eating more salt; cortisol was normal; went to ER last Thursday with worsening  dizziness/ senation of palpitations- found to be dehydrated; was given fluids and did feel some better; had EKG, labs done at ER; notes that the sensation of feeling woozy/ fluctuations in her blood pressure is becoming more frequent and more severe.      Objective:  Vitals:   04/28/18 1516  BP: 116/72  Pulse: 86  Temp: 98 F (36.7 C)  TempSrc: Oral  SpO2: 99%  Weight: 119 lb (54 kg)  Height: 5\' 2"  (1.575 m)    General: Well developed, well nourished, in no acute distress; blood pressure when standing at 110/ 70; lying down 130/80;  Skin : Warm and dry.  Head: Normocephalic and atraumatic  Eyes: Sclera and conjunctiva clear; pupils round and reactive to light; extraocular movements intact  Ears: External normal; canals clear; tympanic membranes normal  Oropharynx: Pink, supple. No suspicious lesions  Neck: Supple without thyromegaly, adenopathy  Lungs: Respirations unlabored; clear to auscultation bilaterally without wheeze, rales, rhonchi  CVS exam: normal rate and regular rhythm.  Vessels: Symmetric bilaterally  Neurologic: Alert and oriented; speech intact; face symmetrical; moves all extremities well; CNII-XII intact without focal deficit   Assessment:  1. Orthostatic hypotension     Plan:  Reviewed recent labs done with PCP and ER; do not feel further labs need to be done today; will refer to cardiology for evaluation; if this work up is normal, may need to see neurology. Follow-up to be determined; okay to continue Gatorade and V-8 as tolerated;   No follow-ups on file.  Orders Placed This Encounter  Procedures  . Ambulatory referral to Cardiology    Referral Priority:   Urgent    Referral Type:   Consultation    Referral Reason:   Specialty Services Required    Requested Specialty:   Cardiology    Number of Visits Requested:   1    Requested Prescriptions    No prescriptions requested or ordered in this encounter

## 2018-05-07 ENCOUNTER — Inpatient Hospital Stay: Payer: Medicare Other | Admitting: Internal Medicine

## 2018-05-11 ENCOUNTER — Other Ambulatory Visit: Payer: Self-pay | Admitting: Internal Medicine

## 2018-05-13 ENCOUNTER — Emergency Department (HOSPITAL_COMMUNITY)
Admission: EM | Admit: 2018-05-13 | Discharge: 2018-05-14 | Disposition: A | Discharge status: Home / Self Care | Payer: Medicare Other | Attending: Emergency Medicine | Admitting: Emergency Medicine

## 2018-05-13 ENCOUNTER — Other Ambulatory Visit: Payer: Self-pay

## 2018-05-13 ENCOUNTER — Encounter (HOSPITAL_COMMUNITY): Payer: Self-pay | Admitting: Emergency Medicine

## 2018-05-13 ENCOUNTER — Emergency Department (HOSPITAL_COMMUNITY): Payer: Medicare Other

## 2018-05-13 DIAGNOSIS — Z79899 Other long term (current) drug therapy: Secondary | ICD-10-CM | POA: Diagnosis not present

## 2018-05-13 DIAGNOSIS — R42 Dizziness and giddiness: Secondary | ICD-10-CM | POA: Diagnosis present

## 2018-05-13 DIAGNOSIS — Z7982 Long term (current) use of aspirin: Secondary | ICD-10-CM | POA: Insufficient documentation

## 2018-05-13 DIAGNOSIS — I951 Orthostatic hypotension: Secondary | ICD-10-CM

## 2018-05-13 DIAGNOSIS — R202 Paresthesia of skin: Secondary | ICD-10-CM | POA: Diagnosis not present

## 2018-05-13 DIAGNOSIS — R079 Chest pain, unspecified: Secondary | ICD-10-CM | POA: Insufficient documentation

## 2018-05-13 DIAGNOSIS — J45909 Unspecified asthma, uncomplicated: Secondary | ICD-10-CM | POA: Diagnosis not present

## 2018-05-13 DIAGNOSIS — R0789 Other chest pain: Secondary | ICD-10-CM

## 2018-05-13 LAB — CBC
HEMATOCRIT: 37.5 % (ref 36.0–46.0)
Hemoglobin: 11.8 g/dL — ABNORMAL LOW (ref 12.0–15.0)
MCH: 30.7 pg (ref 26.0–34.0)
MCHC: 31.5 g/dL (ref 30.0–36.0)
MCV: 97.7 fL (ref 80.0–100.0)
NRBC: 0 % (ref 0.0–0.2)
PLATELETS: 295 10*3/uL (ref 150–400)
RBC: 3.84 MIL/uL — ABNORMAL LOW (ref 3.87–5.11)
RDW: 11.4 % — AB (ref 11.5–15.5)
WBC: 5.9 10*3/uL (ref 4.0–10.5)

## 2018-05-13 LAB — I-STAT TROPONIN, ED: TROPONIN I, POC: 0 ng/mL (ref 0.00–0.08)

## 2018-05-13 LAB — BASIC METABOLIC PANEL
ANION GAP: 9 (ref 5–15)
BUN: 12 mg/dL (ref 8–23)
CALCIUM: 9.5 mg/dL (ref 8.9–10.3)
CO2: 25 mmol/L (ref 22–32)
Chloride: 101 mmol/L (ref 98–111)
Creatinine, Ser: 0.9 mg/dL (ref 0.44–1.00)
GFR calc Af Amer: >60 mL/min (ref >60)
GFR, EST NON AFRICAN AMERICAN: 59 mL/min — AB (ref >60)
Glucose, Bld: 135 mg/dL — ABNORMAL HIGH (ref 70–99)
POTASSIUM: 3.6 mmol/L (ref 3.5–5.1)
SODIUM: 135 mmol/L (ref 135–145)

## 2018-05-13 NOTE — ED Provider Notes (Signed)
[facility] EMERGENCY DEPARTMENT Provider Note  CSN: [id number] Arrival date & time: 05/13/18 1710  Chief Complaint(s) Dizziness; Chest Pain; and Tingling  HPI Cindy Meza is a 82 y.o. female   HPI  Chest tightness. Mild to moderate in intensity. Recurrent for several weeks. Occurs intermittently and sporadically, usually in the mornings. Nonexertional. Nonradiating, but has left arm tingling. At times has SOB. No nausea or emesis. No HTN, HLD, or DM. Non smoker. No prior MI. Stress test negative 3 yrs ago.  Left arm tingling; intermittent for several days. Not always associated with chest tightness.  Lightheadedness usually in the mornings. With standing.   Past Medical History Past Medical History:  Diagnosis Date  . Allergy   . Anemia   . Asthma    Dr. Melvyn Novas  . Blood transfusion without reported diagnosis   . Breast cyst    Left  . Cataract    Dr. Gershon Crane  . Complication of anesthesia    ' I HAD HALLUCINATIONS WHEN THEY DID MY HIP "  . GERD (gastroesophageal reflux disease)   . Heart murmur   . Hemorrhoids   . History of colonic polyps [name]   hx of adenomas (small) 1998  . OA (osteoarthritis)   . Osteopenia   . PAC (premature atrial contraction)   . Vitamin D deficiency    Patient Active Problem List   Diagnosis Date Noted  . Fall 06/12/2016  . Closed right hip fracture ([place]) 06/12/2016  . Cerumen impaction 04/06/2016  . Fatigue 02/15/2016  . DOE (dyspnea on exertion) 02/15/2016  . Headache 09/27/2015  . Coronary atherosclerosis 06/29/2015  . Urgency of urination 05/17/2015  . Paresthesia of arm 09/21/2014  . Breast pain, left 09/21/2014  . Chest pressure 07/22/2012  . Lightheadedness 07/17/2012  . Hypotension 07/17/2012  . Hot flash, menopausal 02/15/2012  . Allergic rhinitis 09/12/2011  . Well adult exam 02/14/2011  . HEMORRHOIDS, WITH BLEEDING 10/05/2009  . Vitamin D deficiency 06/17/2007  . Asthma 06/17/2007  . GERD  (gastroesophageal reflux disease) 06/17/2007  . OSTEOARTHRITIS 06/17/2007  . Osteoporosis 06/17/2007  . COLONIC POLYPS, HX OF 06/17/2007   Home Medication(s) Prior to Admission medications   Medication Sig Start Date End Date Taking? Authorizing Provider  albuterol (PROVENTIL HFA;VENTOLIN HFA) 108 (90 Base) MCG/ACT inhaler Inhale 1-2 puffs into the lungs every 6 (six) hours.   Yes [provider]  aspirin 81 MG tablet Take 81 mg by mouth daily.   Yes [provider]  Calcium-Magnesium-Vitamin D (CALCIUM 500 PO) Take 1 each by mouth daily.   Yes [provider]  Cholecalciferol 1000 UNITS tablet Take 1,000 Units by mouth daily.     Yes [provider]  fluticasone-salmeterol (ADVAIR HFA) 115-21 MCG/ACT inhaler Inhale 2 puffs into the lungs 2 (two) times daily. 04/09/17  Yes [name], Evie Lacks, MD  loratadine (CLARITIN) 10 MG tablet Take 10 mg by mouth daily.    Yes [provider]  MEGARED OMEGA-3 KRILL OIL 500 MG CAPS Take 1 capsule by mouth every morning. Patient taking differently: Take 1 capsule by mouth daily.  06/29/15  Yes [name], Evie Lacks, MD  omeprazole (PRILOSEC) 40 MG capsule TAKE 1 CAPSULE(40 MG) BY MOUTH DAILY Patient taking differently: Take 40 mg by mouth daily.  05/12/18  Yes Gatha Mayer, MD  Past Surgical History Past Surgical History:  Procedure Laterality Date  . ABDOMINAL HYSTERECTOMY     complete  . APPENDECTOMY    . BREAST CYST EXCISION    . CATARACT EXTRACTION, BILATERAL     04/23/18 left eye, 04/02/18 right eye  . COLONOSCOPY  Multiple   Adenomatous colon polyps  . ESOPHAGOGASTRODUODENOSCOPY  2010  . INTRAMEDULLARY (IM) NAIL INTERTROCHANTERIC Right 06/13/2016   Procedure: INTRAMEDULLARY (IM) NAIL INTERTROCHANTRIC;  Surgeon: Tania Ade, MD;  Location: [place];  Service: Orthopedics;   Laterality: Right;  . TONSILLECTOMY     Family History Family History  Problem Relation Age of Onset  . Diabetes Mother 58       Deceased  . Hypertension Other   . Colon cancer Neg Hx   . Pancreatic cancer Neg Hx   . Stomach cancer Neg Hx   . Esophageal cancer Neg Hx   . Rectal cancer Neg Hx     Social History Social History   Tobacco Use  . Smoking status: Never Smoker  . Smokeless tobacco: Never Used  Substance Use Topics  . Alcohol use: Yes    Alcohol/week: 0.0 standard drinks    Comment: 3-4 times per week - vodka 1 drink  . Drug use: No   Allergies Fosamax [alendronate sodium]; Gabapentin; Lidocaine; Risedronate sodium; Shellfish allergy; and Statins  Review of Systems Review of Systems All other systems are reviewed and are negative for acute change except as noted in the HPI  Physical Exam Vital Signs  I have reviewed the triage vital signs BP (!) 173/87   Pulse 72   Temp 97.6 F (36.4 C) (Oral)   Resp 16   Ht 5\' 2"  (1.575 m)   Wt 54 kg   SpO2 99%   BMI 21.77 kg/m   Physical Exam  Constitutional: She is oriented to person, place, and time. She appears well-developed and well-nourished. No distress.  HENT:  Head: Normocephalic and atraumatic.  Nose: Nose normal.  Eyes: Pupils are equal, round, and reactive to light. Conjunctivae and EOM are normal. Right eye exhibits no discharge. Left eye exhibits no discharge. No scleral icterus.  Neck: Normal range of motion. Neck supple.  Cardiovascular: Normal rate. A regularly irregular rhythm present. Exam reveals no gallop and no friction rub.  No murmur heard. Pulmonary/Chest: Effort normal and breath sounds normal. No stridor. No respiratory distress. She has no rales.  Abdominal: Soft. She exhibits no distension. There is no tenderness.  Musculoskeletal: She exhibits no edema or tenderness.  Neurological: She is alert and oriented to person, place, and time.  5/5 strength throughout  Skin: Skin is warm  and dry. No rash noted. She is not diaphoretic. No erythema.  Psychiatric: She has a normal mood and affect.  Vitals reviewed.   ED Results and Treatments Labs (all labs ordered are listed, but only abnormal results are displayed) Labs Reviewed  BASIC METABOLIC PANEL - Abnormal; Notable for the following components:      Result Value   Glucose, Bld 135 (*)    GFR calc non Af Amer 59 (*)    All other components within normal limits  CBC - Abnormal; Notable for the following components:   RBC 3.84 (*)    Hemoglobin 11.8 (*)    RDW 11.4 (*)    All other components within normal limits  I-STAT TROPONIN, ED  I-STAT TROPONIN, ED  EKG  EKG Interpretation  Date/Time:  Tuesday May 13 2018 17:16:57 EST Ventricular Rate:  92 PR Interval:  136 QRS Duration: 72 QT Interval:  348 QTC Calculation: 430 R Axis:   70 Text Interpretation:  Sinus rhythm with Premature supraventricular complexes Nonspecific ST and T wave abnormality Abnormal ECG Otherwise no significant change Confirmed by Addison Lank [phone]) on 05/13/2018 11:04:47 PM      Radiology Dg Chest 2 View  Result Date: 05/13/2018 CLINICAL DATA:  Midsternal chest pain since Saturday. EXAM: CHEST - 2 VIEW COMPARISON:  June 12, 2016 FINDINGS: The heart size and mediastinal contours are within normal limits. There is no focal infiltrate, pulmonary edema, or pleural effusion. The visualized skeletal structures are unremarkable. IMPRESSION: No active cardiopulmonary disease. Electronically Signed   By: Abelardo Diesel M.D.   On: 05/13/2018 18:16   Pertinent labs & imaging results that were available during my care of the patient were reviewed by me and considered in my medical decision making (see chart for details).  Medications Ordered in ED Medications - No data to display                                                                                                                                   Procedures Procedures  (including critical care time)  Medical Decision Making / ED Course I have reviewed the nursing notes for this encounter and the patient's prior records (if available in EHR or on provided paperwork).      Record review w/o Ao stenosis. EF 55%.  Patient presents to the emergency department with numerous complaints including intermittent chest discomfort, left arm tingling and lightheadedness.  This appeared to be related to orthostasis as it typically occurs in the morning and when she is standing.  Currently asymptomatic.  EKG without acute ischemic changes or evidence of pericarditis.  Initial troponin negative.  Doubt ACS but given her age, a delta troponin was obtained and negative.  Doubt pulmonary embolism.  Not classic for aortic dissection or esophageal perforation.  Chest x-ray without evidence suggestive of pneumonia, pneumothorax, pneumomediastinum.  No abnormal contour of the mediastinum to suggest dissection. No evidence of acute injuries.  Exam nonfocal. Doubt CVA/TIA.  Orthostatics were positive going from the 160s to the 130s, the patient was asymptomatic at that time.  No need for IV hydration at this time.  Recommended continued oral intake.  She reports that she has a cardiology appointment later this week.  The patient appears reasonably screened and/or stabilized for discharge and I doubt any other medical condition or other [facility] requiring further screening, evaluation, or treatment in the ED at this time prior to discharge.  The patient is safe for discharge with strict return precautions.    Final Clinical Impression(s) / ED Diagnoses Final diagnoses:  Orthostasis  Feeling of chest tightness  Paresthesia    All other systems are reviewed and are negative for acute  change except as noted in the HPI   This chart was dictated using voice  recognition software.  Despite best efforts to proofread,  errors can occur which can change the documentation meaning.   Fatima Blank, MD 05/14/18 [phone]

## 2018-05-13 NOTE — ED Triage Notes (Signed)
Pt presents with CP, tingling to L arm, some dizziness since Sunday; pt also reporting sob and fatigue

## 2018-05-14 LAB — I-STAT TROPONIN, ED: Troponin i, poc: 0 ng/mL (ref 0.00–0.08)

## 2018-05-14 NOTE — ED Notes (Signed)
Patient verbalizes understanding of discharge instructions. Opportunity for questioning and answers were provided. Armband removed by staff, pt discharged from ED in wheelchair.  

## 2018-05-14 NOTE — ED Notes (Signed)
ED Provider at bedside. 

## 2018-05-14 NOTE — ED Notes (Signed)
Patient ambulated to the bathroom with no complaints.

## 2018-05-15 ENCOUNTER — Telehealth: Payer: Self-pay

## 2018-05-15 ENCOUNTER — Encounter: Payer: Self-pay | Admitting: Cardiology

## 2018-05-15 ENCOUNTER — Ambulatory Visit (INDEPENDENT_AMBULATORY_CARE_PROVIDER_SITE_OTHER): Payer: Medicare Other | Admitting: Cardiology

## 2018-05-15 VITALS — BP 114/78 | HR 85 | Ht 62.0 in | Wt 118.8 lb

## 2018-05-15 DIAGNOSIS — I340 Nonrheumatic mitral (valve) insufficiency: Secondary | ICD-10-CM | POA: Diagnosis not present

## 2018-05-15 DIAGNOSIS — I951 Orthostatic hypotension: Secondary | ICD-10-CM | POA: Diagnosis not present

## 2018-05-15 DIAGNOSIS — I351 Nonrheumatic aortic (valve) insufficiency: Secondary | ICD-10-CM | POA: Diagnosis not present

## 2018-05-15 DIAGNOSIS — R0789 Other chest pain: Secondary | ICD-10-CM

## 2018-05-15 NOTE — Telephone Encounter (Signed)
Per Pecolia Ades, NP to schedule pt for echo. Orders have been put in

## 2018-05-15 NOTE — Progress Notes (Signed)
Cardiology Office Note:    Date:  05/15/2018   ID:  [name], DOB August 18, 1935, MRN [id number]  PCP:  Cassandria Anger, MD  Cardiologist:  Dorris Carnes, MD  Referring MD: [name],*   Chief Complaint  Patient presents with  . Dizziness    History of Present Illness:    Cindy Meza is a 82 y.o. female with a past medical history significant for arthritis, GERD, asthma.  Cindy Meza has been seen one time in our office by Dr. Harrington Challenger in 04/2016 with complaints of fatigue and shortness of breath with exertion.  She underwent a normal stress Myoview on 04/25/2016 and an echocardiogram on 04/20/2016 showing EF 34-74%, grade 1 diastolic dysfunction, mild AR and moderate MR.  She was seen in the ED on 04/24/18 with dehydration. She follow up with her PCP and was told to eat salty foods.   The patient presented to the ED on 05/13/2018 with complaints of chest tightness, recurrent for several weeks, nonexertional and nonradiating.  She did have tingling of her left arm not always associated with chest tightness.  She complained of lightheadedness usually in the mornings and with standing.  EKG showed no ischemic changes and troponin was negative. Orthostatics were positive going from the 160s to the 130s.  She was recommended to increase oral intake.  Cindy. Meza is here today for ED follow up with her husband. She feels occ mild chest tightness lasting a few minutes with associated shortness of breath. Also has intermittent left arm numbness and tingling lasting several minutes.  She feels intermittent lightheadedness with first standing up and feels off balance when she is walking at times.   Upon questioning of the patient she notes that has also had similar symptoms in the past, seen by Dr. Rosana Berger and felt to be related to orthostatic hypotension, going back to 2016 per notes.  She saw Dr. Posey Pronto, neurology, 09/14/14 for left arm parasthesia with notation that cervical  radiculopathy was possible.  She was recommended to have NCS/EMG but the patient decided against further testing she says due to her age.  Past Medical History:  Diagnosis Date  . Allergy   . Anemia   . Asthma    Dr. Melvyn Novas  . Blood transfusion without reported diagnosis   . Breast cyst    Left  . Cataract    Dr. Gershon Crane  . Complication of anesthesia    ' I HAD HALLUCINATIONS WHEN THEY DID MY HIP "  . GERD (gastroesophageal reflux disease)   . Heart murmur   . Hemorrhoids   . History of colonic polyps [name]   hx of adenomas (small) 1998  . OA (osteoarthritis)   . Osteopenia   . PAC (premature atrial contraction)   . Vitamin D deficiency     Past Surgical History:  Procedure Laterality Date  . ABDOMINAL HYSTERECTOMY     complete  . APPENDECTOMY    . BREAST CYST EXCISION    . CATARACT EXTRACTION, BILATERAL     04/23/18 left eye, 04/02/18 right eye  . COLONOSCOPY  Multiple   Adenomatous colon polyps  . ESOPHAGOGASTRODUODENOSCOPY  2010  . INTRAMEDULLARY (IM) NAIL INTERTROCHANTERIC Right 06/13/2016   Procedure: INTRAMEDULLARY (IM) NAIL INTERTROCHANTRIC;  Surgeon: Tania Ade, MD;  Location: [place];  Service: Orthopedics;  Laterality: Right;  . TONSILLECTOMY      Current Medications: Current Meds  Medication Sig  . albuterol (PROVENTIL HFA;VENTOLIN HFA) 108 (90 Base) MCG/ACT inhaler Inhale 1-2  puffs into the lungs every 6 (six) hours.  Marland Kitchen aspirin 81 MG tablet Take 81 mg by mouth daily.  . Calcium-Magnesium-Vitamin D (CALCIUM 500 PO) Take 1 each by mouth daily.  . Cholecalciferol 1000 UNITS tablet Take 1,000 Units by mouth daily.    . [employer] OMEGA-3 KRILL OIL 500 MG CAPS Take 500 capsules by mouth daily.  . fluticasone-salmeterol (ADVAIR HFA) 115-21 MCG/ACT inhaler Inhale 2 puffs into the lungs 2 (two) times daily.  Marland Kitchen loratadine (CLARITIN) 10 MG tablet Take 10 mg by mouth daily.   Marland Kitchen omeprazole (PRILOSEC) 40 MG capsule Take 40 mg by mouth daily.     Allergies:   Fosamax  [alendronate sodium]; Gabapentin; Lidocaine; Risedronate sodium; Shellfish allergy; and Statins   Social History   Socioeconomic History  . Marital status: Widowed    Spouse name: Not on file  . Number of children: Not on file  . Years of education: Not on file  . Highest education level: Not on file  Occupational History  . Occupation: retired  Scientific laboratory technician  . Financial resource strain: Not on file  . Food insecurity:    Worry: Not on file    Inability: Not on file  . Transportation needs:    Medical: Not on file    Non-medical: Not on file  Tobacco Use  . Smoking status: Never Smoker  . Smokeless tobacco: Never Used  Substance and Sexual Activity  . Alcohol use: Yes    Alcohol/week: 0.0 standard drinks    Comment: 3-4 times per week - vodka 1 drink  . Drug use: No  . Sexual activity: Never  Lifestyle  . Physical activity:    Days per week: Not on file    Minutes per session: Not on file  . Stress: Not on file  Relationships  . Social connections:    Talks on phone: Not on file    Gets together: Not on file    Attends religious service: Not on file    Active member of club or organization: Not on file    Attends meetings of clubs or organizations: Not on file    Relationship status: Not on file  Other Topics Concern  . Not on file  Social History Narrative   She lives alone in a [place] home.  No children.   Retired Education officer, museum.   Highest level of education:  Masters degree     Family History: The patient's family history includes Diabetes (age of onset: 73) in her mother; Hypertension in her other. There is no history of Colon cancer, Pancreatic cancer, Stomach cancer, Esophageal cancer, or Rectal cancer. ROS:   Please see the history of present illness.     All other systems reviewed and are negative.  EKGs/Labs/Other Studies Reviewed:    The following studies were reviewed today:  Lexiscan Myoview 04/25/2016 Study Highlights    Nuclear stress  EF: 55%.  There was no ST segment deviation noted during stress.  The study is normal.  This is a low risk study.  The left ventricular ejection fraction is normal (55-65%).   Normal resting and stress perfusion. No ischemia or infarction EF 55%      Echocardiogram 04/20/2016 Study Conclusions - Left ventricle: The cavity size was normal. Systolic function was   normal. The estimated ejection fraction was in the range of 50%   to 55%. Wall motion was normal; there were no regional wall   motion abnormalities. Doppler parameters are consistent with  abnormal left ventricular relaxation (grade 1 diastolic   dysfunction). - Aortic valve: There was mild regurgitation. - Aorta: Aortic root dimension: 40 mm (ED). - Ascending aorta: The ascending aorta was mildly dilated. - Mitral valve: There was moderate regurgitation.  EKG:  EKG is ordered today.  The ekg ordered today demonstrates normal sinus rhythm with nonspecific ST/T changes, no significant change from previous EKG in 2018  Recent Labs: 04/10/2018: ALT 11; TSH 1.37 05/13/2018: BUN 12; Creatinine, Ser 0.90; Hemoglobin 11.8; Platelets 295; Potassium 3.6; Sodium 135   Recent Lipid Panel    Component Value Date/Time   CHOL 258 (H) 04/10/2018 1207   TRIG 68.0 04/10/2018 1207   HDL 119.60 04/10/2018 1207   CHOLHDL 2 04/10/2018 1207   VLDL 13.6 04/10/2018 1207   LDLCALC 124 (H) 04/10/2018 1207   LDLDIRECT 104.0 03/17/2013 1026    Physical Exam:    VS:  Ht 5\' 2"  (1.575 m)   Wt 118 lb 12.8 oz (53.9 kg)   BMI 21.73 kg/m     Wt Readings from Last 3 Encounters:  05/15/18 118 lb 12.8 oz (53.9 kg)  05/13/18 119 lb (54 kg)  04/28/18 119 lb (54 kg)    Orthostatic VS for the past 24 hrs (Last 3 readings):  BP- Lying Pulse- Lying BP- Sitting Pulse- Sitting BP- Standing at 0 minutes Pulse- Standing at 0 minutes BP- Standing at 3 minutes Pulse- Standing at 3 minutes  05/15/18 1509 114/78 85 122/78 88 92/62 95 96/62 93      Physical Exam  Constitutional: She is oriented to person, place, and time. She appears well-developed and well-nourished. No distress.  HENT:  Head: Normocephalic and atraumatic.  Neck: Normal range of motion. Neck supple. No JVD present.  Cardiovascular: Normal rate, regular rhythm, normal heart sounds and intact distal pulses. Exam reveals no gallop and no friction rub.  No murmur heard. Pulmonary/Chest: Effort normal and breath sounds normal. No respiratory distress. She has no wheezes. She has no rales.  Abdominal: Soft. Bowel sounds are normal.  Musculoskeletal: Normal range of motion. She exhibits no edema or deformity.  Neurological: She is alert and oriented to person, place, and time.  Skin: Skin is warm and dry.  Psychiatric: She has a normal mood and affect. Her behavior is normal. Judgment and thought content normal.  Vitals reviewed.    ASSESSMENT:    1. Orthostatic hypotension   2. Chest pressure   3. Aortic valve insufficiency, etiology of cardiac valve disease unspecified   4. Mitral valve insufficiency, unspecified etiology    PLAN:    In order of problems listed above:  Orthostatic hypotension: Patient seen in the ED yesterday for dizziness and found to be orthostatic.  She comes today for further evaluation.  She continues to be mildly orthostatic.  She thinks she gets enough liquid to drink.  She has had similar issues going back to 2016.  She says that her symptoms are mostly similar to what they have been in the past with lightheadedness upon standing and a little off balance with walking. -Instructed on increasing fluid intake to greater than eight 8 ounce glasses per day.  Increase salt intake. -We discussed using compression stockings and spanks/girdle.  She says she has some girldse at home and compression stockings. -Also discussed moving safely, doing foot pumps prior to standing.   Chest tightness: She complains of mild chest tightness with dated  mild shortness of breath.  She is not sure if this coincides with  her dizziness.  She had a normal stress test in 2017.  We discussed repeating her stress test but she really does not want to do that as she was very uncomfortable with the stress test.  She will monitor her chest discomfort and if it does not improve with the above interventions or becomes worse she will contact us to arrange a stress test.  She also has numbness down the left arm but she has had this before and it was evaluated in 2016 by neurology, Dr. Posey Pronto.  Felt to be possible cervical radiculopathy but she deferred further evaluation at that time.  This does not seem to be a new finding.  Aortic regurgitation: Mild by echo in 04/2016.  No murmur appreciated today. Will recheck echo to assess progression of valve disease- I called pt after she left to discuss echo and she agrees to have one scheduled.   Mitral regurgitation: Moderate by echo in 04/27/2016  Medication Adjustments/Labs and Tests Ordered: Current medicines are reviewed at length with the patient today.  Concerns regarding medicines are outlined above. Labs and tests ordered and medication changes are outlined in the patient instructions below:  Patient Instructions  Medication Instructions:  Your physician recommends that you continue on your current medications as directed. Please refer to the Current Medication list given to you today.  If you need a refill on your cardiac medications before your next appointment, please call your pharmacy.   Lab work: None  If you have labs (blood work) drawn today and your tests are completely normal, you will receive your results only by: Marland Kitchen MyChart Message (if you have MyChart) OR . A paper copy in the mail If you have any lab test that is abnormal or we need to change your treatment, we will call you to review the results.  Testing/Procedures: None   Follow-Up: At [facility], you and your health needs are our  priority.  As part of our continuing mission to provide you with exceptional heart care, we have created designated Provider Care Teams.  These Care Teams include your primary Cardiologist (physician) and Advanced Practice Providers (APPs -  Physician Assistants and Nurse Practitioners) who all work together to provide you with the care you need, when you need it. You will need a follow up appointment in: 1 months.You may see Dorris Carnes, MD or one of the following Advanced Practice Providers on your designated Care Team: Richardson Dopp, PA-C [place], [place] . [name], NP  Any Other Special Instructions Will Be Listed Below (If Applicable).   Orthostatic Hypotension Orthostatic hypotension is a sudden drop in blood pressure that happens when you quickly change positions, such as when you get up from a seated or lying position. Blood pressure is a measurement of how strongly, or weakly, your blood is pressing against the walls of your arteries. Arteries are blood vessels that carry blood from your heart throughout your body. When blood pressure is too low, you may not get enough blood to your brain or to the rest of your organs. This can cause weakness, light-headedness, rapid heartbeat, and fainting. This can last for just a few seconds or for up to a few minutes. Orthostatic hypotension is usually not a serious problem. However, if it happens frequently or gets worse, it may be a sign of something more serious. What are the causes? This condition may be caused by:  Sudden changes in posture, such as standing up quickly after you have been sitting or  lying down.  Blood loss.  Loss of body fluids (dehydration).  Heart problems.  Hormone (endocrine) problems.  Pregnancy.  Severe infection.  Lack of certain nutrients.  Severe allergic reactions (anaphylaxis).  Certain medicines, such as blood pressure medicine or medicines that make the body lose excess fluids (diuretics).  Sometimes, this condition can be caused by not taking medicine as directed, such as taking too much of a certain medicine.  What increases the risk? Certain factors can make you more likely to develop orthostatic hypotension, including:  Age. Risk increases as you get older.  Conditions that affect the heart or the central nervous system.  Taking certain medicines, such as blood pressure medicine or diuretics.  Being pregnant.  What are the signs or symptoms? Symptoms of this condition may include:  Weakness.  Light-headedness.  Dizziness.  Blurred vision.  Fatigue.  Rapid heartbeat.  Fainting, in severe cases.  How is this diagnosed? This condition is diagnosed based on:  Your medical history.  Your symptoms.  Your blood pressure measurement. Your health care provider will check your blood pressure when you are: ? Lying down. ? Sitting. ? Standing.  A blood pressure reading is recorded as two numbers, such as "120 over 80" (or 120/80). The first ("top") number is called the systolic pressure. It is a measure of the pressure in your arteries as your heart beats. The second ("bottom") number is called the diastolic pressure. It is a measure of the pressure in your arteries when your heart relaxes between beats. Blood pressure is measured in a unit called mm Hg. Healthy blood pressure for adults is 120/80. If your blood pressure is below 90/60, you may be diagnosed with hypotension. Other information or tests that may be used to diagnose orthostatic hypotension include:  Your other vital signs, such as your heart rate and temperature.  Blood tests.  Tilt table test. For this test, you will be safely secured to a table that moves you from a lying position to an upright position. Your heart rhythm and blood pressure will be monitored during the test.  How is this treated? Treatment for this condition may include:  Changing your diet. This may involve eating more salt  (sodium) or drinking more water.  Try to drink at least 8 cups of water a day   Taking medicines to raise your blood pressure.  Changing the dosage of certain medicines you are taking that might be lowering your blood pressure.  Wearing compression stockings. These stockings help to prevent blood clots and reduce swelling in your legs.  Wear spanks or a girdle   In some cases, you may need to go to the hospital for:  Fluid replacement. This means you will receive fluids through an IV tube.  Blood replacement. This means you will receive donated blood through an IV tube (transfusion).  Treating an infection or heart problems, if this applies.  Monitoring. You may need to be monitored while medicines that you are taking wear off.  Follow these instructions at home: Eating and drinking   Drink enough fluid to keep your urine clear or pale yellow.  Eat a healthy diet and follow instructions from your health care provider about eating or drinking restrictions. A healthy diet includes: ? Fresh fruits and vegetables. ? Whole grains. ? Lean meats. ? Low-fat dairy products.  Eat extra salt only as directed. Do not add extra salt to your diet unless your health care provider told you to do that.  Eat  frequent, small meals.  Avoid standing up suddenly after eating. Medicines  Take over-the-counter and prescription medicines only as told by your health care provider. ? Follow instructions from your health care provider about changing the dosage of your current medicines, if this applies. ? Do not stop or adjust any of your medicines on your own. General instructions  Wear compression stockings as told by your health care provider.  Get up slowly from lying down or sitting positions. This gives your blood pressure a chance to adjust.  Avoid hot showers and excessive heat as directed by your health care provider.  Return to your normal activities as told by your health care  provider. Ask your health care provider what activities are safe for you.  Do not use any products that contain nicotine or tobacco, such as cigarettes and e-cigarettes. If you need help quitting, ask your health care provider.  Keep all follow-up visits as told by your health care provider. This is important. Contact a health care provider if:  You vomit.  You have diarrhea.  You have a fever for more than 2-3 days.  You feel more thirsty than usual.  You feel weak and tired. Get help right away if:  You have chest pain.  You have a fast or irregular heartbeat.  You develop numbness in any part of your body.  You cannot move your arms or your legs.  You have trouble speaking.  You become sweaty or feel lightheaded.  You faint.  You feel short of breath.  You have trouble staying awake.  You feel confused. This information is not intended to replace advice given to you by your health care provider. Make sure you discuss any questions you have with your health care provider. Document Released: 06/15/2002 Document Revised: 03/13/2016 Document Reviewed: 12/16/2015 Elsevier Interactive Patient Education  2018 [place], [name], NP  05/15/2018 5:14 PM     [facility]

## 2018-05-15 NOTE — Patient Instructions (Addendum)
Medication Instructions:  Your physician recommends that you continue on your current medications as directed. Please refer to the Current Medication list given to you today.  If you need a refill on your cardiac medications before your next appointment, please call your pharmacy.   Lab work: None  If you have labs (blood work) drawn today and your tests are completely normal, you will receive your results only by: Marland Kitchen MyChart Message (if you have MyChart) OR . A paper copy in the mail If you have any lab test that is abnormal or we need to change your treatment, we will call you to review the results.  Testing/Procedures: None   Follow-Up: At [facility], you and your health needs are our priority.  As part of our continuing mission to provide you with exceptional heart care, we have created designated Provider Care Teams.  These Care Teams include your primary Cardiologist (physician) and Advanced Practice Providers (APPs -  Physician Assistants and Nurse Practitioners) who all work together to provide you with the care you need, when you need it. You will need a follow up appointment in: 1 months.You may see Dorris Carnes, MD or one of the following Advanced Practice Providers on your designated Care Team: Richardson Dopp, PA-C [place], [place] . [name], NP  Any Other Special Instructions Will Be Listed Below (If Applicable).   Orthostatic Hypotension Orthostatic hypotension is a sudden drop in blood pressure that happens when you quickly change positions, such as when you get up from a seated or lying position. Blood pressure is a measurement of how strongly, or weakly, your blood is pressing against the walls of your arteries. Arteries are blood vessels that carry blood from your heart throughout your body. When blood pressure is too low, you may not get enough blood to your brain or to the rest of your organs. This can cause weakness, light-headedness, rapid heartbeat, and  fainting. This can last for just a few seconds or for up to a few minutes. Orthostatic hypotension is usually not a serious problem. However, if it happens frequently or gets worse, it may be a sign of something more serious. What are the causes? This condition may be caused by:  Sudden changes in posture, such as standing up quickly after you have been sitting or lying down.  Blood loss.  Loss of body fluids (dehydration).  Heart problems.  Hormone (endocrine) problems.  Pregnancy.  Severe infection.  Lack of certain nutrients.  Severe allergic reactions (anaphylaxis).  Certain medicines, such as blood pressure medicine or medicines that make the body lose excess fluids (diuretics). Sometimes, this condition can be caused by not taking medicine as directed, such as taking too much of a certain medicine.  What increases the risk? Certain factors can make you more likely to develop orthostatic hypotension, including:  Age. Risk increases as you get older.  Conditions that affect the heart or the central nervous system.  Taking certain medicines, such as blood pressure medicine or diuretics.  Being pregnant.  What are the signs or symptoms? Symptoms of this condition may include:  Weakness.  Light-headedness.  Dizziness.  Blurred vision.  Fatigue.  Rapid heartbeat.  Fainting, in severe cases.  How is this diagnosed? This condition is diagnosed based on:  Your medical history.  Your symptoms.  Your blood pressure measurement. Your health care provider will check your blood pressure when you are: ? Lying down. ? Sitting. ? Standing.  A blood pressure reading is recorded  as two numbers, such as "120 over 80" (or 120/80). The first ("top") number is called the systolic pressure. It is a measure of the pressure in your arteries as your heart beats. The second ("bottom") number is called the diastolic pressure. It is a measure of the pressure in your arteries  when your heart relaxes between beats. Blood pressure is measured in a unit called mm Hg. Healthy blood pressure for adults is 120/80. If your blood pressure is below 90/60, you may be diagnosed with hypotension. Other information or tests that may be used to diagnose orthostatic hypotension include:  Your other vital signs, such as your heart rate and temperature.  Blood tests.  Tilt table test. For this test, you will be safely secured to a table that moves you from a lying position to an upright position. Your heart rhythm and blood pressure will be monitored during the test.  How is this treated? Treatment for this condition may include:  Changing your diet. This may involve eating more salt (sodium) or drinking more water.  Try to drink at least 8 cups of water a day   Taking medicines to raise your blood pressure.  Changing the dosage of certain medicines you are taking that might be lowering your blood pressure.  Wearing compression stockings. These stockings help to prevent blood clots and reduce swelling in your legs.  Wear spanks or a girdle   In some cases, you may need to go to the hospital for:  Fluid replacement. This means you will receive fluids through an IV tube.  Blood replacement. This means you will receive donated blood through an IV tube (transfusion).  Treating an infection or heart problems, if this applies.  Monitoring. You may need to be monitored while medicines that you are taking wear off.  Follow these instructions at home: Eating and drinking   Drink enough fluid to keep your urine clear or pale yellow.  Eat a healthy diet and follow instructions from your health care provider about eating or drinking restrictions. A healthy diet includes: ? Fresh fruits and vegetables. ? Whole grains. ? Lean meats. ? Low-fat dairy products.  Eat extra salt only as directed. Do not add extra salt to your diet unless your health care provider told you to do  that.  Eat frequent, small meals.  Avoid standing up suddenly after eating. Medicines  Take over-the-counter and prescription medicines only as told by your health care provider. ? Follow instructions from your health care provider about changing the dosage of your current medicines, if this applies. ? Do not stop or adjust any of your medicines on your own. General instructions  Wear compression stockings as told by your health care provider.  Get up slowly from lying down or sitting positions. This gives your blood pressure a chance to adjust.  Avoid hot showers and excessive heat as directed by your health care provider.  Return to your normal activities as told by your health care provider. Ask your health care provider what activities are safe for you.  Do not use any products that contain nicotine or tobacco, such as cigarettes and e-cigarettes. If you need help quitting, ask your health care provider.  Keep all follow-up visits as told by your health care provider. This is important. Contact a health care provider if:  You vomit.  You have diarrhea.  You have a fever for more than 2-3 days.  You feel more thirsty than usual.  You feel weak and  tired. Get help right away if:  You have chest pain.  You have a fast or irregular heartbeat.  You develop numbness in any part of your body.  You cannot move your arms or your legs.  You have trouble speaking.  You become sweaty or feel lightheaded.  You faint.  You feel short of breath.  You have trouble staying awake.  You feel confused. This information is not intended to replace advice given to you by your health care provider. Make sure you discuss any questions you have with your health care provider. Document Released: 06/15/2002 Document Revised: 03/13/2016 Document Reviewed: 12/16/2015 Elsevier Interactive Patient Education  2018 [employer].

## 2018-05-19 ENCOUNTER — Encounter: Payer: Self-pay | Admitting: Internal Medicine

## 2018-05-19 ENCOUNTER — Ambulatory Visit (INDEPENDENT_AMBULATORY_CARE_PROVIDER_SITE_OTHER): Payer: Medicare Other | Admitting: Internal Medicine

## 2018-05-19 DIAGNOSIS — I950 Idiopathic hypotension: Secondary | ICD-10-CM

## 2018-05-19 DIAGNOSIS — W19XXXS Unspecified fall, sequela: Secondary | ICD-10-CM

## 2018-05-19 DIAGNOSIS — R42 Dizziness and giddiness: Secondary | ICD-10-CM | POA: Diagnosis not present

## 2018-05-19 NOTE — Assessment & Plan Note (Signed)
Resolved initially with more salty chips, coke Consider Florinef S/p cardiol consult ECHO is pending

## 2018-05-19 NOTE — Addendum Note (Signed)
Addended by: Carylon Perches on: 05/19/2018 09:42 AM   Modules accepted: Orders

## 2018-05-19 NOTE — Progress Notes (Signed)
Subjective:  Patient ID: Cindy Meza, female    DOB: 05-08-1936  Age: 82 y.o. MRN: [id number]  CC: No chief complaint on file.   HPI Cindy Meza presents for ER f/u for weakness and palpitations    Outpatient Medications Prior to Visit  Medication Sig Dispense Refill  . albuterol (PROVENTIL HFA;VENTOLIN HFA) 108 (90 Base) MCG/ACT inhaler Inhale 1-2 puffs into the lungs every 6 (six) hours.    Marland Kitchen aspirin 81 MG tablet Take 81 mg by mouth daily.    . Calcium-Magnesium-Vitamin D (CALCIUM 500 PO) Take 1 each by mouth daily.    . Cholecalciferol 1000 UNITS tablet Take 1,000 Units by mouth daily.      . [employer] OMEGA-3 KRILL OIL 500 MG CAPS Take 500 capsules by mouth daily.    . fluticasone-salmeterol (ADVAIR HFA) 115-21 MCG/ACT inhaler Inhale 2 puffs into the lungs 2 (two) times daily. 1 Inhaler 11  . loratadine (CLARITIN) 10 MG tablet Take 10 mg by mouth daily.     Marland Kitchen omeprazole (PRILOSEC) 40 MG capsule Take 40 mg by mouth daily.     No facility-administered medications prior to visit.     ROS: Review of Systems  Constitutional: Positive for fatigue. Negative for activity change, appetite change, chills and unexpected weight change.  HENT: Negative for congestion, mouth sores and sinus pressure.   Eyes: Negative for visual disturbance.  Respiratory: Negative for cough, chest tightness and shortness of breath.   Cardiovascular: Negative for chest pain.  Gastrointestinal: Negative for abdominal pain and nausea.  Genitourinary: Negative for difficulty urinating, frequency and vaginal pain.  Musculoskeletal: Negative for back pain and gait problem.  Skin: Negative for pallor and rash.  Neurological: Negative for dizziness, tremors, weakness, numbness and headaches.  Psychiatric/Behavioral: Negative for confusion, sleep disturbance and suicidal ideas.    Objective:  BP 128/76 (BP Location: Left Arm, Patient Position: Sitting, Cuff Size: Normal)   Pulse 83   Temp 98.2 F (36.8 C)  (Oral)   Ht 5\' 2"  (1.575 m)   Wt 118 lb (53.5 kg)   SpO2 99%   BMI 21.58 kg/m   BP Readings from Last 3 Encounters:  05/19/18 128/76  05/15/18 114/78  05/14/18 (!) 158/82    Wt Readings from Last 3 Encounters:  05/19/18 118 lb (53.5 kg)  05/15/18 118 lb 12.8 oz (53.9 kg)  05/13/18 119 lb (54 kg)    Physical Exam  Constitutional: She appears well-developed. No distress.  HENT:  Head: Normocephalic.  Right Ear: External ear normal.  Left Ear: External ear normal.  Nose: Nose normal.  Mouth/Throat: Oropharynx is clear and moist.  Eyes: Pupils are equal, round, and reactive to light. Conjunctivae are normal. Right eye exhibits no discharge. Left eye exhibits no discharge.  Neck: Normal range of motion. Neck supple. No JVD present. No tracheal deviation present. No thyromegaly present.  Cardiovascular: Normal rate, regular rhythm and normal heart sounds.  Pulmonary/Chest: No stridor. No respiratory distress. She has no wheezes.  Abdominal: Soft. Bowel sounds are normal. She exhibits no distension and no mass. There is no tenderness. There is no rebound and no guarding.  Musculoskeletal: She exhibits no edema or tenderness.  Lymphadenopathy:    She has no cervical adenopathy.  Neurological: She displays normal reflexes. No cranial nerve deficit. She exhibits normal muscle tone. Coordination normal.  Skin: No rash noted. No erythema.  Psychiatric: She has a normal mood and affect. Her behavior is normal. Judgment and thought content normal.  no edema  Lab Results  Component Value Date   WBC 5.9 05/13/2018   HGB 11.8 (L) 05/13/2018   HCT 37.5 05/13/2018   PLT 295 05/13/2018   GLUCOSE 135 (H) 05/13/2018   CHOL 258 (H) 04/10/2018   TRIG 68.0 04/10/2018   HDL 119.60 04/10/2018   LDLDIRECT 104.0 03/17/2013   LDLCALC 124 (H) 04/10/2018   ALT 11 04/10/2018   AST 20 04/10/2018   NA 135 05/13/2018   K 3.6 05/13/2018   CL 101 05/13/2018   CREATININE 0.90 05/13/2018   BUN 12  05/13/2018   CO2 25 05/13/2018   TSH 1.37 04/10/2018   INR 1.04 06/12/2016   HGBA1C 4.9 04/09/2017    Dg Chest 2 View  Result Date: 05/13/2018 CLINICAL DATA:  Midsternal chest pain since Saturday. EXAM: CHEST - 2 VIEW COMPARISON:  June 12, 2016 FINDINGS: The heart size and mediastinal contours are within normal limits. There is no focal infiltrate, pulmonary edema, or pleural effusion. The visualized skeletal structures are unremarkable. IMPRESSION: No active cardiopulmonary disease. Electronically Signed   By: Abelardo Diesel M.D.   On: 05/13/2018 18:16    Assessment & Plan:   There are no diagnoses linked to this encounter.   No orders of the defined types were placed in this encounter.    Follow-up: No follow-ups on file.  [name], MD

## 2018-05-19 NOTE — Assessment & Plan Note (Signed)
No relapse 

## 2018-05-19 NOTE — Patient Instructions (Signed)
Compression socks Yoga leggings

## 2018-05-20 ENCOUNTER — Ambulatory Visit (HOSPITAL_COMMUNITY): Payer: Medicare Other | Attending: Cardiology

## 2018-05-20 ENCOUNTER — Other Ambulatory Visit: Payer: Self-pay

## 2018-05-20 DIAGNOSIS — I340 Nonrheumatic mitral (valve) insufficiency: Secondary | ICD-10-CM | POA: Diagnosis present

## 2018-05-28 ENCOUNTER — Ambulatory Visit: Payer: Self-pay

## 2018-05-28 ENCOUNTER — Ambulatory Visit (INDEPENDENT_AMBULATORY_CARE_PROVIDER_SITE_OTHER): Payer: Medicare Other | Admitting: Family

## 2018-05-28 ENCOUNTER — Encounter: Payer: Self-pay | Admitting: Family

## 2018-05-28 VITALS — BP 126/80 | HR 84 | Temp 97.5°F | Ht 62.0 in | Wt 116.1 lb

## 2018-05-28 DIAGNOSIS — R059 Cough, unspecified: Secondary | ICD-10-CM

## 2018-05-28 DIAGNOSIS — R05 Cough: Secondary | ICD-10-CM

## 2018-05-28 LAB — POCT EXHALED NITRIC OXIDE: FENO LEVEL (PPB): 15

## 2018-05-28 MED ORDER — ALBUTEROL SULFATE HFA 108 (90 BASE) MCG/ACT IN AERS: 1.0000 | INHALATION_SPRAY | Freq: Four times a day (QID) | RESPIRATORY_TRACT | 1 refills | Status: DC

## 2018-05-28 MED ORDER — PANTOPRAZOLE SODIUM 40 MG PO TBEC: 40.0000 mg | DELAYED_RELEASE_TABLET | Freq: Every day | ORAL | 3 refills | Status: DC

## 2018-05-28 NOTE — Progress Notes (Signed)
Cindy Meza is a 82 y.o. female with the following history as recorded in EpicCare:  Patient Active Problem List   Diagnosis Date Noted  . Fall 06/12/2016  . Closed right hip fracture ([place]) 06/12/2016  . Cerumen impaction 04/06/2016  . Fatigue 02/15/2016  . DOE (dyspnea on exertion) 02/15/2016  . Headache 09/27/2015  . Coronary atherosclerosis 06/29/2015  . Urgency of urination 05/17/2015  . Paresthesia of arm 09/21/2014  . Breast pain, left 09/21/2014  . Chest pressure 07/22/2012  . Lightheadedness 07/17/2012  . Hypotension 07/17/2012  . Hot flash, menopausal 02/15/2012  . Allergic rhinitis 09/12/2011  . Well adult exam 02/14/2011  . HEMORRHOIDS, WITH BLEEDING 10/05/2009  . Vitamin D deficiency 06/17/2007  . Asthma 06/17/2007  . GERD (gastroesophageal reflux disease) 06/17/2007  . OSTEOARTHRITIS 06/17/2007  . Osteoporosis 06/17/2007  . COLONIC POLYPS, HX OF 06/17/2007    Current Outpatient Medications  Medication Sig Dispense Refill  . albuterol (PROVENTIL HFA;VENTOLIN HFA) 108 (90 Base) MCG/ACT inhaler Inhale 1-2 puffs into the lungs every 6 (six) hours. 1 Inhaler 1  . aspirin 81 MG tablet Take 81 mg by mouth daily.    . Calcium-Magnesium-Vitamin D (CALCIUM 500 PO) Take 1 each by mouth daily.    . Cholecalciferol 1000 UNITS tablet Take 1,000 Units by mouth daily.      . CVS OMEGA-3 KRILL OIL 500 MG CAPS Take 500 capsules by mouth daily.    . fluticasone-salmeterol (ADVAIR HFA) 115-21 MCG/ACT inhaler Inhale 2 puffs into the lungs 2 (two) times daily. 1 Inhaler 11  . loratadine (CLARITIN) 10 MG tablet Take 10 mg by mouth daily.     . pantoprazole (PROTONIX) 40 MG tablet Take 1 tablet (40 mg total) by mouth daily. 30 tablet 3   No current facility-administered medications for this visit.     Allergies: Fosamax [alendronate sodium]; Gabapentin; Lidocaine; Risedronate sodium; Shellfish allergy; and Statins  Past Medical History:  Diagnosis Date  . Allergy   . Anemia    . Asthma    Dr. Melvyn Novas  . Blood transfusion without reported diagnosis   . Breast cyst    Left  . Cataract    Dr. Gershon Crane  . Complication of anesthesia    ' I HAD HALLUCINATIONS WHEN THEY DID MY HIP "  . GERD (gastroesophageal reflux disease)   . Heart murmur   . Hemorrhoids   . History of colonic polyps [name]   hx of adenomas (small) 1998  . OA (osteoarthritis)   . Osteopenia   . PAC (premature atrial contraction)   . Vitamin D deficiency     Past Surgical History:  Procedure Laterality Date  . ABDOMINAL HYSTERECTOMY     complete  . APPENDECTOMY    . BREAST CYST EXCISION    . CATARACT EXTRACTION, BILATERAL     04/23/18 left eye, 04/02/18 right eye  . COLONOSCOPY  Multiple   Adenomatous colon polyps  . ESOPHAGOGASTRODUODENOSCOPY  2010  . INTRAMEDULLARY (IM) NAIL INTERTROCHANTERIC Right 06/13/2016   Procedure: INTRAMEDULLARY (IM) NAIL INTERTROCHANTRIC;  Surgeon: Tania Ade, MD;  Location: [facility];  Service: Orthopedics;  Laterality: Right;  . TONSILLECTOMY      Family History  Problem Relation Age of Onset  . Diabetes Mother 38       Deceased  . Hypertension Other   . Colon cancer Neg Hx   . Pancreatic cancer Neg Hx   . Stomach cancer Neg Hx   . Esophageal cancer Neg Hx   . Rectal  cancer Neg Hx     Social History   Tobacco Use  . Smoking status: Never Smoker  . Smokeless tobacco: Never Used  Substance Use Topics  . Alcohol use: Yes    Alcohol/week: 0.0 standard drinks    Comment: 3-4 times per week - vodka 1 drink    Subjective:  Patient presents with concerns for dry cough x 7-10 days; feels that cough/ mucus is worse in the morning and improves as the day progresses; occasionally experiences sensation of chest tightness- this is not a new issue for patient;  has recently increased her Advair from qd to bid; feels that GERD is well controlled on Prilosec- supposed to be taking Zantac along with Prilosec for GERD symptoms ; takes Claritin 10 mg daily; had  normal CXR on May 13, 2018; has recently seen cardiology and had echo/ scheduled follow-up there in early December- at this time, patient is not wanting to pursue stress testing.      Objective:  Vitals:   05/28/18 1503  BP: 126/80  Pulse: 84  Temp: (!) 97.5 F (36.4 C)  TempSrc: Oral  SpO2: 99%  Weight: 116 lb 1.3 oz (52.7 kg)  Height: 5\' 2"  (1.575 m)    General: Well developed, well nourished, in no acute distress  Skin : Warm and dry.  Head: Normocephalic and atraumatic  Eyes: Sclera and conjunctiva clear; pupils round and reactive to light; extraocular movements intact  Ears: External normal; canals clear; tympanic membranes normal  Oropharynx: Pink, supple. No suspicious lesions  Neck: Supple without thyromegaly, adenopathy  Lungs: Respirations unlabored; clear to auscultation bilaterally without wheeze, rales, rhonchi  CVS exam: normal rate and regular rhythm.  Abdomen: Soft; nontender; nondistended; normoactive bowel sounds; no masses or hepatosplenomegaly  Neurologic: Alert and oriented; speech intact; face symmetrical; moves all extremities well; CNII-XII intact without focal deficit   Assessment:  1. Cough     Plan:  FeNo done in office- score at 15; CXR was normal in early November; she is already doing her Advair bid; cardiac evaluation has been done; ? Uncontrolled GERD/ esophageal spasm- try changing to Protonix 40 mg bid x 1 week- then decrease to qd; patient to call back with response early next week.   No follow-ups on file.  Orders Placed This Encounter  Procedures  . POCT EXHALED NITRIC OXIDE    Requested Prescriptions   Signed Prescriptions Disp Refills  . pantoprazole (PROTONIX) 40 MG tablet 30 tablet 3    Sig: Take 1 tablet (40 mg total) by mouth daily.  Marland Kitchen albuterol (PROVENTIL HFA;VENTOLIN HFA) 108 (90 Base) MCG/ACT inhaler 1 Inhaler 1    Sig: Inhale 1-2 puffs into the lungs every 6 (six) hours.

## 2018-05-28 NOTE — Telephone Encounter (Signed)
Pt. C/o chest "tightness - not pain." Reports this has been going on for several weeks. Has asthma and is using her Advair inhaler.Asking if Echocardiogram results are back. Still has a dry cough. Reports when she lays down she feels better. Tightness is in center of chest - no radiation of pain.No nausea or sweating. Has some shortness of breath with exertion.Appointment made for today.Instructed if symptoms worsen to go to ED. Verbalizes understanding. Reason for Disposition . [1] Chest pain lasts > 5 minutes AND [2] occurred > 3 days ago (72 hours) AND [3] NO chest pain or cardiac symptoms now  Answer Assessment - Initial Assessment Questions 1. LOCATION: "Where does it hurt?"       Tightness in the middle  2. RADIATION: "Does the pain go anywhere else?" (e.g., into neck, jaw, arms, back)     No 3. ONSET: "When did the chest pain begin?" (Minutes, hours or days)      Started 1 hour ago and when she sits it gets better 4. PATTERN "Does the pain come and go, or has it been constant since it started?"  "Does it get worse with exertion?"      Comes and goes 5. DURATION: "How long does it last" (e.g., seconds, minutes, hours)      It varies 6. SEVERITY: "How bad is the pain?"  (e.g., Scale 1-10; mild, moderate, or severe)    - MILD (1-3): doesn't interfere with normal activities     - MODERATE (4-7): interferes with normal activities or awakens from sleep    - SEVERE (8-10): excruciating pain, unable to do any normal activities       8 7. CARDIAC RISK FACTORS: "Do you have any history of heart problems or risk factors for heart disease?" (e.g., prior heart attack, angina; high blood pressure, diabetes, being overweight, high cholesterol, smoking, or strong family history of heart disease)     No 8. PULMONARY RISK FACTORS: "Do you have any history of lung disease?"  (e.g., blood clots in lung, asthma, emphysema, birth control pills)     Asthma - uses Advair 9. CAUSE: "What do you think is  causing the chest pain?"     Unsure 10. OTHER SYMPTOMS: "Do you have any other symptoms?" (e.g., dizziness, nausea, vomiting, sweating, fever, difficulty breathing, cough)       Some shortness of breath with moving around. 11. PREGNANCY: "Is there any chance you are pregnant?" "When was your last menstrual period?"       No  Protocols used: CHEST PAIN-A-AH

## 2018-05-29 ENCOUNTER — Telehealth: Payer: Self-pay | Admitting: Cardiology

## 2018-05-29 DIAGNOSIS — I491 Atrial premature depolarization: Secondary | ICD-10-CM

## 2018-05-29 DIAGNOSIS — I493 Ventricular premature depolarization: Secondary | ICD-10-CM

## 2018-05-29 NOTE — Telephone Encounter (Signed)
I did not see patient in clinic   I saw her last in 2017 Echo with normal LV and RV function    Mild to mod AI   Atrial septal aneurysm is an incidental finding    Echo did report ectopy    Could set up for 24 hour holter to document burden If not severe could then consider cardiacCT angiogram

## 2018-05-29 NOTE — Telephone Encounter (Signed)
Called and made patient aware of echo results and recommendations to have 24 hour holter to assess ectopy burden. Patient verbalized understanding. Appointment made for 12/2 at 3:30 PM. Patient will pan to keep follow up appointment with Cindy Ades, NP on 12/9.

## 2018-05-29 NOTE — Telephone Encounter (Signed)
Pt called to request her ECHO results... I advised her that [name] NP is wanting Dr. Harrington Challenger to look at it for her review based on the pt symptoms and I promised to call her after I forward the message on to DR. Ross and hear back re: her recommendations. Pt verbalized understanding and agreed.

## 2018-05-29 NOTE — Telephone Encounter (Signed)
Follow up  Patient is calling with echo results

## 2018-05-29 NOTE — Telephone Encounter (Signed)
New Message ° ° °Patient is returning call in reference to echocardiogram results.  °

## 2018-06-09 ENCOUNTER — Ambulatory Visit (INDEPENDENT_AMBULATORY_CARE_PROVIDER_SITE_OTHER): Payer: Medicare Other

## 2018-06-09 DIAGNOSIS — I491 Atrial premature depolarization: Secondary | ICD-10-CM

## 2018-06-09 DIAGNOSIS — I493 Ventricular premature depolarization: Secondary | ICD-10-CM

## 2018-06-16 ENCOUNTER — Encounter: Payer: Self-pay | Admitting: Cardiology

## 2018-06-16 ENCOUNTER — Ambulatory Visit (INDEPENDENT_AMBULATORY_CARE_PROVIDER_SITE_OTHER): Payer: Medicare Other | Admitting: Cardiology

## 2018-06-16 VITALS — BP 130/78 | HR 78 | Ht 62.0 in | Wt 116.1 lb

## 2018-06-16 DIAGNOSIS — I351 Nonrheumatic aortic (valve) insufficiency: Secondary | ICD-10-CM | POA: Diagnosis not present

## 2018-06-16 DIAGNOSIS — I951 Orthostatic hypotension: Secondary | ICD-10-CM

## 2018-06-16 DIAGNOSIS — R079 Chest pain, unspecified: Secondary | ICD-10-CM

## 2018-06-16 DIAGNOSIS — I471 Supraventricular tachycardia: Secondary | ICD-10-CM | POA: Diagnosis not present

## 2018-06-16 MED ORDER — METOPROLOL TARTRATE 25 MG PO TABS: 12.5000 mg | ORAL_TABLET | Freq: Two times a day (BID) | ORAL | 3 refills | Status: DC

## 2018-06-16 NOTE — Progress Notes (Signed)
Cardiology Office Note:    Date:  06/16/2018   ID:  [name], DOB 1936-05-20, MRN [id number]  PCP:  Cassandria Anger, MD  Cardiologist:  Dorris Carnes, MD  Referring MD: Cassandria Anger, MD   Chief Complaint  Patient presents with  . Follow-up    History of Present Illness:    Cindy Meza is a 82 y.o. female with a past medical history significant for arthritis, GERD, asthma.  Cindy Meza has been seen one time in our office by Dr. Harrington Challenger in 04/2016 with complaints of fatigue and shortness of breath with exertion.  She underwent a normal stress Myoview on 04/25/2016 and an echocardiogram on 04/20/2016 showing EF 58-09%, grade 1 diastolic dysfunction, mild AR and moderate MR.  She was seen in the ED on 04/24/18 with dehydration. She follow up with her PCP and was told to eat salty foods.   The patient presented to the ED on 05/13/2018 with complaints of chest tightness, recurrent for several weeks, nonexertional and nonradiating.  She did have tingling of her left arm not always associated with chest tightness.  She complained of lightheadedness usually in the mornings and with standing.  EKG showed no ischemic changes and troponin was negative. Orthostatics were positive going from the 160s to the 130s.  She was recommended to increase oral intake.  Seen by me on 05/15/2018 for orthostatic hypotension after ED visit.  She continued to be mildly orthostatic.  Has had similar issues going back to 2016.  On no blood pressure lowering medications.  Advised to increase fluid intake and salt intake, using compression stockings and spanks/girdle.  Chest tightness with mild shortness of breath  Cindy. Meza is here today alone for follow up. She has had some mild chest tightness and occ shortness of breath but not with every episode of chest tightness. Lasts for a few minutes. With mild activity, better with rest.  Does not occur everyday, once in the last 3 days. Has had an episode  at church when she was more active. No recent left arm numbness/tingling.   She is now wearing compression stockings "on most days" not today. She has not been wearing spanx/girdle. She has been rising slowly and doing foot pumps prior to standng. She has slowed herself down, not changing positions so fast.  No recent significant lightheadedness or near syncope.  Home BP log moslty 110's-120's with one day 95/61.   No palpitations.    Past Medical History:  Diagnosis Date  . Allergy   . Anemia   . Asthma    Dr. Melvyn Novas  . Blood transfusion without reported diagnosis   . Breast cyst    Left  . Cataract    Dr. Gershon Crane  . Complication of anesthesia    ' I HAD HALLUCINATIONS WHEN THEY DID MY HIP "  . GERD (gastroesophageal reflux disease)   . Heart murmur   . Hemorrhoids   . History of colonic polyps [name]   hx of adenomas (small) 1998  . OA (osteoarthritis)   . Osteopenia   . PAC (premature atrial contraction)   . Vitamin D deficiency     Past Surgical History:  Procedure Laterality Date  . ABDOMINAL HYSTERECTOMY     complete  . APPENDECTOMY    . BREAST CYST EXCISION    . CATARACT EXTRACTION, BILATERAL     04/23/18 left eye, 04/02/18 right eye  . COLONOSCOPY  Multiple   Adenomatous colon polyps  . ESOPHAGOGASTRODUODENOSCOPY  2010  . INTRAMEDULLARY (IM) NAIL INTERTROCHANTERIC Right 06/13/2016   Procedure: INTRAMEDULLARY (IM) NAIL INTERTROCHANTRIC;  Surgeon: Tania Ade, MD;  Location: [place];  Service: Orthopedics;  Laterality: Right;  . TONSILLECTOMY      Current Medications: Current Meds  Medication Sig  . albuterol (PROVENTIL HFA;VENTOLIN HFA) 108 (90 Base) MCG/ACT inhaler Inhale 1-2 puffs into the lungs every 6 (six) hours.  Marland Kitchen aspirin 81 MG tablet Take 81 mg by mouth daily.  . Calcium-Magnesium-Vitamin D (CALCIUM 500 PO) Take 1 each by mouth daily.  . Cholecalciferol 1000 UNITS tablet Take 1,000 Units by mouth daily.    . CVS OMEGA-3 KRILL OIL 500 MG CAPS Take  500 capsules by mouth daily.  . fluticasone-salmeterol (ADVAIR HFA) 115-21 MCG/ACT inhaler Inhale 2 puffs into the lungs 2 (two) times daily.  Marland Kitchen loratadine (CLARITIN) 10 MG tablet Take 10 mg by mouth daily.   . pantoprazole (PROTONIX) 40 MG tablet Take 1 tablet (40 mg total) by mouth daily.     Allergies:   Fosamax [alendronate sodium]; Gabapentin; Lidocaine; Risedronate sodium; Shellfish allergy; and Statins   Social History   Socioeconomic History  . Marital status: Widowed    Spouse name: Not on file  . Number of children: Not on file  . Years of education: Not on file  . Highest education level: Not on file  Occupational History  . Occupation: retired  Scientific laboratory technician  . Financial resource strain: Not on file  . Food insecurity:    Worry: Not on file    Inability: Not on file  . Transportation needs:    Medical: Not on file    Non-medical: Not on file  Tobacco Use  . Smoking status: Never Smoker  . Smokeless tobacco: Never Used  Substance and Sexual Activity  . Alcohol use: Yes    Alcohol/week: 0.0 standard drinks    Comment: 3-4 times per week - vodka 1 drink  . Drug use: No  . Sexual activity: Never  Lifestyle  . Physical activity:    Days per week: Not on file    Minutes per session: Not on file  . Stress: Not on file  Relationships  . Social connections:    Talks on phone: Not on file    Gets together: Not on file    Attends religious service: Not on file    Active member of club or organization: Not on file    Attends meetings of clubs or organizations: Not on file    Relationship status: Not on file  Other Topics Concern  . Not on file  Social History Narrative   She lives alone in a [place] home.  No children.   Retired Education officer, museum.   Highest level of education:  Masters degree     Family History: The patient's family history includes Diabetes (age of onset: 77) in her mother; Hypertension in her other. There is no history of Colon cancer,  Pancreatic cancer, Stomach cancer, Esophageal cancer, or Rectal cancer. ROS:   Please see the history of present illness.     All other systems reviewed and are negative.  EKGs/Labs/Other Studies Reviewed:    The following studies were reviewed today:  Echocardiogram 05/20/2018 Study Conclusions - Left ventricle: The cavity size was normal. Systolic function was   normal. The estimated ejection fraction was in the range of 50%   to 55%. Wall motion was grossly normal, though frequent ectopy   limits sensitivity. In some views, septum appears mildly  hypokinetic. The study is not technically sufficient to allow   evaluation of LV diastolic function. - Aortic valve: There was mild to moderate regurgitation.   Regurgitation pressure half-time: 357 Cindy. - Mitral valve: There was mild regurgitation. - Atrial septum: A septal defect cannot be excluded. There was an   atrial septal aneurysm, predominantly within the right atrial   cavity. - Tricuspid valve: There was mild regurgitation. - Pulmonic valve: There was no significant regurgitation. - Pulmonary arteries: Systolic pressure was within the normal   range.  Impressions: - Frequent ectopy limits sensitivity. Grossly normal LVEF and wall   motion, though septum appears mildly hypokinetic in some views.   Aortic valve with mild (by jet) to moderate (by gradient)   regurgitation.  Carlton Adam Myoview 04/25/2016 Study Highlights    Nuclear stress EF: 55%.  There was no ST segment deviation noted during stress.  The study is normal.  This is a low risk study.  The left ventricular ejection fraction is normal (55-65%).  Normal resting and stress perfusion. No ischemia or infarction EF 55%     Echocardiogram 04/20/2016 Study Conclusions - Left ventricle: The cavity size was normal. Systolic function was normal. The estimated ejection fraction was in the range of 50% to 55%. Wall motion was normal; there were no  regional wall motion abnormalities. Doppler parameters are consistent with abnormal left ventricular relaxation (grade 1 diastolic dysfunction). - Aortic valve: There was mild regurgitation. - Aorta: Aortic root dimension: 40 mm (ED). - Ascending aorta: The ascending aorta was mildly dilated. - Mitral valve: There was moderate regurgitation.   EKG:  EKG is not ordered today.    Recent Labs: 04/10/2018: ALT 11; TSH 1.37 05/13/2018: BUN 12; Creatinine, Ser 0.90; Hemoglobin 11.8; Platelets 295; Potassium 3.6; Sodium 135   Recent Lipid Panel    Component Value Date/Time   CHOL 258 (H) 04/10/2018 1207   TRIG 68.0 04/10/2018 1207   HDL 119.60 04/10/2018 1207   CHOLHDL 2 04/10/2018 1207   VLDL 13.6 04/10/2018 1207   LDLCALC 124 (H) 04/10/2018 1207   LDLDIRECT 104.0 03/17/2013 1026    Physical Exam:    VS:  BP 130/78   Pulse 78   Ht 5\' 2"  (1.575 m)   Wt 116 lb 1.9 oz (52.7 kg)   SpO2 97%   BMI 21.24 kg/m     Wt Readings from Last 3 Encounters:  06/16/18 116 lb 1.9 oz (52.7 kg)  05/28/18 116 lb 1.3 oz (52.7 kg)  05/19/18 118 lb (53.5 kg)     Physical Exam  Constitutional: She is oriented to person, place, and time. She appears well-developed and well-nourished. No distress.  HENT:  Head: Normocephalic and atraumatic.  Neck: Normal range of motion. Neck supple. No JVD present.  Cardiovascular: Normal rate, regular rhythm, normal heart sounds and intact distal pulses. Exam reveals no gallop and no friction rub.  No murmur heard. Pulmonary/Chest: Effort normal and breath sounds normal. No respiratory distress. She has no wheezes. She has no rales.  Abdominal: Soft. Bowel sounds are normal.  Musculoskeletal: Normal range of motion. She exhibits no edema or deformity.  Neurological: She is alert and oriented to person, place, and time.  Skin: Skin is warm and dry.  Psychiatric: She has a normal mood and affect. Her behavior is normal. Judgment and thought content normal.   Vitals reviewed.   ASSESSMENT:    1. Orthostatic hypotension   2. Chest pain, unspecified type   3. SVT (supraventricular tachycardia) (  [place])   4. Aortic valve insufficiency, etiology of cardiac valve disease unspecified    PLAN:    In order of problems listed above:  Orthostatic hypotension -Seems to have improved.  Patient is wearing compression stockings and has increased her oral intake.  Chest tightness -Patient continues to have mild intermittent, brief chest tightness with occasional shortness of breath. -Echo on 05/20/2018 showed low normal LV systolic function and grossly normal wall motion although frequent ectopy limited sensitivity. -She had previously declined a stress test but now states that she will now consent to a chemical stress test.  SVT -Holter monitor preliminary results reviewed, not final.  Patient with frequent SVT, longest run 11 beats. -This could possibly be contributing to some of her symptoms. -I will initiate low-dose beta-blocker with metoprolol 12.5 mg twice daily.  Patient is instructed to monitor blood pressure and if systolic blood pressure is running less than 100 she is told that she will need to stop the beta-blocker.  Aortic regurgitation: Mild by jet to moderate by gradient on echo 05/20/2018.  Medication Adjustments/Labs and Tests Ordered: Current medicines are reviewed at length with the patient today.  Concerns regarding medicines are outlined above. Labs and tests ordered and medication changes are outlined in the patient instructions below:  Patient Instructions  Medication Instructions:  START: Metoprolol 12.5 MG twice a day   If you need a refill on your cardiac medications before your next appointment, please call your pharmacy.   Lab work: None   If you have labs (blood work) drawn today and your tests are completely normal, you will receive your results only by: Marland Kitchen MyChart Message (if you have MyChart) OR . A paper copy in  the mail If you have any lab test that is abnormal or we need to change your treatment, we will call you to review the results.  Testing/Procedures: Your physician has requested that you have a lexiscan myoview. For further information please visit [email or web address]. Please follow instruction sheet, as given.   Follow-Up: Follow up with Pecolia Ades, NP in 1 month   Any Other Special Instructions Will Be Listed Below (If Applicable).  Monitor your blood pressure at home , if your top number is consistently running below 100 stop taking the metoprolol      Signed, [name], NP  06/16/2018 7:41 PM    [place]

## 2018-06-16 NOTE — Patient Instructions (Addendum)
Medication Instructions:  START: Metoprolol 12.5 MG twice a day   If you need a refill on your cardiac medications before your next appointment, please call your pharmacy.   Lab work: None   If you have labs (blood work) drawn today and your tests are completely normal, you will receive your results only by: Marland Kitchen MyChart Message (if you have MyChart) OR . A paper copy in the mail If you have any lab test that is abnormal or we need to change your treatment, we will call you to review the results.  Testing/Procedures: Your physician has requested that you have a lexiscan myoview. For further information please visit [email or web address]. Please follow instruction sheet, as given.   Follow-Up: Follow up with Pecolia Ades, NP in 1 month   Any Other Special Instructions Will Be Listed Below (If Applicable).  Monitor your blood pressure at home , if your top number is consistently running below 100 stop taking the metoprolol

## 2018-06-20 ENCOUNTER — Other Ambulatory Visit: Payer: Self-pay | Admitting: Cardiology

## 2018-06-20 NOTE — Progress Notes (Unsigned)
I called pt and informed her of results of monitor and that she does not need to take the metoprolol. Will discontinue this. She is feeling well, has gotten compression stockings and will stay hydrated.

## 2018-06-21 ENCOUNTER — Other Ambulatory Visit: Payer: Self-pay | Admitting: Internal Medicine

## 2018-06-23 ENCOUNTER — Ambulatory Visit: Payer: Self-pay

## 2018-06-23 NOTE — Telephone Encounter (Addendum)
Patient called in with c/o "chest tightness." She says "this is an ongoing problem that I have and it's nothing new, just getting to be annoying. I have a stress test scheduled for Thursday. I called in to see if I can see Dr. Alain Marion sooner than 12/26. I don't have pain, just tightness. It's more prevalent when I'm up moving around doing things, but seems to ease up when I'm sitting resting. Sometimes I have it when I'm sitting. The severity is a 7 and it feels like something is in my throat." I asked about other symptoms, she says "I was dizzy and nauseated on 12/14, but then it went away. Otherwise, no other symptoms. According to protocol, see PCP within 3 days. She says "I can see Mickel Baas or Dr. Alain Marion." No availability with PCP within 3 day. Appointment scheduled for tomorrow at 1100 with Jodi Mourning, FNP, care advice given, patient verbalized understanding. I advised to call the cardiologist office to let them know and maybe the stress test can be moved up a day. She says "It's hard to get anyone to answer. I have to hold so long on the phone. I will just come to [facility] tomorrow and see what she says to do."  Reason for Disposition . [1] Intermittent chest pain from "angina" AND [2] NO increase in severity or frequency  Answer Assessment - Initial Assessment Questions 1. LOCATION: "Where does it hurt?"       Chest tightness, no pain 2. RADIATION: "Does the pain go anywhere else?" (e.g., into neck, jaw, arms, back)     No 3. ONSET: "When did the chest pain begin?" (Minutes, hours or days)      Off and on for some time 4. PATTERN "Does the pain come and go, or has it been constant since it started?"  "Does it get worse with exertion?"      Come and go for a while; seems more prevalent with exertion; not felt at rest 5. DURATION: "How long does it last" (e.g., seconds, minutes, hours)     Yesterday it lasted from 1300-1430, then it eased after sitting down resting.  6. SEVERITY: "How bad is  the pain?"  (e.g., Scale 1-10; mild, moderate, or severe)    - MILD (1-3): doesn't interfere with normal activities     - MODERATE (4-7): interferes with normal activities or awakens from sleep    - SEVERE (8-10): excruciating pain, unable to do any normal activities       Chest tightness a 7; feels like something in my throat 7. CARDIAC RISK FACTORS: "Do you have any history of heart problems or risk factors for heart disease?" (e.g., prior heart attack, angina; high blood pressure, diabetes, being overweight, high cholesterol, smoking, or strong family history of heart disease)     Yes 8. PULMONARY RISK FACTORS: "Do you have any history of lung disease?"  (e.g., blood clots in lung, asthma, emphysema, birth control pills)     Asthma on advair 9. CAUSE: "What do you think is causing the chest pain?"     I don't know 10. OTHER SYMPTOMS: "Do you have any other symptoms?" (e.g., dizziness, nausea, vomiting, sweating, fever, difficulty breathing, cough)       2 days ago had dizziness and nausea 11. PREGNANCY: "Is there any chance you are pregnant?" "When was your last menstrual period?"       No  Protocols used: CHEST PAIN-A-AH

## 2018-06-24 ENCOUNTER — Encounter: Payer: Self-pay | Admitting: Family

## 2018-06-24 ENCOUNTER — Ambulatory Visit (INDEPENDENT_AMBULATORY_CARE_PROVIDER_SITE_OTHER): Payer: Medicare Other | Admitting: Family

## 2018-06-24 VITALS — BP 126/72 | HR 93 | Temp 97.6°F | Ht 62.0 in | Wt 115.0 lb

## 2018-06-24 DIAGNOSIS — R29818 Other symptoms and signs involving the nervous system: Secondary | ICD-10-CM

## 2018-06-24 DIAGNOSIS — R079 Chest pain, unspecified: Secondary | ICD-10-CM | POA: Diagnosis not present

## 2018-06-24 DIAGNOSIS — R42 Dizziness and giddiness: Secondary | ICD-10-CM

## 2018-06-24 MED ORDER — FLUTICASONE PROPIONATE 50 MCG/ACT NA SUSP: 2.0000 | Freq: Every day | NASAL | 6 refills | Status: DC

## 2018-06-24 NOTE — Progress Notes (Signed)
Cindy Meza is a 82 y.o. female with the following history as recorded in EpicCare:  Patient Active Problem List   Diagnosis Date Noted  . Aortic regurgitation 06/16/2018  . Fall 06/12/2016  . Closed right hip fracture ([place]) 06/12/2016  . Cerumen impaction 04/06/2016  . Fatigue 02/15/2016  . DOE (dyspnea on exertion) 02/15/2016  . Headache 09/27/2015  . Coronary atherosclerosis 06/29/2015  . Urgency of urination 05/17/2015  . Paresthesia of arm 09/21/2014  . Breast pain, left 09/21/2014  . Chest pressure 07/22/2012  . Lightheadedness 07/17/2012  . Hypotension 07/17/2012  . Hot flash, menopausal 02/15/2012  . Allergic rhinitis 09/12/2011  . Well adult exam 02/14/2011  . HEMORRHOIDS, WITH BLEEDING 10/05/2009  . Vitamin D deficiency 06/17/2007  . Asthma 06/17/2007  . GERD (gastroesophageal reflux disease) 06/17/2007  . OSTEOARTHRITIS 06/17/2007  . Osteoporosis 06/17/2007  . COLONIC POLYPS, HX OF 06/17/2007    Current Outpatient Medications  Medication Sig Dispense Refill  . ADVAIR HFA 115-21 MCG/ACT inhaler INHALE 2 PUFFS INTO THE LUNGS TWICE DAILY 12 g 1  . albuterol (PROVENTIL HFA;VENTOLIN HFA) 108 (90 Base) MCG/ACT inhaler Inhale 1-2 puffs into the lungs every 6 (six) hours. 1 Inhaler 1  . aspirin 81 MG tablet Take 81 mg by mouth daily.    . Calcium-Magnesium-Vitamin D (CALCIUM 500 PO) Take 1 each by mouth daily.    . Cholecalciferol 1000 UNITS tablet Take 1,000 Units by mouth daily.      . CVS OMEGA-3 KRILL OIL 500 MG CAPS Take 500 capsules by mouth daily.    . fluticasone (FLONASE) 50 MCG/ACT nasal spray Place 2 sprays into both nostrils daily. 16 g 6  . loratadine (CLARITIN) 10 MG tablet Take 10 mg by mouth daily.      No current facility-administered medications for this visit.     Allergies: Fosamax [alendronate sodium]; Gabapentin; Lidocaine; Risedronate sodium; Shellfish allergy; and Statins  Past Medical History:  Diagnosis Date  . Allergy   . Anemia   .  Asthma    Dr. Melvyn Novas  . Blood transfusion without reported diagnosis   . Breast cyst    Left  . Cataract    Dr. Gershon Crane  . Complication of anesthesia    ' I HAD HALLUCINATIONS WHEN THEY DID MY HIP "  . GERD (gastroesophageal reflux disease)   . Heart murmur   . Hemorrhoids   . History of colonic polyps [name]   hx of adenomas (small) 1998  . OA (osteoarthritis)   . Osteopenia   . PAC (premature atrial contraction)   . Vitamin D deficiency     Past Surgical History:  Procedure Laterality Date  . ABDOMINAL HYSTERECTOMY     complete  . APPENDECTOMY    . BREAST CYST EXCISION    . CATARACT EXTRACTION, BILATERAL     04/23/18 left eye, 04/02/18 right eye  . COLONOSCOPY  Multiple   Adenomatous colon polyps  . ESOPHAGOGASTRODUODENOSCOPY  2010  . INTRAMEDULLARY (IM) NAIL INTERTROCHANTERIC Right 06/13/2016   Procedure: INTRAMEDULLARY (IM) NAIL INTERTROCHANTRIC;  Surgeon: Tania Ade, MD;  Location: [place];  Service: Orthopedics;  Laterality: Right;  . TONSILLECTOMY      Family History  Problem Relation Age of Onset  . Diabetes Mother 74       Deceased  . Hypertension Other   . Colon cancer Neg Hx   . Pancreatic cancer Neg Hx   . Stomach cancer Neg Hx   . Esophageal cancer Neg Hx   .  Rectal cancer Neg Hx     Social History   Tobacco Use  . Smoking status: Never Smoker  . Smokeless tobacco: Never Used  Substance Use Topics  . Alcohol use: Yes    Alcohol/week: 0.0 standard drinks    Comment: 3-4 times per week - vodka 1 drink    Subjective:  Recurrent sensation of chest tightness/ sensation of feeling off balance; has been struggling with these issues for at 2 months; scheduled for stress test later this week- will have a nuclear stress test; patient states that the symptoms only occur once she is up and moving around- does not occur while lying down; has not been able to determine any triggers. No prior history of diagnosed vertigo; Does not that sensation of chest  tightness and dizziness do go together- does not remember having one sensation without the other.  FeNo done at last OV indicated that asthma is well controlled; patient denies any increased concerns for burping or belching- has not seen any change in the Protonix compared to Prilosec; wonders if she can go back on Prilosec due to cost issues. Does have seasonal allergies- takes Claritin daily;      Objective:  Vitals:   06/24/18 1107  BP: 126/72  Pulse: 93  Temp: 97.6 F (36.4 C)  TempSrc: Oral  SpO2: 98%  Weight: 115 lb (52.2 kg)  Height: 5\' 2"  (1.575 m)    General: Well developed, well nourished, in no acute distress  Skin : Warm and dry.  Head: Normocephalic and atraumatic  Eyes: Sclera and conjunctiva clear; pupils round and reactive to light; extraocular movements intact  Ears: External normal; canals clear; tympanic membranes normal  Oropharynx: Pink, supple. No suspicious lesions  Neck: Supple without thyromegaly, adenopathy  Lungs: Respirations unlabored; clear to auscultation bilaterally without wheeze, rales, rhonchi  CVS exam: normal rate and regular rhythm.  Neurologic: Alert and oriented; speech intact; face symmetrical; moves all extremities well; CNII-XII intact without focal deficit   Assessment:  1. Chest pain, unspecified type   2. Other symptoms and signs involving the nervous system   3. Dizziness     Plan:  Repeat EKG in office today- no acute changes seen; normal CXR done in November; suspect anxiety component- ? If due to worrying about health or if anxiety is source of majority of symptoms; she defers medication today; Keep appt for stress test scheduled for Thursday;  Okay to go back on Prilosec for GERD symptoms due to cost issues; Trial of Flonase NS for allergy symptoms; Will update head CT, carotid dopplers- will try to get done on 12/23; Keep planned follow-up with her PCP on 12/26 to review and discuss all test results.  No follow-ups on file.   Orders Placed This Encounter  Procedures  . CT Head Wo Contrast    Standing Status:   Future    Standing Expiration Date:   09/23/2019    Scheduling Instructions:     Please schedule with carotid dopplers; please try to schedule on 12/23; prefers late morning appointments    Order Specific Question:   Preferred imaging location?    Answer:   [facility]    Order Specific Question:   Radiology Contrast Protocol - do NOT remove file path    Answer:   [email or web address]  . US Carotid Duplex Bilateral    Please schedule on 12/23; please schedule with head CT; late morning appointments    Standing Status:   Future    Standing  Expiration Date:   08/26/2019    Order Specific Question:   Reason for exam:    Answer:   dizziness    Order Specific Question:   Preferred imaging location?    Answer:   [facility] [address]  . EKG 12-Lead    Requested Prescriptions   Signed Prescriptions Disp Refills  . fluticasone (FLONASE) 50 MCG/ACT nasal spray 16 g 6    Sig: Place 2 sprays into both nostrils daily.

## 2018-06-25 ENCOUNTER — Telehealth (HOSPITAL_COMMUNITY): Payer: Self-pay

## 2018-06-25 NOTE — Telephone Encounter (Signed)
Pt contacted and detailed instructions given, pt stated that she understood and would be here. S.De [place]aworski EMTP

## 2018-06-26 ENCOUNTER — Ambulatory Visit (HOSPITAL_COMMUNITY): Payer: Medicare Other | Attending: Cardiology

## 2018-06-26 DIAGNOSIS — R079 Chest pain, unspecified: Secondary | ICD-10-CM

## 2018-06-26 LAB — MYOCARDIAL PERFUSION IMAGING
CHL CUP NUCLEAR SRS: 0
LV dias vol: 68 mL (ref 46–106)
LVSYSVOL: 40 mL
NUC STRESS TID: 0.94
Peak HR: 114 {beats}/min
Rest HR: 82 {beats}/min
SDS: 3
SSS: 3

## 2018-06-26 MED ORDER — TECHNETIUM TC 99M TETROFOSMIN IV KIT
10.2000 | PACK | Freq: Once | INTRAVENOUS | Status: AC | PRN
  Administered 2018-06-26: 10.2 via INTRAVENOUS
  Filled 2018-06-26: qty 11

## 2018-06-26 MED ORDER — TECHNETIUM TC 99M TETROFOSMIN IV KIT
32.7000 | PACK | Freq: Once | INTRAVENOUS | Status: AC | PRN
  Administered 2018-06-26: 32.7 via INTRAVENOUS
  Filled 2018-06-26: qty 33

## 2018-06-26 MED ORDER — REGADENOSON 0.4 MG/5ML IV SOLN
0.4000 mg | Freq: Once | INTRAVENOUS | Status: AC
  Administered 2018-06-26: 0.4 mg via INTRAVENOUS

## 2018-06-30 ENCOUNTER — Ambulatory Visit
Admission: RE | Admit: 2018-06-30 | Discharge: 2018-06-30 | Disposition: A | Discharge status: Home / Self Care | Payer: Medicare Other | Source: Ambulatory Visit | Attending: Family | Admitting: Family

## 2018-06-30 DIAGNOSIS — R29818 Other symptoms and signs involving the nervous system: Secondary | ICD-10-CM

## 2018-07-03 ENCOUNTER — Ambulatory Visit (INDEPENDENT_AMBULATORY_CARE_PROVIDER_SITE_OTHER): Payer: Medicare Other | Admitting: Internal Medicine

## 2018-07-03 ENCOUNTER — Encounter: Payer: Self-pay | Admitting: Internal Medicine

## 2018-07-03 DIAGNOSIS — E559 Vitamin D deficiency, unspecified: Secondary | ICD-10-CM

## 2018-07-03 DIAGNOSIS — R5383 Other fatigue: Secondary | ICD-10-CM | POA: Diagnosis not present

## 2018-07-03 DIAGNOSIS — S72001S Fracture of unspecified part of neck of right femur, sequela: Secondary | ICD-10-CM | POA: Diagnosis not present

## 2018-07-03 DIAGNOSIS — R0609 Other forms of dyspnea: Secondary | ICD-10-CM

## 2018-07-03 MED ORDER — CHOLECALCIFEROL 25 MCG (1000 UT) PO TABS: 2000.0000 [IU] | ORAL_TABLET | Freq: Every day | ORAL | 5 refills | Status: DC

## 2018-07-03 NOTE — Assessment & Plan Note (Signed)
Re-start exercisin

## 2018-07-03 NOTE — Progress Notes (Signed)
Subjective:  Patient ID: Cindy Meza, female    DOB: 1936/05/16  Age: 82 y.o. MRN: [id number]  CC: No chief complaint on file.   HPI Cindy Meza presents for CP, dizziness, asthma f/u  Outpatient Medications Prior to Visit  Medication Sig Dispense Refill  . ADVAIR HFA 115-21 MCG/ACT inhaler INHALE 2 PUFFS INTO THE LUNGS TWICE DAILY 12 g 1  . albuterol (PROVENTIL HFA;VENTOLIN HFA) 108 (90 Base) MCG/ACT inhaler Inhale 1-2 puffs into the lungs every 6 (six) hours. 1 Inhaler 1  . aspirin 81 MG tablet Take 81 mg by mouth daily.    . Calcium-Magnesium-Vitamin D (CALCIUM 500 PO) Take 1 each by mouth daily.    . Cholecalciferol 1000 UNITS tablet Take 1,000 Units by mouth daily.      . CVS OMEGA-3 KRILL OIL 500 MG CAPS Take 500 capsules by mouth daily.    . fluticasone (FLONASE) 50 MCG/ACT nasal spray Place 2 sprays into both nostrils daily. 16 g 6  . loratadine (CLARITIN) 10 MG tablet Take 10 mg by mouth daily.      No facility-administered medications prior to visit.     ROS: Review of Systems  Constitutional: Positive for fatigue. Negative for activity change, appetite change, chills and unexpected weight change.  HENT: Negative for congestion, mouth sores and sinus pressure.   Eyes: Negative for visual disturbance.  Respiratory: Negative for cough and chest tightness.   Gastrointestinal: Negative for abdominal pain and nausea.  Genitourinary: Negative for difficulty urinating, frequency and vaginal pain.  Musculoskeletal: Positive for gait problem. Negative for back pain.  Skin: Negative for pallor and rash.  Neurological: Positive for dizziness and light-headedness. Negative for tremors, weakness, numbness and headaches.  Psychiatric/Behavioral: Negative for confusion, sleep disturbance and suicidal ideas. The patient is nervous/anxious.     Objective:  BP 128/70 (BP Location: Left Arm, Patient Position: Sitting, Cuff Size: Normal)   Pulse (!) 45   Temp 98 F (36.7 C)  (Oral)   Ht 5\' 2"  (1.575 m)   Wt 116 lb (52.6 kg)   SpO2 99%   BMI 21.22 kg/m   BP Readings from Last 3 Encounters:  07/03/18 128/70  06/24/18 126/72  06/16/18 130/78    Wt Readings from Last 3 Encounters:  07/03/18 116 lb (52.6 kg)  06/24/18 115 lb (52.2 kg)  06/16/18 116 lb 1.9 oz (52.7 kg)    Physical Exam Constitutional:      General: She is not in acute distress.    Appearance: She is well-developed.  HENT:     Head: Normocephalic.     Right Ear: External ear normal.     Left Ear: External ear normal.     Nose: Nose normal.  Eyes:     General:        Right eye: No discharge.        Left eye: No discharge.     Conjunctiva/sclera: Conjunctivae normal.     Pupils: Pupils are equal, round, and reactive to light.  Neck:     Musculoskeletal: Normal range of motion and neck supple.     Thyroid: No thyromegaly.     Vascular: No JVD.     Trachea: No tracheal deviation.  Cardiovascular:     Rate and Rhythm: Normal rate and regular rhythm.     Heart sounds: Normal heart sounds.  Pulmonary:     Effort: No respiratory distress.     Breath sounds: No stridor. No wheezing.  Abdominal:  General: Bowel sounds are normal. There is no distension.     Palpations: Abdomen is soft. There is no mass.     Tenderness: There is no abdominal tenderness. There is no guarding or rebound.  Musculoskeletal:        General: No tenderness.  Lymphadenopathy:     Cervical: No cervical adenopathy.  Skin:    Findings: No erythema or rash.  Neurological:     Cranial Nerves: No cranial nerve deficit.     Motor: No abnormal muscle tone.     Coordination: Coordination normal.     Deep Tendon Reflexes: Reflexes normal.  Psychiatric:        Behavior: Behavior normal.        Thought Content: Thought content normal.        Judgment: Judgment normal.     Lab Results  Component Value Date   WBC 5.9 05/13/2018   HGB 11.8 (L) 05/13/2018   HCT 37.5 05/13/2018   PLT 295 05/13/2018    GLUCOSE 135 (H) 05/13/2018   CHOL 258 (H) 04/10/2018   TRIG 68.0 04/10/2018   HDL 119.60 04/10/2018   LDLDIRECT 104.0 03/17/2013   LDLCALC 124 (H) 04/10/2018   ALT 11 04/10/2018   AST 20 04/10/2018   NA 135 05/13/2018   K 3.6 05/13/2018   CL 101 05/13/2018   CREATININE 0.90 05/13/2018   BUN 12 05/13/2018   CO2 25 05/13/2018   TSH 1.37 04/10/2018   INR 1.04 06/12/2016   HGBA1C 4.9 04/09/2017    Ct Head Wo Contrast  Result Date: 06/30/2018 CLINICAL DATA:  82 year old female with dizziness and lightheadedness for couple months. No known injury. Initial encounter. EXAM: CT HEAD WITHOUT CONTRAST TECHNIQUE: Contiguous axial images were obtained from the base of the skull through the vertex without intravenous contrast. COMPARISON:  09/30/2015 head CT. FINDINGS: Brain: No intracranial hemorrhage or CT evidence of large acute infarct. Mild chronic microvascular changes. Age-related mild global atrophy. No intracranial mass lesion noted on this unenhanced exam. Vascular: Vascular calcifications.  No acute hyperdense vessel. Skull: No acute abnormality. Sinuses/Orbits: Post lens replacement. No acute orbital abnormality. Visualized paranasal sinuses are clear. Other: Mastoid air cells and middle ear cavity are clear. IMPRESSION: No acute intracranial abnormality noted.  Please see above. Electronically Signed   By: Genia Del M.D.   On: 06/30/2018 15:58    Assessment & Plan:   There are no diagnoses linked to this encounter.   No orders of the defined types were placed in this encounter.    Follow-up: No follow-ups on file.  [name], MD

## 2018-07-03 NOTE — Assessment & Plan Note (Signed)
CL (-) 2019 Go back to senior exercise class

## 2018-07-03 NOTE — Assessment & Plan Note (Signed)
  On Vit D Core strengthening exercises 

## 2018-07-03 NOTE — Assessment & Plan Note (Signed)
Take Tylenol PRN pain before exercise

## 2018-07-15 ENCOUNTER — Ambulatory Visit
Admission: RE | Admit: 2018-07-15 | Discharge: 2018-07-15 | Disposition: A | Discharge status: Home / Self Care | Payer: Medicare Other | Source: Ambulatory Visit | Attending: Family | Admitting: Family

## 2018-07-15 DIAGNOSIS — R42 Dizziness and giddiness: Secondary | ICD-10-CM

## 2018-07-18 ENCOUNTER — Encounter: Payer: Self-pay | Admitting: Cardiology

## 2018-07-18 ENCOUNTER — Ambulatory Visit (INDEPENDENT_AMBULATORY_CARE_PROVIDER_SITE_OTHER): Payer: Medicare Other | Admitting: Cardiology

## 2018-07-18 VITALS — BP 124/70 | HR 87 | Ht 62.0 in | Wt 114.1 lb

## 2018-07-18 DIAGNOSIS — R0789 Other chest pain: Secondary | ICD-10-CM | POA: Diagnosis not present

## 2018-07-18 DIAGNOSIS — I351 Nonrheumatic aortic (valve) insufficiency: Secondary | ICD-10-CM

## 2018-07-18 DIAGNOSIS — I951 Orthostatic hypotension: Secondary | ICD-10-CM

## 2018-07-18 NOTE — Progress Notes (Signed)
Cardiology Office Note:    Date:  07/18/2018   ID:  Cindy Meza, DOB [birth year], MRN [id number]  PCP:  Cassandria Anger, MD  Cardiologist:  Dorris Carnes, MD  Referring MD: Cassandria Anger, MD   Chief Complaint  Patient presents with  . Follow-up    orthostatic hypotension    History of Present Illness:    Cindy Meza is a 83 y.o. female with a past medical history significant for arthritis, GERD, asthma.  Cindy Meza been seen one time in our office by Cindy Meza in 04/2016 with complaints of fatigue and shortness of breath with exertion.She underwent a normal stress Myoview on 04/25/2016 and an echocardiogram on 04/20/2016 showing EF 08-67%, grade 1 diastolic dysfunction, mild AR and moderate MR.  I have been following her in the last few months for evaluation of chest tightness and orthostatic hypotension. She underwent lexiscan myoview on 06/26/18 that was negative for ischemia. EF was 41% on stress test but echo showed EF 50-55%. I educated and her PCP have educated her on measures to treated orthostatic hypotension.   She is here today alone for follow up. She says that her symptoms have gotten "considerably better". She is no longer having chest tightness and her balance and dizziness issues are much better. She has been wearing compression stockings intermittently, says she cannot tolerate them all day. She also has some control top panty hose that she can wear to church. She is better about ensuring enough fluid intake. She is eating salty foods. She pumps her feet prior to standing. She still has to remind herself to slow down.   She had a brain CT that was normal.  Carotid US showed less than 50% RICA stenosis and no stensosis on left.   Home BPs daily 120's-130's. Does not get below 100.  Past Medical History:  Diagnosis Date  . Allergy   . Anemia   . Asthma    Cindy Meza  . Blood transfusion without reported diagnosis   . Breast cyst    Left  .  Cataract    Cindy Meza  . Complication of anesthesia    ' I HAD HALLUCINATIONS WHEN THEY DID MY HIP "  . GERD (gastroesophageal reflux disease)   . Heart murmur   . Hemorrhoids   . History of colonic polyps Cindy Meza   hx of adenomas (small) 1998  . OA (osteoarthritis)   . Osteopenia   . PAC (premature atrial contraction)   . Vitamin D deficiency     Past Surgical History:  Procedure Laterality Date  . ABDOMINAL HYSTERECTOMY     complete  . APPENDECTOMY    . BREAST CYST EXCISION    . CATARACT EXTRACTION, BILATERAL     04/23/18 left eye, 04/02/18 right eye  . COLONOSCOPY  Multiple   Adenomatous colon polyps  . ESOPHAGOGASTRODUODENOSCOPY  2010  . INTRAMEDULLARY (IM) NAIL INTERTROCHANTERIC Right 06/13/2016   Procedure: INTRAMEDULLARY (IM) NAIL INTERTROCHANTRIC;  Surgeon: Cindy Ade, MD;  Location: [facility];  Service: Orthopedics;  Laterality: Right;  . TONSILLECTOMY      Current Medications: Current Meds  Medication Sig  . ADVAIR HFA 115-21 MCG/ACT inhaler INHALE 2 PUFFS INTO THE LUNGS TWICE DAILY  . albuterol (PROVENTIL HFA;VENTOLIN HFA) 108 (90 Base) MCG/ACT inhaler Inhale 1-2 puffs into the lungs every 6 (six) hours.  Marland Kitchen aspirin 81 MG tablet Take 81 mg by mouth daily.  . Calcium-Magnesium-Vitamin D (CALCIUM 500 PO) Take 1 each by mouth daily.  Marland Kitchen  Cholecalciferol 25 MCG (1000 UT) tablet Take 2 tablets (2,000 Units total) by mouth daily.  . CVS OMEGA-3 KRILL OIL 500 MG CAPS Take 500 capsules by mouth daily.  . fluticasone (FLONASE) 50 MCG/ACT nasal spray Place 2 sprays into both nostrils daily.  Marland Kitchen loratadine (CLARITIN) 10 MG tablet Take 10 mg by mouth daily.   Marland Kitchen omeprazole (PRILOSEC) 40 MG capsule Take 40 mg by mouth daily.     Allergies:   Fosamax [alendronate sodium]; Gabapentin; Lidocaine; Risedronate sodium; Shellfish allergy; and Statins   Social History   Socioeconomic History  . Marital status: Widowed    Spouse name: Not on file  . Number of children: Not on  file  . Years of education: Not on file  . Highest education level: Not on file  Occupational History  . Occupation: retired  Scientific laboratory technician  . Financial resource strain: Not on file  . Food insecurity:    Worry: Not on file    Inability: Not on file  . Transportation needs:    Medical: Not on file    Non-medical: Not on file  Tobacco Use  . Smoking status: Never Smoker  . Smokeless tobacco: Never Used  Substance and Sexual Activity  . Alcohol use: Yes    Alcohol/week: 0.0 standard drinks    Comment: 3-4 times per week - vodka 1 drink  . Drug use: No  . Sexual activity: Never  Lifestyle  . Physical activity:    Days per week: Not on file    Minutes per session: Not on file  . Stress: Not on file  Relationships  . Social connections:    Talks on phone: Not on file    Gets together: Not on file    Attends religious service: Not on file    Active member of club or organization: Not on file    Attends meetings of clubs or organizations: Not on file    Relationship status: Not on file  Other Topics Concern  . Not on file  Social History Narrative   She lives alone in a [place] home.  No children.   Retired Education officer, museum.   Highest level of education:  Masters degree     Family History: The patient's family history includes Diabetes (age of onset: 2) in her mother; Hypertension in an other family member. There is no history of Colon cancer, Pancreatic cancer, Stomach cancer, Esophageal cancer, or Rectal cancer. ROS:   Please see the history of present illness.     All other systems reviewed and are negative.  EKGs/Labs/Other Studies Reviewed:    The following studies were reviewed today: As reveiwed in HPI  EKG:  EKG is not ordered today.   Recent Labs: 04/10/2018: ALT 11; TSH 1.37 05/13/2018: BUN 12; Creatinine, Ser 0.90; Hemoglobin 11.8; Platelets 295; Potassium 3.6; Sodium 135   Recent Lipid Panel    Component Value Date/Time   CHOL 258 (H) 04/10/2018 1207     TRIG 68.0 04/10/2018 1207   HDL 119.60 04/10/2018 1207   CHOLHDL 2 04/10/2018 1207   VLDL 13.6 04/10/2018 1207   LDLCALC 124 (H) 04/10/2018 1207   LDLDIRECT 104.0 03/17/2013 1026    Physical Exam:    VS:  BP 124/70   Pulse 87   Ht 5\' 2"  (1.575 m)   Wt 114 lb 1.9 oz (51.8 kg)   SpO2 97%   BMI 20.87 kg/m     Wt Readings from Last 3 Encounters:  07/18/18 114 lb 1.9  oz (51.8 kg)  07/03/18 116 lb (52.6 kg)  06/24/18 115 lb (52.2 kg)     Physical Exam  Constitutional: She is oriented to person, place, and time. She appears well-developed and well-nourished. No distress.  HENT:  Head: Normocephalic and atraumatic.  Neck: Normal range of motion. Neck supple. No JVD present.  Cardiovascular: Normal rate, regular rhythm, normal heart sounds and intact distal pulses. Exam reveals no gallop and no friction rub.  No murmur heard. Pulmonary/Chest: Effort normal and breath sounds normal. No respiratory distress. She has no wheezes. She has no rales.  Abdominal: Soft. Bowel sounds are normal.  Musculoskeletal: Normal range of motion.        General: No deformity or edema.  Neurological: She is alert and oriented to person, place, and time.  Skin: Skin is warm and dry.  Psychiatric: She has a normal mood and affect. Her behavior is normal. Judgment and thought content normal.  Vitals reviewed.   ASSESSMENT:    1. Orthostatic hypotension   2. Chest pressure   3. Aortic valve insufficiency, etiology of cardiac valve disease unspecified    PLAN:    In order of problems listed above:  1. Orthostatic hypotension -Much improved with interventions. Pt praised for her diligence and advised to continue.   2. Chest tightness -No further chest tightness   3. Aortic regurgitation -mild by jet moderate by gradient on echo in 05/2018.  -Repeat echo in 6 months to monitor for progression.    Medication Adjustments/Labs and Tests Ordered: Current medicines are reviewed at length with  the patient today.  Concerns regarding medicines are outlined above. Labs and tests ordered and medication changes are outlined in the patient instructions below:  Patient Instructions  Medication Instructions:  Your physician recommends that you continue on your current medications as directed. Please refer to the Current Medication list given to you today.  If you need a refill on your cardiac medications before your next appointment, please call your pharmacy.   Lab work: None  If you have labs (blood work) drawn today and your tests are completely normal, you will receive your results only by: Marland Kitchen MyChart Message (if you have MyChart) OR . A paper copy in the mail If you have any lab test that is abnormal or we need to change your treatment, we will call you to review the results.  Testing/Procedures: None  Follow-Up: At [facility], you and your health needs are our priority.  As part of our continuing mission to provide you with exceptional heart care, we have created designated Provider Care Teams.  These Care Teams include your primary Cardiologist (physician) and Advanced Practice Providers (APPs -  Physician Assistants and Nurse Practitioners) who all work together to provide you with the care you need, when you need it. You will need a follow up appointment in:  6 months.  Please call our office 2 months in advance to schedule this appointment.  You may see Dorris Carnes, MD or one of the following Advanced Practice Providers on your designated Care Team: Richardson Dopp, PA-C [place], [place] . [name], NP  Any Other Special Instructions Will Be Listed Below (If Applicable).     Signed, [name], NP  07/18/2018 3:59 PM    [facility]

## 2018-07-18 NOTE — Patient Instructions (Signed)
Medication Instructions:Your physician recommends that you continue on your current medications as directed. Please refer to the Current Medication list given to you today.  If you need a refill on your cardiac medications before your next appointment, please call your pharmacy.   Lab work: None   If you have labs (blood work) drawn today and your tests are completely normal, you will receive your results only by: . MyChart Message (if you have MyChart) OR . A paper copy in the mail If you have any lab test that is abnormal or we need to change your treatment, we will call you to review the results.  Testing/Procedures: None   Follow-Up: At [facility], you and your health needs are our priority.  As part of our continuing mission to provide you with exceptional heart care, we have created designated Provider Care Teams.  These Care Teams include your primary Cardiologist (physician) and Advanced Practice Providers (APPs -  Physician Assistants and Nurse Practitioners) who all work together to provide you with the care you need, when you need it. You will need a follow up appointment in:  6 months.  Please call our office 2 months in advance to schedule this appointment.  You may see Paula Ross, MD or one of the following Advanced Practice Providers on your designated Care Team: Scott Weaver, PA-C Vin Bhagat, PA-C . Janine Hammond, NP  Any Other Special Instructions Will Be Listed Below (If Applicable).  

## 2018-09-05 ENCOUNTER — Ambulatory Visit (INDEPENDENT_AMBULATORY_CARE_PROVIDER_SITE_OTHER): Payer: Medicare Other | Admitting: Family

## 2018-09-05 ENCOUNTER — Encounter: Payer: Self-pay | Admitting: Family

## 2018-09-05 VITALS — BP 118/68 | HR 81 | Temp 97.5°F | Ht 62.0 in | Wt 114.0 lb

## 2018-09-05 DIAGNOSIS — J45909 Unspecified asthma, uncomplicated: Secondary | ICD-10-CM

## 2018-09-05 DIAGNOSIS — J019 Acute sinusitis, unspecified: Secondary | ICD-10-CM | POA: Diagnosis not present

## 2018-09-05 MED ORDER — AZITHROMYCIN 250 MG PO TABS: ORAL_TABLET | ORAL | 0 refills | Status: DC

## 2018-09-05 NOTE — Progress Notes (Signed)
Cindy Meza is a 83 y.o. female with the following history as recorded in EpicCare:  Patient Active Problem List   Diagnosis Date Noted  . Aortic regurgitation 06/16/2018  . Fall 06/12/2016  . Closed right hip fracture ([place]) 06/12/2016  . Cerumen impaction 04/06/2016  . Fatigue 02/15/2016  . DOE (dyspnea on exertion) 02/15/2016  . Headache 09/27/2015  . Coronary atherosclerosis 06/29/2015  . Urgency of urination 05/17/2015  . Paresthesia of arm 09/21/2014  . Breast pain, left 09/21/2014  . Chest pressure 07/22/2012  . Lightheadedness 07/17/2012  . Orthostatic hypotension 07/17/2012  . Hot flash, menopausal 02/15/2012  . Allergic rhinitis 09/12/2011  . Well adult exam 02/14/2011  . HEMORRHOIDS, WITH BLEEDING 10/05/2009  . Vitamin D deficiency 06/17/2007  . Asthma 06/17/2007  . GERD (gastroesophageal reflux disease) 06/17/2007  . OSTEOARTHRITIS 06/17/2007  . Osteoporosis 06/17/2007  . COLONIC POLYPS, HX OF 06/17/2007    Current Outpatient Medications  Medication Sig Dispense Refill  . ADVAIR HFA 115-21 MCG/ACT inhaler INHALE 2 PUFFS INTO THE LUNGS TWICE DAILY 12 g 1  . albuterol (PROVENTIL HFA;VENTOLIN HFA) 108 (90 Base) MCG/ACT inhaler Inhale 1-2 puffs into the lungs every 6 (six) hours. 1 Inhaler 1  . aspirin 81 MG tablet Take 81 mg by mouth daily.    . Calcium-Magnesium-Vitamin D (CALCIUM 500 PO) Take 1 each by mouth daily.    . Cholecalciferol 25 MCG (1000 UT) tablet Take 2 tablets (2,000 Units total) by mouth daily. 100 tablet 5  . CVS OMEGA-3 KRILL OIL 500 MG CAPS Take 500 capsules by mouth daily.    . fluticasone (FLONASE) 50 MCG/ACT nasal spray Place 2 sprays into both nostrils daily. 16 g 6  . loratadine (CLARITIN) 10 MG tablet Take 10 mg by mouth daily.     Marland Kitchen omeprazole (PRILOSEC) 40 MG capsule Take 40 mg by mouth daily.    Marland Kitchen azithromycin (ZITHROMAX) 250 MG tablet 2 tabs po qd x 1 day; 1 tablet per day x 4 days; 6 tablet 0   No current facility-administered  medications for this visit.     Allergies: Fosamax [alendronate sodium]; Gabapentin; Lidocaine; Risedronate sodium; Shellfish allergy; and Statins  Past Medical History:  Diagnosis Date  . Allergy   . Anemia   . Asthma    Dr. Melvyn Novas  . Blood transfusion without reported diagnosis   . Breast cyst    Left  . Cataract    Dr. Gershon Crane  . Complication of anesthesia    ' I HAD HALLUCINATIONS WHEN THEY DID MY HIP "  . GERD (gastroesophageal reflux disease)   . Heart murmur   . Hemorrhoids   . History of colonic polyps [name]   hx of adenomas (small) 1998  . OA (osteoarthritis)   . Osteopenia   . PAC (premature atrial contraction)   . Vitamin D deficiency     Past Surgical History:  Procedure Laterality Date  . ABDOMINAL HYSTERECTOMY     complete  . APPENDECTOMY    . BREAST CYST EXCISION    . CATARACT EXTRACTION, BILATERAL     04/23/18 left eye, 04/02/18 right eye  . COLONOSCOPY  Multiple   Adenomatous colon polyps  . ESOPHAGOGASTRODUODENOSCOPY  2010  . INTRAMEDULLARY (IM) NAIL INTERTROCHANTERIC Right 06/13/2016   Procedure: INTRAMEDULLARY (IM) NAIL INTERTROCHANTRIC;  Surgeon: Tania Ade, MD;  Location: [facility];  Service: Orthopedics;  Laterality: Right;  . TONSILLECTOMY      Family History  Problem Relation Age of Onset  . Diabetes  Mother 36       Deceased  . Hypertension Other   . Colon cancer Neg Hx   . Pancreatic cancer Neg Hx   . Stomach cancer Neg Hx   . Esophageal cancer Neg Hx   . Rectal cancer Neg Hx     Social History   Tobacco Use  . Smoking status: Never Smoker  . Smokeless tobacco: Never Used  Substance Use Topics  . Alcohol use: Yes    Alcohol/week: 0.0 standard drinks    Comment: 3-4 times per week - vodka 1 drink    Subjective:  Patient presents with concerns for dry cough x 1 week; + scratchy throat, congestion; has felt "a little light headed" in the past few days; has re-started her Flonase/ takes Claritin daily; sleeping okay; no fever, no  chest pain, no shortness of breath, no wheezing but has felt tight- used her Advair more this past week;     Objective:  Vitals:   09/05/18 0945  BP: 118/68  Pulse: 81  Temp: (!) 97.5 F (36.4 C)  TempSrc: Oral  SpO2: 98%  Weight: 114 lb 0.6 oz (51.7 kg)  Height: 5\' 2"  (1.575 m)    General: Well developed, well nourished, in no acute distress  Skin : Warm and dry.  Head: Normocephalic and atraumatic  Eyes: Sclera and conjunctiva clear; pupils round and reactive to light; extraocular movements intact  Ears: External normal; canals clear; tympanic membranes normal  Oropharynx: Pink, supple. No suspicious lesions  Neck: Supple without thyromegaly, adenopathy  Lungs: Respirations unlabored; clear to auscultation bilaterally without wheeze, rales, rhonchi  CVS exam: normal rate and regular rhythm.  Neurologic: Alert and oriented; speech intact; face symmetrical; moves all extremities well; CNII-XII intact without focal deficit   Assessment:  1. Acute asthmatic bronchitis   2. Acute sinusitis, recurrence not specified, unspecified location     Plan:  Rx for Z-pak #1 take as directed; continue Advair and albuterol; increase fluids, rest and follow-up worse, no better;  Agree that can cancel upcoming appointment with his PCP for April- has been seen multiple times since October with normal testing/ results; plan for AWV in October 2020.   Return for AWV with Dr. Caryn Section after 04/11/2019; cancel April.  No orders of the defined types were placed in this encounter.   Requested Prescriptions   Signed Prescriptions Disp Refills  . azithromycin (ZITHROMAX) 250 MG tablet 6 tablet 0    Sig: 2 tabs po qd x 1 day; 1 tablet per day x 4 days;   ]

## 2018-09-19 ENCOUNTER — Telehealth: Payer: Self-pay | Admitting: Family

## 2018-09-19 NOTE — Telephone Encounter (Signed)
Please advise 

## 2018-09-19 NOTE — Telephone Encounter (Signed)
Copied from [place] [id number]. Topic: Quick Communication - See Telephone Encounter >> Sep 19, 2018  2:06 PM Margot Ables wrote: CRM for notification. See Telephone encounter for: 09/19/18. Pt still having congestion, minimal coughing, and still intermittent mild lightheadedness. Pt reports she had this symptoms at [place] 09/05/2018. They are improved but lingering. She is asking for advice on what to do next. Please advise.  Ok to call on home # or cell #.

## 2018-09-20 NOTE — Telephone Encounter (Signed)
Use Claritin (loratadine) 10 mg daily for allergies. Use nasal rinse with normal saline.  Can use normal saline spray.  Continue to use Flonase. Thank you

## 2018-09-23 ENCOUNTER — Encounter: Payer: Self-pay | Admitting: Internal Medicine

## 2018-09-23 ENCOUNTER — Other Ambulatory Visit (INDEPENDENT_AMBULATORY_CARE_PROVIDER_SITE_OTHER): Payer: Medicare Other

## 2018-09-23 ENCOUNTER — Ambulatory Visit (INDEPENDENT_AMBULATORY_CARE_PROVIDER_SITE_OTHER): Payer: Medicare Other | Admitting: Internal Medicine

## 2018-09-23 ENCOUNTER — Other Ambulatory Visit: Payer: Self-pay

## 2018-09-23 DIAGNOSIS — I951 Orthostatic hypotension: Secondary | ICD-10-CM

## 2018-09-23 DIAGNOSIS — K219 Gastro-esophageal reflux disease without esophagitis: Secondary | ICD-10-CM | POA: Diagnosis not present

## 2018-09-23 DIAGNOSIS — J4520 Mild intermittent asthma, uncomplicated: Secondary | ICD-10-CM

## 2018-09-23 DIAGNOSIS — I2510 Atherosclerotic heart disease of native coronary artery without angina pectoris: Secondary | ICD-10-CM | POA: Diagnosis not present

## 2018-09-23 LAB — CBC WITH DIFFERENTIAL/PLATELET
Basophils Absolute: 0.1 10*3/uL (ref 0.0–0.1)
Basophils Relative: 1 % (ref 0.0–3.0)
Eosinophils Absolute: 0.1 10*3/uL (ref 0.0–0.7)
Eosinophils Relative: 1.7 % (ref 0.0–5.0)
HCT: 35.3 % — ABNORMAL LOW (ref 36.0–46.0)
Hemoglobin: 11.8 g/dL — ABNORMAL LOW (ref 12.0–15.0)
LYMPHS ABS: 2.2 10*3/uL (ref 0.7–4.0)
Lymphocytes Relative: 38.5 % (ref 12.0–46.0)
MCHC: 33.3 g/dL (ref 30.0–36.0)
MCV: 93.2 fl (ref 78.0–100.0)
MONOS PCT: 7.8 % (ref 3.0–12.0)
Monocytes Absolute: 0.4 10*3/uL (ref 0.1–1.0)
Neutro Abs: 2.9 10*3/uL (ref 1.4–7.7)
Neutrophils Relative %: 51 % (ref 43.0–77.0)
Platelets: 276 10*3/uL (ref 150.0–400.0)
RBC: 3.79 Mil/uL — AB (ref 3.87–5.11)
RDW: 12.5 % (ref 11.5–15.5)
WBC: 5.7 10*3/uL (ref 4.0–10.5)

## 2018-09-23 LAB — BASIC METABOLIC PANEL
BUN: 12 mg/dL (ref 6–23)
CO2: 29 mEq/L (ref 19–32)
Calcium: 9.3 mg/dL (ref 8.4–10.5)
Chloride: 103 mEq/L (ref 96–112)
Creatinine, Ser: 0.79 mg/dL (ref 0.40–1.20)
GFR: 84.59 mL/min (ref >60.00)
Glucose, Bld: 116 mg/dL — ABNORMAL HIGH (ref 70–99)
Potassium: 3.6 mEq/L (ref 3.5–5.1)
Sodium: 138 mEq/L (ref 135–145)

## 2018-09-23 NOTE — Assessment & Plan Note (Signed)
Prilosec 

## 2018-09-23 NOTE — Assessment & Plan Note (Signed)
ASA, krill oil

## 2018-09-23 NOTE — Patient Instructions (Addendum)
Use [employer] Peroxicare tooth paste Hydrate yourself well

## 2018-09-23 NOTE — Assessment & Plan Note (Signed)
Hydrate yourself well 

## 2018-09-23 NOTE — Progress Notes (Signed)
Subjective:  Patient ID: Cindy Meza, female    DOB: June 18, 1936  Age: 83 y.o. MRN: [id number]  CC: No chief complaint on file.   HPI Cindy Meza presents for bronchitis - saw laura on 2/28 and given a Zpack  Outpatient Medications Prior to Visit  Medication Sig Dispense Refill  . ADVAIR HFA 115-21 MCG/ACT inhaler INHALE 2 PUFFS INTO THE LUNGS TWICE DAILY 12 g 1  . albuterol (PROVENTIL HFA;VENTOLIN HFA) 108 (90 Base) MCG/ACT inhaler Inhale 1-2 puffs into the lungs every 6 (six) hours. 1 Inhaler 1  . aspirin 81 MG tablet Take 81 mg by mouth daily.    . Calcium-Magnesium-Vitamin D (CALCIUM 500 PO) Take 1 each by mouth daily.    . Cholecalciferol 25 MCG (1000 UT) tablet Take 2 tablets (2,000 Units total) by mouth daily. 100 tablet 5  . CVS OMEGA-3 KRILL OIL 500 MG CAPS Take 500 capsules by mouth daily.    . fluticasone (FLONASE) 50 MCG/ACT nasal spray Place 2 sprays into both nostrils daily. 16 g 6  . loratadine (CLARITIN) 10 MG tablet Take 10 mg by mouth daily.     Marland Kitchen omeprazole (PRILOSEC) 40 MG capsule Take 40 mg by mouth daily.     No facility-administered medications prior to visit.     ROS: Review of Systems  Constitutional: Negative for activity change, appetite change, chills, fatigue and unexpected weight change.  HENT: Negative for congestion, mouth sores and sinus pressure.   Eyes: Negative for visual disturbance.  Respiratory: Positive for cough. Negative for chest tightness, shortness of breath and wheezing.   Gastrointestinal: Negative for abdominal pain and nausea.  Genitourinary: Negative for difficulty urinating, frequency and vaginal pain.  Musculoskeletal: Negative for back pain and gait problem.  Skin: Negative for pallor and rash.  Neurological: Negative for dizziness, tremors, weakness, numbness and headaches.  Psychiatric/Behavioral: Negative for confusion and sleep disturbance.    Objective:  BP 124/72 (BP Location: Left Arm, Patient Position:  Sitting, Cuff Size: Normal)   Pulse 82   Temp (!) 97.5 F (36.4 C) (Oral)   Ht 5\' 2"  (1.575 m)   Wt 113 lb (51.3 kg)   SpO2 99%   BMI 20.67 kg/m   BP Readings from Last 3 Encounters:  09/23/18 124/72  09/05/18 118/68  07/18/18 124/70    Wt Readings from Last 3 Encounters:  09/23/18 113 lb (51.3 kg)  09/05/18 114 lb 0.6 oz (51.7 kg)  07/18/18 114 lb 1.9 oz (51.8 kg)    Physical Exam Constitutional:      General: She is not in acute distress.    Appearance: She is well-developed.  HENT:     Head: Normocephalic.     Right Ear: External ear normal.     Left Ear: External ear normal.     Nose: Nose normal.  Eyes:     General:        Right eye: No discharge.        Left eye: No discharge.     Conjunctiva/sclera: Conjunctivae normal.     Pupils: Pupils are equal, round, and reactive to light.  Neck:     Musculoskeletal: Normal range of motion and neck supple.     Thyroid: No thyromegaly.     Vascular: No JVD.     Trachea: No tracheal deviation.  Cardiovascular:     Rate and Rhythm: Normal rate and regular rhythm.     Heart sounds: Normal heart sounds.  Pulmonary:  Effort: No respiratory distress.     Breath sounds: No stridor. No wheezing.  Abdominal:     General: Bowel sounds are normal. There is no distension.     Palpations: Abdomen is soft. There is no mass.     Tenderness: There is no abdominal tenderness. There is no guarding or rebound.  Musculoskeletal:        General: No tenderness.  Lymphadenopathy:     Cervical: No cervical adenopathy.  Skin:    Findings: No erythema or rash.  Neurological:     Cranial Nerves: No cranial nerve deficit.     Motor: No abnormal muscle tone.     Coordination: Coordination normal.     Deep Tendon Reflexes: Reflexes normal.  Psychiatric:        Behavior: Behavior normal.        Thought Content: Thought content normal.        Judgment: Judgment normal.   no thrush  Lab Results  Component Value Date   WBC 5.9  05/13/2018   HGB 11.8 (L) 05/13/2018   HCT 37.5 05/13/2018   PLT 295 05/13/2018   GLUCOSE 135 (H) 05/13/2018   CHOL 258 (H) 04/10/2018   TRIG 68.0 04/10/2018   HDL 119.60 04/10/2018   LDLDIRECT 104.0 03/17/2013   LDLCALC 124 (H) 04/10/2018   ALT 11 04/10/2018   AST 20 04/10/2018   NA 135 05/13/2018   K 3.6 05/13/2018   CL 101 05/13/2018   CREATININE 0.90 05/13/2018   BUN 12 05/13/2018   CO2 25 05/13/2018   TSH 1.37 04/10/2018   INR 1.04 06/12/2016   HGBA1C 4.9 04/09/2017    US Carotid Duplex Bilateral  Result Date: 07/15/2018 CLINICAL DATA:  Dizziness EXAM: BILATERAL CAROTID DUPLEX ULTRASOUND TECHNIQUE: Pearline Cables scale imaging, color Doppler and duplex ultrasound were performed of bilateral carotid and vertebral arteries in the neck. COMPARISON:  None. FINDINGS: Criteria: Quantification of carotid stenosis is based on velocity parameters that correlate the residual internal carotid diameter with NASCET-based stenosis levels, using the diameter of the distal internal carotid lumen as the denominator for stenosis measurement. The following velocity measurements were obtained: RIGHT ICA: 77/23 cm/sec CCA: 13/08 cm/sec SYSTOLIC ICA/CCA RATIO:  1.0 ECA: 62 cm/sec LEFT ICA: 110/32 cm/sec CCA: 65/78 cm/sec SYSTOLIC ICA/CCA RATIO:  1.4 ECA: 37 cm/sec RIGHT CAROTID ARTERY: Eccentric partially calcified plaque in the bulb involving the ECA origin. No high-grade stenosis. Normal waveforms and color Doppler signal. Mildly tortuous ICA. RIGHT VERTEBRAL ARTERY:  Normal flow direction and waveform. LEFT CAROTID ARTERY: Intimal thickening in the bulb. No significant stenosis. Normal waveforms and color Doppler signal. Tortuous ICA. LEFT VERTEBRAL ARTERY:  Normal flow direction and waveform. Upper extremity blood pressures: RIGHT: 138/73 LEFT: 145/74 IMPRESSION: 1. Right carotid bifurcation plaque resulting in less than 50% diameter stenosis. 2.  Antegrade bilateral vertebral arterial flow. Electronically Signed    By: Lucrezia Europe M.D.   On: 07/15/2018 15:04    Assessment & Plan:   There are no diagnoses linked to this encounter.   No orders of the defined types were placed in this encounter.    Follow-up: No follow-ups on file.  [name], MD

## 2018-09-23 NOTE — Assessment & Plan Note (Signed)
Advair MDI - use qd Use [employer] Peroxicare tooth paste

## 2018-09-23 NOTE — Telephone Encounter (Signed)
Pt has appt today, showing signs of thrush, dry cough, neck pain

## 2018-10-14 ENCOUNTER — Ambulatory Visit: Payer: Medicare Other | Admitting: Internal Medicine

## 2018-12-02 ENCOUNTER — Other Ambulatory Visit: Payer: Self-pay | Admitting: Internal Medicine

## 2018-12-24 ENCOUNTER — Encounter: Payer: Self-pay | Admitting: Internal Medicine

## 2018-12-24 ENCOUNTER — Ambulatory Visit (INDEPENDENT_AMBULATORY_CARE_PROVIDER_SITE_OTHER): Payer: Medicare Other | Admitting: Internal Medicine

## 2018-12-24 ENCOUNTER — Other Ambulatory Visit: Payer: Self-pay

## 2018-12-24 DIAGNOSIS — J302 Other seasonal allergic rhinitis: Secondary | ICD-10-CM | POA: Diagnosis not present

## 2018-12-24 DIAGNOSIS — K219 Gastro-esophageal reflux disease without esophagitis: Secondary | ICD-10-CM | POA: Diagnosis not present

## 2018-12-24 DIAGNOSIS — M8000XS Age-related osteoporosis with current pathological fracture, unspecified site, sequela: Secondary | ICD-10-CM

## 2018-12-24 DIAGNOSIS — E559 Vitamin D deficiency, unspecified: Secondary | ICD-10-CM

## 2018-12-24 DIAGNOSIS — J4520 Mild intermittent asthma, uncomplicated: Secondary | ICD-10-CM

## 2018-12-24 MED ORDER — OMEPRAZOLE 40 MG PO CPDR: DELAYED_RELEASE_CAPSULE | ORAL | 3 refills | Status: DC

## 2018-12-24 NOTE — Assessment & Plan Note (Signed)
Prolia suggested

## 2018-12-24 NOTE — Assessment & Plan Note (Signed)
Flonase Loratidine

## 2018-12-24 NOTE — Assessment & Plan Note (Signed)
On Vit D Core strengthening exercises

## 2018-12-24 NOTE — Assessment & Plan Note (Signed)
Advair 

## 2018-12-24 NOTE — Assessment & Plan Note (Signed)
Prilosec 

## 2018-12-24 NOTE — Progress Notes (Addendum)
Subjective:  Patient ID: Cindy Meza, female    DOB: Nov 22, 1935  Age: 83 y.o. MRN: [id number]  CC: No chief complaint on file.   HPI Cindy Meza presents for asthma, GERD, osteoporosis f/u  Outpatient Medications Prior to Visit  Medication Sig Dispense Refill  . ADVAIR HFA 115-21 MCG/ACT inhaler INHALE 2 PUFFS INTO THE LUNGS TWICE DAILY 12 g 1  . albuterol (PROVENTIL HFA;VENTOLIN HFA) 108 (90 Base) MCG/ACT inhaler Inhale 1-2 puffs into the lungs every 6 (six) hours. 1 Inhaler 1  . aspirin 81 MG tablet Take 81 mg by mouth daily.    . Calcium-Magnesium-Vitamin D (CALCIUM 500 PO) Take 1 each by mouth daily.    . Cholecalciferol 25 MCG (1000 UT) tablet Take 2 tablets (2,000 Units total) by mouth daily. 100 tablet 5  . CVS OMEGA-3 KRILL OIL 500 MG CAPS Take 500 capsules by mouth daily.    . fluticasone (FLONASE) 50 MCG/ACT nasal spray Place 2 sprays into both nostrils daily. 16 g 6  . loratadine (CLARITIN) 10 MG tablet Take 10 mg by mouth daily.     Marland Kitchen omeprazole (PRILOSEC) 40 MG capsule TAKE 1 CAPSULE(40 MG) BY MOUTH DAILY 90 capsule 0   No facility-administered medications prior to visit.     ROS: Review of Systems  Constitutional: Negative for activity change, appetite change, chills, fatigue and unexpected weight change.  HENT: Negative for congestion, mouth sores and sinus pressure.   Eyes: Negative for visual disturbance.  Respiratory: Negative for cough and chest tightness.   Gastrointestinal: Negative for abdominal pain and nausea.  Genitourinary: Negative for difficulty urinating, frequency and vaginal pain.  Musculoskeletal: Negative for back pain and gait problem.  Skin: Negative for pallor and rash.  Neurological: Negative for dizziness, tremors, weakness, numbness and headaches.  Psychiatric/Behavioral: Negative for confusion, sleep disturbance and suicidal ideas.    Objective:  BP 122/74 (BP Location: Left Arm, Patient Position: Sitting, Cuff Size: Normal)    Pulse 88   Temp (!) 97.5 F (36.4 C) (Oral)   Ht 5\' 2"  (1.575 m)   Wt 115 lb (52.2 kg)   SpO2 98%   BMI 21.03 kg/m   BP Readings from Last 3 Encounters:  12/24/18 122/74  09/23/18 124/72  09/05/18 118/68    Wt Readings from Last 3 Encounters:  12/24/18 115 lb (52.2 kg)  09/23/18 113 lb (51.3 kg)  09/05/18 114 lb 0.6 oz (51.7 kg)    Physical Exam Constitutional:      General: She is not in acute distress.    Appearance: She is well-developed.  HENT:     Head: Normocephalic.     Right Ear: External ear normal.     Left Ear: External ear normal.     Nose: Nose normal.  Eyes:     General:        Right eye: No discharge.        Left eye: No discharge.     Conjunctiva/sclera: Conjunctivae normal.     Pupils: Pupils are equal, round, and reactive to light.  Neck:     Musculoskeletal: Normal range of motion and neck supple.     Thyroid: No thyromegaly.     Vascular: No JVD.     Trachea: No tracheal deviation.  Cardiovascular:     Rate and Rhythm: Normal rate and regular rhythm.     Heart sounds: Normal heart sounds.  Pulmonary:     Effort: No respiratory distress.     Breath  sounds: No stridor. No wheezing.  Abdominal:     General: Bowel sounds are normal. There is no distension.     Palpations: Abdomen is soft. There is no mass.     Tenderness: There is no abdominal tenderness. There is no guarding or rebound.  Musculoskeletal:        General: No tenderness.  Lymphadenopathy:     Cervical: No cervical adenopathy.  Skin:    Findings: No erythema or rash.  Neurological:     Cranial Nerves: No cranial nerve deficit.     Motor: No abnormal muscle tone.     Coordination: Coordination normal.     Deep Tendon Reflexes: Reflexes normal.  Psychiatric:        Behavior: Behavior normal.        Thought Content: Thought content normal.        Judgment: Judgment normal.    Thin A/o/c  Lab Results  Component Value Date   WBC 5.7 09/23/2018   HGB 11.8 (L) 09/23/2018    HCT 35.3 (L) 09/23/2018   PLT 276.0 09/23/2018   GLUCOSE 116 (H) 09/23/2018   CHOL 258 (H) 04/10/2018   TRIG 68.0 04/10/2018   HDL 119.60 04/10/2018   LDLDIRECT 104.0 03/17/2013   LDLCALC 124 (H) 04/10/2018   ALT 11 04/10/2018   AST 20 04/10/2018   NA 138 09/23/2018   K 3.6 09/23/2018   CL 103 09/23/2018   CREATININE 0.79 09/23/2018   BUN 12 09/23/2018   CO2 29 09/23/2018   TSH 1.37 04/10/2018   INR 1.04 06/12/2016   HGBA1C 4.9 04/09/2017    US Carotid Duplex Bilateral  Result Date: 07/15/2018 CLINICAL DATA:  Dizziness EXAM: BILATERAL CAROTID DUPLEX ULTRASOUND TECHNIQUE: Pearline Cables scale imaging, color Doppler and duplex ultrasound were performed of bilateral carotid and vertebral arteries in the neck. COMPARISON:  None. FINDINGS: Criteria: Quantification of carotid stenosis is based on velocity parameters that correlate the residual internal carotid diameter with NASCET-based stenosis levels, using the diameter of the distal internal carotid lumen as the denominator for stenosis measurement. The following velocity measurements were obtained: RIGHT ICA: 77/23 cm/sec CCA: 85/63 cm/sec SYSTOLIC ICA/CCA RATIO:  1.0 ECA: 62 cm/sec LEFT ICA: 110/32 cm/sec CCA: 14/97 cm/sec SYSTOLIC ICA/CCA RATIO:  1.4 ECA: 37 cm/sec RIGHT CAROTID ARTERY: Eccentric partially calcified plaque in the bulb involving the ECA origin. No high-grade stenosis. Normal waveforms and color Doppler signal. Mildly tortuous ICA. RIGHT VERTEBRAL ARTERY:  Normal flow direction and waveform. LEFT CAROTID ARTERY: Intimal thickening in the bulb. No significant stenosis. Normal waveforms and color Doppler signal. Tortuous ICA. LEFT VERTEBRAL ARTERY:  Normal flow direction and waveform. Upper extremity blood pressures: RIGHT: 138/73 LEFT: 145/74 IMPRESSION: 1. Right carotid bifurcation plaque resulting in less than 50% diameter stenosis. 2.  Antegrade bilateral vertebral arterial flow. Electronically Signed   By: Lucrezia Europe M.D.   On:  07/15/2018 15:04    Assessment & Plan:   There are no diagnoses linked to this encounter.   No orders of the defined types were placed in this encounter.    Follow-up: No follow-ups on file.  [name], MD

## 2019-01-14 ENCOUNTER — Telehealth: Payer: Self-pay | Admitting: Cardiology

## 2019-01-14 NOTE — Telephone Encounter (Signed)

## 2019-01-15 ENCOUNTER — Other Ambulatory Visit: Payer: Self-pay

## 2019-01-15 ENCOUNTER — Encounter: Payer: Self-pay | Admitting: Cardiology

## 2019-01-15 ENCOUNTER — Ambulatory Visit (INDEPENDENT_AMBULATORY_CARE_PROVIDER_SITE_OTHER): Payer: Medicare Other | Admitting: Cardiology

## 2019-01-15 VITALS — BP 120/80 | HR 85 | Ht 62.0 in | Wt 113.0 lb

## 2019-01-15 DIAGNOSIS — I351 Nonrheumatic aortic (valve) insufficiency: Secondary | ICD-10-CM

## 2019-01-15 DIAGNOSIS — I951 Orthostatic hypotension: Secondary | ICD-10-CM

## 2019-01-15 DIAGNOSIS — I340 Nonrheumatic mitral (valve) insufficiency: Secondary | ICD-10-CM

## 2019-01-15 NOTE — Progress Notes (Signed)
Cardiology Office Note:    Date:  01/15/2019   ID:  Cindy Meza, DOB 07-19-1935, MRN [id number]  PCP:  Cindy Anger, MD  Cardiologist:  Cindy Carnes, MD  Referring MD: Cindy Anger, MD   Chief Complaint  Patient presents with  . Follow-up    dizziness    History of Present Illness:    Cindy Meza is a 83 y.o. female with a past medical history significant for arthritis, GERD, asthma, mild AR, mod MR and orthostatic hypotension.   [name] been seen one time in our office by Cindy Meza in 04/2016 with complaints of fatigue and shortness of breath with exertion.She underwent a normal stress Myoview on 04/25/2016 and an echocardiogram on 04/20/2016 showing EF 26-71%, grade 1 diastolic dysfunction, mild AR and moderate MR. A cardiac monitor showed only occ PACs.   I saw her last year for evaluation of chest tightness and orthostatic hypotension. She underwent lexiscan myoview on 06/26/18 that was negative for ischemia. EF was 41% on stress test but echo showed EF 50-55%. I educated and her PCP have educated her on measures to treated orthostatic hypotension. I last saw her on 07/18/2018 and she was doing much better.   Today she is here alone and reports doing well. She only has rare lightheadedness if she gets up too fast. She has to concentrate to slow down. She works on getting enough fluid intake. Does not wear girdle or compression stockings any more. She is doing church by phone now with COVID 19. She is not doing any exercise. She works around the house and feels like she is fairly active.   She denies chest pain/pressure, shortness of breath, orthpnea, PND, edema, palpitations, syncope.   Home BP- usually in 110's/70's. Sometimes gets below 100, rare, she eats extra salt.   Past Medical History:  Diagnosis Date  . Allergy   . Anemia   . Asthma    Cindy Meza  . Blood transfusion without reported diagnosis   . Breast cyst    Left  . Cataract    Dr.  Gershon Meza  . Complication of anesthesia    ' I HAD HALLUCINATIONS WHEN THEY DID MY HIP "  . GERD (gastroesophageal reflux disease)   . Heart murmur   . Hemorrhoids   . History of colonic polyps Cindy Meza   hx of adenomas (small) 1998  . OA (osteoarthritis)   . Osteopenia   . PAC (premature atrial contraction)   . Vitamin D deficiency     Past Surgical History:  Procedure Laterality Date  . ABDOMINAL HYSTERECTOMY     complete  . APPENDECTOMY    . BREAST CYST EXCISION    . CATARACT EXTRACTION, BILATERAL     04/23/18 left eye, 04/02/18 right eye  . COLONOSCOPY  Multiple   Adenomatous colon polyps  . ESOPHAGOGASTRODUODENOSCOPY  2010  . INTRAMEDULLARY (IM) NAIL INTERTROCHANTERIC Right 06/13/2016   Procedure: INTRAMEDULLARY (IM) NAIL INTERTROCHANTRIC;  Surgeon: Cindy Ade, MD;  Location: [facility];  Service: Orthopedics;  Laterality: Right;  . TONSILLECTOMY      Current Medications: Current Meds  Medication Sig  . ADVAIR HFA 115-21 MCG/ACT inhaler INHALE 2 PUFFS INTO THE LUNGS TWICE DAILY  . albuterol (PROVENTIL HFA;VENTOLIN HFA) 108 (90 Base) MCG/ACT inhaler Inhale 1-2 puffs into the lungs every 6 (six) hours.  Marland Kitchen aspirin 81 MG tablet Take 81 mg by mouth daily.  . Calcium-Magnesium-Vitamin D (CALCIUM 500 PO) Take 1 each by mouth daily.  Marland Kitchen  Cholecalciferol 25 MCG (1000 UT) tablet Take 2 tablets (2,000 Units total) by mouth daily.  . CVS OMEGA-3 KRILL OIL 500 MG CAPS Take 500 capsules by mouth daily.  . fluticasone (FLONASE) 50 MCG/ACT nasal spray Place 2 sprays into both nostrils daily.  Marland Kitchen loratadine (CLARITIN) 10 MG tablet Take 10 mg by mouth daily.   Marland Kitchen omeprazole (PRILOSEC) 40 MG capsule TAKE 1 CAPSULE(40 MG) BY MOUTH DAILY     Allergies:   Fosamax [alendronate sodium], Gabapentin, Lidocaine, Risedronate sodium, Shellfish allergy, and Statins   Social History   Socioeconomic History  . Marital status: Widowed    Spouse name: Not on file  . Number of children: Not on file  .  Years of education: Not on file  . Highest education level: Not on file  Occupational History  . Occupation: retired  Scientific laboratory technician  . Financial resource strain: Not on file  . Food insecurity    Worry: Not on file    Inability: Not on file  . Transportation needs    Medical: Not on file    Non-medical: Not on file  Tobacco Use  . Smoking status: Never Smoker  . Smokeless tobacco: Never Used  Substance and Sexual Activity  . Alcohol use: Yes    Alcohol/week: 0.0 standard drinks    Comment: 3-4 times per week - vodka 1 drink  . Drug use: No  . Sexual activity: Never  Lifestyle  . Physical activity    Days per week: Not on file    Minutes per session: Not on file  . Stress: Not on file  Relationships  . Social Herbalist on phone: Not on file    Gets together: Not on file    Attends religious service: Not on file    Active member of club or organization: Not on file    Attends meetings of clubs or organizations: Not on file    Relationship status: Not on file  Other Topics Concern  . Not on file  Social History Narrative   She lives alone in a [place] home.  No children.   Retired Education officer, museum.   Highest level of education:  Masters degree     Family History: The patient's family history includes Diabetes (age of onset: 70) in her mother; Hypertension in an other family member. There is no history of Colon cancer, Pancreatic cancer, Stomach cancer, Esophageal cancer, or Rectal cancer. ROS:   Please see the history of present illness.     All other systems reviewed and are negative.  EKGs/Labs/Other Studies Reviewed:    The following studies were reviewed today:   Echo 05/20/2018 Study Conclusions  - Left ventricle: The cavity size was normal. Systolic function was   normal. The estimated ejection fraction was in the range of 50%   to 55%. Wall motion was grossly normal, though frequent ectopy   limits sensitivity. In some views, septum appears  mildly   hypokinetic. The study is not technically sufficient to allow   evaluation of LV diastolic function. - Aortic valve: There was mild to moderate regurgitation.   Regurgitation pressure half-time: 357 ms. - Mitral valve: There was mild regurgitation. - Atrial septum: A septal defect cannot be excluded. There was an   atrial septal aneurysm, predominantly within the right atrial   cavity. - Tricuspid valve: There was mild regurgitation. - Pulmonic valve: There was no significant regurgitation. - Pulmonary arteries: Systolic pressure was within the normal   range.  Impressions: - Frequent ectopy limits sensitivity. Grossly normal LVEF and wall   motion, though septum appears mildly hypokinetic in some views.   Aortic valve with mild (by jet) to moderate (by gradient)   regurgitation.   EKG:  EKG is not ordered today.   Recent Labs: 04/10/2018: ALT 11; TSH 1.37 09/23/2018: BUN 12; Creatinine, Ser 0.79; Hemoglobin 11.8; Platelets 276.0; Potassium 3.6; Sodium 138   Recent Lipid Panel    Component Value Date/Time   CHOL 258 (H) 04/10/2018 1207   TRIG 68.0 04/10/2018 1207   HDL 119.60 04/10/2018 1207   CHOLHDL 2 04/10/2018 1207   VLDL 13.6 04/10/2018 1207   LDLCALC 124 (H) 04/10/2018 1207   LDLDIRECT 104.0 03/17/2013 1026    Physical Exam:    VS:  BP 120/80   Pulse 85   Ht 5\' 2"  (1.575 m)   Wt 113 lb (51.3 kg)   SpO2 98%   BMI 20.67 kg/m     Wt Readings from Last 3 Encounters:  01/15/19 113 lb (51.3 kg)  12/24/18 115 lb (52.2 kg)  09/23/18 113 lb (51.3 kg)     Physical Exam  Constitutional: She is oriented to person, place, and time. She appears well-developed and well-nourished.  HENT:  Head: Normocephalic and atraumatic.  Neck: Normal range of motion. Neck supple. No JVD present.  Cardiovascular: Normal rate and regular rhythm.  Murmur heard.  Early systolic murmur is present with a grade of 1/6 at the upper right sternal border. Pulmonary/Chest:  Effort normal and breath sounds normal.  Abdominal: Soft. Bowel sounds are normal.  Musculoskeletal: Normal range of motion.        General: No edema.  Neurological: She is alert and oriented to person, place, and time.  Skin: Skin is warm and dry.  Psychiatric: She has a normal mood and affect. Her behavior is normal. Judgment and thought content normal.  Vitals reviewed.    ASSESSMENT:    1. Orthostatic hypotension   2. Aortic valve insufficiency, etiology of cardiac valve disease unspecified   3. Mitral valve insufficiency, unspecified etiology    PLAN:    In order of problems listed above:  1. Orthostatic hypotension -Currently not symptomatic. She is rising slowly and pumping her feet first which she says really helps. No longer wearing girdle or compression stockings. Maintains adequate fluid intake.   2. Aortic regurgitation, mod mitral regurgitation -mild by jet moderate by gradient on echo in 05/2018. Only very faint early systolic murmur.  -Repeat echo in 6 months to monitor for progression.    Medication Adjustments/Labs and Tests Ordered: Current medicines are reviewed at length with the patient today.  Concerns regarding medicines are outlined above. Labs and tests ordered and medication changes are outlined in the patient instructions below:  Patient Instructions  Medication Instructions:  Your physician recommends that you continue on your current medications as directed. Please refer to the Current Medication list given to you today.  If you need a refill on your cardiac medications before your next appointment, please call your pharmacy.   Lab work: NONE If you have labs (blood work) drawn today and your tests are completely normal, you will receive your results only by: Marland Kitchen MyChart Message (if you have MyChart) OR . A paper copy in the mail If you have any lab test that is abnormal or we need to change your treatment, we will call you to review the results.   Testing/Procedures: NONE  Follow-Up: At [facility], you and  your health needs are our priority.  As part of our continuing mission to provide you with exceptional heart care, we have created designated Provider Care Teams.  These Care Teams include your primary Cardiologist (physician) and Advanced Practice Providers (APPs -  Physician Assistants and Nurse Practitioners) who all work together to provide you with the care you need, when you need it. You will need a follow up appointment in:  6 months.  Please call our office 2 months in advance to schedule this appointment.  You may see Cindy Carnes, MD or one of the following Advanced Practice Providers on your designated Care Team: Richardson Dopp, PA-C [place], [place] . [name], NP  Any Other Special Instructions Will Be Listed Below (If Applicable).       Signed, [name], NP  01/15/2019 1:48 PM    [facility]

## 2019-01-15 NOTE — Patient Instructions (Signed)
Medication Instructions:  Your physician recommends that you continue on your current medications as directed. Please refer to the Current Medication list given to you today.  If you need a refill on your cardiac medications before your next appointment, please call your pharmacy.   Lab work: NONE If you have labs (blood work) drawn today and your tests are completely normal, you will receive your results only by: . MyChart Message (if you have MyChart) OR . A paper copy in the mail If you have any lab test that is abnormal or we need to change your treatment, we will call you to review the results.  Testing/Procedures: NONE  Follow-Up: At [facility], you and your health needs are our priority.  As part of our continuing mission to provide you with exceptional heart care, we have created designated Provider Care Teams.  These Care Teams include your primary Cardiologist (physician) and Advanced Practice Providers (APPs -  Physician Assistants and Nurse Practitioners) who all work together to provide you with the care you need, when you need it. You will need a follow up appointment in:  6 months.  Please call our office 2 months in advance to schedule this appointment.  You may see Paula Ross, MD or one of the following Advanced Practice Providers on your designated Care Team: Scott Weaver, PA-C Vin Bhagat, PA-C . Janine Hammond, NP  Any Other Special Instructions Will Be Listed Below (If Applicable).    

## 2019-01-19 ENCOUNTER — Other Ambulatory Visit (INDEPENDENT_AMBULATORY_CARE_PROVIDER_SITE_OTHER): Payer: Medicare Other

## 2019-01-19 ENCOUNTER — Encounter: Payer: Self-pay | Admitting: Internal Medicine

## 2019-01-19 ENCOUNTER — Ambulatory Visit (INDEPENDENT_AMBULATORY_CARE_PROVIDER_SITE_OTHER): Payer: Medicare Other | Admitting: Internal Medicine

## 2019-01-19 ENCOUNTER — Ambulatory Visit: Payer: Self-pay

## 2019-01-19 ENCOUNTER — Other Ambulatory Visit: Payer: Self-pay

## 2019-01-19 DIAGNOSIS — J4520 Mild intermittent asthma, uncomplicated: Secondary | ICD-10-CM | POA: Diagnosis not present

## 2019-01-19 DIAGNOSIS — R42 Dizziness and giddiness: Secondary | ICD-10-CM

## 2019-01-19 DIAGNOSIS — I951 Orthostatic hypotension: Secondary | ICD-10-CM | POA: Diagnosis not present

## 2019-01-19 LAB — CBC WITH DIFFERENTIAL/PLATELET
Basophils Absolute: 0.1 10*3/uL (ref 0.0–0.1)
Basophils Relative: 1.1 % (ref 0.0–3.0)
Eosinophils Absolute: 0.2 10*3/uL (ref 0.0–0.7)
Eosinophils Relative: 2.6 % (ref 0.0–5.0)
HCT: 34.9 % — ABNORMAL LOW (ref 36.0–46.0)
Hemoglobin: 11.7 g/dL — ABNORMAL LOW (ref 12.0–15.0)
Lymphocytes Relative: 36.6 % (ref 12.0–46.0)
Lymphs Abs: 2.3 10*3/uL (ref 0.7–4.0)
MCHC: 33.4 g/dL (ref 30.0–36.0)
MCV: 95.5 fl (ref 78.0–100.0)
Monocytes Absolute: 0.5 10*3/uL (ref 0.1–1.0)
Monocytes Relative: 7.7 % (ref 3.0–12.0)
Neutro Abs: 3.3 10*3/uL (ref 1.4–7.7)
Neutrophils Relative %: 52 % (ref 43.0–77.0)
Platelets: 258 10*3/uL (ref 150.0–400.0)
RBC: 3.65 Mil/uL — ABNORMAL LOW (ref 3.87–5.11)
RDW: 12.3 % (ref 11.5–15.5)
WBC: 6.4 10*3/uL (ref 4.0–10.5)

## 2019-01-19 LAB — BASIC METABOLIC PANEL
BUN: 12 mg/dL (ref 6–23)
CO2: 27 mEq/L (ref 19–32)
Calcium: 9.1 mg/dL (ref 8.4–10.5)
Chloride: 101 mEq/L (ref 96–112)
Creatinine, Ser: 0.91 mg/dL (ref 0.40–1.20)
GFR: 71.79 mL/min (ref >60.00)
Glucose, Bld: 102 mg/dL — ABNORMAL HIGH (ref 70–99)
Potassium: 3.8 mEq/L (ref 3.5–5.1)
Sodium: 135 mEq/L (ref 135–145)

## 2019-01-19 LAB — TSH: TSH: 1.52 u[IU]/mL (ref 0.35–4.50)

## 2019-01-19 LAB — CORTISOL: Cortisol, Plasma: 9.2 ug/dL

## 2019-01-19 MED ORDER — MIDODRINE HCL 2.5 MG PO TABS: 2.5000 mg | ORAL_TABLET | Freq: Three times a day (TID) | ORAL | 1 refills | Status: DC

## 2019-01-19 NOTE — Assessment & Plan Note (Signed)
Will start Midodrine  Labs Good hydration

## 2019-01-19 NOTE — Patient Instructions (Signed)
Orthostatic Hypotension °Blood pressure is a measurement of how strongly, or weakly, your blood is pressing against the walls of your arteries. Orthostatic hypotension is a sudden drop in blood pressure that happens when you quickly change positions, such as when you get up from sitting or lying down. °Arteries are blood vessels that carry blood from your heart throughout your body. When blood pressure is too low, you may not get enough blood to your brain or to the rest of your organs. This can cause weakness, light-headedness, rapid heartbeat, and fainting. This can last for just a few seconds or for up to a few minutes. Orthostatic hypotension is usually not a serious problem. However, if it happens frequently or gets worse, it may be a sign of something more serious. °What are the causes? °This condition may be caused by: °· Sudden changes in posture, such as standing up quickly after you have been sitting or lying down. °· Blood loss. °· Loss of body fluids (dehydration). °· Heart problems. °· Hormone (endocrine) problems. °· Pregnancy. °· Severe infection. °· Lack of certain nutrients. °· Severe allergic reactions (anaphylaxis). °· Certain medicines, such as blood pressure medicine or medicines that make the body lose excess fluids (diuretics). Sometimes, this condition can be caused by not taking medicine as directed, such as taking too much of a certain medicine. °What increases the risk? °The following factors may make you more likely to develop this condition: °· Age. Risk increases as you get older. °· Conditions that affect the heart or the central nervous system. °· Taking certain medicines, such as blood pressure medicine or diuretics. °· Being pregnant. °What are the signs or symptoms? °Symptoms of this condition may include: °· Weakness. °· Light-headedness. °· Dizziness. °· Blurred vision. °· Fatigue. °· Rapid heartbeat. °· Fainting, in severe cases. °How is this diagnosed? °This condition is  diagnosed based on: °· Your medical history. °· Your symptoms. °· Your blood pressure measurement. Your health care provider will check your blood pressure when you are: °? Lying down. °? Sitting. °? Standing. °A blood pressure reading is recorded as two numbers, such as "120 over 80" (or 120/80). The first ("top") number is called the systolic pressure. It is a measure of the pressure in your arteries as your heart beats. The second ("bottom") number is called the diastolic pressure. It is a measure of the pressure in your arteries when your heart relaxes between beats. Blood pressure is measured in a unit called mm Hg. Healthy blood pressure for most adults is 120/80. If your blood pressure is below 90/60, you may be diagnosed with hypotension. °Other information or tests that may be used to diagnose orthostatic hypotension include: °· Your other vital signs, such as your heart rate and temperature. °· Blood tests. °· Tilt table test. For this test, you will be safely secured to a table that moves you from a lying position to an upright position. Your heart rhythm and blood pressure will be monitored during the test. °How is this treated? °This condition may be treated by: °· Changing your diet. This may involve eating more salt (sodium) or drinking more water. °· Taking medicines to raise your blood pressure. °· Changing the dosage of certain medicines you are taking that might be lowering your blood pressure. °· Wearing compression stockings. These stockings help to prevent blood clots and reduce swelling in your legs. °In some cases, you may need to go to the hospital for: °· Fluid replacement. This means you will   receive fluids through an IV. °· Blood replacement. This means you will receive donated blood through an IV (transfusion). °· Treating an infection or heart problems, if this applies. °· Monitoring. You may need to be monitored while medicines that you are taking wear off. °Follow these instructions  at home: °Eating and drinking ° °· Drink enough fluid to keep your urine pale yellow. °· Eat a healthy diet, and follow instructions from your health care provider about eating or drinking restrictions. A healthy diet includes: °? Fresh fruits and vegetables. °? Whole grains. °? Lean meats. °? Low-fat dairy products. °· Eat extra salt only as directed. Do not add extra salt to your diet unless your health care provider told you to do that. °· Eat frequent, small meals. °· Avoid standing up suddenly after eating. °Medicines °· Take over-the-counter and prescription medicines only as told by your health care provider. °? Follow instructions from your health care provider about changing the dosage of your current medicines, if this applies. °? Do not stop or adjust any of your medicines on your own. °General instructions ° °· Wear compression stockings as told by your health care provider. °· Get up slowly from lying down or sitting positions. This gives your blood pressure a chance to adjust. °· Avoid hot showers and excessive heat as directed by your health care provider. °· Return to your normal activities as told by your health care provider. Ask your health care provider what activities are safe for you. °· Do not use any products that contain nicotine or tobacco, such as cigarettes, e-cigarettes, and chewing tobacco. If you need help quitting, ask your health care provider. °· Keep all follow-up visits as told by your health care provider. This is important. °Contact a health care provider if you: °· Vomit. °· Have diarrhea. °· Have a fever for more than 2-3 days. °· Feel more thirsty than usual. °· Feel weak and tired. °Get help right away if you: °· Have chest pain. °· Have a fast or irregular heartbeat. °· Develop numbness in any part of your body. °· Cannot move your arms or your legs. °· Have trouble speaking. °· Become sweaty or feel light-headed. °· Faint. °· Feel short of breath. °· Have trouble staying  awake. °· Feel confused. °Summary °· Orthostatic hypotension is a sudden drop in blood pressure that happens when you quickly change positions. °· Orthostatic hypotension is usually not a serious problem. °· It is diagnosed by having your blood pressure taken lying down, sitting, and then standing. °· It may be treated by changing your diet or adjusting your medicines. °This information is not intended to replace advice given to you by your health care provider. Make sure you discuss any questions you have with your health care provider. °Document Released: 06/15/2002 Document Revised: 12/19/2017 Document Reviewed: 12/19/2017 °Elsevier Patient Education © 2020 [employer] ° °

## 2019-01-19 NOTE — Assessment & Plan Note (Signed)
start Midodrine

## 2019-01-19 NOTE — Assessment & Plan Note (Signed)
Cont w/Advair 

## 2019-01-19 NOTE — Telephone Encounter (Signed)
Noted, pt has appt today.

## 2019-01-19 NOTE — Progress Notes (Signed)
Subjective:  Patient ID: Cindy Meza, female    DOB: [birth year]  Age: 83 y.o. MRN: [id number]  CC: No chief complaint on file.   HPI Cindy Meza presents for dizziness, lightheadedness with low BP - SBP in the upper 80s.  Outpatient Medications Prior to Visit  Medication Sig Dispense Refill  . ADVAIR HFA 115-21 MCG/ACT inhaler INHALE 2 PUFFS INTO THE LUNGS TWICE DAILY 12 g 1  . albuterol (PROVENTIL HFA;VENTOLIN HFA) 108 (90 Base) MCG/ACT inhaler Inhale 1-2 puffs into the lungs every 6 (six) hours. 1 Inhaler 1  . aspirin 81 MG tablet Take 81 mg by mouth daily.    . Calcium-Magnesium-Vitamin D (CALCIUM 500 PO) Take 1 each by mouth daily.    . Cholecalciferol 25 MCG (1000 UT) tablet Take 2 tablets (2,000 Units total) by mouth daily. 100 tablet 5  . [facility] OMEGA-3 KRILL OIL 500 MG CAPS Take 500 capsules by mouth daily.    . fluticasone (FLONASE) 50 MCG/ACT nasal spray Place 2 sprays into both nostrils daily. 16 g 6  . loratadine (CLARITIN) 10 MG tablet Take 10 mg by mouth daily.     Marland Kitchen omeprazole (PRILOSEC) 40 MG capsule TAKE 1 CAPSULE(40 MG) BY MOUTH DAILY 90 capsule 3   No facility-administered medications prior to visit.     ROS: Review of Systems  Constitutional: Positive for fatigue. Negative for activity change, appetite change, chills and unexpected weight change.  HENT: Negative for congestion, mouth sores and sinus pressure.   Eyes: Negative for visual disturbance.  Respiratory: Negative for cough and chest tightness.   Gastrointestinal: Negative for abdominal pain and nausea.  Genitourinary: Negative for difficulty urinating, frequency and vaginal pain.  Musculoskeletal: Negative for back pain and gait problem.  Skin: Negative for pallor and rash.  Neurological: Positive for dizziness, weakness and light-headedness. Negative for tremors, numbness and headaches.  Psychiatric/Behavioral: Negative for confusion and sleep disturbance. The patient is nervous/anxious.      Objective:  BP 104/66 (BP Location: Left Arm, Patient Position: Standing, Cuff Size: Normal)   Pulse 85   Temp (!) 97.5 F (36.4 C) (Oral)   Ht 5\' 2"  (1.575 m)   Wt 114 lb (51.7 kg)   SpO2 99%   BMI 20.85 kg/m   BP Readings from Last 3 Encounters:  01/19/19 104/66  01/15/19 120/80  12/24/18 122/74    Wt Readings from Last 3 Encounters:  01/19/19 114 lb (51.7 kg)  01/15/19 113 lb (51.3 kg)  12/24/18 115 lb (52.2 kg)    Physical Exam Constitutional:      General: She is not in acute distress.    Appearance: She is well-developed.  HENT:     Head: Normocephalic.     Right Ear: External ear normal.     Left Ear: External ear normal.     Nose: Nose normal.  Eyes:     General:        Right eye: No discharge.        Left eye: No discharge.     Conjunctiva/sclera: Conjunctivae normal.     Pupils: Pupils are equal, round, and reactive to light.  Neck:     Musculoskeletal: Normal range of motion and neck supple.     Thyroid: No thyromegaly.     Vascular: No JVD.     Trachea: No tracheal deviation.  Cardiovascular:     Rate and Rhythm: Normal rate and regular rhythm.     Heart sounds: Normal heart sounds.  Pulmonary:     Effort: No respiratory distress.     Breath sounds: No stridor. No wheezing.  Abdominal:     General: Bowel sounds are normal. There is no distension.     Palpations: Abdomen is soft. There is no mass.     Tenderness: There is no abdominal tenderness. There is no guarding or rebound.  Musculoskeletal:        General: No tenderness.  Lymphadenopathy:     Cervical: No cervical adenopathy.  Skin:    Findings: No erythema or rash.  Neurological:     Cranial Nerves: No cranial nerve deficit.     Motor: No abnormal muscle tone.     Coordination: Coordination normal.     Deep Tendon Reflexes: Reflexes normal.  Psychiatric:        Behavior: Behavior normal.        Thought Content: Thought content normal.        Judgment: Judgment normal.     Lab  Results  Component Value Date   WBC 5.7 09/23/2018   HGB 11.8 (L) 09/23/2018   HCT 35.3 (L) 09/23/2018   PLT 276.0 09/23/2018   GLUCOSE 116 (H) 09/23/2018   CHOL 258 (H) 04/10/2018   TRIG 68.0 04/10/2018   HDL 119.60 04/10/2018   LDLDIRECT 104.0 03/17/2013   LDLCALC 124 (H) 04/10/2018   ALT 11 04/10/2018   AST 20 04/10/2018   NA 138 09/23/2018   K 3.6 09/23/2018   CL 103 09/23/2018   CREATININE 0.79 09/23/2018   BUN 12 09/23/2018   CO2 29 09/23/2018   TSH 1.37 04/10/2018   INR 1.04 06/12/2016   HGBA1C 4.9 04/09/2017    US Carotid Duplex Bilateral  Result Date: 07/15/2018 CLINICAL DATA:  Dizziness EXAM: BILATERAL CAROTID DUPLEX ULTRASOUND TECHNIQUE: Pearline Cables scale imaging, color Doppler and duplex ultrasound were performed of bilateral carotid and vertebral arteries in the neck. COMPARISON:  None. FINDINGS: Criteria: Quantification of carotid stenosis is based on velocity parameters that correlate the residual internal carotid diameter with NASCET-based stenosis levels, using the diameter of the distal internal carotid lumen as the denominator for stenosis measurement. The following velocity measurements were obtained: RIGHT ICA: 77/23 cm/sec CCA: 62/22 cm/sec SYSTOLIC ICA/CCA RATIO:  1.0 ECA: 62 cm/sec LEFT ICA: 110/32 cm/sec CCA: 97/98 cm/sec SYSTOLIC ICA/CCA RATIO:  1.4 ECA: 37 cm/sec RIGHT CAROTID ARTERY: Eccentric partially calcified plaque in the bulb involving the ECA origin. No high-grade stenosis. Normal waveforms and color Doppler signal. Mildly tortuous ICA. RIGHT VERTEBRAL ARTERY:  Normal flow direction and waveform. LEFT CAROTID ARTERY: Intimal thickening in the bulb. No significant stenosis. Normal waveforms and color Doppler signal. Tortuous ICA. LEFT VERTEBRAL ARTERY:  Normal flow direction and waveform. Upper extremity blood pressures: RIGHT: 138/73 LEFT: 145/74 IMPRESSION: 1. Right carotid bifurcation plaque resulting in less than 50% diameter stenosis. 2.  Antegrade bilateral  vertebral arterial flow. Electronically Signed   By: Lucrezia Europe M.D.   On: 07/15/2018 15:04    Assessment & Plan:   There are no diagnoses linked to this encounter.   No orders of the defined types were placed in this encounter.    Follow-up: No follow-ups on file.  [name], MD

## 2019-01-19 NOTE — Telephone Encounter (Signed)
Returned call to patient who states that she got up today feeling weak/dizzy.  She took her BP 86/63 HR 96.  She ate some salty chips and drank a juice and BO 112/68 HR 90.  She took it again at 10 am and BP 106/65 HR 84.  She states that her weakness has gotten better.  She has an Dx of orthostatic hypotension. She has just been to see her Cardiologist and states everything was fine. Care advice read to patient. Call transfered to office for scheduling.   Reason for Disposition . [2] Fall in systolic BP > 20 mm Hg from normal AND [2] dizzy, lightheaded, or weak  Answer Assessment - Initial Assessment Questions 1. BLOOD PRESSURE: "What is the blood pressure?" "Did you take at least two measurements 5 minutes apart?"     86/63 hr 96 112/68 Hr 90 109/65 Hr 84 2. ONSET: "When did you take your blood pressure?"     This AM 3. HOW: "How did you obtain the blood pressure?" (e.g., visiting nurse, automatic home BP monitor)     home 4. HISTORY: "Do you have a history of low blood pressure?" "What is your blood pressure normally?"     yes 5. MEDICATIONS: "Are you taking any medications for blood pressure?" If yes: "Have they been changed recently?"    no 6. PULSE RATE: "Do you know what your pulse rate is?"     84 now 7. OTHER SYMPTOMS: "Have you been sick recently?" "Have you had a recent injury?"     Feels weak  Started Friday 8. PREGNANCY: "Is there any chance you are pregnant?" "When was your last menstrual period?"     N/A  Protocols used: LOW BLOOD PRESSURE-A-AH

## 2019-02-12 ENCOUNTER — Other Ambulatory Visit: Payer: Self-pay

## 2019-02-12 ENCOUNTER — Ambulatory Visit (INDEPENDENT_AMBULATORY_CARE_PROVIDER_SITE_OTHER): Payer: Medicare Other | Admitting: Internal Medicine

## 2019-02-12 ENCOUNTER — Encounter: Payer: Self-pay | Admitting: Internal Medicine

## 2019-02-12 DIAGNOSIS — W19XXXS Unspecified fall, sequela: Secondary | ICD-10-CM | POA: Diagnosis not present

## 2019-02-12 DIAGNOSIS — R5383 Other fatigue: Secondary | ICD-10-CM

## 2019-02-12 DIAGNOSIS — I951 Orthostatic hypotension: Secondary | ICD-10-CM | POA: Diagnosis not present

## 2019-02-12 DIAGNOSIS — R110 Nausea: Secondary | ICD-10-CM

## 2019-02-12 HISTORY — DX: Nausea: R11.0

## 2019-02-12 MED ORDER — MIDODRINE HCL 2.5 MG PO TABS: 2.5000 mg | ORAL_TABLET | Freq: Three times a day (TID) | ORAL | 11 refills | Status: DC

## 2019-02-12 MED ORDER — FLUDROCORTISONE ACETATE 0.1 MG PO TABS: 0.0500 mg | ORAL_TABLET | Freq: Every day | ORAL | 5 refills | Status: DC

## 2019-02-12 NOTE — Assessment & Plan Note (Addendum)
Midodrin -we will have to discontinue due to nausea  We will start Florinef.  Risks/benefits discussed

## 2019-02-12 NOTE — Progress Notes (Signed)
Subjective:  Patient ID: Cindy Meza, female    DOB: 1935/11/07  Age: 83 y.o. MRN: [id number]  CC: No chief complaint on file.   HPI Cindy Meza presents for orthostatic blood pressure drops with lightheadedness and dizziness- feeling better on Midodrin. She was nauseated on tid Midodrin - better on bid.  No blood pressure drops according to her home record.  Outpatient Medications Prior to Visit  Medication Sig Dispense Refill  . ADVAIR HFA 115-21 MCG/ACT inhaler INHALE 2 PUFFS INTO THE LUNGS TWICE DAILY 12 g 1  . albuterol (PROVENTIL HFA;VENTOLIN HFA) 108 (90 Base) MCG/ACT inhaler Inhale 1-2 puffs into the lungs every 6 (six) hours. 1 Inhaler 1  . aspirin 81 MG tablet Take 81 mg by mouth daily.    . Calcium-Magnesium-Vitamin D (CALCIUM 500 PO) Take 1 each by mouth daily.    . Cholecalciferol 25 MCG (1000 UT) tablet Take 2 tablets (2,000 Units total) by mouth daily. 100 tablet 5  . [facility] OMEGA-3 KRILL OIL 500 MG CAPS Take 500 capsules by mouth daily.    . fluticasone (FLONASE) 50 MCG/ACT nasal spray Place 2 sprays into both nostrils daily. 16 g 6  . loratadine (CLARITIN) 10 MG tablet Take 10 mg by mouth daily.     . midodrine (PROAMATINE) 2.5 MG tablet Take 1 tablet (2.5 mg total) by mouth 3 (three) times daily with meals. (Patient taking differently: Take 2.5 mg by mouth 2 (two) times daily with a meal. ) 90 tablet 1  . omeprazole (PRILOSEC) 40 MG capsule TAKE 1 CAPSULE(40 MG) BY MOUTH DAILY 90 capsule 3   No facility-administered medications prior to visit.     ROS: Review of Systems  Constitutional: Negative for activity change, appetite change, chills, fatigue and unexpected weight change.  HENT: Negative for congestion, mouth sores and sinus pressure.   Eyes: Negative for visual disturbance.  Respiratory: Negative for cough and chest tightness.   Gastrointestinal: Positive for nausea. Negative for abdominal pain.  Genitourinary: Negative for difficulty urinating,  frequency and vaginal pain.  Musculoskeletal: Negative for back pain and gait problem.  Skin: Negative for pallor and rash.  Neurological: Negative for dizziness, tremors, weakness, light-headedness, numbness and headaches.  Psychiatric/Behavioral: Negative for confusion and sleep disturbance.    Objective:  BP 112/80 (BP Location: Left Arm, Patient Position: Sitting, Cuff Size: Normal)   Pulse 76   Temp 98 F (36.7 C) (Temporal)   Ht 5\' 2"  (1.575 m)   Wt 112 lb (50.8 kg)   SpO2 99%   BMI 20.49 kg/m   BP Readings from Last 3 Encounters:  02/12/19 112/80  01/19/19 104/66  01/15/19 120/80    Wt Readings from Last 3 Encounters:  02/12/19 112 lb (50.8 kg)  01/19/19 114 lb (51.7 kg)  01/15/19 113 lb (51.3 kg)    Physical Exam Constitutional:      General: She is not in acute distress.    Appearance: She is well-developed.  HENT:     Head: Normocephalic.     Right Ear: External ear normal.     Left Ear: External ear normal.     Nose: Nose normal.  Eyes:     General:        Right eye: No discharge.        Left eye: No discharge.     Conjunctiva/sclera: Conjunctivae normal.     Pupils: Pupils are equal, round, and reactive to light.  Neck:     Musculoskeletal: Normal range  of motion and neck supple.     Thyroid: No thyromegaly.     Vascular: No JVD.     Trachea: No tracheal deviation.  Cardiovascular:     Rate and Rhythm: Normal rate and regular rhythm.     Heart sounds: Normal heart sounds.  Pulmonary:     Effort: No respiratory distress.     Breath sounds: No stridor. No wheezing.  Abdominal:     General: Bowel sounds are normal. There is no distension.     Palpations: Abdomen is soft. There is no mass.     Tenderness: There is no abdominal tenderness. There is no guarding or rebound.  Musculoskeletal:        General: No tenderness.  Lymphadenopathy:     Cervical: No cervical adenopathy.  Skin:    Findings: No erythema or rash.  Neurological:     Mental  Status: She is oriented to person, place, and time.     Cranial Nerves: No cranial nerve deficit.     Motor: No abnormal muscle tone.     Coordination: Coordination normal.     Deep Tendon Reflexes: Reflexes normal.  Psychiatric:        Behavior: Behavior normal.        Thought Content: Thought content normal.        Judgment: Judgment normal.     Lab Results  Component Value Date   WBC 6.4 01/19/2019   HGB 11.7 (L) 01/19/2019   HCT 34.9 (L) 01/19/2019   PLT 258.0 01/19/2019   GLUCOSE 102 (H) 01/19/2019   CHOL 258 (H) 04/10/2018   TRIG 68.0 04/10/2018   HDL 119.60 04/10/2018   LDLDIRECT 104.0 03/17/2013   LDLCALC 124 (H) 04/10/2018   ALT 11 04/10/2018   AST 20 04/10/2018   NA 135 01/19/2019   K 3.8 01/19/2019   CL 101 01/19/2019   CREATININE 0.91 01/19/2019   BUN 12 01/19/2019   CO2 27 01/19/2019   TSH 1.52 01/19/2019   INR 1.04 06/12/2016   HGBA1C 4.9 04/09/2017    US Carotid Duplex Bilateral  Result Date: 07/15/2018 CLINICAL DATA:  Dizziness EXAM: BILATERAL CAROTID DUPLEX ULTRASOUND TECHNIQUE: Pearline Cables scale imaging, color Doppler and duplex ultrasound were performed of bilateral carotid and vertebral arteries in the neck. COMPARISON:  None. FINDINGS: Criteria: Quantification of carotid stenosis is based on velocity parameters that correlate the residual internal carotid diameter with NASCET-based stenosis levels, using the diameter of the distal internal carotid lumen as the denominator for stenosis measurement. The following velocity measurements were obtained: RIGHT ICA: 77/23 cm/sec CCA: 46/96 cm/sec SYSTOLIC ICA/CCA RATIO:  1.0 ECA: 62 cm/sec LEFT ICA: 110/32 cm/sec CCA: 29/52 cm/sec SYSTOLIC ICA/CCA RATIO:  1.4 ECA: 37 cm/sec RIGHT CAROTID ARTERY: Eccentric partially calcified plaque in the bulb involving the ECA origin. No high-grade stenosis. Normal waveforms and color Doppler signal. Mildly tortuous ICA. RIGHT VERTEBRAL ARTERY:  Normal flow direction and waveform. LEFT  CAROTID ARTERY: Intimal thickening in the bulb. No significant stenosis. Normal waveforms and color Doppler signal. Tortuous ICA. LEFT VERTEBRAL ARTERY:  Normal flow direction and waveform. Upper extremity blood pressures: RIGHT: 138/73 LEFT: 145/74 IMPRESSION: 1. Right carotid bifurcation plaque resulting in less than 50% diameter stenosis. 2.  Antegrade bilateral vertebral arterial flow. Electronically Signed   By: Lucrezia Europe M.D.   On: 07/15/2018 15:04    Assessment & Plan:   There are no diagnoses linked to this encounter.   No orders of the defined types were placed in  this encounter.    Follow-up: No follow-ups on file.  [name], MD

## 2019-02-12 NOTE — Assessment & Plan Note (Addendum)
d/c Midodrin due to side effect-nausea

## 2019-02-12 NOTE — Patient Instructions (Signed)
Stop Midodrine Start Florinef

## 2019-02-14 ENCOUNTER — Encounter: Payer: Self-pay | Admitting: Internal Medicine

## 2019-02-14 NOTE — Assessment & Plan Note (Signed)
She felt better on Midodrin

## 2019-02-14 NOTE — Assessment & Plan Note (Signed)
No relapse 

## 2019-02-16 ENCOUNTER — Ambulatory Visit: Payer: Self-pay | Admitting: *Deleted

## 2019-02-16 NOTE — Telephone Encounter (Signed)
Summary: mediation problem    Pt called and stated that she started medication fludrocortisone (FLORINEF) 0.1 MG tablet [[id number]]. Pt states that she is staring to feel tired and week from medication. Pt also reports nausea and some tightness in her chest. Pt would like a call back regarding. Please advise      Returned call to patient regarding taking a new medication, Florinef. C/o of a fast heart beat, some nausea, weakness, and being tired.  She is taking it like her pcp prescribed it, a half tablet with breakfast but also stated that he told her to take another half at lunch if she had the feelings of tiredness and weakness.  Her b/p this morning was 92/62 and HR 94. She rechecked her b/p and now it is 124/83 sitting and HR 89 and standing 134/83 and HR 85.  She wanted to speak with her provider before taking this morning's dose of medication. She is requesting a call back today regarding this medication. [facility] at [facility] notified. Pt advised to continue to take her medication until she speaks with a provider. And also to be mindful of her b/p, increase her fluids. She stated she was also advised to eat salty foods like chips or pretzels. She voiced understanding. Routing to the practice for review. Reason for Disposition . [1] Caller has NON-URGENT medication question about med that PCP prescribed AND [2] triager unable to answer question  Answer Assessment - Initial Assessment Questions 1.   NAME of MEDICATION: "What medicine are you calling about?"     Florinef 0.1 mg 2.   QUESTION: "What is your question?"     If she should take her medication.  3.   PRESCRIBING HCP: "Who prescribed it?" Reason: if prescribed by specialist, call should be referred to that group.     Dr. Alain Marion 4. SYMPTOMS: "Do you have any symptoms?"     Tired, weakness, nausea, a fast heart beat. 5. SEVERITY: If symptoms are present, ask "Are they mild, moderate or severe?"     Mild to moderate 6.  PREGNANCY:   "Is there any chance that you are pregnant?" "When was your last menstrual period?"     n/a  Protocols used: MEDICATION QUESTION CALL-A-AH

## 2019-02-17 ENCOUNTER — Other Ambulatory Visit: Payer: Self-pay

## 2019-02-17 ENCOUNTER — Encounter: Payer: Self-pay | Admitting: Family

## 2019-02-17 ENCOUNTER — Ambulatory Visit (INDEPENDENT_AMBULATORY_CARE_PROVIDER_SITE_OTHER): Payer: Medicare Other | Admitting: Family

## 2019-02-17 VITALS — BP 104/70 | HR 88 | Temp 97.5°F | Ht 62.0 in | Wt 111.0 lb

## 2019-02-17 DIAGNOSIS — I951 Orthostatic hypotension: Secondary | ICD-10-CM | POA: Diagnosis not present

## 2019-02-17 NOTE — Progress Notes (Signed)
Cindy Meza is a 83 y.o. female with the following history as recorded in EpicCare:  Patient Active Problem List   Diagnosis Date Noted  . Nausea 02/12/2019  . Aortic regurgitation 06/16/2018  . Fall 06/12/2016  . Closed right hip fracture ([place]) 06/12/2016  . Cerumen impaction 04/06/2016  . Fatigue 02/15/2016  . DOE (dyspnea on exertion) 02/15/2016  . Headache 09/27/2015  . Coronary atherosclerosis 06/29/2015  . Urgency of urination 05/17/2015  . Paresthesia of arm 09/21/2014  . Breast pain, left 09/21/2014  . Chest pressure 07/22/2012  . Lightheadedness 07/17/2012  . Orthostatic hypotension 07/17/2012  . Hot flash, menopausal 02/15/2012  . Allergic rhinitis 09/12/2011  . Well adult exam 02/14/2011  . HEMORRHOIDS, WITH BLEEDING 10/05/2009  . Vitamin D deficiency 06/17/2007  . Asthma 06/17/2007  . GERD (gastroesophageal reflux disease) 06/17/2007  . OSTEOARTHRITIS 06/17/2007  . Osteoporosis 06/17/2007  . COLONIC POLYPS, HX OF 06/17/2007    Current Outpatient Medications  Medication Sig Dispense Refill  . ADVAIR HFA 115-21 MCG/ACT inhaler INHALE 2 PUFFS INTO THE LUNGS TWICE DAILY 12 g 1  . albuterol (PROVENTIL HFA;VENTOLIN HFA) 108 (90 Base) MCG/ACT inhaler Inhale 1-2 puffs into the lungs every 6 (six) hours. 1 Inhaler 1  . aspirin 81 MG tablet Take 81 mg by mouth daily.    . Calcium-Magnesium-Vitamin D (CALCIUM 500 PO) Take 1 each by mouth daily.    . Cholecalciferol 25 MCG (1000 UT) tablet Take 2 tablets (2,000 Units total) by mouth daily. 100 tablet 5  . CVS OMEGA-3 KRILL OIL 500 MG CAPS Take 500 capsules by mouth daily.    . fludrocortisone (FLORINEF) 0.1 MG tablet Take 0.5 tablets (0.05 mg total) by mouth daily. Take with breakfast 30 tablet 5  . fluticasone (FLONASE) 50 MCG/ACT nasal spray Place 2 sprays into both nostrils daily. 16 g 6  . loratadine (CLARITIN) 10 MG tablet Take 10 mg by mouth daily.     Marland Kitchen omeprazole (PRILOSEC) 40 MG capsule TAKE 1 CAPSULE(40 MG) BY  MOUTH DAILY 90 capsule 3   No current facility-administered medications for this visit.     Allergies: Fosamax [alendronate sodium], Gabapentin, Lidocaine, Midodrine, Risedronate sodium, Shellfish allergy, and Statins  Past Medical History:  Diagnosis Date  . Allergy   . Anemia   . Asthma    Dr. Melvyn Meza  . Blood transfusion without reported diagnosis   . Breast cyst    Left  . Cataract    Dr. Gershon Meza  . Complication of anesthesia    ' I HAD HALLUCINATIONS WHEN THEY DID MY HIP "  . GERD (gastroesophageal reflux disease)   . Heart murmur   . Hemorrhoids   . History of colonic polyps Cindy Meza   hx of adenomas (small) 1998  . OA (osteoarthritis)   . Osteopenia   . PAC (premature atrial contraction)   . Vitamin D deficiency     Past Surgical History:  Procedure Laterality Date  . ABDOMINAL HYSTERECTOMY     complete  . APPENDECTOMY    . BREAST CYST EXCISION    . CATARACT EXTRACTION, BILATERAL     04/23/18 left eye, 04/02/18 right eye  . COLONOSCOPY  Multiple   Adenomatous colon polyps  . ESOPHAGOGASTRODUODENOSCOPY  2010  . INTRAMEDULLARY (IM) NAIL INTERTROCHANTERIC Right 06/13/2016   Procedure: INTRAMEDULLARY (IM) NAIL INTERTROCHANTRIC;  Surgeon: Cindy Ade, MD;  Location: [facility];  Service: Orthopedics;  Laterality: Right;  . TONSILLECTOMY      Family History  Problem Relation Age  of Onset  . Diabetes Mother 70       Deceased  . Hypertension Other   . Colon cancer Neg Hx   . Pancreatic cancer Neg Hx   . Stomach cancer Neg Hx   . Esophageal cancer Neg Hx   . Rectal cancer Neg Hx     Social History   Tobacco Use  . Smoking status: Never Smoker  . Smokeless tobacco: Never Used  Substance Use Topics  . Alcohol use: Yes    Alcohol/week: 0.0 standard drinks    Comment: 3-4 times per week - vodka 1 drink    Subjective:  History of orthostatic hypotension; was originally treated with Midodrin; could not tolerate at tid- was changed to bid but still had nausea; last  Thursday, was started on Florinef; is concerned that the side effects of this medication are actually worse than the Midodrin- feeing "weak, shaky, nauseated." Is currently only taking 1/2 tablet of Florinef with option to take 2nd 1/2 if needed;     Objective:  Vitals:   02/17/19 1240  BP: 104/70  Pulse: 88  Temp: (!) 97.5 F (36.4 C)  TempSrc: Oral  SpO2: 99%  Weight: 111 lb 0.6 oz (50.4 kg)  Height: 5\' 2"  (1.575 m)    General: Well developed, well nourished, in no acute distress  Skin : Warm and dry.  Head: Normocephalic and atraumatic  Eyes: Sclera and conjunctiva clear; pupils round and reactive to light; extraocular movements intact  Ears: External normal; canals clear; tympanic membranes normal  Oropharynx: Pink, supple. No suspicious lesions  Neck: Supple without thyromegaly, adenopathy  Lungs: Respirations unlabored; clear to auscultation bilaterally without wheeze, rales, rhonchi  CVS exam: normal rate and regular rhythm.  Neurologic: Alert and oriented; speech intact; face symmetrical; moves all extremities well; CNII-XII intact without focal deficit   Assessment:  1. Orthostatic hypotension     Plan:  Will have patient hold Florinef at this time; she will try to eat more salt, drink V8 for now; I have reached out to her cardiologist to discuss other treatment options/ options with the medications; if nausea persists off the Florinef, will need to consider alternative work up; follow-up to be determined.   No follow-ups on file.  No orders of the defined types were placed in this encounter.   Requested Prescriptions    No prescriptions requested or ordered in this encounter

## 2019-02-18 ENCOUNTER — Telehealth: Payer: Self-pay | Admitting: Family

## 2019-02-18 NOTE — Telephone Encounter (Signed)
Please let her know that Cindy Meza ( her NP from cardiology) is reaching out to another provider in that office for other possible options. As I suspected, they didn't have other medication options.

## 2019-02-18 NOTE — Telephone Encounter (Signed)
I have talked with patient---she has taken bp readings at home x2 today, morning and afternoon--both readings are 660'A-004'H systolic and 99'H diastolic--heart rate is 74'F--SELTRVU states she doesn't feel as shaky today as yesterday since stopping medication---she is still feeling a little weak, she has a cane she sometimes uses when needed, I have recommended she use that for ambulation until she feels stronger to prevent falling---her chest tightness is not quite as bad as yesterday, and has not worsened---I have advised that laura has reached out to cardiology to ask if dr Harrington Challenger could advise on what patient should do---we are still waiting for NP from dr Harrington Challenger office to call back---if patient is comfortable now and symptoms have not worsened, ok for her to try to relax until we hear from cardiology unless she would feel more comfortable going to ED or [facility] to be evaluated now---if symptoms worsen, seek immediate help by calling 911---patient also has a friend to help her in evening if she needs it and she could call friend to assist if needed

## 2019-02-18 NOTE — Telephone Encounter (Signed)
Spoke with patient and info given. Informed her that Mickel Baas was out of the office tomorrow and she would be back on Friday. Waiting to hear back from cardiology for possible alternatives.

## 2019-02-18 NOTE — Telephone Encounter (Signed)
Patient called back again and requested to speak with me. I reiterated what Mickel Baas had previously said and that we were still waiting to hear back from cardiology.  Patient states now that she doesn't know if she should go to emergency room or not.   Reports symptoms today of feeling shaky, tightness in chest, and weak. She has eaten breakfast and lunch today.  She has not taken the Florinef as Mickel Baas told her yesterday to stop medication due to same side-effects listed above.   Told patient I would have our nurse Jonelle Sidle call and triage her to see what she needed to do.

## 2019-02-18 NOTE — Telephone Encounter (Signed)
-----   Message from [name], NP sent at 02/18/2019  1:06 PM EDT ----- Mickel Baas, I am at a loss at to what else we can do.  She seemed to improve in the past with use of a girdle and compression stockings, maintaining hydration. I will send this to Dr. Harrington Challenger for her input. She does have valvular disease.    Sherlyn Hay Gae Bon) ----- Message ----- From: [name], FNP Sent: 02/17/2019   2:12 PM EDT To: [name], NP  Good afternoon-  I was reaching out to you about a mutual patient, Cindy Meza. As you know, she has a history of orthostatic hypotension. She has opted to try try medication since she saw you last. She had to stop Midodrin due to nausea and is now feeling similar side effects with Florinef. Do you have any other thoughts for medication options? She seemed to do better with Midodrin bid as opposed to tid- I had thought 1.25 mg tid as opposed to 2.5 mg. She is definitely anxious and this may be contributing to a lot of her side effects. Any thoughts you have for treatments or alternatives would really be appreciated.  Thank you- Jodi Mourning, FNP [place] [facility]

## 2019-02-20 NOTE — Telephone Encounter (Signed)
Pt calling to check status. Pt would like a call back from the nurse today regarding below. Waiting to hear from Mickel Baas.  Please advise

## 2019-02-23 NOTE — Telephone Encounter (Signed)
Patient advised, that per laura, cardiology doesn't really need to see her----not a lot of options or choices with meds to help orthostatic hypotension---she prev tried midrodrine and didn't like, so she was prescribed florinef---if she doesn't like that medication, the only other thing to try is to eat more salt when pressure drops and she feels light headed---patient stated she was already drinking enough water, so she should be well hydrated---patient stated she would start florinef at breakfast tomorrow morning and try that to see if it would help

## 2019-02-26 ENCOUNTER — Encounter: Payer: Self-pay | Admitting: Internal Medicine

## 2019-02-26 ENCOUNTER — Other Ambulatory Visit: Payer: Self-pay

## 2019-02-26 ENCOUNTER — Ambulatory Visit (INDEPENDENT_AMBULATORY_CARE_PROVIDER_SITE_OTHER): Payer: Medicare Other | Admitting: Internal Medicine

## 2019-02-26 DIAGNOSIS — K219 Gastro-esophageal reflux disease without esophagitis: Secondary | ICD-10-CM | POA: Diagnosis not present

## 2019-02-26 DIAGNOSIS — M8000XS Age-related osteoporosis with current pathological fracture, unspecified site, sequela: Secondary | ICD-10-CM

## 2019-02-26 DIAGNOSIS — R110 Nausea: Secondary | ICD-10-CM

## 2019-02-26 DIAGNOSIS — I951 Orthostatic hypotension: Secondary | ICD-10-CM

## 2019-02-26 MED ORDER — HYDROCORTISONE 10 MG PO TABS: 10.0000 mg | ORAL_TABLET | Freq: Every day | ORAL | 5 refills | Status: DC

## 2019-02-26 NOTE — Progress Notes (Signed)
Subjective:  Patient ID: Cindy Meza, female    DOB: April 02, 1936  Age: 83 y.o. MRN: [id number]  CC: No chief complaint on file.   HPI Cindy Meza presents for orthostatic hypotension - better on Florinef, however c/o side effects (shaky, tightness in chest, dizziness, nausea) randomly... Has issues even w/a lesser dose  Outpatient Medications Prior to Visit  Medication Sig Dispense Refill  . ADVAIR HFA 115-21 MCG/ACT inhaler INHALE 2 PUFFS INTO THE LUNGS TWICE DAILY 12 g 1  . albuterol (PROVENTIL HFA;VENTOLIN HFA) 108 (90 Base) MCG/ACT inhaler Inhale 1-2 puffs into the lungs every 6 (six) hours. 1 Inhaler 1  . aspirin 81 MG tablet Take 81 mg by mouth daily.    . Calcium-Magnesium-Vitamin D (CALCIUM 500 PO) Take 1 each by mouth daily.    . Cholecalciferol 25 MCG (1000 UT) tablet Take 2 tablets (2,000 Units total) by mouth daily. 100 tablet 5  . CVS OMEGA-3 KRILL OIL 500 MG CAPS Take 500 capsules by mouth daily.    . fludrocortisone (FLORINEF) 0.1 MG tablet Take 0.5 tablets (0.05 mg total) by mouth daily. Take with breakfast 30 tablet 5  . fluticasone (FLONASE) 50 MCG/ACT nasal spray Place 2 sprays into both nostrils daily. 16 g 6  . loratadine (CLARITIN) 10 MG tablet Take 10 mg by mouth daily.     Marland Kitchen omeprazole (PRILOSEC) 40 MG capsule TAKE 1 CAPSULE(40 MG) BY MOUTH DAILY 90 capsule 3   No facility-administered medications prior to visit.     ROS: Review of Systems  Constitutional: Positive for fatigue. Negative for activity change, appetite change, chills and unexpected weight change.  HENT: Negative for congestion, mouth sores and sinus pressure.   Eyes: Negative for visual disturbance.  Respiratory: Negative for cough and chest tightness.   Gastrointestinal: Positive for nausea. Negative for abdominal pain.  Genitourinary: Negative for difficulty urinating, frequency and vaginal pain.  Musculoskeletal: Negative for back pain and gait problem.  Skin: Negative for pallor and  rash.  Neurological: Positive for dizziness. Negative for tremors, syncope, weakness, numbness and headaches.  Psychiatric/Behavioral: Negative for confusion and sleep disturbance. The patient is nervous/anxious.     Objective:  BP 120/80 (BP Location: Left Arm, Patient Position: Sitting, Cuff Size: Normal)   Pulse 83   Temp 97.6 F (36.4 C) (Oral)   Ht 5\' 2"  (1.575 m)   Wt 111 lb (50.3 kg)   SpO2 99%   BMI 20.30 kg/m   BP Readings from Last 3 Encounters:  02/26/19 120/80  02/17/19 104/70  02/12/19 112/80    Wt Readings from Last 3 Encounters:  02/26/19 111 lb (50.3 kg)  02/17/19 111 lb 0.6 oz (50.4 kg)  02/12/19 112 lb (50.8 kg)    Physical Exam Constitutional:      General: She is not in acute distress.    Appearance: She is well-developed.  HENT:     Head: Normocephalic.     Right Ear: External ear normal.     Left Ear: External ear normal.     Nose: Nose normal.  Eyes:     General:        Right eye: No discharge.        Left eye: No discharge.     Conjunctiva/sclera: Conjunctivae normal.     Pupils: Pupils are equal, round, and reactive to light.  Neck:     Musculoskeletal: Normal range of motion and neck supple.     Thyroid: No thyromegaly.  Vascular: No JVD.     Trachea: No tracheal deviation.  Cardiovascular:     Rate and Rhythm: Normal rate and regular rhythm.     Heart sounds: Normal heart sounds.  Pulmonary:     Effort: No respiratory distress.     Breath sounds: No stridor. No wheezing.  Abdominal:     General: Bowel sounds are normal. There is no distension.     Palpations: Abdomen is soft. There is no mass.     Tenderness: There is no abdominal tenderness. There is no guarding or rebound.  Musculoskeletal:        General: No tenderness.  Lymphadenopathy:     Cervical: No cervical adenopathy.  Skin:    Findings: No erythema or rash.  Neurological:     Mental Status: She is oriented to person, place, and time.     Cranial Nerves: No  cranial nerve deficit.     Motor: No abnormal muscle tone.     Coordination: Coordination abnormal.     Deep Tendon Reflexes: Reflexes normal.  Psychiatric:        Behavior: Behavior normal.        Thought Content: Thought content normal.        Judgment: Judgment normal.    Thin  Lab Results  Component Value Date   WBC 6.4 01/19/2019   HGB 11.7 (L) 01/19/2019   HCT 34.9 (L) 01/19/2019   PLT 258.0 01/19/2019   GLUCOSE 102 (H) 01/19/2019   CHOL 258 (H) 04/10/2018   TRIG 68.0 04/10/2018   HDL 119.60 04/10/2018   LDLDIRECT 104.0 03/17/2013   LDLCALC 124 (H) 04/10/2018   ALT 11 04/10/2018   AST 20 04/10/2018   NA 135 01/19/2019   K 3.8 01/19/2019   CL 101 01/19/2019   CREATININE 0.91 01/19/2019   BUN 12 01/19/2019   CO2 27 01/19/2019   TSH 1.52 01/19/2019   INR 1.04 06/12/2016   HGBA1C 4.9 04/09/2017    US Carotid Duplex Bilateral  Result Date: 07/15/2018 CLINICAL DATA:  Dizziness EXAM: BILATERAL CAROTID DUPLEX ULTRASOUND TECHNIQUE: Pearline Cables scale imaging, color Doppler and duplex ultrasound were performed of bilateral carotid and vertebral arteries in the neck. COMPARISON:  None. FINDINGS: Criteria: Quantification of carotid stenosis is based on velocity parameters that correlate the residual internal carotid diameter with NASCET-based stenosis levels, using the diameter of the distal internal carotid lumen as the denominator for stenosis measurement. The following velocity measurements were obtained: RIGHT ICA: 77/23 cm/sec CCA: 95/09 cm/sec SYSTOLIC ICA/CCA RATIO:  1.0 ECA: 62 cm/sec LEFT ICA: 110/32 cm/sec CCA: 32/67 cm/sec SYSTOLIC ICA/CCA RATIO:  1.4 ECA: 37 cm/sec RIGHT CAROTID ARTERY: Eccentric partially calcified plaque in the bulb involving the ECA origin. No high-grade stenosis. Normal waveforms and color Doppler signal. Mildly tortuous ICA. RIGHT VERTEBRAL ARTERY:  Normal flow direction and waveform. LEFT CAROTID ARTERY: Intimal thickening in the bulb. No significant  stenosis. Normal waveforms and color Doppler signal. Tortuous ICA. LEFT VERTEBRAL ARTERY:  Normal flow direction and waveform. Upper extremity blood pressures: RIGHT: 138/73 LEFT: 145/74 IMPRESSION: 1. Right carotid bifurcation plaque resulting in less than 50% diameter stenosis. 2.  Antegrade bilateral vertebral arterial flow. Electronically Signed   By: Lucrezia Europe M.D.   On: 07/15/2018 15:04    Assessment & Plan:   There are no diagnoses linked to this encounter.   No orders of the defined types were placed in this encounter.    Follow-up: No follow-ups on file.  [name], MD

## 2019-02-26 NOTE — Assessment & Plan Note (Signed)
Prilosec 

## 2019-02-26 NOTE — Assessment & Plan Note (Addendum)
better on Florinef, however c/o side effects (shaky, tightness in chest, dizziness, nausea) randomly... Has issues even w/a lesser dose  We will try Hydrocort now. Start Hydrocortisone 10 mg 1/4 or 1/2 tablet every morning

## 2019-02-26 NOTE — Patient Instructions (Signed)
Start Hydrocortisone 10 mg 1/4 or 1/2 tablet every morning

## 2019-02-26 NOTE — Assessment & Plan Note (Signed)
D/c florinef

## 2019-02-26 NOTE — Assessment & Plan Note (Signed)
Risks of  Hydrocortisone use discussed

## 2019-02-27 ENCOUNTER — Telehealth: Payer: Self-pay | Admitting: Family

## 2019-02-27 NOTE — Telephone Encounter (Signed)
-----   Message from [name], MD sent at 02/26/2019  8:23 PM EDT ----- I am confused by what she is currently taking  I saw her in clnic in 2017 IF she is symptomatic I would try 2.5 midodrine at 8 and 1 and see how she does. I cannot make anymore recommendations without seeing back in clinic for formal orthostatics ----- Message ----- From: [name], NP Sent: 02/18/2019   1:06 PM EDT To: [name], MD, [name], FNP  Mickel Baas, I am at a loss at to what else we can do.  She seemed to improve in the past with use of a girdle and compression stockings, maintaining hydration. I will send this to Dr. Harrington Challenger for her input. She does have valvular disease.    Sherlyn Hay Gae Bon) ----- Message ----- From: [name], FNP Sent: 02/17/2019   2:12 PM EDT To: [name], NP  Good afternoon-  I was reaching out to you about a mutual patient, Cindy Meza. As you know, she has a history of orthostatic hypotension. She has opted to try try medication since she saw you last. She had to stop Midodrin due to nausea and is now feeling similar side effects with Florinef. Do you have any other thoughts for medication options? She seemed to do better with Midodrin bid as opposed to tid- I had thought 1.25 mg tid as opposed to 2.5 mg. She is definitely anxious and this may be contributing to a lot of her side effects. Any thoughts you have for treatments or alternatives would really be appreciated.  Thank you- Jodi Mourning, FNP [place] [facility]

## 2019-02-27 NOTE — Telephone Encounter (Signed)
Spoke with patient and info given 

## 2019-02-27 NOTE — Telephone Encounter (Signed)
Please let her know that Dr. Harrington Challenger at cardiology is recommending in office visit for more formal testing. She recommended Midodrine again but since the patient felt so bad on this medication, would not re-start. She needs to call Dr. Alan Ripper office and schedule a follow-up.

## 2019-02-28 NOTE — Progress Notes (Signed)
Cardiology Office Note   Date:  03/02/2019   ID:  Cindy Meza, DOB [birth year], MRN [id number]  PCP:  Cassandria Anger, MD  Cardiologist:   Dorris Carnes, MD   Pt presents for f/u  Of hypotension      History of Present Illness: Cindy Meza is a 83 y.o. female with a history o fatigue  I saw her in clinic back in Oct 2017  Hx of orthostatic intolerance    Also a hx of mitral regurg   GERD, asthma  Myovue in 2017 normal   Echo with LVEF 50 to 55% with mild diastolic dysfunction and mod MR   Another lexiscan was done in Dec 2019 No ischemia   She was last seen in cardiology clinic in July 2020    Said she was doing good at that time     SInce July visit she has been seen by A [name] 4 times She was started on midodrine 2.5 mg daily   She was switched to Florinef 0.05 mg q am    Last week she was taken off and started on hydrocortisone 2.5 mg daily  Note CBC, TSH and cortisol normal     The patient says over the past few months she has felt weak   Brings in extensive BP log   IN the winter her BP was 110s to 140   Rare 90s   Since then her BP has come down to 100s to 110s   Some BP in 80s   She notes dizzienss at times  No syncope  No CP   No palpitations   Appetite is fair   She says she is drinking adaquate fluids    Urine is pale most of the time Bowels with out change   SOme nausea at time s Outpatient Medications Prior to Visit  Medication Sig Dispense Refill  . ADVAIR HFA 115-21 MCG/ACT inhaler INHALE 2 PUFFS INTO THE LUNGS TWICE DAILY 12 g 1  . aspirin 81 MG tablet Take 81 mg by mouth daily.    . Calcium-Magnesium-Vitamin D (CALCIUM 500 PO) Take 1 each by mouth daily.    . Cholecalciferol 25 MCG (1000 UT) tablet Take 2 tablets (2,000 Units total) by mouth daily. 100 tablet 5  . CVS OMEGA-3 KRILL OIL 500 MG CAPS Take 500 capsules by mouth daily.    . fluticasone (FLONASE) 50 MCG/ACT nasal spray Place 2 sprays into both nostrils daily. 16 g 6  . hydrocortisone (CORTEF)  10 MG tablet Take 1 tablet (10 mg total) by mouth daily. 30 tablet 5  . loratadine (CLARITIN) 10 MG tablet Take 10 mg by mouth daily.     Marland Kitchen omeprazole (PRILOSEC) 40 MG capsule TAKE 1 CAPSULE(40 MG) BY MOUTH DAILY 90 capsule 3  . albuterol (PROVENTIL HFA;VENTOLIN HFA) 108 (90 Base) MCG/ACT inhaler Inhale 1-2 puffs into the lungs every 6 (six) hours. (Patient not taking: Reported on 03/02/2019) 1 Inhaler 1   No facility-administered medications prior to visit.      Allergies:   Fosamax [alendronate sodium], Gabapentin, Lidocaine, Midodrine, Risedronate sodium, Shellfish allergy, and Statins   Past Medical History:  Diagnosis Date  . Allergy   . Anemia   . Asthma    Dr. Melvyn Novas  . Blood transfusion without reported diagnosis   . Breast cyst    Left  . Cataract    Dr. Gershon Crane  . Complication of anesthesia    ' I HAD HALLUCINATIONS WHEN THEY DID  MY HIP "  . GERD (gastroesophageal reflux disease)   . Heart murmur   . Hemorrhoids   . History of colonic polyps [name]   hx of adenomas (small) 1998  . OA (osteoarthritis)   . Osteopenia   . PAC (premature atrial contraction)   . Vitamin D deficiency     Past Surgical History:  Procedure Laterality Date  . ABDOMINAL HYSTERECTOMY     complete  . APPENDECTOMY    . BREAST CYST EXCISION    . CATARACT EXTRACTION, BILATERAL     04/23/18 left eye, 04/02/18 right eye  . COLONOSCOPY  Multiple   Adenomatous colon polyps  . ESOPHAGOGASTRODUODENOSCOPY  2010  . INTRAMEDULLARY (IM) NAIL INTERTROCHANTERIC Right 06/13/2016   Procedure: INTRAMEDULLARY (IM) NAIL INTERTROCHANTRIC;  Surgeon: Tania Ade, MD;  Location: [place];  Service: Orthopedics;  Laterality: Right;  . TONSILLECTOMY       Social History:  The patient  reports that she has never smoked. She has never used smokeless tobacco. She reports current alcohol use. She reports that she does not use drugs.   Family History:  The patient's family history includes Diabetes (age of onset:  16) in her mother; Hypertension in an other family member.  No history of CAD known    Mother had PPM    ROS:  Please see the history of present illness. All other systems are reviewed and  Negative to the above problem except as noted.    PHYSICAL EXAM: VS:  BP 138/80   Pulse 70   Ht 5\' 2"  (1.575 m)   Wt 110 lb 1.9 oz (50 kg)   BMI 20.14 kg/m    BP laying 158/88   P 66  Sitting  153/82  P 74   [place] 127/78  P 68   Standing 4 min 131/78  P 85  GEN: Thin 83 yo , in no acute distress  HEENT: normal  Neck: no JVD, carotid bruits, or masses Cardiac: RRR; no murmurs, rubs, or gallops,no edema  Respiratory:  clear to auscultation bilaterally, normal work of breathing GI: soft, nontender, nondistended, + BS  No hepatomegaly  MS: no deformity Moving all extremities   Skin: warm and dry, no rash Neuro:  Strength and sensation are intact Psych: euthymic mood, full affect   EKG:  EKG is not ordered today.   Lipid Panel    Component Value Date/Time   CHOL 258 (H) 04/10/2018 1207   TRIG 68.0 04/10/2018 1207   HDL 119.60 04/10/2018 1207   CHOLHDL 2 04/10/2018 1207   VLDL 13.6 04/10/2018 1207   LDLCALC 124 (H) 04/10/2018 1207   LDLDIRECT 104.0 03/17/2013 1026      Wt Readings from Last 3 Encounters:  03/02/19 110 lb 1.9 oz (50 kg)  02/26/19 111 lb (50.3 kg)  02/17/19 111 lb 0.6 oz (50.4 kg)      ASSESSMENT AND PLAN:  1  Orthostatic hypotensin   Pt is orthostatic on exam    Cortisol is normal   Looking back at her meds I would recomm retrying florinef but this time as bid (8 and 2 PM)   WIll need to follow K Encouraged increased fluid and salt intake   SPANX   Elevate head of bed    Stay walking     WIll need to watch for hypertension     Check BMET in 10 days for K     2  CAD {laquing of aorta and coronary arteries  Normal myovue  in Dec 2019   No symtpoms of angina  3  Hyperlipidemia\Has been on in past   Not eager to take.  Will follow       Current medicines are  reviewed at length with the patient today.  The patient does not have concerns regarding medicines.   Signed, Dorris Carnes, MD  03/02/2019 8:46 AM    [facility] [address] [place], [place],   [address] Phone: [phone]; Fax: [phone]

## 2019-03-02 ENCOUNTER — Encounter: Payer: Self-pay | Admitting: Internal Medicine

## 2019-03-02 ENCOUNTER — Other Ambulatory Visit: Payer: Self-pay

## 2019-03-02 ENCOUNTER — Ambulatory Visit (INDEPENDENT_AMBULATORY_CARE_PROVIDER_SITE_OTHER): Payer: Medicare Other | Admitting: Internal Medicine

## 2019-03-02 VITALS — BP 138/80 | HR 70 | Ht 62.0 in | Wt 110.1 lb

## 2019-03-02 DIAGNOSIS — I951 Orthostatic hypotension: Secondary | ICD-10-CM | POA: Diagnosis not present

## 2019-03-02 LAB — URINALYSIS
Bilirubin, UA: NEGATIVE
Glucose, UA: NEGATIVE
Ketones, UA: NEGATIVE
Leukocytes,UA: NEGATIVE
Nitrite, UA: NEGATIVE
Protein,UA: NEGATIVE
RBC, UA: NEGATIVE
Specific Gravity, UA: 1.008 (ref 1.005–1.030)
Urobilinogen, Ur: 0.2 mg/dL (ref 0.2–1.0)
pH, UA: 6.5 (ref 5.0–7.5)

## 2019-03-02 MED ORDER — FLUDROCORTISONE ACETATE 0.1 MG PO TABS: 0.0500 mg | ORAL_TABLET | Freq: Two times a day (BID) | ORAL | 6 refills | Status: DC

## 2019-03-02 NOTE — Patient Instructions (Addendum)
Medication Instructions:  Your physician has recommended you make the following change in your medication:  1.) stop hydrocortisone 2.) start florinef 0.1 --take half tablet (.05 mg) in am and afternoon  If you need a refill on your cardiac medications before your next appointment, please call your pharmacy.   Lab work: Today: urinalysis On 03/11/19 -- come for blood work only Artist)   If you have labs (blood work) drawn today and your tests are completely normal, you will receive your results only by: Marland Kitchen MyChart Message (if you have MyChart) OR . A paper copy in the mail If you have any lab test that is abnormal or we need to change your treatment, we will call you to review the results.  Testing/Procedures: none  Follow-Up: As scheduled.  See below. Any Other Special Instructions Will Be Listed Below (If Applicable).

## 2019-03-03 ENCOUNTER — Telehealth: Payer: Self-pay | Admitting: *Deleted

## 2019-03-03 NOTE — Telephone Encounter (Signed)
-----   Message from [name], MD sent at 03/02/2019 11:18 PM EDT ----- Urine appears dilute   Keep up with fluids    Add salt as we spoke in clinic

## 2019-03-03 NOTE — Telephone Encounter (Signed)
Pt has been notified of lab results by phone with verbal understanding. Pt advised of recommendations to keep up with her fluids and ok to add Na as discussed at ov yesterday. Pt did ask if Dr. Harrington Challenger has s/w Dr. Alain Marion in regards to if she needs to keep the appt 03/12/19 with PCP since she just saw Dr. Harrington Challenger yesterday. I stated I did not have information in regards to appt with PCP. I assured the pt I will pass my notes onto Brooke Dare RN to discuss w/Dr. Harrington Challenger and call back with appt recommendation. Pt thanked me for my call and my help. Patient notified of result.  Please refer to phone note from today for complete details.   Julaine Hua, [facility] 03/03/2019 4:49 PM

## 2019-03-04 NOTE — Telephone Encounter (Signed)
Called patient and reviewed that when she was in the office, she discussed that she was considering cancelling the appt with PCP but may want to keep it anyway in order to get her flu vaccine. She wondered if Dr. Harrington Challenger communicated with Dr. Alain Marion. I adv that ov notes were sent to him.

## 2019-03-06 ENCOUNTER — Telehealth: Payer: Self-pay | Admitting: *Deleted

## 2019-03-06 NOTE — Telephone Encounter (Signed)
I called pt- she wants to cancel her 03/12/19 OV with PCP since she is coming back for her physical with him on 04/14/19 and is seeing Dr. Harrington Challenger on 03/23/19.

## 2019-03-06 NOTE — Telephone Encounter (Signed)
Copied from [place] [id number]. Topic: General - Other >> Mar 06, 2019  1:42 PM Burchel, Abbi R wrote: Pt requesting a call back from [facility] re: upcoming appt w/Dr [name]  [phone]

## 2019-03-11 ENCOUNTER — Other Ambulatory Visit: Payer: Medicare Other | Admitting: *Deleted

## 2019-03-11 ENCOUNTER — Other Ambulatory Visit: Payer: Self-pay

## 2019-03-11 DIAGNOSIS — I951 Orthostatic hypotension: Secondary | ICD-10-CM

## 2019-03-12 ENCOUNTER — Ambulatory Visit: Payer: Medicare Other | Admitting: Internal Medicine

## 2019-03-12 ENCOUNTER — Other Ambulatory Visit: Payer: Self-pay | Admitting: Internal Medicine

## 2019-03-12 LAB — BASIC METABOLIC PANEL
BUN/Creatinine Ratio: 10 — ABNORMAL LOW (ref 12–28)
BUN: 8 mg/dL (ref 8–27)
CO2: 25 mmol/L (ref 20–29)
Calcium: 9.3 mg/dL (ref 8.7–10.3)
Chloride: 106 mmol/L (ref 96–106)
Creatinine, Ser: 0.79 mg/dL (ref 0.57–1.00)
GFR calc Af Amer: 81 mL/min/{1.73_m2} (ref >59)
GFR calc non Af Amer: 70 mL/min/{1.73_m2} (ref >59)
Glucose: 81 mg/dL (ref 65–99)
Potassium: 4.1 mmol/L (ref 3.5–5.2)
Sodium: 141 mmol/L (ref 134–144)

## 2019-03-20 NOTE — Progress Notes (Signed)
Cardiology Office Note   Date:  03/23/2019   ID:  Cindy Meza, DOB Nov 12, 1935, MRN [id number]  PCP:  Cassandria Anger, MD  Cardiologist:   Dorris Carnes, MD   Pt presents for f/u of orthostatic hypotenisin     History of Present Illness: Cindy Meza is a 83 y.o. female with a history  fatigue  I saw her in clinic back in Oct 2017  Hx of orthostatic intolerance    Also a hx of mitral regurg   GERD, asthma  Myovue in 2017 normal   Echo with LVEF 50 to 55% with mild diastolic dysfunction and mod MR   Another lexiscan was done in Dec 2019 No ischemia   She was last seen in cardiology clinic in July 2020    Said she was doing good at that time     SInce July visit she has been seen by A [name] 4 times She was started on midodrine 2.5 mg daily   She was switched to Florinef 0.05 mg q am    She was taken off and started on hydrocortisone 2.5 mg daily  Note CBC, TSH and cortisol normal    When I saw her in August she complained of feeling week   BP log from how showed BP had dipped at times into the 80s  No syncope   Some nausea   I recomm she add florinef back to regimen 0.05 q 8 am and 2 PM   She did this until 9/5 and then cut back to 1/4 tab (0.025 ) bid   Her bp reading attually seemed a little higher on lower dose   110s to 130s  Lowest was 90s    She still notes chest tighenss  SOme dizziness   Not too actvie   Is trying to drink more fluids  Also wearing girdle    Outpatient Medications Prior to Visit  Medication Sig Dispense Refill  . ADVAIR HFA 115-21 MCG/ACT inhaler INHALE 2 PUFFS INTO THE LUNGS TWICE DAILY (Patient taking differently: Inhale 1 puff into the lungs daily. ) 12 g 1  . aspirin 81 MG tablet Take 81 mg by mouth daily.    . Calcium-Magnesium-Vitamin D (CALCIUM 500 PO) Take 1 each by mouth daily.    . Cholecalciferol 25 MCG (1000 UT) tablet Take 2 tablets (2,000 Units total) by mouth daily. 100 tablet 5  . CVS OMEGA-3 KRILL OIL 500 MG CAPS Take 500 capsules  by mouth daily.    . fludrocortisone (FLORINEF) 0.1 MG tablet Take 0.5 tablets (0.05 mg total) by mouth 2 (two) times daily. 30 tablet 6  . fluticasone (FLONASE) 50 MCG/ACT nasal spray Place 2 sprays into both nostrils daily. (Patient taking differently: Place 2 sprays into both nostrils daily as needed for allergies. ) 16 g 6  . loratadine (CLARITIN) 10 MG tablet Take 10 mg by mouth daily.     Marland Kitchen omeprazole (PRILOSEC) 40 MG capsule TAKE 1 CAPSULE(40 MG) BY MOUTH DAILY 90 capsule 3   No facility-administered medications prior to visit.      Allergies:   Fosamax [alendronate sodium], Gabapentin, Lidocaine, Midodrine, Risedronate sodium, Shellfish allergy, and Statins   Past Medical History:  Diagnosis Date  . Allergy   . Anemia   . Asthma    Dr. Melvyn Novas  . Blood transfusion without reported diagnosis   . Breast cyst    Left  . Cataract    Dr. Gershon Crane  . Complication of anesthesia    '  I HAD HALLUCINATIONS WHEN THEY DID MY HIP "  . GERD (gastroesophageal reflux disease)   . Heart murmur   . Hemorrhoids   . History of colonic polyps [name]   hx of adenomas (small) 1998  . OA (osteoarthritis)   . Osteopenia   . PAC (premature atrial contraction)   . Vitamin D deficiency     Past Surgical History:  Procedure Laterality Date  . ABDOMINAL HYSTERECTOMY     complete  . APPENDECTOMY    . BREAST CYST EXCISION    . CATARACT EXTRACTION, BILATERAL     04/23/18 left eye, 04/02/18 right eye  . COLONOSCOPY  Multiple   Adenomatous colon polyps  . ESOPHAGOGASTRODUODENOSCOPY  2010  . INTRAMEDULLARY (IM) NAIL INTERTROCHANTERIC Right 06/13/2016   Procedure: INTRAMEDULLARY (IM) NAIL INTERTROCHANTRIC;  Surgeon: Tania Ade, MD;  Location: [place];  Service: Orthopedics;  Laterality: Right;  . TONSILLECTOMY       Social History:  The patient  reports that she has never smoked. She has never used smokeless tobacco. She reports current alcohol use. She reports that she does not use drugs.    Family History:  The patient's family history includes Diabetes (age of onset: 58) in her mother; Hypertension in an other family member.  No history of CAD known    Mother had PPM    ROS:  Please see the history of present illness. All other systems are reviewed and  Negative to the above problem except as noted.    PHYSICAL EXAM: VS:  BP 138/79   Pulse 83   Ht 5\' 2"  (1.575 m)   Wt 111 lb (50.3 kg)   BMI 20.30 kg/m    Laying   138/79  P 76   Sitting 133/74  P 86 Stan[place] 109/66   P 89   Standing 112/73  P 96    BP laying 158/88   P 66  Sitting  153/82  P 74   [place] 127/78  P 68   Standing 4 min 131/78  P 85  GEN: Thin 83 yo , in no acute distress  HEENT: normal  Neck: JVP is normal  No carotid bruits, or masses Cardiac: RRR; no murmurs, rubs, or gallops,no edema  Respiratory:  clear to auscultation bilaterally, normal work of breathing GI: soft, nontender, nondistended, + BS  No hepatomegaly  MS: no deformity Moving all extremities   Skin: warm and dry, no rash Neuro:  Strength and sensation are intact Psych: euthymic mood, full affect   EKG:  EKG is not ordered today.   Lipid Panel    Component Value Date/Time   CHOL 258 (H) 04/10/2018 1207   TRIG 68.0 04/10/2018 1207   HDL 119.60 04/10/2018 1207   CHOLHDL 2 04/10/2018 1207   VLDL 13.6 04/10/2018 1207   LDLCALC 124 (H) 04/10/2018 1207   LDLDIRECT 104.0 03/17/2013 1026      Wt Readings from Last 3 Encounters:  03/23/19 111 lb (50.3 kg)  03/02/19 110 lb 1.9 oz (50 kg)  02/26/19 111 lb (50.3 kg)      ASSESSMENT AND PLAN:  1  Orthostatic hypotensin   Pt remains orthostatic BP is a little higher in general on her log  Will check UA today   Increase back to 0.05 at 9 and 2   Encouraged continued fluid and salt She does not want to try midodrine again  2  CAD Plaquing of aorta and coronary arteries  Normal myovue in Dec 2019  No symtpoms of angina  3  Hyperlipidemia\Has been on statin in past   Not eager  to take.  Will follow for now    Current medicines are reviewed at length with the patient today.  The patient does not have concerns regarding medicines.   Signed, Dorris Carnes, MD  03/23/2019 12:01 PM    [facility] [place], [place], [place]  [address] Phone: [phone]; Fax: [phone]

## 2019-03-23 ENCOUNTER — Encounter: Payer: Self-pay | Admitting: Internal Medicine

## 2019-03-23 ENCOUNTER — Ambulatory Visit (INDEPENDENT_AMBULATORY_CARE_PROVIDER_SITE_OTHER): Payer: Medicare Other | Admitting: Internal Medicine

## 2019-03-23 ENCOUNTER — Other Ambulatory Visit: Payer: Self-pay

## 2019-03-23 VITALS — BP 138/79 | HR 83 | Ht 62.0 in | Wt 111.0 lb

## 2019-03-23 DIAGNOSIS — I951 Orthostatic hypotension: Secondary | ICD-10-CM | POA: Diagnosis not present

## 2019-03-23 LAB — URINALYSIS
Bilirubin, UA: NEGATIVE
Glucose, UA: NEGATIVE
Ketones, UA: NEGATIVE
Leukocytes,UA: NEGATIVE
Nitrite, UA: NEGATIVE
Protein,UA: NEGATIVE
RBC, UA: NEGATIVE
Specific Gravity, UA: 1.01 (ref 1.005–1.030)
Urobilinogen, Ur: 0.2 mg/dL (ref 0.2–1.0)
pH, UA: 6 (ref 5.0–7.5)

## 2019-03-23 NOTE — Patient Instructions (Addendum)
Medication Instructions:  Go back up to 1/2 tablet twice a day of fludrocortisone (around 9am, 2pm) If you need a refill on your cardiac medications before your next appointment, please call your pharmacy.   Lab work: Urinalysis today  If you have labs (blood work) drawn today and your tests are completely normal, you will receive your results only by: Marland Kitchen MyChart Message (if you have MyChart) OR . A paper copy in the mail If you have any lab test that is abnormal or we need to change your treatment, we will call you to review the results.  Testing/Procedures: none  Follow-Up: In December --see details below. Any Other Special Instructions Will Be Listed Below (If Applicable).

## 2019-03-26 ENCOUNTER — Telehealth: Payer: Self-pay

## 2019-03-26 NOTE — Telephone Encounter (Signed)
The patient has been notified of the result and verbalized understanding.  All questions (if any) were answered. Wilma Flavin, RN 03/26/2019 10:52 AM

## 2019-04-09 LAB — HM DEXA SCAN: HM Dexa Scan: -3.4

## 2019-04-09 LAB — HM MAMMOGRAPHY

## 2019-04-14 ENCOUNTER — Ambulatory Visit (INDEPENDENT_AMBULATORY_CARE_PROVIDER_SITE_OTHER): Payer: Medicare Other | Admitting: Internal Medicine

## 2019-04-14 ENCOUNTER — Encounter: Payer: Self-pay | Admitting: Internal Medicine

## 2019-04-14 ENCOUNTER — Other Ambulatory Visit: Payer: Self-pay

## 2019-04-14 VITALS — BP 120/74 | HR 58 | Temp 97.9°F | Ht 62.0 in | Wt 112.0 lb

## 2019-04-14 DIAGNOSIS — R42 Dizziness and giddiness: Secondary | ICD-10-CM

## 2019-04-14 DIAGNOSIS — I951 Orthostatic hypotension: Secondary | ICD-10-CM

## 2019-04-14 DIAGNOSIS — Z0000 Encounter for general adult medical examination without abnormal findings: Secondary | ICD-10-CM

## 2019-04-14 DIAGNOSIS — Z0001 Encounter for general adult medical examination with abnormal findings: Secondary | ICD-10-CM

## 2019-04-14 DIAGNOSIS — Z23 Encounter for immunization: Secondary | ICD-10-CM | POA: Diagnosis not present

## 2019-04-14 DIAGNOSIS — E559 Vitamin D deficiency, unspecified: Secondary | ICD-10-CM

## 2019-04-14 NOTE — Assessment & Plan Note (Signed)
We discussed age appropriate health related issues, including available/recomended screening tests and vaccinations. We discussed a need for adhering to healthy diet and exercise. Labs were ordered to be later reviewed . All questions were answered.  Flu shot

## 2019-04-14 NOTE — Progress Notes (Signed)
Subjective:  Patient ID: Cindy Meza, female    DOB: [birth year]  Age: 83 y.o. MRN: [id number]  CC: No chief complaint on file.   HPI Cindy Meza presents for a well exam C/o low BP on some mornings with SBP 68-89. The pt saw Dr Harrington Challenger  Outpatient Medications Prior to Visit  Medication Sig Dispense Refill  . ADVAIR HFA 115-21 MCG/ACT inhaler INHALE 2 PUFFS INTO THE LUNGS TWICE DAILY (Patient taking differently: Inhale 1 puff into the lungs daily. ) 12 g 1  . aspirin 81 MG tablet Take 81 mg by mouth daily.    . Calcium-Magnesium-Vitamin D (CALCIUM 500 PO) Take 1 each by mouth daily.    . Cholecalciferol 25 MCG (1000 UT) tablet Take 2 tablets (2,000 Units total) by mouth daily. 100 tablet 5  . CVS OMEGA-3 KRILL OIL 500 MG CAPS Take 500 capsules by mouth daily.    . fludrocortisone (FLORINEF) 0.1 MG tablet Take 0.5 tablets (0.05 mg total) by mouth 2 (two) times daily. 30 tablet 6  . fluticasone (FLONASE) 50 MCG/ACT nasal spray Place 2 sprays into both nostrils daily. (Patient taking differently: Place 2 sprays into both nostrils daily as needed for allergies. ) 16 g 6  . loratadine (CLARITIN) 10 MG tablet Take 10 mg by mouth daily.     Marland Kitchen omeprazole (PRILOSEC) 40 MG capsule TAKE 1 CAPSULE(40 MG) BY MOUTH DAILY 90 capsule 3   No facility-administered medications prior to visit.     ROS: Review of Systems  Constitutional: Positive for fatigue. Negative for activity change, appetite change, chills and unexpected weight change.  HENT: Negative for congestion, mouth sores and sinus pressure.   Eyes: Negative for visual disturbance.  Respiratory: Negative for cough and chest tightness.   Gastrointestinal: Negative for abdominal pain and nausea.  Genitourinary: Negative for difficulty urinating, frequency and vaginal pain.  Musculoskeletal: Negative for back pain and gait problem.  Skin: Negative for pallor and rash.  Neurological: Positive for light-headedness. Negative for dizziness,  tremors, weakness, numbness and headaches.  Psychiatric/Behavioral: Negative for confusion, sleep disturbance and suicidal ideas.    Objective:  BP 120/74 (BP Location: Left Arm, Patient Position: Sitting, Cuff Size: Normal)   Pulse (!) 58   Temp 97.9 F (36.6 C) (Oral)   Ht 5\' 2"  (1.575 m)   Wt 112 lb (50.8 kg)   SpO2 99%   BMI 20.49 kg/m   BP Readings from Last 3 Encounters:  04/14/19 120/74  03/23/19 138/79  03/02/19 138/80    Wt Readings from Last 3 Encounters:  04/14/19 112 lb (50.8 kg)  03/23/19 111 lb (50.3 kg)  03/02/19 110 lb 1.9 oz (50 kg)    Physical Exam Constitutional:      General: She is not in acute distress.    Appearance: She is well-developed.  HENT:     Head: Normocephalic.     Right Ear: External ear normal.     Left Ear: External ear normal.     Nose: Nose normal.  Eyes:     General:        Right eye: No discharge.        Left eye: No discharge.     Conjunctiva/sclera: Conjunctivae normal.     Pupils: Pupils are equal, round, and reactive to light.  Neck:     Musculoskeletal: Normal range of motion and neck supple.     Thyroid: No thyromegaly.     Vascular: No JVD.  Trachea: No tracheal deviation.  Cardiovascular:     Rate and Rhythm: Normal rate and regular rhythm.     Heart sounds: Normal heart sounds.  Pulmonary:     Effort: No respiratory distress.     Breath sounds: No stridor. No wheezing.  Abdominal:     General: Bowel sounds are normal. There is no distension.     Palpations: Abdomen is soft. There is no mass.     Tenderness: There is no abdominal tenderness. There is no guarding or rebound.  Musculoskeletal:        General: No tenderness.  Lymphadenopathy:     Cervical: No cervical adenopathy.  Skin:    Findings: No erythema or rash.  Neurological:     Cranial Nerves: No cranial nerve deficit.     Motor: No abnormal muscle tone.     Coordination: Coordination normal.     Deep Tendon Reflexes: Reflexes normal.   Psychiatric:        Behavior: Behavior normal.        Thought Content: Thought content normal.        Judgment: Judgment normal.     Lab Results  Component Value Date   WBC 6.4 01/19/2019   HGB 11.7 (L) 01/19/2019   HCT 34.9 (L) 01/19/2019   PLT 258.0 01/19/2019   GLUCOSE 81 03/11/2019   CHOL 258 (H) 04/10/2018   TRIG 68.0 04/10/2018   HDL 119.60 04/10/2018   LDLDIRECT 104.0 03/17/2013   LDLCALC 124 (H) 04/10/2018   ALT 11 04/10/2018   AST 20 04/10/2018   NA 141 03/11/2019   K 4.1 03/11/2019   CL 106 03/11/2019   CREATININE 0.79 03/11/2019   BUN 8 03/11/2019   CO2 25 03/11/2019   TSH 1.52 01/19/2019   INR 1.04 06/12/2016   HGBA1C 4.9 04/09/2017    US Carotid Duplex Bilateral  Result Date: 07/15/2018 CLINICAL DATA:  Dizziness EXAM: BILATERAL CAROTID DUPLEX ULTRASOUND TECHNIQUE: Pearline Cables scale imaging, color Doppler and duplex ultrasound were performed of bilateral carotid and vertebral arteries in the neck. COMPARISON:  None. FINDINGS: Criteria: Quantification of carotid stenosis is based on velocity parameters that correlate the residual internal carotid diameter with NASCET-based stenosis levels, using the diameter of the distal internal carotid lumen as the denominator for stenosis measurement. The following velocity measurements were obtained: RIGHT ICA: 77/23 cm/sec CCA: [id number] cm/sec SYSTOLIC ICA/CCA RATIO:  1.0 ECA: 62 cm/sec LEFT ICA: 110/32 cm/sec CCA: [id number] cm/sec SYSTOLIC ICA/CCA RATIO:  1.4 ECA: 37 cm/sec RIGHT CAROTID ARTERY: Eccentric partially calcified plaque in the bulb involving the ECA origin. No high-grade stenosis. Normal waveforms and color Doppler signal. Mildly tortuous ICA. RIGHT VERTEBRAL ARTERY:  Normal flow direction and waveform. LEFT CAROTID ARTERY: Intimal thickening in the bulb. No significant stenosis. Normal waveforms and color Doppler signal. Tortuous ICA. LEFT VERTEBRAL ARTERY:  Normal flow direction and waveform. Upper extremity blood pressures: RIGHT:  138/73 LEFT: 145/74 IMPRESSION: 1. Right carotid bifurcation plaque resulting in less than 50% diameter stenosis. 2.  Antegrade bilateral vertebral arterial flow. Electronically Signed   By: Lucrezia Europe M.D.   On: 07/15/2018 15:04    Assessment & Plan:   There are no diagnoses linked to this encounter.   No orders of the defined types were placed in this encounter.    Follow-up: No follow-ups on file.  [name], MD

## 2019-04-14 NOTE — Patient Instructions (Signed)
Take 1st Florinef at 6-7 am with a glass of water

## 2019-04-14 NOTE — Assessment & Plan Note (Signed)
Vit D 

## 2019-04-14 NOTE — Assessment & Plan Note (Signed)
Take 1st Florinef at 6-7 am with a glass of water

## 2019-04-14 NOTE — Addendum Note (Signed)
Addended by: Karren Cobble on: 04/14/2019 04:28 PM   Modules accepted: Orders

## 2019-05-01 ENCOUNTER — Encounter: Payer: Self-pay | Admitting: Internal Medicine

## 2019-06-13 NOTE — Progress Notes (Signed)
Cardiology Office Note   Date:  06/15/2019   ID:  Cindy Meza, DOB Dec 15, 1935, MRN [id number]  PCP:  Cassandria Anger, MD  Cardiologist:   Dorris Carnes, MD   Pt presents for f/u of orthostatic hypotenisin     History of Present Illness: Cindy Meza is a 83 y.o. female with a history  fatigue  I saw her in clinic back in Oct 2017  Hx of orthostatic intolerance    Also a hx of mitral regurg   GERD, asthma  Myovue in 2017 normal   Echo with LVEF 50 to 55% with mild diastolic dysfunction and mod MR   Another lexiscan was done in Dec 2019 No ischemia   She was last seen in cardiology clinic in July 2020    Said she was doing good at that time     SInce July visit she has been seen by A [name] 4 times She was started on midodrine 2.5 mg daily   She was switched to Florinef 0.05 mg q am    She was taken off and started on hydrocortisone 2.5 mg daily  Note CBC, TSH and cortisol normal    When I saw her in August she complained of feeling week   BP log from how showed BP had dipped at times into the 80s  No syncope   Some nausea   I recomm she add florinef back to regimen 0.05 q 8 am and 2 PM   She did this until 9/5 and then cut back to 1/4 tab (0.025 ) bid   Her bp reading attually seemed a little higher on lower dose   110s to 130s  Lowest was 90s    She still notes chest tighenss  SOme dizziness   Not too actvie   Is trying to drink more fluids  Also wearing girdle    I saw the pt in Sept 2020 when I saw her I increased Florinef to 0.052 times per day.  The patient took this starting in September she denies dizziness..  She brings in blood pressures from home they have been anywhere from 22-9 79G systolic pulse in the 92J to 80s.  Patient just had a bone densitometry done and showed osteoporosis.  She has an appointment with [name] this week.  Outpatient Medications Prior to Visit  Medication Sig Dispense Refill  . ADVAIR HFA 115-21 MCG/ACT inhaler INHALE 2 PUFFS  INTO THE LUNGS TWICE DAILY (Patient taking differently: Inhale 1 puff into the lungs daily. ) 12 g 1  . aspirin 81 MG tablet Take 81 mg by mouth daily.    . Calcium-Magnesium-Vitamin D (CALCIUM 500 PO) Take 1 each by mouth daily.    . Cholecalciferol 25 MCG (1000 UT) tablet Take 2 tablets (2,000 Units total) by mouth daily. 100 tablet 5  . CVS OMEGA-3 KRILL OIL 500 MG CAPS Take 500 capsules by mouth daily.    . fludrocortisone (FLORINEF) 0.1 MG tablet Take 0.5 tablets (0.05 mg total) by mouth 2 (two) times daily. 30 tablet 6  . fluticasone (FLONASE) 50 MCG/ACT nasal spray Place 2 sprays into both nostrils daily. (Patient taking differently: Place 2 sprays into both nostrils daily as needed for allergies. ) 16 g 6  . loratadine (CLARITIN) 10 MG tablet Take 10 mg by mouth daily.     Marland Kitchen omeprazole (PRILOSEC) 40 MG capsule TAKE 1 CAPSULE(40 MG) BY MOUTH DAILY 90 capsule 3  . alendronate (FOSAMAX) 70 MG tablet  Take 70 mg by mouth once a week.     No facility-administered medications prior to visit.      Allergies:   Fosamax [alendronate sodium], Gabapentin, Lidocaine, Midodrine, Risedronate sodium, Shellfish allergy, and Statins   Past Medical History:  Diagnosis Date  . Allergy   . Anemia   . Asthma    Dr. Melvyn Novas  . Blood transfusion without reported diagnosis   . Breast cyst    Left  . Cataract    Dr. Gershon Crane  . Complication of anesthesia    ' I HAD HALLUCINATIONS WHEN THEY DID MY HIP "  . GERD (gastroesophageal reflux disease)   . Heart murmur   . Hemorrhoids   . History of colonic polyps [name]   hx of adenomas (small) 1998  . OA (osteoarthritis)   . Osteopenia   . PAC (premature atrial contraction)   . Vitamin D deficiency     Past Surgical History:  Procedure Laterality Date  . ABDOMINAL HYSTERECTOMY     complete  . APPENDECTOMY    . BREAST CYST EXCISION    . CATARACT EXTRACTION, BILATERAL     04/23/18 left eye, 04/02/18 right eye  . COLONOSCOPY  Multiple   Adenomatous  colon polyps  . ESOPHAGOGASTRODUODENOSCOPY  2010  . INTRAMEDULLARY (IM) NAIL INTERTROCHANTERIC Right 06/13/2016   Procedure: INTRAMEDULLARY (IM) NAIL INTERTROCHANTRIC;  Surgeon: Tania Ade, MD;  Location: [facility];  Service: Orthopedics;  Laterality: Right;  . TONSILLECTOMY       Social History:  The patient  reports that she has never smoked. She has never used smokeless tobacco. She reports current alcohol use. She reports that she does not use drugs.   Family History:  The patient's family history includes Diabetes (age of onset: 54) in her mother; Hypertension in an other family member.  No history of CAD known    Mother had PPM    ROS:  Please see the history of present illness. All other systems are reviewed and  Negative to the above problem except as noted.    PHYSICAL EXAM: VS:  BP 128/62   Pulse 96   Ht 5' 2"  (1.575 m)   Wt 110 lb 6.4 oz (50.1 kg)   BMI 20.19 kg/m     GEN: Patient is a thin 83 year old in no acute distress  HEENT: normal  Neck: JVP is normal  No carotid bruits, or masses Cardiac: RRR; no murmurs, rubs, or gallops,no edema  Respiratory:  clear to auscultation bilaterally, normal work of breathing GI: soft, nontender, nondistended, + BS  No hepatomegaly  MS: no deformity Moving all extremities   Skin: warm and dry, no rash Neuro:  Strength and sensation are intact Psych: euthymic mood, full affect   EKG:  EKG is ordered today.  Sinus rhythm 96 bpm.  Occasional PACs.  Nonspecific ST changes   Lipid Panel    Component Value Date/Time   CHOL 258 (H) 04/10/2018 1207   TRIG 68.0 04/10/2018 1207   HDL 119.60 04/10/2018 1207   CHOLHDL 2 04/10/2018 1207   VLDL 13.6 04/10/2018 1207   LDLCALC 124 (H) 04/10/2018 1207   LDLDIRECT 104.0 03/17/2013 1026      Wt Readings from Last 3 Encounters:  06/15/19 110 lb 6.4 oz (50.1 kg)  04/14/19 112 lb (50.8 kg)  03/23/19 111 lb (50.3 kg)      ASSESSMENT AND PLAN:  1  Orthostatic hypotensin patient  feeling better.  Denies dizziness.  We will recheck be met today.  2  CAD Plaquing of aorta and coronary arteries  Normal myovue in Dec 2019   no symptoms of angina.   3  Hyperlipidemia\Has been on statin in past   Not eager to take.  Will follow for now    4.  Osteoporosis we will set up for vitamin D level.  I will set to see the patient back in 6 months.  Again she has an appointment later this week with [name].  Current medicines are reviewed at length with the patient today.  The patient does not have concerns regarding medicines.   Signed, Dorris Carnes, MD  06/15/2019 2:51 PM    [facility] [address], [place], [place]  [address] Phone: [phone]; Fax: [phone]

## 2019-06-15 ENCOUNTER — Other Ambulatory Visit: Payer: Self-pay

## 2019-06-15 ENCOUNTER — Encounter: Payer: Self-pay | Admitting: Internal Medicine

## 2019-06-15 ENCOUNTER — Ambulatory Visit (INDEPENDENT_AMBULATORY_CARE_PROVIDER_SITE_OTHER): Payer: Medicare Other | Admitting: Internal Medicine

## 2019-06-15 VITALS — BP 128/62 | HR 96 | Ht 62.0 in | Wt 110.4 lb

## 2019-06-15 DIAGNOSIS — E559 Vitamin D deficiency, unspecified: Secondary | ICD-10-CM | POA: Diagnosis not present

## 2019-06-15 DIAGNOSIS — I951 Orthostatic hypotension: Secondary | ICD-10-CM | POA: Diagnosis not present

## 2019-06-15 NOTE — Patient Instructions (Signed)
Medication Instructions:  No changes *If you need a refill on your cardiac medications before your next appointment, please call your pharmacy*  Lab Work: Today: vit d, bmet If you have labs (blood work) drawn today and your tests are completely normal, you will receive your results only by: Marland Kitchen MyChart Message (if you have MyChart) OR . A paper copy in the mail If you have any lab test that is abnormal or we need to change your treatment, we will call you to review the results.  Testing/Procedures: none  Follow-Up: At [facility], you and your health needs are our priority.  As part of our continuing mission to provide you with exceptional heart care, we have created designated Provider Care Teams.  These Care Teams include your primary Cardiologist (physician) and Advanced Practice Providers (APPs -  Physician Assistants and Nurse Practitioners) who all work together to provide you with the care you need, when you need it.  Your next appointment:   6 month(s)  The format for your next appointment:   In Person  Provider:   You may see Dorris Carnes, MD or one of the following Advanced Practice Providers on your designated Care Team:    Richardson Dopp, PA-C  [place], [place]  [name], NP   Other Instructions

## 2019-06-16 LAB — BASIC METABOLIC PANEL
BUN/Creatinine Ratio: 14 (ref 12–28)
BUN: 11 mg/dL (ref 8–27)
CO2: 24 mmol/L (ref 20–29)
Calcium: 9.6 mg/dL (ref 8.7–10.3)
Chloride: 99 mmol/L (ref 96–106)
Creatinine, Ser: 0.79 mg/dL (ref 0.57–1.00)
GFR calc Af Amer: 81 mL/min/{1.73_m2} (ref >59)
GFR calc non Af Amer: 70 mL/min/{1.73_m2} (ref >59)
Glucose: 111 mg/dL — ABNORMAL HIGH (ref 65–99)
Potassium: 3.8 mmol/L (ref 3.5–5.2)
Sodium: 136 mmol/L (ref 134–144)

## 2019-06-16 LAB — VITAMIN D 25 HYDROXY (VIT D DEFICIENCY, FRACTURES): Vit D, 25-Hydroxy: 40.9 ng/mL (ref 30.0–100.0)

## 2019-06-17 ENCOUNTER — Other Ambulatory Visit: Payer: Self-pay

## 2019-06-17 ENCOUNTER — Encounter: Payer: Self-pay | Admitting: Internal Medicine

## 2019-06-17 ENCOUNTER — Ambulatory Visit (INDEPENDENT_AMBULATORY_CARE_PROVIDER_SITE_OTHER): Payer: Medicare Other | Admitting: Internal Medicine

## 2019-06-17 DIAGNOSIS — I951 Orthostatic hypotension: Secondary | ICD-10-CM

## 2019-06-17 DIAGNOSIS — E559 Vitamin D deficiency, unspecified: Secondary | ICD-10-CM | POA: Diagnosis not present

## 2019-06-17 DIAGNOSIS — W19XXXS Unspecified fall, sequela: Secondary | ICD-10-CM

## 2019-06-17 DIAGNOSIS — M8000XS Age-related osteoporosis with current pathological fracture, unspecified site, sequela: Secondary | ICD-10-CM

## 2019-06-17 NOTE — Progress Notes (Signed)
Subjective:  Patient ID: Cindy Meza, female    DOB: 15-Nov-1935  Age: 83 y.o. MRN: [id number]  CC: No chief complaint on file.   HPI Cindy Meza presents for hypotension, asthma, dizziness f/u Back on Florinef bid - feeling better... F/u osteoporosis    Outpatient Medications Prior to Visit  Medication Sig Dispense Refill  . ADVAIR HFA 115-21 MCG/ACT inhaler INHALE 2 PUFFS INTO THE LUNGS TWICE DAILY (Patient taking differently: Inhale 1 puff into the lungs daily. ) 12 g 1  . alendronate (FOSAMAX) 70 MG tablet Take 70 mg by mouth once a week.    Marland Kitchen aspirin 81 MG tablet Take 81 mg by mouth daily.    . Calcium-Magnesium-Vitamin D (CALCIUM 500 PO) Take 1 each by mouth daily.    . Cholecalciferol 25 MCG (1000 UT) tablet Take 2 tablets (2,000 Units total) by mouth daily. 100 tablet 5  . CVS OMEGA-3 KRILL OIL 500 MG CAPS Take 500 capsules by mouth daily.    . fludrocortisone (FLORINEF) 0.1 MG tablet Take 0.5 tablets (0.05 mg total) by mouth 2 (two) times daily. 30 tablet 6  . fluticasone (FLONASE) 50 MCG/ACT nasal spray Place 2 sprays into both nostrils daily. (Patient taking differently: Place 2 sprays into both nostrils daily as needed for allergies. ) 16 g 6  . loratadine (CLARITIN) 10 MG tablet Take 10 mg by mouth daily.     Marland Kitchen omeprazole (PRILOSEC) 40 MG capsule TAKE 1 CAPSULE(40 MG) BY MOUTH DAILY 90 capsule 3   No facility-administered medications prior to visit.     ROS: Review of Systems  Constitutional: Positive for fatigue. Negative for activity change, appetite change, chills and unexpected weight change.  HENT: Negative for congestion, mouth sores and sinus pressure.   Eyes: Negative for visual disturbance.  Respiratory: Negative for cough and chest tightness.   Gastrointestinal: Negative for abdominal pain and nausea.  Genitourinary: Negative for difficulty urinating, frequency and vaginal pain.  Musculoskeletal: Negative for back pain and gait problem.  Skin:  Negative for pallor and rash.  Neurological: Negative for dizziness, tremors, syncope, weakness, light-headedness, numbness and headaches.  Psychiatric/Behavioral: Negative for confusion, sleep disturbance and suicidal ideas.    Objective:  BP 122/72 (BP Location: Left Arm, Patient Position: Sitting, Cuff Size: Normal)   Pulse 80   Temp 98 F (36.7 C) (Oral)   Ht 5\' 2"  (1.575 m)   Wt 111 lb (50.3 kg)   BMI 20.30 kg/m   BP Readings from Last 3 Encounters:  06/17/19 122/72  06/15/19 128/62  04/14/19 120/74    Wt Readings from Last 3 Encounters:  06/17/19 111 lb (50.3 kg)  06/15/19 110 lb 6.4 oz (50.1 kg)  04/14/19 112 lb (50.8 kg)    Physical Exam Constitutional:      General: She is not in acute distress.    Appearance: She is well-developed.  HENT:     Head: Normocephalic.     Right Ear: External ear normal.     Left Ear: External ear normal.     Nose: Nose normal.  Eyes:     General:        Right eye: No discharge.        Left eye: No discharge.     Conjunctiva/sclera: Conjunctivae normal.     Pupils: Pupils are equal, round, and reactive to light.  Neck:     Musculoskeletal: Normal range of motion and neck supple.     Thyroid: No thyromegaly.  Vascular: No JVD.     Trachea: No tracheal deviation.  Cardiovascular:     Rate and Rhythm: Normal rate and regular rhythm.     Heart sounds: Normal heart sounds.  Pulmonary:     Effort: No respiratory distress.     Breath sounds: No stridor. No wheezing.  Abdominal:     General: Bowel sounds are normal. There is no distension.     Palpations: Abdomen is soft. There is no mass.     Tenderness: There is no abdominal tenderness. There is no guarding or rebound.  Musculoskeletal:        General: No tenderness.  Lymphadenopathy:     Cervical: No cervical adenopathy.  Skin:    Findings: No erythema or rash.  Neurological:     Mental Status: She is oriented to person, place, and time.     Cranial Nerves: No  cranial nerve deficit.     Motor: No abnormal muscle tone.     Coordination: Coordination normal.     Deep Tendon Reflexes: Reflexes normal.  Psychiatric:        Behavior: Behavior normal.        Thought Content: Thought content normal.        Judgment: Judgment normal.   RR w/irreg beats  Lab Results  Component Value Date   WBC 6.4 01/19/2019   HGB 11.7 (L) 01/19/2019   HCT 34.9 (L) 01/19/2019   PLT 258.0 01/19/2019   GLUCOSE 111 (H) 06/15/2019   CHOL 258 (H) 04/10/2018   TRIG 68.0 04/10/2018   HDL 119.60 04/10/2018   LDLDIRECT 104.0 03/17/2013   LDLCALC 124 (H) 04/10/2018   ALT 11 04/10/2018   AST 20 04/10/2018   NA 136 06/15/2019   K 3.8 06/15/2019   CL 99 06/15/2019   CREATININE 0.79 06/15/2019   BUN 11 06/15/2019   CO2 24 06/15/2019   TSH 1.52 01/19/2019   INR 1.04 06/12/2016   HGBA1C 4.9 04/09/2017    US Carotid Duplex Bilateral  Result Date: 07/15/2018 CLINICAL DATA:  Dizziness EXAM: BILATERAL CAROTID DUPLEX ULTRASOUND TECHNIQUE: Pearline Cables scale imaging, color Doppler and duplex ultrasound were performed of bilateral carotid and vertebral arteries in the neck. COMPARISON:  None. FINDINGS: Criteria: Quantification of carotid stenosis is based on velocity parameters that correlate the residual internal carotid diameter with NASCET-based stenosis levels, using the diameter of the distal internal carotid lumen as the denominator for stenosis measurement. The following velocity measurements were obtained: RIGHT ICA: 77/23 cm/sec CCA: [id number] cm/sec SYSTOLIC ICA/CCA RATIO:  1.0 ECA: 62 cm/sec LEFT ICA: 110/32 cm/sec CCA: [id number] cm/sec SYSTOLIC ICA/CCA RATIO:  1.4 ECA: 37 cm/sec RIGHT CAROTID ARTERY: Eccentric partially calcified plaque in the bulb involving the ECA origin. No high-grade stenosis. Normal waveforms and color Doppler signal. Mildly tortuous ICA. RIGHT VERTEBRAL ARTERY:  Normal flow direction and waveform. LEFT CAROTID ARTERY: Intimal thickening in the bulb. No significant  stenosis. Normal waveforms and color Doppler signal. Tortuous ICA. LEFT VERTEBRAL ARTERY:  Normal flow direction and waveform. Upper extremity blood pressures: RIGHT: 138/73 LEFT: 145/74 IMPRESSION: 1. Right carotid bifurcation plaque resulting in less than 50% diameter stenosis. 2.  Antegrade bilateral vertebral arterial flow. Electronically Signed   By: Lucrezia Europe M.D.   On: 07/15/2018 15:04    Assessment & Plan:   There are no diagnoses linked to this encounter.   No orders of the defined types were placed in this encounter.    Follow-up: No follow-ups on file.  [name], MD

## 2019-06-17 NOTE — Patient Instructions (Signed)
[employer] - Cindy Meza; yoga for osteoporosis----   [email or web address]

## 2019-06-17 NOTE — Assessment & Plan Note (Signed)
No recent falls 

## 2019-06-17 NOTE — Assessment & Plan Note (Signed)
Vit D 

## 2019-06-17 NOTE — Assessment & Plan Note (Signed)
On florinef bid 12/20

## 2019-06-17 NOTE — Assessment & Plan Note (Addendum)
back on Florinef per Dr Pamala Hurry [employer] - Christiana Fuchs; yoga for osteoporosis----   [email or web address]

## 2019-07-28 ENCOUNTER — Ambulatory Visit: Payer: Self-pay

## 2019-07-28 NOTE — Telephone Encounter (Signed)
Appointment made for tomorrow 07/29/19.

## 2019-07-28 NOTE — Telephone Encounter (Signed)
  Patient called stating that she has had abdominal pain for 3 days.  She states it comes and goes. It is centered at her umbilicus.  She rates the pain at 4-5. She states that she has not vomited and has had normal BM's.  She also has a fine red rash over both arms and inner legs.  She states she believes this is coming from her medication Prilosec. She has been on and off this medication. Care advice read to patient.  She verbalized understanding.  Call transferred to office for scheduling. Reason for Disposition . [1] MODERATE pain (e.g., interferes with normal activities) AND [2] pain comes and goes (cramps) AND [3] present > 24 hours  (Exception: pain with Vomiting or Diarrhea - see that Guideline)  Answer Assessment - Initial Assessment Questions 1. LOCATION: "Where does it hurt?"      [place] 2. RADIATION: "Does the pain shoot anywhere else?" (e.g., chest, back)     No 3. ONSET: "When did the pain begin?" (e.g., minutes, hours or days ago)      3 days ago 4. SUDDEN: "Gradual or sudden onset?"     gradual 5. PATTERN "Does the pain come and go, or is it constant?"    - If constant: "Is it getting better, staying the same, or worsening?"      (Note: Constant means the pain never goes away completely; most serious pain is constant and it progresses)     - If intermittent: "How long does it last?" "Do you have pain now?"     (Note: Intermittent means the pain goes away completely between bouts)     Comes and goes and about the same 6. SEVERITY: "How bad is the pain?"  (e.g., Scale 1-10; mild, moderate, or severe)   - MILD (1-3): doesn't interfere with normal activities, abdomen soft and not tender to touch    - MODERATE (4-7): interferes with normal activities or awakens from sleep, tender to touch    - SEVERE (8-10): excruciating pain, doubled over, unable to do any normal activities      4-5 7. RECURRENT SYMPTOM: "Have you ever had this type of abdominal pain before?" If so, ask: "When  was the last time?" and "What happened that time?"      no 8. CAUSE: "What do you think is causing the abdominal pain?"    medication 9. RELIEVING/AGGRAVATING FACTORS: "What makes it better or worse?" (e.g., movement, antacids, bowel movement)    no 10. OTHER SYMPTOMS: "Has there been any vomiting, diarrhea, constipation, or urine problems?"      Rash on arms red rash fine bumps and inner legs 11. PREGNANCY: "Is there any chance you are pregnant?" "When was your last menstrual period?"       N/A  Protocols used: ABDOMINAL PAIN - [facility]

## 2019-07-29 ENCOUNTER — Ambulatory Visit (INDEPENDENT_AMBULATORY_CARE_PROVIDER_SITE_OTHER): Payer: Medicare Other | Admitting: Internal Medicine

## 2019-07-29 ENCOUNTER — Encounter: Payer: Self-pay | Admitting: Internal Medicine

## 2019-07-29 DIAGNOSIS — R21 Rash and other nonspecific skin eruption: Secondary | ICD-10-CM

## 2019-07-29 DIAGNOSIS — R1030 Lower abdominal pain, unspecified: Secondary | ICD-10-CM | POA: Diagnosis not present

## 2019-07-29 DIAGNOSIS — M8000XS Age-related osteoporosis with current pathological fracture, unspecified site, sequela: Secondary | ICD-10-CM | POA: Diagnosis not present

## 2019-07-29 MED ORDER — DIPHENHYDRAMINE HCL 12.5 MG PO CHEW: 12.5000 mg | CHEWABLE_TABLET | Freq: Four times a day (QID) | ORAL | 1 refills | Status: DC | PRN

## 2019-07-29 MED ORDER — TRIAMCINOLONE ACETONIDE 0.1 % EX CREA: 1.0000 "application " | TOPICAL_CREAM | Freq: Two times a day (BID) | CUTANEOUS | 1 refills | Status: DC

## 2019-07-29 NOTE — Progress Notes (Signed)
Virtual Visit via Telephone Note  I connected with [name] on 07/29/19 at 10:20 AM EST by telephone and verified that I am speaking with the correct person using two identifiers.   I discussed the limitations, risks, security and privacy concerns of performing an evaluation and management service by telephone and the availability of in person appointments. I also discussed with the patient that there may be a patient responsible charge related to this service. The patient expressed understanding and agreed to proceed.   History of Present Illness:   Cindy Meza is complaining of body and extremity rash of 3 days duration.  The rash has probably started after her last dose of Fosamax.  She is complaining of the abdominal discomfort mainly in the upper parts of her abdomen. Observations/Objective:  She sounds normal on the phone Assessment and Plan: See assessment and plan  Follow Up Instructions:    I discussed the assessment and treatment plan with the patient. The patient was provided an opportunity to ask questions and all were answered. The patient agreed with the plan and demonstrated an understanding of the instructions.   The patient was advised to call back or seek an in-person evaluation if the symptoms worsen or if the condition fails to improve as anticipated.  I provided 21 minutes of non-face-to-face time during this encounter.   [name], MD

## 2019-07-29 NOTE — Assessment & Plan Note (Signed)
D/c Fosamax due to rash Benadryl, Triamc cream RTC 2 wks

## 2019-07-29 NOTE — Assessment & Plan Note (Signed)
Worse Hold Fosamax

## 2019-08-02 ENCOUNTER — Ambulatory Visit: Payer: Medicare Other | Attending: Internal Medicine

## 2019-08-02 DIAGNOSIS — Z23 Encounter for immunization: Secondary | ICD-10-CM | POA: Insufficient documentation

## 2019-08-03 NOTE — Progress Notes (Signed)
   Covid-19 Vaccination Clinic  Name:  Cindy Meza    MRN: [id number] DOB: Oct 13, 1935  08/02/2019  Cindy Meza was observed post Covid-19 immunization for 15 minutes without incidence. She was provided with Vaccine Information Sheet and instruction to access the V-Safe system.   Cindy Meza was instructed to call 911 with any severe reactions post vaccine: Marland Kitchen Difficulty breathing  . Swelling of your face and throat  . A fast heartbeat  . A bad rash all over your body  . Dizziness and weakness    Immunizations Administered    Name Date Dose VIS Date Route   Moderna COVID-19 Vaccine 08/02/2019  4:35 PM 0.5 mL 06/09/2019 Intramuscular   Manufacturer: Levan Hurst   LotJE:[id number]   [id number]      Documented on behalf of: C. Hubbard

## 2019-08-12 ENCOUNTER — Other Ambulatory Visit: Payer: Self-pay

## 2019-08-12 ENCOUNTER — Ambulatory Visit (INDEPENDENT_AMBULATORY_CARE_PROVIDER_SITE_OTHER): Payer: Medicare Other | Admitting: Internal Medicine

## 2019-08-12 ENCOUNTER — Encounter: Payer: Self-pay | Admitting: Internal Medicine

## 2019-08-12 DIAGNOSIS — I951 Orthostatic hypotension: Secondary | ICD-10-CM

## 2019-08-12 DIAGNOSIS — J4520 Mild intermittent asthma, uncomplicated: Secondary | ICD-10-CM

## 2019-08-12 DIAGNOSIS — R21 Rash and other nonspecific skin eruption: Secondary | ICD-10-CM

## 2019-08-12 DIAGNOSIS — M8000XS Age-related osteoporosis with current pathological fracture, unspecified site, sequela: Secondary | ICD-10-CM

## 2019-08-12 DIAGNOSIS — E559 Vitamin D deficiency, unspecified: Secondary | ICD-10-CM

## 2019-08-12 NOTE — Assessment & Plan Note (Signed)
Vit D Calcium

## 2019-08-12 NOTE — Assessment & Plan Note (Signed)
OK to re-start Fosamax - risks - pains, rash - stop if problems

## 2019-08-12 NOTE — Assessment & Plan Note (Signed)
back on Fosamax per Dr Pamala Hurry Ras was a reaction to unknown agent OK to re-start Fosamax - risks - pains, rash - stop if problems

## 2019-08-12 NOTE — Progress Notes (Signed)
Subjective:  Patient ID: Cindy Meza, female    DOB: 1936/05/08  Age: 84 y.o. MRN: [id number]  CC: No chief complaint on file.   HPI JOUA VANDERKOLK presents for rash - resolved. Fosamax - not taking.  Follow-up on orthostatic hypotension, asthma.   Outpatient Medications Prior to Visit  Medication Sig Dispense Refill  . ADVAIR HFA 115-21 MCG/ACT inhaler INHALE 2 PUFFS INTO THE LUNGS TWICE DAILY (Patient taking differently: Inhale 1 puff into the lungs daily. ) 12 g 1  . aspirin 81 MG tablet Take 81 mg by mouth daily.    . Calcium-Magnesium-Vitamin D (CALCIUM 500 PO) Take 1 each by mouth daily.    . Cholecalciferol 25 MCG (1000 UT) tablet Take 2 tablets (2,000 Units total) by mouth daily. 100 tablet 5  . [facility] OMEGA-3 KRILL OIL 500 MG CAPS Take 500 capsules by mouth daily.    . diphenhydrAMINE (BENADRYL) 12.5 MG chewable tablet Chew 1 tablet (12.5 mg total) by mouth 4 (four) times daily as needed for itching or allergies. 30 tablet 1  . fludrocortisone (FLORINEF) 0.1 MG tablet Take 0.5 tablets (0.05 mg total) by mouth 2 (two) times daily. 30 tablet 6  . fluticasone (FLONASE) 50 MCG/ACT nasal spray Place 2 sprays into both nostrils daily. (Patient taking differently: Place 2 sprays into both nostrils daily as needed for allergies. ) 16 g 6  . loratadine (CLARITIN) 10 MG tablet Take 10 mg by mouth daily.     Marland Kitchen omeprazole (PRILOSEC) 40 MG capsule TAKE 1 CAPSULE(40 MG) BY MOUTH DAILY 90 capsule 3  . Polyethylene Glycol 3350 (MIRALAX PO) Take by mouth.    . triamcinolone cream (KENALOG) 0.1 % Apply 1 application topically 2 (two) times daily. 80 g 1  . alendronate (FOSAMAX) 70 MG tablet Take 70 mg by mouth once a week.     No facility-administered medications prior to visit.    ROS: Review of Systems  Constitutional: Negative for activity change, appetite change, chills, fatigue and unexpected weight change.  HENT: Negative for congestion, mouth sores and sinus pressure.   Eyes:  Negative for visual disturbance.  Respiratory: Negative for cough and chest tightness.   Gastrointestinal: Negative for abdominal pain and nausea.  Genitourinary: Negative for difficulty urinating, frequency and vaginal pain.  Musculoskeletal: Negative for back pain and gait problem.  Skin: Negative for pallor and rash.  Neurological: Negative for dizziness, tremors, weakness, numbness and headaches.  Psychiatric/Behavioral: Negative for confusion, sleep disturbance and suicidal ideas.    Objective:  BP 134/70 (BP Location: Right Arm, Patient Position: Sitting, Cuff Size: Normal)   Pulse (!) 55   Temp 98.5 F (36.9 C) (Oral)   Ht 5\' 2"  (1.575 m)   Wt 110 lb (49.9 kg)   SpO2 99%   BMI 20.12 kg/m   BP Readings from Last 3 Encounters:  08/12/19 134/70  06/17/19 122/72  06/15/19 128/62    Wt Readings from Last 3 Encounters:  08/12/19 110 lb (49.9 kg)  06/17/19 111 lb (50.3 kg)  06/15/19 110 lb 6.4 oz (50.1 kg)    Physical Exam Constitutional:      General: She is not in acute distress.    Appearance: She is well-developed.  HENT:     Head: Normocephalic.     Right Ear: External ear normal.     Left Ear: External ear normal.     Nose: Nose normal.  Eyes:     General:  Right eye: No discharge.        Left eye: No discharge.     Conjunctiva/sclera: Conjunctivae normal.     Pupils: Pupils are equal, round, and reactive to light.  Neck:     Thyroid: No thyromegaly.     Vascular: No JVD.     Trachea: No tracheal deviation.  Cardiovascular:     Rate and Rhythm: Normal rate and regular rhythm.     Heart sounds: Normal heart sounds.  Pulmonary:     Effort: No respiratory distress.     Breath sounds: No stridor. No wheezing.  Abdominal:     General: Bowel sounds are normal. There is no distension.     Palpations: Abdomen is soft. There is no mass.     Tenderness: There is no abdominal tenderness. There is no guarding or rebound.  Musculoskeletal:        General:  No tenderness.     Cervical back: Normal range of motion and neck supple.  Lymphadenopathy:     Cervical: No cervical adenopathy.  Skin:    Findings: No erythema or rash.  Neurological:     Cranial Nerves: No cranial nerve deficit.     Motor: No abnormal muscle tone.     Coordination: Coordination normal.     Deep Tendon Reflexes: Reflexes normal.  Psychiatric:        Behavior: Behavior normal.        Thought Content: Thought content normal.        Judgment: Judgment normal.     Lab Results  Component Value Date   WBC 6.4 01/19/2019   HGB 11.7 (L) 01/19/2019   HCT 34.9 (L) 01/19/2019   PLT 258.0 01/19/2019   GLUCOSE 111 (H) 06/15/2019   CHOL 258 (H) 04/10/2018   TRIG 68.0 04/10/2018   HDL 119.60 04/10/2018   LDLDIRECT 104.0 03/17/2013   LDLCALC 124 (H) 04/10/2018   ALT 11 04/10/2018   AST 20 04/10/2018   NA 136 06/15/2019   K 3.8 06/15/2019   CL 99 06/15/2019   CREATININE 0.79 06/15/2019   BUN 11 06/15/2019   CO2 24 06/15/2019   TSH 1.52 01/19/2019   INR 1.04 06/12/2016   HGBA1C 4.9 04/09/2017    US Carotid Duplex Bilateral  Result Date: 07/15/2018 CLINICAL DATA:  Dizziness EXAM: BILATERAL CAROTID DUPLEX ULTRASOUND TECHNIQUE: Pearline Cables scale imaging, color Doppler and duplex ultrasound were performed of bilateral carotid and vertebral arteries in the neck. COMPARISON:  None. FINDINGS: Criteria: Quantification of carotid stenosis is based on velocity parameters that correlate the residual internal carotid diameter with NASCET-based stenosis levels, using the diameter of the distal internal carotid lumen as the denominator for stenosis measurement. The following velocity measurements were obtained: RIGHT ICA: 77/23 cm/sec CCA: [id number] cm/sec SYSTOLIC ICA/CCA RATIO:  1.0 ECA: 62 cm/sec LEFT ICA: 110/32 cm/sec CCA: [id number] cm/sec SYSTOLIC ICA/CCA RATIO:  1.4 ECA: 37 cm/sec RIGHT CAROTID ARTERY: Eccentric partially calcified plaque in the bulb involving the ECA origin. No high-grade  stenosis. Normal waveforms and color Doppler signal. Mildly tortuous ICA. RIGHT VERTEBRAL ARTERY:  Normal flow direction and waveform. LEFT CAROTID ARTERY: Intimal thickening in the bulb. No significant stenosis. Normal waveforms and color Doppler signal. Tortuous ICA. LEFT VERTEBRAL ARTERY:  Normal flow direction and waveform. Upper extremity blood pressures: RIGHT: 138/73 LEFT: 145/74 IMPRESSION: 1. Right carotid bifurcation plaque resulting in less than 50% diameter stenosis. 2.  Antegrade bilateral vertebral arterial flow. Electronically Signed   By: Eden Emms.D.  On: 07/15/2018 15:04    Assessment & Plan:   There are no diagnoses linked to this encounter.   No orders of the defined types were placed in this encounter.    Follow-up: No follow-ups on file.  [name], MD

## 2019-08-18 ENCOUNTER — Ambulatory Visit: Payer: Medicare Other | Attending: Internal Medicine

## 2019-08-18 DIAGNOSIS — Z20822 Contact with and (suspected) exposure to covid-19: Secondary | ICD-10-CM

## 2019-08-19 LAB — NOVEL CORONAVIRUS, NAA: SARS-CoV-2, NAA: NOT DETECTED

## 2019-08-21 ENCOUNTER — Encounter: Payer: Self-pay | Admitting: Internal Medicine

## 2019-08-21 NOTE — Assessment & Plan Note (Signed)
Continue with Advair

## 2019-08-21 NOTE — Assessment & Plan Note (Signed)
Continue current therapy with Florinef

## 2019-08-24 ENCOUNTER — Ambulatory Visit: Payer: Medicare Other | Admitting: Internal Medicine

## 2019-08-30 ENCOUNTER — Ambulatory Visit: Payer: Medicare Other | Attending: Internal Medicine

## 2019-08-30 ENCOUNTER — Other Ambulatory Visit: Payer: Self-pay

## 2019-08-30 DIAGNOSIS — Z23 Encounter for immunization: Secondary | ICD-10-CM

## 2019-08-30 NOTE — Progress Notes (Signed)
   Covid-19 Vaccination Clinic  Name:  Cindy Meza    MRN: [id number] DOB: [birth year]  08/30/2019  [name] was observed post Covid-19 immunization for 15 minutes without incidence. She was provided with Vaccine Information Sheet and instruction to access the V-Safe system.   [name] was instructed to call 911 with any severe reactions post vaccine: Marland Kitchen Difficulty breathing  . Swelling of your face and throat  . A fast heartbeat  . A bad rash all over your body  . Dizziness and weakness    Immunizations Administered    Name Date Dose VIS Date Route   Moderna COVID-19 Vaccine 08/30/2019  3:06 PM 0.5 mL 06/09/2019 Intramuscular   Manufacturer: Moderna   Lot: [id number]   [id number]

## 2019-09-15 ENCOUNTER — Ambulatory Visit: Payer: Medicare Other | Admitting: Internal Medicine

## 2019-09-16 ENCOUNTER — Ambulatory Visit (INDEPENDENT_AMBULATORY_CARE_PROVIDER_SITE_OTHER): Payer: Medicare Other | Admitting: Internal Medicine

## 2019-09-16 ENCOUNTER — Encounter: Payer: Self-pay | Admitting: Internal Medicine

## 2019-09-16 ENCOUNTER — Other Ambulatory Visit: Payer: Self-pay

## 2019-09-16 ENCOUNTER — Other Ambulatory Visit (INDEPENDENT_AMBULATORY_CARE_PROVIDER_SITE_OTHER): Payer: Medicare Other

## 2019-09-16 VITALS — BP 128/76 | HR 80 | Temp 97.4°F | Ht 62.0 in | Wt 109.0 lb

## 2019-09-16 DIAGNOSIS — K5909 Other constipation: Secondary | ICD-10-CM

## 2019-09-16 DIAGNOSIS — R1033 Periumbilical pain: Secondary | ICD-10-CM | POA: Diagnosis not present

## 2019-09-16 DIAGNOSIS — M810 Age-related osteoporosis without current pathological fracture: Secondary | ICD-10-CM | POA: Diagnosis not present

## 2019-09-16 DIAGNOSIS — R634 Abnormal weight loss: Secondary | ICD-10-CM

## 2019-09-16 DIAGNOSIS — K219 Gastro-esophageal reflux disease without esophagitis: Secondary | ICD-10-CM

## 2019-09-16 LAB — CBC WITH DIFFERENTIAL/PLATELET
Basophils Absolute: 0 10*3/uL (ref 0.0–0.1)
Basophils Relative: 0.8 % (ref 0.0–3.0)
Eosinophils Absolute: 0.1 10*3/uL (ref 0.0–0.7)
Eosinophils Relative: 1.7 % (ref 0.0–5.0)
HCT: 33.2 % — ABNORMAL LOW (ref 36.0–46.0)
Hemoglobin: 11.1 g/dL — ABNORMAL LOW (ref 12.0–15.0)
Lymphocytes Relative: 38.4 % (ref 12.0–46.0)
Lymphs Abs: 2.3 10*3/uL (ref 0.7–4.0)
MCHC: 33.6 g/dL (ref 30.0–36.0)
MCV: 92.8 fl (ref 78.0–100.0)
Monocytes Absolute: 0.5 10*3/uL (ref 0.1–1.0)
Monocytes Relative: 8.3 % (ref 3.0–12.0)
Neutro Abs: 3 10*3/uL (ref 1.4–7.7)
Neutrophils Relative %: 50.8 % (ref 43.0–77.0)
Platelets: 266 10*3/uL (ref 150.0–400.0)
RBC: 3.57 Mil/uL — ABNORMAL LOW (ref 3.87–5.11)
RDW: 12.9 % (ref 11.5–15.5)
WBC: 5.9 10*3/uL (ref 4.0–10.5)

## 2019-09-16 LAB — COMPREHENSIVE METABOLIC PANEL
ALT: 12 U/L (ref 0–35)
AST: 17 U/L (ref 0–37)
Albumin: 3.9 g/dL (ref 3.5–5.2)
Alkaline Phosphatase: 50 U/L (ref 39–117)
BUN: 14 mg/dL (ref 6–23)
CO2: 30 mEq/L (ref 19–32)
Calcium: 9.1 mg/dL (ref 8.4–10.5)
Chloride: 103 mEq/L (ref 96–112)
Creatinine, Ser: 0.84 mg/dL (ref 0.40–1.20)
GFR: 78.61 mL/min (ref >60.00)
Glucose, Bld: 128 mg/dL — ABNORMAL HIGH (ref 70–99)
Potassium: 3.6 mEq/L (ref 3.5–5.1)
Sodium: 137 mEq/L (ref 135–145)
Total Bilirubin: 0.4 mg/dL (ref 0.2–1.2)
Total Protein: 7.4 g/dL (ref 6.0–8.3)

## 2019-09-16 LAB — AMYLASE: Amylase: 58 U/L (ref 27–131)

## 2019-09-16 LAB — LIPASE: Lipase: 76 U/L — ABNORMAL HIGH (ref 11.0–59.0)

## 2019-09-16 NOTE — Patient Instructions (Signed)
  Your provider has requested that you go to the basement level for lab work before leaving today. Press "B" on the elevator. The lab is located at the first door on the left as you exit the elevator.   Please take a tablespoon of Benefiber nightly.   You have been scheduled for a CT scan of the abdomen and pelvis at [facility].  You are scheduled on 09/28/2019 at 12:10PM. You should arrive 20 minutes prior to your appointment time for registration. Please follow the written instructions below on the day of your exam:  WARNING: IF YOU ARE ALLERGIC TO IODINE/X-RAY DYE, PLEASE NOTIFY RADIOLOGY IMMEDIATELY AT [phone]! YOU WILL BE GIVEN A 13 HOUR PREMEDICATION PREP.  1) Do not eat anything after 8:10AM (4 hours prior to your test) 2) You have been given 2 bottles of oral contrast to drink. The solution may taste better if refrigerated, but do NOT add ice or any other liquid to this solution. Shake well before drinking.    Drink 1 bottle of contrast @ 10:10AM (2 hours prior to your exam)  Drink 1 bottle of contrast @ 11:10AM (1 hour prior to your exam)  You may take any medications as prescribed with a small amount of water, if necessary. If you take any of the following medications: METFORMIN, GLUCOPHAGE, GLUCOVANCE, AVANDAMET, RIOMET, FORTAMET, [place] MET, JANUMET, GLUMETZA or METAGLIP, you MAY be asked to HOLD this medication 48 hours AFTER the exam.  The purpose of you drinking the oral contrast is to aid in the visualization of your intestinal tract. The contrast solution may cause some diarrhea. Depending on your individual set of symptoms, you may also receive an intravenous injection of x-ray contrast/dye. Plan on being at [facility] for 30 minutes or longer, depending on the type of exam you are having performed.  This test typically takes 30-45 minutes to complete.  If you have any questions regarding your exam or if you need to reschedule, you may call the CT  department at [phone] between the hours of 8:00 am and 5:00 pm, Monday-Friday.  ________________________________________________________________________  I appreciate the opportunity to care for you. Silvano Rusk, MD, [facility]

## 2019-09-16 NOTE — Progress Notes (Signed)
Cindy Meza 84 y.o. [birth year] [id number]  Assessment & Plan:   Encounter Diagnoses  Name Primary?  . Periumbilical abdominal pain Yes  . Loss of weight   . Chronic constipation   . Osteoporosis without current pathological fracture, unspecified osteoporosis type   . Gastroesophageal reflux disease, unspecified whether esophagitis present    I have to think that the Fosamax may be part of the issue.  We discussed stopping that and seeing if the symptoms got better but she is concerned she has lost some weight she is elderly so we will go ahead with a CT of the abdomen and pelvis with contrast.  Benefiber for constipation.  1 tablespoon daily.  This should help hemorrhoids as those tend to bleed when she strains.  Pending these results I think we need to consider stopping the Fosamax to see if the symptoms resolve unless the CT scan points to something. May need to consider an EGD last one was in 2009 with a normal exam and an empiric Maloney dilation for dysphagia symptoms.  I explained that there are other treatments for osteoporosis that could be considered if the Fosamax is bothering her.  Regarding treatment of GERD, I think we may try famotidine instead of Prilosec though in my opinion the studies indicating problems with PPI such as osteoporosis/bone fractures, dementia and kidney failure are really poor studies with correlation which is minimal and there are studies to refute this as well.  That said it is fine to try to get her off of PPI as well.  We will see.   Orders Placed This Encounter  Procedures  . CT Abdomen Pelvis W Contrast  . CBC with Differential/Platelet  . Comprehensive metabolic panel  . Amylase  . Lipase   I appreciate the opportunity to care for this patient. CC: [name], Evie Lacks, MD Dr. Larina Earthly  Subjective:   Chief Complaint: Abdominal pain  HPI Cindy Meza is here at her request because of periumbilical abdominal pain that started a  month or so ago.  It is episodic described as an ache.  It can occur in the day or at night.  It has awakened from sleep.  She is concerned about this.  She does not recall having it before.  Bowel movements are regular though only about 3 or 4 times a week and she feels constipated.  She uses MiraLAX as needed that usually helps within 1 to 2 days.  This is a chronic pattern for her and she has had colonoscopy as recent as 2019 that demonstrated hemorrhoids which still occasionally bleed.  No change in that pattern.  Her weight is down some, 2 pounds on the last 3 but 6 pounds in the last year.  Wt Readings from Last 3 Encounters:  09/16/19 109 lb (49.4 kg)  08/12/19 110 lb (49.9 kg)  06/17/19 111 lb (50.3 kg)   She had seen Dr. Valentino Saxon of gynecology and a bone density study showed severe osteoporosis.  She had been on Fosamax or Actonel a number of years ago, she had a CT scan for "lower abdominal pain" in 2016 and the clinical indications also note that her osteoporosis medicine was thought to be associated with the pain and she was improving off of it.  She does not quite recall that.  She has a history of GERD she transition to H2 blocker with ranitidine and at some point came back to Prilosec.  She is concerned about the potential side effects of Prilosec  such as bone fractures, dementia and kidney failure.  She would rather not take that.  She does not seem to have heartburn now. Allergies  Allergen Reactions  . Fosamax [Alendronate Sodium]     paresthesias  . Gabapentin     dizziness  . Lidocaine Swelling    Of face and lips.  Novocaine does same thing.  . Midodrine     Nausea   . Risedronate Sodium     Knee pain  . Shellfish Allergy Hives  . Statins     REACTION: pains   Current Meds  Medication Sig  . ADVAIR HFA 115-21 MCG/ACT inhaler INHALE 2 PUFFS INTO THE LUNGS TWICE DAILY (Patient taking differently: Inhale 1 puff into the lungs daily. )  . aspirin 81 MG tablet Take 81 mg  by mouth daily.  . Calcium-Magnesium-Vitamin D (CALCIUM 500 PO) Take 1 each by mouth daily.  . Cholecalciferol 25 MCG (1000 UT) tablet Take 2 tablets (2,000 Units total) by mouth daily.  . CVS OMEGA-3 KRILL OIL 500 MG CAPS Take 500 capsules by mouth daily.  . fludrocortisone (FLORINEF) 0.1 MG tablet Take 0.5 tablets (0.05 mg total) by mouth 2 (two) times daily.  Marland Kitchen loratadine (CLARITIN) 10 MG tablet Take 10 mg by mouth daily.   Marland Kitchen omeprazole (PRILOSEC) 40 MG capsule TAKE 1 CAPSULE(40 MG) BY MOUTH DAILY  . Polyethylene Glycol 3350 (MIRALAX PO) Take by mouth.   Past Medical History:  Diagnosis Date  . Allergy   . Anemia   . Asthma    Dr. Melvyn Novas  . Blood transfusion without reported diagnosis   . Breast cyst    Left  . Cataract    Dr. Gershon Crane  . Complication of anesthesia    ' I HAD HALLUCINATIONS WHEN THEY DID MY HIP "  . GERD (gastroesophageal reflux disease)   . Heart murmur   . Hemorrhoids   . History of colonic polyps [name]   hx of adenomas (small) 1998  . OA (osteoarthritis)   . Osteopenia   . PAC (premature atrial contraction)   . Vitamin D deficiency    Past Surgical History:  Procedure Laterality Date  . ABDOMINAL HYSTERECTOMY     complete  . APPENDECTOMY    . BREAST CYST EXCISION    . CATARACT EXTRACTION, BILATERAL     04/23/18 left eye, 04/02/18 right eye  . COLONOSCOPY  Multiple   Adenomatous colon polyps  . ESOPHAGOGASTRODUODENOSCOPY  2010  . INTRAMEDULLARY (IM) NAIL INTERTROCHANTERIC Right 06/13/2016   Procedure: INTRAMEDULLARY (IM) NAIL INTERTROCHANTRIC;  Surgeon: Tania Ade, MD;  Location: [facility];  Service: Orthopedics;  Laterality: Right;  . TONSILLECTOMY     Social History   Social History Narrative   She lives alone in a [place] home.  No children.   Retired Education officer, [employer].   Highest level of education:  Masters degree   family history includes Diabetes (age of onset: 20) in her mother; Hypertension in an other family member.   Review of  Systems  As above Objective:   Physical Exam BP 128/76   Pulse 80   Temp (!) 97.4 F (36.3 C)   Ht 5\' 2"  (1.575 m)   Wt 109 lb (49.4 kg)   BMI 19.94 kg/m  Well-developed well-nourished petite black woman no acute distress Eyes anicteric Lungs clear Heart sounds are normal The abdomen is thin soft with mild to moderate tenderness in the epigastrium

## 2019-09-28 ENCOUNTER — Ambulatory Visit
Admission: RE | Admit: 2019-09-28 | Discharge: 2019-09-28 | Disposition: A | Discharge status: Home / Self Care | Payer: Medicare Other | Source: Ambulatory Visit | Attending: Internal Medicine | Admitting: Internal Medicine

## 2019-09-28 DIAGNOSIS — R1033 Periumbilical pain: Secondary | ICD-10-CM

## 2019-09-28 DIAGNOSIS — R634 Abnormal weight loss: Secondary | ICD-10-CM

## 2019-09-28 MED ORDER — IOPAMIDOL (ISOVUE-300) INJECTION 61%
100.0000 mL | Freq: Once | INTRAVENOUS | Status: AC | PRN
  Administered 2019-09-28: 100 mL via INTRAVENOUS

## 2019-10-26 ENCOUNTER — Encounter: Payer: Self-pay | Admitting: Internal Medicine

## 2019-10-26 ENCOUNTER — Telehealth: Payer: Self-pay

## 2019-10-26 ENCOUNTER — Ambulatory Visit (INDEPENDENT_AMBULATORY_CARE_PROVIDER_SITE_OTHER): Payer: Medicare Other | Admitting: Internal Medicine

## 2019-10-26 ENCOUNTER — Other Ambulatory Visit: Payer: Self-pay

## 2019-10-26 ENCOUNTER — Other Ambulatory Visit: Payer: Self-pay | Admitting: Internal Medicine

## 2019-10-26 DIAGNOSIS — M810 Age-related osteoporosis without current pathological fracture: Secondary | ICD-10-CM

## 2019-10-26 DIAGNOSIS — R5383 Other fatigue: Secondary | ICD-10-CM | POA: Diagnosis not present

## 2019-10-26 DIAGNOSIS — E559 Vitamin D deficiency, unspecified: Secondary | ICD-10-CM | POA: Diagnosis not present

## 2019-10-26 DIAGNOSIS — R202 Paresthesia of skin: Secondary | ICD-10-CM | POA: Diagnosis not present

## 2019-10-26 DIAGNOSIS — R1030 Lower abdominal pain, unspecified: Secondary | ICD-10-CM

## 2019-10-26 DIAGNOSIS — R42 Dizziness and giddiness: Secondary | ICD-10-CM

## 2019-10-26 LAB — BASIC METABOLIC PANEL
BUN: 14 mg/dL (ref 6–23)
CO2: 28 mEq/L (ref 19–32)
Calcium: 9.5 mg/dL (ref 8.4–10.5)
Chloride: 100 mEq/L (ref 96–112)
Creatinine, Ser: 0.85 mg/dL (ref 0.40–1.20)
GFR: 77.52 mL/min (ref >60.00)
Glucose, Bld: 135 mg/dL — ABNORMAL HIGH (ref 70–99)
Potassium: 3.6 mEq/L (ref 3.5–5.1)
Sodium: 135 mEq/L (ref 135–145)

## 2019-10-26 LAB — HEPATIC FUNCTION PANEL
ALT: 13 U/L (ref 0–35)
AST: 22 U/L (ref 0–37)
Albumin: 4.1 g/dL (ref 3.5–5.2)
Alkaline Phosphatase: 51 U/L (ref 39–117)
Bilirubin, Direct: 0 mg/dL (ref 0.0–0.3)
Total Bilirubin: 0.3 mg/dL (ref 0.2–1.2)
Total Protein: 7.3 g/dL (ref 6.0–8.3)

## 2019-10-26 LAB — VITAMIN D 25 HYDROXY (VIT D DEFICIENCY, FRACTURES): VITD: 70.53 ng/mL (ref 30.00–100.00)

## 2019-10-26 LAB — VITAMIN B12: Vitamin B-12: 286 pg/mL (ref 211–911)

## 2019-10-26 MED ORDER — FAMOTIDINE 40 MG PO TABS: 40.0000 mg | ORAL_TABLET | Freq: Every day | ORAL | 3 refills | Status: DC

## 2019-10-26 MED ORDER — BENEFIBER PO TABS: ORAL_TABLET | ORAL | 11 refills | Status: DC

## 2019-10-26 NOTE — Assessment & Plan Note (Signed)
B 12

## 2019-10-26 NOTE — Progress Notes (Signed)
Subjective:  Patient ID: [name], female    DOB: 1935/10/18  Age: 84 y.o. MRN: [id number]  CC: No chief complaint on file.   HPI KALLAN HERRIMAN presents for wt loss, bloating, asthma. The pt stopped Fosamax due to stomach issues  Outpatient Medications Prior to Visit  Medication Sig Dispense Refill  . ADVAIR HFA 115-21 MCG/ACT inhaler INHALE 2 PUFFS INTO THE LUNGS TWICE DAILY (Patient taking differently: Inhale 1 puff into the lungs daily. ) 12 g 1  . aspirin 81 MG tablet Take 81 mg by mouth daily.    . Calcium-Magnesium-Vitamin D (CALCIUM 500 PO) Take 1 each by mouth daily.    . Cholecalciferol 25 MCG (1000 UT) tablet Take 2 tablets (2,000 Units total) by mouth daily. 100 tablet 5  . CVS OMEGA-3 KRILL OIL 500 MG CAPS Take 500 capsules by mouth daily.    . fludrocortisone (FLORINEF) 0.1 MG tablet Take 0.5 tablets (0.05 mg total) by mouth 2 (two) times daily. 30 tablet 6  . fluticasone (FLONASE) 50 MCG/ACT nasal spray Place 2 sprays into both nostrils daily. 16 g 6  . loratadine (CLARITIN) 10 MG tablet Take 10 mg by mouth daily.     Marland Kitchen omeprazole (PRILOSEC) 40 MG capsule TAKE 1 CAPSULE(40 MG) BY MOUTH DAILY 90 capsule 3  . Polyethylene Glycol 3350 (MIRALAX PO) Take by mouth.    . triamcinolone cream (KENALOG) 0.1 % Apply 1 application topically 2 (two) times daily. 80 g 1  . alendronate (FOSAMAX) 70 MG tablet Take 70 mg by mouth once a week. Take with a full glass of water on an empty stomach.     No facility-administered medications prior to visit.    ROS: Review of Systems  Constitutional: Positive for fatigue and unexpected weight change. Negative for activity change, appetite change and chills.  HENT: Negative for congestion, mouth sores and sinus pressure.   Eyes: Negative for visual disturbance.  Respiratory: Negative for cough and chest tightness.   Gastrointestinal: Positive for abdominal pain. Negative for nausea.  Genitourinary: Negative for difficulty urinating,  frequency and vaginal pain.  Musculoskeletal: Positive for arthralgias and back pain. Negative for gait problem.  Skin: Negative for pallor and rash.  Neurological: Negative for dizziness, tremors, weakness, numbness and headaches.  Psychiatric/Behavioral: Negative for confusion and sleep disturbance. The patient is not nervous/anxious.     Objective:  BP 128/76 (BP Location: Right Arm, Patient Position: Sitting, Cuff Size: Normal)   Pulse 80   Temp 97.7 F (36.5 C) (Oral)   Ht 5\' 2"  (1.575 m)   Wt 107 lb (48.5 kg)   SpO2 99%   BMI 19.57 kg/m   BP Readings from Last 3 Encounters:  10/26/19 128/76  09/16/19 128/76  08/12/19 134/70    Wt Readings from Last 3 Encounters:  10/26/19 107 lb (48.5 kg)  09/16/19 109 lb (49.4 kg)  08/12/19 110 lb (49.9 kg)    Physical Exam Constitutional:      General: She is not in acute distress.    Appearance: She is well-developed.  HENT:     Head: Normocephalic.     Right Ear: External ear normal.     Left Ear: External ear normal.     Nose: Nose normal.  Eyes:     General:        Right eye: No discharge.        Left eye: No discharge.     Conjunctiva/sclera: Conjunctivae normal.     Pupils:  Pupils are equal, round, and reactive to light.  Neck:     Thyroid: No thyromegaly.     Vascular: No JVD.     Trachea: No tracheal deviation.  Cardiovascular:     Rate and Rhythm: Normal rate and regular rhythm.     Heart sounds: Normal heart sounds.  Pulmonary:     Effort: No respiratory distress.     Breath sounds: No stridor. No wheezing.  Abdominal:     General: Bowel sounds are normal. There is no distension.     Palpations: Abdomen is soft. There is no mass.     Tenderness: There is no abdominal tenderness. There is no guarding or rebound.  Musculoskeletal:        General: Tenderness present.     Cervical back: Normal range of motion and neck supple.  Lymphadenopathy:     Cervical: No cervical adenopathy.  Skin:    Findings: No  erythema or rash.  Neurological:     Mental Status: She is oriented to person, place, and time.     Cranial Nerves: No cranial nerve deficit.     Motor: No abnormal muscle tone.     Coordination: Coordination normal.     Deep Tendon Reflexes: Reflexes normal.  Psychiatric:        Behavior: Behavior normal.        Thought Content: Thought content normal.        Judgment: Judgment normal.     Lab Results  Component Value Date   WBC 5.9 09/16/2019   HGB 11.1 (L) 09/16/2019   HCT 33.2 (L) 09/16/2019   PLT 266.0 09/16/2019   GLUCOSE 128 (H) 09/16/2019   CHOL 258 (H) 04/10/2018   TRIG 68.0 04/10/2018   HDL 119.60 04/10/2018   LDLDIRECT 104.0 03/17/2013   LDLCALC 124 (H) 04/10/2018   ALT 12 09/16/2019   AST 17 09/16/2019   NA 137 09/16/2019   K 3.6 09/16/2019   CL 103 09/16/2019   CREATININE 0.84 09/16/2019   BUN 14 09/16/2019   CO2 30 09/16/2019   TSH 1.52 01/19/2019   INR 1.04 06/12/2016   HGBA1C 4.9 04/09/2017    CT Abdomen Pelvis W Contrast  Result Date: 09/28/2019 CLINICAL DATA:  Periumbilical pain weight loss.  Constipation. EXAM: CT ABDOMEN AND PELVIS WITH CONTRAST TECHNIQUE: Multidetector CT imaging of the abdomen and pelvis was performed using the standard protocol following bolus administration of intravenous contrast. CONTRAST:  133mL ISOVUE-300 IOPAMIDOL (ISOVUE-300) INJECTION 61% COMPARISON:  04/28/2015 FINDINGS: Lower chest: No acute abnormality. Hepatobiliary: No focal liver abnormality is seen. No gallstones, gallbladder wall thickening, or biliary dilatation. Pancreas: Unremarkable. No pancreatic ductal dilatation or surrounding inflammatory changes. Spleen: Normal in size without focal abnormality. Adrenals/Urinary Tract: Normal appearance of the adrenal glands. No kidney mass or hydronephrosis identified bilaterally urinary bladder unremarkable. Stomach/Bowel: Stomach is within normal limits. The appendix is not confidently identified. No evidence of bowel wall  thickening, distention, or inflammatory changes. Vascular/Lymphatic: Aortic atherosclerosis. No aneurysm. No abdominopelvic adenopathy identified. Reproductive: Status post hysterectomy. No adnexal masses. Other: No abdominal wall hernia or abnormality. No abdominopelvic ascites. Musculoskeletal: Previous ORIF of right femur. Sclerotic lesion identified within the left sacral wing, unchanged. Favor benign bone island. IMPRESSION: 1. No acute findings within the abdomen or pelvis. No mass or adenopathy noted. 2.  Aortic Atherosclerosis (ICD10-I70.0). Electronically Signed   By: Kerby Moors M.D.   On: 09/28/2019 12:52    Assessment & Plan:   There are no diagnoses linked  to this encounter.   No orders of the defined types were placed in this encounter.    Follow-up: No follow-ups on file.  [name], MD

## 2019-10-26 NOTE — Addendum Note (Signed)
Addended by: Trenda Moots on: 0000000 03:26 PM   Modules accepted: Orders

## 2019-10-26 NOTE — Assessment & Plan Note (Signed)
Better on Florinef

## 2019-10-26 NOTE — Assessment & Plan Note (Signed)
CFS 

## 2019-10-26 NOTE — Assessment & Plan Note (Signed)
D/c Prilosec Discussed Protonix

## 2019-10-26 NOTE — Telephone Encounter (Signed)
Please arrange for Prolia shots

## 2019-10-26 NOTE — Assessment & Plan Note (Signed)
D/c Fosamax Start Prolia

## 2019-10-26 NOTE — Assessment & Plan Note (Signed)
On Vit D 

## 2019-10-26 NOTE — Patient Instructions (Signed)
Stop Prilosec Start Famotidine Boost or Ensure daily

## 2019-10-26 NOTE — Assessment & Plan Note (Signed)
Better off Fosamax, off Miralax

## 2019-10-28 ENCOUNTER — Ambulatory Visit: Payer: Medicare Other | Admitting: Internal Medicine

## 2019-10-28 ENCOUNTER — Other Ambulatory Visit: Payer: Self-pay | Admitting: Internal Medicine

## 2019-10-28 MED ORDER — VITAMIN B-12 1000 MCG SL SUBL: 1.0000 | SUBLINGUAL_TABLET | Freq: Every day | SUBLINGUAL | 3 refills | Status: DC

## 2019-11-11 ENCOUNTER — Telehealth: Payer: Self-pay | Admitting: Internal Medicine

## 2019-11-11 ENCOUNTER — Ambulatory Visit: Payer: Medicare Other | Admitting: Internal Medicine

## 2019-11-11 NOTE — Telephone Encounter (Signed)
Patient wanted to know what the comparison between Pepcid and Prilosec.  She just started Pepcid on May 1st.  Please advise.

## 2019-11-12 NOTE — Telephone Encounter (Signed)
° ° °  Please return call to patient °

## 2019-11-12 NOTE — Telephone Encounter (Signed)
LMTCB

## 2019-11-12 NOTE — Telephone Encounter (Signed)
Pt notified Pepcid (famotidine) is an H2-blocker used to treat and prevent recurrence of stomach and duodenal ulcers. Pepcid is also useful in managing heartburn, gastroesophageal reflux disease and Zollinger-Ellison syndrome. Pepcid is available as a generic drug. Prilosec (omeprazole) is a proton pump inhibitor (PPI) used for the treatment of conditions such as ulcers, gastroesophageal reflux disease (GERD), and Zollinger-Ellison syndrome, which are all caused by stomach acid.

## 2019-11-20 ENCOUNTER — Ambulatory Visit: Payer: Medicare Other | Admitting: Internal Medicine

## 2019-11-30 ENCOUNTER — Ambulatory Visit (INDEPENDENT_AMBULATORY_CARE_PROVIDER_SITE_OTHER): Payer: Medicare Other | Admitting: Internal Medicine

## 2019-11-30 ENCOUNTER — Other Ambulatory Visit: Payer: Self-pay

## 2019-11-30 ENCOUNTER — Encounter: Payer: Self-pay | Admitting: Internal Medicine

## 2019-11-30 DIAGNOSIS — M810 Age-related osteoporosis without current pathological fracture: Secondary | ICD-10-CM | POA: Diagnosis not present

## 2019-11-30 DIAGNOSIS — I951 Orthostatic hypotension: Secondary | ICD-10-CM | POA: Diagnosis not present

## 2019-11-30 DIAGNOSIS — K219 Gastro-esophageal reflux disease without esophagitis: Secondary | ICD-10-CM | POA: Diagnosis not present

## 2019-11-30 MED ORDER — RABEPRAZOLE SODIUM 20 MG PO TBEC: 20.0000 mg | DELAYED_RELEASE_TABLET | Freq: Every morning | ORAL | 3 refills | Status: DC

## 2019-11-30 NOTE — Assessment & Plan Note (Signed)
Prolia is in the process of approval

## 2019-11-30 NOTE — Progress Notes (Signed)
Subjective:  Patient ID: Cindy Meza, female    DOB: Mar 23, 1936  Age: 84 y.o. MRN: [id number]  CC: No chief complaint on file.   HPI CLOA BERTZ presents for morning dizziness and nausea sx's, fatigue C/o GERD on Pepcid F/u bone loss    Outpatient Medications Prior to Visit  Medication Sig Dispense Refill  . ADVAIR HFA 115-21 MCG/ACT inhaler INHALE 2 PUFFS INTO THE LUNGS TWICE DAILY (Patient taking differently: Inhale 1 puff into the lungs daily. ) 12 g 1  . aspirin 81 MG tablet Take 81 mg by mouth daily.    . Calcium-Magnesium-Vitamin D (CALCIUM 500 PO) Take 1 each by mouth daily.    . Cholecalciferol 25 MCG (1000 UT) tablet Take 2 tablets (2,000 Units total) by mouth daily. 100 tablet 5  . [facility] OMEGA-3 KRILL OIL 500 MG CAPS Take 500 capsules by mouth daily.    . Cyanocobalamin (VITAMIN B-12) 1000 MCG SUBL Place 1 tablet (1,000 mcg total) under the tongue daily. 100 tablet 3  . famotidine (PEPCID) 40 MG tablet Take 1 tablet (40 mg total) by mouth daily. 90 tablet 3  . fludrocortisone (FLORINEF) 0.1 MG tablet TAKE 1/2 TABLET(0.05 MG) BY MOUTH TWICE DAILY 30 tablet 6  . fluticasone (FLONASE) 50 MCG/ACT nasal spray Place 2 sprays into both nostrils daily. 16 g 6  . loratadine (CLARITIN) 10 MG tablet Take 10 mg by mouth daily.     Marland Kitchen triamcinolone cream (KENALOG) 0.1 % Apply 1 application topically 2 (two) times daily. 80 g 1  . Wheat Dextrin (BENEFIBER) TABS 1 tab qd 42 tablet 11   No facility-administered medications prior to visit.    ROS: Review of Systems  Constitutional: Positive for fatigue. Negative for activity change, appetite change, chills and unexpected weight change.  HENT: Negative for congestion, mouth sores and sinus pressure.   Eyes: Negative for visual disturbance.  Respiratory: Negative for cough and chest tightness.   Gastrointestinal: Positive for nausea. Negative for abdominal pain.  Genitourinary: Negative for difficulty urinating, frequency and  vaginal pain.  Musculoskeletal: Negative for back pain and gait problem.  Skin: Negative for pallor and rash.  Neurological: Positive for weakness and light-headedness. Negative for dizziness, tremors, numbness and headaches.  Psychiatric/Behavioral: Negative for confusion, sleep disturbance and suicidal ideas.    Objective:  BP (!) 146/84 (BP Location: Left Arm, Patient Position: Sitting, Cuff Size: Normal)   Pulse 99   Temp 98 F (36.7 C) (Oral)   Ht 5\' 2"  (1.575 m)   Wt 107 lb (48.5 kg)   SpO2 98%   BMI 19.57 kg/m   BP Readings from Last 3 Encounters:  11/30/19 (!) 146/84  10/26/19 128/76  09/16/19 128/76    Wt Readings from Last 3 Encounters:  11/30/19 107 lb (48.5 kg)  10/26/19 107 lb (48.5 kg)  09/16/19 109 lb (49.4 kg)    Physical Exam Constitutional:      General: She is not in acute distress.    Appearance: She is well-developed.  HENT:     Head: Normocephalic.     Right Ear: External ear normal.     Left Ear: External ear normal.     Nose: Nose normal.  Eyes:     General:        Right eye: No discharge.        Left eye: No discharge.     Conjunctiva/sclera: Conjunctivae normal.     Pupils: Pupils are equal, round, and reactive to light.  Neck:     Thyroid: No thyromegaly.     Vascular: No JVD.     Trachea: No tracheal deviation.  Cardiovascular:     Rate and Rhythm: Normal rate and regular rhythm.     Heart sounds: Normal heart sounds.  Pulmonary:     Effort: No respiratory distress.     Breath sounds: No stridor. No wheezing.  Abdominal:     General: Bowel sounds are normal. There is no distension.     Palpations: Abdomen is soft. There is no mass.     Tenderness: There is no abdominal tenderness. There is no guarding or rebound.  Musculoskeletal:        General: No tenderness.     Cervical back: Normal range of motion and neck supple.  Lymphadenopathy:     Cervical: No cervical adenopathy.  Skin:    Findings: No erythema or rash.    Neurological:     Cranial Nerves: No cranial nerve deficit.     Motor: No abnormal muscle tone.     Coordination: Coordination normal.     Deep Tendon Reflexes: Reflexes normal.  Psychiatric:        Behavior: Behavior normal.        Thought Content: Thought content normal.        Judgment: Judgment normal.   Thin  Lab Results  Component Value Date   WBC 5.9 09/16/2019   HGB 11.1 (L) 09/16/2019   HCT 33.2 (L) 09/16/2019   PLT 266.0 09/16/2019   GLUCOSE 135 (H) 10/26/2019   CHOL 258 (H) 04/10/2018   TRIG 68.0 04/10/2018   HDL 119.60 04/10/2018   LDLDIRECT 104.0 03/17/2013   LDLCALC 124 (H) 04/10/2018   ALT 13 10/26/2019   AST 22 10/26/2019   NA 135 10/26/2019   K 3.6 10/26/2019   CL 100 10/26/2019   CREATININE 0.85 10/26/2019   BUN 14 10/26/2019   CO2 28 10/26/2019   TSH 1.52 01/19/2019   INR 1.04 06/12/2016   HGBA1C 4.9 04/09/2017    CT Abdomen Pelvis W Contrast  Result Date: 09/28/2019 CLINICAL DATA:  Periumbilical pain weight loss.  Constipation. EXAM: CT ABDOMEN AND PELVIS WITH CONTRAST TECHNIQUE: Multidetector CT imaging of the abdomen and pelvis was performed using the standard protocol following bolus administration of intravenous contrast. CONTRAST:  151mL ISOVUE-300 IOPAMIDOL (ISOVUE-300) INJECTION 61% COMPARISON:  04/28/2015 FINDINGS: Lower chest: No acute abnormality. Hepatobiliary: No focal liver abnormality is seen. No gallstones, gallbladder wall thickening, or biliary dilatation. Pancreas: Unremarkable. No pancreatic ductal dilatation or surrounding inflammatory changes. Spleen: Normal in size without focal abnormality. Adrenals/Urinary Tract: Normal appearance of the adrenal glands. No kidney mass or hydronephrosis identified bilaterally urinary bladder unremarkable. Stomach/Bowel: Stomach is within normal limits. The appendix is not confidently identified. No evidence of bowel wall thickening, distention, or inflammatory changes. Vascular/Lymphatic: Aortic  atherosclerosis. No aneurysm. No abdominopelvic adenopathy identified. Reproductive: Status post hysterectomy. No adnexal masses. Other: No abdominal wall hernia or abnormality. No abdominopelvic ascites. Musculoskeletal: Previous ORIF of right femur. Sclerotic lesion identified within the left sacral wing, unchanged. Favor benign bone island. IMPRESSION: 1. No acute findings within the abdomen or pelvis. No mass or adenopathy noted. 2.  Aortic Atherosclerosis (ICD10-I70.0). Electronically Signed   By: Kerby Moors M.D.   On: 09/28/2019 12:52    Assessment & Plan:   There are no diagnoses linked to this encounter.   No orders of the defined types were placed in this encounter.    Follow-up: No follow-ups  on file.  [name], MD

## 2019-11-30 NOTE — Assessment & Plan Note (Signed)
Pepcid alone is not controlling sx's Refractory: Aciphex 20 mg qam; Pepcid qhs

## 2019-11-30 NOTE — Assessment & Plan Note (Signed)
On florinef bid

## 2019-12-04 ENCOUNTER — Other Ambulatory Visit: Payer: Self-pay

## 2019-12-04 ENCOUNTER — Encounter: Payer: Self-pay | Admitting: Internal Medicine

## 2019-12-04 ENCOUNTER — Ambulatory Visit (INDEPENDENT_AMBULATORY_CARE_PROVIDER_SITE_OTHER): Payer: Medicare Other | Admitting: Internal Medicine

## 2019-12-04 VITALS — BP 122/68 | HR 83 | Ht 62.0 in | Wt 106.8 lb

## 2019-12-04 DIAGNOSIS — Z79899 Other long term (current) drug therapy: Secondary | ICD-10-CM

## 2019-12-04 DIAGNOSIS — E785 Hyperlipidemia, unspecified: Secondary | ICD-10-CM

## 2019-12-04 MED ORDER — ROSUVASTATIN CALCIUM 5 MG PO TABS
2.5000 mg | ORAL_TABLET | Freq: Every day | ORAL | 3 refills | Status: DC
Start: 2019-12-04 — End: 2019-12-31

## 2019-12-04 MED ORDER — FLUDROCORTISONE ACETATE 0.1 MG PO TABS: 0.1000 mg | ORAL_TABLET | Freq: Every day | ORAL | 3 refills | Status: DC

## 2019-12-04 NOTE — Progress Notes (Signed)
Cardiology Office Note   Date:  12/04/2019   ID:  Cindy Meza, DOB [birth year], MRN [id number]  PCP:  Cassandria Anger, MD  Cardiologist:   Dorris Carnes, MD   Pt presents for f/u of orthostatic hypotension    History of Present Illness: Cindy Meza is a 84 y.o. female with a history  fatigue and orthostatic intolerance.  ALso a hx of mitral regurgitation, asthma, GERD   Myovue in 2017 and 2019 were normal   Echo with LVEF 50 to 55% with mild diastolic dysfunction and mod MR   She was last seen in cardiology clinic in July 2020    Said she was doing good at that time    She was seen by APlotnikov and started on midodrine, switched to florinef   Hydrocortisone was started   Noted CBC, TSH and cortisol were normal    In Aug 2020 I recomm she go back on florinef.   BP improved some   She complained of some chest tightness and dizziness   In Sept 2020 Florinef was increased to bid.    has an appointment with [name] this week.  Pateint says of late has been feeing tireded and weak in AM   SOB   [place]   Wt down even though intake the same   Reading about osteoporosis  Severe    Was taking fosamax   Taken off of it    BOne denisities in October   Severe   Went back on fosamax   Upset stomach  Got back on   [place]  recomm coming off  See PCP    SOB and weak after going to mailbox   Outpatient Medications Prior to Visit  Medication Sig Dispense Refill  . ADVAIR HFA 115-21 MCG/ACT inhaler INHALE 2 PUFFS INTO THE LUNGS TWICE DAILY 12 g 1  . aspirin 81 MG tablet Take 81 mg by mouth daily.    . Calcium-Magnesium-Vitamin D (CALCIUM 500 PO) Take 1 each by mouth daily.    . Cholecalciferol 25 MCG (1000 UT) tablet Take 2 tablets (2,000 Units total) by mouth daily. 100 tablet 5  . [employer]S OMEGA-3 KRILL OIL 500 MG CAPS Take 500 capsules by mouth daily.    . Cyanocobalamin (VITAMIN B-12) 1000 MCG SUBL Place 1 tablet (1,000 mcg total) under the tongue daily. 100 tablet 3  . fludrocortisone  (FLORINEF) 0.1 MG tablet TAKE 1/2 TABLET(0.05 MG) BY MOUTH TWICE DAILY 30 tablet 6  . fluticasone (FLONASE) 50 MCG/ACT nasal spray Place 2 sprays into both nostrils daily. 16 g 6  . loratadine (CLARITIN) 10 MG tablet Take 10 mg by mouth daily.     . RABEprazole (ACIPHEX) 20 MG tablet Take 1 tablet (20 mg total) by mouth in the morning. 90 tablet 3  . triamcinolone cream (KENALOG) 0.1 % Apply 1 application topically 2 (two) times daily. 80 g 1  . Wheat Dextrin (BENEFIBER) TABS 1 tab qd 42 tablet 11  . famotidine (PEPCID) 40 MG tablet Take 1 tablet (40 mg total) by mouth daily. (Patient not taking: Reported on 12/04/2019) 90 tablet 3   No facility-administered medications prior to visit.     Allergies:   Fosamax [alendronate sodium], Gabapentin, Lidocaine, Midodrine, Risedronate sodium, Shellfish allergy, and Statins   Past Medical History:  Diagnosis Date  . Allergy   . Anemia   . Asthma    Dr. Melvyn Novas  . Blood transfusion without reported diagnosis   . Breast cyst  Left  . Cataract    Dr. Gershon Crane  . Complication of anesthesia    ' I HAD HALLUCINATIONS WHEN THEY DID MY HIP "  . GERD (gastroesophageal reflux disease)   . Heart murmur   . Hemorrhoids   . History of colonic polyps [name]   hx of adenomas (small) 1998  . OA (osteoarthritis)   . Orthostatic hypotension   . Osteopenia   . PAC (premature atrial contraction)   . Vitamin D deficiency     Past Surgical History:  Procedure Laterality Date  . ABDOMINAL HYSTERECTOMY     complete  . APPENDECTOMY    . BREAST CYST EXCISION    . CATARACT EXTRACTION, BILATERAL     04/23/18 left eye, 04/02/18 right eye  . COLONOSCOPY  Multiple   Adenomatous colon polyps  . ESOPHAGOGASTRODUODENOSCOPY  2010  . INTRAMEDULLARY (IM) NAIL INTERTROCHANTERIC Right 06/13/2016   Procedure: INTRAMEDULLARY (IM) NAIL INTERTROCHANTRIC;  Surgeon: Tania Ade, MD;  Location: [facility];  Service: Orthopedics;  Laterality: Right;  . TONSILLECTOMY        Social History:  The patient  reports that she has never smoked. She has never used smokeless tobacco. She reports current alcohol use. She reports that she does not use drugs.   Family History:  The patient's family history includes Diabetes (age of onset: 87) in her mother; Hypertension in an other family member.  No history of CAD known    Mother had PPM    ROS:  Please see the history of present illness. All other systems are reviewed and  Negative to the above problem except as noted.    PHYSICAL EXAM: VS:  BP 122/68   Pulse 83   Ht 5\' 2"  (1.575 m)   Wt 106 lb 12.8 oz (48.4 kg)   SpO2 98%   BMI 19.53 kg/m     GEN: Patient is a thin 84 year old in no acute distress  HEENT: normal  Neck: JVP is normal  No carotid bruits Cardiac: RRR; no murmurs, rubs, or gallops,no edema  Respiratory:  clear to auscultation bilaterally, normal work of breathing GI: soft, nontender, nondistended, + BS  No hepatomegaly  MS: no deformity Moving all extremities   Skin: warm and dry, no rash Neuro:  Strength and sensation are intact Psych: euthymic mood, full affect   EKG:  EKG is not ordered.  Lipid Panel    Component Value Date/Time   CHOL 258 (H) 04/10/2018 1207   TRIG 68.0 04/10/2018 1207   HDL 119.60 04/10/2018 1207   CHOLHDL 2 04/10/2018 1207   VLDL 13.6 04/10/2018 1207   LDLCALC 124 (H) 04/10/2018 1207   LDLDIRECT 104.0 03/17/2013 1026      Wt Readings from Last 3 Encounters:  12/04/19 106 lb 12.8 oz (48.4 kg)  11/30/19 107 lb (48.5 kg)  10/26/19 107 lb (48.5 kg)      ASSESSMENT AND PLAN:  1  Orthostatic hypotension  BP is OK   I did not check orthostatics today   Keep on same regimen  Stay hydrated   Keep adequate salt intake  2  Osteoporosis.  VERY concerned about this and about side effects of meds   This is biggest concern addressed at appt.    Reluctant to take Prolia    Given concerns I would reocmm referral to endocrine for medication and exercise  recommendations  2  CAD Plaquing of aorta and coronary arteries  Normal myovue in Dec 2019  I am not convinced of  any angina   SOB and fatigue comes and goes  Erratic   May reflect fluid balance    3  Hyperlipidemia\Has been on statin in past   DId not want tot ake meds in the past   I would recomm though a trial of Crestor every other day  F/U lipids in 8 wks.   Current medicines are reviewed at length with the patient today.  The patient does not have concerns regarding medicines.   Signed, Dorris Carnes, MD  12/04/2019 2:19 PM    [facility] [address], [place], [place]  [address] Phone: [phone]; Fax: [phone]

## 2019-12-04 NOTE — Patient Instructions (Signed)
Medication Instructions:  Your physician has recommended you make the following change in your medication:  1-START Crestor 2.5 mg by mouth every other day. 2-Decrease Florinef 0.1 mg by mouth daily.  *If you need a refill on your cardiac medications before your next appointment, please call your pharmacy*   Lab Work: Your physician recommends that you return for lab work in: 12 months for fasting lipid panel and AST.  If you have labs (blood work) drawn today and your tests are completely normal, you will receive your results only by: Marland Kitchen MyChart Message (if you have MyChart) OR . A paper copy in the mail If you have any lab test that is abnormal or we need to change your treatment, we will call you to review the results.  Follow-Up: At [facility], you and your health needs are our priority.  As part of our continuing mission to provide you with exceptional heart care, we have created designated Provider Care Teams.  These Care Teams include your primary Cardiologist (physician) and Advanced Practice Providers (APPs -  Physician Assistants and Nurse Practitioners) who all work together to provide you with the care you need, when you need it.  We recommend signing up for the patient portal called "MyChart".  Sign up information is provided on this After Visit Summary.  MyChart is used to connect with patients for Virtual Visits (Telemedicine).  Patients are able to view lab/test results, encounter notes, upcoming appointments, etc.  Non-urgent messages can be sent to your provider as well.   To learn more about what you can do with MyChart, go to [email or web address].    Your next appointment:   6 month(s)  The format for your next appointment:   In Person  Provider:   You may see Dorris Carnes, MD or one of the following Advanced Practice Providers on your designated Care Team:    Richardson Dopp, PA-C  Robbie Lis, [place]    Other Instructions  Please make sure you are walking  and keeping hydrated daily.

## 2019-12-16 ENCOUNTER — Telehealth: Payer: Self-pay

## 2019-12-16 ENCOUNTER — Other Ambulatory Visit: Payer: Self-pay | Admitting: Internal Medicine

## 2019-12-16 NOTE — Telephone Encounter (Signed)
Pt's pharmacy calling to clarify Florinef Rx sent in 12/04/2019.  Pt was previously taking 0.05mg  bid.  New Rx states 0.1mg  daily.  Pt's paperwork states decrease  Florinef 0.1mg  daily however this is not a decrease.  Will forward information to RN covering Dr Harrington Challenger on 12/04/2019 for clarification.  Pharmacy requesting a callback today if possible.

## 2019-12-16 NOTE — Telephone Encounter (Signed)
Cut in 1/2

## 2019-12-16 NOTE — Telephone Encounter (Signed)
Dr. Harrington Challenger wanted patient to take medication florinef once a day not twice a day at the lowest dose medication came in which is 0.1mg . Will forward to Dr. Harrington Challenger for clarification.

## 2019-12-17 MED ORDER — FLUDROCORTISONE ACETATE 0.1 MG PO TABS: 0.0500 mg | ORAL_TABLET | Freq: Every day | ORAL | 3 refills | Status: DC

## 2019-12-17 NOTE — Telephone Encounter (Signed)
Called pharmacy and left a message about dose change. Updated medication list. Called patient to inform her of dose change. Patient verbalized understanding.

## 2019-12-29 ENCOUNTER — Ambulatory Visit: Payer: Medicare Other | Admitting: Internal Medicine

## 2019-12-30 ENCOUNTER — Telehealth: Payer: Self-pay | Admitting: *Deleted

## 2019-12-30 NOTE — Telephone Encounter (Signed)
Okay to take Florinef.  Please see Dr. Jenny Reichmann on Thursday. Thanks,

## 2019-12-30 NOTE — Telephone Encounter (Signed)
FYI: Patient called c/o low blood pressure 106/63, feeling shaky and having balance problems. She is requesting same day office visit. I informed her no available openings as it is the end of the day. OV scheduled with Dr. Jenny Reichmann 12/31/19 @ 9:40 am since PCP is out of office on 12/31/19. I informed pt to drink plenty of fluids, rest and take it easy today/tonight and to call 911, go to UC or ER if her sxs worsen before then.   She also asked if she should take her Florinef now since she has not had a dose today. I informed her to call Dr. Harrington Challenger' office since she prescribes this. Patient states she couldn't make contact with that office. I advised pt to take her usual dose of Florinef and keep OV tomorrow am with Dr. Jenny Reichmann.

## 2019-12-31 ENCOUNTER — Other Ambulatory Visit: Payer: Self-pay

## 2019-12-31 ENCOUNTER — Encounter: Payer: Self-pay | Admitting: Internal Medicine

## 2019-12-31 ENCOUNTER — Ambulatory Visit (INDEPENDENT_AMBULATORY_CARE_PROVIDER_SITE_OTHER): Payer: Medicare Other | Admitting: Internal Medicine

## 2019-12-31 VITALS — BP 92/68 | HR 89 | Temp 98.6°F | Ht 62.0 in | Wt 106.0 lb

## 2019-12-31 DIAGNOSIS — I951 Orthostatic hypotension: Secondary | ICD-10-CM | POA: Diagnosis not present

## 2019-12-31 DIAGNOSIS — F419 Anxiety disorder, unspecified: Secondary | ICD-10-CM | POA: Diagnosis not present

## 2019-12-31 DIAGNOSIS — M810 Age-related osteoporosis without current pathological fracture: Secondary | ICD-10-CM | POA: Diagnosis not present

## 2019-12-31 DIAGNOSIS — K219 Gastro-esophageal reflux disease without esophagitis: Secondary | ICD-10-CM

## 2019-12-31 MED ORDER — CITALOPRAM HYDROBROMIDE 10 MG PO TABS
10.0000 mg | ORAL_TABLET | Freq: Every day | ORAL | 3 refills | Status: DC
Start: 2019-12-31 — End: 2020-03-10

## 2019-12-31 NOTE — Patient Instructions (Addendum)
Please take all new medication as prescribed - the celexa 10 mg per day  I will ask Colletta Maryland in the office to call you about the Prolia start  Please continue all other medications as before, and refills have been done if requested.  Please have the pharmacy call with any other refills you may need.  Please keep your appointments with your specialists as you may have planned  Please make an Appointment to return in 3 months, or sooner if needed, with Dr Alain Marion

## 2019-12-31 NOTE — Progress Notes (Signed)
Subjective:    Patient ID: Cindy Meza, female    DOB: Nov 13, 1935, 84 y.o.   MRN: [id number]  HPI   84 y.o. female with a history  fatigue and orthostatic intolerance.  ALso a hx of mitral regurgitation, asthma, GERD   Myovue in 2017 and 2019 were normal   Echo with LVEF 50 to 55% with mild diastolic dysfunction and mod MR   She was last seen in cardiology clinic in July 2020    Said she was doing good at that time   She was seen by APlotnikov and started on midodrine, switched to florinef   Hydrocortisone was started   Noted CBC, TSH and cortisol were normal    In Aug 2020 I recomm she go back on florinef.   BP improved some   She complained of some chest tightness and dizziness   In Sept 2020 Florinef was increased to bid.   Wt Readings from Last 3 Encounters:  12/31/19 106 lb (48.1 kg)  12/04/19 106 lb 12.8 oz (48.4 kg)  11/30/19 107 lb (48.5 kg)   BP Readings from Last 3 Encounters:  12/31/19 92/68  12/04/19 122/68  11/30/19 (!) 146/84  Seen per cardiology may 28 - to start crestor 5 qd, and decrease florinef to 0.1 qd, with plan for f/u lab at 12 mo, and OV at 6 mo.   No current other vasoactive meds. Today with list of multiple BPs for may 2021 with a few 87 sbp, but usually higher in 90s to 110, still feeling recent at time shaky, nausea, and off balance, and nervous.  Feels better to lie down, worse to stand up and walk to mailbox, has some sob with this as well.  Recent wt ok, but wt last yr was about 10 lbs higher.  Does also have recurring constipation.  Not taking the statin due to allergy per pt to all statin.  She wants to go over what time to take each med today but does not appear to be on time constrained med prescriptions.  Denies worsening reflux, abd pain, dysphagia, n/v, bowel change or blood. Past Medical History:  Diagnosis Date  . Allergy   . Anemia   . Asthma    Dr. Melvyn Novas  . Blood transfusion without reported diagnosis   . Breast cyst    Left  . Cataract     Dr. Gershon Crane  . Complication of anesthesia    ' I HAD HALLUCINATIONS WHEN THEY DID MY HIP "  . GERD (gastroesophageal reflux disease)   . Heart murmur   . Hemorrhoids   . History of colonic polyps [name]   hx of adenomas (small) 1998  . OA (osteoarthritis)   . Orthostatic hypotension   . Osteopenia   . PAC (premature atrial contraction)   . Vitamin D deficiency    Past Surgical History:  Procedure Laterality Date  . ABDOMINAL HYSTERECTOMY     complete  . APPENDECTOMY    . BREAST CYST EXCISION    . CATARACT EXTRACTION, BILATERAL     04/23/18 left eye, 04/02/18 right eye  . COLONOSCOPY  Multiple   Adenomatous colon polyps  . ESOPHAGOGASTRODUODENOSCOPY  2010  . INTRAMEDULLARY (IM) NAIL INTERTROCHANTERIC Right 06/13/2016   Procedure: INTRAMEDULLARY (IM) NAIL INTERTROCHANTRIC;  Surgeon: Tania Ade, MD;  Location: [facility];  Service: Orthopedics;  Laterality: Right;  . TONSILLECTOMY      reports that she has never smoked. She has never used smokeless tobacco. She reports current  alcohol use. She reports that she does not use drugs. family history includes Diabetes (age of onset: 13) in her mother; Hypertension in an other family member. Allergies  Allergen Reactions  . Fosamax [Alendronate Sodium]     Paresthesias, abd pain  . Gabapentin     dizziness  . Lidocaine Swelling    Of face and lips.  Novocaine does same thing.  . Midodrine     Nausea   . Risedronate Sodium     Knee pain  . Shellfish Allergy Hives  . Statins     REACTION: pains   Current Outpatient Medications on File Prior to Visit  Medication Sig Dispense Refill  . ADVAIR HFA 115-21 MCG/ACT inhaler INHALE 2 PUFFS INTO THE LUNGS TWICE DAILY 12 g 1  . aspirin 81 MG tablet Take 81 mg by mouth daily.    . Calcium-Magnesium-Vitamin D (CALCIUM 500 PO) Take 1 each by mouth daily.    . Cholecalciferol 25 MCG (1000 UT) tablet Take 2 tablets (2,000 Units total) by mouth daily. 100 tablet 5  . CVS OMEGA-3 KRILL OIL  500 MG CAPS Take 500 capsules by mouth daily.    . Cyanocobalamin (VITAMIN B-12) 1000 MCG SUBL Place 1 tablet (1,000 mcg total) under the tongue daily. 100 tablet 3  . famotidine (PEPCID) 40 MG tablet     . fludrocortisone (FLORINEF) 0.1 MG tablet Take 0.5 tablets (0.05 mg total) by mouth daily. 45 tablet 3  . fluticasone (FLONASE) 50 MCG/ACT nasal spray Place 2 sprays into both nostrils daily. 16 g 6  . loratadine (CLARITIN) 10 MG tablet Take 10 mg by mouth daily.     . RABEprazole (ACIPHEX) 20 MG tablet Take 1 tablet (20 mg total) by mouth in the morning. 90 tablet 3  . triamcinolone cream (KENALOG) 0.1 % Apply 1 application topically 2 (two) times daily. 80 g 1  . Wheat Dextrin (BENEFIBER) TABS 1 tab qd 42 tablet 11   No current facility-administered medications on file prior to visit.   Review of Systems All otherwise neg per pt    Objective:   Physical Exam BP 92/68 (BP Location: Left Arm, Patient Position: Sitting, Cuff Size: Large)   Pulse 89   Temp 98.6 F (37 C) (Oral)   Ht 5\' 2"  (1.575 m)   Wt 106 lb (48.1 kg)   SpO2 99%   BMI 19.39 kg/m  VS noted,  Constitutional: Pt appears in NAD HENT: Head: NCAT.  Right Ear: External ear normal.  Left Ear: External ear normal.  Eyes: . Pupils are equal, round, and reactive to light. Conjunctivae and EOM are normal Nose: without d/c or deformity Neck: Neck supple. Gross normal ROM Cardiovascular: Normal rate and regular rhythm.   Pulmonary/Chest: Effort normal and breath sounds without rales or wheezing.  Neurological: Pt is alert. At baseline orientation, motor grossly intact Skin: Skin is warm. No rashes, other new lesions, no LE edema Psychiatric: Pt behavior is normal without agitation  All otherwise neg per pt  Lab Results  Component Value Date   WBC 5.9 09/16/2019   HGB 11.1 (L) 09/16/2019   HCT 33.2 (L) 09/16/2019   PLT 266.0 09/16/2019   GLUCOSE 135 (H) 10/26/2019   CHOL 258 (H) 04/10/2018   TRIG 68.0 04/10/2018     HDL 119.60 04/10/2018   LDLDIRECT 104.0 03/17/2013   LDLCALC 124 (H) 04/10/2018   ALT 13 10/26/2019   AST 22 10/26/2019   NA 135 10/26/2019   K 3.6 10/26/2019  CL 100 10/26/2019   CREATININE 0.85 10/26/2019   BUN 14 10/26/2019   CO2 28 10/26/2019   TSH 1.52 01/19/2019   INR 1.04 06/12/2016   HGBA1C 4.9 04/09/2017      Assessment & Plan:

## 2020-01-01 ENCOUNTER — Ambulatory Visit: Payer: Medicare Other | Admitting: Family

## 2020-01-03 ENCOUNTER — Encounter: Payer: Self-pay | Admitting: Internal Medicine

## 2020-01-03 DIAGNOSIS — F419 Anxiety disorder, unspecified: Secondary | ICD-10-CM | POA: Insufficient documentation

## 2020-01-03 NOTE — Assessment & Plan Note (Signed)
D/w pt, encouraged ok for prolia

## 2020-01-03 NOTE — Assessment & Plan Note (Addendum)
Mild worsening recent, for celexa 10 qd

## 2020-01-03 NOTE — Assessment & Plan Note (Signed)
stable overall by history and exam, recent data reviewed with pt, and pt to continue medical treatment as before,  to f/u any worsening symptoms or concerns  

## 2020-01-03 NOTE — Assessment & Plan Note (Addendum)
stable overall by history and exam, recent data reviewed with pt, and pt to continue medical treatment as before,  to f/u any worsening symptoms or concerns  I spent 31 minutes in preparing to see the patient by review of recent labs, imaging and procedures, obtaining and reviewing separately obtained history, communicating with the patient and family or caregiver, ordering medications, tests or procedures, and documenting clinical information in the EHR including the differential Dx, treatment, and any further evaluation and other management of orthostatic BP, anxiety, gerd, osetoporosis

## 2020-01-04 ENCOUNTER — Telehealth: Payer: Self-pay

## 2020-01-04 NOTE — Telephone Encounter (Signed)
-----   Message from Biagio Borg, MD sent at 01/04/2020  1:16 PM EDT ----- Regarding: RE: prolia start - plot pt I believe pt agreed to start ----- Message ----- From: Aviva Signs, Al[place]: 01/04/2020   9:21 AM EDT To: Biagio Borg, MD, Cassandria Anger, MD Subject: FW: prolia start - plot pt                     Prolia cost pt $0  Do either of you know if she has decided to start Prolia?  ----- Message ----- From: Karie Soda Sent: 12/31/2019   2:04 PM EDT To: Aviva Signs, CMA Subject: RE: prolia start - plot pt                     I have run benefits for Prolia. $0 copay, okay to get any time. It has been discussed a few times with her. Looks like she has been undecided on if she wants to get it or not. ----- Message ----- From: Johney Frame, Adelina Mings, CMA Sent: 12/31/2019   1:28 PM EDT To: Isidoro Donning Morphies Subject: FW: prolia start - plot pt                     Dr. Jenny Reichmann sent me this patient.   Can you see if she is already on Prolia and if not, she should be started. Let me know if she is not on it already - I will call to discuss.  ----- Message ----- From: Biagio Borg, MD Sent: 12/31/2019  11:04 AM EDT To: Aviva Signs, CMA Subject: prolia start - plot pt                         Please to see about start prolia

## 2020-01-06 NOTE — Telephone Encounter (Signed)
Pt contacted and patient stated that she wanted to speak with Dr. Alain Marion regarding Prolia.   Pt has been scheduled.

## 2020-01-15 ENCOUNTER — Ambulatory Visit: Payer: Medicare Other | Admitting: Internal Medicine

## 2020-01-21 ENCOUNTER — Other Ambulatory Visit: Payer: Self-pay

## 2020-01-21 ENCOUNTER — Ambulatory Visit (INDEPENDENT_AMBULATORY_CARE_PROVIDER_SITE_OTHER): Payer: Medicare Other | Admitting: Internal Medicine

## 2020-01-21 ENCOUNTER — Encounter: Payer: Self-pay | Admitting: Internal Medicine

## 2020-01-21 DIAGNOSIS — M810 Age-related osteoporosis without current pathological fracture: Secondary | ICD-10-CM | POA: Diagnosis not present

## 2020-01-21 DIAGNOSIS — E559 Vitamin D deficiency, unspecified: Secondary | ICD-10-CM

## 2020-01-21 DIAGNOSIS — R42 Dizziness and giddiness: Secondary | ICD-10-CM

## 2020-01-21 DIAGNOSIS — I2510 Atherosclerotic heart disease of native coronary artery without angina pectoris: Secondary | ICD-10-CM | POA: Diagnosis not present

## 2020-01-21 DIAGNOSIS — F419 Anxiety disorder, unspecified: Secondary | ICD-10-CM

## 2020-01-21 NOTE — Assessment & Plan Note (Signed)
Recurrent On Rx No syncope or near-syncope

## 2020-01-21 NOTE — Assessment & Plan Note (Signed)
Prolia is approved. Pt has doubts about the med, worried about Prolia side effects ie "low BP" risk... Endo ref

## 2020-01-21 NOTE — Assessment & Plan Note (Signed)
Dr Harrington Challenger - Crestor

## 2020-01-21 NOTE — Assessment & Plan Note (Signed)
Not taking Celexa yet

## 2020-01-21 NOTE — Assessment & Plan Note (Signed)
Vit D 

## 2020-01-21 NOTE — Progress Notes (Signed)
Subjective:  Patient ID: Cindy Meza, female    DOB: [birth year]  Age: 84 y.o. MRN: [id number]  CC: No chief complaint on file.   HPI Cindy Meza presents for severe osteoporosis - doubts about meds, worried about Prolia side effects ie "low BP" risk...  F/u lightheadedness   Outpatient Medications Prior to Visit  Medication Sig Dispense Refill  . ADVAIR HFA 115-21 MCG/ACT inhaler INHALE 2 PUFFS INTO THE LUNGS TWICE DAILY 12 g 1  . aspirin 81 MG tablet Take 81 mg by mouth daily.    . Calcium-Magnesium-Vitamin D (CALCIUM 500 PO) Take 1 each by mouth daily.    . Cholecalciferol 25 MCG (1000 UT) tablet Take 2 tablets (2,000 Units total) by mouth daily. 100 tablet 5  . citalopram (CELEXA) 10 MG tablet Take 1 tablet (10 mg total) by mouth daily. 90 tablet 3  . CVS OMEGA-3 KRILL OIL 500 MG CAPS Take 500 capsules by mouth daily.    . Cyanocobalamin (VITAMIN B-12) 1000 MCG SUBL Place 1 tablet (1,000 mcg total) under the tongue daily. 100 tablet 3  . famotidine (PEPCID) 40 MG tablet     . fludrocortisone (FLORINEF) 0.1 MG tablet Take 0.5 tablets (0.05 mg total) by mouth daily. 45 tablet 3  . fluticasone (FLONASE) 50 MCG/ACT nasal spray Place 2 sprays into both nostrils daily. 16 g 6  . loratadine (CLARITIN) 10 MG tablet Take 10 mg by mouth daily.     . rosuvastatin (CRESTOR) 5 MG tablet Take by mouth.    . triamcinolone cream (KENALOG) 0.1 % Apply 1 application topically 2 (two) times daily. 80 g 1  . Wheat Dextrin (BENEFIBER) TABS 1 tab qd 42 tablet 11  . RABEprazole (ACIPHEX) 20 MG tablet Take 1 tablet (20 mg total) by mouth in the morning. (Patient not taking: Reported on 01/21/2020) 90 tablet 3   No facility-administered medications prior to visit.    ROS: Review of Systems  Constitutional: Negative for activity change, appetite change, chills, fatigue and unexpected weight change.  HENT: Negative for congestion, mouth sores and sinus pressure.   Eyes: Negative for visual  disturbance.  Respiratory: Negative for cough and chest tightness.   Gastrointestinal: Negative for abdominal pain and nausea.  Genitourinary: Negative for difficulty urinating, frequency and vaginal pain.  Musculoskeletal: Positive for arthralgias. Negative for back pain and gait problem.  Skin: Negative for pallor and rash.  Neurological: Negative for dizziness, tremors, weakness, numbness and headaches.  Psychiatric/Behavioral: Negative for confusion, sleep disturbance and suicidal ideas.    Objective:  BP 140/82 (BP Location: Right Arm, Patient Position: Sitting, Cuff Size: Small)   Pulse 84   Temp (!) 97.5 F (36.4 C) (Oral)   Ht 5\' 2"  (1.575 m)   Wt 106 lb (48.1 kg)   SpO2 98%   BMI 19.39 kg/m   BP Readings from Last 3 Encounters:  01/21/20 140/82  12/31/19 92/68  12/04/19 122/68    Wt Readings from Last 3 Encounters:  01/21/20 106 lb (48.1 kg)  12/31/19 106 lb (48.1 kg)  12/04/19 106 lb 12.8 oz (48.4 kg)    Physical Exam Constitutional:      General: She is not in acute distress.    Appearance: She is well-developed.  HENT:     Head: Normocephalic.     Right Ear: External ear normal.     Left Ear: External ear normal.     Nose: Nose normal.  Eyes:     General:  Right eye: No discharge.        Left eye: No discharge.     Conjunctiva/sclera: Conjunctivae normal.     Pupils: Pupils are equal, round, and reactive to light.  Neck:     Thyroid: No thyromegaly.     Vascular: No JVD.     Trachea: No tracheal deviation.  Cardiovascular:     Rate and Rhythm: Normal rate and regular rhythm.     Heart sounds: Normal heart sounds.  Pulmonary:     Effort: No respiratory distress.     Breath sounds: No stridor. No wheezing.  Abdominal:     General: Bowel sounds are normal. There is no distension.     Palpations: Abdomen is soft. There is no mass.     Tenderness: There is no abdominal tenderness. There is no guarding or rebound.  Musculoskeletal:         General: No tenderness.     Cervical back: Normal range of motion and neck supple.  Lymphadenopathy:     Cervical: No cervical adenopathy.  Skin:    Findings: No erythema or rash.  Neurological:     Cranial Nerves: No cranial nerve deficit.     Motor: No abnormal muscle tone.     Coordination: Coordination normal.     Deep Tendon Reflexes: Reflexes normal.  Psychiatric:        Behavior: Behavior normal.        Thought Content: Thought content normal.        Judgment: Judgment normal.    FTF>25 min   Lab Results  Component Value Date   WBC 5.9 09/16/2019   HGB 11.1 (L) 09/16/2019   HCT 33.2 (L) 09/16/2019   PLT 266.0 09/16/2019   GLUCOSE 135 (H) 10/26/2019   CHOL 258 (H) 04/10/2018   TRIG 68.0 04/10/2018   HDL 119.60 04/10/2018   LDLDIRECT 104.0 03/17/2013   LDLCALC 124 (H) 04/10/2018   ALT 13 10/26/2019   AST 22 10/26/2019   NA 135 10/26/2019   K 3.6 10/26/2019   CL 100 10/26/2019   CREATININE 0.85 10/26/2019   BUN 14 10/26/2019   CO2 28 10/26/2019   TSH 1.52 01/19/2019   INR 1.04 06/12/2016   HGBA1C 4.9 04/09/2017    CT Abdomen Pelvis W Contrast  Result Date: 09/28/2019 CLINICAL DATA:  Periumbilical pain weight loss.  Constipation. EXAM: CT ABDOMEN AND PELVIS WITH CONTRAST TECHNIQUE: Multidetector CT imaging of the abdomen and pelvis was performed using the standard protocol following bolus administration of intravenous contrast. CONTRAST:  161mL ISOVUE-300 IOPAMIDOL (ISOVUE-300) INJECTION 61% COMPARISON:  04/28/2015 FINDINGS: Lower chest: No acute abnormality. Hepatobiliary: No focal liver abnormality is seen. No gallstones, gallbladder wall thickening, or biliary dilatation. Pancreas: Unremarkable. No pancreatic ductal dilatation or surrounding inflammatory changes. Spleen: Normal in size without focal abnormality. Adrenals/Urinary Tract: Normal appearance of the adrenal glands. No kidney mass or hydronephrosis identified bilaterally urinary bladder unremarkable.  Stomach/Bowel: Stomach is within normal limits. The appendix is not confidently identified. No evidence of bowel wall thickening, distention, or inflammatory changes. Vascular/Lymphatic: Aortic atherosclerosis. No aneurysm. No abdominopelvic adenopathy identified. Reproductive: Status post hysterectomy. No adnexal masses. Other: No abdominal wall hernia or abnormality. No abdominopelvic ascites. Musculoskeletal: Previous ORIF of right femur. Sclerotic lesion identified within the left sacral wing, unchanged. Favor benign bone island. IMPRESSION: 1. No acute findings within the abdomen or pelvis. No mass or adenopathy noted. 2.  Aortic Atherosclerosis (ICD10-I70.0). Electronically Signed   By: Queen Slough.D.  On: 09/28/2019 12:52    Assessment & Plan:    [name], MD

## 2020-02-26 ENCOUNTER — Other Ambulatory Visit: Payer: Medicare Other | Admitting: *Deleted

## 2020-02-26 ENCOUNTER — Other Ambulatory Visit: Payer: Self-pay

## 2020-02-26 DIAGNOSIS — E785 Hyperlipidemia, unspecified: Secondary | ICD-10-CM

## 2020-02-26 DIAGNOSIS — Z79899 Other long term (current) drug therapy: Secondary | ICD-10-CM

## 2020-02-26 LAB — LIPID PANEL
Chol/HDL Ratio: 1.9 ratio (ref 0.0–4.4)
Cholesterol, Total: 208 mg/dL — ABNORMAL HIGH (ref 100–199)
HDL: 110 mg/dL (ref >39)
LDL Chol Calc (NIH): 89 mg/dL (ref 0–99)
Triglycerides: 50 mg/dL (ref 0–149)
VLDL Cholesterol Cal: 9 mg/dL (ref 5–40)

## 2020-02-26 LAB — AST: AST: 16 IU/L (ref 0–40)

## 2020-03-01 ENCOUNTER — Ambulatory Visit: Payer: Medicare Other | Admitting: Internal Medicine

## 2020-03-03 ENCOUNTER — Telehealth: Payer: Self-pay | Admitting: *Deleted

## 2020-03-03 MED ORDER — ROSUVASTATIN CALCIUM 5 MG PO TABS
2.5000 mg | ORAL_TABLET | Freq: Every day | ORAL | 3 refills | Status: DC
Start: 2020-03-03 — End: 2020-06-10

## 2020-03-03 NOTE — Telephone Encounter (Signed)
-----   Message from Dorris Carnes V, MD sent at 02/28/2020  9:00 PM EDT ----- LIpids:  LDL 89  Goal is less than 70    Her liver function is OK  If toleraating statin every other day I would recomm increasing to daily   F?U  lipids in 8 wks

## 2020-03-03 NOTE — Telephone Encounter (Signed)
Pt will increase rosuvastatin to 2.5 mg every day.  She has been made aware of potential s/e muslce or joint pains and will call if notices these.  Has appointment with Dr. Alain Marion on 10/22.  If lipids not checked at that visit, I will contact her to schedule lab appointment for recheck of lipids.

## 2020-03-09 NOTE — Progress Notes (Signed)
Name: Cindy Meza  MRN/ DOB: [id number], 19-Apr-1936    Age/ Sex: 84 y.o., female    PCP: Cassandria Anger, MD   Reason for Endocrinology Evaluation: Osteoporosis      Date of Initial Endocrinology Evaluation: 03/10/2020     HPI: [name] is a 84 y.o. female with a past medical history of Osteoporosis, Asthma and CAD. The patient presented for initial endocrinology clinic visit on 03/10/2020 for consultative assistance with her Osteoporosis    Pt was diagnosed with osteoporosis: 2018  Menarche at age : 66 Menopausal at age : in her 89's  Fracture Hx: Right hip fracture  Hx of HRT: yes  FH of osteoporosis or hip fracture: no Prior Hx of anti-estrogenic therapy : no  Prior Hx of anti-resorptive therapy : Intolerant to Fosamax - heel pain  Many years ago. Restarted ~ 2020 but stopped in 2021 due to abdominal pain.   No prior exposure to radiation  Calcium 1 tab twice daily   Vitamin D3 2000 iu daily   HISTORY:  Past Medical History:  Past Medical History:  Diagnosis Date   Allergy    Anemia    Asthma    Dr. Melvyn Novas   Blood transfusion without reported diagnosis    Breast cyst    Left   Cataract    Dr. Gershon Crane   Complication of anesthesia    ' I HAD HALLUCINATIONS WHEN THEY DID MY HIP "   GERD (gastroesophageal reflux disease)    Heart murmur    Hemorrhoids    History of colonic polyps Cindy Meza   hx of adenomas (small) 1998   OA (osteoarthritis)    Orthostatic hypotension    Osteopenia    PAC (premature atrial contraction)    Vitamin D deficiency    Past Surgical History:  Past Surgical History:  Procedure Laterality Date   ABDOMINAL HYSTERECTOMY     complete   APPENDECTOMY     BREAST CYST EXCISION     CATARACT EXTRACTION, BILATERAL     04/23/18 left eye, 04/02/18 right eye   COLONOSCOPY  Multiple   Adenomatous colon polyps   ESOPHAGOGASTRODUODENOSCOPY  2010   INTRAMEDULLARY (IM) NAIL INTERTROCHANTERIC Right  06/13/2016   Procedure: INTRAMEDULLARY (IM) NAIL INTERTROCHANTRIC;  Surgeon: Tania Ade, MD;  Location: [facility];  Service: Orthopedics;  Laterality: Right;   TONSILLECTOMY        Social History:  reports that she has never smoked. She has never used smokeless tobacco. She reports current alcohol use. She reports that she does not use drugs.  Family History: family history includes Diabetes (age of onset: 67) in her mother; Hypertension in an other family member.   HOME MEDICATIONS: Allergies as of 03/10/2020      Reactions   Fosamax [alendronate Sodium]    Paresthesias, abd pain   Gabapentin    dizziness   Lidocaine Swelling   Of face and lips.  Novocaine does same thing.   Midodrine    Nausea   Risedronate Sodium    Knee pain   Shellfish Allergy Hives   Statins    REACTION: pains      Medication List       Accurate as of March 10, 2020 10:50 AM. If you have any questions, ask your nurse or doctor.        STOP taking these medications   Benefiber Tabs Stopped by: Dorita Sciara, MD   citalopram 10 MG tablet Commonly known as:  CeleXA Stopped by: Dorita Sciara, MD   RABEprazole 20 MG tablet Commonly known as: Aciphex Stopped by: Dorita Sciara, MD   triamcinolone cream 0.1 % Commonly known as: KENALOG Stopped by: Dorita Sciara, MD     TAKE these medications   Advair HFA 115-21 MCG/ACT inhaler Generic drug: fluticasone-salmeterol INHALE 2 PUFFS INTO THE LUNGS TWICE DAILY   aspirin 81 MG tablet Take 81 mg by mouth daily.   CALCIUM 500 PO Take 1 each by mouth daily.   Cholecalciferol 25 MCG (1000 UT) tablet Take 2 tablets (2,000 Units total) by mouth daily.   CVS Omega-3 Krill Oil 500 MG Caps Take 500 capsules by mouth daily.   famotidine 40 MG tablet Commonly known as: PEPCID   fludrocortisone 0.1 MG tablet Commonly known as: FLORINEF Take 0.5 tablets (0.05 mg total) by mouth daily.   fluticasone 50 MCG/ACT nasal  spray Commonly known as: FLONASE Place 2 sprays into both nostrils daily.   loratadine 10 MG tablet Commonly known as: CLARITIN Take 10 mg by mouth daily.   rosuvastatin 5 MG tablet Commonly known as: CRESTOR Take 0.5 tablets (2.5 mg total) by mouth daily.   Vitamin B-12 1000 MCG Subl Place 1 tablet (1,000 mcg total) under the tongue daily.         REVIEW OF SYSTEMS: A comprehensive ROS was conducted with the patient and is negative except as per HPI    OBJECTIVE:  VS: BP (!) 150/80 (BP Location: Left Arm, Patient Position: Sitting, Cuff Size: Normal)    Pulse 78    Ht 5' 1.5" (1.562 m)    Wt 105 lb (47.6 kg)    SpO2 98%    BMI 19.52 kg/m    Wt Readings from Last 3 Encounters:  03/10/20 105 lb (47.6 kg)  01/21/20 106 lb (48.1 kg)  12/31/19 106 lb (48.1 kg)     EXAM: General: Pt appears well and is in NAD  Neck: General: Supple without adenopathy. Thyroid: Thyroid size normal.  No goiter or nodules appreciated. No thyroid bruit.  Lungs: Clear with good BS bilat with no rales, rhonchi, or wheezes  Heart: Auscultation: RRR.  Abdomen: Normoactive bowel sounds, soft, nontender, without masses or organomegaly palpable  Extremities:  BL LE: No pretibial edema normal ROM and strength.  Skin: Hair: Texture and amount normal with gender appropriate distribution Skin Inspection: No rashes Skin Palpation: Skin temperature, texture, and thickness normal to palpation  Neuro: Cranial nerves: II - XII grossly intact  Motor: Normal strength throughout DTRs: 2+ and symmetric in UE without delay in relaxation phase  Mental Status: Judgment, insight: Intact Orientation: Oriented to time, place, and person Mood and affect: No depression, anxiety, or agitation     DATA REVIEWED:   Results for NATALEIGH, GRIFFIN (MRN [id number]) as of 03/10/2020 08:15  Ref. Range 10/26/2019 15:26  VITD Latest Ref Range: 30.00 - 100.00 ng/mL 70.53  Vitamin B12 Latest Ref Range: 211 - 911 pg/mL 286    Results for ESSA, WENK (MRN [id number]) as of 03/10/2020 08:15  Ref. Range 10/26/2019 57:32  BASIC METABOLIC PANEL Unknown Rpt (A)  Sodium Latest Ref Range: 135 - 145 mEq/L 135  Potassium Latest Ref Range: 3.5 - 5.1 mEq/L 3.6  Chloride Latest Ref Range: 96 - 112 mEq/L 100  CO2 Latest Ref Range: 19 - 32 mEq/L 28  Glucose Latest Ref Range: 70 - 99 mg/dL 135 (H)  BUN Latest Ref Range: 6 - 23 mg/dL 14  Creatinine Latest Ref Range: 0.40 - 1.20 mg/dL 0.85  Calcium Latest Ref Range: 8.4 - 10.5 mg/dL 9.5  Alkaline Phosphatase Latest Ref Range: 39 - 117 U/L 51  Albumin Latest Ref Range: 3.5 - 5.2 g/dL 4.1  AST Latest Ref Range: 0 - 37 U/L 22  ALT Latest Ref Range: 0 - 35 U/L 13  Total Protein Latest Ref Range: 6.0 - 8.3 g/dL 7.3  Bilirubin, Direct Latest Ref Range: 0.0 - 0.3 mg/dL 0.0  Total Bilirubin Latest Ref Range: 0.2 - 1.2 mg/dL 0.3  GFR Latest Ref Range: >60.00 mL/min 77.52     01/30/2013 02/13/2017 04/2019  L1-L4  -2.2  -3.2  -3.4  LFN  -1.3  -2.3  -2.5    ASSESSMENT/PLAN/RECOMMENDATIONS:   1. Osteoporosis :   - Pt has hx of Alendronate intolernace in the past , she has been on it many years ago and again in 2020. She recently stopped  It due to GI issues in 2021. - I have recommended  Teriparatide or abaloparatide for two years but the pt declined due to the requirement of daily injections. - Pt agreed to proceed with prolia for the next 3 yrs  - We discussed rare side effect of atypical fractures and osteonecrosis, flu-like symptoms  - I am going to ask her to reduce vitamin D dose from 2000 iu to 1000 iu due to Vitamin D level of 70.53 ng/mL      Medications : Calcium 1 tabs BID Vitamin D3 1000 iu daily  Prolia 60 mg [place] Q 6 months    F/u in 6 months  Signed electronically by: Mack Guise, MD  [facility] Endocrinology  [facility] [address] [place], [place] [address] Phone: [phone] FAX:  [phone]   CC: Cassandria Anger, MD [place] [address] Phone: [phone] Fax: [phone]   Return to Endocrinology clinic as below: Future Appointments  Date Time Provider [place]  04/29/2020  3:00 PM [name], Evie Lacks, MD [facility] None

## 2020-03-10 ENCOUNTER — Ambulatory Visit (INDEPENDENT_AMBULATORY_CARE_PROVIDER_SITE_OTHER): Payer: Medicare Other | Admitting: Internal Medicine

## 2020-03-10 ENCOUNTER — Other Ambulatory Visit: Payer: Self-pay

## 2020-03-10 ENCOUNTER — Telehealth: Payer: Self-pay | Admitting: Internal Medicine

## 2020-03-10 ENCOUNTER — Encounter: Payer: Self-pay | Admitting: Internal Medicine

## 2020-03-10 VITALS — BP 150/80 | HR 78 | Ht 61.5 in | Wt 105.0 lb

## 2020-03-10 DIAGNOSIS — M810 Age-related osteoporosis without current pathological fracture: Secondary | ICD-10-CM

## 2020-03-10 NOTE — Telephone Encounter (Signed)
Cindy Meza,   Can you please set this patient up for Prolia ?   Thanks    [name] Jordan, MD  [facility] Endocrinology  [facility] [address] [place], [place] [address] Phone: [phone] FAX: [phone]

## 2020-03-10 NOTE — Patient Instructions (Addendum)
-   Please decrease Vitamin D3 to 1000 iu daily  - Continue Calcium tablets twice daily

## 2020-03-11 NOTE — Telephone Encounter (Signed)
Patient has been submitted to [employer] to verify coverage and benefits for Prolia.

## 2020-04-07 ENCOUNTER — Telehealth: Payer: Self-pay

## 2020-04-07 NOTE — Telephone Encounter (Signed)
New message    Seen on 9.2.21   The patient was told by the MD would be calling her back on medication recommendation  / [facility] approval.

## 2020-04-08 NOTE — Telephone Encounter (Signed)
Looking at her chart it looks like this is for Prolia.

## 2020-04-12 NOTE — Telephone Encounter (Signed)
Patient called regarding doctors recommendation for not getting bone density and mammogram at this time.  Her call back number is [phone].

## 2020-04-13 ENCOUNTER — Telehealth: Payer: Self-pay

## 2020-04-13 NOTE — Telephone Encounter (Signed)
Patient has contacted office regarding her Prolia injection. I was advised to send patient information to you for it to be taken care of.

## 2020-04-13 NOTE — Telephone Encounter (Addendum)
Patient would like to know if she needs to get a mammogram at this time and if so she would like to have it order. Patient would also like to know if you recommend a flu shot at this time. I will send a message to Linus Galas to contact patient regarding Prolia.

## 2020-04-14 NOTE — Telephone Encounter (Addendum)
I have already contacted this patient scheduled her for nurse visit on 05/05/20 to get Prolia and advised her to contact her PCP about mammogram-this has been resolved

## 2020-04-25 ENCOUNTER — Ambulatory Visit: Payer: Medicare Other | Admitting: Internal Medicine

## 2020-04-26 ENCOUNTER — Ambulatory Visit: Payer: Medicare Other | Admitting: Internal Medicine

## 2020-04-29 ENCOUNTER — Ambulatory Visit (INDEPENDENT_AMBULATORY_CARE_PROVIDER_SITE_OTHER): Payer: Medicare Other | Admitting: Internal Medicine

## 2020-04-29 ENCOUNTER — Other Ambulatory Visit: Payer: Self-pay

## 2020-04-29 ENCOUNTER — Encounter: Payer: Self-pay | Admitting: Internal Medicine

## 2020-04-29 VITALS — BP 132/70 | HR 79 | Temp 98.6°F | Ht 61.5 in | Wt 105.5 lb

## 2020-04-29 DIAGNOSIS — Z0000 Encounter for general adult medical examination without abnormal findings: Secondary | ICD-10-CM

## 2020-04-29 DIAGNOSIS — K5900 Constipation, unspecified: Secondary | ICD-10-CM | POA: Insufficient documentation

## 2020-04-29 DIAGNOSIS — K5903 Drug induced constipation: Secondary | ICD-10-CM

## 2020-04-29 DIAGNOSIS — M810 Age-related osteoporosis without current pathological fracture: Secondary | ICD-10-CM

## 2020-04-29 DIAGNOSIS — I2510 Atherosclerotic heart disease of native coronary artery without angina pectoris: Secondary | ICD-10-CM

## 2020-04-29 NOTE — Patient Instructions (Signed)
Try Rosuvastatin 1/2 tablet every other day or 2-3 times a week

## 2020-04-29 NOTE — Progress Notes (Signed)
Subjective:  Patient ID: Cindy Meza, female    DOB: April 18, 1936  Age: 84 y.o. MRN: [id number]  CC: Follow-up   HPI Aven Cegielski presents for a well exam C/o new constipation - stools q 3 d w/LOC - Bisacodyl F/u on osteoporosis  Outpatient Medications Prior to Visit  Medication Sig Dispense Refill  . ADVAIR HFA 115-21 MCG/ACT inhaler INHALE 2 PUFFS INTO THE LUNGS TWICE DAILY 12 g 1  . aspirin 81 MG tablet Take 81 mg by mouth daily.    . Calcium-Magnesium-Vitamin D (CALCIUM 500 PO) Take 1 each by mouth daily.    . Cholecalciferol 25 MCG (1000 UT) tablet Take 2 tablets (2,000 Units total) by mouth daily. 100 tablet 5  . [facility]S OMEGA-3 KRILL OIL 500 MG CAPS Take 500 capsules by mouth daily.    . Cyanocobalamin (VITAMIN B-12) 1000 MCG SUBL Place 1 tablet (1,000 mcg total) under the tongue daily. 100 tablet 3  . famotidine (PEPCID) 40 MG tablet     . fludrocortisone (FLORINEF) 0.1 MG tablet Take 0.5 tablets (0.05 mg total) by mouth daily. 45 tablet 3  . fluticasone (FLONASE) 50 MCG/ACT nasal spray Place 2 sprays into both nostrils daily. 16 g 6  . loratadine (CLARITIN) 10 MG tablet Take 10 mg by mouth daily.     . rosuvastatin (CRESTOR) 5 MG tablet Take 0.5 tablets (2.5 mg total) by mouth daily. 45 tablet 3   No facility-administered medications prior to visit.    ROS: Review of Systems  Constitutional: Positive for fatigue. Negative for activity change, appetite change, chills and unexpected weight change.  HENT: Negative for congestion, mouth sores and sinus pressure.   Eyes: Negative for visual disturbance.  Respiratory: Negative for cough and chest tightness.   Gastrointestinal: Positive for constipation. Negative for abdominal pain and nausea.  Genitourinary: Negative for difficulty urinating, frequency and vaginal pain.  Musculoskeletal: Negative for back pain and gait problem.  Skin: Negative for pallor and rash.  Neurological: Negative for dizziness, tremors,  weakness, numbness and headaches.  Psychiatric/Behavioral: Negative for confusion and sleep disturbance. The patient is nervous/anxious.     Objective:  BP 132/70 (BP Location: Left Arm, Patient Position: Sitting, Cuff Size: Small)   Pulse 79   Temp 98.6 F (37 C) (Oral)   Ht 5' 1.5" (1.562 m)   Wt 105 lb 8 oz (47.9 kg)   SpO2 99%   BMI 19.61 kg/m   BP Readings from Last 3 Encounters:  04/29/20 132/70  03/10/20 (!) 150/80  01/21/20 140/82    Wt Readings from Last 3 Encounters:  04/29/20 105 lb 8 oz (47.9 kg)  03/10/20 105 lb (47.6 kg)  01/21/20 106 lb (48.1 kg)    Physical Exam Constitutional:      General: She is not in acute distress.    Appearance: She is well-developed.  HENT:     Head: Normocephalic.     Right Ear: External ear normal.     Left Ear: External ear normal.     Nose: Nose normal.  Eyes:     General:        Right eye: No discharge.        Left eye: No discharge.     Conjunctiva/sclera: Conjunctivae normal.     Pupils: Pupils are equal, round, and reactive to light.  Neck:     Thyroid: No thyromegaly.     Vascular: No JVD.     Trachea: No tracheal deviation.  Cardiovascular:  Rate and Rhythm: Normal rate and regular rhythm.     Heart sounds: Normal heart sounds.  Pulmonary:     Effort: No respiratory distress.     Breath sounds: No stridor. No wheezing.  Abdominal:     General: Bowel sounds are normal. There is no distension.     Palpations: Abdomen is soft. There is no mass.     Tenderness: There is no abdominal tenderness. There is no guarding or rebound.  Musculoskeletal:        General: No tenderness.     Cervical back: Normal range of motion and neck supple.  Lymphadenopathy:     Cervical: No cervical adenopathy.  Skin:    Findings: No erythema or rash.  Neurological:     Mental Status: She is oriented to person, place, and time.     Cranial Nerves: No cranial nerve deficit.     Motor: No abnormal muscle tone.      Coordination: Coordination normal.     Deep Tendon Reflexes: Reflexes normal.  Psychiatric:        Behavior: Behavior normal.        Thought Content: Thought content normal.        Judgment: Judgment normal.    I spent 22 minutes in addition to time for CPX wellness examination in preparing to see the patient by review of recent labs, imaging and procedures, obtaining and reviewing separately obtained history, communicating with the patient, ordering medications, tests or procedures, and documenting clinical information in the EHR including the differential diagnosis, treatment, and any further evaluation and other management of  osteoporosis, constipation.     Assessment & Plan Note by     Lab Results  Component Value Date   WBC 5.9 09/16/2019   HGB 11.1 (L) 09/16/2019   HCT 33.2 (L) 09/16/2019   PLT 266.0 09/16/2019   GLUCOSE 135 (H) 10/26/2019   CHOL 208 (H) 02/26/2020   TRIG 50 02/26/2020   HDL 110 02/26/2020   LDLDIRECT 104.0 03/17/2013   LDLCALC 89 02/26/2020   ALT 13 10/26/2019   AST 16 02/26/2020   NA 135 10/26/2019   K 3.6 10/26/2019   CL 100 10/26/2019   CREATININE 0.85 10/26/2019   BUN 14 10/26/2019   CO2 28 10/26/2019   TSH 1.52 01/19/2019   INR 1.04 06/12/2016   HGBA1C 4.9 04/09/2017    CT Abdomen Pelvis W Contrast  Result Date: 09/28/2019 CLINICAL DATA:  Periumbilical pain weight loss.  Constipation. EXAM: CT ABDOMEN AND PELVIS WITH CONTRAST TECHNIQUE: Multidetector CT imaging of the abdomen and pelvis was performed using the standard protocol following bolus administration of intravenous contrast. CONTRAST:  192mL ISOVUE-300 IOPAMIDOL (ISOVUE-300) INJECTION 61% COMPARISON:  04/28/2015 FINDINGS: Lower chest: No acute abnormality. Hepatobiliary: No focal liver abnormality is seen. No gallstones, gallbladder wall thickening, or biliary dilatation. Pancreas: Unremarkable. No pancreatic ductal dilatation or surrounding inflammatory changes. Spleen: Normal in size  without focal abnormality. Adrenals/Urinary Tract: Normal appearance of the adrenal glands. No kidney mass or hydronephrosis identified bilaterally urinary bladder unremarkable. Stomach/Bowel: Stomach is within normal limits. The appendix is not confidently identified. No evidence of bowel wall thickening, distention, or inflammatory changes. Vascular/Lymphatic: Aortic atherosclerosis. No aneurysm. No abdominopelvic adenopathy identified. Reproductive: Status post hysterectomy. No adnexal masses. Other: No abdominal wall hernia or abnormality. No abdominopelvic ascites. Musculoskeletal: Previous ORIF of right femur. Sclerotic lesion identified within the left sacral wing, unchanged. Favor benign bone island. IMPRESSION: 1. No acute findings within the abdomen or pelvis.  No mass or adenopathy noted. 2.  Aortic Atherosclerosis (ICD10-I70.0). Electronically Signed   By: Kerby Moors M.D.   On: 09/28/2019 12:52    Assessment & Plan:    [name], MD

## 2020-04-29 NOTE — Assessment & Plan Note (Signed)
  Try Rosuvastatin 1/2 tablet every other day or 2-3 times a week due to aches, constipation Consider Repatha if issues

## 2020-04-29 NOTE — Assessment & Plan Note (Signed)
Try Rosuvastatin 1/2 tablet every other day or 2-3 times a week LOC

## 2020-04-29 NOTE — Assessment & Plan Note (Signed)
  We discussed age appropriate health related issues, including available/recomended screening tests and vaccinations. Labs were ordered to be later reviewed . All questions were answered. We discussed one or more of the following - seat belt use, use of sunscreen/sun exposure exercise, safe sex, fall risk reduction, second hand smoke exposure, firearm use and storage, seat belt use, a need for adhering to healthy diet and exercise. Labs were ordered.  All questions were answered.   

## 2020-04-29 NOTE — Assessment & Plan Note (Signed)
Prolia - pt is getting her first inj soon

## 2020-05-05 ENCOUNTER — Ambulatory Visit (INDEPENDENT_AMBULATORY_CARE_PROVIDER_SITE_OTHER): Payer: Medicare Other | Admitting: *Deleted

## 2020-05-05 ENCOUNTER — Other Ambulatory Visit: Payer: Self-pay

## 2020-05-05 DIAGNOSIS — M810 Age-related osteoporosis without current pathological fracture: Secondary | ICD-10-CM

## 2020-05-05 MED ORDER — DENOSUMAB 60 MG/ML ~~LOC~~ SOSY
60.0000 mg | PREFILLED_SYRINGE | Freq: Once | SUBCUTANEOUS | Status: AC
  Administered 2020-05-05: 60 mg via SUBCUTANEOUS

## 2020-05-11 NOTE — Progress Notes (Signed)
Agree with assessment and plan     [name] Jordan, MD  [facility] Endocrinology  [facility] [address] [place], [place] [address] Phone: [phone] FAX: [phone]

## 2020-06-10 ENCOUNTER — Other Ambulatory Visit: Payer: Self-pay

## 2020-06-10 ENCOUNTER — Encounter: Payer: Self-pay | Admitting: Internal Medicine

## 2020-06-10 ENCOUNTER — Ambulatory Visit (INDEPENDENT_AMBULATORY_CARE_PROVIDER_SITE_OTHER): Payer: Medicare Other | Admitting: Internal Medicine

## 2020-06-10 VITALS — BP 122/80 | HR 78 | Ht 61.5 in | Wt 103.8 lb

## 2020-06-10 DIAGNOSIS — E559 Vitamin D deficiency, unspecified: Secondary | ICD-10-CM | POA: Diagnosis not present

## 2020-06-10 DIAGNOSIS — I951 Orthostatic hypotension: Secondary | ICD-10-CM | POA: Diagnosis not present

## 2020-06-10 DIAGNOSIS — I2510 Atherosclerotic heart disease of native coronary artery without angina pectoris: Secondary | ICD-10-CM

## 2020-06-10 MED ORDER — ROSUVASTATIN CALCIUM 5 MG PO TABS: 2.5000 mg | ORAL_TABLET | ORAL | 3 refills | Status: DC

## 2020-06-10 NOTE — Progress Notes (Signed)
Cardiology Office Note   Date:  06/10/2020   ID:  Cindy Meza, DOB [birth year], MRN [id number]  PCP:  Cassandria Anger, MD  Cardiologist:   Dorris Carnes, MD   Pt presents for f/u of orthostatic hypotension    History of Present Illness: Cindy Meza is a 84 y.o. female with a history  fatigue and orthostatic intolerance.  ALso a hx of mitral regurgitation, asthma, GERD   Myovue in 2017 and 2019 were normal   Echo with LVEF 50 to 55% with mild diastolic dysfunction and mod MR    I last saw the pt in clinic in May 2021  She says she has felt pretty good   Not dizzy   BP list from home, systolic 89 to 889V/     SHe has been taking Crestor 1x per week   Cut back due to consitpation  Trying to gain weight.  Outpatient Medications Prior to Visit  Medication Sig Dispense Refill  . ADVAIR HFA 115-21 MCG/ACT inhaler INHALE 2 PUFFS INTO THE LUNGS TWICE DAILY 12 g 1  . aspirin 81 MG tablet Take 81 mg by mouth daily.    . Calcium-Magnesium-Vitamin D (CALCIUM 500 PO) Take 1 each by mouth daily.    . Cholecalciferol 25 MCG (1000 UT) tablet Take 2 tablets (2,000 Units total) by mouth daily. 100 tablet 5  . CVS OMEGA-3 KRILL OIL 500 MG CAPS Take 500 capsules by mouth daily.    . Cyanocobalamin (VITAMIN B-12) 1000 MCG SUBL Place 1 tablet (1,000 mcg total) under the tongue daily. 100 tablet 3  . famotidine (PEPCID) 40 MG tablet     . fludrocortisone (FLORINEF) 0.1 MG tablet Take 0.5 tablets (0.05 mg total) by mouth daily. 45 tablet 3  . fluticasone (FLONASE) 50 MCG/ACT nasal spray Place 2 sprays into both nostrils daily. 16 g 6  . loratadine (CLARITIN) 10 MG tablet Take 10 mg by mouth daily.     . rosuvastatin (CRESTOR) 5 MG tablet Take 0.5 tablets (2.5 mg total) by mouth daily. 45 tablet 3   No facility-administered medications prior to visit.     Allergies:   Fosamax [alendronate sodium], Gabapentin, Lidocaine, Midodrine, Risedronate sodium, Shellfish allergy, and Statins    Past Medical History:  Diagnosis Date  . Allergy   . Anemia   . Asthma    Dr. Melvyn Novas  . Blood transfusion without reported diagnosis   . Breast cyst    Left  . Cataract    Dr. Gershon Crane  . Complication of anesthesia    ' I HAD HALLUCINATIONS WHEN THEY DID MY HIP "  . GERD (gastroesophageal reflux disease)   . Heart murmur   . Hemorrhoids   . History of colonic polyps [name]   hx of adenomas (small) 1998  . OA (osteoarthritis)   . Orthostatic hypotension   . Osteopenia   . PAC (premature atrial contraction)   . Vitamin D deficiency     Past Surgical History:  Procedure Laterality Date  . ABDOMINAL HYSTERECTOMY     complete  . APPENDECTOMY    . BREAST CYST EXCISION    . CATARACT EXTRACTION, BILATERAL     04/23/18 left eye, 04/02/18 right eye  . COLONOSCOPY  Multiple   Adenomatous colon polyps  . ESOPHAGOGASTRODUODENOSCOPY  2010  . INTRAMEDULLARY (IM) NAIL INTERTROCHANTERIC Right 06/13/2016   Procedure: INTRAMEDULLARY (IM) NAIL INTERTROCHANTRIC;  Surgeon: Tania Ade, MD;  Location: [facility];  Service: Orthopedics;  Laterality: Right;  .  TONSILLECTOMY       Social History:  The patient  reports that she has never smoked. She has never used smokeless tobacco. She reports current alcohol use. She reports that she does not use drugs.   Family History:  The patient's family history includes Diabetes (age of onset: 30) in her mother; Hypertension in an other family member.  No history of CAD known    Mother had PPM    ROS:  Please see the history of present illness. All other systems are reviewed and  Negative to the above problem except as noted.    PHYSICAL EXAM: VS:  BP 122/80   Pulse 78   Ht 5' 1.5" (1.562 m)   Wt 103 lb 12.8 oz (47.1 kg)   SpO2 99%   BMI 19.30 kg/m     GEN: Thin 84 year old in no acute distress  HEENT: normal  Neck: JVP is normal  No carotid bruits Cardiac: RRR; no murmurs.  No LE  edema  Respiratory:  clear to auscultation bilaterally,   GI: soft, nontender, nondistended, + BS  No hepatomegaly  MS: no deformity Moving all extremities   Skin: warm and dry, no rash Neuro:  Strength and sensation are intact Psych: euthymic mood, full affect   EKG:  EKG is not ordered.  Lipid Panel    Component Value Date/Time   CHOL 208 (H) 02/26/2020 0904   TRIG 50 02/26/2020 0904   HDL 110 02/26/2020 0904   CHOLHDL 1.9 02/26/2020 0904   CHOLHDL 2 04/10/2018 1207   VLDL 13.6 04/10/2018 1207   LDLCALC 89 02/26/2020 0904   LDLDIRECT 104.0 03/17/2013 1026      Wt Readings from Last 3 Encounters:  06/10/20 103 lb 12.8 oz (47.1 kg)  04/29/20 105 lb 8 oz (47.9 kg)  03/10/20 105 lb (47.6 kg)      ASSESSMENT AND PLAN:  1  Orthostatic hypotension  The pt says has not been bothered by any significant dizziness Trying to stay hydrated   Will check BMET   Keep on Florinef qd   Check BMET    2  Osteoporosis.  ON Prolia now   WIll check Vit D   Set up with sports med for possible exercise regimen  Pt with back pain   Wants to avoid fall    3  CAD Plaquing of aorta and coronary arteries  Normal myovue in Dec 2019  No symptoms of angina   Continue to follow   4 Hyperlipidemia\Has been on statin in past Achy   I would encourage her to take 2x per wk and if tolerates qod      F/U in April 2022  Check BMET and Vit D today   Current medicines are reviewed at length with the patient today.  The patient does not have concerns regarding medicines.   Signed, Dorris Carnes, MD  06/10/2020 11:33 AM    [facility] [address], [place], [place]  [address] Phone: [phone]; Fax: [phone]

## 2020-06-10 NOTE — Patient Instructions (Signed)
Medication Instructions:  Change Crestor (rosuvastatin) to 1/2 tablet twice weekly, working up to every other day  *If you need a refill on your cardiac medications before your next appointment, please call your pharmacy*   Lab Work: Bmet, vit d If you have labs (blood work) drawn today and your tests are completely normal, you will receive your results only by: Marland Kitchen MyChart Message (if you have MyChart) OR . A paper copy in the mail If you have any lab test that is abnormal or we need to change your treatment, we will call you to review the results.   Testing/Procedures: none   Follow-Up: At [facility], you and your health needs are our priority.  As part of our continuing mission to provide you with exceptional heart care, we have created designated Provider Care Teams.  These Care Teams include your primary Cardiologist (physician) and Advanced Practice Providers (APPs -  Physician Assistants and Nurse Practitioners) who all work together to provide you with the care you need, when you need it.  We recommend signing up for the patient portal called "MyChart".  Sign up information is provided on this After Visit Summary.  MyChart is used to connect with patients for Virtual Visits (Telemedicine).  Patients are able to view lab/test results, encounter notes, upcoming appointments, etc.  Non-urgent messages can be sent to your provider as well.   To learn more about what you can do with MyChart, go to [email or web address].    Your next appointment:   4 month(s)  The format for your next appointment:   In Person  Provider:   You may see Dorris Carnes, MD or one of the following Advanced Practice Providers on your designated Care Team:    Richardson Dopp, PA-C  Robbie Lis, [place]    Other Instructions You have been referred to Sports Medicine - Dr. Charlann Boxer.

## 2020-06-11 LAB — BASIC METABOLIC PANEL
BUN/Creatinine Ratio: 13 (ref 12–28)
BUN: 13 mg/dL (ref 8–27)
CO2: 24 mmol/L (ref 20–29)
Calcium: 9.3 mg/dL (ref 8.7–10.3)
Chloride: 103 mmol/L (ref 96–106)
Creatinine, Ser: 1 mg/dL (ref 0.57–1.00)
GFR calc Af Amer: 61 mL/min/{1.73_m2} (ref >59)
GFR calc non Af Amer: 53 mL/min/{1.73_m2} — ABNORMAL LOW (ref >59)
Glucose: 79 mg/dL (ref 65–99)
Potassium: 4 mmol/L (ref 3.5–5.2)
Sodium: 140 mmol/L (ref 134–144)

## 2020-06-11 LAB — VITAMIN D 25 HYDROXY (VIT D DEFICIENCY, FRACTURES): Vit D, 25-Hydroxy: 38.5 ng/mL (ref 30.0–100.0)

## 2020-06-15 ENCOUNTER — Other Ambulatory Visit: Payer: Self-pay | Admitting: *Deleted

## 2020-06-15 MED ORDER — CHOLECALCIFEROL 100 MCG (4000 UT) PO TABS: 4000.0000 [IU] | ORAL_TABLET | Freq: Every day | ORAL | Status: DC

## 2020-06-29 ENCOUNTER — Telehealth: Payer: Self-pay | Admitting: Internal Medicine

## 2020-06-29 DIAGNOSIS — I951 Orthostatic hypotension: Secondary | ICD-10-CM

## 2020-06-29 DIAGNOSIS — Z79899 Other long term (current) drug therapy: Secondary | ICD-10-CM

## 2020-06-29 NOTE — Telephone Encounter (Signed)
Pt c/o medication issue:  1. Name of Medication: fludrocortisone (FLORINEF) 0.1 MG tablet  2. How are you currently taking this medication (dosage and times per day)? Half a tablet daily  3. Are you having a reaction (difficulty breathing--STAT)? no  4. What is your medication issue? Patient states she has been feeling nauseas and lightheaded. She states she has not fainted and does not feel like she will faint. She states she is not sure if the medication needs to be adjusted.

## 2020-06-29 NOTE — Telephone Encounter (Signed)
Shaky, lightheaded and unsteady when walks.  This has been over the last 4-5 days.  Deltana when still.  Feeling off balance.  Asked about hydration.  The least amount of water she drinks per day is 40 ounces.  BP 104/71, HR 94 this am.  Now: 116/68, 78.  No chest tightness.  Asked her about salt.  She has started adding salt as Dr. Harrington Challenger recommended so she thinks she should be getting enough.  I asked her to eat salty foods, snacks and continue to add salt to foods otherwise.  She is going to start wearing her support stockings.  She asks about any medicine changes that should be made.  I adv I would forward to Dr. Harrington Challenger to let her know what we talked about and if there are other recommendations we would call her back.  Adv to call back in few days if not feeling better.

## 2020-06-30 MED ORDER — FLUDROCORTISONE ACETATE 0.1 MG PO TABS: ORAL_TABLET | ORAL | 3 refills | Status: DC

## 2020-06-30 NOTE — Telephone Encounter (Signed)
Reviewed note from [place] with some dizziness   She was not bothered by it when I saw her last Can increase florinef to am and 2 PM Agree with increased salt/ fluid   If goes up on florinef then check BMET in 2 wks

## 2020-06-30 NOTE — Telephone Encounter (Signed)
I spoke with patient and gave her instructions from Dr Harrington Challenger.  She will come in for BMP on 07/14/20.  Prescription sent to [facility] on [address]

## 2020-07-14 ENCOUNTER — Other Ambulatory Visit: Payer: Self-pay

## 2020-07-14 ENCOUNTER — Other Ambulatory Visit: Payer: Medicare Other

## 2020-07-14 DIAGNOSIS — Z79899 Other long term (current) drug therapy: Secondary | ICD-10-CM

## 2020-07-14 DIAGNOSIS — I951 Orthostatic hypotension: Secondary | ICD-10-CM

## 2020-07-14 LAB — BASIC METABOLIC PANEL WITH GFR
BUN/Creatinine Ratio: 16 (ref 12–28)
BUN: 14 mg/dL (ref 8–27)
CO2: 22 mmol/L (ref 20–29)
Calcium: 8.8 mg/dL (ref 8.7–10.3)
Chloride: 105 mmol/L (ref 96–106)
Creatinine, Ser: 0.87 mg/dL (ref 0.57–1.00)
GFR calc Af Amer: 72 mL/min/{1.73_m2}
GFR calc non Af Amer: 62 mL/min/{1.73_m2}
Glucose: 97 mg/dL (ref 65–99)
Potassium: 3.7 mmol/L (ref 3.5–5.2)
Sodium: 141 mmol/L (ref 134–144)

## 2020-07-19 NOTE — Progress Notes (Unsigned)
Cindy Meza Phone: [phone] Subjective:   Cindy Meza, am serving as a scribe for Dr. Hulan Saas. This visit occurred during the SARS-CoV-2 public health emergency.  Safety protocols were in place, including screening questions prior to the visit, additional usage of staff PPE, and extensive cleaning of exam room while observing appropriate contact time as indicated for disinfecting solutions.   I'm seeing this patient by the request  of:  [name], Evie Lacks, MD  CC: Back pain  [id number]  Cindy Meza is a 85 y.o. female coming in with complaint of back pain. Patient states that she has had pain for a while. Pain increases with weight bearing and lumbar flexion. Denies any radiating symptoms. Patient will push through pain or sit down on a stool to do chores. Does not use any medications for pain.  Patient is looking for guidance.  Wants to remain more active.  Does not want to lose the strength that she has at this time    Patient is already receiving Prolia from endocrinology last injection was May 05, 2020  Patient's last bone density was 2020 and found to have osteoporosis with a T score of -2.5  Vitamin D level of 38.5  Past Medical History:  Diagnosis Date  . Allergy   . Anemia   . Asthma    Dr. Melvyn Novas  . Blood transfusion without reported diagnosis   . Breast cyst    Left  . Cataract    Dr. Gershon Crane  . Complication of anesthesia    ' I HAD HALLUCINATIONS WHEN THEY DID MY HIP "  . GERD (gastroesophageal reflux disease)   . Heart murmur   . Hemorrhoids   . History of colonic polyps [name]   hx of adenomas (small) 1998  . OA (osteoarthritis)   . Orthostatic hypotension   . Osteopenia   . PAC (premature atrial contraction)   . Vitamin D deficiency    Past Surgical History:  Procedure Laterality Date  . ABDOMINAL HYSTERECTOMY     complete  . APPENDECTOMY    . BREAST  CYST EXCISION    . CATARACT EXTRACTION, BILATERAL     04/23/18 left eye, 04/02/18 right eye  . COLONOSCOPY  Multiple   Adenomatous colon polyps  . ESOPHAGOGASTRODUODENOSCOPY  2010  . INTRAMEDULLARY (IM) NAIL INTERTROCHANTERIC Right 06/13/2016   Procedure: INTRAMEDULLARY (IM) NAIL INTERTROCHANTRIC;  Surgeon: Tania Ade, MD;  Location: [facility];  Service: Orthopedics;  Laterality: Right;  . TONSILLECTOMY     Social History   Socioeconomic History  . Marital status: Widowed    Spouse name: Not on file  . Number of children: Not on file  . Years of education: Not on file  . Highest education level: Not on file  Occupational History  . Occupation: retired  Tobacco Use  . Smoking status: Never Smoker  . Smokeless tobacco: Never Used  Vaping Use  . Vaping Use: Never used  Substance and Sexual Activity  . Alcohol use: Yes    Alcohol/week: 0.0 standard drinks    Comment: 3-4 times per week - vodka 1 drink  . Drug use: Meza  . Sexual activity: Not Currently  Other Topics Concern  . Not on file  Social History Narrative   She lives alone in a [place] home.  Meza children.   Retired Education officer, museum.   Highest level of education:  Masters degree   Social Determinants of Health  Financial Resource Strain: Not on file  Food Insecurity: Not on file  Transportation Needs: Not on file  Physical Activity: Not on file  Stress: Not on file  Social Connections: Not on file   Allergies  Allergen Reactions  . Fosamax [Alendronate Sodium]     Paresthesias, abd pain  . Gabapentin     dizziness  . Lidocaine Swelling    Of face and lips.  Novocaine does same thing.  . Midodrine     Nausea   . Risedronate Sodium     Knee pain  . Shellfish Allergy Hives  . Statins     REACTION: pains   Family History  Problem Relation Age of Onset  . Diabetes Mother 51       Deceased  . Hypertension Other   . Colon cancer Neg Hx   . Pancreatic cancer Neg Hx   . Stomach cancer Neg Hx   .  Esophageal cancer Neg Hx   . Rectal cancer Neg Hx     Current Outpatient Medications (Endocrine & Metabolic):  .  fludrocortisone (FLORINEF) 0.1 MG tablet, Take half tablet every AM and at 2 PM daily  Current Outpatient Medications (Cardiovascular):  .  rosuvastatin (CRESTOR) 5 MG tablet, Take 0.5 tablets (2.5 mg total) by mouth every other day.  Current Outpatient Medications (Respiratory):  Marland Kitchen  ADVAIR HFA 115-21 MCG/ACT inhaler, INHALE 2 PUFFS INTO THE LUNGS TWICE DAILY .  fluticasone (FLONASE) 50 MCG/ACT nasal spray, Place 2 sprays into both nostrils daily. Marland Kitchen  loratadine (CLARITIN) 10 MG tablet, Take 10 mg by mouth daily.  Current Outpatient Medications (Analgesics):  .  aspirin 81 MG tablet, Take 81 mg by mouth daily.  Current Outpatient Medications (Hematological):  Marland Kitchen  Cyanocobalamin (VITAMIN B-12) 1000 MCG SUBL, Place 1 tablet (1,000 mcg total) under the tongue daily.  Current Outpatient Medications (Other):  Marland Kitchen  Calcium-Magnesium-Vitamin D (CALCIUM 500 PO), Take 1 each by mouth daily. .  Cholecalciferol 100 MCG (4000 UT) TABS, Take 4,000 Units by mouth daily. .  [employer] OMEGA-3 KRILL OIL 500 MG CAPS, Take 500 capsules by mouth daily. .  famotidine (PEPCID) 40 MG tablet,    Reviewed prior external information including notes and imaging from  primary care provider As well as notes that were available from care everywhere and other healthcare systems.  Past medical history, social, surgical and family history all reviewed in electronic medical record.  Meza pertanent information unless stated regarding to the chief complaint.   Review of Systems:  Meza headache, visual changes, nausea, vomiting, diarrhea, constipation, dizziness, abdominal pain, skin rash, fevers, chills, night sweats, weight loss, swollen lymph nodes, joint swelling, chest pain, shortness of breath, mood changes. POSITIVE muscle aches, body aches  Objective  Blood pressure 112/82, pulse 92, height 5' 1.5" (1.562 m),  weight 104 lb (47.2 kg), SpO2 99 %.   General: Meza apparent distress alert and oriented x3 mood and affect normal, dressed appropriately.  Patient does appear to be underweight HEENT: Pupils equal, extraocular movements intact  Respiratory: Patient's speak in full sentences and does not appear short of breath  Cardiovascular: Meza lower extremity edema, non tender, Meza erythema  Gait slow but seems to do relatively well. Patient neurovascularly intact distally of the feet.  Patient does not have a significant amount of muscle mass.  Mild potential atrophy of the thighs bilaterally.  Neurovascularly intact.  4 out of 5 strength of lower extremities but symmetric.  Mild pain over the sacroiliac joints bilaterally  possibly left greater than right.  Patient has significant decrease in range of motion of the right hip but does have a history of surgery on it.  Low back exam does have loss of lordosis with some mild degenerative scoliosis.   Impression and Recommendations:     The above documentation has been reviewed and is accurate and complete Judi Saa, DO

## 2020-07-20 ENCOUNTER — Ambulatory Visit (INDEPENDENT_AMBULATORY_CARE_PROVIDER_SITE_OTHER): Payer: Medicare Other | Admitting: Family Medicine

## 2020-07-20 ENCOUNTER — Encounter: Payer: Self-pay | Admitting: Family Medicine

## 2020-07-20 ENCOUNTER — Other Ambulatory Visit: Payer: Self-pay

## 2020-07-20 ENCOUNTER — Ambulatory Visit (INDEPENDENT_AMBULATORY_CARE_PROVIDER_SITE_OTHER): Payer: Medicare Other

## 2020-07-20 VITALS — BP 112/82 | HR 92 | Ht 61.5 in | Wt 104.0 lb

## 2020-07-20 DIAGNOSIS — E559 Vitamin D deficiency, unspecified: Secondary | ICD-10-CM | POA: Diagnosis not present

## 2020-07-20 DIAGNOSIS — G8929 Other chronic pain: Secondary | ICD-10-CM

## 2020-07-20 DIAGNOSIS — M810 Age-related osteoporosis without current pathological fracture: Secondary | ICD-10-CM

## 2020-07-20 DIAGNOSIS — M545 Low back pain, unspecified: Secondary | ICD-10-CM

## 2020-07-20 NOTE — Assessment & Plan Note (Signed)
Patient is having low back pain, vomiting does seem to be more consistent with potential spinal stenosis with patient fatiguing more than truly having any weakness.  Patient on exam does not have any true radicular symptoms but does have mild atrophy of the lower extremities.  I do believe that there is some deconditioning occurring.  I do feel that formal physical therapy will be beneficial.  Patient has had allergy to certain medications including gabapentin and continues to have some difficulty with her blood pressure so we will hold on any type of prescription medications at the moment.  Increase activity slowly.  Follow-up again 6 to 8 weeks.

## 2020-07-20 NOTE — Assessment & Plan Note (Signed)
Continue vitamin D and core strengthening.  Sent to physical therapy

## 2020-07-20 NOTE — Patient Instructions (Signed)
Xray today [facility] can be helpful  Continue Vit D See me in 7 weeks

## 2020-07-20 NOTE — Assessment & Plan Note (Signed)
Patient is being treated properly by endocrinology and doing very well.  Continue vitamin D.

## 2020-07-26 ENCOUNTER — Telehealth: Payer: Self-pay | Admitting: Internal Medicine

## 2020-07-26 NOTE — Telephone Encounter (Signed)
LVM for pt to rtn my call to schedule AWV with [facility]. Please schedule AWV with pt calls the office .  

## 2020-08-04 ENCOUNTER — Other Ambulatory Visit: Payer: Self-pay

## 2020-08-04 ENCOUNTER — Ambulatory Visit (INDEPENDENT_AMBULATORY_CARE_PROVIDER_SITE_OTHER): Payer: Medicare Other | Admitting: Internal Medicine

## 2020-08-04 ENCOUNTER — Encounter: Payer: Self-pay | Admitting: Internal Medicine

## 2020-08-04 DIAGNOSIS — M810 Age-related osteoporosis without current pathological fracture: Secondary | ICD-10-CM

## 2020-08-04 DIAGNOSIS — I951 Orthostatic hypotension: Secondary | ICD-10-CM | POA: Diagnosis not present

## 2020-08-04 DIAGNOSIS — E559 Vitamin D deficiency, unspecified: Secondary | ICD-10-CM

## 2020-08-04 DIAGNOSIS — K219 Gastro-esophageal reflux disease without esophagitis: Secondary | ICD-10-CM | POA: Diagnosis not present

## 2020-08-04 DIAGNOSIS — J4520 Mild intermittent asthma, uncomplicated: Secondary | ICD-10-CM

## 2020-08-04 NOTE — Patient Instructions (Addendum)
Ensure Plus twice a day [place] yogurt 10% fat Whole milk

## 2020-08-04 NOTE — Progress Notes (Signed)
Subjective:  Patient ID: Cindy Meza, female    DOB: 1935/08/12  Age: 85 y.o. MRN: [id number]  CC: Follow-up (3 month f/u)   HPI Cindy Meza presents for wt loss, orthostatic hypotension, asthma, osteoporosis  Outpatient Medications Prior to Visit  Medication Sig Dispense Refill  . ADVAIR HFA 115-21 MCG/ACT inhaler INHALE 2 PUFFS INTO THE LUNGS TWICE DAILY 12 g 1  . aspirin 81 MG tablet Take 81 mg by mouth daily.    . Calcium-Magnesium-Vitamin D (CALCIUM 500 PO) Take 1 each by mouth daily.    . Cholecalciferol 100 MCG (4000 UT) TABS Take 4,000 Units by mouth daily. 30 tablet   . [employer] OMEGA-3 KRILL OIL 500 MG CAPS Take 500 capsules by mouth daily.    . Cyanocobalamin (VITAMIN B-12) 1000 MCG SUBL Place 1 tablet (1,000 mcg total) under the tongue daily. 100 tablet 3  . famotidine (PEPCID) 40 MG tablet     . fludrocortisone (FLORINEF) 0.1 MG tablet Take half tablet every AM and at 2 PM daily 90 tablet 3  . fluticasone (FLONASE) 50 MCG/ACT nasal spray Place 2 sprays into both nostrils daily. 16 g 6  . loratadine (CLARITIN) 10 MG tablet Take 10 mg by mouth daily.    . rosuvastatin (CRESTOR) 5 MG tablet Take 0.5 tablets (2.5 mg total) by mouth every other day. 45 tablet 3   No facility-administered medications prior to visit.    ROS: Review of Systems  Constitutional: Negative for activity change, appetite change, chills, fatigue and unexpected weight change.  HENT: Negative for congestion, mouth sores and sinus pressure.   Eyes: Negative for visual disturbance.  Respiratory: Negative for cough and chest tightness.   Gastrointestinal: Negative for abdominal pain and nausea.  Genitourinary: Negative for difficulty urinating, frequency and vaginal pain.  Musculoskeletal: Negative for back pain and gait problem.  Skin: Negative for pallor and rash.  Neurological: Negative for dizziness, tremors, weakness, numbness and headaches.  Psychiatric/Behavioral: Negative for  confusion and sleep disturbance.    Objective:  BP 128/64 (BP Location: Left Arm)   Pulse 88   Temp 97.7 F (36.5 C) (Oral)   Wt 105 lb (47.6 kg)   SpO2 96%   BMI 19.52 kg/m   BP Readings from Last 3 Encounters:  08/04/20 128/64  07/20/20 112/82  06/10/20 122/80    Wt Readings from Last 3 Encounters:  08/04/20 105 lb (47.6 kg)  07/20/20 104 lb (47.2 kg)  06/10/20 103 lb 12.8 oz (47.1 kg)    Physical Exam Constitutional:      General: She is not in acute distress.    Appearance: She is well-developed.  HENT:     Head: Normocephalic.     Right Ear: External ear normal.     Left Ear: External ear normal.     Nose: Nose normal.     Mouth/Throat:     Mouth: Oropharynx is clear and moist.  Eyes:     General:        Right eye: No discharge.        Left eye: No discharge.     Conjunctiva/sclera: Conjunctivae normal.     Pupils: Pupils are equal, round, and reactive to light.  Neck:     Thyroid: No thyromegaly.     Vascular: No JVD.     Trachea: No tracheal deviation.  Cardiovascular:     Rate and Rhythm: Normal rate and regular rhythm.     Heart sounds: Normal heart sounds.  Pulmonary:     Effort: No respiratory distress.     Breath sounds: No stridor. No wheezing.  Abdominal:     General: Bowel sounds are normal. There is no distension.     Palpations: Abdomen is soft. There is no mass.     Tenderness: There is no abdominal tenderness. There is no guarding or rebound.  Musculoskeletal:        General: No tenderness or edema.     Cervical back: Normal range of motion and neck supple.  Lymphadenopathy:     Cervical: No cervical adenopathy.  Skin:    Findings: No erythema or rash.  Neurological:     Cranial Nerves: No cranial nerve deficit.     Motor: No abnormal muscle tone.     Coordination: Coordination normal.     Deep Tendon Reflexes: Reflexes normal.  Psychiatric:        Mood and Affect: Mood and affect normal.        Behavior: Behavior normal.         Thought Content: Thought content normal.        Judgment: Judgment normal.   Thin  Lab Results  Component Value Date   WBC 5.9 09/16/2019   HGB 11.1 (L) 09/16/2019   HCT 33.2 (L) 09/16/2019   PLT 266.0 09/16/2019   GLUCOSE 97 07/14/2020   CHOL 208 (H) 02/26/2020   TRIG 50 02/26/2020   HDL 110 02/26/2020   LDLDIRECT 104.0 03/17/2013   LDLCALC 89 02/26/2020   ALT 13 10/26/2019   AST 16 02/26/2020   NA 141 07/14/2020   K 3.7 07/14/2020   CL 105 07/14/2020   CREATININE 0.87 07/14/2020   BUN 14 07/14/2020   CO2 22 07/14/2020   TSH 1.52 01/19/2019   INR 1.04 06/12/2016   HGBA1C 4.9 04/09/2017    CT Abdomen Pelvis W Contrast  Result Date: 09/28/2019 CLINICAL DATA:  Periumbilical pain weight loss.  Constipation. EXAM: CT ABDOMEN AND PELVIS WITH CONTRAST TECHNIQUE: Multidetector CT imaging of the abdomen and pelvis was performed using the standard protocol following bolus administration of intravenous contrast. CONTRAST:  149mL ISOVUE-300 IOPAMIDOL (ISOVUE-300) INJECTION 61% COMPARISON:  04/28/2015 FINDINGS: Lower chest: No acute abnormality. Hepatobiliary: No focal liver abnormality is seen. No gallstones, gallbladder wall thickening, or biliary dilatation. Pancreas: Unremarkable. No pancreatic ductal dilatation or surrounding inflammatory changes. Spleen: Normal in size without focal abnormality. Adrenals/Urinary Tract: Normal appearance of the adrenal glands. No kidney mass or hydronephrosis identified bilaterally urinary bladder unremarkable. Stomach/Bowel: Stomach is within normal limits. The appendix is not confidently identified. No evidence of bowel wall thickening, distention, or inflammatory changes. Vascular/Lymphatic: Aortic atherosclerosis. No aneurysm. No abdominopelvic adenopathy identified. Reproductive: Status post hysterectomy. No adnexal masses. Other: No abdominal wall hernia or abnormality. No abdominopelvic ascites. Musculoskeletal: Previous ORIF of right femur.  Sclerotic lesion identified within the left sacral wing, unchanged. Favor benign bone island. IMPRESSION: 1. No acute findings within the abdomen or pelvis. No mass or adenopathy noted. 2.  Aortic Atherosclerosis (ICD10-I70.0). Electronically Signed   By: Kerby Moors M.D.   On: 09/28/2019 12:52    Assessment & Plan:     Follow-up: No follow-ups on file.  [name], MD

## 2020-08-08 ENCOUNTER — Ambulatory Visit (INDEPENDENT_AMBULATORY_CARE_PROVIDER_SITE_OTHER): Payer: Medicare Other

## 2020-08-08 DIAGNOSIS — Z0000 Encounter for general adult medical examination without abnormal findings: Secondary | ICD-10-CM | POA: Diagnosis not present

## 2020-08-08 NOTE — Progress Notes (Addendum)
I connected with Cindy Meza today by telephone and verified that I am speaking with the correct person using two identifiers. Location patient: home Location provider: work Persons participating in the virtual visit: Cindy Meza and Lisette Abu, LPN.   I discussed the limitations, risks, security and privacy concerns of performing an evaluation and management service by telephone and the availability of in person appointments. I also discussed with the patient that there may be a patient responsible charge related to this service. The patient expressed understanding and verbally consented to this telephonic visit.    Interactive audio and video telecommunications were attempted between this provider and patient, however failed, due to patient having technical difficulties OR patient did not have access to video capability.  We continued and completed visit with audio only.  Some vital signs may be absent or patient reported.   Time Spent with patient on telephone encounter: 60 minutes  Subjective:   Cindy Meza is a 85 y.o. female who presents for Medicare Annual (Subsequent) preventive examination.  Review of Systems    No ROS. Medicare Wellness Visit. Additional risk factors are reflected in social history. Cardiac Risk Factors include: advanced age (>18men, >55 women);dyslipidemia     Objective:    There were no vitals filed for this visit. There is no height or weight on file to calculate BMI.  Advanced Directives 08/08/2020 04/24/2018 06/14/2016  Does Patient Have a Medical Advance Directive? Yes Yes Yes  Type of Paramedic of [place];Living will [place];Living will [place]  Does patient want to make changes to medical advance directive? No - Patient declined - No - Patient declined  Copy of [place] in Chart? No - copy requested - No - copy requested    Current  Medications (verified) Outpatient Encounter Medications as of 08/08/2020  Medication Sig   ADVAIR HFA 115-21 MCG/ACT inhaler INHALE 2 PUFFS INTO THE LUNGS TWICE DAILY   aspirin 81 MG tablet Take 81 mg by mouth daily.   Calcium-Magnesium-Vitamin D (CALCIUM 500 PO) Take 1 each by mouth daily. Take 1 capsule with breakfast   Cholecalciferol 100 MCG (4000 UT) TABS Take 4,000 Units by mouth daily.   [employer] OMEGA-3 KRILL OIL 500 MG CAPS Take 500 capsules by mouth daily.   Cyanocobalamin (VITAMIN B-12) 1000 MCG SUBL Place 1 tablet (1,000 mcg total) under the tongue daily.   famotidine (PEPCID) 40 MG tablet Take 40 mg by mouth at bedtime.   fludrocortisone (FLORINEF) 0.1 MG tablet Take half tablet every AM and at 2 PM daily   fluticasone (FLONASE) 50 MCG/ACT nasal spray Place 2 sprays into both nostrils daily.   loratadine (CLARITIN) 10 MG tablet Take 10 mg by mouth daily.   polyethylene glycol (MIRALAX / GLYCOLAX) 17 g packet Take 17 g by mouth daily.   rosuvastatin (CRESTOR) 5 MG tablet Take 0.5 tablets (2.5 mg total) by mouth every other day.   No facility-administered encounter medications on file as of 08/08/2020.    Allergies (verified) Fosamax [alendronate sodium], Gabapentin, Lidocaine, Midodrine, Risedronate sodium, Shellfish allergy, and Statins   History: Past Medical History:  Diagnosis Date   Allergy    Anemia    Asthma    Dr. Melvyn Novas   Blood transfusion without reported diagnosis    Breast cyst    Left   Cataract    Dr. Gershon Crane   Complication of anesthesia    ' I HAD [place]  DID MY HIP "   GERD (gastroesophageal reflux disease)    Heart murmur    Hemorrhoids    History of colonic polyps [place]   hx of adenomas (small) 1998   OA (osteoarthritis)    Orthostatic hypotension    Osteopenia    PAC (premature atrial contraction)    Vitamin D deficiency    Past Surgical History:  Procedure Laterality Date   ABDOMINAL HYSTERECTOMY     complete   APPENDECTOMY      BREAST CYST EXCISION     CATARACT EXTRACTION, BILATERAL     04/23/18 left eye, 04/02/18 right eye   COLONOSCOPY  Multiple   Adenomatous colon polyps   ESOPHAGOGASTRODUODENOSCOPY  2010   INTRAMEDULLARY (IM) NAIL INTERTROCHANTERIC Right 06/13/2016   Procedure: INTRAMEDULLARY (IM) NAIL INTERTROCHANTRIC;  Surgeon: Tania Ade, MD;  Location: [facility];  Service: Orthopedics;  Laterality: Right;   TONSILLECTOMY     Family History  Problem Relation Age of Onset   Diabetes Mother 20       Deceased   Hypertension Other    Colon cancer Neg Hx    Pancreatic cancer Neg Hx    Stomach cancer Neg Hx    Esophageal cancer Neg Hx    Rectal cancer Neg Hx    Social History   Socioeconomic History   Marital status: Widowed    Spouse name: Not on file   Number of children: Not on file   Years of education: Not on file   Highest education level: Not on file  Occupational History   Occupation: retired  Tobacco Use   Smoking status: Never Smoker   Smokeless tobacco: Never Used  Scientific laboratory technician Use: Never used  Substance and Sexual Activity   Alcohol use: Yes    Alcohol/week: 0.0 standard drinks    Comment: 3-4 times per week - vodka 1 drink   Drug use: No   Sexual activity: Not Currently  Other Topics Concern   Not on file  Social History Narrative   She lives alone in a one-story home.  No children.   Retired Education officer, museum.   Highest level of education:  Masters degree   Social Determinants of Health   Financial Resource Strain: Low Risk    Difficulty of Paying Living Expenses: Not hard at all  Food Insecurity: No Food Insecurity   Worried About Charity fundraiser in the Last Year: Never true   Arboriculturist in the Last Year: Never true  Transportation Needs: No Transportation Needs   Lack of Transportation (Medical): No   Lack of Transportation (Non-Medical): No  Physical Activity: Inactive   Days of Exercise per Week: 0 days   Minutes of Exercise per Session: 0 min   Stress: No Stress Concern Present   Feeling of Stress : Not at all  Social Connections: Moderately Integrated   Frequency of Communication with Friends and Family: More than three times a week   Frequency of Social Gatherings with Friends and Family: More than three times a week   Attends Religious Services: More than 4 times per year   Active Member of [employer] or Organizations: Yes   Attends Archivist Meetings: More than 4 times per year   Marital Status: Widowed    Tobacco Counseling Counseling given: Not Answered   Clinical Intake:  Pre-visit preparation completed: Yes  Pain : No/denies pain     Nutritional Risks: Unintentional weight loss Diabetes: No  How often  do you need to have someone help you when you read instructions, pamphlets, or other written materials from your doctor or pharmacy?: 1 - Never What is the last grade level you completed in school?: Master's Degree  Diabetic? no  Interpreter Needed?: No  Information entered by :: Lisette Abu, LPN   Activities of Daily Living In your present state of health, do you have any difficulty performing the following activities: 08/08/2020  Hearing? N  Vision? N  Difficulty concentrating or making decisions? N  Walking or climbing stairs? N  Dressing or bathing? N  Doing errands, shopping? N  Preparing Food and eating ? N  Using the Toilet? N  In the past six months, have you accidently leaked urine? N  Do you have problems with loss of bowel control? N  Managing your Medications? N  Managing your Finances? N  Housekeeping or managing your Housekeeping? N  Some recent data might be hidden    Patient Care Team: [name], Evie Lacks, MD as PCP - General [name], MD as PCP - Cardiology (Cardiology) Aloha Gell, MD (Obstetrics and Gynecology) Gatha Mayer, MD as Consulting Physician (Gastroenterology) [facility], Melanie Crazier, MD as Consulting Physician  (Endocrinology)  Indicate any recent Medical Services you may have received from other than [facility] providers in the past year (date may be approximate).     Assessment:   This is a routine wellness examination for [place].  Hearing/Vision screen No exam data present  Dietary issues and exercise activities discussed: Current Exercise Habits: The patient does not participate in regular exercise at present, Exercise limited by: respiratory conditions(s);orthopedic condition(s)  Goals      Patient Stated     I would like to get off of some of these medications.       Depression Screen PHQ 2/9 Scores 08/08/2020 10/26/2019 07/03/2018 10/24/2016 09/27/2015 08/13/2014  PHQ - 2 Score 0 0 0 0 0 0    Fall Risk Fall Risk  08/08/2020 10/26/2019 04/10/2018 10/24/2016 09/27/2015  Falls in the past year? 0 0 No Yes No  Number falls in past yr: 0 - - 1 -  Injury with Fall? 0 - - Yes -  Risk for fall due to : No Fall Risks - - - -  Follow up - Falls evaluation completed - - -    FALL RISK PREVENTION PERTAINING TO THE HOME:  Any stairs in or around the home? No  If so, are there any without handrails? No  Home free of loose throw rugs in walkways, pet beds, electrical cords, etc? Yes  Adequate lighting in your home to reduce risk of falls? Yes   ASSISTIVE DEVICES UTILIZED TO PREVENT FALLS:  Life alert? No  Use of a cane, walker or w/c? No  Grab bars in the bathroom? No  Shower chair or bench in shower? Yes  Elevated toilet seat or a handicapped toilet? Yes   TIMED UP AND GO:  Was the test performed? No .  Length of time to ambulate 10 feet: 0 sec.   Gait steady and fast without use of assistive device  Cognitive Function: No flowsheet data found.         Immunizations Immunization History  Administered Date(s) Administered   Fluad Quad(high Dose 65+) 04/14/2019, 04/28/2020   Influenza Split 05/16/2011, 05/07/2012   Influenza Whole 04/29/2008, 04/27/2010   Influenza, High Dose  Seasonal PF 04/04/2016, 04/09/2017, 04/10/2018   Influenza,inj,Quad PF,6+ Mos 03/17/2013, 03/23/2014, 03/30/2015   Moderna SARS-COV2 Booster Vaccination  05/12/2020   Moderna Sars-Covid-2 Vaccination 08/02/2019, 08/30/2019   PPD Test 06/15/2016   Pneumococcal Conjugate-13 09/17/2013   Pneumococcal Polysaccharide-23 02/02/2010   Td 06/10/2007   Zoster 10/28/2007   Zoster Recombinat (Shingrix) 08/11/2017, 03/17/2018    TDAP status: Due, Education has been provided regarding the importance of this vaccine. Advised may receive this vaccine at local pharmacy or Health Dept. Aware to provide a copy of the vaccination record if obtained from local pharmacy or Health Dept. Verbalized acceptance and understanding.  Flu Vaccine status: Up to date  Pneumococcal vaccine status: Up to date  Covid-19 vaccine status: Completed vaccines  Qualifies for Shingles Vaccine? Yes   Zostavax completed Yes   Shingrix Completed?: Yes  Screening Tests Health Maintenance  Topic Date Due   TETANUS/TDAP  06/09/2017   INFLUENZA VACCINE  Completed   COVID-19 Vaccine  Completed   PNA vac Low Risk Adult  Completed    Health Maintenance  Health Maintenance Due  Topic Date Due   TETANUS/TDAP  06/09/2017    Colorectal cancer screening: No longer required.   Mammogram status: Completed 04/22/2020. Repeat every year  Bone Density status: Completed 04/09/2019. Results reflect: Bone density results: OSTEOPOROSIS. Repeat every 0 years.  Lung Cancer Screening: (Low Dose CT Chest recommended if Age 55-80 years, 30 pack-year currently smoking OR have quit w/in 15years.) does not qualify.   Lung Cancer Screening Referral: no  Additional Screening:  Hepatitis C Screening: does not qualify; Completed no  Vision Screening: Recommended annual ophthalmology exams for early detection of glaucoma and other disorders of the eye. Is the patient up to date with their annual eye exam?  Yes  Who is the provider or what  is the name of the office in which the patient attends annual eye exams? Rutherford Guys, MD. If pt is not established with a provider, would they like to be referred to a provider to establish care? No .   Dental Screening: Recommended annual dental exams for proper oral hygiene  Community Resource Referral / Chronic Care Management: CRR required this visit?  No   CCM required this visit?  No      Plan:     I have personally reviewed and noted the following in the patient's chart:   Medical and social history Use of alcohol, tobacco or illicit drugs  Current medications and supplements Functional ability and status Nutritional status Physical activity Advanced directives List of other physicians Hospitalizations, surgeries, and ER visits in previous 12 months Vitals Screenings to include cognitive, depression, and falls Referrals and appointments  In addition, I have reviewed and discussed with patient certain preventive protocols, quality metrics, and best practice recommendations. A written personalized care plan for preventive services as well as general preventive health recommendations were provided to patient.     Sheral Flow, LPN   [id number]   Nurse Notes:  Patient is cogitatively intact. There were no vitals filed for this visit. There is no height or weight on file to calculate BMI. Patient stated that she has no issues with gait or balance; does not use any assistive devices.    Medical screening examination/treatment/procedure(s) were performed by non-physician practitioner and as supervising physician I was immediately available for consultation/collaboration.  I agree with above. Lew Dawes, MD

## 2020-08-08 NOTE — Patient Instructions (Addendum)
[name] , Thank you for taking time to come for your Medicare Wellness Visit. I appreciate your ongoing commitment to your health goals. Please review the following plan we discussed and let me know if I can assist you in the future.   Screening recommendations/referrals: Colonoscopy: not a candidate for colon cancer screening due to age 85: 04/22/2020 Bone Density: 04/09/2019 Recommended yearly ophthalmology/optometry visit for glaucoma screening and checkup Recommended yearly dental visit for hygiene and checkup  Vaccinations: Influenza vaccine: 04/28/2020 Pneumococcal vaccine: up to date Tdap vaccine: overdue Shingles vaccine: up to date   Covid-19: up to date  Advanced directives: Please bring a copy of your health care power of attorney and living will to the office at your convenience.  Conditions/risks identified: Yes; Reviewed health maintenance screenings with patient today and relevant education, vaccines, and/or referrals were provided. Please continue to do your personal lifestyle choices by: daily care of teeth and gums, regular physical activity (goal should be 5 days a week for 30 minutes), eat a healthy diet, avoid tobacco and drug use, limiting any alcohol intake, taking a low-dose aspirin (if not allergic or have been advised by your provider otherwise) and taking vitamins and minerals as recommended by your provider. Continue doing brain stimulating activities (puzzles, reading, adult coloring books, staying active) to keep memory sharp. Continue to eat heart healthy diet (full of fruits, vegetables, whole grains, lean protein, water--limit salt, fat, and sugar intake) and increase physical activity as tolerated.  Next appointment: Please schedule your next Medicare Wellness Visit with your Nurse Health Advisor in 1 year by calling [phone].   Preventive Care 60 Years and Older, Female Preventive care refers to lifestyle choices and visits with your health  care provider that can promote health and wellness. What does preventive care include?  A yearly physical exam. This is also called an annual well check.  Dental exams once or twice a year.  Routine eye exams. Ask your health care provider how often you should have your eyes checked.  Personal lifestyle choices, including:  Daily care of your teeth and gums.  Regular physical activity.  Eating a healthy diet.  Avoiding tobacco and drug use.  Limiting alcohol use.  Practicing safe sex.  Taking low-dose aspirin every day.  Taking vitamin and mineral supplements as recommended by your health care provider. What happens during an annual well check? The services and screenings done by your health care provider during your annual well check will depend on your age, overall health, lifestyle risk factors, and family history of disease. Counseling  Your health care provider may ask you questions about your:  Alcohol use.  Tobacco use.  Drug use.  Emotional well-being.  Home and relationship well-being.  Sexual activity.  Eating habits.  History of falls.  Memory and ability to understand (cognition).  Work and work Statistician.  Reproductive health. Screening  You may have the following tests or measurements:  Height, weight, and BMI.  Blood pressure.  Lipid and cholesterol levels. These may be checked every 5 years, or more frequently if you are over 41 years old.  Skin check.  Lung cancer screening. You may have this screening every year starting at age 66 if you have a 30-pack-year history of smoking and currently smoke or have quit within the past 15 years.  Fecal occult blood test (FOBT) of the stool. You may have this test every year starting at age 27.  Flexible sigmoidoscopy or colonoscopy. You may have a sigmoidoscopy  every 5 years or a colonoscopy every 10 years starting at age 91.  Hepatitis C blood test.  Hepatitis B blood test.  Sexually  transmitted disease (STD) testing.  Diabetes screening. This is done by checking your blood sugar (glucose) after you have not eaten for a while (fasting). You may have this done every 1-3 years.  Bone density scan. This is done to screen for osteoporosis. You may have this done starting at age 70.  Mammogram. This may be done every 1-2 years. Talk to your health care provider about how often you should have regular mammograms. Talk with your health care provider about your test results, treatment options, and if necessary, the need for more tests. Vaccines  Your health care provider may recommend certain vaccines, such as:  Influenza vaccine. This is recommended every year.  Tetanus, diphtheria, and acellular pertussis (Tdap, Td) vaccine. You may need a Td booster every 10 years.  Zoster vaccine. You may need this after age 35.  Pneumococcal 13-valent conjugate (PCV13) vaccine. One dose is recommended after age 76.  Pneumococcal polysaccharide (PPSV23) vaccine. One dose is recommended after age 77. Talk to your health care provider about which screenings and vaccines you need and how often you need them. This information is not intended to replace advice given to you by your health care provider. Make sure you discuss any questions you have with your health care provider. Document Released: 07/22/2015 Document Revised: 03/14/2016 Document Reviewed: 04/26/2015 Elsevier Interactive Patient Education  2017 [employer] Prevention in the Home Falls can cause injuries. They can happen to people of all ages. There are many things you can do to make your home safe and to help prevent falls. What can I do on the outside of my home?  Regularly fix the edges of walkways and driveways and fix any cracks.  Remove anything that might make you trip as you walk through a door, such as a raised step or threshold.  Trim any bushes or trees on the path to your home.  Use bright outdoor  lighting.  Clear any walking paths of anything that might make someone trip, such as rocks or tools.  Regularly check to see if handrails are loose or broken. Make sure that both sides of any steps have handrails.  Any raised decks and porches should have guardrails on the edges.  Have any leaves, snow, or ice cleared regularly.  Use sand or salt on walking paths during winter.  Clean up any spills in your garage right away. This includes oil or grease spills. What can I do in the bathroom?  Use night lights.  Install grab bars by the toilet and in the tub and shower. Do not use towel bars as grab bars.  Use non-skid mats or decals in the tub or shower.  If you need to sit down in the shower, use a plastic, non-slip stool.  Keep the floor dry. Clean up any water that spills on the floor as soon as it happens.  Remove soap buildup in the tub or shower regularly.  Attach bath mats securely with double-sided non-slip rug tape.  Do not have throw rugs and other things on the floor that can make you trip. What can I do in the bedroom?  Use night lights.  Make sure that you have a light by your bed that is easy to reach.  Do not use any sheets or blankets that are too big for your bed. They should  not hang down onto the floor.  Have a firm chair that has side arms. You can use this for support while you get dressed.  Do not have throw rugs and other things on the floor that can make you trip. What can I do in the kitchen?  Clean up any spills right away.  Avoid walking on wet floors.  Keep items that you use a lot in easy-to-reach places.  If you need to reach something above you, use a strong step stool that has a grab bar.  Keep electrical cords out of the way.  Do not use floor polish or wax that makes floors slippery. If you must use wax, use non-skid floor wax.  Do not have throw rugs and other things on the floor that can make you trip. What can I do with my  stairs?  Do not leave any items on the stairs.  Make sure that there are handrails on both sides of the stairs and use them. Fix handrails that are broken or loose. Make sure that handrails are as long as the stairways.  Check any carpeting to make sure that it is firmly attached to the stairs. Fix any carpet that is loose or worn.  Avoid having throw rugs at the top or bottom of the stairs. If you do have throw rugs, attach them to the floor with carpet tape.  Make sure that you have a light switch at the top of the stairs and the bottom of the stairs. If you do not have them, ask someone to add them for you. What else can I do to help prevent falls?  Wear shoes that:  Do not have high heels.  Have rubber bottoms.  Are comfortable and fit you well.  Are closed at the toe. Do not wear sandals.  If you use a stepladder:  Make sure that it is fully opened. Do not climb a closed stepladder.  Make sure that both sides of the stepladder are locked into place.  Ask someone to hold it for you, if possible.  Clearly mark and make sure that you can see:  Any grab bars or handrails.  First and last steps.  Where the edge of each step is.  Use tools that help you move around (mobility aids) if they are needed. These include:  Canes.  Walkers.  Scooters.  Crutches.  Turn on the lights when you go into a dark area. Replace any light bulbs as soon as they burn out.  Set up your furniture so you have a clear path. Avoid moving your furniture around.  If any of your floors are uneven, fix them.  If there are any pets around you, be aware of where they are.  Review your medicines with your doctor. Some medicines can make you feel dizzy. This can increase your chance of falling. Ask your doctor what other things that you can do to help prevent falls. This information is not intended to replace advice given to you by your health care provider. Make sure you discuss any  questions you have with your health care provider. Document Released: 04/21/2009 Document Revised: 12/01/2015 Document Reviewed: 07/30/2014 Elsevier Interactive Patient Education  2017 [employer].

## 2020-08-09 ENCOUNTER — Telehealth: Payer: Self-pay | Admitting: Internal Medicine

## 2020-08-09 NOTE — Telephone Encounter (Signed)
Cindy Meza is calling requesting to speak with the nurse in regards to some medication questions. Please advise.

## 2020-08-09 NOTE — Telephone Encounter (Signed)
GO back to once daily and see how feels

## 2020-08-09 NOTE — Telephone Encounter (Signed)
Patient has been feeling off balance, nauseated, jittery, dizzy, tight in chest   Symptoms are not new but she thinks this is worse since increasing florinef to twice a day. Today: 126/76, 76. Adding salt to foods and eating salty snacks.  Drinking between 40-50 oz of water daily consistently.  Gets up several times at night for bathroom.  Aware that I am forwarding to Dr. Harrington Challenger for recommendations and will call her back. I adv to go back to just once daily florinef until she hears back.  She wants to know if there is an alternative to florinef she can take.

## 2020-08-10 MED ORDER — FLUDROCORTISONE ACETATE 0.1 MG PO TABS: 0.0500 mg | ORAL_TABLET | Freq: Every day | ORAL | 3 refills | Status: DC

## 2020-08-10 NOTE — Telephone Encounter (Signed)
Patient has been informed and voices understanding to decrease florinef to 1/2 tablet daily and see how she feels.

## 2020-08-14 NOTE — Assessment & Plan Note (Addendum)
Continue with Prolia, vitamin D, weightbearing exercise Try to gain more weight

## 2020-08-14 NOTE — Assessment & Plan Note (Signed)
Take vitamin D

## 2020-08-14 NOTE — Assessment & Plan Note (Signed)
Continue with Advair

## 2020-08-14 NOTE — Assessment & Plan Note (Signed)
Continue with AcipHex and Pepcid

## 2020-08-14 NOTE — Assessment & Plan Note (Signed)
Hydrate well.  Try to gain more weight.  Continue with Florinef

## 2020-08-16 ENCOUNTER — Ambulatory Visit: Payer: Medicare Other | Attending: Family Medicine

## 2020-08-16 ENCOUNTER — Other Ambulatory Visit: Payer: Self-pay

## 2020-08-16 DIAGNOSIS — R262 Difficulty in walking, not elsewhere classified: Secondary | ICD-10-CM | POA: Insufficient documentation

## 2020-08-16 DIAGNOSIS — M6281 Muscle weakness (generalized): Secondary | ICD-10-CM | POA: Diagnosis present

## 2020-08-16 DIAGNOSIS — M545 Low back pain, unspecified: Secondary | ICD-10-CM | POA: Diagnosis present

## 2020-08-17 NOTE — Therapy (Signed)
[place], [place], [address] Phone: [phone]   Fax:  [phone]  Physical Therapy Evaluation  Patient Details  Name: Cindy Meza MRN: [id number] Date of Birth: [birth year] Referring Provider (PT): Lyndal Pulley, DO   Encounter Date: 08/16/2020   PT End of Session - 08/17/20 0655    Visit Number 1    Number of Visits 13    Date for PT Re-Evaluation 10/07/20    Authorization Type UHC MEDICARE    PT Start Time 1351    PT Stop Time 1440    PT Time Calculation (min) 49 min    Activity Tolerance Patient tolerated treatment well    Behavior During Therapy [facility] for tasks assessed/performed           Past Medical History:  Diagnosis Date  . Allergy   . Anemia   . Asthma    Dr. Melvyn Novas  . Blood transfusion without reported diagnosis   . Breast cyst    Left  . Cataract    Dr. Gershon Crane  . Complication of anesthesia    ' I HAD HALLUCINATIONS WHEN THEY DID MY HIP "  . GERD (gastroesophageal reflux disease)   . Heart murmur   . Hemorrhoids   . History of colonic polyps [name]   hx of adenomas (small) 1998  . OA (osteoarthritis)   . Orthostatic hypotension   . Osteopenia   . PAC (premature atrial contraction)   . Vitamin D deficiency     Past Surgical History:  Procedure Laterality Date  . ABDOMINAL HYSTERECTOMY     complete  . APPENDECTOMY    . BREAST CYST EXCISION    . CATARACT EXTRACTION, BILATERAL     04/23/18 left eye, 04/02/18 right eye  . COLONOSCOPY  Multiple   Adenomatous colon polyps  . ESOPHAGOGASTRODUODENOSCOPY  2010  . INTRAMEDULLARY (IM) NAIL INTERTROCHANTERIC Right 06/13/2016   Procedure: INTRAMEDULLARY (IM) NAIL INTERTROCHANTRIC;  Surgeon: Tania Ade, MD;  Location: [place];  Service: Orthopedics;  Laterality: Right;  . TONSILLECTOMY      There were no vitals filed for this visit.    Subjective Assessment - 08/17/20 0646    Subjective Pt reports intermittent  low back pain for greater than 6 months with prolonged standing and walking ativities    Pertinent History OA, osteopenia, ortthostatic hypotension,  INTRAMEDULLARY (IM) NAIL INTERTROCHANTERIC 2017 after fall in yard    Limitations Standing;Walking    Diagnostic tests 07/20/20 Xray:    FINDINGS:  Five lumbar type vertebra. There is no acute fracture or subluxation  of the lumbar spine. Mild osteopenia and degenerative changes. The  visualized posterior elements are intact. The soft tissues are  unremarkable.     IMPRESSION:  No acute/traumatic lumbar spine pathology.    Patient Stated Goals To tolerte activity with less back pain.    Currently in Pain? Yes   0/10 during eval   Pain Score 6     Pain Location Back    Pain Orientation Posterior;Lower    Pain Descriptors / Indicators Aching    Pain Type Chronic pain    Pain Radiating Towards NA    Pain Onset More than a month ago    Aggravating Factors  prolonged walking or standing activities    Pain Relieving Factors sitting or lying down    Effect of Pain on Daily Activities i'm able to kept going  [facility] PT Assessment - 08/17/20 0001      Assessment   Medical Diagnosis Lumbar spine pain    Referring Provider (PT) Lyndal Pulley, DO    Onset Date/Surgical Date --   greater than 6 months   Hand Dominance Right    Prior Therapy No      Precautions   Precautions None      Restrictions   Weight Bearing Restrictions No    Other Position/Activity Restrictions Hypotension      Balance Screen   Has the patient fallen in the past 6 months No    Has the patient had a decrease in activity level because of a fear of falling?  No    Is the patient reluctant to leave their home because of a fear of falling?  No      Home Environment   Living Environment Private residence    Living Arrangements Alone    Type of [place] to enter    Entrance Stairs-Number of Steps 4    Entrance Stairs-Rails Left     Home Layout One level   1 step s rail     Prior Function   Level of Independence Independent    Vocation Retired      Associate Professor   Overall Cognitive Status Within Functional Limits for tasks assessed    Memory Appears intact      Observation/Other Assessments   Focus on Therapeutic Outcomes (FOTO)  51% limitation      Sensation   Light Touch Appears Intact      Posture/Postural Control   Posture/Postural Control Postural limitations    Postural Limitations Forward head      ROM / Strength   AROM / PROM / Strength AROM;Strength      AROM   Overall AROM Comments Pt completed trunk movements without provocation of low back pain. ROMs are within normal limits for he age    AROM Assessment Site Lumbar    Lumbar Flexion 25% limitation    Lumbar Extension 25% lmitation    Lumbar - Right Side Bend WFLs    Lumbar - Left Side Bend WNLs    Lumbar - Right Rotation WFLs    Lumbar - Left Rotation WNLs      Strength   Strength Assessment Site Hip;Knee;Ankle    Right/Left Hip Right;Left    Right Hip Flexion 4/5    Right Hip Extension 3+/5    Right Hip External Rotation  3+/5    Right Hip Internal Rotation 4/5    Right Hip ABduction 3+/5    Right Hip ADduction 4/5    Left Hip Flexion 4/5    Left Hip Extension 3+/5    Left Hip External Rotation 3+/5    Left Hip Internal Rotation 4/5    Left Hip ABduction 3+/5    Left Hip ADduction 4/5    Right/Left Knee Right;Left    Right Knee Flexion 4/5    Right Knee Extension 4/5    Left Knee Flexion 4/5    Left Knee Extension 4/5    Right/Left Ankle Right;Left    Right Ankle Dorsiflexion 5/5    Right Ankle Plantar Flexion 5/5    Right Ankle Inversion 4/5    Right Ankle Eversion 4/5    Left Ankle Dorsiflexion 5/5    Left Ankle Plantar Flexion 5/5    Left Ankle Inversion 4/5    Left Ankle Eversion 4/5  Transfers   Transfers Sit to Stand;Stand to Sit    Sit to Stand 7: Independent    Comments Pt counts to 10 prior to walking due  to orthostatic hypotension      Ambulation/Gait   Ambulation/Gait Yes    Ambulation/Gait Assistance 7: Independent    Gait Pattern Within Functional Limits;Step-through pattern    Gait velocity Decreased pace                      Objective measurements completed on examination: See above findings.               PT Education - 08/17/20 2706    Education Details Eval findings, POC, HEP    Person(s) Educated Patient    Methods Explanation;Demonstration;Tactile cues;Verbal cues;Handout    Comprehension Verbalized understanding;Returned demonstration;Verbal cues required;Tactile cues required;Need further instruction            PT Short Term Goals - 08/17/20 0709      PT SHORT TERM GOAL #1   Title Pt will be Ind in an initial HEP    Baseline started on eval    Status New    Target Date 09/07/20             PT Long Term Goals - 08/17/20 0710      PT LONG TERM GOAL #1   Title Pt will be Ind in a final HEP to maintain or progress achieved funtional mobility    Status New    Target Date 10/07/20      PT LONG TERM GOAL #2   Title Increase bilat hip strength to 4/5 or greater, and bilat knee and ankle strength to 4+/5 or greater for improved functional mobility and safety    Status New    Target Date 10/07/20      PT LONG TERM GOAL #3   Title Improved FOTO score to 61% functional ability    Baseline 51%    Status New    Target Date 10/07/20      PT LONG TERM GOAL #4   Title Pt will report 50% improvement in her low back pain associated with activity on her feet or the ability to be active on her feet longer.    Baseline 6/10    Status New    Target Date 10/07/20      PT LONG TERM GOAL #5   Title Pt will demonstrate proper body mechanics for household activites to help minimize low back aggrevation with activity    Status New    Target Date 10/07/20                  Plan - 08/17/20 0649    Clinical Impression Statement Pt presents  to PT with low back which develops with prolonged standing and walking activities. Eval revealed trunk ROMs consistent with the pt's age. Physically pt presents as somewhat frail and has decreased strength for her LEs and trunk. Pt will benefit from PT to increase her trunk and LE strength to decrease low back pain associated with activity and to optimize her functional mobility and safety.    Personal Factors and Comorbidities Comorbidity 3+;Past/Current Experience;Time since onset of injury/illness/exacerbation    Comorbidities OA, osteopenia, ortthostatic hypotension, R femur, R INTRAMEDULLARY (IM) NAIL INTERTROCHANTERIC 2017 after fall in yard    Examination-Activity Limitations Bend;Squat;Stand;Stairs;Locomotion Level;Lift    Examination-Participation Restrictions Cleaning;Laundry    Stability/Clinical Decision Making Stable/Uncomplicated    Clinical Decision Making Low  Rehab Potential Good    PT Frequency 2x / week    PT Duration 6 weeks    PT Treatment/Interventions ADLs/Self Care Home Management;Cryotherapy;Electrical Stimulation;Ultrasound;Moist Heat;Iontophoresis 4mg /ml Dexamethasone;Functional mobility training;Therapeutic activities;Therapeutic exercise;Balance training;Manual techniques;Patient/family education;Passive range of motion;Dry needling;Taping    PT Next Visit Plan Assess response to HEP; Review FOTO, assess Berg and set goal.    [place] and Agree with Plan of Care Patient           Patient will benefit from skilled therapeutic intervention in order to improve the following deficits and impairments:  Difficulty walking,Pain,Decreased activity tolerance,Decreased strength,Postural dysfunction,Decreased balance  Visit Diagnosis: Lumbar spine pain  Muscle weakness (generalized)  Difficulty in walking, not elsewhere classified     Problem List Patient Active Problem List   Diagnosis Date Noted  . Low back pain 07/20/2020  .  Constipation 04/29/2020  . Anxiety 01/03/2020  . Rash 07/29/2019  . Nausea 02/12/2019  . Aortic regurgitation 06/16/2018  . Fall 06/12/2016  . Closed right hip fracture ([place]) 06/12/2016  . Cerumen impaction 04/06/2016  . Fatigue 02/15/2016  . DOE (dyspnea on exertion) 02/15/2016  . Headache 09/27/2015  . Coronary atherosclerosis 06/29/2015  . Urgency of urination 05/17/2015  . Abdominal pain 03/30/2015  . Paresthesias 09/21/2014  . Breast pain, left 09/21/2014  . Chest pressure 07/22/2012  . Lightheadedness 07/17/2012  . Orthostatic hypotension 07/17/2012  . Hot flash, menopausal 02/15/2012  . Allergic rhinitis 09/12/2011  . Well adult exam 02/14/2011  . HEMORRHOIDS, WITH BLEEDING 10/05/2009  . Vitamin D deficiency 06/17/2007  . Asthma 06/17/2007  . GERD (gastroesophageal reflux disease) 06/17/2007  . OSTEOARTHRITIS 06/17/2007  . Osteoporosis 06/17/2007  . COLONIC POLYPS, HX OF 06/17/2007    Cindy Ponto MS, PT 08/17/20 7:23 AM  [place] [facility] [address] [place], [place], [address] Phone: [phone]   Fax:  [phone]  Name: Cindy Meza MRN: [id number] Date of Birth: 06-Jul-1936

## 2020-08-23 ENCOUNTER — Other Ambulatory Visit: Payer: Self-pay

## 2020-08-23 ENCOUNTER — Ambulatory Visit: Payer: Medicare Other

## 2020-08-23 DIAGNOSIS — M545 Low back pain, unspecified: Secondary | ICD-10-CM | POA: Diagnosis not present

## 2020-08-23 DIAGNOSIS — M6281 Muscle weakness (generalized): Secondary | ICD-10-CM

## 2020-08-23 DIAGNOSIS — R262 Difficulty in walking, not elsewhere classified: Secondary | ICD-10-CM

## 2020-08-23 NOTE — Therapy (Signed)
[place], [place], [address] Phone: [phone]   Fax:  [phone]  Physical Therapy Treatment  Patient Details  Name: Cindy Meza MRN: [id number] Date of Birth: 30-Oct-1935 Referring Provider (PT): Lyndal Pulley, DO   Encounter Date: 08/23/2020   PT End of Session - 08/23/20 1518    Visit Number 2    Number of Visits 13    Date for PT Re-Evaluation 10/07/20    Authorization Type UHC MEDICARE    PT Start Time 1450    PT Stop Time 1532    PT Time Calculation (min) 42 min    Activity Tolerance Patient tolerated treatment well    Behavior During Therapy [facility] for tasks assessed/performed           Past Medical History:  Diagnosis Date  . Allergy   . Anemia   . Asthma    Dr. Melvyn Novas  . Blood transfusion without reported diagnosis   . Breast cyst    Left  . Cataract    Dr. Gershon Crane  . Complication of anesthesia    ' I HAD HALLUCINATIONS WHEN THEY DID MY HIP "  . GERD (gastroesophageal reflux disease)   . Heart murmur   . Hemorrhoids   . History of colonic polyps [name]   hx of adenomas (small) 1998  . OA (osteoarthritis)   . Orthostatic hypotension   . Osteopenia   . PAC (premature atrial contraction)   . Vitamin D deficiency     Past Surgical History:  Procedure Laterality Date  . ABDOMINAL HYSTERECTOMY     complete  . APPENDECTOMY    . BREAST CYST EXCISION    . CATARACT EXTRACTION, BILATERAL     04/23/18 left eye, 04/02/18 right eye  . COLONOSCOPY  Multiple   Adenomatous colon polyps  . ESOPHAGOGASTRODUODENOSCOPY  2010  . INTRAMEDULLARY (IM) NAIL INTERTROCHANTERIC Right 06/13/2016   Procedure: INTRAMEDULLARY (IM) NAIL INTERTROCHANTRIC;  Surgeon: Tania Ade, MD;  Location: [place];  Service: Orthopedics;  Laterality: Right;  . TONSILLECTOMY      There were no vitals filed for this visit.   Subjective Assessment - 08/23/20 1454    Subjective Patient reports her back is  ok, "it's a sporadic thing." She reports that she has not completed her HEP and has some questions about them because she isn't sure if she should do them on the floor and doesn't think she could get up from the floor if she did her exercises there.    Pertinent History OA, osteopenia, ortthostatic hypotension,  INTRAMEDULLARY (IM) NAIL INTERTROCHANTERIC 2017 after fall in yard    Limitations Standing;Walking    Diagnostic tests 07/20/20 Xray:    FINDINGS:  Five lumbar type vertebra. There is no acute fracture or subluxation  of the lumbar spine. Mild osteopenia and degenerative changes. The  visualized posterior elements are intact. The soft tissues are  unremarkable.     IMPRESSION:  No acute/traumatic lumbar spine pathology.    Patient Stated Goals To tolerte activity with less back pain.    Currently in Pain? No/denies    Pain Onset More than a month ago              [facility] PT Assessment - 08/23/20 0001      Balance   Balance Assessed Yes      Standardized Balance Assessment   Standardized Balance Assessment Berg Balance Test      Berg Balance Test   Sit  to Stand Able to stand without using hands and stabilize independently    Standing Unsupported Able to stand safely 2 minutes    Sitting with Back Unsupported but Feet Supported on Floor or Stool Able to sit safely and securely 2 minutes    Stand to Sit Sits safely with minimal use of hands    Transfers Able to transfer safely, minor use of hands    Standing Unsupported with Eyes Closed Able to stand 10 seconds safely    Standing Unsupported with Feet Together Able to place feet together independently and stand 1 minute safely    From Standing, Reach Forward with Outstretched Arm Can reach confidently >25 cm (10")    From Standing Position, Pick up Object from Floor Able to pick up shoe safely and easily    From Standing Position, Turn to Look Behind Over each Shoulder Looks behind one side only/other side shows less weight shift     Turn 360 Degrees Able to turn 360 degrees safely in 4 seconds or less    Standing Unsupported, Alternately Place Feet on Step/Stool Able to stand independently and safely and complete 8 steps in 20 seconds    Standing Unsupported, One Foot in Front Able to place foot tandem independently and hold 30 seconds    Standing on One Leg Able to lift leg independently and hold > 10 seconds    Total Score 55                         [facility] Adult PT Treatment/Exercise - 08/23/20 0001      Self-Care   Self-Care Other Self-Care Comments    Other Self-Care Comments  see patient education      Lumbar Exercises: Supine   Pelvic Tilt Limitations 2  x10    Clam Limitations 2 x 10    Bridge Limitations 2 x 15    Other Supine Lumbar Exercises hip adduction isometric 2 x 10                  PT Education - 08/23/20 1519    Education Details FOTO score and anticipated progress. Berg balance assessment findings. Review HEP and recommendation to complete on her bed as opposed to the floor. Green theraband issued    Person(s) Educated Patient    Methods Explanation;Demonstration;Verbal cues    Comprehension Verbalized understanding;Returned demonstration;Verbal cues required            PT Short Term Goals - 08/17/20 0709      PT SHORT TERM GOAL #1   Title Pt will be Ind in an initial HEP    Baseline started on eval    Status New    Target Date 09/07/20             PT Long Term Goals - 08/17/20 0710      PT LONG TERM GOAL #1   Title Pt will be Ind in a final HEP to maintain or progress achieved funtional mobility    Status New    Target Date 10/07/20      PT LONG TERM GOAL #2   Title Increase bilat hip strength to 4/5 or greater, and bilat knee and ankle strength to 4+/5 or greater for improved functional mobility and safety    Status New    Target Date 10/07/20      PT LONG TERM GOAL #3   Title Improved FOTO score to 61% functional ability  Baseline 51%     Status New    Target Date 10/07/20      PT LONG TERM GOAL #4   Title Pt will report 50% improvement in her low back pain associated with activity on her feet or the ability to be active on her feet longer.    Baseline 6/10    Status New    Target Date 10/07/20      PT LONG TERM GOAL #5   Title Pt will demonstrate proper body mechanics for household activites to help minimize low back aggrevation with activity    Status New    Target Date 10/07/20                 Plan - 08/23/20 1513    Clinical Impression Statement Patient scored 55/56 on Berg Balance Test scoring in a low fall risk category at this time. Time spent reviewing HEP with recommendation to perform exercises on her bed as opposed to on the floor as patient is concerned that she would not be able to get up from the floor independently. Overall good tolerance to today's session without reports of pain.    Personal Factors and Comorbidities Comorbidity 3+;Past/Current Experience;Time since onset of injury/illness/exacerbation    Comorbidities OA, osteopenia, ortthostatic hypotension, R femur, R INTRAMEDULLARY (IM) NAIL INTERTROCHANTERIC 2017 after fall in yard    Examination-Activity Limitations Bend;Squat;Stand;Stairs;Locomotion Level;Lift    Examination-Participation Restrictions Cleaning;Laundry    Stability/Clinical Decision Making Stable/Uncomplicated    Rehab Potential Good    PT Frequency 2x / week    PT Duration 6 weeks    PT Treatment/Interventions ADLs/Self Care Home Management;Cryotherapy;Electrical Stimulation;Ultrasound;Moist Heat;Iontophoresis 4mg /ml Dexamethasone;Functional mobility training;Therapeutic activities;Therapeutic exercise;Balance training;Manual techniques;Patient/family education;Passive range of motion;Dry needling;Taping    PT Next Visit Plan progress core/hip strengthening as tolerated.    PT Home Exercise Plan [id number]    Consulted and Agree with Plan of Care Patient            Patient will benefit from skilled therapeutic intervention in order to improve the following deficits and impairments:  Difficulty walking,Pain,Decreased activity tolerance,Decreased strength,Postural dysfunction,Decreased balance  Visit Diagnosis: Lumbar spine pain  Muscle weakness (generalized)  Difficulty in walking, not elsewhere classified     Problem List Patient Active Problem List   Diagnosis Date Noted  . Low back pain 07/20/2020  . Constipation 04/29/2020  . Anxiety 01/03/2020  . Rash 07/29/2019  . Nausea 02/12/2019  . Aortic regurgitation 06/16/2018  . Fall 06/12/2016  . Closed right hip fracture ([place]) 06/12/2016  . Cerumen impaction 04/06/2016  . Fatigue 02/15/2016  . DOE (dyspnea on exertion) 02/15/2016  . Headache 09/27/2015  . Coronary atherosclerosis 06/29/2015  . Urgency of urination 05/17/2015  . Abdominal pain 03/30/2015  . Paresthesias 09/21/2014  . Breast pain, left 09/21/2014  . Chest pressure 07/22/2012  . Lightheadedness 07/17/2012  . Orthostatic hypotension 07/17/2012  . Hot flash, menopausal 02/15/2012  . Allergic rhinitis 09/12/2011  . Well adult exam 02/14/2011  . HEMORRHOIDS, WITH BLEEDING 10/05/2009  . Vitamin D deficiency 06/17/2007  . Asthma 06/17/2007  . GERD (gastroesophageal reflux disease) 06/17/2007  . OSTEOARTHRITIS 06/17/2007  . Osteoporosis 06/17/2007  . COLONIC POLYPS, HX OF 06/17/2007   Gwendolyn Grant, PT, DPT, ATC 08/23/20 3:36 PM  [facility] [address] [place], [place], [address] Phone: [phone]   Fax:  [phone]  Name: Cindy Meza MRN: [id number] Date of Birth: 22-Apr-1936

## 2020-08-25 ENCOUNTER — Other Ambulatory Visit: Payer: Self-pay

## 2020-08-25 ENCOUNTER — Ambulatory Visit: Payer: Medicare Other

## 2020-08-25 DIAGNOSIS — M545 Low back pain, unspecified: Secondary | ICD-10-CM

## 2020-08-25 DIAGNOSIS — M6281 Muscle weakness (generalized): Secondary | ICD-10-CM

## 2020-08-25 DIAGNOSIS — R262 Difficulty in walking, not elsewhere classified: Secondary | ICD-10-CM

## 2020-08-25 NOTE — Therapy (Signed)
[place], [place], [address] Phone: [phone]   Fax:  [phone]  Physical Therapy Treatment  Patient Details  Name: Cindy Meza MRN: [id number] Date of Birth: May 06, 1936 Referring Provider (PT): Lyndal Pulley, DO   Encounter Date: 08/25/2020   PT End of Session - 08/25/20 1446    Visit Number 3    Number of Visits 13    Date for PT Re-Evaluation 10/07/20    Authorization Type UHC MEDICARE    PT Start Time 1401    PT Stop Time 1962    PT Time Calculation (min) 44 min    Activity Tolerance Patient tolerated treatment well    Behavior During Therapy [facility] for tasks assessed/performed           Past Medical History:  Diagnosis Date  . Allergy   . Anemia   . Asthma    Dr. Melvyn Novas  . Blood transfusion without reported diagnosis   . Breast cyst    Left  . Cataract    Dr. Gershon Crane  . Complication of anesthesia    ' I HAD HALLUCINATIONS WHEN THEY DID MY HIP "  . GERD (gastroesophageal reflux disease)   . Heart murmur   . Hemorrhoids   . History of colonic polyps [name]   hx of adenomas (small) 1998  . OA (osteoarthritis)   . Orthostatic hypotension   . Osteopenia   . PAC (premature atrial contraction)   . Vitamin D deficiency     Past Surgical History:  Procedure Laterality Date  . ABDOMINAL HYSTERECTOMY     complete  . APPENDECTOMY    . BREAST CYST EXCISION    . CATARACT EXTRACTION, BILATERAL     04/23/18 left eye, 04/02/18 right eye  . COLONOSCOPY  Multiple   Adenomatous colon polyps  . ESOPHAGOGASTRODUODENOSCOPY  2010  . INTRAMEDULLARY (IM) NAIL INTERTROCHANTERIC Right 06/13/2016   Procedure: INTRAMEDULLARY (IM) NAIL INTERTROCHANTRIC;  Surgeon: Tania Ade, MD;  Location: [place];  Service: Orthopedics;  Laterality: Right;  . TONSILLECTOMY      There were no vitals filed for this visit.   Subjective Assessment - 08/25/20 1405    Subjective Pt thinks her low back is  feeling a little better.    Pertinent History OA, osteopenia, ortthostatic hypotension,  INTRAMEDULLARY (IM) NAIL INTERTROCHANTERIC 2017 after fall in yard    Limitations Standing;Walking    Diagnostic tests 07/20/20 Xray:    FINDINGS:  Five lumbar type vertebra. There is no acute fracture or subluxation  of the lumbar spine. Mild osteopenia and degenerative changes. The  visualized posterior elements are intact. The soft tissues are  unremarkable.     IMPRESSION:  No acute/traumatic lumbar spine pathology.    Patient Stated Goals To tolerte activity with less back pain.    Currently in Pain? No/denies    Pain Score 0-No pain    Pain Location Back    Pain Orientation Posterior;Lower    Pain Descriptors / Indicators Aching    Pain Type Chronic pain    Pain Onset More than a month ago    Aggravating Factors  prolonged walking or standing activities    Pain Relieving Factors sitting or lying down    Effect of Pain on Daily Activities i'm able to kept going                             [facility] Adult PT  Treatment/Exercise - 08/25/20 0001      Exercises   Exercises Lumbar;Knee/Hip      Lumbar Exercises: Standing   Row Both;15 reps   2sets   Theraband Level (Row) Level 2 (Red)      Lumbar Exercises: Seated   Sit to Stand 10 reps    Sit to Stand Limitations s use of hands      Lumbar Exercises: Supine   Pelvic Tilt Limitations 2  x10    Clam Limitations 2 x 10    Bridge Limitations 2 x 15    Other Supine Lumbar Exercises hip adduction isometric 2 x 10      Knee/Hip Exercises: Seated   Long Arc Quad Right;Left;15 reps;2 sets                  PT Education - 08/25/20 1445    Education Details HEP additional exs for trunk and LE strengthneing    Person(s) Educated Patient    Methods Explanation;Demonstration;Tactile cues;Verbal cues;Handout    Comprehension Need further instruction;Tactile cues required;Verbal cues required;Returned demonstration;Verbalized  understanding            PT Short Term Goals - 08/17/20 0709      PT SHORT TERM GOAL #1   Title Pt will be Ind in an initial HEP    Baseline started on eval    Status New    Target Date 09/07/20             PT Long Term Goals - 08/17/20 0710      PT LONG TERM GOAL #1   Title Pt will be Ind in a final HEP to maintain or progress achieved funtional mobility    Status New    Target Date 10/07/20      PT LONG TERM GOAL #2   Title Increase bilat hip strength to 4/5 or greater, and bilat knee and ankle strength to 4+/5 or greater for improved functional mobility and safety    Status New    Target Date 10/07/20      PT LONG TERM GOAL #3   Title Improved FOTO score to 61% functional ability    Baseline 51%    Status New    Target Date 10/07/20      PT LONG TERM GOAL #4   Title Pt will report 50% improvement in her low back pain associated with activity on her feet or the ability to be active on her feet longer.    Baseline 6/10    Status New    Target Date 10/07/20      PT LONG TERM GOAL #5   Title Pt will demonstrate proper body mechanics for household activites to help minimize low back aggrevation with activity    Status New    Target Date 10/07/20                 Plan - 08/25/20 1721    Clinical Impression Statement PT was completed for lumbopelvic and LE strengthening. Additional ther ex were completed and added to the pt's HEP. Pt returned demonstration of the exs.. Pt states she has been completing her HEP consistently on the bed. Pt tolerated the PT session without adverse effects    Personal Factors and Comorbidities Comorbidity 3+;Past/Current Experience;Time since onset of injury/illness/exacerbation    Comorbidities OA, osteopenia, ortthostatic hypotension, R femur, R INTRAMEDULLARY (IM) NAIL INTERTROCHANTERIC 2017 after fall in yard    Examination-Activity Limitations Bend;Squat;Stand;Stairs;Locomotion Level;Lift    Examination-Participation  Restrictions  Cleaning;Laundry    Stability/Clinical Decision Making Stable/Uncomplicated    Clinical Decision Making Moderate    Rehab Potential Good    PT Frequency 2x / week    PT Duration 6 weeks    PT Treatment/Interventions ADLs/Self Care Home Management;Cryotherapy;Electrical Stimulation;Ultrasound;Moist Heat;Iontophoresis 4mg /ml Dexamethasone;Functional mobility training;Therapeutic activities;Therapeutic exercise;Balance training;Manual techniques;Patient/family education;Passive range of motion;Dry needling;Taping    PT Next Visit Plan progress core/hip strengthening as tolerated. Review proper body mechanics for household activities.    PT Home Exercise Plan [email or web address]    Consulted and Agree with Plan of Care Patient           Patient will benefit from skilled therapeutic intervention in order to improve the following deficits and impairments:  Difficulty walking,Pain,Decreased activity tolerance,Decreased strength,Postural dysfunction,Decreased balance  Visit Diagnosis: Lumbar spine pain  Muscle weakness (generalized)  Difficulty in walking, not elsewhere classified     Problem List Patient Active Problem List   Diagnosis Date Noted  . Low back pain 07/20/2020  . Constipation 04/29/2020  . Anxiety 01/03/2020  . Rash 07/29/2019  . Nausea 02/12/2019  . Aortic regurgitation 06/16/2018  . Fall 06/12/2016  . Closed right hip fracture ([place]) 06/12/2016  . Cerumen impaction 04/06/2016  . Fatigue 02/15/2016  . DOE (dyspnea on exertion) 02/15/2016  . Headache 09/27/2015  . Coronary atherosclerosis 06/29/2015  . Urgency of urination 05/17/2015  . Abdominal pain 03/30/2015  . Paresthesias 09/21/2014  . Breast pain, left 09/21/2014  . Chest pressure 07/22/2012  . Lightheadedness 07/17/2012  . Orthostatic hypotension 07/17/2012  . Hot flash, menopausal 02/15/2012  . Allergic rhinitis 09/12/2011  . Well adult exam 02/14/2011  . HEMORRHOIDS, WITH BLEEDING 10/05/2009  .  Vitamin D deficiency 06/17/2007  . Asthma 06/17/2007  . GERD (gastroesophageal reflux disease) 06/17/2007  . OSTEOARTHRITIS 06/17/2007  . Osteoporosis 06/17/2007  . COLONIC POLYPS, HX OF 06/17/2007   Gar Ponto MS, PT 08/25/20 5:31 PM  [facility] [address] [place], [place], [address] Phone: [phone]   Fax:  [phone]  Name: Cindy Meza MRN: [id number] Date of Birth: Jan 02, 1936

## 2020-08-30 ENCOUNTER — Other Ambulatory Visit: Payer: Self-pay

## 2020-08-30 ENCOUNTER — Ambulatory Visit: Payer: Medicare Other

## 2020-08-30 DIAGNOSIS — M545 Low back pain, unspecified: Secondary | ICD-10-CM

## 2020-08-30 DIAGNOSIS — M6281 Muscle weakness (generalized): Secondary | ICD-10-CM

## 2020-08-30 DIAGNOSIS — R262 Difficulty in walking, not elsewhere classified: Secondary | ICD-10-CM

## 2020-08-30 NOTE — Therapy (Signed)
[place], [place], [address] Phone: [phone]   Fax:  [phone]  Physical Therapy Treatment  Patient Details  Name: Cindy Meza MRN: [id number] Date of Birth: 04-26-1936 Referring Provider (PT): Lyndal Pulley, DO   Encounter Date: 08/30/2020   PT End of Session - 08/30/20 1555    Visit Number 4    Number of Visits 13    Date for PT Re-Evaluation 10/07/20    Authorization Type UHC MEDICARE    PT Start Time 1856    PT Stop Time 1618    PT Time Calculation (min) 41 min    Activity Tolerance Patient tolerated treatment well    Behavior During Therapy [facility] for tasks assessed/performed           Past Medical History:  Diagnosis Date  . Allergy   . Anemia   . Asthma    Dr. Melvyn Novas  . Blood transfusion without reported diagnosis   . Breast cyst    Left  . Cataract    Dr. Gershon Crane  . Complication of anesthesia    ' I HAD HALLUCINATIONS WHEN THEY DID MY HIP "  . GERD (gastroesophageal reflux disease)   . Heart murmur   . Hemorrhoids   . History of colonic polyps [name]   hx of adenomas (small) 1998  . OA (osteoarthritis)   . Orthostatic hypotension   . Osteopenia   . PAC (premature atrial contraction)   . Vitamin D deficiency     Past Surgical History:  Procedure Laterality Date  . ABDOMINAL HYSTERECTOMY     complete  . APPENDECTOMY    . BREAST CYST EXCISION    . CATARACT EXTRACTION, BILATERAL     04/23/18 left eye, 04/02/18 right eye  . COLONOSCOPY  Multiple   Adenomatous colon polyps  . ESOPHAGOGASTRODUODENOSCOPY  2010  . INTRAMEDULLARY (IM) NAIL INTERTROCHANTERIC Right 06/13/2016   Procedure: INTRAMEDULLARY (IM) NAIL INTERTROCHANTRIC;  Surgeon: Tania Ade, MD;  Location: [place];  Service: Orthopedics;  Laterality: Right;  . TONSILLECTOMY      There were no vitals filed for this visit.   Subjective Assessment - 08/30/20 1541    Subjective Pt reports feeling a little  nauseous after taking her medications this AM. Pt reports only 1 incident of back discomfort last week when completing household activities.    Pertinent History OA, osteopenia, ortthostatic hypotension,  INTRAMEDULLARY (IM) NAIL INTERTROCHANTERIC 2017 after fall in yard    Limitations Standing;Walking    Diagnostic tests 07/20/20 Xray:    FINDINGS:  Five lumbar type vertebra. There is no acute fracture or subluxation  of the lumbar spine. Mild osteopenia and degenerative changes. The  visualized posterior elements are intact. The soft tissues are  unremarkable.     IMPRESSION:  No acute/traumatic lumbar spine pathology.    Patient Stated Goals To tolerte activity with less back pain.    Currently in Pain? No/denies    Pain Score 0-No pain    Pain Location Back    Pain Orientation Posterior;Lower    Pain Descriptors / Indicators Aching    Pain Type Chronic pain    Pain Onset More than a month ago    Aggravating Factors  prolonged walking or standing activities    Pain Relieving Factors sitting or lying down    Effect of Pain on Daily Activities i'm able  to keep going  [facility] Adult PT Treatment/Exercise - 08/30/20 0001      Lumbar Exercises: Seated   Long Arc Quad on Chair Right;Left;2 sets;10 reps    Sit to Stand 10 reps    Sit to Stand Limitations s use of hands      Lumbar Exercises: Supine   Pelvic Tilt Limitations 10x    Clam Limitations 2 x 15   Red Tband   Bent Knee Raise 10 reps   2 sets   Bridge Limitations 2 x 15    Straight Leg Raise 10 reps;3 seconds   2 sets   Other Supine Lumbar Exercises hip adduction isometric 2 x 10, 3 sec    Other Supine Lumbar Exercises Lat. dorsi pullover 10x2; 4 lbs                    PT Short Term Goals - 08/17/20 0709      PT SHORT TERM GOAL #1   Title Pt will be Ind in an initial HEP    Baseline started on eval    Status New    Target Date 09/07/20             PT Long Term  Goals - 08/17/20 0710      PT LONG TERM GOAL #1   Title Pt will be Ind in a final HEP to maintain or progress achieved funtional mobility    Status New    Target Date 10/07/20      PT LONG TERM GOAL #2   Title Increase bilat hip strength to 4/5 or greater, and bilat knee and ankle strength to 4+/5 or greater for improved functional mobility and safety    Status New    Target Date 10/07/20      PT LONG TERM GOAL #3   Title Improved FOTO score to 61% functional ability    Baseline 51%    Status New    Target Date 10/07/20      PT LONG TERM GOAL #4   Title Pt will report 50% improvement in her low back pain associated with activity on her feet or the ability to be active on her feet longer.    Baseline 6/10    Status New    Target Date 10/07/20      PT LONG TERM GOAL #5   Title Pt will demonstrate proper body mechanics for household activites to help minimize low back aggrevation with activity    Status New    Target Date 10/07/20                 Plan - 08/30/20 1630    Clinical Impression Statement Pt participated in PT to address lumbopelvic/upper & mid-back/LE strengthening. Pt tolerated the initiation of upper and mid back strengthening exs. In PT, pt is following her hypotensive precautions to limit lightheadedness and decrease fall risk. Pt's subjective report indicates better tolerance to to household activities. Pt tolerated PT session without adverse effects.    Personal Factors and Comorbidities Comorbidity 3+;Past/Current Experience;Time since onset of injury/illness/exacerbation    Comorbidities OA, osteopenia, ortthostatic hypotension, R femur, R INTRAMEDULLARY (IM) NAIL INTERTROCHANTERIC 2017 after fall in yard    Examination-Activity Limitations Bend;Squat;Stand;Stairs;Locomotion Level;Lift    Examination-Participation Restrictions Cleaning;Laundry    Stability/Clinical Decision Making Stable/Uncomplicated    Clinical Decision Making Moderate    Rehab  Potential Good    PT Frequency 2x / week    PT Duration 6 weeks    PT Treatment/Interventions ADLs/Self  Care Home Management;Cryotherapy;Electrical Stimulation;Ultrasound;Moist Heat;Iontophoresis 4mg /ml Dexamethasone;Functional mobility training;Therapeutic activities;Therapeutic exercise;Balance training;Manual techniques;Patient/family education;Passive range of motion;Dry needling;Taping    PT Next Visit Plan progress core/hip strengthening as tolerated. Review proper body mechanics for household activities.    PT Home Exercise Plan [email or web address]    Consulted and Agree with Plan of Care Patient           Patient will benefit from skilled therapeutic intervention in order to improve the following deficits and impairments:  Difficulty walking,Pain,Decreased activity tolerance,Decreased strength,Postural dysfunction,Decreased balance  Visit Diagnosis: Lumbar spine pain  Muscle weakness (generalized)  Difficulty in walking, not elsewhere classified     Problem List Patient Active Problem List   Diagnosis Date Noted  . Low back pain 07/20/2020  . Constipation 04/29/2020  . Anxiety 01/03/2020  . Rash 07/29/2019  . Nausea 02/12/2019  . Aortic regurgitation 06/16/2018  . Fall 06/12/2016  . Closed right hip fracture ([place]) 06/12/2016  . Cerumen impaction 04/06/2016  . Fatigue 02/15/2016  . DOE (dyspnea on exertion) 02/15/2016  . Headache 09/27/2015  . Coronary atherosclerosis 06/29/2015  . Urgency of urination 05/17/2015  . Abdominal pain 03/30/2015  . Paresthesias 09/21/2014  . Breast pain, left 09/21/2014  . Chest pressure 07/22/2012  . Lightheadedness 07/17/2012  . Orthostatic hypotension 07/17/2012  . Hot flash, menopausal 02/15/2012  . Allergic rhinitis 09/12/2011  . Well adult exam 02/14/2011  . HEMORRHOIDS, WITH BLEEDING 10/05/2009  . Vitamin D deficiency 06/17/2007  . Asthma 06/17/2007  . GERD (gastroesophageal reflux disease) 06/17/2007  . OSTEOARTHRITIS 06/17/2007   . Osteoporosis 06/17/2007  . COLONIC POLYPS, HX OF 06/17/2007    Gar Ponto MS, PT 08/30/20 4:47 PM  [facility] [address] [place], [place], [address] Phone: [phone]   Fax:  [phone]  Name: Cindy Meza MRN: [id number] Date of Birth: [birth year]

## 2020-09-01 ENCOUNTER — Other Ambulatory Visit: Payer: Self-pay

## 2020-09-01 ENCOUNTER — Ambulatory Visit: Payer: Medicare Other

## 2020-09-01 DIAGNOSIS — M545 Low back pain, unspecified: Secondary | ICD-10-CM | POA: Diagnosis not present

## 2020-09-01 DIAGNOSIS — M6281 Muscle weakness (generalized): Secondary | ICD-10-CM

## 2020-09-01 DIAGNOSIS — R262 Difficulty in walking, not elsewhere classified: Secondary | ICD-10-CM

## 2020-09-01 NOTE — Therapy (Signed)
[place], [place], [address] Phone: [phone]   Fax:  [phone]  Physical Therapy Treatment  Patient Details  Name: Cindy Meza MRN: [id number] Date of Birth: 1936/01/21 Referring Provider (PT): Lyndal Pulley, DO   Encounter Date: 09/01/2020   PT End of Session - 09/01/20 1407    Visit Number 5    Number of Visits 13    Date for PT Re-Evaluation 10/07/20    Authorization Type UHC MEDICARE    PT Start Time 2094    PT Stop Time 7096    PT Time Calculation (min) 43 min    Activity Tolerance Patient tolerated treatment well    Behavior During Therapy [facility] for tasks assessed/performed           Past Medical History:  Diagnosis Date  . Allergy   . Anemia   . Asthma    Dr. Melvyn Novas  . Blood transfusion without reported diagnosis   . Breast cyst    Left  . Cataract    Dr. Gershon Crane  . Complication of anesthesia    ' I HAD HALLUCINATIONS WHEN THEY DID MY HIP "  . GERD (gastroesophageal reflux disease)   . Heart murmur   . Hemorrhoids   . History of colonic polyps [name]   hx of adenomas (small) 1998  . OA (osteoarthritis)   . Orthostatic hypotension   . Osteopenia   . PAC (premature atrial contraction)   . Vitamin D deficiency     Past Surgical History:  Procedure Laterality Date  . ABDOMINAL HYSTERECTOMY     complete  . APPENDECTOMY    . BREAST CYST EXCISION    . CATARACT EXTRACTION, BILATERAL     04/23/18 left eye, 04/02/18 right eye  . COLONOSCOPY  Multiple   Adenomatous colon polyps  . ESOPHAGOGASTRODUODENOSCOPY  2010  . INTRAMEDULLARY (IM) NAIL INTERTROCHANTERIC Right 06/13/2016   Procedure: INTRAMEDULLARY (IM) NAIL INTERTROCHANTRIC;  Surgeon: Tania Ade, MD;  Location: [place];  Service: Orthopedics;  Laterality: Right;  . TONSILLECTOMY      There were no vitals filed for this visit.   Subjective Assessment - 09/01/20 1409    Subjective Pt reports she is doing  well today. Pt thinks she is tolerateing more activity at home with less back pain.    Patient Stated Goals To tolerte activity with less back pain.    Currently in Pain? No/denies    Pain Score 0-No pain    Pain Location Abdomen    Pain Orientation Posterior;Lower    Pain Onset More than a month ago                             [facility] Adult PT Treatment/Exercise - 09/01/20 0001      Exercises   Exercises Lumbar;Knee/Hip      Lumbar Exercises: Aerobic   Nustep 5 mins; L5; UEs/LEs      Lumbar Exercises: Supine   Pelvic Tilt Limitations 10x    Clam Limitations 2 x 10   Red Tband   Bent Knee Raise 10 reps   2 sets   Bridge Limitations 2 x 10    Straight Leg Raise 10 reps;3 seconds   2 sets   Other Supine Lumbar Exercises hip adduction isometric 2 x 10, 3 sec    Other Supine Lumbar Exercises Lat. dorsi pullover 10x2; 4 lbs. Shoulder horizonal abd,  then shoulder ER, both  15x2 c yellow tband      Knee/Hip Exercises: Seated   Sit to Sand 10 reps;with UE support                    PT Short Term Goals - 09/01/20 1428      PT SHORT TERM GOAL #1   Title Pt will be Ind in an initial HEP    Baseline started on eval    Status Achieved    Target Date 09/01/20             PT Long Term Goals - 08/17/20 0710      PT LONG TERM GOAL #1   Title Pt will be Ind in a final HEP to maintain or progress achieved funtional mobility    Status New    Target Date 10/07/20      PT LONG TERM GOAL #2   Title Increase bilat hip strength to 4/5 or greater, and bilat knee and ankle strength to 4+/5 or greater for improved functional mobility and safety    Status New    Target Date 10/07/20      PT LONG TERM GOAL #3   Title Improved FOTO score to 61% functional ability    Baseline 51%    Status New    Target Date 10/07/20      PT LONG TERM GOAL #4   Title Pt will report 50% improvement in her low back pain associated with activity on her feet or the ability to be  active on her feet longer.    Baseline 6/10    Status New    Target Date 10/07/20      PT LONG TERM GOAL #5   Title Pt will demonstrate proper body mechanics for household activites to help minimize low back aggrevation with activity    Status New    Target Date 10/07/20                 Plan - 09/01/20 1408    Clinical Impression Statement Pt's report indicates she is tolerating her household activities with less back pain. Pt continued lumbopelvic strengthening with the addition of mid to upper exs. Pt indicated consistent compliance with her HEP. Pt tolerated todya's session without adverse effects.    Personal Factors and Comorbidities Comorbidity 3+;Past/Current Experience;Time since onset of injury/illness/exacerbation    Comorbidities OA, osteopenia, ortthostatic hypotension, R femur, R INTRAMEDULLARY (IM) NAIL INTERTROCHANTERIC 2017 after fall in yard    Examination-Activity Limitations Bend;Squat;Stand;Stairs;Locomotion Level;Lift    Examination-Participation Restrictions Cleaning;Laundry    Stability/Clinical Decision Making Stable/Uncomplicated    Clinical Decision Making Moderate    Rehab Potential Good    PT Frequency 2x / week    PT Duration 6 weeks    PT Treatment/Interventions ADLs/Self Care Home Management;Cryotherapy;Electrical Stimulation;Ultrasound;Moist Heat;Iontophoresis 4mg /ml Dexamethasone;Functional mobility training;Therapeutic activities;Therapeutic exercise;Balance training;Manual techniques;Patient/family education;Passive range of motion;Dry needling;Taping    PT Next Visit Plan Progress core/hip strengthening as tolerated including function based strengthening exs.    PT Home Exercise Plan [id number]    Consulted and Agree with Plan of Care Patient           Patient will benefit from skilled therapeutic intervention in order to improve the following deficits and impairments:  Difficulty walking,Pain,Decreased activity tolerance,Decreased  strength,Postural dysfunction,Decreased balance  Visit Diagnosis: Lumbar spine pain  Muscle weakness (generalized)  Difficulty in walking, not elsewhere classified     Problem List Patient Active Problem List   Diagnosis Date Noted  .  Low back pain 07/20/2020  . Constipation 04/29/2020  . Anxiety 01/03/2020  . Rash 07/29/2019  . Nausea 02/12/2019  . Aortic regurgitation 06/16/2018  . Fall 06/12/2016  . Closed right hip fracture ([place]) 06/12/2016  . Cerumen impaction 04/06/2016  . Fatigue 02/15/2016  . DOE (dyspnea on exertion) 02/15/2016  . Headache 09/27/2015  . Coronary atherosclerosis 06/29/2015  . Urgency of urination 05/17/2015  . Abdominal pain 03/30/2015  . Paresthesias 09/21/2014  . Breast pain, left 09/21/2014  . Chest pressure 07/22/2012  . Lightheadedness 07/17/2012  . Orthostatic hypotension 07/17/2012  . Hot flash, menopausal 02/15/2012  . Allergic rhinitis 09/12/2011  . Well adult exam 02/14/2011  . HEMORRHOIDS, WITH BLEEDING 10/05/2009  . Vitamin D deficiency 06/17/2007  . Asthma 06/17/2007  . GERD (gastroesophageal reflux disease) 06/17/2007  . OSTEOARTHRITIS 06/17/2007  . Osteoporosis 06/17/2007  . COLONIC POLYPS, HX OF 06/17/2007    Gar Ponto MS, PT 09/01/20 5:18 PM  [facility] [address] [place], [place], [address] Phone: [phone]   Fax:  [phone]  Name: Cindy Meza MRN: [id number] Date of Birth: Jun 28, 1936

## 2020-09-07 ENCOUNTER — Other Ambulatory Visit: Payer: Self-pay

## 2020-09-07 ENCOUNTER — Ambulatory Visit: Payer: Medicare Other | Attending: Family Medicine

## 2020-09-07 DIAGNOSIS — M545 Low back pain, unspecified: Secondary | ICD-10-CM | POA: Diagnosis present

## 2020-09-07 DIAGNOSIS — M6281 Muscle weakness (generalized): Secondary | ICD-10-CM | POA: Insufficient documentation

## 2020-09-07 DIAGNOSIS — R262 Difficulty in walking, not elsewhere classified: Secondary | ICD-10-CM | POA: Insufficient documentation

## 2020-09-07 NOTE — Therapy (Signed)
[place], [place], [address] Phone: [phone]   Fax:  [phone]  Physical Therapy Treatment  Patient Details  Name: Cindy Meza MRN: [id number] Date of Birth: 11-18-1935 Referring Provider (PT): Lyndal Pulley, DO   Encounter Date: 09/07/2020   PT End of Session - 09/07/20 1416    Visit Number 6    Number of Visits 13    Date for PT Re-Evaluation 10/07/20    Authorization Type UHC MEDICARE    PT Start Time 1400    PT Stop Time 6047    PT Time Calculation (min) 45 min    Activity Tolerance Patient tolerated treatment well    Behavior During Therapy [facility] for tasks assessed/performed           Past Medical History:  Diagnosis Date  . Allergy   . Anemia   . Asthma    Dr. Melvyn Novas  . Blood transfusion without reported diagnosis   . Breast cyst    Left  . Cataract    Dr. Gershon Crane  . Complication of anesthesia    ' I HAD HALLUCINATIONS WHEN THEY DID MY HIP "  . GERD (gastroesophageal reflux disease)   . Heart murmur   . Hemorrhoids   . History of colonic polyps [name]   hx of adenomas (small) 1998  . OA (osteoarthritis)   . Orthostatic hypotension   . Osteopenia   . PAC (premature atrial contraction)   . Vitamin D deficiency     Past Surgical History:  Procedure Laterality Date  . ABDOMINAL HYSTERECTOMY     complete  . APPENDECTOMY    . BREAST CYST EXCISION    . CATARACT EXTRACTION, BILATERAL     04/23/18 left eye, 04/02/18 right eye  . COLONOSCOPY  Multiple   Adenomatous colon polyps  . ESOPHAGOGASTRODUODENOSCOPY  2010  . INTRAMEDULLARY (IM) NAIL INTERTROCHANTERIC Right 06/13/2016   Procedure: INTRAMEDULLARY (IM) NAIL INTERTROCHANTRIC;  Surgeon: Tania Ade, MD;  Location: [place];  Service: Orthopedics;  Laterality: Right;  . TONSILLECTOMY      There were no vitals filed for this visit.   Subjective Assessment - 09/07/20 1411    Subjective Pt reports her back bothered  her 1 day when she was very active. Also, her l knee bothered her for about a day after ther last session.    Diagnostic tests 07/20/20 Xray:    FINDINGS:  Five lumbar type vertebra. There is no acute fracture or subluxation  of the lumbar spine. Mild osteopenia and degenerative changes. The  visualized posterior elements are intact. The soft tissues are  unremarkable.     IMPRESSION:  No acute/traumatic lumbar spine pathology.    Patient Stated Goals To tolerte activity with less back pain.    Currently in Pain? No/denies    Pain Score 0-No pain    Pain Location Back    Pain Orientation Posterior;Lateral    Pain Onset More than a month ago                             [facility] Adult PT Treatment/Exercise - 09/07/20 0001      Exercises   Exercises Lumbar;Knee/Hip      Lumbar Exercises: Aerobic   Nustep 5 mins; L5; UEs/LEs      Lumbar Exercises: Supine   Pelvic Tilt Limitations 12x    Clam Limitations 12x    Bent Knee Raise 15 reps  with core engagaed   Bridge Limitations 12x    Other Supine Lumbar Exercises hip adduction isometric 12x, 3 sec    Other Supine Lumbar Exercises Lat. dorsi pullover 12x; 4 lbs. Shoulder horizonal abd, then shoulder ER, both 12x c yellow tband      Knee/Hip Exercises: Supine   Short Arc Quad Sets Right;Left   12 reps   Straight Leg Raises Right;Left   12 reps                   PT Short Term Goals - 09/01/20 1428      PT SHORT TERM GOAL #1   Title Pt will be Ind in an initial HEP    Baseline started on eval    Status Achieved    Target Date 09/01/20             PT Long Term Goals - 09/07/20 1511      PT LONG TERM GOAL #3   Title Improved FOTO score to 61% functional ability. met at 63%    Status Achieved    Target Date 09/07/20                 Plan - 09/07/20 1502    Clinical Impression Statement FOTO was reassessed with pt's score improving from 51% to 63% met this LTG. PT was completed for  lumbopelvic/LE strenghtneing with the overall demand for this session decreased with pt experiencing R knee and thigh discomfort after the last session. Pt will benefit from continued PT from strengthening and back care to optimize her functional mobility.    Personal Factors and Comorbidities Comorbidity 3+;Past/Current Experience;Time since onset of injury/illness/exacerbation    Comorbidities OA, osteopenia, ortthostatic hypotension, R femur, R INTRAMEDULLARY (IM) NAIL INTERTROCHANTERIC 2017 after fall in yard    Examination-Activity Limitations Bend;Squat;Stand;Stairs;Locomotion Level;Lift    Examination-Participation Restrictions Cleaning;Laundry    Stability/Clinical Decision Making Stable/Uncomplicated    Clinical Decision Making Moderate    Rehab Potential Good    PT Frequency 2x / week    PT Duration 6 weeks    PT Treatment/Interventions ADLs/Self Care Home Management;Cryotherapy;Electrical Stimulation;Ultrasound;Moist Heat;Iontophoresis 72m/ml Dexamethasone;Functional mobility training;Therapeutic activities;Therapeutic exercise;Balance training;Manual techniques;Patient/family education;Passive range of motion;Dry needling;Taping    PT Next Visit Plan Progress core/hip strengthening as tolerated including function based strengthening exs as R knee tolerates.Instruct in proper body mechanics for hosehold activities.    PT Home Exercise Plan [email or web address]   Consulted and Agree with Plan of Care Patient           Patient will benefit from skilled therapeutic intervention in order to improve the following deficits and impairments:  Difficulty walking,Pain,Decreased activity tolerance,Decreased strength,Postural dysfunction,Decreased balance  Visit Diagnosis: Lumbar spine pain  Muscle weakness (generalized)  Difficulty in walking, not elsewhere classified     Problem List Patient Active Problem List   Diagnosis Date Noted  . Low back pain 07/20/2020  . Constipation 04/29/2020  .  Anxiety 01/03/2020  . Rash 07/29/2019  . Nausea 02/12/2019  . Aortic regurgitation 06/16/2018  . Fall 06/12/2016  . Closed right hip fracture ([place] 06/12/2016  . Cerumen impaction 04/06/2016  . Fatigue 02/15/2016  . DOE (dyspnea on exertion) 02/15/2016  . Headache 09/27/2015  . Coronary atherosclerosis 06/29/2015  . Urgency of urination 05/17/2015  . Abdominal pain 03/30/2015  . Paresthesias 09/21/2014  . Breast pain, left 09/21/2014  . Chest pressure 07/22/2012  . Lightheadedness 07/17/2012  . Orthostatic hypotension 07/17/2012  . Hot flash, menopausal 02/15/2012  .  Allergic rhinitis 09/12/2011  . Well adult exam 02/14/2011  . HEMORRHOIDS, WITH BLEEDING 10/05/2009  . Vitamin D deficiency 06/17/2007  . Asthma 06/17/2007  . GERD (gastroesophageal reflux disease) 06/17/2007  . OSTEOARTHRITIS 06/17/2007  . Osteoporosis 06/17/2007  . COLONIC POLYPS, HX OF 06/17/2007    Gar Ponto MS, PT 09/07/20 3:16 PM  [facility] [address] [place], [place], [address] Phone: [phone]   Fax:  [phone]  Name: Cindy Meza MRN: [id number] Date of Birth: 02-Oct-1935

## 2020-09-08 ENCOUNTER — Ambulatory Visit: Payer: Medicare Other | Admitting: Family Medicine

## 2020-09-08 ENCOUNTER — Other Ambulatory Visit: Payer: Self-pay | Admitting: Internal Medicine

## 2020-09-13 ENCOUNTER — Other Ambulatory Visit: Payer: Self-pay

## 2020-09-13 ENCOUNTER — Ambulatory Visit: Payer: Medicare Other

## 2020-09-13 DIAGNOSIS — M545 Low back pain, unspecified: Secondary | ICD-10-CM | POA: Diagnosis not present

## 2020-09-13 DIAGNOSIS — M6281 Muscle weakness (generalized): Secondary | ICD-10-CM

## 2020-09-13 DIAGNOSIS — R262 Difficulty in walking, not elsewhere classified: Secondary | ICD-10-CM

## 2020-09-13 NOTE — Therapy (Signed)
[place], [place], [address] Phone: [phone]   Fax:  [phone]  Physical Therapy Treatment  Patient Details  Name: Cindy Meza MRN: [id number] Date of Birth: 02-25-1936 Referring Provider (PT): Lyndal Pulley, DO   Encounter Date: 09/13/2020   PT End of Session - 09/13/20 1447    Visit Number 7    Number of Visits 13    Date for PT Re-Evaluation 10/07/20    Authorization Type UHC MEDICARE    PT Start Time 6962    PT Stop Time 1535    PT Time Calculation (min) 50 min    Activity Tolerance Patient tolerated treatment well    Behavior During Therapy [facility] for tasks assessed/performed           Past Medical History:  Diagnosis Date  . Allergy   . Anemia   . Asthma    Dr. Melvyn Novas  . Blood transfusion without reported diagnosis   . Breast cyst    Left  . Cataract    Dr. Gershon Crane  . Complication of anesthesia    ' I HAD HALLUCINATIONS WHEN THEY DID MY HIP "  . GERD (gastroesophageal reflux disease)   . Heart murmur   . Hemorrhoids   . History of colonic polyps [name]   hx of adenomas (small) 1998  . OA (osteoarthritis)   . Orthostatic hypotension   . Osteopenia   . PAC (premature atrial contraction)   . Vitamin D deficiency     Past Surgical History:  Procedure Laterality Date  . ABDOMINAL HYSTERECTOMY     complete  . APPENDECTOMY    . BREAST CYST EXCISION    . CATARACT EXTRACTION, BILATERAL     04/23/18 left eye, 04/02/18 right eye  . COLONOSCOPY  Multiple   Adenomatous colon polyps  . ESOPHAGOGASTRODUODENOSCOPY  2010  . INTRAMEDULLARY (IM) NAIL INTERTROCHANTERIC Right 06/13/2016   Procedure: INTRAMEDULLARY (IM) NAIL INTERTROCHANTRIC;  Surgeon: Tania Ade, MD;  Location: [place];  Service: Orthopedics;  Laterality: Right;  . TONSILLECTOMY      There were no vitals filed for this visit.   Subjective Assessment - 09/13/20 1456    Subjective Pt reports her l knee  bothered her after the last PT session, but reports this has resolved    Pertinent History OA, osteopenia, ortthostatic hypotension,  INTRAMEDULLARY (IM) NAIL INTERTROCHANTERIC 2017 after fall in yard    Diagnostic tests 07/20/20 Xray:    FINDINGS:  Five lumbar type vertebra. There is no acute fracture or subluxation  of the lumbar spine. Mild osteopenia and degenerative changes. The  visualized posterior elements are intact. The soft tissues are  unremarkable.     IMPRESSION:  No acute/traumatic lumbar spine pathology.    Patient Stated Goals To tolerte activity with less back pain.    Currently in Pain? No/denies    Pain Score 0-No pain    Pain Location Back    Pain Orientation Posterior    Pain Descriptors / Indicators Aching    Pain Type Chronic pain    Pain Onset More than a month ago    Aggravating Factors  prolonged walking or standing activities    Pain Relieving Factors sitting or lying down    Effect of Pain on Daily Activities i'm able to keep going                             [facility]  Adult PT Treatment/Exercise - 09/13/20 0001      Self-Care   Self-Care Other Self-Care Comments    Other Self-Care Comments  Body mechanics for home activities : washing dishes, overhead lifting to shelves, unloading a washing machine and dryer      Exercises   Exercises Lumbar;Knee/Hip      Lumbar Exercises: Aerobic   Nustep 5 mins; L5; UEs/LEs      Lumbar Exercises: Standing   Row Both;10 reps   2 sets   Theraband Level (Row) Level 2 (Red)    Shoulder Extension Both;10 reps   2 sets   Theraband Level (Shoulder Extension) Level 2 (Red)    Other Standing Lumbar Exercises Attempted proper lifting of box c hi/knee flex and hinged hip. Pt was not able to tolerate R knee flexion, and the lift was stopped    Other Standing Lumbar Exercises Overhead lifting to 1st and 2nd shelves, 10x each, 1lb with proper tech.      Lumbar Exercises: Supine   Straight Leg Raise 10 reps;2  seconds   2 sets     Knee/Hip Exercises: Supine   Straight Leg Raises Right;Left   12 reps                 PT Education - 09/13/20 1536    Education Details Body mechanics for home activities : washing dishes, overhead lifting to shelves, unloading a washing machine and dryer    Person(s) Educated Patient    Methods Explanation;Demonstration;Tactile cues;Verbal cues    Comprehension Verbalized understanding;Returned demonstration;Verbal cues required;Tactile cues required;Need further instruction            PT Short Term Goals - 09/01/20 1428      PT SHORT TERM GOAL #1   Title Pt will be Ind in an initial HEP    Baseline started on eval    Status Achieved    Target Date 09/01/20             PT Long Term Goals - 09/07/20 1511      PT LONG TERM GOAL #3   Title Improved FOTO score to 61% functional ability. met at 63%    Status Achieved    Target Date 09/07/20                 Plan - 09/13/20 1711    Clinical Impression Statement PT focused on proper body mechanics for household activities as well strenghtening of the LEs. Bending of knees with lifting objects from the floor or a lower level is limited due R knee discomfort/weakness (old injury, ORIF R femur). Pt returned demonstration for instruction of body mechanics for home activities: washing dishes, overhead lifting to shelves, unloading a washing machine and dryer. Pt uses a reacher for removing clothes from her washing machine.    Personal Factors and Comorbidities Comorbidity 3+;Past/Current Experience;Time since onset of injury/illness/exacerbation    Comorbidities OA, osteopenia, ortthostatic hypotension, R femur, R INTRAMEDULLARY (IM) NAIL INTERTROCHANTERIC 2017 after fall in yard    Examination-Activity Limitations Bend;Squat;Stand;Stairs;Locomotion Level;Lift    Examination-Participation Restrictions Cleaning;Laundry    Stability/Clinical Decision Making Stable/Uncomplicated    Clinical Decision  Making Moderate    Rehab Potential Good    PT Frequency 2x / week    PT Duration 6 weeks    PT Treatment/Interventions ADLs/Self Care Home Management;Cryotherapy;Electrical Stimulation;Ultrasound;Moist Heat;Iontophoresis 66m/ml Dexamethasone;Functional mobility training;Therapeutic activities;Therapeutic exercise;Balance training;Manual techniques;Patient/family education;Passive range of motion;Dry needling;Taping    PT Next Visit Plan Continuing body mechanics for  household activities. Progress core/hip strengthening as tolerated including function based strengthening exs as R knee tolerates.    PT Home Exercise Plan [email or web address]    Consulted and Agree with Plan of Care Patient           Patient will benefit from skilled therapeutic intervention in order to improve the following deficits and impairments:  Difficulty walking,Pain,Decreased activity tolerance,Decreased strength,Postural dysfunction,Decreased balance  Visit Diagnosis: Lumbar spine pain  Muscle weakness (generalized)  Difficulty in walking, not elsewhere classified     Problem List Patient Active Problem List   Diagnosis Date Noted  . Low back pain 07/20/2020  . Constipation 04/29/2020  . Anxiety 01/03/2020  . Rash 07/29/2019  . Nausea 02/12/2019  . Aortic regurgitation 06/16/2018  . Fall 06/12/2016  . Closed right hip fracture ([place]) 06/12/2016  . Cerumen impaction 04/06/2016  . Fatigue 02/15/2016  . DOE (dyspnea on exertion) 02/15/2016  . Headache 09/27/2015  . Coronary atherosclerosis 06/29/2015  . Urgency of urination 05/17/2015  . Abdominal pain 03/30/2015  . Paresthesias 09/21/2014  . Breast pain, left 09/21/2014  . Chest pressure 07/22/2012  . Lightheadedness 07/17/2012  . Orthostatic hypotension 07/17/2012  . Hot flash, menopausal 02/15/2012  . Allergic rhinitis 09/12/2011  . Well adult exam 02/14/2011  . HEMORRHOIDS, WITH BLEEDING 10/05/2009  . Vitamin D deficiency 06/17/2007  . Asthma  06/17/2007  . GERD (gastroesophageal reflux disease) 06/17/2007  . OSTEOARTHRITIS 06/17/2007  . Osteoporosis 06/17/2007  . COLONIC POLYPS, HX OF 06/17/2007   Gar Ponto MS, PT 09/13/20 5:44 PM  [facility] [address] [place], [place], [address] Phone: [phone]   Fax:  [phone]  Name: Cindy Meza MRN: [id number] Date of Birth: 1935/12/13

## 2020-09-15 ENCOUNTER — Ambulatory Visit: Payer: Medicare Other

## 2020-09-15 ENCOUNTER — Other Ambulatory Visit: Payer: Self-pay

## 2020-09-15 DIAGNOSIS — M545 Low back pain, unspecified: Secondary | ICD-10-CM | POA: Diagnosis not present

## 2020-09-15 DIAGNOSIS — R262 Difficulty in walking, not elsewhere classified: Secondary | ICD-10-CM

## 2020-09-15 DIAGNOSIS — M6281 Muscle weakness (generalized): Secondary | ICD-10-CM

## 2020-09-15 NOTE — Patient Instructions (Signed)

## 2020-09-15 NOTE — Therapy (Signed)
[place], [place], [address] Phone: [phone]   Fax:  [phone]  Physical Therapy Treatment  Patient Details  Name: Cindy Meza MRN: [id number] Date of Birth: 10/05/1935 Referring Provider (PT): Lyndal Pulley, DO   Encounter Date: 09/15/2020   PT End of Session - 09/15/20 1327    Visit Number 8    Number of Visits 13    Date for PT Re-Evaluation 10/07/20    Authorization Type UHC MEDICARE    PT Start Time 7253    PT Stop Time 1414    PT Time Calculation (min) 46 min    Activity Tolerance Patient tolerated treatment well    Behavior During Therapy [facility] for tasks assessed/performed           Past Medical History:  Diagnosis Date  . Allergy   . Anemia   . Asthma    Dr. Melvyn Novas  . Blood transfusion without reported diagnosis   . Breast cyst    Left  . Cataract    Dr. Gershon Crane  . Complication of anesthesia    ' I HAD HALLUCINATIONS WHEN THEY DID MY HIP "  . GERD (gastroesophageal reflux disease)   . Heart murmur   . Hemorrhoids   . History of colonic polyps [name]   hx of adenomas (small) 1998  . OA (osteoarthritis)   . Orthostatic hypotension   . Osteopenia   . PAC (premature atrial contraction)   . Vitamin D deficiency     Past Surgical History:  Procedure Laterality Date  . ABDOMINAL HYSTERECTOMY     complete  . APPENDECTOMY    . BREAST CYST EXCISION    . CATARACT EXTRACTION, BILATERAL     04/23/18 left eye, 04/02/18 right eye  . COLONOSCOPY  Multiple   Adenomatous colon polyps  . ESOPHAGOGASTRODUODENOSCOPY  2010  . INTRAMEDULLARY (IM) NAIL INTERTROCHANTERIC Right 06/13/2016   Procedure: INTRAMEDULLARY (IM) NAIL INTERTROCHANTRIC;  Surgeon: Tania Ade, MD;  Location: [place];  Service: Orthopedics;  Laterality: Right;  . TONSILLECTOMY      There were no vitals filed for this visit.   Subjective Assessment - 09/15/20 1332    Subjective patient reports no back  pain currently, but continues to have pain when she stands for awile. She reports she was worn out after last session.    Pertinent History OA, osteopenia, ortthostatic hypotension,  INTRAMEDULLARY (IM) NAIL INTERTROCHANTERIC 2017 after fall in yard    Diagnostic tests 07/20/20 Xray:    FINDINGS:  Five lumbar type vertebra. There is no acute fracture or subluxation  of the lumbar spine. Mild osteopenia and degenerative changes. The  visualized posterior elements are intact. The soft tissues are  unremarkable.     IMPRESSION:  No acute/traumatic lumbar spine pathology.    Patient Stated Goals To tolerte activity with less back pain.    Currently in Pain? No/denies    Pain Onset More than a month ago                             [facility] Adult PT Treatment/Exercise - 09/15/20 0001      Self-Care   Other Self-Care Comments  see patient education      Therapeutic Activites    Therapeutic Activities Lifting    Lifting lifting of stool 2 x 5; cues for proper muscle sequence, foot placement      Lumbar Exercises: Aerobic  Nustep 5 mins; L5; UEs/LEs      Lumbar Exercises: Standing   Other Standing Lumbar Exercises hip hinge with dowel 2 x 5      Lumbar Exercises: Seated   Other Seated Lumbar Exercises hip hinge 1 x 10      Lumbar Exercises: Supine   Bridge 10 reps    Bridge Limitations x2; with abduction red theraband      Knee/Hip Exercises: Seated   Sit to Sand 10 reps   overhead reach with ball     Knee/Hip Exercises: Sidelying   Hip ABduction 10 reps    Hip ABduction Limitations x 2    Clams 2 x 15 red theraband                  PT Education - 09/15/20 1355    Education Details Body mechanics for lifting/bending activities. posture    Person(s) Educated Patient    Methods Explanation;Demonstration;Handout;Verbal cues;Tactile cues    Comprehension Verbalized understanding;Returned demonstration;Verbal cues required;Tactile cues required;Need further  instruction            PT Short Term Goals - 09/01/20 1428      PT SHORT TERM GOAL #1   Title Pt will be Ind in an initial HEP    Baseline started on eval    Status Achieved    Target Date 09/01/20             PT Long Term Goals - 09/07/20 1511      PT LONG TERM GOAL #3   Title Improved FOTO score to 61% functional ability. met at 63%    Status Achieved    Target Date 09/07/20                 Plan - 09/15/20 1328    Clinical Impression Statement Session focused on training in proper hip hinge and squatting mechanics as she initially demonstrates excessive trunk flexion when asked to pick up object from floor. With continued practice patient able to demonstrate appropriate hip hinge and squatting mechanics through partial range of motion, though has difficulty completing deep squat due to Rt knee pain (history of intramedullary nail intertrochanteric). Patient denied back pain with these functional activities. Able to progress hip strengthening with patient notably challenged with hip abductor strengthening on the RLE. Patient expressed interest at end of session in learning how to complete floor transfer and due to limited time remaining in session told her we would plan to address this skill at next visit.    Personal Factors and Comorbidities Comorbidity 3+;Past/Current Experience;Time since onset of injury/illness/exacerbation    Comorbidities OA, osteopenia, ortthostatic hypotension, R femur, R INTRAMEDULLARY (IM) NAIL INTERTROCHANTERIC 2017 after fall in yard    Examination-Activity Limitations Bend;Squat;Stand;Stairs;Locomotion Level;Lift    Examination-Participation Restrictions Cleaning;Laundry    Stability/Clinical Decision Making Stable/Uncomplicated    Rehab Potential Good    PT Frequency 2x / week    PT Duration 6 weeks    PT Treatment/Interventions ADLs/Self Care Home Management;Cryotherapy;Electrical Stimulation;Ultrasound;Moist Heat;Iontophoresis 4m/ml  Dexamethasone;Functional mobility training;Therapeutic activities;Therapeutic exercise;Balance training;Manual techniques;Patient/family education;Passive range of motion;Dry needling;Taping    PT Next Visit Plan Floor transfer. Continuing body mechanics for household activities. Progress core/hip strengthening as tolerated including function based strengthening exs as R knee tolerates.    PT [place] Agree with Plan of Care Patient           Patient will benefit from skilled therapeutic intervention in order  to improve the following deficits and impairments:  Difficulty walking,Pain,Decreased activity tolerance,Decreased strength,Postural dysfunction,Decreased balance  Visit Diagnosis: Lumbar spine pain  Muscle weakness (generalized)  Difficulty in walking, not elsewhere classified     Problem List Patient Active Problem List   Diagnosis Date Noted  . Low back pain 07/20/2020  . Constipation 04/29/2020  . Anxiety 01/03/2020  . Rash 07/29/2019  . Nausea 02/12/2019  . Aortic regurgitation 06/16/2018  . Fall 06/12/2016  . Closed right hip fracture ([place]) 06/12/2016  . Cerumen impaction 04/06/2016  . Fatigue 02/15/2016  . DOE (dyspnea on exertion) 02/15/2016  . Headache 09/27/2015  . Coronary atherosclerosis 06/29/2015  . Urgency of urination 05/17/2015  . Abdominal pain 03/30/2015  . Paresthesias 09/21/2014  . Breast pain, left 09/21/2014  . Chest pressure 07/22/2012  . Lightheadedness 07/17/2012  . Orthostatic hypotension 07/17/2012  . Hot flash, menopausal 02/15/2012  . Allergic rhinitis 09/12/2011  . Well adult exam 02/14/2011  . HEMORRHOIDS, WITH BLEEDING 10/05/2009  . Vitamin D deficiency 06/17/2007  . Asthma 06/17/2007  . GERD (gastroesophageal reflux disease) 06/17/2007  . OSTEOARTHRITIS 06/17/2007  . Osteoporosis 06/17/2007  . COLONIC POLYPS, HX OF 06/17/2007   Gwendolyn Grant, PT, DPT, ATC 09/15/20 2:29 PM  [facility] [address] [place], [place], [address] Phone: [phone]   Fax:  [phone]  Name: Ellenor Wisniewski MRN: [id number] Date of Birth: Sep 24, 1935

## 2020-09-19 ENCOUNTER — Telehealth: Payer: Self-pay | Admitting: Internal Medicine

## 2020-09-19 NOTE — Telephone Encounter (Signed)
  STAT if patient feels like he/she is going to faint   1) Are you dizzy now? no  2) Do you feel faint or have you passed out? no  3) Do you have any other symptoms? Lightheadedness, nausea, weak  4) Have you checked your HR and BP (record if available)? 112/76 HR 88 today  Patient has been having symptoms for several days. She wonders if it is related to her medicaitons. She states symptoms are sporadic but she notices it more after she takes her first dose of medications in the am.

## 2020-09-20 ENCOUNTER — Other Ambulatory Visit: Payer: Self-pay

## 2020-09-20 ENCOUNTER — Ambulatory Visit: Payer: Medicare Other

## 2020-09-20 DIAGNOSIS — R262 Difficulty in walking, not elsewhere classified: Secondary | ICD-10-CM

## 2020-09-20 DIAGNOSIS — M545 Low back pain, unspecified: Secondary | ICD-10-CM

## 2020-09-20 DIAGNOSIS — M6281 Muscle weakness (generalized): Secondary | ICD-10-CM

## 2020-09-20 NOTE — Therapy (Signed)
[place], [place], [address] Phone: [phone]   Fax:  [phone]  Physical Therapy Treatment  Patient Details  Name: Cindy Meza MRN: [id number] Date of Birth: 1936-04-02 Referring Provider (PT): Lyndal Pulley, DO   Encounter Date: 09/20/2020   PT End of Session - 09/20/20 1552    Visit Number 9    Number of Visits 13    Date for PT Re-Evaluation 10/07/20    Authorization Type UHC MEDICARE    PT Start Time 4315    PT Stop Time 1627    PT Time Calculation (min) 40 min    Activity Tolerance Patient tolerated treatment well    Behavior During Therapy [facility] for tasks assessed/performed           Past Medical History:  Diagnosis Date  . Allergy   . Anemia   . Asthma    Dr. Melvyn Novas  . Blood transfusion without reported diagnosis   . Breast cyst    Left  . Cataract    Dr. Gershon Crane  . Complication of anesthesia    ' I HAD HALLUCINATIONS WHEN THEY DID MY HIP "  . GERD (gastroesophageal reflux disease)   . Heart murmur   . Hemorrhoids   . History of colonic polyps [name]   hx of adenomas (small) 1998  . OA (osteoarthritis)   . Orthostatic hypotension   . Osteopenia   . PAC (premature atrial contraction)   . Vitamin D deficiency     Past Surgical History:  Procedure Laterality Date  . ABDOMINAL HYSTERECTOMY     complete  . APPENDECTOMY    . BREAST CYST EXCISION    . CATARACT EXTRACTION, BILATERAL     04/23/18 left eye, 04/02/18 right eye  . COLONOSCOPY  Multiple   Adenomatous colon polyps  . ESOPHAGOGASTRODUODENOSCOPY  2010  . INTRAMEDULLARY (IM) NAIL INTERTROCHANTERIC Right 06/13/2016   Procedure: INTRAMEDULLARY (IM) NAIL INTERTROCHANTRIC;  Surgeon: Tania Ade, MD;  Location: [place];  Service: Orthopedics;  Laterality: Right;  . TONSILLECTOMY      There were no vitals filed for this visit.   Subjective Assessment - 09/20/20 1624    Subjective Pt reports she is not  experiencing back pain today.    Pertinent History OA, osteopenia, ortthostatic hypotension,  INTRAMEDULLARY (IM) NAIL INTERTROCHANTERIC 2017 after fall in yard    Limitations Standing;Walking    Diagnostic tests 07/20/20 Xray:    FINDINGS:  Five lumbar type vertebra. There is no acute fracture or subluxation  of the lumbar spine. Mild osteopenia and degenerative changes. The  visualized posterior elements are intact. The soft tissues are  unremarkable.     IMPRESSION:  No acute/traumatic lumbar spine pathology.    Patient Stated Goals To tolerte activity with less back pain.    Currently in Pain? No/denies    Pain Score 0-No pain    Pain Location Back    Pain Orientation Posterior    Pain Descriptors / Indicators Aching    Pain Type Chronic pain    Pain Radiating Towards NA    Pain Onset More than a month ago                             [facility] Adult PT Treatment/Exercise - 09/20/20 0001      Exercises   Exercises Lumbar;Knee/Hip      Lumbar Exercises: Aerobic   Nustep 5 mins; L5; UEs/LEs  Lumbar Exercises: Standing   Other Standing Lumbar Exercises Shoulder elevation to 90d, Bilat, 1#      Lumbar Exercises: Supine   Pelvic Tilt Limitations 12x   3 sec   Clam Limitations 15x   2 sets   Bent Knee Raise 10 reps   with core engagaed, 2x   Bridge 10 reps   2 sets   Straight Leg Raise 10 reps;2 seconds   2 sets   Other Supine Lumbar Exercises hip adduction isometric 15x, 3 sec      Knee/Hip Exercises: Standing   Heel Raises Both;15 reps    Heel Raises Limitations Toe lifts    Hip Abduction Right;Left;2 sets;10 reps    Hip Extension Right;Left;10 reps                    PT Short Term Goals - 09/01/20 1428      PT SHORT TERM GOAL #1   Title Pt will be Ind in an initial HEP    Baseline started on eval    Status Achieved    Target Date 09/01/20             PT Long Term Goals - 09/07/20 1511      PT LONG TERM GOAL #3   Title Improved FOTO  score to 61% functional ability. met at 63%    Status Achieved    Target Date 09/07/20                 Plan - 09/20/20 1638    Clinical Impression Statement Pt was continued for back, core, UB, and LB strengthening to assist with tolerance of home activities c deceased low back pain. PT provided strengthening trying manage muscle soreness per pt request. Pt tolerated the sesson well without concern for over exercising.    Personal Factors and Comorbidities Comorbidity 3+;Past/Current Experience;Time since onset of injury/illness/exacerbation    Comorbidities OA, osteopenia, ortthostatic hypotension, R femur, R INTRAMEDULLARY (IM) NAIL INTERTROCHANTERIC 2017 after fall in yard    Examination-Activity Limitations Bend;Squat;Stand;Stairs;Locomotion Level;Lift    Examination-Participation Restrictions Cleaning;Laundry    Stability/Clinical Decision Making Stable/Uncomplicated    Clinical Decision Making Moderate    Rehab Potential Good    PT Frequency 2x / week    PT Duration 6 weeks    PT Treatment/Interventions ADLs/Self Care Home Management;Cryotherapy;Electrical Stimulation;Ultrasound;Moist Heat;Iontophoresis 20m/ml Dexamethasone;Functional mobility training;Therapeutic activities;Therapeutic exercise;Balance training;Manual techniques;Patient/family education;Passive range of motion;Dry needling;Taping    PT Next Visit Plan Floor transfer. Continuing body mechanics for household activities. Progress core/hip strengthening as tolerated including function based strengthening exs as R knee tolerates.    PT Home Exercise Plan [id number]   Consulted and Agree with Plan of Care Patient           Patient will benefit from skilled therapeutic intervention in order to improve the following deficits and impairments:  Difficulty walking,Pain,Decreased activity tolerance,Decreased strength,Postural dysfunction,Decreased balance  Visit Diagnosis: Lumbar spine pain  Muscle weakness  (generalized)  Difficulty in walking, not elsewhere classified     Problem List Patient Active Problem List   Diagnosis Date Noted  . Low back pain 07/20/2020  . Constipation 04/29/2020  . Anxiety 01/03/2020  . Rash 07/29/2019  . Nausea 02/12/2019  . Aortic regurgitation 06/16/2018  . Fall 06/12/2016  . Closed right hip fracture ([place] 06/12/2016  . Cerumen impaction 04/06/2016  . Fatigue 02/15/2016  . DOE (dyspnea on exertion) 02/15/2016  . Headache 09/27/2015  . Coronary atherosclerosis 06/29/2015  . Urgency  of urination 05/17/2015  . Abdominal pain 03/30/2015  . Paresthesias 09/21/2014  . Breast pain, left 09/21/2014  . Chest pressure 07/22/2012  . Lightheadedness 07/17/2012  . Orthostatic hypotension 07/17/2012  . Hot flash, menopausal 02/15/2012  . Allergic rhinitis 09/12/2011  . Well adult exam 02/14/2011  . HEMORRHOIDS, WITH BLEEDING 10/05/2009  . Vitamin D deficiency 06/17/2007  . Asthma 06/17/2007  . GERD (gastroesophageal reflux disease) 06/17/2007  . OSTEOARTHRITIS 06/17/2007  . Osteoporosis 06/17/2007  . COLONIC POLYPS, HX OF 06/17/2007    Gar Ponto MS, PT 09/20/20 4:59 PM  [facility] [address] [place], [place], [address] Phone: [phone]   Fax:  [phone]  Name: Cindy Meza MRN: [id number] Date of Birth: 24-Jul-1935

## 2020-09-21 NOTE — Telephone Encounter (Signed)
Stop Crestor for now INcrease Florinef to 0.05 AM and 2 PM   SPoke to patient    [facility] will call next week.

## 2020-09-22 ENCOUNTER — Other Ambulatory Visit: Payer: Self-pay

## 2020-09-22 ENCOUNTER — Ambulatory Visit: Payer: Medicare Other

## 2020-09-22 DIAGNOSIS — M545 Low back pain, unspecified: Secondary | ICD-10-CM | POA: Diagnosis not present

## 2020-09-22 DIAGNOSIS — M6281 Muscle weakness (generalized): Secondary | ICD-10-CM

## 2020-09-22 DIAGNOSIS — R262 Difficulty in walking, not elsewhere classified: Secondary | ICD-10-CM

## 2020-09-22 MED ORDER — FLUDROCORTISONE ACETATE 0.1 MG PO TABS: 0.0500 mg | ORAL_TABLET | Freq: Two times a day (BID) | ORAL | 3 refills | Status: DC

## 2020-09-22 NOTE — Telephone Encounter (Signed)
Medication list updated.

## 2020-09-22 NOTE — Therapy (Signed)
[place], [place], [address] Phone: [phone]   Fax:  [phone]  Physical Therapy Treatment/Progress Note  Patient Details  Name: Cindy Meza MRN: [id number] Date of Birth: 03/05/1936 Referring Provider (PT): Lyndal Pulley, DO  Progress Note Reporting Period 08/16/20 to 09/22/20  See note below for Objective Data and Assessment of Progress/Goals.       Encounter Date: 09/22/2020   PT End of Session - 09/22/20 1500    Visit Number 10    Number of Visits 13    Date for PT Re-Evaluation 10/07/20    Authorization Type UHC MEDICARE    PT Start Time 6073    PT Stop Time 1530    PT Time Calculation (min) 43 min    Activity Tolerance Patient tolerated treatment well    Behavior During Therapy [facility] for tasks assessed/performed           Past Medical History:  Diagnosis Date  . Allergy   . Anemia   . Asthma    Dr. Melvyn Novas  . Blood transfusion without reported diagnosis   . Breast cyst    Left  . Cataract    Dr. Gershon Crane  . Complication of anesthesia    ' I HAD HALLUCINATIONS WHEN THEY DID MY HIP "  . GERD (gastroesophageal reflux disease)   . Heart murmur   . Hemorrhoids   . History of colonic polyps Cindy Meza   hx of adenomas (small) 1998  . OA (osteoarthritis)   . Orthostatic hypotension   . Osteopenia   . PAC (premature atrial contraction)   . Vitamin D deficiency     Past Surgical History:  Procedure Laterality Date  . ABDOMINAL HYSTERECTOMY     complete  . APPENDECTOMY    . BREAST CYST EXCISION    . CATARACT EXTRACTION, BILATERAL     04/23/18 left eye, 04/02/18 right eye  . COLONOSCOPY  Multiple   Adenomatous colon polyps  . ESOPHAGOGASTRODUODENOSCOPY  2010  . INTRAMEDULLARY (IM) NAIL INTERTROCHANTERIC Right 06/13/2016   Procedure: INTRAMEDULLARY (IM) NAIL INTERTROCHANTRIC;  Surgeon: Tania Ade, MD;  Location: [place];  Service: Orthopedics;  Laterality: Right;  .  TONSILLECTOMY      There were no vitals filed for this visit.   Subjective Assessment - 09/22/20 1457    Subjective Pt reports she is doing well and offered no concerns.    Diagnostic tests 07/20/20 Xray:    FINDINGS:  Five lumbar type vertebra. There is no acute fracture or subluxation  of the lumbar spine. Mild osteopenia and degenerative changes. The  visualized posterior elements are intact. The soft tissues are  unremarkable.     IMPRESSION:  No acute/traumatic lumbar spine pathology.    Patient Stated Goals To tolerte activity with less back pain.    Currently in Pain? No/denies    Pain Score 0-No pain    Pain Location Back    Pain Orientation Posterior    Pain Descriptors / Indicators Aching    Pain Type Chronic pain    Pain Onset More than a month ago                             [facility] Adult PT Treatment/Exercise - 09/22/20 0001      Self-Care   Self-Care Other Self-Care Comments    Other Self-Care Comments  Transfer t/f the floor using a chair for support 3x c L  LE as the support leg      Exercises   Exercises Lumbar;Knee/Hip      Lumbar Exercises: Aerobic   Nustep 5 mins; L5; UEs/LEs      Lumbar Exercises: Supine   Pelvic Tilt Limitations 12x   3 sec   Clam Limitations 15x   2 sets   Bent Knee Raise 10 reps   with core engagaed, 2 sets   Bridge 10 reps   2 sets   Straight Leg Raise 10 reps;2 seconds    Other Supine Lumbar Exercises hip adduction isometric 10x2, 3 sec      Knee/Hip Exercises: Supine   Straight Leg Raises --   12 reps                 PT Education - 09/22/20 1527    Education Details Floor transfer using a chait for support and assist    Person(s) Educated Patient    Methods Explanation;Demonstration;Tactile cues;Verbal cues;Handout    Comprehension Verbalized understanding;Returned demonstration;Verbal cues required;Tactile cues required            PT Short Term Goals - 09/01/20 1428      PT SHORT TERM GOAL #1    Title Pt will be Ind in an initial HEP    Baseline started on eval    Status Achieved    Target Date 09/01/20             PT Long Term Goals - 09/07/20 1511      PT LONG TERM GOAL #3   Title Improved FOTO score to 61% functional ability. met at 63%    Status Achieved    Target Date 09/07/20                 Plan - 09/22/20 1528    Clinical Impression Statement PT was provided for the instruction and completion of floor to standing transfers with assist from a stationary chair. Pt completed the transfer with proper technique.Additionally, pt participated in PT for lumbopelvic/hip/LE strengthening. Pt tolerated today's PT session without adverse effects.    Personal Factors and Comorbidities Comorbidity 3+;Past/Current Experience;Time since onset of injury/illness/exacerbation    Comorbidities OA, osteopenia, ortthostatic hypotension, R femur, R INTRAMEDULLARY (IM) NAIL INTERTROCHANTERIC 2017 after fall in yard    Examination-Activity Limitations Bend;Squat;Stand;Stairs;Locomotion Level;Lift    Examination-Participation Restrictions Cleaning;Laundry    Stability/Clinical Decision Making Stable/Uncomplicated    Clinical Decision Making Moderate    Rehab Potential Good    PT Frequency 2x / week    PT Duration 6 weeks    PT Treatment/Interventions ADLs/Self Care Home Management;Cryotherapy;Electrical Stimulation;Ultrasound;Moist Heat;Iontophoresis 87m/ml Dexamethasone;Functional mobility training;Therapeutic activities;Therapeutic exercise;Balance training;Manual techniques;Patient/family education;Passive range of motion;Dry needling;Taping    PT Next Visit Plan Floor transfer. Continuing body mechanics for household activities. Progress core/hip strengthening as tolerated including function based strengthening exs as R knee tolerates.    PT Home Exercise Plan [email or web address]   Consulted and Agree with Plan of Care Patient           Patient will benefit from skilled therapeutic  intervention in order to improve the following deficits and impairments:  Difficulty walking,Pain,Decreased activity tolerance,Decreased strength,Postural dysfunction,Decreased balance  Visit Diagnosis: Lumbar spine pain  Muscle weakness (generalized)  Difficulty in walking, not elsewhere classified     Problem List Patient Active Problem List   Diagnosis Date Noted  . Low back pain 07/20/2020  . Constipation 04/29/2020  . Anxiety 01/03/2020  . Rash 07/29/2019  . Nausea 02/12/2019  .  Aortic regurgitation 06/16/2018  . Fall 06/12/2016  . Closed right hip fracture ([place]) 06/12/2016  . Cerumen impaction 04/06/2016  . Fatigue 02/15/2016  . DOE (dyspnea on exertion) 02/15/2016  . Headache 09/27/2015  . Coronary atherosclerosis 06/29/2015  . Urgency of urination 05/17/2015  . Abdominal pain 03/30/2015  . Paresthesias 09/21/2014  . Breast pain, left 09/21/2014  . Chest pressure 07/22/2012  . Lightheadedness 07/17/2012  . Orthostatic hypotension 07/17/2012  . Hot flash, menopausal 02/15/2012  . Allergic rhinitis 09/12/2011  . Well adult exam 02/14/2011  . HEMORRHOIDS, WITH BLEEDING 10/05/2009  . Vitamin D deficiency 06/17/2007  . Asthma 06/17/2007  . GERD (gastroesophageal reflux disease) 06/17/2007  . OSTEOARTHRITIS 06/17/2007  . Osteoporosis 06/17/2007  . COLONIC POLYPS, HX OF 06/17/2007    Gar Ponto MS, PT 09/22/20 6:57 PM  [facility] [address] [place], [place], [address] Phone: [phone]   Fax:  [phone]  Name: Cindy Meza MRN: [id number] Date of Birth: 07-18-1935

## 2020-09-27 ENCOUNTER — Other Ambulatory Visit: Payer: Self-pay

## 2020-09-27 ENCOUNTER — Ambulatory Visit: Payer: Medicare Other

## 2020-09-27 DIAGNOSIS — M6281 Muscle weakness (generalized): Secondary | ICD-10-CM

## 2020-09-27 DIAGNOSIS — M545 Low back pain, unspecified: Secondary | ICD-10-CM | POA: Diagnosis not present

## 2020-09-27 DIAGNOSIS — R262 Difficulty in walking, not elsewhere classified: Secondary | ICD-10-CM

## 2020-09-27 NOTE — Therapy (Signed)
[place], [place], [address] Phone: [phone]   Fax:  [phone]  Physical Therapy Treatment  Patient Details  Name: Cindy Meza MRN: [id number] Date of Birth: [birth year] Referring Provider (PT): Lyndal Pulley, DO   Encounter Date: 09/27/2020   PT End of Session - 09/27/20 1410    Visit Number 11    Number of Visits 13    Date for PT Re-Evaluation 10/07/20    Authorization Type UHC MEDICARE    PT Start Time 1402    PT Stop Time 1444    PT Time Calculation (min) 42 min    Activity Tolerance Patient tolerated treatment well    Behavior During Therapy [facility] for tasks assessed/performed           Past Medical History:  Diagnosis Date  . Allergy   . Anemia   . Asthma    Dr. [facility] Novas  . Blood transfusion without reported diagnosis   . Breast cyst    Left  . Cataract    Dr. Gershon Crane  . Complication of anesthesia    ' I HAD HALLUCINATIONS WHEN THEY DID MY HIP "  . GERD (gastroesophageal reflux disease)   . Heart murmur   . Hemorrhoids   . History of colonic polyps [name]   hx of adenomas (small) 1998  . OA (osteoarthritis)   . Orthostatic hypotension   . Osteopenia   . PAC (premature atrial contraction)   . Vitamin D deficiency     Past Surgical History:  Procedure Laterality Date  . ABDOMINAL HYSTERECTOMY     complete  . APPENDECTOMY    . BREAST CYST EXCISION    . CATARACT EXTRACTION, BILATERAL     04/23/18 left eye, 04/02/18 right eye  . COLONOSCOPY  Multiple   Adenomatous colon polyps  . ESOPHAGOGASTRODUODENOSCOPY  2010  . INTRAMEDULLARY (IM) NAIL INTERTROCHANTERIC Right 06/13/2016   Procedure: INTRAMEDULLARY (IM) NAIL INTERTROCHANTRIC;  Surgeon: Tania Ade, MD;  Location: [facility];  Service: Orthopedics;  Laterality: Right;  . TONSILLECTOMY      There were no vitals filed for this visit.   Subjective Assessment - 09/27/20 1408    Subjective pt offered no concerns.  SHe states she is doing well.    Pertinent History OA, osteopenia, ortthostatic hypotension,  INTRAMEDULLARY (IM) NAIL INTERTROCHANTERIC 2017 after fall in yard    Limitations Standing;Walking    Diagnostic tests 07/20/20 Xray:    FINDINGS:  Five lumbar type vertebra. There is no acute fracture or subluxation  of the lumbar spine. Mild osteopenia and degenerative changes. The  visualized posterior elements are intact. The soft tissues are  unremarkable.     IMPRESSION:  No acute/traumatic lumbar spine pathology.    Patient Stated Goals To tolerte activity with less back pain.    Currently in Pain? No/denies    Pain Score 0-No pain    Pain Location Back    Pain Orientation Posterior    Pain Descriptors / Indicators Aching    Pain Type Chronic pain    Pain Onset More than a month ago    Aggravating Factors  prolonged walking or standing activities    Pain Relieving Factors sitting or lying down    Multiple Pain Sites No                             [facility] Adult PT Treatment/Exercise - 09/27/20 0001  Lumbar Exercises: Supine   Clam Limitations --   2 set     Knee/Hip Exercises: Aerobic   Nustep 5 mins; L5; arms and legs      Knee/Hip Exercises: Standing   Lateral Step Up Right;Left;15 reps;Hand Hold: 1;Step Height: 4"    Forward Step Up Right;Left;15 reps;Hand Hold: 1;Step Height: 4"      Knee/Hip Exercises: Seated   Long Arc Quad Right;Left;2 sets;10 reps    Long Arc Quad Weight 2 lbs.    Sit to Sand 5 reps   c 5 lbs 5x; and 5x without a weig     Knee/Hip Exercises: Sidelying   Clams 2 x 15 red theraband               Balance Exercises - 09/27/20 0001      Balance Exercises: Standing   SLS Eyes open;Solid surface;3 reps;20 secs;Upper extremity support 1               PT Short Term Goals - 09/01/20 1428      PT SHORT TERM GOAL #1   Title Pt will be Ind in an initial HEP    Baseline started on eval    Status Achieved    Target Date  09/01/20             PT Long Term Goals - 09/07/20 1511      PT LONG TERM GOAL #3   Title Improved FOTO score to 61% functional ability. met at 63%    Status Achieved    Target Date 09/07/20                 Plan - 09/27/20 1411    Clinical Impression Statement PT was completed to address LE/lumbopelvic strengthening. CKC strengthening exs were added as well as SLS balance exs. Pt tolerated the PT session without adverse effects.    Personal Factors and Comorbidities Comorbidity 3+;Past/Current Experience;Time since onset of injury/illness/exacerbation    Comorbidities OA, osteopenia, ortthostatic hypotension, R femur, R INTRAMEDULLARY (IM) NAIL INTERTROCHANTERIC 2017 after fall in yard    Examination-Activity Limitations Bend;Squat;Stand;Stairs;Locomotion Level;Lift    Examination-Participation Restrictions Cleaning;Laundry    Clinical Decision Making Moderate    Rehab Potential Good    PT Frequency 2x / week    PT Duration 6 weeks    PT Treatment/Interventions ADLs/Self Care Home Management;Cryotherapy;Electrical Stimulation;Ultrasound;Moist Heat;Iontophoresis 66m/ml Dexamethasone;Functional mobility training;Therapeutic activities;Therapeutic exercise;Balance training;Manual techniques;Patient/family education;Passive range of motion;Dry needling;Taping    PT Next Visit Plan Progress core/hip strengthening as tolerated including function based strengthening exs as R knee tolerates.    PT Home Exercise Plan [email or web address]   Consulted and Agree with Plan of Care Patient           Patient will benefit from skilled therapeutic intervention in order to improve the following deficits and impairments:  Difficulty walking,Pain,Decreased activity tolerance,Decreased strength,Postural dysfunction,Decreased balance  Visit Diagnosis: Lumbar spine pain  Muscle weakness (generalized)  Difficulty in walking, not elsewhere classified     Problem List Patient Active Problem List    Diagnosis Date Noted  . Low back pain 07/20/2020  . Constipation 04/29/2020  . Anxiety 01/03/2020  . Rash 07/29/2019  . Nausea 02/12/2019  . Aortic regurgitation 06/16/2018  . Fall 06/12/2016  . [place] 06/12[place]2016  . Cerumen impaction 04/06/2016  . Fatigue 02/15/2016  . DOE (dyspnea on exertion) 02/15/2016  . Headache 09/27/2015  . Coronary atherosclerosis 06/29/2015  . Urgency of urination 05/17/2015  .  Abdominal pain 03/30/2015  . Paresthesias 09/21/2014  . Breast pain, left 09/21/2014  . Chest pressure 07/22/2012  . Lightheadedness 07/17/2012  . Orthostatic hypotension 07/17/2012  . Hot flash, menopausal 02/15/2012  . Allergic rhinitis 09/12/2011  . Well adult exam 02/14/2011  . HEMORRHOIDS, WITH BLEEDING 10/05/2009  . Vitamin D deficiency 06/17/2007  . Asthma 06/17/2007  . GERD (gastroesophageal reflux disease) 06/17/2007  . OSTEOARTHRITIS 06/17/2007  . Osteoporosis 06/17/2007  . COLONIC POLYPS, HX OF 06/17/2007    Gar Ponto MS, PT 09/27/20 2:55 PM  [facility] [address] [place], [place], [address] Phone: [phone]   Fax:  [phone]  Name: Shanterica Biehler MRN: [id number] Date of Birth: 1936/04/05

## 2020-09-29 ENCOUNTER — Other Ambulatory Visit: Payer: Self-pay

## 2020-09-29 ENCOUNTER — Ambulatory Visit: Payer: Medicare Other

## 2020-09-29 DIAGNOSIS — M545 Low back pain, unspecified: Secondary | ICD-10-CM

## 2020-09-29 DIAGNOSIS — M6281 Muscle weakness (generalized): Secondary | ICD-10-CM

## 2020-09-29 DIAGNOSIS — R262 Difficulty in walking, not elsewhere classified: Secondary | ICD-10-CM

## 2020-09-29 NOTE — Therapy (Addendum)
[facility] [place], [place], [address] Phone: [phone]   Fax:  [phone]  Physical Therapy Treatment/Discharge  Patient Details  Name: Cindy Meza MRN: [id number] Date of Birth: [birth year] Referring Provider (PT): Lyndal Pulley, DO   Encounter Date: 09/29/2020   PT End of Session - 09/29/20 1504     Visit Number 12    Number of Visits 13    Date for PT Re-Evaluation 10/07/20    Authorization Type [employer] MEDICARE    PT Start Time 1447    PT Stop Time 1530    PT Time Calculation (min) 43 min    Activity Tolerance Patient tolerated treatment well    Behavior During Therapy [facility] for tasks assessed/performed             Past Medical History:  Diagnosis Date   Allergy    Anemia    Asthma    Dr. Melvyn Novas   Blood transfusion without reported diagnosis    Breast cyst    Left   Cataract    Dr. Gershon Crane   Complication of anesthesia    ' I HAD [place] DID MY HIP "   GERD (gastroesophageal reflux disease)    Heart murmur    Hemorrhoids    History of colonic polyps [facility]   hx of adenomas (small) 1998   OA (osteoarthritis)    Orthostatic hypotension    Osteopenia    PAC (premature atrial contraction)    Vitamin D deficiency     Past Surgical History:  Procedure Laterality Date   ABDOMINAL HYSTERECTOMY     complete   APPENDECTOMY     BREAST CYST EXCISION     CATARACT EXTRACTION, BILATERAL     04/23/18 left eye, 04/02/18 right eye   COLONOSCOPY  Multiple   Adenomatous colon polyps   ESOPHAGOGASTRODUODENOSCOPY  2010   INTRAMEDULLARY (IM) NAIL INTERTROCHANTERIC Right 06/13/2016   Procedure: INTRAMEDULLARY (IM) NAIL INTERTROCHANTRIC;  Surgeon: Tania Ade, MD;  Location: [facility];  Service: Orthopedics;  Laterality: Right;   TONSILLECTOMY      There were no vitals filed for this visit.   Subjective Assessment - 09/29/20 1454     Subjective Pt feels she is getting stronger  and is using the tips for body mechanics with houseold activities. Pt states she raked her yard an tolerated it well.    Pertinent History OA, osteopenia, ortthostatic hypotension,  INTRAMEDULLARY (IM) NAIL INTERTROCHANTERIC 2017 after fall in yard    Limitations Standing;Walking    Diagnostic tests 07/20/20 Xray:    FINDINGS:  Five lumbar type vertebra. There is no acute fracture or subluxation  of the lumbar spine. Mild osteopenia and degenerative changes. The  visualized posterior elements are intact. The soft tissues are  unremarkable.     IMPRESSION:  No acute/traumatic lumbar spine pathology.    Patient Stated Goals To tolerte activity with less back pain.    Currently in Pain? No/denies    Pain Score 0-No pain    Pain Location Back    Pain Orientation Posterior    Pain Descriptors / Indicators Aching    Pain Type Chronic pain    Pain Onset More than a month ago                               [facility] Adult PT Treatment/Exercise - 09/29/20 0001       Exercises  Exercises Lumbar;Knee/Hip      Lumbar Exercises: Aerobic   Nustep 5 mins; L5; UEs/LEs      Lumbar Exercises: Standing   Row Both;15 reps    Theraband Level (Row) Level 2 (Red)    Shoulder Extension Both;15 reps    Theraband Level (Shoulder Extension) Level 2 (Red)    Other Standing Lumbar Exercises Hinged hip lift from 12 inches from floor to waist level c 10 lbs; 10x2      Lumbar Exercises: Supine   Clam Limitations 15x   2 sets   Bent Knee Raise 10 reps   with core engagaed, 2 sets   Bridge 15 reps   2 sets   Straight Leg Raise 10 reps;2 seconds   2 sets   Straight Leg Raises Limitations 2 lbs    Other Supine Lumbar Exercises hip adduction isometric 10x2, 3 sec      Knee/Hip Exercises: Seated   Long Arc Quad Right;Left;2 sets;10 reps      Knee/Hip Exercises: Supine   Straight Leg Raises Right;Left;2 sets;10 reps                      PT Short Term Goals - 09/01/20 1428        PT SHORT TERM GOAL #1   Title Pt will be Ind in an initial HEP    Baseline started on eval    Status Achieved    Target Date 09/01/20               PT Long Term Goals - 09/07/20 1511       PT LONG TERM GOAL #3   Title Improved FOTO score to 61% functional ability. met at 63%    Status Achieved    Target Date 09/07/20                   Plan - 09/29/20 1503     Clinical Impression Statement Continued PT for LE/lumbopelvic strengthening including hinged hip lifting with 10 lbs. Pt is tolerating gradual progression with exs for pres and or resistance level. Pt reports improved tolerance with activities at home.    Personal Factors and Comorbidities Comorbidity 3+;Past/Current Experience;Time since onset of injury/illness/exacerbation    Comorbidities OA, osteopenia, ortthostatic hypotension, R femur, R INTRAMEDULLARY (IM) NAIL INTERTROCHANTERIC 2017 after fall in yard    Examination-Activity Limitations Bend;Squat;Stand;Stairs;Locomotion Level;Lift    Examination-Participation Restrictions Cleaning;Laundry    Stability/Clinical Decision Making Stable/Uncomplicated    Clinical Decision Making Moderate    Rehab Potential Good    PT Frequency 2x / week    PT Duration 6 weeks    PT Treatment/Interventions ADLs/Self Care Home Management;Cryotherapy;Electrical Stimulation;Ultrasound;Moist Heat;Iontophoresis 21m/ml Dexamethasone;Functional mobility training;Therapeutic activities;Therapeutic exercise;Balance training;Manual techniques;Patient/family education;Passive range of motion;Dry needling;Taping    PT Next Visit Plan Re-assess anticipate DC next visit.    PT Home Exercise Plan [id number]    [id number] Agree with Plan of Care Patient             Patient will benefit from skilled therapeutic intervention in order to improve the following deficits and impairments:  Difficulty walking,Pain,Decreased activity tolerance,Decreased strength,Postural dysfunction,Decreased  balance  Visit Diagnosis: Lumbar spine pain  Muscle weakness (generalized)  Difficulty in walking, not elsewhere classified     Problem List Patient Active Problem List   Diagnosis Date Noted   Low back pain 07/20/2020   Constipation 04/29/2020   Anxiety 01/03/2020   Rash 07/29/2019   Nausea 02/12/2019  Aortic regurgitation 06/16/2018   Fall 06/12/2016   Closed right hip fracture ([place]) 06/12/2016   Cerumen impaction 04/06/2016   Fatigue 02/15/2016   DOE (dyspnea on exertion) 02/15/2016   Headache 09/27/2015   Coronary atherosclerosis 06/29/2015   Urgency of urination 05/17/2015   Abdominal pain 03/30/2015   Paresthesias 09/21/2014   Breast pain, left 09/21/2014   Chest pressure 07/22/2012   Lightheadedness 07/17/2012   Orthostatic hypotension 07/17/2012   Hot flash, menopausal 02/15/2012   Allergic rhinitis 09/12/2011   Well adult exam 02/14/2011   HEMORRHOIDS, WITH BLEEDING 10/05/2009   Vitamin D deficiency 06/17/2007   Asthma 06/17/2007   GERD (gastroesophageal reflux disease) 06/17/2007   OSTEOARTHRITIS 06/17/2007   Osteoporosis 06/17/2007   COLONIC POLYPS, HX OF 06/17/2007    Gar Ponto MS, PT 09/29/20 5:01 PM  [facility] [address] [place], [place], [address] Phone: [phone]   Fax:  [phone]  Name: Mitchell Epling MRN: [id number] Date of Birth: Jun 06, 1936  PHYSICAL THERAPY DISCHARGE SUMMARY  Visits from Start of Care: 12  Current functional level related to goals / functional outcomes: See above   Remaining deficits: See above   Education / Equipment: HEP  Patient agrees to discharge. Patient goals were met. Patient is being discharged due to being pleased with the current functional level.   Axyl Sitzman MS, PT 02/04/21 10:00 AM

## 2020-09-30 ENCOUNTER — Telehealth: Payer: Self-pay | Admitting: Internal Medicine

## 2020-09-30 NOTE — Telephone Encounter (Signed)
Dr. Harrington Challenger spoke w patient 3/16 Adv her to stop Crestor and change florinef to 0.1mg  --1/2 tablet twice a day.   Feels off balance, weak legs and vision is sometimes blurred. Not all day long.  In the mornings she is sometimes nauseated.  I asked her twice if she is weak and blurry vision at the same time as being nauseated.  Twice she said "it is hard to tell". Gets a little SOB walking out to get mail at times she is SOB.  This is not new, not everyday. She continues to wonder if it is s/e from the florinef.  Takes with food.      She wants to know what else can be prescribed instead of florinef.  BPs over the last week: 3/17 - 94/64, 90 3/18 - 92/59, 102/63, 78 3/20 - 113/74. 81 3/21 - 118/68, 90 3/22 - 122/63 79 Since then 112/75, 85, 104/87, 81  Today 115/71, 84.  She is aware that I am forwarding to Dr. Harrington Challenger for review and we will call her back with any new recommendations.

## 2020-09-30 NOTE — Telephone Encounter (Signed)
   STAT if patient feels like he/she is going to faint   1) Are you dizzy now? No  2) Do you feel faint or have you passed out? No  3) Do you have any other symptoms? nausea and lightheadedness   4) Have you checked your HR and BP (record if available)?   Pt said she is following up her issue last week and she said her dizziness is about the same still having issue with equilibrium feel nauseated and sometimes lightheadedness. She wanted to speak with Michalene again

## 2020-10-04 NOTE — Telephone Encounter (Signed)
Spoke with pt    BP 100s/ yesterday REcomm:  Fluids  Salt SPANX, activity   Continue florinef     She says she takes AM dose at 11 am   That is too late   She gets up at 9 am   I would recomm taking earlier   9 am  when she gets up Now pt says that she has side effects with florinef  Nauseated   BUt she tolerated before   [facility] is scheduled to see me in May    [facility] will take BP more often Bring log     I recomm that she take 0.05 florinef at 9 am (with snack even) amd 2 PM  Could even boost am dose to 0.1  Try to fit in an April appt   Not sure when I see her in may  I am reluctant to jump ship to midodrine since she had done so well on florinef recently

## 2020-10-06 ENCOUNTER — Other Ambulatory Visit: Payer: Self-pay

## 2020-10-06 ENCOUNTER — Encounter: Payer: Self-pay | Admitting: Internal Medicine

## 2020-10-06 ENCOUNTER — Ambulatory Visit (INDEPENDENT_AMBULATORY_CARE_PROVIDER_SITE_OTHER): Payer: Medicare Other | Admitting: Internal Medicine

## 2020-10-06 ENCOUNTER — Ambulatory Visit: Payer: Medicare Other

## 2020-10-06 DIAGNOSIS — E559 Vitamin D deficiency, unspecified: Secondary | ICD-10-CM | POA: Diagnosis not present

## 2020-10-06 DIAGNOSIS — M545 Low back pain, unspecified: Secondary | ICD-10-CM

## 2020-10-06 DIAGNOSIS — R634 Abnormal weight loss: Secondary | ICD-10-CM | POA: Diagnosis not present

## 2020-10-06 DIAGNOSIS — I2510 Atherosclerotic heart disease of native coronary artery without angina pectoris: Secondary | ICD-10-CM | POA: Diagnosis not present

## 2020-10-06 DIAGNOSIS — G8929 Other chronic pain: Secondary | ICD-10-CM

## 2020-10-06 DIAGNOSIS — R5383 Other fatigue: Secondary | ICD-10-CM

## 2020-10-06 MED ORDER — MEGESTROL ACETATE 400 MG/10ML PO SUSP: 200.0000 mg | Freq: Two times a day (BID) | ORAL | 3 refills | Status: AC

## 2020-10-06 NOTE — Assessment & Plan Note (Signed)
CFS 

## 2020-10-06 NOTE — Assessment & Plan Note (Signed)
On krill oil

## 2020-10-06 NOTE — Assessment & Plan Note (Signed)
Better after PT

## 2020-10-06 NOTE — Assessment & Plan Note (Addendum)
Try Megace  Potential benefits of a long term steroid  use as well as potential risks  and complications were explained to the patient and were acknowledged Goal wt 110-115 lbs

## 2020-10-06 NOTE — Progress Notes (Signed)
Subjective:  Patient ID: Cindy Meza, female    DOB: [birth year]  Age: 85 y.o. MRN: [id number]  CC: Follow-up (3 month follow up)   HPI Cindy Meza presents for fatigue, weakness, pain in the abdomen, LBP. Dr Harrington Challenger has adjusted her Florinef. Pt had PT for LBP C/o wt loss - good appetite Outpatient Medications Prior to Visit  Medication Sig Dispense Refill  . ADVAIR HFA 115-21 MCG/ACT inhaler INHALE 2 PUFFS INTO THE LUNGS TWICE DAILY 12 g 3  . aspirin 81 MG tablet Take 81 mg by mouth daily.    . Calcium-Magnesium-Vitamin D (CALCIUM 500 PO) Take 1 each by mouth daily. Take 1 capsule with breakfast    . Cholecalciferol 100 MCG (4000 UT) TABS Take 4,000 Units by mouth daily. 30 tablet   . [employer] OMEGA-3 KRILL OIL 500 MG CAPS Take 500 capsules by mouth daily.    . Cyanocobalamin (VITAMIN B-12) 1000 MCG SUBL Place 1 tablet (1,000 mcg total) under the tongue daily. 100 tablet 3  . famotidine (PEPCID) 40 MG tablet Take 40 mg by mouth at bedtime.    . fludrocortisone (FLORINEF) 0.1 MG tablet Take 0.5 tablets (0.05 mg total) by mouth 2 (two) times daily. AM and 2PM 45 tablet 3  . fluticasone (FLONASE) 50 MCG/ACT nasal spray Place 2 sprays into both nostrils daily. 16 g 6  . loratadine (CLARITIN) 10 MG tablet Take 10 mg by mouth daily.    Marland Kitchen nystatin-triamcinolone ointment (MYCOLOG) Apply topically 2 (two) times daily as needed.    . polyethylene glycol (MIRALAX / GLYCOLAX) 17 g packet Take 17 g by mouth daily.     No facility-administered medications prior to visit.    ROS: Review of Systems  Constitutional: Positive for fatigue. Negative for activity change, appetite change, chills and unexpected weight change.  HENT: Negative for congestion, mouth sores and sinus pressure.   Eyes: Negative for visual disturbance.  Respiratory: Negative for cough, chest tightness and shortness of breath.   Cardiovascular: Negative for chest pain, palpitations and leg swelling.  Gastrointestinal:  Positive for abdominal pain. Negative for nausea.  Genitourinary: Negative for difficulty urinating, frequency and vaginal pain.  Musculoskeletal: Negative for back pain and gait problem.  Skin: Negative for pallor and rash.  Neurological: Positive for weakness. Negative for dizziness, tremors, numbness and headaches.  Psychiatric/Behavioral: Negative for confusion and sleep disturbance.    Objective:  BP 110/74 (BP Location: Left Arm, Patient Position: Sitting, Cuff Size: Normal)   Pulse 80   Temp 98.1 F (36.7 C) (Oral)   Ht 5' 1.5" (1.562 m)   Wt 102 lb (46.3 kg)   SpO2 99%   BMI 18.96 kg/m   BP Readings from Last 3 Encounters:  10/06/20 110/74  08/04/20 128/64  07/20/20 112/82    Wt Readings from Last 3 Encounters:  10/06/20 102 lb (46.3 kg)  08/04/20 105 lb (47.6 kg)  07/20/20 104 lb (47.2 kg)    Physical Exam Constitutional:      General: She is not in acute distress.    Appearance: She is well-developed. She is not ill-appearing or toxic-appearing.  HENT:     Head: Normocephalic.     Right Ear: External ear normal.     Left Ear: External ear normal.     Nose: Nose normal.  Eyes:     General:        Right eye: No discharge.        Left eye: No discharge.  Conjunctiva/sclera: Conjunctivae normal.     Pupils: Pupils are equal, round, and reactive to light.  Neck:     Thyroid: No thyromegaly.     Vascular: No JVD.     Trachea: No tracheal deviation.  Cardiovascular:     Rate and Rhythm: Normal rate and regular rhythm.     Heart sounds: Normal heart sounds.  Pulmonary:     Effort: No respiratory distress.     Breath sounds: No stridor. No wheezing.  Abdominal:     General: Bowel sounds are normal. There is no distension.     Palpations: Abdomen is soft. There is no mass.     Tenderness: There is no abdominal tenderness. There is no guarding or rebound.  Musculoskeletal:        General: No tenderness.     Cervical back: Normal range of motion and neck  supple.  Lymphadenopathy:     Cervical: No cervical adenopathy.  Skin:    Findings: No erythema or rash.  Neurological:     Mental Status: She is oriented to person, place, and time.     Cranial Nerves: No cranial nerve deficit.     Motor: No abnormal muscle tone.     Coordination: Coordination normal.     Deep Tendon Reflexes: Reflexes normal.  Psychiatric:        Behavior: Behavior normal.        Thought Content: Thought content normal.        Judgment: Judgment normal.   Thin  Lab Results  Component Value Date   WBC 5.9 09/16/2019   HGB 11.1 (L) 09/16/2019   HCT 33.2 (L) 09/16/2019   PLT 266.0 09/16/2019   GLUCOSE 97 07/14/2020   CHOL 208 (H) 02/26/2020   TRIG 50 02/26/2020   HDL 110 02/26/2020   LDLDIRECT 104.0 03/17/2013   LDLCALC 89 02/26/2020   ALT 13 10/26/2019   AST 16 02/26/2020   NA 141 07/14/2020   K 3.7 07/14/2020   CL 105 07/14/2020   CREATININE 0.87 07/14/2020   BUN 14 07/14/2020   CO2 22 07/14/2020   TSH 1.52 01/19/2019   INR 1.04 06/12/2016   HGBA1C 4.9 04/09/2017    CT Abdomen Pelvis W Contrast  Result Date: 09/28/2019 CLINICAL DATA:  Periumbilical pain weight loss.  Constipation. EXAM: CT ABDOMEN AND PELVIS WITH CONTRAST TECHNIQUE: Multidetector CT imaging of the abdomen and pelvis was performed using the standard protocol following bolus administration of intravenous contrast. CONTRAST:  156mL ISOVUE-300 IOPAMIDOL (ISOVUE-300) INJECTION 61% COMPARISON:  04/28/2015 FINDINGS: Lower chest: No acute abnormality. Hepatobiliary: No focal liver abnormality is seen. No gallstones, gallbladder wall thickening, or biliary dilatation. Pancreas: Unremarkable. No pancreatic ductal dilatation or surrounding inflammatory changes. Spleen: Normal in size without focal abnormality. Adrenals/Urinary Tract: Normal appearance of the adrenal glands. No kidney mass or hydronephrosis identified bilaterally urinary bladder unremarkable. Stomach/Bowel: Stomach is within normal  limits. The appendix is not confidently identified. No evidence of bowel wall thickening, distention, or inflammatory changes. Vascular/Lymphatic: Aortic atherosclerosis. No aneurysm. No abdominopelvic adenopathy identified. Reproductive: Status post hysterectomy. No adnexal masses. Other: No abdominal wall hernia or abnormality. No abdominopelvic ascites. Musculoskeletal: Previous ORIF of right femur. Sclerotic lesion identified within the left sacral wing, unchanged. Favor benign bone island. IMPRESSION: 1. No acute findings within the abdomen or pelvis. No mass or adenopathy noted. 2.  Aortic Atherosclerosis (ICD10-I70.0). Electronically Signed   By: Kerby Moors M.D.   On: 09/28/2019 12:52    Assessment & Plan:  [name], MD

## 2020-10-06 NOTE — Patient Instructions (Signed)
Megestrol oral suspension What is this medicine? MEGESTROL (me JES trol) suspension improves appetite and helps cause weight gain. It is used in conditions that cause significant loss of appetite and weight, such as AIDS. This medicine may be used for other purposes; ask your health care provider or pharmacist if you have questions. COMMON BRAND NAME(S): Megace, Megace ES What should I tell my health care provider before I take this medicine? They need to know if you have any of these conditions:  adrenal gland problems  history of blood clots of the legs, lungs, or other parts of the body  diabetes  kidney disease  liver disease  stroke  an unusual or allergic reaction to megestrol, other medicines, foods, dyes, or preservatives  pregnant or trying to get pregnant  breast-feeding How should I use this medicine? Take this medicine by mouth. Follow the directions on the prescription label. Shake well before using. Use a specially marked spoon or container to measure your medicine. Ask your pharmacist if you do not have one. Household spoons are not accurate. Take your medicine at regular intervals. Do not take your medicine more often than directed. Do not stop taking except on your doctor's advice. Contact your pediatrician or health care provider regarding the use of this medicine in children. Special care may be needed. Overdosage: If you think you have taken too much of this medicine contact a poison control center or emergency room at once. NOTE: This medicine is only for you. Do not share this medicine with others. What if I miss a dose? If you miss or forget a single dose, continue with your next regularly scheduled dose on the next day. Do not take double or extra doses. What may interact with this medicine? Do not take this medicine with any of the following medications:  dofetilide This medicine may also interact with the following medications:  indinavir This list may  not describe all possible interactions. Give your health care provider a list of all the medicines, herbs, non-prescription drugs, or dietary supplements you use. Also tell them if you smoke, drink alcohol, or use illegal drugs. Some items may interact with your medicine. What should I watch for while using this medicine? Visit your doctor or health care professional for regular checks on your progress. It may take up to 3 weeks to first notice an improved appetite. It may take up to 12 weeks to notice weight gain. This medicine can cause birth defects. Do not get pregnant while taking this drug. Females with child-bearing potential will need to have a negative pregnancy test before starting this medicine. Use an effective method of birth control while you are taking this medicine. If you think that you might be pregnant talk to your doctor right away. This medicine may affect blood sugar levels. If you have diabetes, check with your doctor or health care professional before you change your diet or the dose of your diabetic medicine. What side effects may I notice from receiving this medicine? Side effects that you should report to your doctor or health care professional as soon as possible:  allergic reactions like skin rash, itching or hives, swelling of the face, lips, or tongue  breathing problems  dizziness  increased blood pressure  signs and symptoms of a blood clot such as breathing problems; changes in vision; chest pain; severe, sudden headache; pain, swelling, warmth in the leg; trouble speaking; sudden numbness or weakness of the face, arm or leg  swelling of the  ankles, feet, hands  unusually weak or tired  vomiting Side effects that usually do not require medical attention (report to your doctor or health care professional if they continue or are bothersome):  breakthrough menstrual bleeding  changes in sex drive or performance  diarrhea  gas  hot flashes or  flushing  increased appetite  upset stomach  weight gain This list may not describe all possible side effects. Call your doctor for medical advice about side effects. You may report side effects to FDA at 1-800-FDA-1088. Where should I keep my medicine? Keep out of the reach of children. Store at room temperature between 15 and 25 degrees C (59 and 77 degrees F). Keep tightly closed. Protect from heat. Throw away any unused medicine after the expiration date. NOTE: This sheet is a summary. It may not cover all possible information. If you have questions about this medicine, talk to your doctor, pharmacist, or health care provider.  2021 Elsevier/Gold Standard (2017-07-03 10:06:45)

## 2020-10-06 NOTE — Assessment & Plan Note (Signed)
On Vit D 

## 2020-10-11 ENCOUNTER — Telehealth: Payer: Self-pay

## 2020-10-11 NOTE — Telephone Encounter (Signed)
Returned pt's call re: her question about the 10/04/20, her last, PT appt being cancelled. The clinic records indicate the pt cancellled the appt. The pt states she did not. Pt is to see Dr. Hulan Saas tomorrow and will schedule a final PT appt as indicated.

## 2020-10-11 NOTE — Progress Notes (Signed)
[place] Phone: [phone] Subjective:   Cindy Meza, am serving as a scribe for Dr. Hulan Saas. This visit occurred during the SARS-CoV-2 public health emergency.  Safety protocols were in place, including screening questions prior to the visit, additional usage of staff PPE, and extensive cleaning of exam room while observing appropriate contact time as indicated for disinfecting solutions.   I'm seeing this patient by the request  of:  [name], Evie Lacks, MD  CC: Low back pain follow-up  EXB:[id number]   07/20/2020 Patient is having low back pain, vomiting does seem to be more consistent with potential spinal stenosis with patient fatiguing more than truly having any weakness.  Patient on exam does not have any true radicular symptoms but does have mild atrophy of the lower extremities.  I do believe that there is some deconditioning occurring.  I do feel that formal physical therapy will be beneficial.  Patient has had allergy to certain medications including gabapentin and continues to have some difficulty with her blood pressure so we will hold on any type of prescription medications at the moment.  Increase activity slowly.  Follow-up again 6 to 8 weeks.  Continue vitamin D and core strengthening.  Sent to physical therapy  Patient is being treated properly by endocrinology and doing very well.  Continue vitamin D.   Update 10/12/2020 Cindy Meza is a 85 y.o. female coming in with complaint of low back pain.  Patient has been doing formal physical therapy.  Patient states that her pain is improving. Pain occurs when she is on her feet. Little pain at rest. Has not noticed if she is better or not.  Patient does state that overall certain daily activities has been more beneficial recently and not having as much severe pain.    X-rays were taken September 28, 2019.  X-ray showed some mild osteopenia and  degenerative changes of the lumbar spine  Past Medical History:  Diagnosis Date  . Allergy   . Anemia   . Asthma    Dr. Melvyn Novas  . Blood transfusion without reported diagnosis   . Breast cyst    Left  . Cataract    Dr. Gershon Crane  . Complication of anesthesia    ' I HAD HALLUCINATIONS WHEN THEY DID MY HIP "  . GERD (gastroesophageal reflux disease)   . Heart murmur   . Hemorrhoids   . History of colonic polyps [name]   hx of adenomas (small) 1998  . OA (osteoarthritis)   . Orthostatic hypotension   . Osteopenia   . PAC (premature atrial contraction)   . Vitamin D deficiency    Past Surgical History:  Procedure Laterality Date  . ABDOMINAL HYSTERECTOMY     complete  . APPENDECTOMY    . BREAST CYST EXCISION    . CATARACT EXTRACTION, BILATERAL     04/23/18 left eye, 04/02/18 right eye  . COLONOSCOPY  Multiple   Adenomatous colon polyps  . ESOPHAGOGASTRODUODENOSCOPY  2010  . INTRAMEDULLARY (IM) NAIL INTERTROCHANTERIC Right 06/13/2016   Procedure: INTRAMEDULLARY (IM) NAIL INTERTROCHANTRIC;  Surgeon: Tania Ade, MD;  Location: [facility];  Service: Orthopedics;  Laterality: Right;  . TONSILLECTOMY     Social History   Socioeconomic History  . Marital status: Widowed    Spouse name: Not on file  . Number of children: Not on file  . Years of education: Not on file  . Highest education level: Not  on file  Occupational History  . Occupation: retired  Tobacco Use  . Smoking status: Never Smoker  . Smokeless tobacco: Never Used  Vaping Use  . Vaping Use: Never used  Substance and Sexual Activity  . Alcohol use: Yes    Alcohol/week: 0.0 standard drinks    Comment: 3-4 times per week - vodka 1 drink  . Drug use: Meza  . Sexual activity: Not Currently  Other Topics Concern  . Not on file  Social History Narrative   She lives alone in a [place] home.  Meza children.   Retired Education officer, museum.   Highest level of education:  Masters degree   Social Determinants of Health    Financial Resource Strain: Low Risk   . Difficulty of Paying Living Expenses: Not hard at all  Food Insecurity: Meza Food Insecurity  . Worried About Charity fundraiser in the Last Year: Never true  . Ran Out of Food in the Last Year: Never true  Transportation Needs: Meza Transportation Needs  . Lack of Transportation (Medical): Meza  . Lack of Transportation (Non-Medical): Meza  Physical Activity: Inactive  . Days of Exercise per Week: 0 days  . Minutes of Exercise per Session: 0 min  Stress: Meza Stress Concern Present  . Feeling of Stress : Not at all  Social Connections: Moderately Integrated  . Frequency of Communication with Friends and Family: More than three times a week  . Frequency of Social Gatherings with Friends and Family: More than three times a week  . Attends Religious Services: More than 4 times per year  . Active Member of Clubs or Organizations: Yes  . Attends Archivist Meetings: More than 4 times per year  . Marital Status: Widowed   Allergies  Allergen Reactions  . Fosamax [Alendronate Sodium]     Paresthesias, abd pain  . Gabapentin     dizziness  . Lidocaine Swelling    Of face and lips.  Novocaine does same thing.  . Midodrine     Nausea   . Risedronate Sodium     Knee pain  . Shellfish Allergy Hives  . Statins     REACTION: pains   Family History  Problem Relation Age of Onset  . Diabetes Mother 57       Deceased  . Hypertension Other   . Colon cancer Neg Hx   . Pancreatic cancer Neg Hx   . Stomach cancer Neg Hx   . Esophageal cancer Neg Hx   . Rectal cancer Neg Hx     Current Outpatient Medications (Endocrine & Metabolic):  .  fludrocortisone (FLORINEF) 0.1 MG tablet, Take 0.5 tablets (0.05 mg total) by mouth 2 (two) times daily. AM and 2PM   Current Outpatient Medications (Respiratory):  Marland Kitchen  ADVAIR HFA 115-21 MCG/ACT inhaler, INHALE 2 PUFFS INTO THE LUNGS TWICE DAILY .  fluticasone (FLONASE) 50 MCG/ACT nasal spray, Place 2  sprays into both nostrils daily. Marland Kitchen  loratadine (CLARITIN) 10 MG tablet, Take 10 mg by mouth daily.  Current Outpatient Medications (Analgesics):  .  aspirin 81 MG tablet, Take 81 mg by mouth daily.  Current Outpatient Medications (Hematological):  Marland Kitchen  Cyanocobalamin (VITAMIN B-12) 1000 MCG SUBL, Place 1 tablet (1,000 mcg total) under the tongue daily.  Current Outpatient Medications (Other):  Marland Kitchen  Calcium-Magnesium-Vitamin D (CALCIUM 500 PO), Take 1 each by mouth daily. Take 1 capsule with breakfast .  Cholecalciferol 100 MCG (4000 UT) TABS, Take 4,000 Units by mouth  daily. .  CVS OMEGA-3 KRILL OIL 500 MG CAPS, Take 500 capsules by mouth daily. .  famotidine (PEPCID) 40 MG tablet, Take 40 mg by mouth at bedtime. .  megestrol (MEGACE) 400 MG/10ML suspension, Take 5 mLs (200 mg total) by mouth 2 (two) times daily. Marland Kitchen  nystatin-triamcinolone ointment (MYCOLOG), Apply topically 2 (two) times daily as needed. .  polyethylene glycol (MIRALAX / GLYCOLAX) 17 g packet, Take 17 g by mouth daily.   Reviewed prior external information including notes and imaging from  primary care provider As well as notes that were available from care everywhere and other healthcare systems.  Past medical history, social, surgical and family history all reviewed in electronic medical record.  Meza pertanent information unless stated regarding to the chief complaint.   Review of Systems:  Meza headache, visual changes, nausea, vomiting, diarrhea, constipation, dizziness, abdominal pain, skin rash, fevers, chills, night sweats, weight loss, swollen lymph nodes, body aches, joint swelling, chest pain, shortness of breath, mood changes. POSITIVE muscle aches  Objective  Blood pressure 122/72, pulse 90, height 5' 1.5" (1.562 m), weight 106 lb (48.1 kg), SpO2 99 %.   General: Meza apparent distress alert and oriented x3 mood and affect normal, dressed appropriately.  HEENT: Pupils equal, extraocular movements intact   Respiratory: Patient's speak in full sentences and does not appear short of breath  Cardiovascular: Meza lower extremity edema, non tender, Meza erythema  Gait normal with good balance and coordination.  MSK: Arthritic changes in multiple joints Low back exam shows mild loss of lordosis.  Patient does still have some limited range of motion in all planes.  Limited pain with this.  Patient does have some decrease in external range of motion of the hips bilaterally.  Neurovascularly intact distally though.  4 out of 5 strength in the lower extremities but symmetric.    Impression and Recommendations:     The above documentation has been reviewed and is accurate and complete Lyndal Pulley, DO

## 2020-10-12 ENCOUNTER — Other Ambulatory Visit: Payer: Self-pay

## 2020-10-12 ENCOUNTER — Ambulatory Visit (INDEPENDENT_AMBULATORY_CARE_PROVIDER_SITE_OTHER): Payer: Medicare Other | Admitting: Family Medicine

## 2020-10-12 ENCOUNTER — Encounter: Payer: Self-pay | Admitting: Family Medicine

## 2020-10-12 ENCOUNTER — Ambulatory Visit: Payer: Medicare Other | Admitting: Internal Medicine

## 2020-10-12 DIAGNOSIS — M545 Low back pain, unspecified: Secondary | ICD-10-CM | POA: Diagnosis not present

## 2020-10-12 DIAGNOSIS — G8929 Other chronic pain: Secondary | ICD-10-CM | POA: Diagnosis not present

## 2020-10-12 NOTE — Patient Instructions (Signed)
Good to see you Glad you are doing well See me again in 3 months

## 2020-10-12 NOTE — Assessment & Plan Note (Signed)
Patient does have a low back pain but has made some progress at this moment.  Patient is not having any pain that is stopping her from activity.  Patient states that as long as she does take breaks from time to time she does well.  Patient is can continue to increase activity.  Follow-up with me again in 3 to 4 months

## 2020-10-25 ENCOUNTER — Telehealth: Payer: Self-pay | Admitting: Internal Medicine

## 2020-10-25 NOTE — Telephone Encounter (Signed)
Pt would like to know when she needs to schedule to get her next injection.  Last injection was 05/05/20.please advise

## 2020-10-26 NOTE — Telephone Encounter (Signed)
Last OV 03/10/20 Next OV not scheduled  Last Prolia inj 05/05/20 Next Prolia inj due 11/04/20

## 2020-10-26 NOTE — Telephone Encounter (Signed)
VOB initiated via [employer] portal.

## 2020-10-31 NOTE — Telephone Encounter (Signed)
MEDICAL BENEFIT SUMMARY Patient Out-of-Pocket Responsibility Coverage Available Authorization Required Deductible Co-pay/Coinsurance Prolia OOP COST PHYSICIAN FACILITY FEE ADMIN FEE PURCHASE OR REFERRAL: YES NO PRIMARY $200 Met SECONDARY No No* No* No* SPECIALTY PHARMACY (via Medical Benefit): YES NO No* No* N/A* *Reflects patient costs once plan deductible is met. Please see Medical Benefit Details for further information regarding patient costs. Patient costs may vary based on services rendered. BENEFITS VERIFIED FOR THE FOLLOWING DIAGNOSIS AND INSURANCE PLANS Verified for Diagnosis M81.0 Site of Care  MD Office   Policy Level: Primary Policy Status: Active Payer Name: [facility] Plan Name: [employer] MEDICARE ADVANTAGE ([place] Number: [id number] Employer Name: Plan Type: Group Number: [id number]  Payer Phone: [phone] PRIMARY MEDICAL BENEFIT DETAILS (PHYSICIAN PURCHASE, OR REFERRAL TO TREATING SITE) COVERAGE AVAILABLE: Yes COVERAGE DETAILS: Prolia and administration are subject to $200.00 deductible ($200.00 met). Once met, Prolia will then be covered at 100%. The benefits provided on this Verification of Benefits form are Medical Benefits and are the patient's In-Network benefits for Prolia. If you would like Pharmacy Benefits for Prolia, please call [phone].  AUTHORIZATION REQUIRED: No

## 2020-10-31 NOTE — Telephone Encounter (Signed)
Pt ready for scheduling on or after 11/04/2020.   Out-of-pocket cost due at time of visit: $0.00  Prolia co-insurance: 0% Admin fee co-insurance: 0%  Deductible: $200 of $200 met  NO Prior Auth required.

## 2020-11-02 ENCOUNTER — Ambulatory Visit: Payer: Medicare Other | Admitting: Internal Medicine

## 2020-11-09 NOTE — Telephone Encounter (Signed)
Pt scheduled for Friday 5/6 at 2pm

## 2020-11-11 ENCOUNTER — Encounter: Payer: Self-pay | Admitting: Internal Medicine

## 2020-11-11 ENCOUNTER — Ambulatory Visit: Payer: Medicare Other

## 2020-11-11 ENCOUNTER — Ambulatory Visit (INDEPENDENT_AMBULATORY_CARE_PROVIDER_SITE_OTHER): Payer: Medicare Other | Admitting: Internal Medicine

## 2020-11-11 ENCOUNTER — Other Ambulatory Visit: Payer: Self-pay

## 2020-11-11 VITALS — BP 116/72 | HR 80 | Ht 61.5 in | Wt 102.4 lb

## 2020-11-11 DIAGNOSIS — M810 Age-related osteoporosis without current pathological fracture: Secondary | ICD-10-CM

## 2020-11-11 LAB — BASIC METABOLIC PANEL
BUN: 17 mg/dL (ref 6–23)
CO2: 28 mEq/L (ref 19–32)
Calcium: 9.4 mg/dL (ref 8.4–10.5)
Chloride: 105 mEq/L (ref 96–112)
Creatinine, Ser: 1.05 mg/dL (ref 0.40–1.20)
GFR: 49.38 mL/min — ABNORMAL LOW (ref >60.00)
Glucose, Bld: 110 mg/dL — ABNORMAL HIGH (ref 70–99)
Potassium: 3.8 mEq/L (ref 3.5–5.1)
Sodium: 140 mEq/L (ref 135–145)

## 2020-11-11 LAB — VITAMIN D 25 HYDROXY (VIT D DEFICIENCY, FRACTURES): VITD: 87.67 ng/mL (ref 30.00–100.00)

## 2020-11-11 MED ORDER — DENOSUMAB 60 MG/ML ~~LOC~~ SOSY
60.0000 mg | PREFILLED_SYRINGE | Freq: Once | SUBCUTANEOUS | Status: AC
  Administered 2020-11-11: 60 mg via SUBCUTANEOUS

## 2020-11-11 NOTE — Patient Instructions (Signed)
-   Continue Calcium tablets twice daily  - Will will contact you about the vitamin D dose

## 2020-11-11 NOTE — Progress Notes (Signed)
Name: Cindy Meza  MRN/ DOB: [id number], [birth year]    Age/ Sex: 85 y.o., female     PCP: Cassandria Anger, MD   Reason for Endocrinology Evaluation: Osteoporosis     Initial Endocrinology Clinic Visit: 03/10/2020    PATIENT IDENTIFIER: Cindy Meza is a 85 y.o., female with a past medical history of OSTEOPOROSIS, aSTHMA AND cad. She has followed with [facility] since 03/10/2020 for consultative assistance with management of her osteoporosis.   HISTORICAL SUMMARY:   Pt was diagnosed with osteoporosis: 2018  Menarche at age : 40 Menopausal at age : in her 7's  Fracture Hx: Right hip fracture  Hx of HRT: yes  FH of osteoporosis or hip fracture: no Prior Hx of anti-estrogenic therapy : no  Prior Hx of anti-resorptive therapy : Intolerant to Fosamax - heel pain  Many years ago. Restarted ~ 2020 but stopped in 2021 due to abdominal pain.   First Prolia injection 05/05/2020   SUBJECTIVE:    Today (11/11/2020):  Cindy Meza is here for osteoporosis .  She has tolerated her last prolia injection without side effects   She tells me she is taking Vitamin D 4x a day based on cardiology recommendations ?  She has been "bad"  Constipation has improved      Calcium 1 tabs BID Vitamin D3 1000 iu daily - taking 4x a day  Prolia 60 mg [place] Q 6 months       HISTORY:  Past Medical History:  Past Medical History:  Diagnosis Date  . Allergy   . Anemia   . Asthma    Dr. Melvyn Novas  . Blood transfusion without reported diagnosis   . Breast cyst    Left  . Cataract    Dr. Gershon Crane  . Complication of anesthesia    ' I HAD HALLUCINATIONS WHEN THEY DID MY HIP "  . GERD (gastroesophageal reflux disease)   . Heart murmur   . Hemorrhoids   . History of colonic polyps [name]   hx of adenomas (small) 1998  . OA (osteoarthritis)   . Orthostatic hypotension   . Osteopenia   . PAC (premature atrial contraction)   . Vitamin D deficiency    Past  Surgical History:  Past Surgical History:  Procedure Laterality Date  . ABDOMINAL HYSTERECTOMY     complete  . APPENDECTOMY    . BREAST CYST EXCISION    . CATARACT EXTRACTION, BILATERAL     04/23/18 left eye, 04/02/18 right eye  . COLONOSCOPY  Multiple   Adenomatous colon polyps  . ESOPHAGOGASTRODUODENOSCOPY  2010  . INTRAMEDULLARY (IM) NAIL INTERTROCHANTERIC Right 06/13/2016   Procedure: INTRAMEDULLARY (IM) NAIL INTERTROCHANTRIC;  Surgeon: Tania Ade, MD;  Location: [facility];  Service: Orthopedics;  Laterality: Right;  . TONSILLECTOMY      Social History:  reports that she has never smoked. She has never used smokeless tobacco. She reports current alcohol use. She reports that she does not use drugs. Family History:  Family History  Problem Relation Age of Onset  . Diabetes Mother 38       Deceased  . Hypertension Other   . Colon cancer Neg Hx   . Pancreatic cancer Neg Hx   . Stomach cancer Neg Hx   . Esophageal cancer Neg Hx   . Rectal cancer Neg Hx      HOME MEDICATIONS: Allergies as of 11/11/2020      Reactions   Fosamax [alendronate Sodium]  Paresthesias, abd pain   Gabapentin    dizziness   Lidocaine Swelling   Of face and lips.  Novocaine does same thing.   Midodrine    Nausea   Risedronate Sodium    Knee pain   Shellfish Allergy Hives   Statins    REACTION: pains      Medication List       Accurate as of Nov 11, 2020  4:01 PM. If you have any questions, ask your nurse or doctor.        Advair HFA 115-21 MCG/ACT inhaler Generic drug: fluticasone-salmeterol INHALE 2 PUFFS INTO THE LUNGS TWICE DAILY   aspirin 81 MG tablet Take 81 mg by mouth daily.   CALCIUM 500 PO Take 2 each by mouth daily.   Cholecalciferol 100 MCG (4000 UT) Tabs Take 4,000 Units by mouth daily.   CVS Omega-3 Krill Oil 500 MG Caps Take 500 capsules by mouth daily.   famotidine 40 MG tablet Commonly known as: PEPCID Take 40 mg by mouth at bedtime.   fludrocortisone  0.1 MG tablet Commonly known as: FLORINEF Take 0.5 tablets (0.05 mg total) by mouth 2 (two) times daily. AM and 2PM   fluticasone 50 MCG/ACT nasal spray Commonly known as: FLONASE Place 2 sprays into both nostrils daily.   loratadine 10 MG tablet Commonly known as: CLARITIN Take 10 mg by mouth daily.   nystatin-triamcinolone ointment Commonly known as: MYCOLOG Apply topically 2 (two) times daily as needed.   polyethylene glycol 17 g packet Commonly known as: MIRALAX / GLYCOLAX Take 17 g by mouth daily.   Vitamin B-12 1000 MCG Subl Place 1 tablet (1,000 mcg total) under the tongue daily.         OBJECTIVE:   PHYSICAL EXAM: VS: BP 116/72   Pulse 80   Ht 5' 1.5" (1.562 m)   Wt 102 lb 6 oz (46.4 kg)   SpO2 98%   BMI 19.03 kg/m    EXAM: General: Pt appears well and is in NAD  Lungs: Clear with good BS bilat with no rales, rhonchi, or wheezes  Heart: Auscultation: RRR.  Abdomen: Normoactive bowel sounds, soft, nontender, without masses or organomegaly palpable  Extremities:  BL LE: No pretibial edema normal ROM and strength.  Mental Status: Judgment, insight: Intact Orientation: Oriented to time, place, and person Mood and affect: No depression, anxiety, or agitation     DATA REVIEWED:   Results for TACARRA, JUSTO (MRN [id number]) as of 11/14/2020 10:42  Ref. Range 11/11/2020 11:51  Sodium Latest Ref Range: 135 - 145 mEq/L 140  Potassium Latest Ref Range: 3.5 - 5.1 mEq/L 3.8  Chloride Latest Ref Range: 96 - 112 mEq/L 105  CO2 Latest Ref Range: 19 - 32 mEq/L 28  Glucose Latest Ref Range: 70 - 99 mg/dL 110 (H)  BUN Latest Ref Range: 6 - 23 mg/dL 17  Creatinine Latest Ref Range: 0.40 - 1.20 mg/dL 1.05  Calcium Latest Ref Range: 8.4 - 10.5 mg/dL 9.4  GFR Latest Ref Range: >60.00 mL/min 49.38 (L)  VITD Latest Ref Range: 30.00 - 100.00 ng/mL 87.67     ASSESSMENT / PLAN / RECOMMENDATIONS:   1. Osteoporosis :  - Started on prolia 04/2020 -She has  tolerated the initial injection without any side effects she received her second dose of Prolia today. -She has been taking vitamin D up to 4 times a day per patient this is based on cardiology recommendations? Repeat labs shows stable GFR and upper normal  of Vitamin D     Medications  Continue calcium 500 mg twice daily Decrease Vitamin D to 1000 iu daily  Prolia 60 mg SQ every 6 months  Follow-up in 1 year  Signed electronically by: Mack Guise, MD  [facility]  [facility] [address], [place] [address] Phone: [phone] FAX: [phone]      CC: Cassandria Anger, MD [place] [address] Phone: [phone]  Fax: [phone]   Return to Endocrinology clinic as below: Future Appointments  Date Time Provider [place]  11/15/2020 10:00 AM [name], MD [facility]  01/03/2021  2:20 PM [name], Evie Lacks, MD [facility] None  01/11/2021  2:30 PM Lyndal Pulley, DO [facility] None  11/15/2021 11:10 AM Astou Lada, Melanie Crazier, MD [facility] None

## 2020-11-13 NOTE — Progress Notes (Signed)
Cardiology Office Note   Date:  11/15/2020   ID:  Cindy Meza, DOB August 09, 1935, MRN [id number]  PCP:  Cindy Anger, MD  Cardiologist:   Cindy Carnes, MD   Pt presents for f/u of orthostatic hypotension    History of Present Illness: Cindy Meza is a 85 y.o. female with a history  fatigue and orthostatic intolerance.  ALso a hx of mitral regurgitation, asthma, GERD   Myovue in 2017 and 2019 were normal   Echo with LVEF 50 to 55% with mild diastolic dysfunction and mod MR    SHe has been taking Crestor 1x per week   Cut back due to consitpation  I saw the pt in clinic in early Dec 2021  She called a couple weeks later complaining of shakiness, lightheadnedness   REcomm increasing salt and increasing florinef to bid  She called back in Feb complaining of feeling bad again   Wanted to go back to 1/2 tab daily  ALso cut out Crestor  Then back up to 2x daily florinef I also recomm spanx  The patient has now cut back on florinef to daily    She says she has been feeling better for about 2 wks since doing it    Says she is drinking about 40 oz per day     Outpatient Medications Prior to Visit  Medication Sig Dispense Refill  . ADVAIR HFA 115-21 MCG/ACT inhaler INHALE 2 PUFFS INTO THE LUNGS TWICE DAILY 12 g 3  . aspirin 81 MG tablet Take 81 mg by mouth daily.    . Calcium-Magnesium-Vitamin D (CALCIUM 500 PO) Take 2 each by mouth daily.    . Cholecalciferol 100 MCG (4000 UT) TABS Take 4,000 Units by mouth daily. 30 tablet   . CVS OMEGA-3 KRILL OIL 500 MG CAPS Take 500 capsules by mouth daily.    . Cyanocobalamin (VITAMIN B-12) 1000 MCG SUBL Place 1 tablet (1,000 mcg total) under the tongue daily. 100 tablet 3  . famotidine (PEPCID) 40 MG tablet Take 40 mg by mouth at bedtime.    . fludrocortisone (FLORINEF) 0.1 MG tablet Take 0.5 tablets (0.05 mg total) by mouth 2 (two) times daily. AM and 2PM 45 tablet 3  . fluticasone (FLONASE) 50 MCG/ACT nasal spray Place 2 sprays  into both nostrils daily. 16 g 6  . loratadine (CLARITIN) 10 MG tablet Take 10 mg by mouth daily.    Marland Kitchen nystatin-triamcinolone ointment (MYCOLOG) Apply topically 2 (two) times daily as needed.    . polyethylene glycol (MIRALAX / GLYCOLAX) 17 g packet Take 17 g by mouth daily.     No facility-administered medications prior to visit.     Allergies:   Fosamax [alendronate sodium], Gabapentin, Lidocaine, Midodrine, Risedronate sodium, Shellfish allergy, and Statins   Past Medical History:  Diagnosis Date  . Allergy   . Anemia   . Asthma    Dr. Melvyn Novas  . Blood transfusion without reported diagnosis   . Breast cyst    Left  . Cataract    Dr. Gershon Crane  . Complication of anesthesia    ' I HAD HALLUCINATIONS WHEN THEY DID MY HIP "  . GERD (gastroesophageal reflux disease)   . Heart murmur   . Hemorrhoids   . History of colonic polyps Cindy Meza   hx of adenomas (small) 1998  . OA (osteoarthritis)   . Orthostatic hypotension   . Osteopenia   . PAC (premature atrial contraction)   . Vitamin D deficiency  Past Surgical History:  Procedure Laterality Date  . ABDOMINAL HYSTERECTOMY     complete  . APPENDECTOMY    . BREAST CYST EXCISION    . CATARACT EXTRACTION, BILATERAL     04/23/18 left eye, 04/02/18 right eye  . COLONOSCOPY  Multiple   Adenomatous colon polyps  . ESOPHAGOGASTRODUODENOSCOPY  2010  . INTRAMEDULLARY (IM) NAIL INTERTROCHANTERIC Right 06/13/2016   Procedure: INTRAMEDULLARY (IM) NAIL INTERTROCHANTRIC;  Surgeon: Tania Ade, MD;  Location: [facility];  Service: Orthopedics;  Laterality: Right;  . TONSILLECTOMY       Social History:  The patient  reports that she has never smoked. She has never used smokeless tobacco. She reports current alcohol use. She reports that she does not use drugs.   Family History:  The patient's family history includes Diabetes (age of onset: 41) in her mother; Hypertension in an other family member.  No history of CAD known    Mother had PPM     ROS:  Please see the history of present illness. All other systems are reviewed and  Negative to the above problem except as noted.    PHYSICAL EXAM: VS:  BP 122/72   Pulse 89   Ht 5\' 2"  (1.575 m)   Wt 105 lb (47.6 kg)   SpO2 99%   BMI 19.20 kg/m     GEN: Thin 85 year old in no acute distress  HEENT: normal  Neck: JVP is normal  No carotid bruits Cardiac: RRR; no murmurs.  No LE  edema  Respiratory:  clear to auscultation   GI: soft, nontender, nondistended, + BS  No hepatomegaly  MS: no deformity Moving all extremities   Skin: warm and dry, no rash Neuro:  Strength and sensation are intact Psych: euthymic mood, full affect   EKG:  EKG is not ordered.  Lipid Panel    Component Value Date/Time   CHOL 208 (H) 02/26/2020 0904   TRIG 50 02/26/2020 0904   HDL 110 02/26/2020 0904   CHOLHDL 1.9 02/26/2020 0904   CHOLHDL 2 04/10/2018 1207   VLDL 13.6 04/10/2018 1207   LDLCALC 89 02/26/2020 0904   LDLDIRECT 104.0 03/17/2013 1026      Wt Readings from Last 3 Encounters:  11/15/20 105 lb (47.6 kg)  11/11/20 102 lb 6 oz (46.4 kg)  10/12/20 106 lb (48.1 kg)      ASSESSMENT AND PLAN:  1  Orthostatic hypotension  Difficult  She has been up and down all spring   She is  doing better for the past 2 wks    Keep on florinef daily    Stay hydratided   2  CAD Plaquing of aorta and coronary arteries  Normal myovue in Dec 2019 Continues to have no symptoms    On ASA   Check CBC    3 Hyperlipidemia\ Pt not on a statin   Had been achy   Will get lipids      Plan for f/u in Dec   Current medicines are reviewed at length with the patient today.  The patient does not have concerns regarding medicines.   Signed, Cindy Carnes, MD  11/15/2020 10:49 AM    [facility] [address], [place],   [address] Phone: [phone]; Fax: [phone]

## 2020-11-14 ENCOUNTER — Encounter: Payer: Self-pay | Admitting: Internal Medicine

## 2020-11-15 ENCOUNTER — Encounter: Payer: Self-pay | Admitting: Internal Medicine

## 2020-11-15 ENCOUNTER — Ambulatory Visit (INDEPENDENT_AMBULATORY_CARE_PROVIDER_SITE_OTHER): Payer: Medicare Other | Admitting: Internal Medicine

## 2020-11-15 ENCOUNTER — Other Ambulatory Visit: Payer: Self-pay

## 2020-11-15 VITALS — BP 122/72 | HR 89 | Ht 62.0 in | Wt 105.0 lb

## 2020-11-15 DIAGNOSIS — E785 Hyperlipidemia, unspecified: Secondary | ICD-10-CM | POA: Diagnosis not present

## 2020-11-15 DIAGNOSIS — I2510 Atherosclerotic heart disease of native coronary artery without angina pectoris: Secondary | ICD-10-CM | POA: Diagnosis not present

## 2020-11-15 MED ORDER — FLUDROCORTISONE ACETATE 0.1 MG PO TABS: 0.0500 mg | ORAL_TABLET | Freq: Every day | ORAL | 3 refills | Status: DC

## 2020-11-15 NOTE — Patient Instructions (Signed)
Medication Instructions:  No changes - continue to take florinef 0.05 mg DAILY  *If you need a refill on your cardiac medications before your next appointment, please call your pharmacy*   Lab Work: LIPIDS   Testing/Procedures: none   Follow-Up: At [facility], you and your health needs are our priority.  As part of our continuing mission to provide you with exceptional heart care, we have created designated Provider Care Teams.  These Care Teams include your primary Cardiologist (physician) and Advanced Practice Providers (APPs -  Physician Assistants and Nurse Practitioners) who all work together to provide you with the care you need, when you need it.  We recommend signing up for the patient portal called "MyChart".  Sign up information is provided on this After Visit Summary.  MyChart is used to connect with patients for Virtual Visits (Telemedicine).  Patients are able to view lab/test results, encounter notes, upcoming appointments, etc.  Non-urgent messages can be sent to your provider as well.   To learn more about what you can do with MyChart, go to [email or web address].    Your next appointment:   December 2022.  The format for your next appointment:   In Person  Provider:   You may see Dorris Carnes, MD or one of the following Advanced Practice Providers on your designated Care Team:    Richardson Dopp, PA-C  [place], [place]

## 2020-11-16 LAB — LIPID PANEL
Chol/HDL Ratio: 2.3 ratio (ref 0.0–4.4)
Cholesterol, Total: 246 mg/dL — ABNORMAL HIGH (ref 100–199)
HDL: 109 mg/dL (ref >39)
LDL Chol Calc (NIH): 129 mg/dL — ABNORMAL HIGH (ref 0–99)
Triglycerides: 49 mg/dL (ref 0–149)
VLDL Cholesterol Cal: 8 mg/dL (ref 5–40)

## 2020-11-16 LAB — CBC
Hematocrit: 33.4 % — ABNORMAL LOW (ref 34.0–46.6)
Hemoglobin: 10.9 g/dL — ABNORMAL LOW (ref 11.1–15.9)
MCH: 30.8 pg (ref 26.6–33.0)
MCHC: 32.6 g/dL (ref 31.5–35.7)
MCV: 94 fL (ref 79–97)
Platelets: 233 10*3/uL (ref 150–450)
RBC: 3.54 x10E6/uL — ABNORMAL LOW (ref 3.77–5.28)
RDW: 11.3 % — ABNORMAL LOW (ref 11.7–15.4)
WBC: 5.9 10*3/uL (ref 3.4–10.8)

## 2020-11-22 ENCOUNTER — Telehealth: Payer: Self-pay | Admitting: *Deleted

## 2020-11-22 DIAGNOSIS — E785 Hyperlipidemia, unspecified: Secondary | ICD-10-CM

## 2020-11-22 NOTE — Telephone Encounter (Signed)
-----   Message from [name], MD sent at 11/16/2020  9:47 PM EDT ----- LDL is too high   Has CAD on CT Has not tolearted statins   REfer for evaluation of Repatha Hgb is minimally decreased   Not bad   Follow

## 2020-11-22 NOTE — Telephone Encounter (Signed)
Referral placed for lipid clinic.  Pt reports having problems with florinef.  It is giving her indigestion and pepcid is not helping.  She is taking  it daily.    Her vit D was rechecked by endocrinology and it was recommended that she decrease vit D back to 1000 units daily.  She wanted to know what to do since Dr. Harrington Challenger increased her vit d from 1000 to 4000.  I adv to follow what the ordering doctor recommends.  Her most recent vit d was 95.

## 2020-11-25 ENCOUNTER — Encounter: Payer: Self-pay | Admitting: Internal Medicine

## 2020-11-25 ENCOUNTER — Ambulatory Visit (INDEPENDENT_AMBULATORY_CARE_PROVIDER_SITE_OTHER): Payer: Medicare Other | Admitting: Internal Medicine

## 2020-11-25 VITALS — BP 128/78 | HR 76 | Ht 62.0 in | Wt 103.1 lb

## 2020-11-25 DIAGNOSIS — R634 Abnormal weight loss: Secondary | ICD-10-CM

## 2020-11-25 DIAGNOSIS — R1033 Periumbilical pain: Secondary | ICD-10-CM

## 2020-11-25 MED ORDER — OMEPRAZOLE 40 MG PO CPDR: 40.0000 mg | DELAYED_RELEASE_CAPSULE | Freq: Every day | ORAL | 1 refills | Status: DC

## 2020-11-25 NOTE — Progress Notes (Signed)
Cindy Meza 84 y.o. 03-27-1936 [id number]  Assessment & Plan:   Encounter Diagnoses  Name Primary?  . Periumbilical abdominal pain Yes  . Loss of weight - mild     We talked about whether to proceed with an EGD, we have decided to try 40 mg omeprazole in the mornings and I will see her back in 2 or 3 months.  If she continues to lose weight we can do an EGD, she had a CT that was negative last year for similar issues and overall I think her weight has fluctuated up and down more than there is been a trend when I look at it.  She thinks Florinef is causing some side effects as well.  I did not formally test memory today but she seems to be struggling to remember some things follow-up on this upon return question memory disturbance  CC: [name], Evie Lacks, MD   Subjective:   Chief Complaint: Abdominal pain nausea heartburn weight loss  HPI 84 year old woman with a history of GERD and abdominal pain issues, last seen March 2021 at which point I thought Fosamax was causing some periumbilical abdominal pain.  Presents today with a 2-week history of intermittent sharp abdominal pain nausea without vomiting on Pepcid for heartburn as well.  Last EGD was in 2009 which was normal with an empiric Maloney dilation.  CT scan abdomen and pelvis with contrast March 2021 negative.  No clear triggers - not nocturnal - has heartburn despite Pepcid that is usually in the morning.  She still have some periumbilical abdominal pain that seems very fleeting.  Some nausea at times as well.  She was supposed to get a prescription for Megace but I do not think that was filled it looks like it was supposed to be a printed prescription from Dr. Alain Marion and she is wondering if she should get that.  I told her to talk to him.  She does seem to have a good appetite her weight is down a little bit as below.  Really she says she has a good appetite I do not get a history of significant early satiety.  She  has had chronic issues like this off and on over the years.   Wt Readings from Last 3 Encounters:  11/25/20 103 lb 2 oz (46.8 kg)  11/15/20 105 lb (47.6 kg)  11/11/20 102 lb 6 oz (46.4 kg)  11/2019 107#  Allergies  Allergen Reactions  . Fosamax [Alendronate Sodium]     Paresthesias, abd pain  . Gabapentin     dizziness  . Lidocaine Swelling    Of face and lips.  Novocaine does same thing.  . Midodrine     Nausea   . Risedronate Sodium     Knee pain  . Shellfish Allergy Hives  . Statins     REACTION: pains   Current Meds  Medication Sig  . ADVAIR HFA 115-21 MCG/ACT inhaler INHALE 2 PUFFS INTO THE LUNGS TWICE DAILY  . aspirin 81 MG tablet Take 81 mg by mouth daily.  . Calcium-Magnesium-Vitamin D (CALCIUM 500 PO) Take 2 each by mouth daily.  . cholecalciferol (VITAMIN D3) 25 MCG (1000 UNIT) tablet Take 1,000 Units by mouth daily.  . [employer]S OMEGA-3 KRILL OIL 500 MG CAPS Take 500 capsules by mouth daily.  . Cyanocobalamin (VITAMIN B-12) 1000 MCG SUBL Place 1 tablet (1,000 mcg total) under the tongue daily.  . famotidine (PEPCID) 40 MG tablet Take 40 mg by mouth at bedtime.  Marland Kitchen  fludrocortisone (FLORINEF) 0.1 MG tablet Take 0.5 tablets (0.05 mg total) by mouth daily.  . fluticasone (FLONASE) 50 MCG/ACT nasal spray Place 2 sprays into both nostrils daily. (Patient taking differently: Place 2 sprays into both nostrils as needed.)  . loratadine (CLARITIN) 10 MG tablet Take 10 mg by mouth daily.  Marland Kitchen nystatin-triamcinolone ointment (MYCOLOG) Apply topically 2 (two) times daily as needed.  . polyethylene glycol (MIRALAX / GLYCOLAX) 17 g packet Take 17 g by mouth daily as needed.   Past Medical History:  Diagnosis Date  . Allergy   . Anemia   . Asthma    Dr. Melvyn Novas  . Blood transfusion without reported diagnosis   . Breast cyst    Left  . Cataract    Dr. Gershon Crane  . Complication of anesthesia    ' I HAD HALLUCINATIONS WHEN THEY DID MY HIP "  . GERD (gastroesophageal reflux disease)    . Heart murmur   . Hemorrhoids   . History of colonic polyps [name]   hx of adenomas (small) 1998  . OA (osteoarthritis)   . Orthostatic hypotension   . Osteopenia   . PAC (premature atrial contraction)   . Vitamin D deficiency    Past Surgical History:  Procedure Laterality Date  . ABDOMINAL HYSTERECTOMY     complete  . APPENDECTOMY    . BREAST CYST EXCISION    . CATARACT EXTRACTION, BILATERAL     04/23/18 left eye, 04/02/18 right eye  . COLONOSCOPY  Multiple   Adenomatous colon polyps  . ESOPHAGOGASTRODUODENOSCOPY  2010  . INTRAMEDULLARY (IM) NAIL INTERTROCHANTERIC Right 06/13/2016   Procedure: INTRAMEDULLARY (IM) NAIL INTERTROCHANTRIC;  Surgeon: Tania Ade, MD;  Location: [facility];  Service: Orthopedics;  Laterality: Right;  . TONSILLECTOMY     Social History   Social History Narrative   She lives alone in a [place] home.  No children.   Retired Education officer, [employer].   Highest level of education:  Masters degree   family history includes Diabetes (age of onset: 22) in her mother; Hypertension in an other family member.   Review of Systems As above Objective:   Physical Exam BP 128/78   Pulse 76   Ht 5\' 2"  (1.575 m)   Wt 103 lb 2 oz (46.8 kg)   SpO2 98%   BMI 18.86 kg/m  Well-developed well-nourished petite thin black woman elderly Abdomen is soft nontender no organomegaly or mass no bruits

## 2020-11-27 NOTE — Telephone Encounter (Signed)
Pt received Prolia inj 11/11/20 Next inj due 05/15/21

## 2020-12-08 IMAGING — CT CT HEAD W/O CM
3 of 4 series · 16 of 47 positions shown, 19 images · non-contrast
Comparison: 09/30/2015 head CT.

CLINICAL DATA: 82-year-old female with dizziness and
lightheadedness for couple months. No known injury. Initial
encounter.

EXAM:
CT HEAD WITHOUT CONTRAST
TECHNIQUE: Contiguous axial images were obtained from the base of the skull
through the vertex without intravenous contrast.

[Series 2: head 5.00 hr40 s3 ibhc · axial · 0.41mm/px · z∈[-577,-452]mm · 10 of 31 slices shown, 13 images]
[im 3/31  brain]
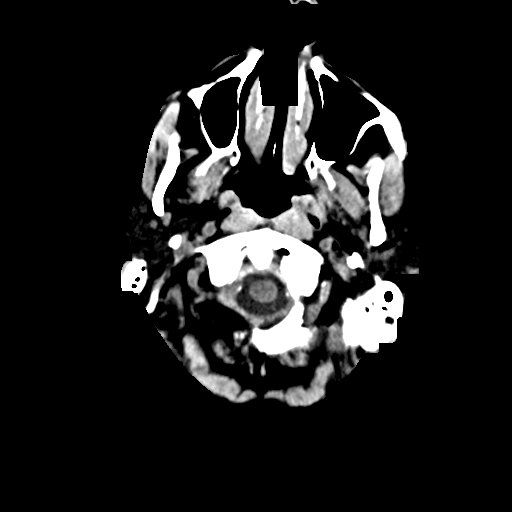
[im 3/31  bone]
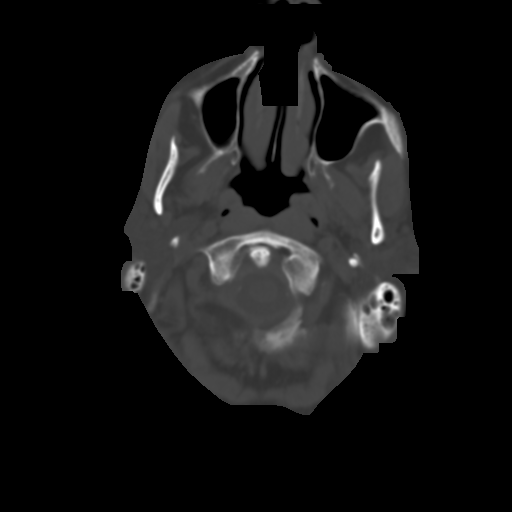
[im 5/31  brain]
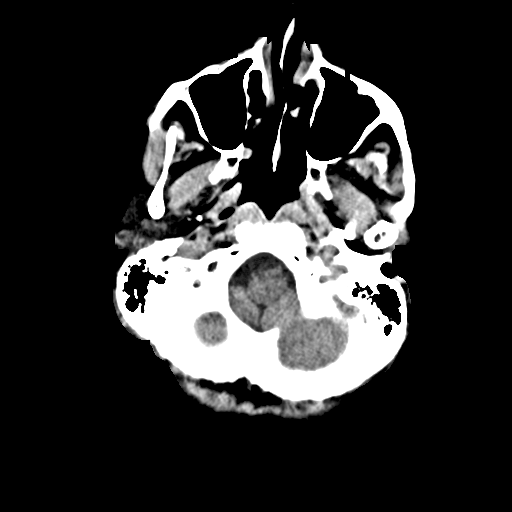
[im 9/31  brain]
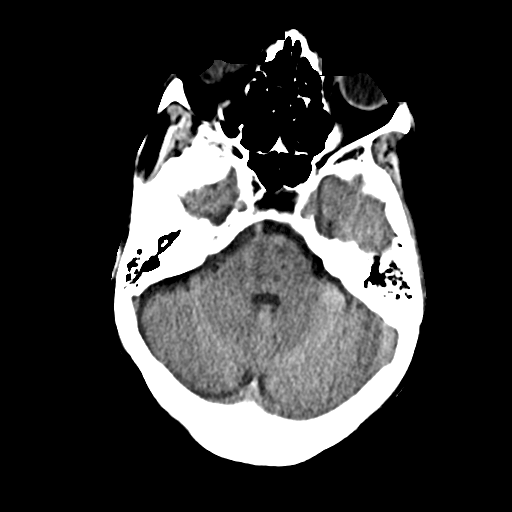
[im 11/31  brain]
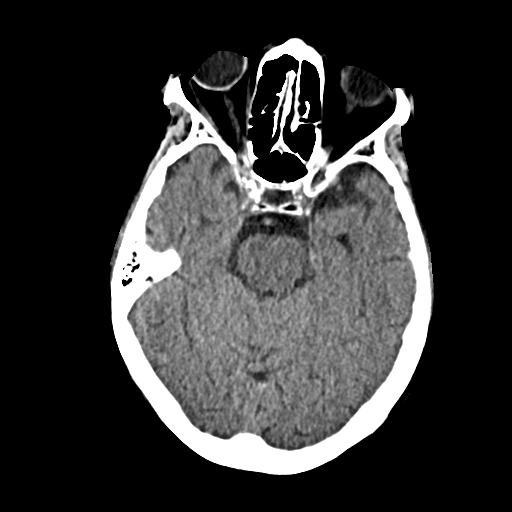
[im 13/31  brain]
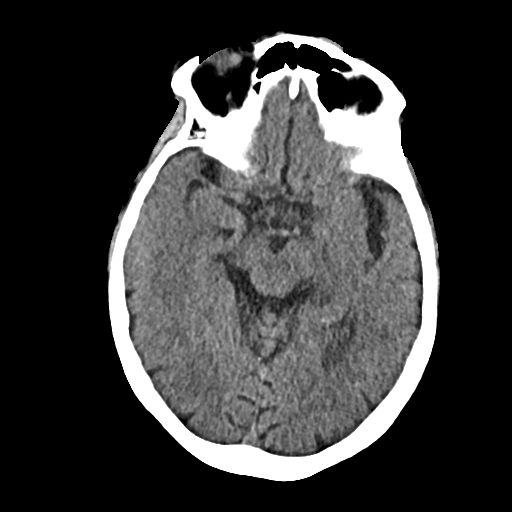
[im 13/31  bone]
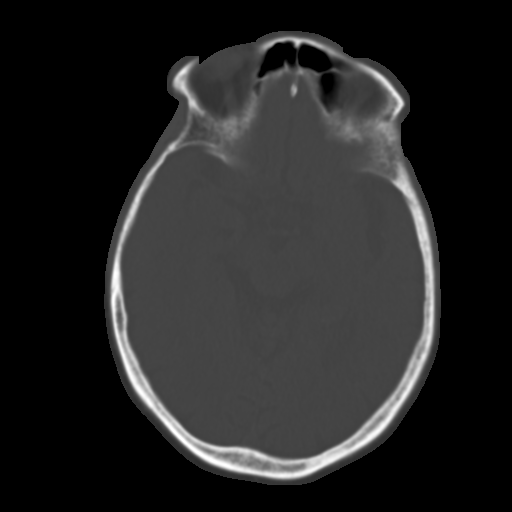
[im 18/31  brain]
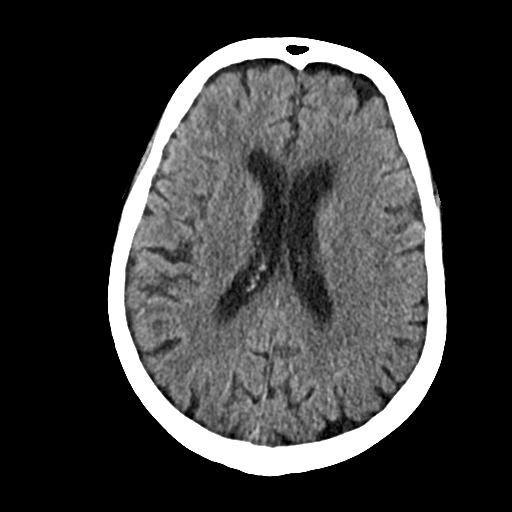
[im 20/31  brain]
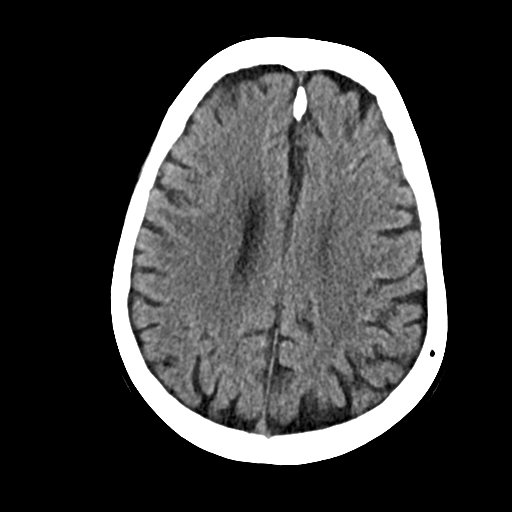
[im 22/31  brain]
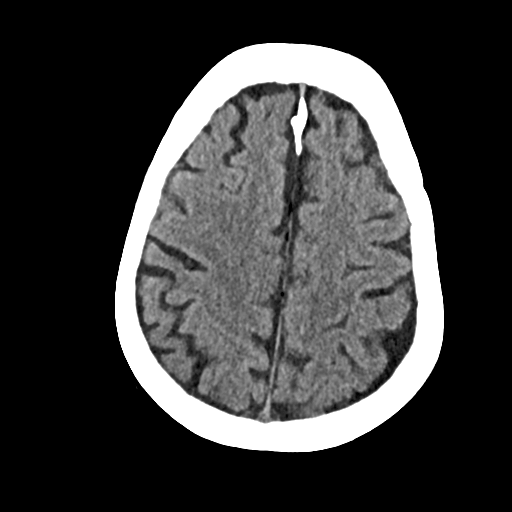
[im 26/31  brain]
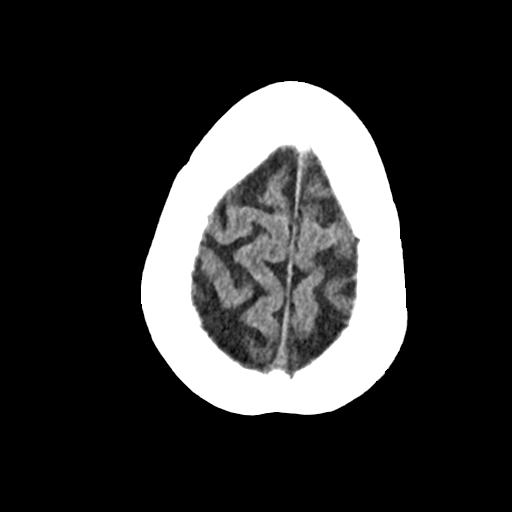
[im 26/31  bone]
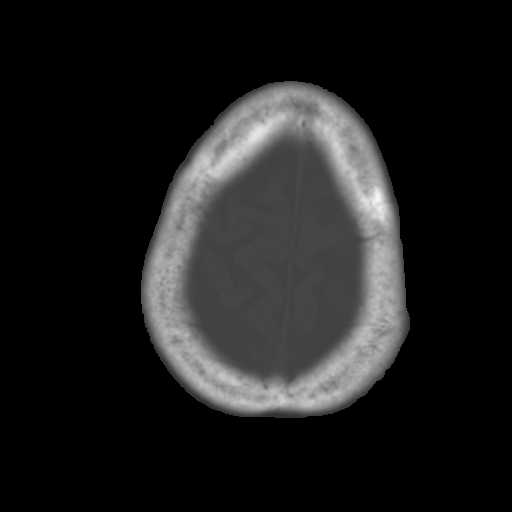
[im 28/31  brain]
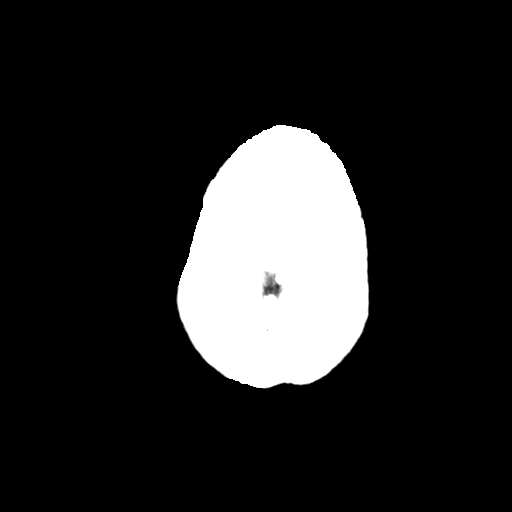

[Series 4: head 3.00 hr40 s3 sag · sagittal · 0.30mm/px · 3 of 51 slices shown]
[im 17/51  brain]
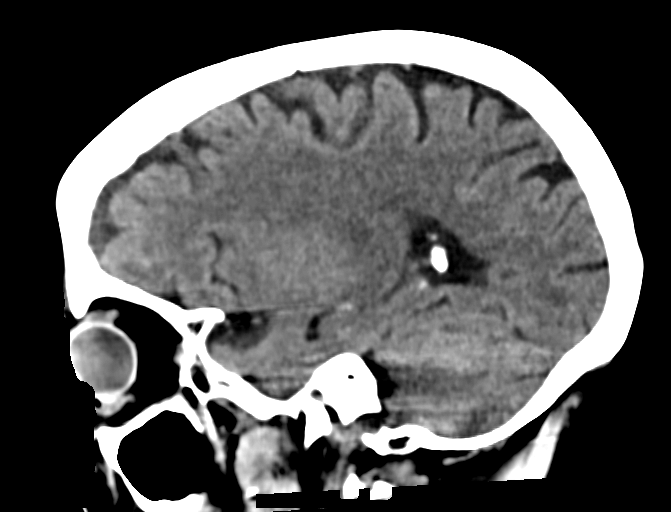
[im 26/51  brain]
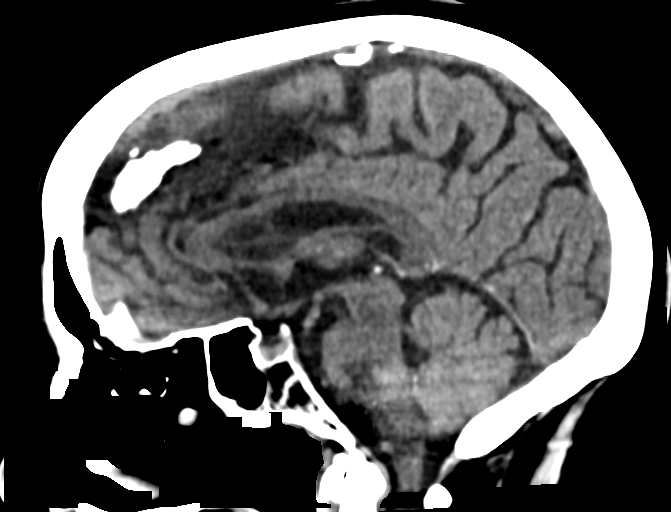
[im 34/51  brain]
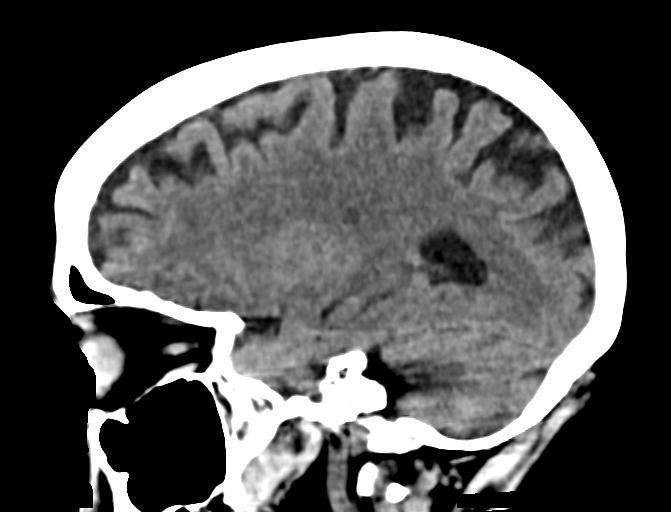

[Series 6: head 3.00 hr40 s3 cor · coronal · 0.30mm/px · 3 of 66 slices shown]
[im 22/66  brain]
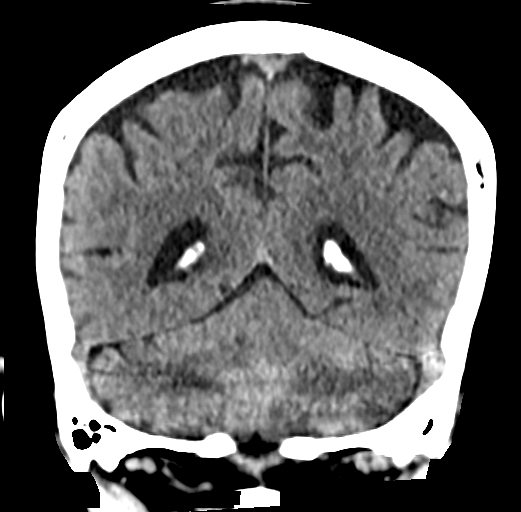
[im 29/66  brain]
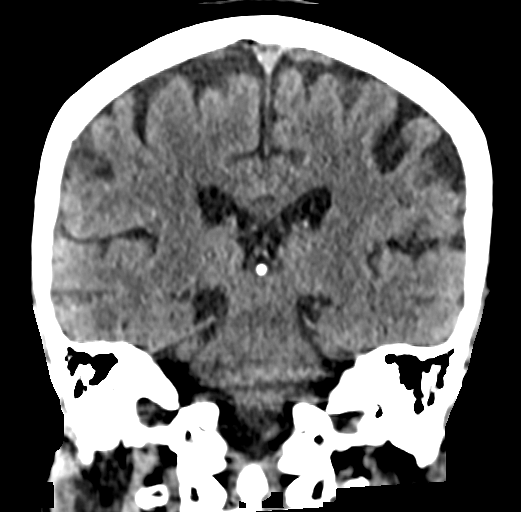
[im 37/66  brain]
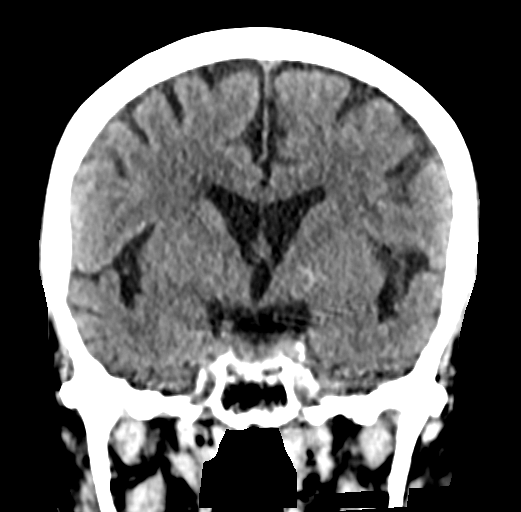

[16 of 47 positions shown; findings below may reference images not displayed]

FINDINGS: Brain: No intracranial hemorrhage or CT evidence of large acute
infarct. Mild chronic microvascular changes. Age-related mild global
atrophy. No intracranial mass lesion noted on this unenhanced exam.

Vascular: Vascular calcifications.  No acute hyperdense vessel.

Skull: No acute abnormality.

Sinuses/Orbits: Post lens replacement. No acute orbital abnormality.
Visualized paranasal sinuses are clear.

Other: Mastoid air cells and middle ear cavity are clear.
IMPRESSION: No acute intracranial abnormality noted.  Please see above.

## 2020-12-22 ENCOUNTER — Other Ambulatory Visit: Payer: Self-pay

## 2020-12-22 ENCOUNTER — Ambulatory Visit (INDEPENDENT_AMBULATORY_CARE_PROVIDER_SITE_OTHER): Payer: Medicare Other | Admitting: Pharmacist

## 2020-12-22 DIAGNOSIS — I2510 Atherosclerotic heart disease of native coronary artery without angina pectoris: Secondary | ICD-10-CM | POA: Diagnosis not present

## 2020-12-22 NOTE — Patient Instructions (Signed)
It was nice meeting you today!  We would like your LDL (bad cholesterol) to be less than 70  Continue your healthy eating.  Concentrate on eating vegetables, lean proteins, and whole grains  I would recommend starting Nexlizet which is a pill you would take once daily  If you would like to try it, I will do a prior authorization for you and call you when it is approved  Please call with any questions!  Karren Cobble, PharmD, BCACP, [place], [place] [address], [place], [place] [address] Phone: [phone]; Fax: [phone] 12/22/2020 2:03 PM

## 2020-12-22 NOTE — Progress Notes (Signed)
Patient ID: Cindy Meza                 DOB: 1935/09/11                    MRN: [id number]     HPI: Cindy Meza is a 85 y.o. female patient referred to lipid clinic by Dr Harrington Challenger. PMH is significant for hypotensaion, CAD, and asthma.   Patient presents today in good spirits.  Is worried that she has lost weight however she eats three meals a day.  Breakfast is cereal, fruit, or oatmeal. Lunch is a sandwich with lettuce and tomato and fruit.  Dinner is typically a protein and a vegetable or potatoes.  Drinks only water.  Has trialed multiple statins in the past, however only have Crestor on records.  Was previously on rosuvastatin 5mg  daily, which was then reduced to 5mg  one day a week.  Had myalgias on all regimens.  Her shoulders hurt like she was using hedge clippers.  She completely discontinued Crestor on 09/21/20.    Current Medications: n/a Intolerances: rosuvastatin 5 mg Risk Factors: CAD LDL goal: <70  Labs: TC 246, HDL 109, Trigs 49, LDL 129 (11/15/20 not on any meds)  Past Medical History:  Diagnosis Date   Allergy    Anemia    Asthma    Dr. Melvyn Novas   Blood transfusion without reported diagnosis    Breast cyst    Left   Cataract    Dr. Gershon Crane   Complication of anesthesia    ' I HAD HALLUCINATIONS WHEN THEY DID MY HIP "   GERD (gastroesophageal reflux disease)    Heart murmur    Hemorrhoids    History of colonic polyps Gessner   hx of adenomas (small) 1998   OA (osteoarthritis)    Orthostatic hypotension    Osteopenia    PAC (premature atrial contraction)    Vitamin D deficiency     Current Outpatient Medications on File Prior to Visit  Medication Sig Dispense Refill   ADVAIR HFA 115-21 MCG/ACT inhaler INHALE 2 PUFFS INTO THE LUNGS TWICE DAILY 12 g 3   aspirin 81 MG tablet Take 81 mg by mouth daily.     Calcium-Magnesium-Vitamin D (CALCIUM 500 PO) Take 2 each by mouth daily.     cholecalciferol (VITAMIN D3) 25 MCG (1000 UNIT) tablet Take 1,000 Units by  mouth daily.     CVS OMEGA-3 KRILL OIL 500 MG CAPS Take 500 capsules by mouth daily.     Cyanocobalamin (VITAMIN B-12) 1000 MCG SUBL Place 1 tablet (1,000 mcg total) under the tongue daily. 100 tablet 3   famotidine (PEPCID) 40 MG tablet Take 40 mg by mouth at bedtime.     fludrocortisone (FLORINEF) 0.1 MG tablet Take 0.5 tablets (0.05 mg total) by mouth daily. 45 tablet 3   fluticasone (FLONASE) 50 MCG/ACT nasal spray Place 2 sprays into both nostrils daily. (Patient taking differently: Place 2 sprays into both nostrils as needed.) 16 g 6   loratadine (CLARITIN) 10 MG tablet Take 10 mg by mouth daily.     nystatin-triamcinolone ointment (MYCOLOG) Apply topically 2 (two) times daily as needed.     omeprazole (PRILOSEC) 40 MG capsule Take 1 capsule (40 mg total) by mouth daily before breakfast. 90 capsule 1   polyethylene glycol (MIRALAX / GLYCOLAX) 17 g packet Take 17 g by mouth daily as needed.     No current facility-administered medications on file prior to  visit.    Allergies  Allergen Reactions   Fosamax [Alendronate Sodium]     Paresthesias, abd pain   Gabapentin     dizziness   Lidocaine Swelling    Of face and lips.  Novocaine does same thing.   Midodrine     Nausea    Risedronate Sodium     Knee pain   Shellfish Allergy Hives   Statins     REACTION: pains    Assessment/Plan:  1. Hyperlipidemia - Patient LDL 129  which is above goal of <70.  Ten months ago LDL was 89, however that was when she was rosuvastatin although not sure how often she was taking it at the time.  Discussed importance of reducing LDL to reduce risk of cardiac events.  Discussed next pharmacologic steps including Nexlizet and Repatha.  However, once I explained and demonstrated injection technique of Repatha, patient was no longer interested.  Explained mechanism of action of Nexlizet and patient had more interest. Had multiple questions. Printed patient information pamphlets and answered questions  regarding possible side effects.  Patient reports she will reseacrh at home and then call if she would like to pursue treatment.  Karren Cobble, PharmD, BCACP, [place], [place] [address], [place], [place] [address] Phone: [phone]; Fax: [phone] 12/22/2020 5:17 PM

## 2021-01-03 ENCOUNTER — Ambulatory Visit (INDEPENDENT_AMBULATORY_CARE_PROVIDER_SITE_OTHER): Payer: Medicare Other | Admitting: Internal Medicine

## 2021-01-03 ENCOUNTER — Other Ambulatory Visit: Payer: Self-pay

## 2021-01-03 ENCOUNTER — Encounter: Payer: Self-pay | Admitting: Internal Medicine

## 2021-01-03 DIAGNOSIS — R634 Abnormal weight loss: Secondary | ICD-10-CM

## 2021-01-03 DIAGNOSIS — J4520 Mild intermittent asthma, uncomplicated: Secondary | ICD-10-CM

## 2021-01-03 DIAGNOSIS — K219 Gastro-esophageal reflux disease without esophagitis: Secondary | ICD-10-CM | POA: Diagnosis not present

## 2021-01-03 DIAGNOSIS — I2510 Atherosclerotic heart disease of native coronary artery without angina pectoris: Secondary | ICD-10-CM | POA: Diagnosis not present

## 2021-01-03 DIAGNOSIS — M810 Age-related osteoporosis without current pathological fracture: Secondary | ICD-10-CM | POA: Diagnosis not present

## 2021-01-03 NOTE — Assessment & Plan Note (Signed)
Cont w/Advair 

## 2021-01-03 NOTE — Assessment & Plan Note (Signed)
On Prolia SQ Potential prilosec side effects on bones discussed

## 2021-01-03 NOTE — Patient Instructions (Signed)
Evolocumab injection What is this medication? EVOLOCUMAB (e voe LOK ue mab) treats high cholesterol. It is used with lifestyle changes, like diet and exercise. It is used alone or with othermedicines. This medicine may be used for other purposes; ask your health care provider orpharmacist if you have questions. COMMON BRAND NAME(S): Repatha What should I tell my care team before I take this medication? They need to know if you have any of these conditions: an unusual or allergic reaction to evolocumab, other medicines, latex, foods, dyes, or preservatives pregnant or trying to get pregnant breast-feeding How should I use this medication? This medicine is injected under the skin. You will be taught how to prepare and give it. Take it as directed on the prescription label at the same time everyday. Keep taking it unless your health care provider tells you to stop. It is important that you put your used needles and syringes in a special sharps container. Do not put them in a trash can. If you do not have a sharpscontainer, call your pharmacist or health care provider to get one. This medicine comes with INSTRUCTIONS FOR USE. Ask your pharmacist for directions on how to use this medicine. Read the information carefully. Talk toyour pharmacist or health care provider if you have questions. Talk to your health care provider about the use of this medicine in children. While it may be prescribed for children as young as 10 for selected conditions,precautions do apply. Overdosage: If you think you have taken too much of this medicine contact apoison control center or emergency room at once. NOTE: This medicine is only for you. Do not share this medicine with others. What if I miss a dose? It is important not to miss any doses. Talk to your health care provider aboutwhat to do if you miss a dose. What may interact with this medication? Interactions are not expected. This list may not describe all possible  interactions. Give your health care provider a list of all the medicines, herbs, non-prescription drugs, or dietary supplements you use. Also tell them if you smoke, drink alcohol, or use illegaldrugs. Some items may interact with your medicine. What should I watch for while using this medication? Visit your health care provider for regular checks on your progress. Tell your health care provider if your symptoms do not start to get better or if they getworse. You may need blood work while you are taking this drug. Do not wear the on-body infuser during an MRI. Taking this drug is only part of a total heart healthy program. Your health care provider may give you a special diet to follow. Avoid alcohol. Avoidsmoking. Ask your health care provider how much you should exercise. What side effects may I notice from receiving this medication? Side effects that you should report to your doctor or health care provider assoon as possible: allergic reactions (skin rash, itching or hives; swelling of the face, lips, or tongue) high blood sugar (increased hunger, thirst, or urination; unusually weak or tired, blurry vision) infection (fever, chills, cough, sore throat, pain, or trouble passing urine) Side effects that usually do not require medical attention (report to yourdoctor or health care provider if they continue or are bothersome): diarrhea muscle pain nausea pain, redness, or irritation at site where injected This list may not describe all possible side effects. Call your doctor for medical advice about side effects. You may report side effects to FDA at1-800-FDA-1088. Where should I keep my medication? Keep out of the reach  of children and pets. Store in a refrigerator or at room temperature between 20 and 25 degrees C (68and 77 degrees F). Refrigeration (preferred): Store it in the refrigerator. Do not freeze. Keep it in the original carton until you are ready to take it. Remove the dose from the  carton about 30 minutes before it is time for you to take it. Throw away anyunused medicine after the expiration date. Room temperature: This medicine may be stored at room temperature for up to 30 days. Keep it in the original carton until you are ready to take it. If it is stored at room temperature, throw away any unused medicine after 30 days orafter it expires, whichever is first. Protect from light. Do not shake. Avoid exposure to extreme heat. To get rid of medicines that are no longer needed or have expired: Take the medicine to a medicine take-back program. Check with your pharmacy or law enforcement to find a location. If you cannot return the medicine, ask your pharmacist or health care provider how to get rid of this medicine safely. NOTE: This sheet is a summary. It may not cover all possible information. If you have questions about this medicine, talk to your doctor, pharmacist, orhealth care provider.  2022 Elsevier/Gold Standard (2019-06-17 12:26:14)

## 2021-01-03 NOTE — Assessment & Plan Note (Addendum)
Doing well off Megace so far... Megace option is discussed

## 2021-01-03 NOTE — Progress Notes (Signed)
Subjective:  Patient ID: Cindy Meza, female    DOB: 01-31-1936  Age: 85 y.o. MRN: [id number]  CC: Follow-up   HPI Cindy Meza presents for GERD, osteoporosis, asthma f/u Pt saw Dr Cindy Meza - on Prilosec now  Outpatient Medications Prior to Visit  Medication Sig Dispense Refill   ADVAIR HFA 115-21 MCG/ACT inhaler INHALE 2 PUFFS INTO THE LUNGS TWICE DAILY 12 g 3   aspirin 81 MG tablet Take 81 mg by mouth daily.     Calcium-Magnesium-Vitamin D (CALCIUM 500 PO) Take 2 each by mouth daily.     cholecalciferol (VITAMIN D3) 25 MCG (1000 UNIT) tablet Take 1,000 Units by mouth daily.     CVS OMEGA-3 KRILL OIL 500 MG CAPS Take 500 capsules by mouth daily.     Cyanocobalamin (VITAMIN B-12) 1000 MCG SUBL Place 1 tablet (1,000 mcg total) under the tongue daily. 100 tablet 3   famotidine (PEPCID) 40 MG tablet Take 40 mg by mouth at bedtime.     fludrocortisone (FLORINEF) 0.1 MG tablet Take 0.5 tablets (0.05 mg total) by mouth daily. 45 tablet 3   fluticasone (FLONASE) 50 MCG/ACT nasal spray Place 2 sprays into both nostrils daily. (Patient taking differently: Place 2 sprays into both nostrils as needed.) 16 g 6   loratadine (CLARITIN) 10 MG tablet Take 10 mg by mouth daily.     nystatin-triamcinolone ointment (MYCOLOG) Apply topically 2 (two) times daily as needed.     omeprazole (PRILOSEC) 40 MG capsule Take 1 capsule (40 mg total) by mouth daily before breakfast. 90 capsule 1   polyethylene glycol (MIRALAX / GLYCOLAX) 17 g packet Take 17 g by mouth daily as needed.     No facility-administered medications prior to visit.    ROS: Review of Systems  Constitutional:  Negative for activity change, appetite change, chills, fatigue and unexpected weight change.  HENT:  Negative for congestion, mouth sores and sinus pressure.   Eyes:  Negative for visual disturbance.  Respiratory:  Negative for cough and chest tightness.   Gastrointestinal:  Positive for abdominal pain. Negative for  nausea.  Genitourinary:  Negative for difficulty urinating, frequency and vaginal pain.  Musculoskeletal:  Negative for back pain and gait problem.  Skin:  Negative for pallor and rash.  Neurological:  Negative for dizziness, tremors, weakness, numbness and headaches.  Psychiatric/Behavioral:  Negative for confusion and sleep disturbance.    Objective:  BP 132/74 (BP Location: Right Arm, Patient Position: Sitting, Cuff Size: Large)   Pulse 90   Temp 97.9 F (36.6 C) (Oral)   Ht 5' (1.524 m)   Wt 103 lb 9.6 oz (47 kg)   SpO2 100%   BMI 20.23 kg/m   BP Readings from Last 3 Encounters:  01/03/21 132/74  11/25/20 128/78  11/15/20 122/72    Wt Readings from Last 3 Encounters:  01/03/21 103 lb 9.6 oz (47 kg)  11/25/20 103 lb 2 oz (46.8 kg)  11/15/20 105 lb (47.6 kg)    Physical Exam Constitutional:      General: She is not in acute distress.    Appearance: She is well-developed. She is not ill-appearing.  HENT:     Head: Normocephalic.     Right Ear: External ear normal.     Left Ear: External ear normal.     Nose: Nose normal.  Eyes:     General:        Right eye: No discharge.        Left  eye: No discharge.     Conjunctiva/sclera: Conjunctivae normal.     Pupils: Pupils are equal, round, and reactive to light.  Neck:     Thyroid: No thyromegaly.     Vascular: No JVD.     Trachea: No tracheal deviation.  Cardiovascular:     Rate and Rhythm: Normal rate and regular rhythm.     Heart sounds: Normal heart sounds.  Pulmonary:     Effort: No respiratory distress.     Breath sounds: No stridor. No wheezing.  Abdominal:     General: Bowel sounds are normal. There is no distension.     Palpations: Abdomen is soft. There is no mass.     Tenderness: There is no abdominal tenderness. There is no guarding or rebound.  Musculoskeletal:        General: No tenderness.     Cervical back: Normal range of motion and neck supple. No rigidity.  Lymphadenopathy:     Cervical: No  cervical adenopathy.  Skin:    Findings: No erythema or rash.  Neurological:     Mental Status: She is oriented to person, place, and time.     Cranial Nerves: No cranial nerve deficit.     Motor: No abnormal muscle tone.     Coordination: Coordination normal.     Deep Tendon Reflexes: Reflexes normal.  Psychiatric:        Behavior: Behavior normal.        Thought Content: Thought content normal.        Judgment: Judgment normal.  Thin  Lab Results  Component Value Date   WBC 5.9 11/15/2020   HGB 10.9 (L) 11/15/2020   HCT 33.4 (L) 11/15/2020   PLT 233 11/15/2020   GLUCOSE 110 (H) 11/11/2020   CHOL 246 (H) 11/15/2020   TRIG 49 11/15/2020   HDL 109 11/15/2020   LDLDIRECT 104.0 03/17/2013   LDLCALC 129 (H) 11/15/2020   ALT 13 10/26/2019   AST 16 02/26/2020   NA 140 11/11/2020   K 3.8 11/11/2020   CL 105 11/11/2020   CREATININE 1.05 11/11/2020   BUN 17 11/11/2020   CO2 28 11/11/2020   TSH 1.52 01/19/2019   INR 1.04 06/12/2016   HGBA1C 4.9 04/09/2017    CT Abdomen Pelvis W Contrast  Result Date: 09/28/2019 CLINICAL DATA:  Periumbilical pain weight loss.  Constipation. EXAM: CT ABDOMEN AND PELVIS WITH CONTRAST TECHNIQUE: Multidetector CT imaging of the abdomen and pelvis was performed using the standard protocol following bolus administration of intravenous contrast. CONTRAST:  154mL ISOVUE-300 IOPAMIDOL (ISOVUE-300) INJECTION 61% COMPARISON:  04/28/2015 FINDINGS: Lower chest: No acute abnormality. Hepatobiliary: No focal liver abnormality is seen. No gallstones, gallbladder wall thickening, or biliary dilatation. Pancreas: Unremarkable. No pancreatic ductal dilatation or surrounding inflammatory changes. Spleen: Normal in size without focal abnormality. Adrenals/Urinary Tract: Normal appearance of the adrenal glands. No kidney mass or hydronephrosis identified bilaterally urinary bladder unremarkable. Stomach/Bowel: Stomach is within normal limits. The appendix is not confidently  identified. No evidence of bowel wall thickening, distention, or inflammatory changes. Vascular/Lymphatic: Aortic atherosclerosis. No aneurysm. No abdominopelvic adenopathy identified. Reproductive: Status post hysterectomy. No adnexal masses. Other: No abdominal wall hernia or abnormality. No abdominopelvic ascites. Musculoskeletal: Previous ORIF of right femur. Sclerotic lesion identified within the left sacral wing, unchanged. Favor benign bone island. IMPRESSION: 1. No acute findings within the abdomen or pelvis. No mass or adenopathy noted. 2.  Aortic Atherosclerosis (ICD10-I70.0). Electronically Signed   By: Queen Slough.D.  On: 09/28/2019 12:52    Assessment & Plan:   There are no diagnoses linked to this encounter.   No orders of the defined types were placed in this encounter.    Follow-up: No follow-ups on file.  [name], MD

## 2021-01-03 NOTE — Assessment & Plan Note (Addendum)
Pt saw Dr [name] - on Prilosec now Potential prilosec side effects on bones discussed  OK to take Pepcid prn Better off Crestor

## 2021-01-03 NOTE — Assessment & Plan Note (Signed)
Off Crestor 

## 2021-01-11 ENCOUNTER — Ambulatory Visit: Payer: Medicare Other | Admitting: Family Medicine

## 2021-02-15 ENCOUNTER — Encounter: Payer: Self-pay | Admitting: Internal Medicine

## 2021-02-15 ENCOUNTER — Ambulatory Visit (INDEPENDENT_AMBULATORY_CARE_PROVIDER_SITE_OTHER): Payer: Medicare Other | Admitting: Internal Medicine

## 2021-02-15 VITALS — BP 120/80 | HR 63 | Ht 62.0 in | Wt 103.0 lb

## 2021-02-15 DIAGNOSIS — K5909 Other constipation: Secondary | ICD-10-CM

## 2021-02-15 DIAGNOSIS — R1033 Periumbilical pain: Secondary | ICD-10-CM

## 2021-02-15 DIAGNOSIS — R634 Abnormal weight loss: Secondary | ICD-10-CM

## 2021-02-15 NOTE — Patient Instructions (Signed)
I am glad you are feeling better.  We discussed and agreed on the following:  Stop the Pepcid also known as famotidine  Continue the omeprazole or generic Prilosec at 40 mg daily  Work on increasing protein in your diet please read the information I have provided about healthy eating  Continue the MiraLAX for constipation  I agree that you do not need treatment for your high cholesterol because your HDL is very high and your triglycerides are low which are excellent numbers and prognostic signs.    I appreciate the opportunity to care for you. Gatha Mayer, MD, [facility]

## 2021-02-15 NOTE — Progress Notes (Signed)
Cindy Meza 85 y.o. June 30, 1936 [id number]  Assessment & Plan:   Encounter Diagnoses  Name Primary?   Periumbilical abdominal pain Yes   Loss of weight - mild-stable now     She is improved.  Weight is stable which is good.  Increased protein intake certainly in her best interest I think at her age.  I agree that lipid lowering treatment is not necessary.  I have explained that total cholesterol and LDL being high are not necessarily a problem based upon emerging information that I see and statins and other treatments do not necessarily lower the LDL that is a problem.  Sure she has some calcification in her arteries but it 12 that not a surprise.  Her triglycerides are very low and her HDLs are very high and that is excellent prognostic information.  At any rate she does not want to take that medicine so that is her choice.  I have encouraged her to have nutrient dense food intake particularly regarding protein.  I think that the drinks she is using i.e. Ensure and other protein shakes are reasonable but she should look at the ingredients and make sure they do not have too many sugars.  I have given her some handouts about healthy eating from the [facility].   Subjective:   Chief Complaint: Follow-up of abdominal pain last seen Nov 25, 2020  HPI Cindy Meza reports that she is much better on omeprazole 40 mg daily.  Weight is stable.  She would still like to gain weight.  She reduced her famotidine to 20 mg at bedtime OTC and now has been off it for a month or so and has not noted any new or worsening symptoms i.e. she has tolerated that.  She reports that MiraLAX helps with her defecation better than Benefiber and she is using that.  Constipation is much better.  She brings up that she was recommended to take a lipid-lowering therapy but she believes since her triglycerides are low and her HDLs are so good she does not need that and she is not inclined to do so.  She does use  some Ensure and protein drinks.  Cooks for herself.  Eats chicken as far as meat, mostly rare red meat.  She reports that Dr. Alain Marion has told her not to read too much specifically when it comes to side effects profiled about medications.  She still feels like the midodrine bothers her some.  She is taking it however.  Wt Readings from Last 3 Encounters:  02/15/21 103 lb (46.7 kg)  01/03/21 103 lb 9.6 oz (47 kg)  11/25/20 103 lb 2 oz (46.8 kg)     Allergies  Allergen Reactions   Fosamax [Alendronate Sodium]     Paresthesias, abd pain   Gabapentin     dizziness   Lidocaine Swelling    Of face and lips.  Novocaine does same thing.   Midodrine     Nausea    Risedronate Sodium     Knee pain   Shellfish Allergy Hives   Statins     REACTION: pains   Current Meds  Medication Sig   ADVAIR HFA 115-21 MCG/ACT inhaler INHALE 2 PUFFS INTO THE LUNGS TWICE DAILY   aspirin 81 MG tablet Take 81 mg by mouth daily.   Calcium-Magnesium-Vitamin D (CALCIUM 500 PO) Take 2 each by mouth daily.   cholecalciferol (VITAMIN D3) 25 MCG (1000 UNIT) tablet Take 1,000 Units by mouth daily.   CVS OMEGA-3  KRILL OIL 500 MG CAPS Take 500 capsules by mouth daily.   Cyanocobalamin (VITAMIN B-12) 1000 MCG SUBL Place 1 tablet (1,000 mcg total) under the tongue daily.   famotidine (PEPCID) 40 MG tablet Take 40 mg by mouth at bedtime.   fludrocortisone (FLORINEF) 0.1 MG tablet Take 0.5 tablets (0.05 mg total) by mouth daily.   fluticasone (FLONASE) 50 MCG/ACT nasal spray Place 2 sprays into both nostrils daily. (Patient taking differently: Place 2 sprays into both nostrils as needed.)   loratadine (CLARITIN) 10 MG tablet Take 10 mg by mouth daily.   nystatin-triamcinolone ointment (MYCOLOG) Apply topically 2 (two) times daily as needed.   omeprazole (PRILOSEC) 40 MG capsule Take 1 capsule (40 mg total) by mouth daily before breakfast.   polyethylene glycol (MIRALAX / GLYCOLAX) 17 g packet Take 17 g by mouth daily  as needed.   Past Medical History:  Diagnosis Date   Allergy    Anemia    Asthma    Dr. Melvyn Novas   Blood transfusion without reported diagnosis    Breast cyst    Left   Cataract    Dr. Gershon Crane   Complication of anesthesia    ' I HAD HALLUCINATIONS WHEN THEY DID MY HIP "   GERD (gastroesophageal reflux disease)    Heart murmur    Hemorrhoids    History of colonic polyps [facility]   hx of adenomas (small) 1998   OA (osteoarthritis)    Orthostatic hypotension    Osteopenia    PAC (premature atrial contraction)    Vitamin D deficiency    Past Surgical History:  Procedure Laterality Date   ABDOMINAL HYSTERECTOMY     complete   APPENDECTOMY     BREAST CYST EXCISION     CATARACT EXTRACTION, BILATERAL     04/23/18 left eye, 04/02/18 right eye   COLONOSCOPY  Multiple   Adenomatous colon polyps   ESOPHAGOGASTRODUODENOSCOPY  2010   INTRAMEDULLARY (IM) NAIL INTERTROCHANTERIC Right 06/13/2016   Procedure: INTRAMEDULLARY (IM) NAIL INTERTROCHANTRIC;  Surgeon: Tania Ade, MD;  Location: [facility];  Service: Orthopedics;  Laterality: Right;   TONSILLECTOMY     Social History   Social History Narrative   She lives alone in a [place] home.  No children.   Retired Education officer, museum.   Highest level of education:  Masters degree   family history includes Diabetes (age of onset: 86) in her mother; Hypertension in an other family member.   Review of Systems As above  Objective:   Physical Exam BP 120/80   Pulse 63   Ht '5\' 2"'$  (1.575 m)   Wt 103 lb (46.7 kg)   SpO2 98%   BMI 18.84 kg/m  21 minutes total time spent with the patient and the chart

## 2021-03-08 ENCOUNTER — Other Ambulatory Visit (INDEPENDENT_AMBULATORY_CARE_PROVIDER_SITE_OTHER): Payer: Medicare Other

## 2021-03-08 DIAGNOSIS — Z23 Encounter for immunization: Secondary | ICD-10-CM

## 2021-03-10 ENCOUNTER — Encounter: Payer: Self-pay | Admitting: Internal Medicine

## 2021-03-10 ENCOUNTER — Other Ambulatory Visit: Payer: Self-pay

## 2021-03-10 ENCOUNTER — Ambulatory Visit (INDEPENDENT_AMBULATORY_CARE_PROVIDER_SITE_OTHER): Payer: Medicare Other | Admitting: Internal Medicine

## 2021-03-10 VITALS — BP 110/80 | HR 70 | Ht 62.0 in | Wt 103.0 lb

## 2021-03-10 DIAGNOSIS — I951 Orthostatic hypotension: Secondary | ICD-10-CM | POA: Diagnosis not present

## 2021-03-10 DIAGNOSIS — R2681 Unsteadiness on feet: Secondary | ICD-10-CM

## 2021-03-10 DIAGNOSIS — M810 Age-related osteoporosis without current pathological fracture: Secondary | ICD-10-CM | POA: Diagnosis not present

## 2021-03-10 LAB — PHOSPHORUS: Phosphorus: 3.2 mg/dL (ref 2.3–4.6)

## 2021-03-10 LAB — MAGNESIUM: Magnesium: 2 mg/dL (ref 1.5–2.5)

## 2021-03-10 LAB — CBC
HCT: 33.6 % — ABNORMAL LOW (ref 36.0–46.0)
Hemoglobin: 11.1 g/dL — ABNORMAL LOW (ref 12.0–15.0)
MCHC: 33.1 g/dL (ref 30.0–36.0)
MCV: 93.5 fl (ref 78.0–100.0)
Platelets: 216 10*3/uL (ref 150.0–400.0)
RBC: 3.59 Mil/uL — ABNORMAL LOW (ref 3.87–5.11)
RDW: 12.6 % (ref 11.5–15.5)
WBC: 7.1 10*3/uL (ref 4.0–10.5)

## 2021-03-10 LAB — TSH: TSH: 1.29 u[IU]/mL (ref 0.35–5.50)

## 2021-03-10 LAB — VITAMIN D 25 HYDROXY (VIT D DEFICIENCY, FRACTURES): VITD: 62.93 ng/mL (ref 30.00–100.00)

## 2021-03-10 LAB — COMPREHENSIVE METABOLIC PANEL
ALT: 12 U/L (ref 0–35)
AST: 19 U/L (ref 0–37)
Albumin: 3.9 g/dL (ref 3.5–5.2)
Alkaline Phosphatase: 38 U/L — ABNORMAL LOW (ref 39–117)
BUN: 17 mg/dL (ref 6–23)
CO2: 29 mEq/L (ref 19–32)
Calcium: 9.6 mg/dL (ref 8.4–10.5)
Chloride: 102 mEq/L (ref 96–112)
Creatinine, Ser: 0.96 mg/dL (ref 0.40–1.20)
GFR: 54.86 mL/min — ABNORMAL LOW (ref >60.00)
Glucose, Bld: 118 mg/dL — ABNORMAL HIGH (ref 70–99)
Potassium: 3.8 mEq/L (ref 3.5–5.1)
Sodium: 137 mEq/L (ref 135–145)
Total Bilirubin: 0.4 mg/dL (ref 0.2–1.2)
Total Protein: 7.3 g/dL (ref 6.0–8.3)

## 2021-03-10 LAB — T4, FREE: Free T4: 0.82 ng/dL (ref 0.60–1.60)

## 2021-03-10 MED ORDER — DENOSUMAB 60 MG/ML ~~LOC~~ SOSY: 60.0000 mg | PREFILLED_SYRINGE | Freq: Once | SUBCUTANEOUS | Status: DC

## 2021-03-10 NOTE — Progress Notes (Signed)
Name: Cindy Meza  MRN/ DOB: [id number], 02/25/1936    Age/ Sex: 85 y.o., female     PCP: Cassandria Anger, MD   Reason for Endocrinology Evaluation: Osteoporosis     Initial Endocrinology Clinic Visit: 03/10/2020    PATIENT IDENTIFIER: Cindy Meza is a 85 y.o., female with a past medical history of OSTEOPOROSIS, aSTHMA AND cad. She has followed with [facility] since 03/10/2020 for consultative assistance with management of her osteoporosis.   HISTORICAL SUMMARY:   Pt was diagnosed with osteoporosis: 2018   Menarche at age : 61 Menopausal at age : in her 40's  Fracture Hx: Right hip fracture  Hx of HRT: yes  FH of osteoporosis or hip fracture: no Prior Hx of anti-estrogenic therapy : no  Prior Hx of anti-resorptive therapy : Intolerant to Fosamax - heel pain  Many years ago. Restarted ~ 2020 but stopped in 2021 due to abdominal pain.   First Prolia injection 05/05/2020   SUBJECTIVE:    Today (03/10/2021):  Cindy Meza is here for osteoporosis .  She has tolerated her last prolia injection without side effects   She has chronic back pain , right thigh pain, she has noted leg weakness and imbalance that she attributes to missing her Prolia  Saw Dr. [name] started on Prilosec     Calcium 1 tabs BID Vitamin D3 1000 iu daily  Prolia 60 mg [place] Q 6 months       HISTORY:  Past Medical History:  Past Medical History:  Diagnosis Date   Allergy    Anemia    Asthma    Dr. Melvyn Novas   Blood transfusion without reported diagnosis    Breast cyst    Left   Cataract    Dr. Gershon Crane   Complication of anesthesia    ' I HAD HALLUCINATIONS WHEN THEY DID MY HIP "   GERD (gastroesophageal reflux disease)    Heart murmur    Hemorrhoids    History of colonic polyps [place]   hx of adenomas (small) 1998   OA (osteoarthritis)    Orthostatic hypotension    Osteopenia    PAC (premature atrial contraction)    Vitamin D deficiency    Past  Surgical History:  Past Surgical History:  Procedure Laterality Date   ABDOMINAL HYSTERECTOMY     complete   APPENDECTOMY     BREAST CYST EXCISION     CATARACT EXTRACTION, BILATERAL     04/23/18 left eye, 04/02/18 right eye   COLONOSCOPY  Multiple   Adenomatous colon polyps   ESOPHAGOGASTRODUODENOSCOPY  2010   INTRAMEDULLARY (IM) NAIL INTERTROCHANTERIC Right 06/13/2016   Procedure: INTRAMEDULLARY (IM) NAIL INTERTROCHANTRIC;  Surgeon: Tania Ade, MD;  Location: [facility];  Service: Orthopedics;  Laterality: Right;   TONSILLECTOMY     Social History:  reports that she has never smoked. She has never used smokeless tobacco. She reports that she does not currently use alcohol. She reports that she does not use drugs. Family History:  Family History  Problem Relation Age of Onset   Diabetes Mother 70       Deceased   Hypertension Other    Colon cancer Neg Hx    Pancreatic cancer Neg Hx    Stomach cancer Neg Hx    Esophageal cancer Neg Hx    Rectal cancer Neg Hx      HOME MEDICATIONS: Allergies as of 03/10/2021       Reactions   Fosamax [  alendronate Sodium]    Paresthesias, abd pain   Gabapentin    dizziness   Lidocaine Swelling   Of face and lips.  Novocaine does same thing.   Midodrine    Nausea   Risedronate Sodium    Knee pain   Shellfish Allergy Hives   Statins    REACTION: pains        Medication List        Accurate as of March 10, 2021  2:07 PM. If you have any questions, ask your nurse or doctor.          Advair HFA 115-21 MCG/ACT inhaler Generic drug: fluticasone-salmeterol INHALE 2 PUFFS INTO THE LUNGS TWICE DAILY   aspirin 81 MG tablet Take 81 mg by mouth daily.   CALCIUM 500 PO Take 2 each by mouth daily.   cholecalciferol 25 MCG (1000 UNIT) tablet Commonly known as: VITAMIN D3 Take 1,000 Units by mouth daily.   CVS Omega-3 Krill Oil 500 MG Caps Take 500 capsules by mouth daily.   fludrocortisone 0.1 MG tablet Commonly known as:  FLORINEF Take 0.5 tablets (0.05 mg total) by mouth daily.   fluticasone 50 MCG/ACT nasal spray Commonly known as: FLONASE Place 2 sprays into both nostrils daily. What changed:  when to take this reasons to take this   loratadine 10 MG tablet Commonly known as: CLARITIN Take 10 mg by mouth daily.   nystatin-triamcinolone ointment Commonly known as: MYCOLOG Apply topically 2 (two) times daily as needed.   omeprazole 40 MG capsule Commonly known as: PRILOSEC Take 1 capsule (40 mg total) by mouth daily before breakfast.   polyethylene glycol 17 g packet Commonly known as: MIRALAX / GLYCOLAX Take 17 g by mouth daily as needed.   Vitamin B-12 1000 MCG Subl Place 1 tablet (1,000 mcg total) under the tongue daily.          OBJECTIVE:   PHYSICAL EXAM: VS: BP 126/74 (BP Location: Left Arm, Patient Position: Sitting, Cuff Size: Small)   Pulse 70   Ht '5\' 2"'$  (1.575 m)   Wt 103 lb (46.7 kg)   SpO2 95%   BMI 18.84 kg/m    EXAM: General: Pt appears well and is in NAD  Lungs: Clear with good BS bilat with no rales, rhonchi, or wheezes  Heart: Auscultation: RRR.  Abdomen: Normoactive bowel sounds, soft, nontender, without masses or organomegaly palpable  Extremities:  BL LE: No pretibial edema normal ROM and strength.  Mental Status: Judgment, insight: Intact Orientation: Oriented to time, place, and person Mood and affect: No depression, anxiety, or agitation     DATA REVIEWED:    Results for Cindy, Meza (MRN [id number]) as of 03/14/2021 12:29  Ref. Range 03/10/2021 15:24  Sodium Latest Ref Range: 135 - 145 mEq/L 137  Potassium Latest Ref Range: 3.5 - 5.1 mEq/L 3.8  Chloride Latest Ref Range: 96 - 112 mEq/L 102  CO2 Latest Ref Range: 19 - 32 mEq/L 29  Glucose Latest Ref Range: 70 - 99 mg/dL 118 (H)  BUN Latest Ref Range: 6 - 23 mg/dL 17  Creatinine Latest Ref Range: 0.40 - 1.20 mg/dL 0.96  Calcium Latest Ref Range: 8.4 - 10.5 mg/dL 9.6  Phosphorus Latest  Ref Range: 2.3 - 4.6 mg/dL 3.2  Magnesium Latest Ref Range: 1.5 - 2.5 mg/dL 2.0  Alkaline Phosphatase Latest Ref Range: 39 - 117 U/L 38 (L)  Albumin Latest Ref Range: 3.5 - 5.2 g/dL 3.9  AST Latest Ref Range: 0 -  37 U/L 19  ALT Latest Ref Range: 0 - 35 U/L 12  Total Protein Latest Ref Range: 6.0 - 8.3 g/dL 7.3  Total Bilirubin Latest Ref Range: 0.2 - 1.2 mg/dL 0.4  GFR Latest Ref Range: >60.00 mL/min 54.86 (L)  VITD Latest Ref Range: 30.00 - 100.00 ng/mL 62.93  WBC Latest Ref Range: 4.0 - 10.5 K/uL 7.1  RBC Latest Ref Range: 3.87 - 5.11 Mil/uL 3.59 (L)  Hemoglobin Latest Ref Range: 12.0 - 15.0 g/dL 11.1 (L)  HCT Latest Ref Range: 36.0 - 46.0 % 33.6 (L)  MCV Latest Ref Range: 78.0 - 100.0 fl 93.5  MCHC Latest Ref Range: 30.0 - 36.0 g/dL 33.1  RDW Latest Ref Range: 11.5 - 15.5 % 12.6  Platelets Latest Ref Range: 150.0 - 400.0 K/uL 216.0  TSH Latest Ref Range: 0.35 - 5.50 uIU/mL 1.29  T4,Free(Direct) Latest Ref Range: 0.60 - 1.60 ng/dL 0.82  NOTE: Unknown Pend  Appearance Latest Ref Range: CLEAR  CLEAR  Bilirubin Urine Latest Ref Range: NEGATIVE  NEGATIVE  Color, Urine Latest Ref Range: YELLOW  YELLOW  Glucose, UA Latest Ref Range: NEGATIVE  NEGATIVE  Hgb urine dipstick Latest Ref Range: NEGATIVE  NEGATIVE  Ketones, ur Latest Ref Range: NEGATIVE  NEGATIVE  Leukocyte Esterase Latest Ref Range: NEGATIVE  NEGATIVE  pH Latest Ref Range: 5.0 - 8.0  6.0  Protein Latest Ref Range: NEGATIVE  NEGATIVE  Specific Gravity, Urine Latest Ref Range: 1.001 - 1.035  1.007  Bacteria, UA Latest Ref Range: NONE SEEN /HPF NONE SEEN  Hyaline Cast Latest Ref Range: NONE SEEN /LPF NONE SEEN  RBC / HPF Latest Ref Range: 0 - 2 /HPF NONE SEEN  REFLEXIVE URINE CULTURE Unknown Pend  Squamous Epithelial / LPF Latest Ref Range: < OR = 5 /HPF NONE SEEN  WBC, UA Latest Ref Range: 0 - 5 /HPF NONE SEEN  Nitrites, Initial Latest Ref Range: NEGATIVE  NEGATIVE    ASSESSMENT / PLAN / RECOMMENDATIONS:   1.  Osteoporosis :  - Started on prolia 04/2020, she declines having a prolia injection in 11/2020, she  received a dose of prolia  03/2021  -She has tolerated the initial injection without any side effects she received her second dose of Prolia today. -She has been taking vitamin D up to 4 times a day per patient this is based on cardiology recommendations? Repeat labs shows stable GFR and upper normal of Vitamin D   Next injection due 09/2020  Medications  Continue calcium 500 mg twice daily Continue Vitamin D 1000 iu daily  Prolia 60 mg SQ every 6 months    2. Orthostatic Hypotension:   -Per pt this is chronic  - She is under the impression that this may have to do with missing Prolia. She denies having a prolia injection in 11/2020 - TFT's, Mg, phos,BMP and Vitamin D and UA  unrevealing - The only abnormality that could explained this is anemia - Pt will be directed to PCP for further management      Follow-up in 1 year  Signed electronically by: Mack Guise, MD  [facility]  [facility] [address] [place], [place] [address] Phone: [phone] FAX: [phone]      CC: Cassandria Anger, MD [place] [address] Phone: [phone]  Fax: [phone]   Return to Endocrinology clinic as below: Future Appointments  Date Time Provider [place]  04/05/2021  2:00 PM [name], Evie Lacks, MD  [facility] None  07/12/2021 11:20 AM [name], MD [facility]  11/15/2021 11:10 AM Renella Steig, Melanie Crazier, MD [facility] None

## 2021-03-10 NOTE — Patient Instructions (Addendum)
-   Continue Calcium tablets twice daily  - Continue Vitamin D 1000 iu daily

## 2021-03-10 NOTE — Telephone Encounter (Signed)
Hello Cindy Meza we see that you put message that patient had prolia on 11/11/20. Per Dr Kelton Pillar patient did not and patient states she didn't. We were not able to find any documentation that injection was given. Dr Kelton Pillar did see patient on 11/11/20 but no injections.

## 2021-03-10 NOTE — Telephone Encounter (Signed)
Disregard Theadora Rama spoke with Dr Kelton Pillar and we got it taken care of. Thank you

## 2021-03-11 LAB — URINALYSIS W MICROSCOPIC + REFLEX CULTURE
Bacteria, UA: NONE SEEN /HPF
Bilirubin Urine: NEGATIVE
Glucose, UA: NEGATIVE
Hgb urine dipstick: NEGATIVE
Hyaline Cast: NONE SEEN /LPF
Ketones, ur: NEGATIVE
Leukocyte Esterase: NEGATIVE
Nitrites, Initial: NEGATIVE
Protein, ur: NEGATIVE
RBC / HPF: NONE SEEN /HPF (ref 0–2)
Specific Gravity, Urine: 1.007 (ref 1.001–1.035)
Squamous Epithelial / HPF: NONE SEEN /HPF (ref <=5)
WBC, UA: NONE SEEN /HPF (ref 0–5)
pH: 6 (ref 5.0–8.0)

## 2021-03-11 LAB — NO CULTURE INDICATED

## 2021-03-14 ENCOUNTER — Encounter: Payer: Self-pay | Admitting: Internal Medicine

## 2021-03-14 ENCOUNTER — Telehealth: Payer: Self-pay | Admitting: Internal Medicine

## 2021-03-14 ENCOUNTER — Telehealth: Payer: Self-pay

## 2021-03-14 NOTE — Telephone Encounter (Signed)
Please let the patient know that her  calcium, vitamin D, thyroid , magnesium and phosphorus are all normal.  Her kidney function is improving    The only abnormal thing that I see that could explained the unsteadiness and drop in her blood pressure at this time is Anemia.    I suggest she follows with her PCP to figure out the cause and treat accordingly.     Thanks   [name] Jordan, MD  [facility]  [facility] [address] [place], [place] [address] Phone: [phone] FAX: [phone]

## 2021-03-14 NOTE — Telephone Encounter (Signed)
Vm left for patient to call back.

## 2021-03-15 ENCOUNTER — Telehealth: Payer: Self-pay | Admitting: Internal Medicine

## 2021-03-15 NOTE — Telephone Encounter (Signed)
Patient has been notified and copy of lab results will be mailed.

## 2021-03-16 NOTE — Telephone Encounter (Signed)
Attempted to return call.  No answer and no VM

## 2021-03-16 NOTE — Telephone Encounter (Signed)
Patient reports continued abdominal pain.  She has nausea and "weak around my waist and a little wobbly".  Symptoms worse since stopping pepcid.  She reports that you and she discussed possible EGD.  Please advise next steps.

## 2021-03-16 NOTE — Telephone Encounter (Signed)
Patient notified

## 2021-03-16 NOTE — Telephone Encounter (Signed)
Restart pepcid qhs  If that does not make a difference then call back

## 2021-03-17 NOTE — Telephone Encounter (Signed)
Pt called back for labs results.

## 2021-03-28 ENCOUNTER — Ambulatory Visit (INDEPENDENT_AMBULATORY_CARE_PROVIDER_SITE_OTHER): Payer: Medicare Other | Admitting: Internal Medicine

## 2021-03-28 ENCOUNTER — Encounter: Payer: Self-pay | Admitting: Internal Medicine

## 2021-03-28 ENCOUNTER — Other Ambulatory Visit: Payer: Self-pay

## 2021-03-28 DIAGNOSIS — D638 Anemia in other chronic diseases classified elsewhere: Secondary | ICD-10-CM | POA: Diagnosis not present

## 2021-03-28 DIAGNOSIS — K219 Gastro-esophageal reflux disease without esophagitis: Secondary | ICD-10-CM

## 2021-03-28 DIAGNOSIS — M810 Age-related osteoporosis without current pathological fracture: Secondary | ICD-10-CM | POA: Diagnosis not present

## 2021-03-28 DIAGNOSIS — N1831 Chronic kidney disease, stage 3a: Secondary | ICD-10-CM | POA: Insufficient documentation

## 2021-03-28 DIAGNOSIS — N289 Disorder of kidney and ureter, unspecified: Secondary | ICD-10-CM

## 2021-03-28 DIAGNOSIS — R634 Abnormal weight loss: Secondary | ICD-10-CM

## 2021-03-28 NOTE — Assessment & Plan Note (Addendum)
Stage 3 GFR 54 Hydrate well, take fish oil

## 2021-03-28 NOTE — Assessment & Plan Note (Signed)
pt saw Dr [name] - on Pepcid and Prilosec now

## 2021-03-28 NOTE — Assessment & Plan Note (Signed)
Chronic, multifactorial Hematology appt offered

## 2021-03-28 NOTE — Progress Notes (Signed)
Subjective:  Patient ID: Cindy Meza, female    DOB: 20-May-1936  Age: 85 y.o. MRN: [id number]  CC: Follow-up (3 month f/u)   HPI Ardel Jagger presents for osteoporosis - on Prolia since 2021 C/o anemia on labs C/o abd pain - pt saw Dr [name] - on Pepcid and Prilosec now  Outpatient Medications Prior to Visit  Medication Sig Dispense Refill   ADVAIR HFA 115-21 MCG/ACT inhaler INHALE 2 PUFFS INTO THE LUNGS TWICE DAILY 12 g 3   aspirin 81 MG tablet Take 81 mg by mouth daily.     Calcium-Magnesium-Vitamin D (CALCIUM 500 PO) Take 2 each by mouth daily.     cholecalciferol (VITAMIN D3) 25 MCG (1000 UNIT) tablet Take 1,000 Units by mouth daily.     [employer] OMEGA-3 KRILL OIL 500 MG CAPS Take 500 capsules by mouth daily.     Cyanocobalamin (VITAMIN B-12) 1000 MCG SUBL Place 1 tablet (1,000 mcg total) under the tongue daily. 100 tablet 3   famotidine (PEPCID) 20 MG tablet Take 20 mg by mouth at bedtime.     fludrocortisone (FLORINEF) 0.1 MG tablet Take 0.5 tablets (0.05 mg total) by mouth daily. 45 tablet 3   loratadine (CLARITIN) 10 MG tablet Take 10 mg by mouth daily.     polyethylene glycol (MIRALAX / GLYCOLAX) 17 g packet Take 17 g by mouth daily as needed.     nystatin-triamcinolone ointment (MYCOLOG) Apply topically 2 (two) times daily as needed. (Patient not taking: Reported on 03/28/2021)     omeprazole (PRILOSEC) 40 MG capsule Take 1 capsule (40 mg total) by mouth daily before breakfast. (Patient not taking: Reported on 03/28/2021) 90 capsule 1   No facility-administered medications prior to visit.    ROS: Review of Systems  Objective:  BP 120/72 (BP Location: Left Arm)   Pulse 85   Temp 97.7 F (36.5 C) (Oral)   Ht 5\' 2"  (1.575 m)   Wt 102 lb 3.2 oz (46.4 kg)   SpO2 96%   BMI 18.69 kg/m   BP Readings from Last 3 Encounters:  03/28/21 120/72  03/10/21 110/80  02/15/21 120/80    Wt Readings from Last 3 Encounters:  03/28/21 102 lb 3.2 oz (46.4 kg)   03/10/21 103 lb (46.7 kg)  02/15/21 103 lb (46.7 kg)    Physical Exam  Lab Results  Component Value Date   WBC 7.1 03/10/2021   HGB 11.1 (L) 03/10/2021   HCT 33.6 (L) 03/10/2021   PLT 216.0 03/10/2021   GLUCOSE 118 (H) 03/10/2021   CHOL 246 (H) 11/15/2020   TRIG 49 11/15/2020   HDL 109 11/15/2020   LDLDIRECT 104.0 03/17/2013   LDLCALC 129 (H) 11/15/2020   ALT 12 03/10/2021   AST 19 03/10/2021   NA 137 03/10/2021   K 3.8 03/10/2021   CL 102 03/10/2021   CREATININE 0.96 03/10/2021   BUN 17 03/10/2021   CO2 29 03/10/2021   TSH 1.29 03/10/2021   INR 1.04 06/12/2016   HGBA1C 4.9 04/09/2017    CT Abdomen Pelvis W Contrast  Result Date: 09/28/2019 CLINICAL DATA:  Periumbilical pain weight loss.  Constipation. EXAM: CT ABDOMEN AND PELVIS WITH CONTRAST TECHNIQUE: Multidetector CT imaging of the abdomen and pelvis was performed using the standard protocol following bolus administration of intravenous contrast. CONTRAST:  117mL ISOVUE-300 IOPAMIDOL (ISOVUE-300) INJECTION 61% COMPARISON:  04/28/2015 FINDINGS: Lower chest: No acute abnormality. Hepatobiliary: No focal liver abnormality is seen. No gallstones, gallbladder wall thickening, or biliary  dilatation. Pancreas: Unremarkable. No pancreatic ductal dilatation or surrounding inflammatory changes. Spleen: Normal in size without focal abnormality. Adrenals/Urinary Tract: Normal appearance of the adrenal glands. No kidney mass or hydronephrosis identified bilaterally urinary bladder unremarkable. Stomach/Bowel: Stomach is within normal limits. The appendix is not confidently identified. No evidence of bowel wall thickening, distention, or inflammatory changes. Vascular/Lymphatic: Aortic atherosclerosis. No aneurysm. No abdominopelvic adenopathy identified. Reproductive: Status post hysterectomy. No adnexal masses. Other: No abdominal wall hernia or abnormality. No abdominopelvic ascites. Musculoskeletal: Previous ORIF of right femur.  Sclerotic lesion identified within the left sacral wing, unchanged. Favor benign bone island. IMPRESSION: 1. No acute findings within the abdomen or pelvis. No mass or adenopathy noted. 2.  Aortic Atherosclerosis (ICD10-I70.0). Electronically Signed   By: Kerby Moors M.D.   On: 09/28/2019 12:52    Assessment & Plan:   Problem List Items Addressed This Visit   None     Follow-up: No follow-ups on file.  [name], MD

## 2021-03-28 NOTE — Assessment & Plan Note (Addendum)
Wt Readings from Last 3 Encounters:  03/28/21 102 lb 3.2 oz (46.4 kg)  03/10/21 103 lb (46.7 kg)  02/15/21 103 lb (46.7 kg)   Not taking Megace

## 2021-03-28 NOTE — Assessment & Plan Note (Signed)
on Prolia since 2021 On Vit D

## 2021-03-31 NOTE — Telephone Encounter (Signed)
Patient called to follow up said she has taken the pepcid for two weeks and is still feeling the same seeking advise on what else she can do.

## 2021-04-03 NOTE — Telephone Encounter (Signed)
Left message for patient to call back  

## 2021-04-04 NOTE — Telephone Encounter (Signed)
Patient with continued abdominal pain and occasional rectal bleeding.  She will come in and see Carl Best, RNP on 10/6

## 2021-04-05 ENCOUNTER — Ambulatory Visit: Payer: Medicare Other | Admitting: Internal Medicine

## 2021-04-13 ENCOUNTER — Encounter: Payer: Self-pay | Admitting: Nurse Practitioner

## 2021-04-13 ENCOUNTER — Ambulatory Visit (INDEPENDENT_AMBULATORY_CARE_PROVIDER_SITE_OTHER): Payer: Medicare Other | Admitting: Nurse Practitioner

## 2021-04-13 VITALS — BP 110/60 | HR 84 | Ht 61.0 in | Wt 100.0 lb

## 2021-04-13 DIAGNOSIS — R1033 Periumbilical pain: Secondary | ICD-10-CM

## 2021-04-13 DIAGNOSIS — R634 Abnormal weight loss: Secondary | ICD-10-CM

## 2021-04-13 NOTE — Progress Notes (Signed)
04/13/2021 Cindy Meza [id number] [birth year]   Chief Complaint: abdominal pain, weight loss and rectal bleeding x1  History of Present Illness: Cindy Meza. Cindy Meza is a 85 year old female with a past medical history of asthma, chronic anemia, osteopenia and colon polyps.  She is followed by Dr. [name]. She continues to have central abdominal pain which fluctuates throughout her abdomen occurs 4 to 5 days weekly and last for few minutes. Eating does not trigger her pain. She sometimes has central abdominal pain upon awakening in the morning. Her variable abdominal pains do not interfere with her sleep. Weight today 100 lbs, prior 3 months she weighed 103lbs.  She reports losing 10 to 15 lbs over the past year. No fever,  sweats or chills. She does not skip meals.  Her appetite is good.  She snacks on pretzels, chips and drinks Ensure one bottle daily. She is taking Miralax as needed which results in passing a normal formed stool once every day or every other day.  8 days ago she strained to pass a bowel movement and she noticed a small amount of red blood on the toilet tissue which she attributed to having hemorrhoids.  She had very mild anal discomfort at this time.  No further straining or rectal bleeding since then.  He underwent an abdominal CTAP 09/28/2019 due to having periumbilical abdominal pain and weight loss which was unrevealing.  Her most recent colonoscopy was 09/05/2017 which showed internal hemorrhoids otherwise was normal.  No further colonoscopies recommended due to age.  She has chronic normocytic anemia.  Her most recent hemoglobin level was 11.1 which is close to her baseline for the past 3 years and Dr. Alain Marion ordered a repeat laboratory studies including a CBC, iron, TIBC, ferritin and CMP level.  No other complaints at this time.  CBC Latest Ref Rng & Units 03/10/2021 11/15/2020 09/16/2019  WBC 4.0 - 10.5 K/uL 7.1 5.9 5.9  Hemoglobin 12.0 - 15.0 g/dL 11.1(L) 10.9(L) 11.1(L)   Hematocrit 36.0 - 46.0 % 33.6(L) 33.4(L) 33.2(L)  Platelets 150.0 - 400.0 K/uL 216.0 233 266.0    CMP Latest Ref Rng & Units 03/10/2021 11/11/2020 07/14/2020  Glucose 70 - 99 mg/dL 118(H) 110(H) 97  BUN 6 - 23 mg/dL 17 17 14   Creatinine 0.40 - 1.20 mg/dL 0.96 1.05 0.87  Sodium 135 - 145 mEq/L 137 140 141  Potassium 3.5 - 5.1 mEq/L 3.8 3.8 3.7  Chloride 96 - 112 mEq/L 102 105 105  CO2 19 - 32 mEq/L 29 28 22   Calcium 8.4 - 10.5 mg/dL 9.6 9.4 8.8  Total Protein 6.0 - 8.3 g/dL 7.3 - -  Total Bilirubin 0.2 - 1.2 mg/dL 0.4 - -  Alkaline Phos 39 - 117 U/L 38(L) - -  AST 0 - 37 U/L 19 - -  ALT 0 - 35 U/L 12 - -      Past Medical History:  Diagnosis Date   Allergy    Anemia    Asthma    Dr. Melvyn Novas   Blood transfusion without reported diagnosis    Breast cyst    Left   Cataract    Dr. Gershon Crane   Complication of anesthesia    ' I HAD HALLUCINATIONS WHEN THEY DID MY HIP "   GERD (gastroesophageal reflux disease)    Heart murmur    Hemorrhoids    History of colonic polyps [facility]   hx of adenomas (small) 1998   OA (osteoarthritis)    Orthostatic hypotension  Osteopenia    PAC (premature atrial contraction)    Vitamin D deficiency     Current Medications, Allergies, Past Medical History, Past Surgical History, Family History and Social History were reviewed in [employer] record.   Review of Systems:   Constitutional: Negative for fever, sweats, chills or weight loss.  Respiratory: Negative for shortness of breath.   Cardiovascular: Negative for chest pain, palpitations and leg swelling.  Gastrointestinal: See HPI.  Musculoskeletal: Negative for back pain or muscle aches.  Neurological: Negative for dizziness, headaches or paresthesias.    Physical Exam: BP 110/60   Pulse 84   Ht 5\' 1"  (1.549 m)   Wt 100 lb (45.4 kg)   BMI 18.89 kg/m   Wt Readings from Last 3 Encounters:  04/13/21 100 lb (45.4 kg)  03/28/21 102 lb 3.2 oz (46.4 kg)  03/10/21 103  lb (46.7 kg)    General: Petite 85 year old female in no acute distress. Head: Normocephalic and atraumatic. Eyes: No scleral icterus. Conjunctiva pink . Ears: Normal auditory acuity. Lungs: Clear throughout to auscultation. Heart: Regular rate and rhythm, no murmur. Abdomen: Soft, nontender and nondistended. No masses or hepatomegaly. Normal bowel sounds x 4 quadrants.  Rectal: Deferred. Musculoskeletal: Symmetrical with no gross deformities. Extremities: No edema. Neurological: Alert oriented x 4. No focal deficits.  Psychological: Alert and cooperative. Normal mood and affect  Assessment and Recommendations:  42) 85 year old female with variable chronic upper, central and lower abdominal pain -IBgard 1 p.o. twice daily  2) Constipation -Benefiber 1 tablespoon daily and MiraLAX nightly as needed as needed/tolerated  3) Rectal bleeding x1 occasion after straining, likely due to history of internal hemorrhoid -Patient to call our office if rectal bleeding continues -Drink water 8 glasses daily -See plan in #2  4) Unintentional weight loss.  Patient has lost 3 pounds over the past 4 weeks.  -Increase Ensure 2 cans daily -Patient to call me in 2 weeks with home weight -Consider repeat CTAP if further weight loss occurs  5) Chronic anemia -Continue follow-up with Dr. Alain Marion including repeat CBC and iron studies as planned  6) Prior history of colon polyps.  No colon polyps per colonoscopy in 2019. -No further colon polyp surveillance colonoscopies due to age

## 2021-04-13 NOTE — Patient Instructions (Signed)
If you are age 83 or older, your body mass index should be between 23-30. Your Body mass index is 18.89 kg/m. If this is out of the aforementioned range listed, please consider follow up with your Primary Care Provider.  If you are age 66 or younger, your body mass index should be between 19-25. Your Body mass index is 18.89 kg/m. If this is out of the aformentioned range listed, please consider follow up with your Primary Care Provider.   RECOMMENDATIONS: IBgard 1 capsule twice a day as needed for abdominal pain. Benefiber- 1 tablespoon daily. Miralax- Dissolve one capful in 8 ounces of water and drink before bed. Call in 2 weeks with a weight update.  It was great seeing you today! Thank you for entrusting me with your care and choosing [facility].  Noralyn Pick, CRNP

## 2021-04-15 NOTE — Telephone Encounter (Signed)
Prolia VOB initiated via [email or web address]  Last OV:  Next OV:  Last Prolia inj: 11/11/20 Next Prolia inj DUE: 05/15/21

## 2021-04-19 NOTE — Telephone Encounter (Signed)
Pt ready for scheduling on or after 05/15/21  Out-of-pocket cost due at time of visit: $0.00  Primary: [employer] Medicare Prolia co-insurance:  Admin fee co-insurance:   Secondary: n/a Prolia co-insurance:  Admin fee co-insurance:   Deductible: $200 of $200 met  Prior Auth: not required PA# Valid:     ** This summary of benefits is an estimation of the patient's out-of-pocket cost. Exact cost may very based on individual plan coverage.

## 2021-04-20 ENCOUNTER — Telehealth: Payer: Self-pay | Admitting: Internal Medicine

## 2021-04-20 ENCOUNTER — Telehealth: Payer: Self-pay

## 2021-04-20 NOTE — Telephone Encounter (Signed)
Pt claims she received the injection on DOS 9/2, not DOS 5/6. Med was recorded for both days, but discontinued on 9/2 DOS. Please follow up with patient.

## 2021-04-20 NOTE — Telephone Encounter (Signed)
Error

## 2021-04-20 NOTE — Telephone Encounter (Signed)
Pt calling to request her most recent lab results mailed to her home. Pt contact [phone]

## 2021-04-20 NOTE — Telephone Encounter (Signed)
Patient advise that results will be mailed

## 2021-04-25 NOTE — Telephone Encounter (Signed)
Hi Dr. Kelton Pillar,  I saw documentation of Prolia inj for visit 11/11/20 but not for visit 03/10/21.     --------------------------------------------------------    As far as I can tell, documentation does not correlate with pt reported inj history. Please review and follow up, or have MA/LNP f/u with pt.   Please let me know if I am reading something incorrectly.   Thanks,   [place], [place]

## 2021-04-26 LAB — HM MAMMOGRAPHY

## 2021-04-26 NOTE — Telephone Encounter (Signed)
Patient was advised and understand that she will get her next Prolia injection 09/2020.

## 2021-04-27 ENCOUNTER — Telehealth: Payer: Self-pay | Admitting: Nurse Practitioner

## 2021-04-27 ENCOUNTER — Encounter: Payer: Self-pay | Admitting: Internal Medicine

## 2021-04-27 NOTE — Telephone Encounter (Signed)
Patient called to report weight update for the past 2 weeks:  10/10 -- 98 10/12 -- 97.5 and 99.5 10/13 -- 98 10/15 -- 99.5 10/18 -- 99 10/19 -- 100  Patient also states she is still having abdominal pain. Please call and advise.

## 2021-04-28 NOTE — Telephone Encounter (Signed)
Beth, pls contact the patient and schedule her for a follow up appointment with Dr. [name]. She had a CTAP done 09/2019 for the same sx (weight loss and abdominal pan) which was unrevealing. If she is having worse abdominal pain then send her to the lab for a BMP and schedule an abd/pelvic CT with oral and IV contrast a few days after labs done. THX

## 2021-04-28 NOTE — Telephone Encounter (Signed)
Cindy Meza, Cindy Meza has been weighing herself almost every day since seeing you. She started at 98lbs on her scales. As of 04/26/21, she reports her weight at 100lbs. We discussed in great detail her symptoms. It sounds like her symptoms are still present but less intense now. She is taking Prilosec every morning before breakfast, Pepcid every evening at her bedtime. She was confused on when to take the IBguard and was concerned it was causing nausea. We discussed taking it 30 minutes before her lunch, based on her report of feeling bloated after eating. She has found a different supplement drink called Premier Protein with 30 grams of protein. She is going to try this for a while. Continues to drink 40 oz of water daily and take her fiber powder.  Her follow up appointment with Dr [name] is 06/28/21. I will call her again next week. Is this okay?

## 2021-04-30 NOTE — Telephone Encounter (Signed)
Beth, Excellent update and yes great plan to call her in one week for follow up. Thx

## 2021-05-03 ENCOUNTER — Other Ambulatory Visit: Payer: Self-pay

## 2021-05-03 MED ORDER — VITAMIN B-12 1000 MCG SL SUBL: 1.0000 | SUBLINGUAL_TABLET | Freq: Every morning | SUBLINGUAL | 3 refills | Status: DC

## 2021-05-03 NOTE — Telephone Encounter (Signed)
Spoke with the patient. She is no better and no worse. Changing the way she is taking the IBguard to after meals if she is feeling bloated does not improve the bloating. She has some mornings that she awakens feeling nauseated. Her weight is stable between 98 and 100 lbs.   Changes we did make are to take the calcium supplement with lunch and supper, take the B12 with breakfast. This is on the off chance the calcium is causing any nausea. Breakfast is a very light meal for her.   She does ask if the Florinef could be the cause of her ongoing symptoms. She is unsure if there is any benefit or insight to be gained by a CT and asks that you help her to decide this.

## 2021-05-07 NOTE — Telephone Encounter (Addendum)
     April 26, 2021 [place], [place]     9:43 AM Note Patient was advised and understand that she will get her next Prolia injection 09/2020.      Shamleffer, Melanie Crazier, MD to [place]     8:51 AM  Next injection due 09/2020.Marland Kitchen Her last dose was in September, she denied getting one in 11/2020 so we gave her a dose in 03/2021    We need to make sure we properly document her injections, so we prevent certain communication gaps             Pt ready for scheduling on or after 05/15/21  Out-of-pocket cost due at time of visit: $0.00  Primary: UHC Medicare Prolia co-insurance: 0% Admin fee co-insurance: 0%   Secondary: n/a Prolia co-insurance:  Admin fee co-insurance:   Deductible: $200 of $200 met  Prior Auth: not required PA# Valid:     ** This summary of benefits is an estimation of the patient's out-of-pocket cost. Exact cost may very based on individual plan coverage.

## 2021-05-07 NOTE — Telephone Encounter (Signed)
Pt due for Prolia inj 09/08/21

## 2021-05-08 ENCOUNTER — Other Ambulatory Visit: Payer: Self-pay

## 2021-05-08 DIAGNOSIS — R110 Nausea: Secondary | ICD-10-CM

## 2021-05-08 DIAGNOSIS — K5909 Other constipation: Secondary | ICD-10-CM

## 2021-05-08 DIAGNOSIS — R634 Abnormal weight loss: Secondary | ICD-10-CM

## 2021-05-08 NOTE — Telephone Encounter (Signed)
Please advise on the question of her Florinef and if you think a CT will be useful. Thanks

## 2021-05-08 NOTE — Telephone Encounter (Signed)
Patient agrees to this plan of care. She admits she is anxious about possible causes including a fear of a tumor. CT abd/pelvis 05/23/21 at [facility]. Arrive 2:40 pm to  [address] Location. Go at least by the day before to pick up oral contrast solution and instructions. Come for labs here 2 to 3 days before appointment.

## 2021-05-08 NOTE — Telephone Encounter (Signed)
Cindy Meza, for reassurance I would go ahead and schedule and abd/pelvic CT scan with oral and IV contrast. She will need a BMP a few days prior to proceeding the the CTAP. DX : Central abd pain, weigh loss and nausea. I don't think the Florinef is causing her sx but she can discuss further with her PCP. Thx

## 2021-05-15 ENCOUNTER — Telehealth: Payer: Self-pay | Admitting: Nurse Practitioner

## 2021-05-15 NOTE — Telephone Encounter (Signed)
Inbound call from patient requesting a call from a a nurse please in regards to upcoming CT scan.

## 2021-05-15 NOTE — Telephone Encounter (Signed)
Returned patient's call, answered her questions about labs that need to be done before her CT on 05/23/21, instructions for the contrast and that she can pick-up the 2 bottles of contrast at the front desk on the 3rd floor. I will put them up there.

## 2021-05-17 ENCOUNTER — Other Ambulatory Visit (INDEPENDENT_AMBULATORY_CARE_PROVIDER_SITE_OTHER): Payer: Medicare Other

## 2021-05-17 DIAGNOSIS — R634 Abnormal weight loss: Secondary | ICD-10-CM

## 2021-05-17 LAB — BASIC METABOLIC PANEL
BUN: 20 mg/dL (ref 6–23)
CO2: 30 mEq/L (ref 19–32)
Calcium: 9 mg/dL (ref 8.4–10.5)
Chloride: 103 mEq/L (ref 96–112)
Creatinine, Ser: 1.06 mg/dL (ref 0.40–1.20)
GFR: 48.64 mL/min — ABNORMAL LOW (ref >60.00)
Glucose, Bld: 134 mg/dL — ABNORMAL HIGH (ref 70–99)
Potassium: 3.7 mEq/L (ref 3.5–5.1)
Sodium: 136 mEq/L (ref 135–145)

## 2021-05-19 ENCOUNTER — Other Ambulatory Visit: Payer: Self-pay | Admitting: Internal Medicine

## 2021-05-23 ENCOUNTER — Other Ambulatory Visit: Payer: Medicare Other

## 2021-05-23 ENCOUNTER — Ambulatory Visit
Admission: RE | Admit: 2021-05-23 | Discharge: 2021-05-23 | Disposition: A | Discharge status: Home / Self Care | Payer: Medicare Other | Source: Ambulatory Visit | Attending: Nurse Practitioner | Admitting: Nurse Practitioner

## 2021-05-23 DIAGNOSIS — R110 Nausea: Secondary | ICD-10-CM

## 2021-05-23 DIAGNOSIS — R634 Abnormal weight loss: Secondary | ICD-10-CM

## 2021-05-23 DIAGNOSIS — K5909 Other constipation: Secondary | ICD-10-CM

## 2021-05-23 MED ORDER — IOPAMIDOL (ISOVUE-300) INJECTION 61%
100.0000 mL | Freq: Once | INTRAVENOUS | Status: AC | PRN
  Administered 2021-05-23: 100 mL via INTRAVENOUS

## 2021-05-29 ENCOUNTER — Telehealth: Payer: Self-pay

## 2021-05-29 NOTE — Telephone Encounter (Signed)
Notified patient of message from Lee , patient expressed understanding and agreement, no further questions at this time.

## 2021-05-29 NOTE — Telephone Encounter (Signed)
-----   Message from Noralyn Pick, NP sent at 05/25/2021  3:43 PM EST ----- Lenna Sciara please contact the patient and let her know her CTAP showed a moderate amount of stool in the colon and I would recommend for her to take MiraLAX nightly as tolerated.  She has taken MiraLAX as needed in the past.    No other significant abnormalities were identified.  Patient to contact our office in a week or 2 if her symptoms do not improve.  Thank you

## 2021-06-14 NOTE — Telephone Encounter (Signed)
Patient calling to provide office with an update please call her to discuss.

## 2021-06-16 NOTE — Telephone Encounter (Signed)
I called the patient. She is no longer constipated, she is taking Miralax daily and Benefiber daily.  She is a passing a BM once daily, she might skip one day then might pass 2 stools in one day. She has less gas discomfort. Weight 97 to 100 lbs. CTAP showed a large amount of stool otherwise no concerning intra-abdominal/pelvic pathology.  She has appointment to see her PCP in the next few weeks as well as Dr. [name] later in December.

## 2021-06-27 ENCOUNTER — Encounter: Payer: Self-pay | Admitting: Internal Medicine

## 2021-06-27 ENCOUNTER — Other Ambulatory Visit: Payer: Self-pay

## 2021-06-27 ENCOUNTER — Ambulatory Visit (INDEPENDENT_AMBULATORY_CARE_PROVIDER_SITE_OTHER): Payer: Medicare Other | Admitting: Internal Medicine

## 2021-06-27 DIAGNOSIS — K5903 Drug induced constipation: Secondary | ICD-10-CM

## 2021-06-27 DIAGNOSIS — Z681 Body mass index (BMI) 19 or less, adult: Secondary | ICD-10-CM | POA: Diagnosis not present

## 2021-06-27 DIAGNOSIS — R1030 Lower abdominal pain, unspecified: Secondary | ICD-10-CM | POA: Diagnosis not present

## 2021-06-27 MED ORDER — MEGESTROL ACETATE 40 MG PO TABS: 40.0000 mg | ORAL_TABLET | Freq: Every day | ORAL | 5 refills | Status: DC

## 2021-06-27 NOTE — Progress Notes (Signed)
Subjective:  Patient ID: Cindy Meza, female    DOB: May 03, 1936  Age: 85 y.o. MRN: [id number]  CC: Follow-up (3 MONTH F/U)   HPI Cindy Meza presents for abd pain - pt saw Dr [name]; CT w/constipation - using Miralax and Benefiber - better now F/u HTN, asthma C/o low BMI    Outpatient Medications Prior to Visit  Medication Sig Dispense Refill   ADVAIR HFA 115-21 MCG/ACT inhaler INHALE 2 PUFFS INTO THE LUNGS TWICE DAILY 12 g 3   aspirin 81 MG tablet Take 81 mg by mouth daily.     Calcium-Magnesium-Vitamin D (CALCIUM 500 PO) Take 1 tablet by mouth 2 (two) times daily with a meal. With lunch and supper     cholecalciferol (VITAMIN D3) 25 MCG (1000 UNIT) tablet Take 1,000 Units by mouth daily.     [employer] OMEGA-3 KRILL OIL 500 MG CAPS Take 500 capsules by mouth daily.     Cyanocobalamin (VITAMIN B-12) 1000 MCG SUBL Place 1 tablet (1,000 mcg total) under the tongue in the morning. 100 tablet 3   denosumab (PROLIA) 60 MG/ML SOSY injection Inject 60 mg into the skin every 6 (six) months.     famotidine (PEPCID) 20 MG tablet Take 20 mg by mouth at bedtime.     fludrocortisone (FLORINEF) 0.1 MG tablet Take 0.5 tablets (0.05 mg total) by mouth daily. 45 tablet 3   loratadine (CLARITIN) 10 MG tablet Take 10 mg by mouth daily.     omeprazole (PRILOSEC) 40 MG capsule TAKE 1 CAPSULE(40 MG) BY MOUTH DAILY BEFORE AND BREAKFAST 90 capsule 1   polyethylene glycol (MIRALAX / GLYCOLAX) 17 g packet Take 17 g by mouth daily as needed.     No facility-administered medications prior to visit.    ROS: Review of Systems  Constitutional:  Positive for fatigue and unexpected weight change. Negative for activity change, appetite change and chills.  HENT:  Negative for congestion, mouth sores and sinus pressure.   Eyes:  Negative for visual disturbance.  Respiratory:  Negative for cough and chest tightness.   Gastrointestinal:  Negative for abdominal pain and nausea.  Genitourinary:  Negative  for difficulty urinating, frequency and vaginal pain.  Musculoskeletal:  Negative for back pain and gait problem.  Skin:  Negative for pallor and rash.  Neurological:  Negative for dizziness, tremors, weakness, numbness and headaches.  Psychiatric/Behavioral:  Negative for confusion and sleep disturbance.    Objective:  BP 134/70 (BP Location: Left Arm)    Pulse 76    Temp 97.6 F (36.4 C) (Oral)    Ht 5\' 1"  (1.549 m)    Wt 98 lb (44.5 kg)    SpO2 99%    BMI 18.52 kg/m   BP Readings from Last 3 Encounters:  06/27/21 134/70  04/13/21 110/60  03/28/21 120/72    Wt Readings from Last 3 Encounters:  06/27/21 98 lb (44.5 kg)  04/13/21 100 lb (45.4 kg)  03/28/21 102 lb 3.2 oz (46.4 kg)    Physical Exam Constitutional:      General: She is not in acute distress.    Appearance: She is well-developed. She is not ill-appearing or toxic-appearing.  HENT:     Head: Normocephalic.     Right Ear: External ear normal.     Left Ear: External ear normal.     Nose: Nose normal.  Eyes:     General:        Right eye: No discharge.  Left eye: No discharge.     Conjunctiva/sclera: Conjunctivae normal.     Pupils: Pupils are equal, round, and reactive to light.  Neck:     Thyroid: No thyromegaly.     Vascular: No JVD.     Trachea: No tracheal deviation.  Cardiovascular:     Rate and Rhythm: Normal rate and regular rhythm.     Heart sounds: Normal heart sounds.  Pulmonary:     Effort: No respiratory distress.     Breath sounds: No stridor. No wheezing.  Abdominal:     General: Bowel sounds are normal. There is no distension.     Palpations: Abdomen is soft. There is no mass.     Tenderness: There is no abdominal tenderness. There is no guarding or rebound.  Musculoskeletal:        General: No tenderness.     Cervical back: Normal range of motion and neck supple. No rigidity.  Lymphadenopathy:     Cervical: No cervical adenopathy.  Skin:    Findings: No erythema or rash.   Neurological:     Cranial Nerves: No cranial nerve deficit.     Motor: No abnormal muscle tone.     Coordination: Coordination normal.     Deep Tendon Reflexes: Reflexes normal.  Psychiatric:        Behavior: Behavior normal.        Thought Content: Thought content normal.        Judgment: Judgment normal.   Thin  Lab Results  Component Value Date   WBC 7.1 03/10/2021   HGB 11.1 (L) 03/10/2021   HCT 33.6 (L) 03/10/2021   PLT 216.0 03/10/2021   GLUCOSE 134 (H) 05/17/2021   CHOL 246 (H) 11/15/2020   TRIG 49 11/15/2020   HDL 109 11/15/2020   LDLDIRECT 104.0 03/17/2013   LDLCALC 129 (H) 11/15/2020   ALT 12 03/10/2021   AST 19 03/10/2021   NA 136 05/17/2021   K 3.7 05/17/2021   CL 103 05/17/2021   CREATININE 1.06 05/17/2021   BUN 20 05/17/2021   CO2 30 05/17/2021   TSH 1.29 03/10/2021   INR 1.04 06/12/2016   HGBA1C 4.9 04/09/2017    CT ABDOMEN PELVIS W CONTRAST  Result Date: 05/24/2021 CLINICAL DATA:  Chronic constipation and nausea. EXAM: CT ABDOMEN AND PELVIS WITH CONTRAST TECHNIQUE: Multidetector CT imaging of the abdomen and pelvis was performed using the standard protocol following bolus administration of intravenous contrast. CONTRAST:  150mL ISOVUE-300 IOPAMIDOL (ISOVUE-300) INJECTION 61% COMPARISON:  September 28, 2019 FINDINGS: Lower chest: No acute abnormality. Hepatobiliary: No suspicious hepatic lesion. Gallbladder is unremarkable. No biliary ductal dilation. Pancreas: No pancreatic ductal dilation or evidence of acute inflammation. Spleen: Normal in size without focal abnormality. Adrenals/Urinary Tract: Bilateral adrenal glands are unremarkable. No hydronephrosis. No solid enhancing renal mass. Urinary bladder is unremarkable for degree of distension. Stomach/Bowel: Radiopaque enteric contrast traverses the rectum. Small hiatal hernia otherwise the stomach is unremarkable for degree of distension. The appendix is not confidently identified however there is no pericecal  inflammation. No pathologic dilation of small or large bowel. Terminal ileum appears normal. Moderate stool burden, suggestive of constipation. No evidence of acute bowel inflammation. Vascular/Lymphatic: Aortic and branch vessel atherosclerosis without abdominal aortic aneurysm. No pathologically enlarged abdominal or pelvic lymph nodes. Reproductive: Status post hysterectomy. No adnexal masses. Other: No abdominopelvic ascites. Musculoskeletal: Partially visualized right femoral fixation hardware. Diffuse demineralization of bone. Thoracolumbar spondylosis. No acute osseous abnormality. IMPRESSION: 1. Moderate stool burden, suggestive of constipation.  2. Small hiatal hernia. 3. Aortic Atherosclerosis (ICD10-I70.0). Electronically Signed   By: Dahlia Bailiff M.D.   On: 05/24/2021 10:57    Assessment & Plan:   Problem List Items Addressed This Visit     Abdominal pain     Recurrent abd pain - pt saw Dr [name]; CT w/constipation - using Miralax and Benefiber - better now      Relevant Orders   Comprehensive metabolic panel   CBC with Differential/Platelet   BMI less than 19,adult    BMI 18.5 Start Megace po      Relevant Orders   TSH   Lipid panel   Constipation     Recurrent abd pain - pt saw Dr [name]; CT w/constipation - using Miralax and Benefiber - better now         Meds ordered this encounter  Medications   megestrol (MEGACE) 40 MG tablet    Sig: Take 1 tablet (40 mg total) by mouth daily.    Dispense:  30 tablet    Refill:  5      Follow-up: Return in about 3 months (around 09/25/2021) for a follow-up visit.  [name], MD

## 2021-06-27 NOTE — Assessment & Plan Note (Signed)
BMI 18.5 Start Megace po

## 2021-06-27 NOTE — Assessment & Plan Note (Signed)
Recurrent abd pain - pt saw Dr [name]; CT w/constipation - using Miralax and Benefiber - better now

## 2021-06-28 ENCOUNTER — Ambulatory Visit (INDEPENDENT_AMBULATORY_CARE_PROVIDER_SITE_OTHER): Payer: Medicare Other | Admitting: Internal Medicine

## 2021-06-28 ENCOUNTER — Encounter: Payer: Self-pay | Admitting: Internal Medicine

## 2021-06-28 VITALS — BP 126/80 | HR 76 | Ht 61.0 in | Wt 98.6 lb

## 2021-06-28 DIAGNOSIS — K5909 Other constipation: Secondary | ICD-10-CM | POA: Diagnosis not present

## 2021-06-28 DIAGNOSIS — R1033 Periumbilical pain: Secondary | ICD-10-CM

## 2021-06-28 DIAGNOSIS — R634 Abnormal weight loss: Secondary | ICD-10-CM

## 2021-06-28 NOTE — Progress Notes (Signed)
Cindy Meza 85 y.o. 1935/10/31 [id number]  Assessment & Plan:   Encounter Diagnoses  Name Primary?   Chronic constipation Yes   Periumbilical abdominal pain    Loss of weight      She is improved on all fronts.  She should continue her MiraLAX and Benefiber and her PPI and H2 blocker.  See me as needed.  Subjective:   Chief Complaint: Abdominal pain weight loss constipation  HPI The patient is an 85 year old woman here for follow-up she was seen several times over the last 1 to 2 years with periumbilical abdominal pain and constipation.  She had a CT scan which was unrevealing.  Her weight fluctuates between 97 and 100 pounds she tells me.  She is on a regimen of MiraLAX and Benefiber that is alleviating constipation and she has only minimal periumbilical abdominal pain now.  She saw Dr. Alain Marion yesterday and he is prescribing Megace to try to stimulate appetite.  Review of systems positive for nocturia minor back pain and slight orthostasis symptoms.  She remains on midodrine.  Wt Readings from Last 3 Encounters:  06/28/21 98 lb 9.6 oz (44.7 kg)  06/27/21 98 lb (44.5 kg)  04/13/21 100 lb (45.4 kg)  CT abd/pelvis IMPRESSION: 1. Moderate stool burden, suggestive of constipation. 2. Small hiatal hernia. 3. Aortic Atherosclerosis (ICD10-I70.0).     Electronically Signed   By: Dahlia Bailiff M.D.   On: 05/24/2021 10:57  Allergies  Allergen Reactions   Fosamax [Alendronate Sodium]     Paresthesias, abd pain   Gabapentin     dizziness   Lidocaine Swelling    Of face and lips.  Novocaine does same thing.   Midodrine     Nausea    Risedronate Sodium     Knee pain   Shellfish Allergy Hives   Statins     REACTION: pains   Current Meds  Medication Sig   ADVAIR HFA 115-21 MCG/ACT inhaler INHALE 2 PUFFS INTO THE LUNGS TWICE DAILY   aspirin 81 MG tablet Take 81 mg by mouth daily.   Calcium-Magnesium-Vitamin D (CALCIUM 500 PO) Take 1 tablet by mouth 2 (two)  times daily with a meal. With lunch and supper   cholecalciferol (VITAMIN D3) 25 MCG (1000 UNIT) tablet Take 1,000 Units by mouth daily.   [employer]S OMEGA-3 KRILL OIL 500 MG CAPS Take 500 capsules by mouth daily.   Cyanocobalamin (VITAMIN B-12) 1000 MCG SUBL Place 1 tablet (1,000 mcg total) under the tongue in the morning.   denosumab (PROLIA) 60 MG/ML SOSY injection Inject 60 mg into the skin every 6 (six) months.   famotidine (PEPCID) 20 MG tablet Take 20 mg by mouth at bedtime.   fludrocortisone (FLORINEF) 0.1 MG tablet Take 0.5 tablets (0.05 mg total) by mouth daily.   loratadine (CLARITIN) 10 MG tablet Take 10 mg by mouth daily.   megestrol (MEGACE) 40 MG tablet Take 1 tablet (40 mg total) by mouth daily.   omeprazole (PRILOSEC) 40 MG capsule TAKE 1 CAPSULE(40 MG) BY MOUTH DAILY BEFORE AND BREAKFAST   polyethylene glycol (MIRALAX / GLYCOLAX) 17 g packet Take 17 g by mouth daily as needed.   Past Medical History:  Diagnosis Date   Allergy    Anemia    Asthma    Dr. Melvyn Novas   Blood transfusion without reported diagnosis    Breast cyst    Left   Cataract    Dr. Gershon Crane   Complication of anesthesia    '  I HAD HALLUCINATIONS WHEN THEY DID MY HIP "   GERD (gastroesophageal reflux disease)    Heart murmur    Hemorrhoids    History of colonic polyps Kerie Badger   hx of adenomas (small) 1998   OA (osteoarthritis)    Orthostatic hypotension    Osteopenia    PAC (premature atrial contraction)    Vitamin D deficiency    Past Surgical History:  Procedure Laterality Date   ABDOMINAL HYSTERECTOMY     complete   APPENDECTOMY     BREAST CYST EXCISION     CATARACT EXTRACTION, BILATERAL     04/23/18 left eye, 04/02/18 right eye   COLONOSCOPY  Multiple   Adenomatous colon polyps   ESOPHAGOGASTRODUODENOSCOPY  2010   INTRAMEDULLARY (IM) NAIL INTERTROCHANTERIC Right 06/13/2016   Procedure: INTRAMEDULLARY (IM) NAIL INTERTROCHANTRIC;  Surgeon: Tania Ade, MD;  Location: [facility];  Service:  Orthopedics;  Laterality: Right;   TONSILLECTOMY     Social History   Social History Narrative   She lives alone in a [place] home.  No children.   Retired Education officer, museum.   Highest level of education:  Masters degree   family history includes Diabetes (age of onset: 37) in her mother; Hypertension in an other family member.   Review of Systems As per HPI  Objective:   Physical Exam BP 126/80    Pulse 76    Ht 5\' 1"  (1.549 m)    Wt 98 lb 9.6 oz (44.7 kg)    BMI 18.63 kg/m

## 2021-06-28 NOTE — Patient Instructions (Addendum)
If you are age 83 or older, your body mass index should be between 23-30. Your Body mass index is 18.63 kg/m. If this is out of the aforementioned range listed, please consider follow up with your Primary Care Provider.  If you are age 33 or younger, your body mass index should be between 19-25. Your Body mass index is 18.63 kg/m. If this is out of the aformentioned range listed, please consider follow up with your Primary Care Provider.   ________________________________________________________  The [facility] providers would like to encourage you to use [facility] to communicate with providers for non-urgent requests or questions.  Due to long hold times on the telephone, sending your provider a message by [facility] may be a faster and more efficient way to get a response.  Please allow 48 business hours for a response.  Please remember that this is for non-urgent requests.  _______________________________________________________  Continue your omeprazole and generic pepcid per Dr [name].  I appreciate the opportunity to care for you. Silvano Rusk, MD, [facility]  .

## 2021-07-11 NOTE — Progress Notes (Signed)
Cardiology Office Note   Date:  07/12/2021   ID:  Cindy Meza, DOB [birth year], MRN [id number]  PCP:  Cassandria Anger, MD  Cardiologist:   Dorris Carnes, MD   Pt presents for f/u of orthostatic hypotension    History of Present Illness: Cindy Meza is a 86 y.o. female with a history  fatigue and orthostatic intolerance.  ALso a hx of mitral regurgitation, asthma, GERD   Myovue in 2017 and 2019 were normal   Echo with LVEF 50 to 55% with mild diastolic dysfunction and mod MR   SHe has been taking Crestor 1x per week   Cut back due to consitpation  I saw the pt in clinic in early Dec 2021  She called a couple weeks later complaining of shakiness, lightheadnedness   REcomm increasing salt and increasing florinef to bid  She called back in Feb complaining of feeling bad again   Wanted to go back to 1/2 tab daily  ALso cut out Crestor  Then back up to 2x daily florinef    The pt is back to daily florinef since last spring  I saw her in May 2022  Since seen she is doing OK   Rare dizziness    BP at home 90 to 120s    Denies CP    Outpatient Medications Prior to Visit  Medication Sig Dispense Refill   ADVAIR HFA 115-21 MCG/ACT inhaler INHALE 2 PUFFS INTO THE LUNGS TWICE DAILY 12 g 3   aspirin 81 MG tablet Take 81 mg by mouth daily.     Calcium-Magnesium-Vitamin D (CALCIUM 500 PO) Take 1 tablet by mouth 2 (two) times daily with a meal. With lunch and supper     cholecalciferol (VITAMIN D3) 25 MCG (1000 UNIT) tablet Take 1,000 Units by mouth daily.     CVS OMEGA-3 KRILL OIL 500 MG CAPS Take 500 capsules by mouth daily.     Cyanocobalamin (VITAMIN B-12) 1000 MCG SUBL Place 1 tablet (1,000 mcg total) under the tongue in the morning. 100 tablet 3   denosumab (PROLIA) 60 MG/ML SOSY injection Inject 60 mg into the skin every 6 (six) months.     famotidine (PEPCID) 20 MG tablet Take 20 mg by mouth at bedtime.     fludrocortisone (FLORINEF) 0.1 MG tablet Take 0.5 tablets (0.05 mg  total) by mouth daily. 45 tablet 3   loratadine (CLARITIN) 10 MG tablet Take 10 mg by mouth daily.     megestrol (MEGACE) 40 MG tablet Take 1 tablet (40 mg total) by mouth daily. 30 tablet 5   omeprazole (PRILOSEC) 40 MG capsule TAKE 1 CAPSULE(40 MG) BY MOUTH DAILY BEFORE AND BREAKFAST 90 capsule 1   polyethylene glycol (MIRALAX / GLYCOLAX) 17 g packet Take 17 g by mouth daily as needed.     No facility-administered medications prior to visit.     Allergies:   Fosamax [alendronate sodium], Gabapentin, Lidocaine, Midodrine, Risedronate sodium, Shellfish allergy, and Statins   Past Medical History:  Diagnosis Date   Allergy    Anemia    Asthma    Dr. Melvyn Novas   Blood transfusion without reported diagnosis    Breast cyst    Left   Cataract    Dr. Gershon Crane   Complication of anesthesia    ' I HAD HALLUCINATIONS WHEN THEY DID MY HIP "   GERD (gastroesophageal reflux disease)    Heart murmur    Hemorrhoids    History of colonic polyps  [facility]   hx of adenomas (small) 1998   OA (osteoarthritis)    Orthostatic hypotension    Osteopenia    PAC (premature atrial contraction)    Vitamin D deficiency     Past Surgical History:  Procedure Laterality Date   ABDOMINAL HYSTERECTOMY     complete   APPENDECTOMY     BREAST CYST EXCISION     CATARACT EXTRACTION, BILATERAL     04/23/18 left eye, 04/02/18 right eye   COLONOSCOPY  Multiple   Adenomatous colon polyps   ESOPHAGOGASTRODUODENOSCOPY  2010   INTRAMEDULLARY (IM) NAIL INTERTROCHANTERIC Right 06/13/2016   Procedure: INTRAMEDULLARY (IM) NAIL INTERTROCHANTRIC;  Surgeon: Tania Ade, MD;  Location: [facility];  Service: Orthopedics;  Laterality: Right;   TONSILLECTOMY       Social History:  The patient  reports that she has never smoked. She has never used smokeless tobacco. She reports that she does not currently use alcohol. She reports that she does not use drugs.   Family History:  The patient's family history includes Diabetes (age  of onset: 69) in her mother; Hypertension in an other family member.  No history of CAD known    Mother had PPM    ROS:  Please see the history of present illness. All other systems are reviewed and  Negative to the above problem except as noted.    PHYSICAL EXAM: VS:  BP 140/74    Pulse 87    Ht 5\' 2"  (1.575 m)    Wt 98 lb 6.4 oz (44.6 kg)    BMI 18.00 kg/m     GEN: Thin 86 year old in no acute distress  HEENT: normal  Neck: JVP is normal  No carotid bruits Cardiac: RRR; no murmurs.  No LE  edema  Respiratory:  clear to auscultation   GI: soft, nontender, nondistended, + BS  No hepatomegaly  MS: no deformity Moving all extremities   Skin: warm and dry, no rash Neuro:  Strength and sensation are intact Psych: euthymic mood, full affect   EKG:  EKG is ordered   SR with PAC   LVH with repolarization abnormality   87 bpm  Lipid Panel    Component Value Date/Time   CHOL WILL FOLLOW 07/12/2021 1222   TRIG WILL FOLLOW 07/12/2021 1222   HDL WILL FOLLOW 07/12/2021 1222   CHOLHDL WILL FOLLOW 07/12/2021 1222   CHOLHDL 2 04/10/2018 1207   VLDL 13.6 04/10/2018 1207   LDLCALC WILL FOLLOW 07/12/2021 1222   LDLDIRECT 104.0 03/17/2013 1026      Wt Readings from Last 3 Encounters:  07/12/21 98 lb 6.4 oz (44.6 kg)  06/28/21 98 lb 9.6 oz (44.7 kg)  06/27/21 98 lb (44.5 kg)      ASSESSMENT AND PLAN:  1  Orthostatic hypotension  Pt is clinically improved    Keep on current regimen   Stay hydrated    2  CAD Plaquing of aorta and coronary arteries  Normal myovue in Dec 2019 Continues to have no symptoms    On ASA    3 Hyperlipidemia\ Pt not on a statin   Had been achy   LDL 129  HDL 109     F/U fall /winter 2023  Current medicines are reviewed at length with the patient today.  The patient does not have concerns regarding medicines.   Signed, Dorris Carnes, MD  07/12/2021 10:01 PM    [address], [place], [place]  [address] Phone: [phone])  [phone];  Fax: [phone]

## 2021-07-12 ENCOUNTER — Other Ambulatory Visit: Payer: Self-pay

## 2021-07-12 ENCOUNTER — Encounter: Payer: Self-pay | Admitting: Internal Medicine

## 2021-07-12 ENCOUNTER — Ambulatory Visit (INDEPENDENT_AMBULATORY_CARE_PROVIDER_SITE_OTHER): Payer: Medicare Other | Admitting: Internal Medicine

## 2021-07-12 VITALS — BP 140/74 | HR 87 | Ht 62.0 in | Wt 98.4 lb

## 2021-07-12 DIAGNOSIS — Z79899 Other long term (current) drug therapy: Secondary | ICD-10-CM | POA: Diagnosis not present

## 2021-07-12 DIAGNOSIS — I951 Orthostatic hypotension: Secondary | ICD-10-CM

## 2021-07-12 DIAGNOSIS — I2510 Atherosclerotic heart disease of native coronary artery without angina pectoris: Secondary | ICD-10-CM

## 2021-07-12 DIAGNOSIS — E559 Vitamin D deficiency, unspecified: Secondary | ICD-10-CM | POA: Diagnosis not present

## 2021-07-12 DIAGNOSIS — E785 Hyperlipidemia, unspecified: Secondary | ICD-10-CM | POA: Diagnosis not present

## 2021-07-12 NOTE — Patient Instructions (Addendum)
Medication Instructions:  Your physician recommends that you continue on your current medications as directed. Please refer to the Current Medication list given to you today.  *If you need a refill on your cardiac medications before your next appointment, please call your pharmacy*   Lab Work: TIBC, Ferritin, Iron panel,  CMET, CBC, Lipid, TSH  If you have labs (blood work) drawn today and your tests are completely normal, you will receive your results only by: [place] (if you have MyChart) OR A paper copy in the mail If you have any lab test that is abnormal or we need to change your treatment, we will call you to review the results.   Testing/Procedures: none   Follow-Up: At [facility], you and your health needs are our priority.  As part of our continuing mission to provide you with exceptional heart care, we have created designated Provider Care Teams.  These Care Teams include your primary Cardiologist (physician) and Advanced Practice Providers (APPs -  Physician Assistants and Nurse Practitioners) who all work together to provide you with the care you need, when you need it.  We recommend signing up for the patient portal called "MyChart".  Sign up information is provided on this After Visit Summary.  MyChart is used to connect with patients for Virtual Visits (Telemedicine).  Patients are able to view lab/test results, encounter notes, upcoming appointments, etc.  Non-urgent messages can be sent to your provider as well.   To learn more about what you can do with MyChart, go to [email or web address].    Your next appointment:   1 year(s) ( Sept 2023)   The format for your next appointment:   In Person  Provider:   Dorris Carnes, MD     Other Instructions

## 2021-07-13 LAB — CBC
Hematocrit: 33.8 % — ABNORMAL LOW (ref 34.0–46.6)
Hemoglobin: 11.1 g/dL (ref 11.1–15.9)
MCH: 30.7 pg (ref 26.6–33.0)
MCHC: 32.8 g/dL (ref 31.5–35.7)
MCV: 94 fL (ref 79–97)
Platelets: 223 10*3/uL (ref 150–450)
RBC: 3.61 x10E6/uL — ABNORMAL LOW (ref 3.77–5.28)
RDW: 11.2 % — ABNORMAL LOW (ref 11.7–15.4)
WBC: 6.3 10*3/uL (ref 3.4–10.8)

## 2021-07-13 LAB — TSH: TSH: 2.31 u[IU]/mL (ref 0.450–4.500)

## 2021-07-13 LAB — COMPREHENSIVE METABOLIC PANEL
ALT: 12 IU/L (ref 0–32)
AST: 22 IU/L (ref 0–40)
Albumin/Globulin Ratio: 1.6 (ref 1.2–2.2)
Albumin: 4.2 g/dL (ref 3.6–4.6)
Alkaline Phosphatase: 40 IU/L — ABNORMAL LOW (ref 44–121)
BUN/Creatinine Ratio: 15 (ref 12–28)
BUN: 17 mg/dL (ref 8–27)
Bilirubin Total: 0.3 mg/dL (ref 0.0–1.2)
CO2: 25 mmol/L (ref 20–29)
Calcium: 9.5 mg/dL (ref 8.7–10.3)
Chloride: 104 mmol/L (ref 96–106)
Creatinine, Ser: 1.14 mg/dL — ABNORMAL HIGH (ref 0.57–1.00)
Globulin, Total: 2.7 g/dL (ref 1.5–4.5)
Glucose: 154 mg/dL — ABNORMAL HIGH (ref 70–99)
Potassium: 4.2 mmol/L (ref 3.5–5.2)
Sodium: 139 mmol/L (ref 134–144)
Total Protein: 6.9 g/dL (ref 6.0–8.5)
eGFR: 48 mL/min/{1.73_m2} — ABNORMAL LOW (ref >59)

## 2021-07-13 LAB — LIPID PANEL
Chol/HDL Ratio: 2.1 ratio (ref 0.0–4.4)
Cholesterol, Total: 227 mg/dL — ABNORMAL HIGH (ref 100–199)
HDL: 107 mg/dL (ref >39)
LDL Chol Calc (NIH): 112 mg/dL — ABNORMAL HIGH (ref 0–99)
Triglycerides: 45 mg/dL (ref 0–149)
VLDL Cholesterol Cal: 8 mg/dL (ref 5–40)

## 2021-07-13 LAB — IRON,TIBC AND FERRITIN PANEL
Ferritin: 195 ng/mL — ABNORMAL HIGH (ref 15–150)
Iron Saturation: 36 % (ref 15–55)
Iron: 90 ug/dL (ref 27–139)
Total Iron Binding Capacity: 251 ug/dL (ref 250–450)
UIBC: 161 ug/dL (ref 118–369)

## 2021-08-03 ENCOUNTER — Telehealth: Payer: Self-pay | Admitting: Internal Medicine

## 2021-08-04 ENCOUNTER — Ambulatory Visit (INDEPENDENT_AMBULATORY_CARE_PROVIDER_SITE_OTHER): Payer: Medicare Other | Admitting: Internal Medicine

## 2021-08-04 ENCOUNTER — Encounter: Payer: Self-pay | Admitting: Internal Medicine

## 2021-08-04 VITALS — BP 120/80 | HR 74 | Ht 62.0 in | Wt 98.6 lb

## 2021-08-04 DIAGNOSIS — M810 Age-related osteoporosis without current pathological fracture: Secondary | ICD-10-CM

## 2021-08-04 NOTE — Progress Notes (Signed)
Name: Cindy Meza  MRN/ DOB: [id number], [birth year]    Age/ Sex: 86 y.o., female     PCP: Cassandria Anger, MD   Reason for Endocrinology Evaluation: Osteoporosis     Initial Endocrinology Clinic Visit: 03/10/2020    PATIENT IDENTIFIER: Cindy Meza is a 86 y.o., female with a past medical history of OSTEOPOROSIS, aSTHMA AND cad. She has followed with [facility] since 03/10/2020 for consultative assistance with management of her osteoporosis.   HISTORICAL SUMMARY:   Pt was diagnosed with osteoporosis: 2018   Menarche at age : 76 Menopausal at age : in her 89's  Fracture Hx: Right hip fracture  Hx of HRT: yes  FH of osteoporosis or hip fracture: no Prior Hx of anti-estrogenic therapy : no  Prior Hx of anti-resorptive therapy : Intolerant to Fosamax - heel pain  Many years ago. Restarted ~ 2020 but stopped in 2021 due to abdominal pain.   First Prolia injection 05/05/2020   SUBJECTIVE:    Today (08/04/2021):  Cindy Meza is here for osteoporosis .  Last Prolia injection 03/2021 She is here to discuss weight loss . She has been evaluated by GI and PCP . Her labs and TFTs were unrevealing. CT scan was done . Has constipation and uses laxatives, which has helped   She read that constipation could be caused by Prolia She continues to have back pain  She is questioning if her weight loss is related to Prolia   Calcium 1 tabs BID Vitamin D3 1000 iu daily  Prolia 60 mg [place] Q 6 months       HISTORY:  Past Medical History:  Past Medical History:  Diagnosis Date   Allergy    Anemia    Asthma    Dr. Melvyn Novas   Blood transfusion without reported diagnosis    Breast cyst    Left   Cataract    Dr. Gershon Crane   Complication of anesthesia    ' I HAD HALLUCINATIONS WHEN THEY DID MY HIP "   GERD (gastroesophageal reflux disease)    Heart murmur    Hemorrhoids    History of colonic polyps Gessner   hx of adenomas (small) 1998   OA  (osteoarthritis)    Orthostatic hypotension    Osteopenia    PAC (premature atrial contraction)    Vitamin D deficiency    Past Surgical History:  Past Surgical History:  Procedure Laterality Date   ABDOMINAL HYSTERECTOMY     complete   APPENDECTOMY     BREAST CYST EXCISION     CATARACT EXTRACTION, BILATERAL     04/23/18 left eye, 04/02/18 right eye   COLONOSCOPY  Multiple   Adenomatous colon polyps   ESOPHAGOGASTRODUODENOSCOPY  2010   INTRAMEDULLARY (IM) NAIL INTERTROCHANTERIC Right 06/13/2016   Procedure: INTRAMEDULLARY (IM) NAIL INTERTROCHANTRIC;  Surgeon: Tania Ade, MD;  Location: [facility];  Service: Orthopedics;  Laterality: Right;   TONSILLECTOMY     Social History:  reports that she has never smoked. She has never used smokeless tobacco. She reports that she does not currently use alcohol. She reports that she does not use drugs. Family History:  Family History  Problem Relation Age of Onset   Diabetes Mother 69       Deceased   Hypertension Other    Colon cancer Neg Hx    Pancreatic cancer Neg Hx    Stomach cancer Neg Hx    Esophageal cancer Neg Hx  Rectal cancer Neg Hx      HOME MEDICATIONS: Allergies as of 08/04/2021       Reactions   Fosamax [alendronate Sodium]    Paresthesias, abd pain   Gabapentin    dizziness   Lidocaine Swelling   Of face and lips.  Novocaine does same thing.   Midodrine    Nausea   Risedronate Sodium    Knee pain   Shellfish Allergy Hives   Statins    REACTION: pains        Medication List        Accurate as of August 04, 2021  1:43 PM. If you have any questions, ask your nurse or doctor.          Advair HFA 115-21 MCG/ACT inhaler Generic drug: fluticasone-salmeterol INHALE 2 PUFFS INTO THE LUNGS TWICE DAILY   aspirin 81 MG tablet Take 81 mg by mouth daily.   CALCIUM 500 PO Take 1 tablet by mouth 2 (two) times daily with a meal. With lunch and supper   cholecalciferol 25 MCG (1000 UNIT) tablet Commonly  known as: VITAMIN D3 Take 1,000 Units by mouth daily.   CVS Omega-3 Krill Oil 500 MG Caps Take 500 capsules by mouth daily.   denosumab 60 MG/ML Sosy injection Commonly known as: PROLIA Inject 60 mg into the skin every 6 (six) months.   estradiol 0.1 MG/GM vaginal cream Commonly known as: ESTRACE Place vaginally.   famotidine 20 MG tablet Commonly known as: PEPCID Take 20 mg by mouth at bedtime.   fludrocortisone 0.1 MG tablet Commonly known as: FLORINEF Take 0.5 tablets (0.05 mg total) by mouth daily.   loratadine 10 MG tablet Commonly known as: CLARITIN Take 10 mg by mouth daily.   megestrol 40 MG tablet Commonly known as: MEGACE Take 1 tablet (40 mg total) by mouth daily.   omeprazole 40 MG capsule Commonly known as: PRILOSEC TAKE 1 CAPSULE(40 MG) BY MOUTH DAILY BEFORE AND BREAKFAST   polyethylene glycol 17 g packet Commonly known as: MIRALAX / GLYCOLAX Take 17 g by mouth daily as needed.   Vitamin B-12 1000 MCG Subl Place 1 tablet (1,000 mcg total) under the tongue in the morning.          OBJECTIVE:   PHYSICAL EXAM: VS: BP 120/80 (BP Location: Left Arm, Patient Position: Sitting, Cuff Size: Small)    Pulse 74    Ht _0  (1.575 m)    Wt 98 lb 9.6 oz (44.7 kg)    SpO2 96%    BMI 18.03 kg/m    EXAM: General: Pt appears well and is in NAD  Lungs: Clear with good BS bilat with no rales, rhonchi, or wheezes  Heart: Auscultation: RRR.  Extremities:  BL LE: No pretibial edema normal ROM and strength.  Mental Status: Judgment, insight: Intact Orientation: Oriented to time, place, and person Mood and affect: No depression, anxiety, or agitation     DATA REVIEWED:   Latest Reference Range & Units 07/12/21 12:22  Sodium 134 - 144 mmol/L 139  Potassium 3.5 - 5.2 mmol/L 4.2  Chloride 96 - 106 mmol/L 104  CO2 20 - 29 mmol/L 25  Glucose 70 - 99 mg/dL 154 (H)  BUN 8 - 27 mg/dL 17  Creatinine 0.57 - 1.00 mg/dL 1.14 (H)  Calcium 8.7 - 10.3 mg/dL 9.5   BUN/Creatinine Ratio 12 - 28  15  eGFR >59 mL/min/1.73 48 (L)  Alkaline Phosphatase 44 - 121 IU/L 40 (L)  Albumin 3.6 -  4.6 g/dL 4.2  Albumin/Globulin Ratio 1.2 - 2.2  1.6  AST 0 - 40 IU/L 22  ALT 0 - 32 IU/L 12  Total Protein 6.0 - 8.5 g/dL 6.9  Total Bilirubin 0.0 - 1.2 mg/dL 0.3    Latest Reference Range & Units 07/12/21 12:22  TSH 0.450 - 4.500 uIU/mL 2.310    Latest Reference Range & Units 03/10/21 15:24  VITD 30.00 - 100.00 ng/mL 62.93    ASSESSMENT / PLAN / RECOMMENDATIONS:   1. Osteoporosis :  - Started on prolia 04/2020, she denies  having a prolia injection in 11/2020, she  received a dose of prolia  03/2021, will be due 09/2021  -She is concerned about weight loss and wondering if prolia is causing this, I explained to the pt that I am not aware of weight loss being a side effect. TFT's are normal. She has constipation that she read that could be caused by prolia but this has resolved with benefiber  and miralax.  - I have offered her to stop Prolia and give her 1 dose of reclast due to multiple concerns , but she would like to continue for now  - She will schedule a bone density at [facility]    Medications  Continue calcium 500 mg twice daily Continue Vitamin D 1000 iu daily  Prolia 60 mg SQ every 6 months       Follow-up in 1 year  Signed electronically by: Mack Guise, MD  [facility]  [facility] [address] [place], [place] [address] Phone: [phone] FAX: [phone]      CC: Cassandria Anger, MD [place] [address] Phone: [phone]  Fax: [phone]   Return to Endocrinology clinic as below: Future Appointments  Date Time Provider [place]  09/08/2021  2:45 PM [facility] NURSE [facility] None  09/27/2021  2:20 PM [name], Evie Lacks, MD [facility] None  03/16/2022  2:00 PM Rosea Dory, Melanie Crazier, MD [facility] None

## 2021-08-04 NOTE — Patient Instructions (Addendum)
-   Continue Calcium tablets twice daily  - Continue Vitamin D 1000 iu daily     Please schedule Bone density at [facility] : [address], [place], [place] [address] Phone: [phone]   

## 2021-08-08 NOTE — Telephone Encounter (Signed)
Prolia appt set for 09/08/21 at 2:45.

## 2021-08-23 LAB — HM DEXA SCAN: HM Dexa Scan: -3.9

## 2021-08-24 ENCOUNTER — Encounter: Payer: Self-pay | Admitting: Internal Medicine

## 2021-08-24 NOTE — Telephone Encounter (Signed)
Bone density results 2023      2020 2023 Change from prior  AP spine -3.4 -2.3 Up 3%  LFN NA -2.6   LTH -2.4 -1.9 Up 9%  Distal 1/3 forearm NA -3.9        Distal one third of the forearm is a new site and there is no comparison   There has been improvement in her bone density at the AP spine and left total hip with resolution of osteoporosis in the sites  We will continue with Prolia  A letter will be sent to the patient   [name] Jordan, MD  [facility]  [facility] [address] [place], [place] [address] Phone: [place]: [phone]

## 2021-08-24 NOTE — Progress Notes (Addendum)
Subjective:   Cindy Meza is a 86 y.o. female who presents for Medicare Annual (Subsequent) preventive examination.  I connected with Cindy Meza today by telephone and verified that I am speaking with the correct person using two identifiers. Location patient: home Location provider: work Persons participating in the virtual visit: patient, [name].    I discussed the limitations, risks, security and privacy concerns of performing an evaluation and management service by telephone and the availability of in person appointments. I also discussed with the patient that there may be a patient responsible charge related to this service. The patient expressed understanding and verbally consented to this telephonic visit.    Interactive audio and video telecommunications were attempted between this provider and patient, however failed, due to patient having technical difficulties OR patient did not have access to video capability.  We continued and completed visit with audio only.  Some vital signs may be absent or patient reported.   Time Spent with patient on telephone encounter: 20 minutes  Review of Systems     Cardiac Risk Factors include: advanced age (>64men, >41 women)     Objective:    Today's Vitals   08/25/21 0852  Weight: 98 lb (44.5 kg)  Height: 5\' 2"  (1.575 m)   Body mass index is 17.92 kg/m.  Advanced Directives 08/25/2021 08/16/2020 08/08/2020 04/24/2018 06/14/2016  Does Patient Have a Medical Advance Directive? Yes Yes Yes Yes Yes  Type of Paramedic of [place];Living will [place];Living will [place];Living will [place];Living will [place]  Does patient want to make changes to medical advance directive? Yes (MAU/Ambulatory/Procedural Areas - Information given) No - Patient declined No - Patient declined - No - Patient declined  Copy of [place] in Chart? - No - copy requested No - copy requested - No - copy requested    Current Medications (verified) Outpatient Encounter Medications as of 08/25/2021  Medication Sig   ADVAIR HFA 115-21 MCG/ACT inhaler INHALE 2 PUFFS INTO THE LUNGS TWICE DAILY   aspirin 81 MG tablet Take 81 mg by mouth daily.   Calcium-Magnesium-Vitamin D (CALCIUM 500 PO) Take 1 tablet by mouth 2 (two) times daily with a meal. With lunch and supper   cholecalciferol (VITAMIN D3) 25 MCG (1000 UNIT) tablet Take 1,000 Units by mouth daily.   CVS OMEGA-3 KRILL OIL 500 MG CAPS Take 500 capsules by mouth daily.   Cyanocobalamin (VITAMIN B-12) 1000 MCG SUBL Place 1 tablet (1,000 mcg total) under the tongue in the morning.   denosumab (PROLIA) 60 MG/ML SOSY injection Inject 60 mg into the skin every 6 (six) months.   estradiol (ESTRACE) 0.1 MG/GM vaginal cream Place vaginally.   famotidine (PEPCID) 20 MG tablet Take 20 mg by mouth at bedtime.   fludrocortisone (FLORINEF) 0.1 MG tablet Take 0.5 tablets (0.05 mg total) by mouth daily.   loratadine (CLARITIN) 10 MG tablet Take 10 mg by mouth daily.   omeprazole (PRILOSEC) 40 MG capsule TAKE 1 CAPSULE(40 MG) BY MOUTH DAILY BEFORE AND BREAKFAST   polyethylene glycol (MIRALAX / GLYCOLAX) 17 g packet Take 17 g by mouth daily as needed.   megestrol (MEGACE) 40 MG tablet Take 1 tablet (40 mg total) by mouth daily. (Patient not taking: Reported on 08/04/2021)   No facility-administered encounter medications on file as of 08/25/2021.    Allergies (verified) Fosamax [alendronate sodium], Gabapentin, Lidocaine, Midodrine, Risedronate sodium, Shellfish  allergy, and Statins   History: Past Medical History:  Diagnosis Date   Allergy    Anemia    Asthma    Dr. Melvyn Novas   Blood transfusion without reported diagnosis    Breast cyst    Left   Cataract    Dr. Gershon Crane   Complication of anesthesia    ' I HAD HALLUCINATIONS WHEN THEY DID MY HIP "   GERD (gastroesophageal reflux  disease)    Heart murmur    Hemorrhoids    History of colonic polyps Gessner   hx of adenomas (small) 1998   OA (osteoarthritis)    Orthostatic hypotension    Osteopenia    PAC (premature atrial contraction)    Vitamin D deficiency    Past Surgical History:  Procedure Laterality Date   ABDOMINAL HYSTERECTOMY     complete   APPENDECTOMY     BREAST CYST EXCISION     CATARACT EXTRACTION, BILATERAL     04/23/18 left eye, 04/02/18 right eye   COLONOSCOPY  Multiple   Adenomatous colon polyps   ESOPHAGOGASTRODUODENOSCOPY  2010   INTRAMEDULLARY (IM) NAIL INTERTROCHANTERIC Right 06/13/2016   Procedure: INTRAMEDULLARY (IM) NAIL INTERTROCHANTRIC;  Surgeon: Tania Ade, MD;  Location: [facility];  Service: Orthopedics;  Laterality: Right;   TONSILLECTOMY     Family History  Problem Relation Age of Onset   Diabetes Mother 53       Deceased   Hypertension Other    Colon cancer Neg Hx    Pancreatic cancer Neg Hx    Stomach cancer Neg Hx    Esophageal cancer Neg Hx    Rectal cancer Neg Hx    Social History   Socioeconomic History   Marital status: Widowed    Spouse name: Not on file   Number of children: Not on file   Years of education: Not on file   Highest education level: Not on file  Occupational History   Occupation: retired  Tobacco Use   Smoking status: Never   Smokeless tobacco: Never  Vaping Use   Vaping Use: Never used  Substance and Sexual Activity   Alcohol use: Not Currently    Alcohol/week: 0.0 standard drinks   Drug use: No   Sexual activity: Not Currently  Other Topics Concern   Not on file  Social History Narrative   She lives alone in a [place] home.  No children.   Retired Education officer, museum.   Highest level of education:  Masters degree   Social Determinants of Health   Financial Resource Strain: Low Risk    Difficulty of Paying Living Expenses: Not hard at all  Food Insecurity: No Food Insecurity   Worried About Charity fundraiser in the Last  Year: Never true   Arboriculturist in the Last Year: Never true  Transportation Needs: No Transportation Needs   Lack of Transportation (Medical): No   Lack of Transportation (Non-Medical): No  Physical Activity: Inactive   Days of Exercise per Week: 0 days   Minutes of Exercise per Session: 0 min  Stress: No Stress Concern Present   Feeling of Stress : Not at all  Social Connections: Moderately Isolated   Frequency of Communication with Friends and Family: More than three times a week   Frequency of Social Gatherings with Friends and Family: Three times a week   Attends Religious Services: More than 4 times per year   Active Member of Clubs or Organizations: No   Attends Club  or Organization Meetings: Never   Marital Status: Widowed    Tobacco Counseling Counseling given: Not Answered   Clinical Intake:  Pre-visit preparation completed: Yes  Pain : No/denies pain     BMI - recorded: 17.92 Nutritional Status: BMI <19  Underweight Nutritional Risks: Unintentional weight loss Diabetes: No  How often do you need to have someone help you when you read instructions, pamphlets, or other written materials from your doctor or pharmacy?: 1 - Never  Diabetic? No  Interpreter Needed?: No  Information entered by :: Orrin Brigham LPN   Activities of Daily Living In your present state of health, do you have any difficulty performing the following activities: 08/25/2021  Hearing? N  Vision? N  Difficulty concentrating or making decisions? N  Walking or climbing stairs? N  Dressing or bathing? N  Doing errands, shopping? N  Preparing Food and eating ? N  Using the Toilet? N  In the past six months, have you accidently leaked urine? N  Do you have problems with loss of bowel control? N  Managing your Medications? N  Managing your Finances? N  Housekeeping or managing your Housekeeping? N  Some recent data might be hidden    Patient Care Team: [name], Evie Lacks, MD as  PCP - General [name], MD as PCP - Cardiology (Cardiology) Aloha Gell, MD (Obstetrics and Gynecology) Gatha Mayer, MD as Consulting Physician (Gastroenterology) [facility], Melanie Crazier, MD as Consulting Physician (Endocrinology) Rutherford Guys, MD as Consulting Physician (Ophthalmology)  Indicate any recent Medical Services you may have received from other than [facility] providers in the past year (date may be approximate).     Assessment:   This is a routine wellness examination for [place].  Hearing/Vision screen Hearing Screening - Comments:: No issues  Vision Screening - Comments:: Last exam 10 months ago, Dr. Gershon Crane, wears glasses for reading   Dietary issues and exercise activities discussed: Current Exercise Habits: The patient does not participate in regular exercise at present   Goals Addressed             This Visit's Progress    Patient Stated       Would like to try gain weight        Depression Screen PHQ 2/9 Scores 08/25/2021 08/08/2020 10/26/2019 07/03/2018 10/24/2016 09/27/2015 08/13/2014  PHQ - 2 Score 0 0 0 0 0 0 0    Fall Risk Fall Risk  08/25/2021 08/08/2020 10/26/2019 04/10/2018 10/24/2016  Falls in the past year? 0 0 0 No Yes  Number falls in past yr: 0 0 - - 1  Injury with Fall? 0 0 - - Yes  Risk for fall due to : No Fall Risks No Fall Risks - - -  Follow up Falls prevention discussed - Falls evaluation completed - -    FALL RISK PREVENTION PERTAINING TO THE HOME:  Any stairs in or around the home? Yes  If so, are there any without handrails? Yes  Home free of loose throw rugs in walkways, pet beds, electrical cords, etc? Yes  Adequate lighting in your home to reduce risk of falls? Yes   ASSISTIVE DEVICES UTILIZED TO PREVENT FALLS:  Life alert? No  Use of a cane, walker or w/c? No  Grab bars in the bathroom? No  Shower chair or bench in shower? Yes  Elevated toilet seat or a handicapped toilet? Yes   TIMED UP AND GO:  Was the test  performed? No .   Cognitive Function:  Normal cognitive status assessed by  this Nurse Health Advisor. No abnormalities found.          Immunizations Immunization History  Administered Date(s) Administered   Fluad Quad(high Dose 65+) 04/14/2019, 04/28/2020, 03/08/2021   Influenza Split 05/16/2011, 05/07/2012   Influenza Whole 04/29/2008, 04/27/2010   Influenza, High Dose Seasonal PF 04/04/2016, 04/09/2017, 04/10/2018   Influenza,inj,Quad PF,6+ Mos 03/17/2013, 03/23/2014, 03/30/2015   Moderna SARS-COV2 Booster Vaccination 05/12/2020   Moderna Sars-Covid-2 Vaccination 08/02/2019, 08/30/2019   PPD Test 06/15/2016   Pneumococcal Conjugate-13 09/17/2013   Pneumococcal Polysaccharide-23 02/02/2010   Td 06/10/2007   Zoster Recombinat (Shingrix) 08/11/2017, 03/17/2018   Zoster, Live 10/28/2007    TDAP status: Due, Education has been provided regarding the importance of this vaccine. Advised may receive this vaccine at local pharmacy or Health Dept. Aware to provide a copy of the vaccination record if obtained from local pharmacy or Health Dept. Verbalized acceptance and understanding.  Flu Vaccine status: Up to date  Pneumococcal vaccine status: Up to date  Covid-19 vaccine status: Information provided on how to obtain vaccines.   Qualifies for Shingles Vaccine? Yes   Zostavax completed Yes   Shingrix Completed?: Yes  Screening Tests Health Maintenance  Topic Date Due   TETANUS/TDAP  06/09/2017   COVID-19 Vaccine (3 - Moderna risk series) 06/09/2020   Pneumonia Vaccine 68+ Years old  Completed   INFLUENZA VACCINE  Completed   Zoster Vaccines- Shingrix  Completed   HPV VACCINES  Aged Out    Health Maintenance  Health Maintenance Due  Topic Date Due   TETANUS/TDAP  06/09/2017   COVID-19 Vaccine (3 - Moderna risk series) 06/09/2020    Colorectal cancer screening: No longer required.   Mammogram status: Completed 04/26/21. Repeat every year  Bone Density status:  Ordered 08/04/21. Pt provided with contact info and advised to call to schedule appt.  Lung Cancer Screening: (Low Dose CT Chest recommended if Age 62-80 years, 30 pack-year currently smoking OR have quit w/in 15years.) does not qualify.     Additional Screening:  Hepatitis C Screening: does not qualify  Vision Screening: Recommended annual ophthalmology exams for early detection of glaucoma and other disorders of the eye. Is the patient up to date with their annual eye exam?  Yes  Who is the provider or what is the name of the office in which the patient attends annual eye exams? Dr. Gershon Crane   Dental Screening: Recommended annual dental exams for proper oral hygiene  Community Resource Referral / Chronic Care Management: CRR required this visit?  No   CCM required this visit?  No      Plan:     I have personally reviewed and noted the following in the patients chart:   Medical and social history Use of alcohol, tobacco or illicit drugs  Current medications and supplements including opioid prescriptions.  Functional ability and status Nutritional status Physical activity Advanced directives List of other physicians Hospitalizations, surgeries, and ER visits in previous 12 months Vitals Screenings to include cognitive, depression, and falls Referrals and appointments  In addition, I have reviewed and discussed with patient certain preventive protocols, quality metrics, and best practice recommendations. A written personalized care plan for preventive services as well as general preventive health recommendations were provided to patient.   Due to this being a telephonic visit, the after visit summary with patients personalized plan was offered to patient via mail or my-chart. Patient preferred to pick up at office at next visit.  Loma Messing, LPN   [id number]   Nurse Health Advisor  Nurse Notes: none   Medical screening examination/treatment/procedure(s) were  performed by non-physician practitioner and as supervising physician I was immediately available for consultation/collaboration.  I agree with above. Lew Dawes, MD

## 2021-08-25 ENCOUNTER — Ambulatory Visit (INDEPENDENT_AMBULATORY_CARE_PROVIDER_SITE_OTHER): Payer: Medicare Other

## 2021-08-25 ENCOUNTER — Other Ambulatory Visit: Payer: Self-pay

## 2021-08-25 VITALS — Ht 62.0 in | Wt 98.0 lb

## 2021-08-25 DIAGNOSIS — Z0000 Encounter for general adult medical examination without abnormal findings: Secondary | ICD-10-CM

## 2021-08-25 NOTE — Patient Instructions (Addendum)
Cindy Meza , Thank you for taking time to complete your Medicare Wellness Visit. I appreciate your ongoing commitment to your health goals. Please review the following plan we discussed and let me know if I can assist you in the future.   Screening recommendations/referrals: Colonoscopy: no longer required  Mammogram: up to date, completed 04/26/21, due 04/26/22 Bone Density: up to date, completed 08/23/21, due 08/24/23 Recommended yearly ophthalmology/optometry visit for glaucoma screening and checkup Recommended yearly dental visit for hygiene and checkup  Vaccinations: Influenza vaccine: up to date Pneumococcal vaccine: up to date  Tdap vaccine: due, last completed 06/10/07, medicare may cover in the event that you are injured  Shingles vaccine: up to date   Covid-19:newest booster available at your local pharmacy  Advanced directives: Please bring a copy of Living Will and/or [place] for your chart.   Conditions/risks identified: see problem list   Next appointment: Follow up in one year for your annual wellness visit    Preventive Care 84 Years and Older, Female Preventive care refers to lifestyle choices and visits with your health care provider that can promote health and wellness. What does preventive care include? A yearly physical exam. This is also called an annual well check. Dental exams once or twice a year. Routine eye exams. Ask your health care provider how often you should have your eyes checked. Personal lifestyle choices, including: Daily care of your teeth and gums. Regular physical activity. Eating a healthy diet. Avoiding tobacco and drug use. Limiting alcohol use. Practicing safe sex. Taking low-dose aspirin every day. Taking vitamin and mineral supplements as recommended by your health care provider. What happens during an annual well check? The services and screenings done by your health care provider during your annual well check  will depend on your age, overall health, lifestyle risk factors, and family history of disease. Counseling  Your health care provider may ask you questions about your: Alcohol use. Tobacco use. Drug use. Emotional well-being. Home and relationship well-being. Sexual activity. Eating habits. History of falls. Memory and ability to understand (cognition). Work and work Statistician. Reproductive health. Screening  You may have the following tests or measurements: Height, weight, and BMI. Blood pressure. Lipid and cholesterol levels. These may be checked every 5 years, or more frequently if you are over 24 years old. Skin check. Lung cancer screening. You may have this screening every year starting at age 36 if you have a 30-pack-year history of smoking and currently smoke or have quit within the past 15 years. Fecal occult blood test (FOBT) of the stool. You may have this test every year starting at age 27. Flexible sigmoidoscopy or colonoscopy. You may have a sigmoidoscopy every 5 years or a colonoscopy every 10 years starting at age 17. Hepatitis C blood test. Hepatitis B blood test. Sexually transmitted disease (STD) testing. Diabetes screening. This is done by checking your blood sugar (glucose) after you have not eaten for a while (fasting). You may have this done every 1-3 years. Bone density scan. This is done to screen for osteoporosis. You may have this done starting at age 71. Mammogram. This may be done every 1-2 years. Talk to your health care provider about how often you should have regular mammograms. Talk with your health care provider about your test results, treatment options, and if necessary, the need for more tests. Vaccines  Your health care provider may recommend certain vaccines, such as: Influenza vaccine. This is recommended every year. Tetanus, diphtheria,  and acellular pertussis (Tdap, Td) vaccine. You may need a Td booster every 10 years. Zoster vaccine. You  may need this after age 43. Pneumococcal 13-valent conjugate (PCV13) vaccine. One dose is recommended after age 2. Pneumococcal polysaccharide (PPSV23) vaccine. One dose is recommended after age 51. Talk to your health care provider about which screenings and vaccines you need and how often you need them. This information is not intended to replace advice given to you by your health care provider. Make sure you discuss any questions you have with your health care provider. Document Released: 07/22/2015 Document Revised: 03/14/2016 Document Reviewed: 04/26/2015 Elsevier Interactive Patient Education  2017 [place] Prevention in the Home Falls can cause injuries. They can happen to people of all ages. There are many things you can do to make your home safe and to help prevent falls. What can I do on the outside of my home? Regularly fix the edges of walkways and driveways and fix any cracks. Remove anything that might make you trip as you walk through a door, such as a raised step or threshold. Trim any bushes or trees on the path to your home. Use bright outdoor lighting. Clear any walking paths of anything that might make someone trip, such as rocks or tools. Regularly check to see if handrails are loose or broken. Make sure that both sides of any steps have handrails. Any raised decks and porches should have guardrails on the edges. Have any leaves, snow, or ice cleared regularly. Use sand or salt on walking paths during winter. Clean up any spills in your garage right away. This includes oil or grease spills. What can I do in the bathroom? Use night lights. Install grab bars by the toilet and in the tub and shower. Do not use towel bars as grab bars. Use non-skid mats or decals in the tub or shower. If you need to sit down in the shower, use a plastic, non-slip stool. Keep the floor dry. Clean up any water that spills on the floor as soon as it happens. Remove soap buildup  in the tub or shower regularly. Attach bath mats securely with double-sided non-slip rug tape. Do not have throw rugs and other things on the floor that can make you trip. What can I do in the bedroom? Use night lights. Make sure that you have a light by your bed that is easy to reach. Do not use any sheets or blankets that are too big for your bed. They should not hang down onto the floor. Have a firm chair that has side arms. You can use this for support while you get dressed. Do not have throw rugs and other things on the floor that can make you trip. What can I do in the kitchen? Clean up any spills right away. Avoid walking on wet floors. Keep items that you use a lot in easy-to-reach places. If you need to reach something above you, use a strong step stool that has a grab bar. Keep electrical cords out of the way. Do not use floor polish or wax that makes floors slippery. If you must use wax, use non-skid floor wax. Do not have throw rugs and other things on the floor that can make you trip. What can I do with my stairs? Do not leave any items on the stairs. Make sure that there are handrails on both sides of the stairs and use them. Fix handrails that are broken or loose. Make sure  that handrails are as long as the stairways. Check any carpeting to make sure that it is firmly attached to the stairs. Fix any carpet that is loose or worn. Avoid having throw rugs at the top or bottom of the stairs. If you do have throw rugs, attach them to the floor with carpet tape. Make sure that you have a light switch at the top of the stairs and the bottom of the stairs. If you do not have them, ask someone to add them for you. What else can I do to help prevent falls? Wear shoes that: Do not have high heels. Have rubber bottoms. Are comfortable and fit you well. Are closed at the toe. Do not wear sandals. If you use a stepladder: Make sure that it is fully opened. Do not climb a closed  stepladder. Make sure that both sides of the stepladder are locked into place. Ask someone to hold it for you, if possible. Clearly mark and make sure that you can see: Any grab bars or handrails. First and last steps. Where the edge of each step is. Use tools that help you move around (mobility aids) if they are needed. These include: Canes. Walkers. Scooters. Crutches. Turn on the lights when you go into a dark area. Replace any light bulbs as soon as they burn out. Set up your furniture so you have a clear path. Avoid moving your furniture around. If any of your floors are uneven, fix them. If there are any pets around you, be aware of where they are. Review your medicines with your doctor. Some medicines can make you feel dizzy. This can increase your chance of falling. Ask your doctor what other things that you can do to help prevent falls. This information is not intended to replace advice given to you by your health care provider. Make sure you discuss any questions you have with your health care provider. Document Released: 04/21/2009 Document Revised: 12/01/2015 Document Reviewed: 07/30/2014 Elsevier Interactive Patient Education  2017 [employer].

## 2021-08-26 NOTE — Progress Notes (Signed)
Subjective:    Patient ID: Cindy Meza, female    DOB: [birth year], 86 y.o.   MRN: [id number]  This visit occurred during the SARS-CoV-2 public health emergency.  Safety protocols were in place, including screening questions prior to the visit, additional usage of staff PPE, and extensive cleaning of exam room while observing appropriate contact time as indicated for disinfecting solutions.    HPI The patient is here for an acute visit.   Tingling in left arm and hand  -  tingling from her left upper back/posterior shoulder down to her hand.  This that started 5 days ago.  The tingling is intermittent.  Once her left hand locked up and it took a while for her to be able to open it.  That only happened 1 time.  She denies neck pain or stiffness. No weakness.  She denies any neck pain, shoulder pain, elbow pain or wrist pain.  She denies any activities or positions that causes the tingling.  She denies any previous episodes.  She was concerned that this was related to her heart.    Medications and allergies reviewed with patient and updated if appropriate.  Patient Active Problem List   Diagnosis Date Noted   BMI less than 19,adult 06/27/2021   Anemia of chronic disease 03/28/2021   Renal insufficiency 03/28/2021   Weight loss 10/06/2020   Low back pain 07/20/2020   Constipation 04/29/2020   Anxiety 01/03/2020   Rash 07/29/2019   Nausea 02/12/2019   Aortic regurgitation 06/16/2018   Fall 06/12/2016   Closed right hip fracture ([place]) 06/12/2016   Cerumen impaction 04/06/2016   Fatigue 02/15/2016   DOE (dyspnea on exertion) 02/15/2016   Headache 09/27/2015   Coronary atherosclerosis 06/29/2015   Urgency of urination 05/17/2015   Abdominal pain 03/30/2015   Paresthesias 09/21/2014   Breast pain, left 09/21/2014   Chest pressure 07/22/2012   Lightheadedness 07/17/2012   Orthostatic hypotension 07/17/2012   Hot flash, menopausal 02/15/2012   Allergic rhinitis  09/12/2011   Well adult exam 02/14/2011   HEMORRHOIDS, WITH BLEEDING 10/05/2009   Vitamin D deficiency 06/17/2007   Asthma 06/17/2007   GERD (gastroesophageal reflux disease) 06/17/2007   OSTEOARTHRITIS 06/17/2007   Osteoporosis 06/17/2007   COLONIC POLYPS, HX OF 06/17/2007    Current Outpatient Medications on File Prior to Visit  Medication Sig Dispense Refill   ADVAIR HFA 115-21 MCG/ACT inhaler INHALE 2 PUFFS INTO THE LUNGS TWICE DAILY 12 g 3   aspirin 81 MG tablet Take 81 mg by mouth daily.     Calcium-Magnesium-Vitamin D (CALCIUM 500 PO) Take 1 tablet by mouth 2 (two) times daily with a meal. With lunch and supper     cholecalciferol (VITAMIN D3) 25 MCG (1000 UNIT) tablet Take 1,000 Units by mouth daily.     [employer] OMEGA-3 KRILL OIL 500 MG CAPS Take 500 capsules by mouth daily.     Cyanocobalamin (VITAMIN B-12) 1000 MCG SUBL Place 1 tablet (1,000 mcg total) under the tongue in the morning. 100 tablet 3   denosumab (PROLIA) 60 MG/ML SOSY injection Inject 60 mg into the skin every 6 (six) months.     estradiol (ESTRACE) 0.1 MG/GM vaginal cream Place vaginally.     famotidine (PEPCID) 20 MG tablet Take 20 mg by mouth at bedtime.     fludrocortisone (FLORINEF) 0.1 MG tablet Take 0.5 tablets (0.05 mg total) by mouth daily. 45 tablet 3   loratadine (CLARITIN) 10 MG tablet Take 10 mg by  mouth daily.     megestrol (MEGACE) 40 MG tablet Take 1 tablet (40 mg total) by mouth daily. 30 tablet 5   omeprazole (PRILOSEC) 40 MG capsule TAKE 1 CAPSULE(40 MG) BY MOUTH DAILY BEFORE AND BREAKFAST 90 capsule 1   polyethylene glycol (MIRALAX / GLYCOLAX) 17 g packet Take 17 g by mouth daily as needed.     No current facility-administered medications on file prior to visit.    Past Medical History:  Diagnosis Date   Allergy    Anemia    Asthma    Dr. Melvyn Novas   Blood transfusion without reported diagnosis    Breast cyst    Left   Cataract    Dr. Gershon Crane   Complication of anesthesia    ' I HAD  HALLUCINATIONS WHEN THEY DID MY HIP "   GERD (gastroesophageal reflux disease)    Heart murmur    Hemorrhoids    History of colonic polyps [place]   hx of adenomas (small) 1998   OA (osteoarthritis)    Orthostatic hypotension    Osteopenia    PAC (premature atrial contraction)    Vitamin D deficiency     Past Surgical History:  Procedure Laterality Date   ABDOMINAL HYSTERECTOMY     complete   APPENDECTOMY     BREAST CYST EXCISION     CATARACT EXTRACTION, BILATERAL     04/23/18 left eye, 04/02/18 right eye   COLONOSCOPY  Multiple   Adenomatous colon polyps   ESOPHAGOGASTRODUODENOSCOPY  2010   INTRAMEDULLARY (IM) NAIL INTERTROCHANTERIC Right 06/13/2016   Procedure: INTRAMEDULLARY (IM) NAIL INTERTROCHANTRIC;  Surgeon: Tania Ade, MD;  Location: [facility];  Service: Orthopedics;  Laterality: Right;   TONSILLECTOMY      Social History   Socioeconomic History   Marital status: Widowed    Spouse name: Not on file   Number of children: Not on file   Years of education: Not on file   Highest education level: Not on file  Occupational History   Occupation: retired  Tobacco Use   Smoking status: Never   Smokeless tobacco: Never  Vaping Use   Vaping Use: Never used  Substance and Sexual Activity   Alcohol use: Not Currently    Alcohol/week: 0.0 standard drinks   Drug use: No   Sexual activity: Not Currently  Other Topics Concern   Not on file  Social History Narrative   She lives alone in [place] home.  No children.   Retired Education officer, museum.   Highest level of education:  Masters degree   Social Determinants of Health   Financial Resource Strain: Low Risk    Difficulty of Paying Living Expenses: Not hard at all  Food Insecurity: No Food Insecurity   Worried About Charity fundraiser in the Last Year: Never true   Arboriculturist in the Last Year: Never true  Transportation Needs: No Transportation Needs   Lack of Transportation (Medical): No   Lack of  Transportation (Non-Medical): No  Physical Activity: Inactive   Days of Exercise per Week: 0 days   Minutes of Exercise per Session: 0 min  Stress: No Stress Concern Present   Feeling of Stress : Not at all  Social Connections: Moderately Isolated   Frequency of Communication with Friends and Family: More than three times a week   Frequency of Social Gatherings with Friends and Family: Three times a week   Attends Religious Services: More than 4 times per year  Active Member of Clubs or Organizations: No   Attends Archivist Meetings: Never   Marital Status: Widowed    Family History  Problem Relation Age of Onset   Diabetes Mother 70       Deceased   Hypertension Other    Colon cancer Neg Hx    Pancreatic cancer Neg Hx    Stomach cancer Neg Hx    Esophageal cancer Neg Hx    Rectal cancer Neg Hx     Review of Systems     Objective:   Vitals:   08/28/21 1502  BP: 122/60  Pulse: 75  Temp: 98.2 F (36.8 C)  SpO2: 99%   BP Readings from Last 3 Encounters:  08/28/21 122/60  08/04/21 120/80  07/12/21 140/74   Wt Readings from Last 3 Encounters:  08/28/21 97 lb 6.4 oz (44.2 kg)  08/25/21 98 lb (44.5 kg)  08/04/21 98 lb 9.6 oz (44.7 kg)   Body mass index is 17.81 kg/m.   Physical Exam Constitutional:      General: She is not in acute distress.    Appearance: Normal appearance. She is not ill-appearing.  HENT:     Head: Normocephalic and atraumatic.  Musculoskeletal:        General: No swelling, tenderness (No tenderness neck, upper thoracic spine, left shoulder/elbow/wrist or left hand) or deformity. Normal range of motion.  Skin:    General: Skin is warm and dry.     Findings: No rash.  Neurological:     Mental Status: She is alert.     Sensory: No sensory deficit.     Motor: No weakness.           Assessment & Plan:    Tingling sensation left arm and hand: Acute Started 5 days ago without obvious cause Nothing seems to make it  better or worse and her symptoms are intermittent No weakness or pain Likely pinching of someplace between her neck/upper back and wrist Discussed further evaluation Deferred referral to a specialist at this time We will get x-rays of her cervical and thoracic spine For now she will monitor and if her symptoms continue or worsen or if she develops any weakness or pain she will call so that we can evaluate further

## 2021-08-28 ENCOUNTER — Ambulatory Visit (INDEPENDENT_AMBULATORY_CARE_PROVIDER_SITE_OTHER): Payer: Medicare Other | Admitting: Internal Medicine

## 2021-08-28 ENCOUNTER — Ambulatory Visit (INDEPENDENT_AMBULATORY_CARE_PROVIDER_SITE_OTHER): Payer: Medicare Other

## 2021-08-28 ENCOUNTER — Encounter: Payer: Self-pay | Admitting: Internal Medicine

## 2021-08-28 ENCOUNTER — Other Ambulatory Visit: Payer: Self-pay

## 2021-08-28 ENCOUNTER — Telehealth: Payer: Medicare Other

## 2021-08-28 VITALS — BP 122/60 | HR 75 | Temp 98.2°F | Ht 62.0 in | Wt 97.4 lb

## 2021-08-28 DIAGNOSIS — R202 Paresthesia of skin: Secondary | ICD-10-CM

## 2021-08-28 DIAGNOSIS — R200 Anesthesia of skin: Secondary | ICD-10-CM | POA: Diagnosis not present

## 2021-08-28 NOTE — Patient Instructions (Signed)
° °  The tingling is likely from a pinched nerve somewhere from your neck to your wrist.  If this worsens or you have any weakness or pain we need to evaluate this further.     Have xrays downstairs today.      Medications changes include :  none

## 2021-08-31 NOTE — Telephone Encounter (Signed)
Waiting for PA to be faxed from pharmacy

## 2021-09-08 ENCOUNTER — Ambulatory Visit: Payer: Medicare Other

## 2021-09-08 NOTE — Telephone Encounter (Signed)
Prior auth required for Prolia ? ?PA PROCESS DETAILS: Effective 07/09/2021 if the patient is new to Prolia, Prior authorization and Step ?Therapy are required & not on file. Please go to [email or web address] or call [phone] to ?initiate the prior authorization. For exception to the policy please visit ?[email or web address] and review Policy Number [email or web address] ?

## 2021-09-08 NOTE — Telephone Encounter (Signed)
Prior auth initiated via [facility] provider portal.  ?PA# [id number] ?PENDING CLINICAL REVIEW ? ? ? ? ?

## 2021-09-08 NOTE — Telephone Encounter (Signed)
Patient has been moved 08/18/21 at 2pm ?

## 2021-09-12 NOTE — Telephone Encounter (Signed)
Pt ready for scheduling ? ?Out-of-pocket cost due at time of visit: $0 ? ?Primary: UHC Medicare ?Prolia co-insurance: 0% ?Admin fee co-insurance: 0% ? ?Secondary: n/a ?Prolia co-insurance:  ?Admin fee co-insurance:  ? ?Deductible: $200 of $200 met ? ?Prior Auth: APPROVED ?PA# [id number] ?Valid: 09/08/21-09/09/22 ?  ? ?** This summary of benefits is an estimation of the patient's out-of-pocket cost. Exact cost may very based on individual plan coverage.  ? ?

## 2021-09-12 NOTE — Telephone Encounter (Signed)
Prior auth APPROVED ° ° ° ° °

## 2021-09-15 ENCOUNTER — Ambulatory Visit (INDEPENDENT_AMBULATORY_CARE_PROVIDER_SITE_OTHER): Payer: Medicare Other

## 2021-09-15 ENCOUNTER — Other Ambulatory Visit: Payer: Self-pay

## 2021-09-15 VITALS — Ht 62.0 in | Wt 98.0 lb

## 2021-09-15 DIAGNOSIS — M810 Age-related osteoporosis without current pathological fracture: Secondary | ICD-10-CM

## 2021-09-15 MED ORDER — DENOSUMAB 60 MG/ML ~~LOC~~ SOSY
60.0000 mg | PREFILLED_SYRINGE | Freq: Once | SUBCUTANEOUS | Status: AC
  Administered 2021-09-15: 60 mg via SUBCUTANEOUS

## 2021-09-15 NOTE — Progress Notes (Signed)
Patient gave verbal consent to administer the Prolia injection. Medication and dose verified. Prolia injection given successfully.   ?

## 2021-09-16 NOTE — Telephone Encounter (Signed)
Last Prolia inj 09/15/21 ?Next Prolia inj due 03/19/22 ?

## 2021-09-27 ENCOUNTER — Encounter: Payer: Self-pay | Admitting: Internal Medicine

## 2021-09-27 ENCOUNTER — Other Ambulatory Visit: Payer: Self-pay

## 2021-09-27 ENCOUNTER — Ambulatory Visit (INDEPENDENT_AMBULATORY_CARE_PROVIDER_SITE_OTHER): Payer: Medicare Other | Admitting: Internal Medicine

## 2021-09-27 DIAGNOSIS — Z681 Body mass index (BMI) 19 or less, adult: Secondary | ICD-10-CM

## 2021-09-27 DIAGNOSIS — R634 Abnormal weight loss: Secondary | ICD-10-CM | POA: Diagnosis not present

## 2021-09-27 DIAGNOSIS — K219 Gastro-esophageal reflux disease without esophagitis: Secondary | ICD-10-CM | POA: Diagnosis not present

## 2021-09-27 DIAGNOSIS — M810 Age-related osteoporosis without current pathological fracture: Secondary | ICD-10-CM

## 2021-09-27 DIAGNOSIS — J4520 Mild intermittent asthma, uncomplicated: Secondary | ICD-10-CM

## 2021-09-27 DIAGNOSIS — R5383 Other fatigue: Secondary | ICD-10-CM

## 2021-09-27 MED ORDER — MEGESTROL ACETATE 40 MG PO TABS: 40.0000 mg | ORAL_TABLET | Freq: Every day | ORAL | 5 refills | Status: DC

## 2021-09-27 MED ORDER — PANTOPRAZOLE SODIUM 40 MG PO TBEC: 40.0000 mg | DELAYED_RELEASE_TABLET | Freq: Every day | ORAL | 3 refills | Status: DC

## 2021-09-27 MED ORDER — FLUDROCORTISONE ACETATE 0.1 MG PO TABS
0.0500 mg | ORAL_TABLET | Freq: Every day | ORAL | 3 refills | Status: DC
Start: 2021-09-27 — End: 2022-05-25

## 2021-09-27 NOTE — Assessment & Plan Note (Signed)
Worse ? Will re-start PA process for Megace ?

## 2021-09-27 NOTE — Assessment & Plan Note (Signed)
On Prolia.  Will re-start PA process for Megace ?

## 2021-09-27 NOTE — Patient Instructions (Addendum)
Wt Readings from Last 3 Encounters:  ?09/27/21 97 lb 4 oz (44.1 kg)  ?09/15/21 98 lb (44.5 kg)  ?08/28/21 97 lb 6.4 oz (44.2 kg)  ? ? ?

## 2021-09-27 NOTE — Assessment & Plan Note (Signed)
On Prilosec now - pt wants to switch. Start Protonix. ? Potential benefits of a long term PPI use as well as potential risks  and complications were explained to the patient and were aknowledged. ?OK to take Pepcid prn ? ?

## 2021-09-27 NOTE — Assessment & Plan Note (Signed)
CFS ?Wt gain is needed ?

## 2021-09-27 NOTE — Assessment & Plan Note (Signed)
Will re-start PA process for Megace ?

## 2021-09-27 NOTE — Assessment & Plan Note (Signed)
Stable ?On Advair ?

## 2021-09-27 NOTE — Progress Notes (Signed)
? ?Subjective:  ?Patient ID: Cindy Meza, female    DOB: 1936-01-07  Age: 86 y.o. MRN: [id number] ? ?CC: Follow-up and Weight Loss (Concern about weight ) ? ? ?HPI ?Cindy Meza presents for wt loss, osteoporosis, GERD, asthma f/u ? ?Unable to gain wt ?Megace needed PA ? ?Outpatient Medications Prior to Visit  ?Medication Sig Dispense Refill  ? ADVAIR HFA 115-21 MCG/ACT inhaler INHALE 2 PUFFS INTO THE LUNGS TWICE DAILY 12 g 3  ? aspirin 81 MG tablet Take 81 mg by mouth daily.    ? Calcium-Magnesium-Vitamin D (CALCIUM 500 PO) Take 1 tablet by mouth 2 (two) times daily with a meal. With lunch and supper    ? cholecalciferol (VITAMIN D3) 25 MCG (1000 UNIT) tablet Take 1,000 Units by mouth daily.    ? [facility] OMEGA-3 KRILL OIL 500 MG CAPS Take 500 capsules by mouth daily.    ? Cyanocobalamin (VITAMIN B-12) 1000 MCG SUBL Place 1 tablet (1,000 mcg total) under the tongue in the morning. 100 tablet 3  ? denosumab (PROLIA) 60 MG/ML SOSY injection Inject 60 mg into the skin every 6 (six) months.    ? estradiol (ESTRACE) 0.1 MG/GM vaginal cream Place vaginally.    ? famotidine (PEPCID) 20 MG tablet Take 20 mg by mouth at bedtime.    ? loratadine (CLARITIN) 10 MG tablet Take 10 mg by mouth daily.    ? polyethylene glycol (MIRALAX / GLYCOLAX) 17 g packet Take 17 g by mouth daily as needed.    ? fludrocortisone (FLORINEF) 0.1 MG tablet Take 0.5 tablets (0.05 mg total) by mouth daily. 45 tablet 3  ? megestrol (MEGACE) 40 MG tablet Take 1 tablet (40 mg total) by mouth daily. 30 tablet 5  ? omeprazole (PRILOSEC) 40 MG capsule TAKE 1 CAPSULE(40 MG) BY MOUTH DAILY BEFORE AND BREAKFAST 90 capsule 1  ? ?No facility-administered medications prior to visit.  ? ? ?ROS: ?Review of Systems  ?Constitutional:  Positive for fatigue and unexpected weight change. Negative for activity change, appetite change and chills.  ?HENT:  Negative for congestion, mouth sores and sinus pressure.   ?Eyes:  Negative for visual disturbance.   ?Respiratory:  Negative for cough and chest tightness.   ?Gastrointestinal:  Negative for abdominal pain and nausea.  ?Genitourinary:  Negative for difficulty urinating, frequency and vaginal pain.  ?Musculoskeletal:  Positive for arthralgias and gait problem. Negative for back pain.  ?Skin:  Negative for pallor and rash.  ?Neurological:  Negative for dizziness, tremors, weakness, numbness and headaches.  ?Psychiatric/Behavioral:  Negative for confusion and sleep disturbance.   ? ?Objective:  ?BP 130/72   Pulse 78   Temp 97.8 ?F (36.6 ?C) (Oral)   Ht '5\' 2"'$  (1.575 m)   Wt 97 lb 4 oz (44.1 kg)   SpO2 99%   BMI 17.79 kg/m?  ? ?BP Readings from Last 3 Encounters:  ?09/27/21 130/72  ?08/28/21 122/60  ?08/04/21 120/80  ? ? ?Wt Readings from Last 3 Encounters:  ?09/27/21 97 lb 4 oz (44.1 kg)  ?09/15/21 98 lb (44.5 kg)  ?08/28/21 97 lb 6.4 oz (44.2 kg)  ? ? ?Physical Exam ?Constitutional:   ?   General: She is not in acute distress. ?   Appearance: She is well-developed.  ?HENT:  ?   Head: Normocephalic.  ?   Right Ear: External ear normal.  ?   Left Ear: External ear normal.  ?   Nose: Nose normal.  ?Eyes:  ?   General:     ?  Right eye: No discharge.     ?   Left eye: No discharge.  ?   Conjunctiva/sclera: Conjunctivae normal.  ?   Pupils: Pupils are equal, round, and reactive to light.  ?Neck:  ?   Thyroid: No thyromegaly.  ?   Vascular: No JVD.  ?   Trachea: No tracheal deviation.  ?Cardiovascular:  ?   Rate and Rhythm: Normal rate and regular rhythm.  ?   Heart sounds: Normal heart sounds.  ?Pulmonary:  ?   Effort: No respiratory distress.  ?   Breath sounds: No stridor. No wheezing.  ?Abdominal:  ?   General: Bowel sounds are normal. There is no distension.  ?   Palpations: Abdomen is soft. There is no mass.  ?   Tenderness: There is no abdominal tenderness. There is no guarding or rebound.  ?Musculoskeletal:     ?   General: No tenderness.  ?   Cervical back: Normal range of motion and neck supple. No  rigidity.  ?Lymphadenopathy:  ?   Cervical: No cervical adenopathy.  ?Skin: ?   Findings: No erythema or rash.  ?Neurological:  ?   Cranial Nerves: No cranial nerve deficit.  ?   Motor: No abnormal muscle tone.  ?   Coordination: Coordination normal.  ?   Deep Tendon Reflexes: Reflexes normal.  ?Psychiatric:     ?   Behavior: Behavior normal.     ?   Thought Content: Thought content normal.     ?   Judgment: Judgment normal.  ? ? ?Lab Results  ?Component Value Date  ? WBC 6.3 07/12/2021  ? HGB 11.1 07/12/2021  ? HCT 33.8 (L) 07/12/2021  ? PLT 223 07/12/2021  ? GLUCOSE 154 (H) 07/12/2021  ? CHOL 227 (H) 07/12/2021  ? TRIG 45 07/12/2021  ? HDL 107 07/12/2021  ? LDLDIRECT 104.0 03/17/2013  ? LDLCALC 112 (H) 07/12/2021  ? ALT 12 07/12/2021  ? AST 22 07/12/2021  ? NA 139 07/12/2021  ? K 4.2 07/12/2021  ? CL 104 07/12/2021  ? CREATININE 1.14 (H) 07/12/2021  ? BUN 17 07/12/2021  ? CO2 25 07/12/2021  ? TSH 2.310 07/12/2021  ? INR 1.04 06/12/2016  ? HGBA1C 4.9 04/09/2017  ? ? ?CT ABDOMEN PELVIS W CONTRAST ? ?Result Date: 05/24/2021 ?CLINICAL DATA:  Chronic constipation and nausea. EXAM: CT ABDOMEN AND PELVIS WITH CONTRAST TECHNIQUE: Multidetector CT imaging of the abdomen and pelvis was performed using the standard protocol following bolus administration of intravenous contrast. CONTRAST:  175m ISOVUE-300 IOPAMIDOL (ISOVUE-300) INJECTION 61% COMPARISON:  September 28, 2019 FINDINGS: Lower chest: No acute abnormality. Hepatobiliary: No suspicious hepatic lesion. Gallbladder is unremarkable. No biliary ductal dilation. Pancreas: No pancreatic ductal dilation or evidence of acute inflammation. Spleen: Normal in size without focal abnormality. Adrenals/Urinary Tract: Bilateral adrenal glands are unremarkable. No hydronephrosis. No solid enhancing renal mass. Urinary bladder is unremarkable for degree of distension. Stomach/Bowel: Radiopaque enteric contrast traverses the rectum. Small hiatal hernia otherwise the stomach is  unremarkable for degree of distension. The appendix is not confidently identified however there is no pericecal inflammation. No pathologic dilation of small or large bowel. Terminal ileum appears normal. Moderate stool burden, suggestive of constipation. No evidence of acute bowel inflammation. Vascular/Lymphatic: Aortic and branch vessel atherosclerosis without abdominal aortic aneurysm. No pathologically enlarged abdominal or pelvic lymph nodes. Reproductive: Status post hysterectomy. No adnexal masses. Other: No abdominopelvic ascites. Musculoskeletal: Partially visualized right femoral fixation hardware. Diffuse demineralization of bone. Thoracolumbar spondylosis. No acute  osseous abnormality. IMPRESSION: 1. Moderate stool burden, suggestive of constipation. 2. Small hiatal hernia. 3. Aortic Atherosclerosis (ICD10-I70.0). Electronically Signed   By: Dahlia Bailiff M.D.   On: 05/24/2021 10:57  ? ? ?Assessment & Plan:  ? ?Problem List Items Addressed This Visit   ? ? Asthma  ?  Stable ?On Advair ?  ?  ? Relevant Medications  ? fludrocortisone (FLORINEF) 0.1 MG tablet  ? GERD (gastroesophageal reflux disease)  ?  On Prilosec now - pt wants to switch. Start Protonix. ? Potential benefits of a long term PPI use as well as potential risks  and complications were explained to the patient and were aknowledged. ?OK to take Pepcid prn ? ?  ?  ? Relevant Medications  ? pantoprazole (PROTONIX) 40 MG tablet  ? Osteoporosis  ?  On Prolia.  Will re-start PA process for Megace ?  ?  ? Fatigue  ?  CFS ?Wt gain is needed ?  ?  ? Weight loss  ?  Worse ? Will re-start PA process for Megace ?  ?  ? BMI less than 19,adult  ?   Will re-start PA process for Megace ?  ?  ?  ? ? ?Meds ordered this encounter  ?Medications  ? megestrol (MEGACE) 40 MG tablet  ?  Sig: Take 1 tablet (40 mg total) by mouth daily.  ?  Dispense:  30 tablet  ?  Refill:  5  ?  Please start PA process for Megace  ? pantoprazole (PROTONIX) 40 MG tablet  ?  Sig:  Take 1 tablet (40 mg total) by mouth daily.  ?  Dispense:  90 tablet  ?  Refill:  3  ? fludrocortisone (FLORINEF) 0.1 MG tablet  ?  Sig: Take 0.5 tablets (0.05 mg total) by mouth daily.  ?  Dispense:  45 tablet  ?  Refill:  3  ?  Dolphus Jenny

## 2021-10-21 ENCOUNTER — Other Ambulatory Visit: Payer: Self-pay | Admitting: Internal Medicine

## 2021-11-15 ENCOUNTER — Ambulatory Visit: Payer: Medicare Other | Admitting: Internal Medicine

## 2021-11-23 ENCOUNTER — Telehealth: Payer: Self-pay | Admitting: *Deleted

## 2021-11-23 NOTE — Telephone Encounter (Signed)
Rec'd PA for Megestrol 40 mg.. completed on cover-my-meds w/  Key: [id number]. Drugs when used for anorexia, weight loss, or weight gain are excluded from coverage under Medicare rules. Rec'd msg back that med was denied. It states We have denied coverage or payment under your Medicare Part D benefit.Marland KitchenJohny Chess

## 2021-12-27 ENCOUNTER — Encounter: Payer: Self-pay | Admitting: Internal Medicine

## 2021-12-27 ENCOUNTER — Ambulatory Visit (INDEPENDENT_AMBULATORY_CARE_PROVIDER_SITE_OTHER): Payer: Medicare Other | Admitting: Internal Medicine

## 2021-12-27 VITALS — BP 140/82 | HR 50 | Temp 97.8°F | Ht 62.0 in | Wt 95.0 lb

## 2021-12-27 DIAGNOSIS — K219 Gastro-esophageal reflux disease without esophagitis: Secondary | ICD-10-CM

## 2021-12-27 DIAGNOSIS — Z681 Body mass index (BMI) 19 or less, adult: Secondary | ICD-10-CM

## 2021-12-27 DIAGNOSIS — R739 Hyperglycemia, unspecified: Secondary | ICD-10-CM | POA: Insufficient documentation

## 2021-12-27 DIAGNOSIS — R636 Underweight: Secondary | ICD-10-CM | POA: Insufficient documentation

## 2021-12-27 DIAGNOSIS — D638 Anemia in other chronic diseases classified elsewhere: Secondary | ICD-10-CM | POA: Diagnosis not present

## 2021-12-27 DIAGNOSIS — R110 Nausea: Secondary | ICD-10-CM | POA: Diagnosis not present

## 2021-12-27 DIAGNOSIS — R3915 Urgency of urination: Secondary | ICD-10-CM

## 2021-12-27 DIAGNOSIS — E559 Vitamin D deficiency, unspecified: Secondary | ICD-10-CM

## 2021-12-27 DIAGNOSIS — R634 Abnormal weight loss: Secondary | ICD-10-CM | POA: Diagnosis not present

## 2021-12-27 DIAGNOSIS — S72001S Fracture of unspecified part of neck of right femur, sequela: Secondary | ICD-10-CM

## 2021-12-27 LAB — URINALYSIS
Bilirubin Urine: NEGATIVE
Hgb urine dipstick: NEGATIVE
Ketones, ur: NEGATIVE
Leukocytes,Ua: NEGATIVE
Nitrite: NEGATIVE
Specific Gravity, Urine: <=1.005 — AB (ref 1.000–1.030)
Total Protein, Urine: NEGATIVE
Urine Glucose: NEGATIVE
Urobilinogen, UA: 0.2 (ref 0.0–1.0)
pH: 6 (ref 5.0–8.0)

## 2021-12-27 LAB — CBC WITH DIFFERENTIAL/PLATELET
Basophils Absolute: 0 10*3/uL (ref 0.0–0.1)
Basophils Relative: 0.6 % (ref 0.0–3.0)
Eosinophils Absolute: 0.1 10*3/uL (ref 0.0–0.7)
Eosinophils Relative: 1.6 % (ref 0.0–5.0)
HCT: 34.9 % — ABNORMAL LOW (ref 36.0–46.0)
Hemoglobin: 11.5 g/dL — ABNORMAL LOW (ref 12.0–15.0)
Lymphocytes Relative: 44 % (ref 12.0–46.0)
Lymphs Abs: 2.9 10*3/uL (ref 0.7–4.0)
MCHC: 32.8 g/dL (ref 30.0–36.0)
MCV: 94.3 fl (ref 78.0–100.0)
Monocytes Absolute: 0.5 10*3/uL (ref 0.1–1.0)
Monocytes Relative: 7.1 % (ref 3.0–12.0)
Neutro Abs: 3.1 10*3/uL (ref 1.4–7.7)
Neutrophils Relative %: 46.7 % (ref 43.0–77.0)
Platelets: 216 10*3/uL (ref 150.0–400.0)
RBC: 3.7 Mil/uL — ABNORMAL LOW (ref 3.87–5.11)
RDW: 13.9 % (ref 11.5–15.5)
WBC: 6.6 10*3/uL (ref 4.0–10.5)

## 2021-12-27 LAB — COMPREHENSIVE METABOLIC PANEL
ALT: 14 U/L (ref 0–35)
AST: 24 U/L (ref 0–37)
Albumin: 4.2 g/dL (ref 3.5–5.2)
Alkaline Phosphatase: 37 U/L — ABNORMAL LOW (ref 39–117)
BUN: 19 mg/dL (ref 6–23)
CO2: 31 mEq/L (ref 19–32)
Calcium: 9.7 mg/dL (ref 8.4–10.5)
Chloride: 101 mEq/L (ref 96–112)
Creatinine, Ser: 1.17 mg/dL (ref 0.40–1.20)
GFR: 43.02 mL/min — ABNORMAL LOW (ref >60.00)
Glucose, Bld: 92 mg/dL (ref 70–99)
Potassium: 4 mEq/L (ref 3.5–5.1)
Sodium: 137 mEq/L (ref 135–145)
Total Bilirubin: 0.4 mg/dL (ref 0.2–1.2)
Total Protein: 7.7 g/dL (ref 6.0–8.3)

## 2021-12-27 LAB — TSH: TSH: 1.59 u[IU]/mL (ref 0.35–5.50)

## 2021-12-27 LAB — HEMOGLOBIN A1C: Hgb A1c MFr Bld: 5 % (ref 4.6–6.5)

## 2021-12-27 NOTE — Assessment & Plan Note (Signed)
  Occasional nausea after taking Pantoprazole - take w/food

## 2021-12-27 NOTE — Assessment & Plan Note (Addendum)
Megace was denied Occasional nausea after taking Pantoprazole - take it w/food Check A1c

## 2021-12-27 NOTE — Assessment & Plan Note (Signed)
Check A1c. 

## 2021-12-27 NOTE — Assessment & Plan Note (Signed)
On Prolia, calcium, Vit D

## 2021-12-27 NOTE — Progress Notes (Signed)
Subjective:  Patient ID: Cindy Meza, female    DOB: 1935-08-28  Age: 86 y.o. MRN: [id number]  CC: No chief complaint on file.   HPI Cindy Meza presents for wt loss, being underweight, asthma Megace was denied  Outpatient Medications Prior to Visit  Medication Sig Dispense Refill   ADVAIR HFA 115-21 MCG/ACT inhaler INHALE 2 PUFFS INTO THE LUNGS TWICE DAILY 12 g 3   aspirin 81 MG tablet Take 81 mg by mouth daily.     Calcium-Magnesium-Vitamin D (CALCIUM 500 PO) Take 1 tablet by mouth 2 (two) times daily with a meal. With lunch and supper     cholecalciferol (VITAMIN D3) 25 MCG (1000 UNIT) tablet Take 1,000 Units by mouth daily.     [facility] OMEGA-3 KRILL OIL 500 MG CAPS Take 500 capsules by mouth daily.     Cyanocobalamin (VITAMIN B-12) 1000 MCG SUBL Place 1 tablet (1,000 mcg total) under the tongue in the morning. 100 tablet 3   denosumab (PROLIA) 60 MG/ML SOSY injection Inject 60 mg into the skin every 6 (six) months.     estradiol (ESTRACE) 0.1 MG/GM vaginal cream Place vaginally.     famotidine (PEPCID) 20 MG tablet Take 20 mg by mouth at bedtime.     fludrocortisone (FLORINEF) 0.1 MG tablet Take 0.5 tablets (0.05 mg total) by mouth daily. 45 tablet 3   loratadine (CLARITIN) 10 MG tablet Take 10 mg by mouth daily.     pantoprazole (PROTONIX) 40 MG tablet Take 1 tablet (40 mg total) by mouth daily. 90 tablet 3   polyethylene glycol (MIRALAX / GLYCOLAX) 17 g packet Take 17 g by mouth daily as needed.     megestrol (MEGACE) 40 MG tablet Take 1 tablet (40 mg total) by mouth daily. 30 tablet 5   No facility-administered medications prior to visit.    ROS: Review of Systems  Constitutional:  Positive for unexpected weight change. Negative for activity change, appetite change, chills and fatigue.  HENT:  Negative for congestion, mouth sores and sinus pressure.   Eyes:  Negative for visual disturbance.  Respiratory:  Negative for cough and chest tightness.    Gastrointestinal:  Negative for abdominal pain and nausea.  Genitourinary:  Negative for difficulty urinating, frequency and vaginal pain.  Musculoskeletal:  Negative for back pain and gait problem.  Skin:  Negative for pallor and rash.  Neurological:  Negative for dizziness, tremors, weakness, numbness and headaches.  Psychiatric/Behavioral:  Negative for confusion and sleep disturbance.     Objective:  BP 140/82 (BP Location: Right Arm, Patient Position: Sitting, Cuff Size: Normal)   Pulse (!) 50   Temp 97.8 F (36.6 C) (Oral)   Ht '5\' 2"'$  (1.575 m)   Wt 95 lb (43.1 kg)   SpO2 94%   BMI 17.38 kg/m   BP Readings from Last 3 Encounters:  12/27/21 140/82  09/27/21 130/72  08/28/21 122/60    Wt Readings from Last 3 Encounters:  12/27/21 95 lb (43.1 kg)  09/27/21 97 lb 4 oz (44.1 kg)  09/15/21 98 lb (44.5 kg)    Physical Exam Constitutional:      General: She is not in acute distress.    Appearance: She is well-developed.  HENT:     Head: Normocephalic.     Right Ear: External ear normal.     Left Ear: External ear normal.     Nose: Nose normal.  Eyes:     General:  Right eye: No discharge.        Left eye: No discharge.     Conjunctiva/sclera: Conjunctivae normal.     Pupils: Pupils are equal, round, and reactive to light.  Neck:     Thyroid: No thyromegaly.     Vascular: No JVD.     Trachea: No tracheal deviation.  Cardiovascular:     Rate and Rhythm: Normal rate and regular rhythm.     Heart sounds: Normal heart sounds.  Pulmonary:     Effort: No respiratory distress.     Breath sounds: No stridor. No wheezing.  Abdominal:     General: Bowel sounds are normal. There is no distension.     Palpations: Abdomen is soft. There is no mass.     Tenderness: There is no abdominal tenderness. There is no guarding or rebound.  Musculoskeletal:        General: No tenderness.     Cervical back: Normal range of motion and neck supple. No rigidity.   Lymphadenopathy:     Cervical: No cervical adenopathy.  Skin:    Findings: No erythema or rash.  Neurological:     Cranial Nerves: No cranial nerve deficit.     Motor: No abnormal muscle tone.     Coordination: Coordination normal.     Deep Tendon Reflexes: Reflexes normal.  Psychiatric:        Behavior: Behavior normal.        Thought Content: Thought content normal.        Judgment: Judgment normal.     A total time of 45 minutes was spent preparing to see the patient, reviewing tests, x-rays, operative reports and other medical records.  Also, obtaining history and performing comprehensive physical exam.  Additionally, counseling the patient regarding the above listed issues - wt loss, urinary issues.   Finally, documenting clinical information in the health records, coordination of care, educating the patient re: meds, labs.   Lab Results  Component Value Date   WBC 6.6 12/27/2021   HGB 11.5 (L) 12/27/2021   HCT 34.9 (L) 12/27/2021   PLT 216.0 12/27/2021   GLUCOSE 92 12/27/2021   CHOL 227 (H) 07/12/2021   TRIG 45 07/12/2021   HDL 107 07/12/2021   LDLDIRECT 104.0 03/17/2013   LDLCALC 112 (H) 07/12/2021   ALT 14 12/27/2021   AST 24 12/27/2021   NA 137 12/27/2021   K 4.0 12/27/2021   CL 101 12/27/2021   CREATININE 1.17 12/27/2021   BUN 19 12/27/2021   CO2 31 12/27/2021   TSH 1.59 12/27/2021   INR 1.04 06/12/2016   HGBA1C 5.0 12/27/2021    CT ABDOMEN PELVIS W CONTRAST  Result Date: 05/24/2021 CLINICAL DATA:  Chronic constipation and nausea. EXAM: CT ABDOMEN AND PELVIS WITH CONTRAST TECHNIQUE: Multidetector CT imaging of the abdomen and pelvis was performed using the standard protocol following bolus administration of intravenous contrast. CONTRAST:  180m ISOVUE-300 IOPAMIDOL (ISOVUE-300) INJECTION 61% COMPARISON:  September 28, 2019 FINDINGS: Lower chest: No acute abnormality. Hepatobiliary: No suspicious hepatic lesion. Gallbladder is unremarkable. No biliary ductal  dilation. Pancreas: No pancreatic ductal dilation or evidence of acute inflammation. Spleen: Normal in size without focal abnormality. Adrenals/Urinary Tract: Bilateral adrenal glands are unremarkable. No hydronephrosis. No solid enhancing renal mass. Urinary bladder is unremarkable for degree of distension. Stomach/Bowel: Radiopaque enteric contrast traverses the rectum. Small hiatal hernia otherwise the stomach is unremarkable for degree of distension. The appendix is not confidently identified however there is no pericecal inflammation. No  pathologic dilation of small or large bowel. Terminal ileum appears normal. Moderate stool burden, suggestive of constipation. No evidence of acute bowel inflammation. Vascular/Lymphatic: Aortic and branch vessel atherosclerosis without abdominal aortic aneurysm. No pathologically enlarged abdominal or pelvic lymph nodes. Reproductive: Status post hysterectomy. No adnexal masses. Other: No abdominopelvic ascites. Musculoskeletal: Partially visualized right femoral fixation hardware. Diffuse demineralization of bone. Thoracolumbar spondylosis. No acute osseous abnormality. IMPRESSION: 1. Moderate stool burden, suggestive of constipation. 2. Small hiatal hernia. 3. Aortic Atherosclerosis (ICD10-I70.0). Electronically Signed   By: Dahlia Bailiff M.D.   On: 05/24/2021 10:57    Assessment & Plan:   Problem List Items Addressed This Visit     Anemia of chronic disease   Relevant Orders   Iron, TIBC and Ferritin Panel (Completed)   CBC with Differential/Platelet (Completed)   BMI less than 19,adult    BMI 17 in 2023 Megace was not covered      Closed right hip fracture (HCC)    On Prolia, calcium, Vit D      GERD (gastroesophageal reflux disease)     Occasional nausea after taking Pantoprazole - take w/food      Hyperglycemia - Primary   Relevant Orders   Hemoglobin A1c (Completed)   Nausea    Occasional.  Nausea after taking Pantoprazole - take w/food       Underweight     BMI 17.  Megace was denied Occasional nausea after taking Pantoprazole - take w/food      Urgency of urination    Check A1c      Vitamin D deficiency    On Vit D      Weight loss    Megace was denied Occasional nausea after taking Pantoprazole - take it w/food Check A1c      Relevant Orders   Comprehensive metabolic panel (Completed)   Hemoglobin A1c (Completed)   TSH (Completed)   Urinalysis (Completed)   Iron, TIBC and Ferritin Panel (Completed)   CBC with Differential/Platelet (Completed)      No orders of the defined types were placed in this encounter.     Follow-up: Return in about 3 months (around 03/29/2022) for a follow-up visit.  [name], MD

## 2021-12-27 NOTE — Assessment & Plan Note (Addendum)
BMI 17.  Megace was denied Occasional nausea after taking Pantoprazole - take w/food

## 2021-12-27 NOTE — Assessment & Plan Note (Signed)
Occasional.  Nausea after taking Pantoprazole - take w/food

## 2021-12-27 NOTE — Assessment & Plan Note (Signed)
On Vit D 

## 2021-12-27 NOTE — Assessment & Plan Note (Signed)
BMI 17 in 2023 Megace was not covered

## 2021-12-28 ENCOUNTER — Ambulatory Visit: Payer: Medicare Other | Admitting: Internal Medicine

## 2021-12-28 LAB — IRON,TIBC AND FERRITIN PANEL
%SAT: 24 % (calc) (ref 16–45)
Ferritin: 89 ng/mL (ref 16–288)
Iron: 73 ug/dL (ref 45–160)
TIBC: 307 mcg/dL (calc) (ref 250–450)

## 2022-02-06 NOTE — Telephone Encounter (Signed)
Prolia VOB initiated via [email or web address]  Last Prolia inj 09/15/21 Next Prolia inj due 03/19/22   Prior Auth: APPROVED PA# [id number] Valid: 09/08/21-09/09/22

## 2022-02-15 ENCOUNTER — Telehealth: Payer: Self-pay | Admitting: Internal Medicine

## 2022-02-15 NOTE — Telephone Encounter (Signed)
Prior auth required for PROLIA  PA PROCESS DETAILS: Effective 07/09/2021 if the patient is new to Prolia, Prior authorization and Step Therapy are required & not on file. Please go to [email or web address] or call [phone] to initiate the prior authorization. For exception to the policy please visit [email or web address] and review Policy Number [id number] 

## 2022-02-15 NOTE — Telephone Encounter (Signed)
Pt c/o Shortness Of Breath: STAT if SOB developed within the last 24 hours or pt is noticeably SOB on the phone  1. Are you currently SOB (can you hear that pt is SOB on the phone)? Yes   2. How long have you been experiencing SOB? Last night  3. Are you SOB when sitting or when up moving around? Moving around    4. Are you currently experiencing any other symptoms? Dizziness, lightheaded, pre-syncope

## 2022-02-15 NOTE — Progress Notes (Signed)
Office Visit    Patient Name: Cindy Meza Date of Encounter: 02/15/2022  Primary Care Provider:  Cassandria Anger, MD Primary Cardiologist:  Dorris Carnes, MD Primary Electrophysiologist: None  Chief Complaint    Cindy Meza is a 86 y.o. female with PMH of orthostatic hypotension, asthma, GERD, arthritis, mild AR, moderate MR who presents today with complaint of shortness of breath and dizziness.  Past Medical History    Past Medical History:  Diagnosis Date   Allergy    Anemia    Asthma    Dr. Melvyn Novas   Blood transfusion without reported diagnosis    Breast cyst    Left   Cataract    Dr. Gershon Crane   Complication of anesthesia    ' I HAD HALLUCINATIONS WHEN THEY DID MY HIP "   GERD (gastroesophageal reflux disease)    Heart murmur    Hemorrhoids    History of colonic polyps [facility]   hx of adenomas (small) 1998   OA (osteoarthritis)    Orthostatic hypotension    Osteopenia    PAC (premature atrial contraction)    Vitamin D deficiency    Past Surgical History:  Procedure Laterality Date   ABDOMINAL HYSTERECTOMY     complete   APPENDECTOMY     BREAST CYST EXCISION     CATARACT EXTRACTION, BILATERAL     04/23/18 left eye, 04/02/18 right eye   COLONOSCOPY  Multiple   Adenomatous colon polyps   ESOPHAGOGASTRODUODENOSCOPY  2010   INTRAMEDULLARY (IM) NAIL INTERTROCHANTERIC Right 06/13/2016   Procedure: INTRAMEDULLARY (IM) NAIL INTERTROCHANTRIC;  Surgeon: Tania Ade, MD;  Location: [facility];  Service: Orthopedics;  Laterality: Right;   TONSILLECTOMY      Allergies  Allergies  Allergen Reactions   Fosamax [Alendronate Sodium]     Paresthesias, abd pain   Gabapentin     dizziness   Lidocaine Swelling    Of face and lips.  Novocaine does same thing.   Midodrine     Nausea    Risedronate Sodium     Knee pain   Shellfish Allergy Hives   Statins     REACTION: pains    History of Present Illness    Cindy Meza is a 86 year old  female with the above-mentioned past medical history who has been followed by Dr. Harrington Challenger since 2017. She underwent a normal stress Myoview on 04/25/2016 and an echocardiogram on 04/20/2016 showing EF 19-62%, grade 1 diastolic dysfunction, mild AR and moderate MR. In 2019 she had a complaint of chest tightness and orthostatic hypotension.  Lexiscan Myoview was completed andwas negative for ischemia. EF was 41% on stress test but echo showed EF 50-55%.  She has suffered from fatigue and orthostatic hypotension for multiple years and has been managed with Florinef and hydration.  She was last seen by Dr. Harrington Challenger on 07/2021 and during visit patient was doing okay with rare dizziness and BPs at home between 22-979 systolic.  She was advised to stay on her current regimen for managing hypotension and increase fluids.  She contacted the office on 02/14/22 with complaint of increased shortness of breath and lightheadedness.  She stated that shortness of breath increases with ambulation.  She is unable to check blood pressure due to broken cuff and denies any palpitations or chest pain.  Since last being seen in the office patient reports that she is been feeling some occasional shortness of breath and dizziness.  She states today that her shortness of  breath has not reoccurred and she feels may have been attributed to the heat outdoors.  She does not report any fainting or syncope but does note some presyncope at times.  She is drinking around 40 ounces of fluid daily and is compliant with her medications.  Patient denies chest pain, palpitations, dyspnea, PND, orthopnea, nausea, vomiting, dizziness, syncope, edema, weight gain, or early satiety.  Current Outpatient Medications  Medication Sig Dispense Refill   ADVAIR HFA 115-21 MCG/ACT inhaler INHALE 2 PUFFS INTO THE LUNGS TWICE DAILY 12 g 3   aspirin 81 MG tablet Take 81 mg by mouth daily.     Calcium-Magnesium-Vitamin D (CALCIUM 500 PO) Take 1 tablet by mouth 2 (two)  times daily with a meal. With lunch and supper     cholecalciferol (VITAMIN D3) 25 MCG (1000 UNIT) tablet Take 1,000 Units by mouth daily.     [employer] OMEGA-3 KRILL OIL 500 MG CAPS Take 500 capsules by mouth daily.     Cyanocobalamin (VITAMIN B-12) 1000 MCG SUBL Place 1 tablet (1,000 mcg total) under the tongue in the morning. 100 tablet 3   denosumab (PROLIA) 60 MG/ML SOSY injection Inject 60 mg into the skin every 6 (six) months.     estradiol (ESTRACE) 0.1 MG/GM vaginal cream Place vaginally.     famotidine (PEPCID) 20 MG tablet Take 20 mg by mouth at bedtime.     fludrocortisone (FLORINEF) 0.1 MG tablet Take 0.5 tablets (0.05 mg total) by mouth daily. 45 tablet 3   loratadine (CLARITIN) 10 MG tablet Take 10 mg by mouth daily.     pantoprazole (PROTONIX) 40 MG tablet Take 1 tablet (40 mg total) by mouth daily. 90 tablet 3   polyethylene glycol (MIRALAX / GLYCOLAX) 17 g packet Take 17 g by mouth daily as needed.     No current facility-administered medications for this visit.     Review of Systems  Please see the history of present illness.    (+) Dizziness (+) Shortness of breath  All other systems reviewed and are otherwise negative except as noted above.  Physical Exam    Wt Readings from Last 3 Encounters:  12/27/21 95 lb (43.1 kg)  09/27/21 97 lb 4 oz (44.1 kg)  09/15/21 98 lb (44.5 kg)   XA:JOINO were no vitals filed for this visit.,There is no height or weight on file to calculate BMI.  Constitutional:      Appearance: Elderly appearance. Not in distress.  Neck:     Vascular: JVD normal.  Pulmonary:     Effort: Pulmonary effort is normal.     Breath sounds: No wheezing. No rales. Diminished in the bases Cardiovascular:     Normal rate. Regular rhythm. Normal S1. Normal S2.      Murmurs: There is no murmur.  Edema:    Peripheral edema absent.  Abdominal:     Palpations: Abdomen is soft non tender. There is no hepatomegaly.  Skin:    General: Skin is warm and dry.   Neurological:     General: No focal deficit present.     Mental Status: Alert and oriented to person, place and time.     Cranial Nerves: Cranial nerves are intact.  EKG/LABS/Other Studies Reviewed    ECG personally reviewed by me today -none completed today  Lab Results  Component Value Date   WBC 6.6 12/27/2021   HGB 11.5 (L) 12/27/2021   HCT 34.9 (L) 12/27/2021   MCV 94.3 12/27/2021   PLT 216.0  12/27/2021   Lab Results  Component Value Date   CREATININE 1.17 12/27/2021   BUN 19 12/27/2021   NA 137 12/27/2021   K 4.0 12/27/2021   CL 101 12/27/2021   CO2 31 12/27/2021   Lab Results  Component Value Date   ALT 14 12/27/2021   AST 24 12/27/2021   ALKPHOS 37 (L) 12/27/2021   BILITOT 0.4 12/27/2021   Lab Results  Component Value Date   CHOL 227 (H) 07/12/2021   HDL 107 07/12/2021   LDLCALC 112 (H) 07/12/2021   LDLDIRECT 104.0 03/17/2013   TRIG 45 07/12/2021   CHOLHDL 2.1 07/12/2021    Lab Results  Component Value Date   HGBA1C 5.0 12/27/2021    Assessment & Plan    1.  Orthostatic hypotension: -Patient currently on Florinef 0.1 mg daily -Increase fluid intake especially caffeine and sodium intake -I also encouraged to wear waist high compression binder daily -Patient also encouraged to increase head of the bed 30 to 45 degrees -She will also increase her calorie intake by adding boost or Glucerna to her meals -I discussed the possibility of trying a midodrine if no change occurs with above-mentioned changes.  2.  Coronary artery disease: -Patient's last ischemic evaluation was completed in 2019 by Lexiscan stress test that showed no blockages. -Patient denies any chest pain or addition of shortness of breath -She was encouraged to change to enteric-coated ASA 81 mg to help prevent GI irritation   3.  Hyperlipidemia: -Last LDL was -Patient has statin intolerance currently is managing with lifestyle  4.  History of aortic regurgitation: -2D echo  completed 05/2018 EF of 50-55%, mild to moderate regurgitation   5.  Dyspnea on exertion -Patient states that her shortness of breath has resolved and feels it may have been caused by the heat outdoors. -Patient is euvolemic on examination      Disposition: Follow-up with Dorris Carnes, MD or APP in 12 months    Medication Adjustments/Labs and Tests Ordered: Current medicines are reviewed at length with the patient today.  Concerns regarding medicines are outlined above.   Signed, Mable Fill, Marissa Nestle, NP 02/15/2022, 6:21 PM [place]

## 2022-02-15 NOTE — Telephone Encounter (Signed)
Patient reports new onset SOB times a few day with lightheadedness and nausea today.  SOB worsens with activity such as walking. She has not checked her HR or BP. She states her BP cuff does not work. She denies wheezing and cough. She does not report CP or palpitations. She is taking her medications as prescribed.  She will come in on 8/11 for a visit with APP to be assessed.

## 2022-02-16 ENCOUNTER — Encounter: Payer: Self-pay | Admitting: Nurse Practitioner

## 2022-02-16 ENCOUNTER — Ambulatory Visit (INDEPENDENT_AMBULATORY_CARE_PROVIDER_SITE_OTHER): Payer: Medicare Other | Admitting: Nurse Practitioner

## 2022-02-16 VITALS — BP 118/60 | HR 78 | Ht 62.0 in | Wt 92.8 lb

## 2022-02-16 DIAGNOSIS — I951 Orthostatic hypotension: Secondary | ICD-10-CM

## 2022-02-16 DIAGNOSIS — I351 Nonrheumatic aortic (valve) insufficiency: Secondary | ICD-10-CM

## 2022-02-16 DIAGNOSIS — E785 Hyperlipidemia, unspecified: Secondary | ICD-10-CM

## 2022-02-16 DIAGNOSIS — R0609 Other forms of dyspnea: Secondary | ICD-10-CM | POA: Diagnosis not present

## 2022-02-16 DIAGNOSIS — I2510 Atherosclerotic heart disease of native coronary artery without angina pectoris: Secondary | ICD-10-CM | POA: Diagnosis not present

## 2022-02-16 NOTE — Patient Instructions (Addendum)
Medication Instructions:  EC ASA 81 MG  *If you need a refill on your cardiac medications before your next appointment, please call your pharmacy*   Lab Work: NONE If you have labs (blood work) drawn today and your tests are completely normal, you will receive your results only by: [facility] (if you have MyChart) OR A paper copy in the mail If you have any lab test that is abnormal or we need to change your treatment, we will call you to review the results.   Testing/Procedures: NONE   Follow-Up: At [facility], you and your health needs are our priority.  As part of our continuing mission to provide you with exceptional heart care, we have created designated Provider Care Teams.  These Care Teams include your primary Cardiologist (physician) and Advanced Practice Providers (APPs -  Physician Assistants and Nurse Practitioners) who all work together to provide you with the care you need, when you need it.  We recommend signing up for the patient portal called "MyChart".  Sign up information is provided on this After Visit Summary.  MyChart is used to connect with patients for Virtual Visits (Telemedicine).  Patients are able to view lab/test results, encounter notes, upcoming appointments, etc.  Non-urgent messages can be sent to your provider as well.   To learn more about what you can do with MyChart, go to [email or web address].    Your next appointment:   FALL OR WINTER 2023   The format for your next appointment:     Provider:   WITH DR ROSS    Other Instructions GREEN TEA IN AM AND [place]

## 2022-02-20 NOTE — Telephone Encounter (Signed)
Pt ready for scheduling on or after 03/19/22   Out-of-pocket cost due at time of visit: $0   Primary: [employer] Medicare [place] co-insurance: 0% Admin fee co-insurance: 0%   Secondary: n/a [place] co-insurance:  Admin fee co-insurance:    Deductible: $200 of $200 met   Prior Auth: APPROVED PA# [id number] Valid: 09/08/21-09/09/22     ** This summary of benefits is an estimation of the patient's out-of-pocket cost. Exact cost may very based on individual plan coverage.

## 2022-03-16 ENCOUNTER — Ambulatory Visit: Payer: Medicare Other | Admitting: Internal Medicine

## 2022-03-28 ENCOUNTER — Ambulatory Visit (INDEPENDENT_AMBULATORY_CARE_PROVIDER_SITE_OTHER): Payer: Medicare Other | Admitting: Internal Medicine

## 2022-03-28 ENCOUNTER — Encounter: Payer: Self-pay | Admitting: Internal Medicine

## 2022-03-28 VITALS — BP 140/72 | HR 78 | Temp 97.6°F | Ht 62.0 in | Wt 96.0 lb

## 2022-03-28 DIAGNOSIS — I951 Orthostatic hypotension: Secondary | ICD-10-CM

## 2022-03-28 DIAGNOSIS — R636 Underweight: Secondary | ICD-10-CM

## 2022-03-28 DIAGNOSIS — J4520 Mild intermittent asthma, uncomplicated: Secondary | ICD-10-CM | POA: Diagnosis not present

## 2022-03-28 DIAGNOSIS — K219 Gastro-esophageal reflux disease without esophagitis: Secondary | ICD-10-CM | POA: Diagnosis not present

## 2022-03-28 DIAGNOSIS — Z23 Encounter for immunization: Secondary | ICD-10-CM

## 2022-03-28 LAB — COMPREHENSIVE METABOLIC PANEL
ALT: 12 U/L (ref 0–35)
AST: 20 U/L (ref 0–37)
Albumin: 3.8 g/dL (ref 3.5–5.2)
Alkaline Phosphatase: 37 U/L — ABNORMAL LOW (ref 39–117)
BUN: 21 mg/dL (ref 6–23)
CO2: 29 mEq/L (ref 19–32)
Calcium: 9.5 mg/dL (ref 8.4–10.5)
Chloride: 103 mEq/L (ref 96–112)
Creatinine, Ser: 1.13 mg/dL (ref 0.40–1.20)
GFR: 44.78 mL/min — ABNORMAL LOW (ref >60.00)
Glucose, Bld: 96 mg/dL (ref 70–99)
Potassium: 3.9 mEq/L (ref 3.5–5.1)
Sodium: 138 mEq/L (ref 135–145)
Total Bilirubin: 0.4 mg/dL (ref 0.2–1.2)
Total Protein: 7.5 g/dL (ref 6.0–8.3)

## 2022-03-28 LAB — CBC WITH DIFFERENTIAL/PLATELET
Basophils Absolute: 0 10*3/uL (ref 0.0–0.1)
Basophils Relative: 0.6 % (ref 0.0–3.0)
Eosinophils Absolute: 0.1 10*3/uL (ref 0.0–0.7)
Eosinophils Relative: 1.9 % (ref 0.0–5.0)
HCT: 34.3 % — ABNORMAL LOW (ref 36.0–46.0)
Hemoglobin: 11.6 g/dL — ABNORMAL LOW (ref 12.0–15.0)
Lymphocytes Relative: 39.4 % (ref 12.0–46.0)
Lymphs Abs: 2.6 10*3/uL (ref 0.7–4.0)
MCHC: 33.7 g/dL (ref 30.0–36.0)
MCV: 93.9 fl (ref 78.0–100.0)
Monocytes Absolute: 0.5 10*3/uL (ref 0.1–1.0)
Monocytes Relative: 7.9 % (ref 3.0–12.0)
Neutro Abs: 3.3 10*3/uL (ref 1.4–7.7)
Neutrophils Relative %: 50.2 % (ref 43.0–77.0)
Platelets: 243 10*3/uL (ref 150.0–400.0)
RBC: 3.65 Mil/uL — ABNORMAL LOW (ref 3.87–5.11)
RDW: 12.6 % (ref 11.5–15.5)
WBC: 6.6 10*3/uL (ref 4.0–10.5)

## 2022-03-28 LAB — TSH: TSH: 1.43 u[IU]/mL (ref 0.35–5.50)

## 2022-03-28 MED ORDER — FLUTICASONE-SALMETEROL 115-21 MCG/ACT IN AERO: 1.0000 | INHALATION_SPRAY | Freq: Two times a day (BID) | RESPIRATORY_TRACT | 11 refills | Status: DC

## 2022-03-28 NOTE — Patient Instructions (Signed)
Pantoprazole - take w/food

## 2022-03-28 NOTE — Assessment & Plan Note (Signed)
Wt Readings from Last 3 Encounters:  03/28/22 96 lb (43.5 kg)  02/16/22 92 lb 12.8 oz (42.1 kg)  12/27/21 95 lb (43.1 kg)  pt gained 4 lbs

## 2022-03-28 NOTE — Assessment & Plan Note (Signed)
Occasional nausea after taking Pantoprazole - take w/food

## 2022-03-28 NOTE — Progress Notes (Signed)
Subjective:  Patient ID: Cindy Meza, female    DOB: 07-31-1935  Age: 86 y.o. MRN: [id number]  CC: Follow-up (3 month f/u- Flu shot)   HPI Cindy Meza presents for GERD - on Protonix  [name] feels good until she takes her Florinef 1/2 tab  F/u on osteoporosis, asthma  Outpatient Medications Prior to Visit  Medication Sig Dispense Refill   aspirin 81 MG tablet Take 81 mg by mouth daily.     Calcium-Magnesium-Vitamin D (CALCIUM 500 PO) Take 1 tablet by mouth 2 (two) times daily with a meal. With lunch and supper     cholecalciferol (VITAMIN D3) 25 MCG (1000 UNIT) tablet Take 1,000 Units by mouth daily.     [employer] OMEGA-3 KRILL OIL 500 MG CAPS Take 500 capsules by mouth daily.     Cyanocobalamin (VITAMIN B-12) 1000 MCG SUBL Place 1 tablet (1,000 mcg total) under the tongue in the morning. 100 tablet 3   denosumab (PROLIA) 60 MG/ML SOSY injection Inject 60 mg into the skin every 6 (six) months.     estradiol (ESTRACE) 0.1 MG/GM vaginal cream Place vaginally.     famotidine (PEPCID) 20 MG tablet Take 20 mg by mouth at bedtime.     fludrocortisone (FLORINEF) 0.1 MG tablet Take 0.5 tablets (0.05 mg total) by mouth daily. 45 tablet 3   loratadine (CLARITIN) 10 MG tablet Take 10 mg by mouth daily.     pantoprazole (PROTONIX) 40 MG tablet Take 1 tablet (40 mg total) by mouth daily. 90 tablet 3   polyethylene glycol (MIRALAX / GLYCOLAX) 17 g packet Take 17 g by mouth daily as needed.     Wheat Dextrin (BENEFIBER DRINK MIX PO) Take 1 Capful by mouth daily.     ADVAIR HFA 115-21 MCG/ACT inhaler INHALE 2 PUFFS INTO THE LUNGS TWICE DAILY 12 g 3   No facility-administered medications prior to visit.    ROS: Review of Systems  Constitutional:  Positive for fatigue. Negative for activity change, appetite change, chills and unexpected weight change.  HENT:  Negative for congestion, mouth sores and sinus pressure.   Eyes:  Negative for visual disturbance.  Respiratory:  Negative for  cough and chest tightness.   Gastrointestinal:  Negative for abdominal pain and nausea.  Genitourinary:  Negative for difficulty urinating, frequency and vaginal pain.  Musculoskeletal:  Negative for back pain and gait problem.  Skin:  Negative for pallor and rash.  Neurological:  Negative for dizziness, tremors, weakness, numbness and headaches.  Psychiatric/Behavioral:  Negative for confusion, sleep disturbance and suicidal ideas. The patient is not nervous/anxious.     Objective:  BP (!) 140/72 (BP Location: Left Arm)   Pulse 78   Temp 97.6 F (36.4 C) (Oral)   Ht '5\' 2"'$  (1.575 m)   Wt 96 lb (43.5 kg)   SpO2 99%   BMI 17.56 kg/m   BP Readings from Last 3 Encounters:  03/28/22 (!) 140/72  02/16/22 118/60  12/27/21 140/82    Wt Readings from Last 3 Encounters:  03/28/22 96 lb (43.5 kg)  02/16/22 92 lb 12.8 oz (42.1 kg)  12/27/21 95 lb (43.1 kg)    Physical Exam  Lab Results  Component Value Date   WBC 6.6 12/27/2021   HGB 11.5 (L) 12/27/2021   HCT 34.9 (L) 12/27/2021   PLT 216.0 12/27/2021   GLUCOSE 92 12/27/2021   CHOL 227 (H) 07/12/2021   TRIG 45 07/12/2021   HDL 107 07/12/2021   LDLDIRECT 104.0  03/17/2013   LDLCALC 112 (H) 07/12/2021   ALT 14 12/27/2021   AST 24 12/27/2021   NA 137 12/27/2021   K 4.0 12/27/2021   CL 101 12/27/2021   CREATININE 1.17 12/27/2021   BUN 19 12/27/2021   CO2 31 12/27/2021   TSH 1.59 12/27/2021   INR 1.04 06/12/2016   HGBA1C 5.0 12/27/2021    CT ABDOMEN PELVIS W CONTRAST  Result Date: 05/24/2021 CLINICAL DATA:  Chronic constipation and nausea. EXAM: CT ABDOMEN AND PELVIS WITH CONTRAST TECHNIQUE: Multidetector CT imaging of the abdomen and pelvis was performed using the standard protocol following bolus administration of intravenous contrast. CONTRAST:  156m ISOVUE-300 IOPAMIDOL (ISOVUE-300) INJECTION 61% COMPARISON:  September 28, 2019 FINDINGS: Lower chest: No acute abnormality. Hepatobiliary: No suspicious hepatic lesion.  Gallbladder is unremarkable. No biliary ductal dilation. Pancreas: No pancreatic ductal dilation or evidence of acute inflammation. Spleen: Normal in size without focal abnormality. Adrenals/Urinary Tract: Bilateral adrenal glands are unremarkable. No hydronephrosis. No solid enhancing renal mass. Urinary bladder is unremarkable for degree of distension. Stomach/Bowel: Radiopaque enteric contrast traverses the rectum. Small hiatal hernia otherwise the stomach is unremarkable for degree of distension. The appendix is not confidently identified however there is no pericecal inflammation. No pathologic dilation of small or large bowel. Terminal ileum appears normal. Moderate stool burden, suggestive of constipation. No evidence of acute bowel inflammation. Vascular/Lymphatic: Aortic and branch vessel atherosclerosis without abdominal aortic aneurysm. No pathologically enlarged abdominal or pelvic lymph nodes. Reproductive: Status post hysterectomy. No adnexal masses. Other: No abdominopelvic ascites. Musculoskeletal: Partially visualized right femoral fixation hardware. Diffuse demineralization of bone. Thoracolumbar spondylosis. No acute osseous abnormality. IMPRESSION: 1. Moderate stool burden, suggestive of constipation. 2. Small hiatal hernia. 3. Aortic Atherosclerosis (ICD10-I70.0). Electronically Signed   By: JDahlia BailiffM.D.   On: 05/24/2021 10:57    Assessment & Plan:   Problem List Items Addressed This Visit     Asthma   Relevant Medications   fluticasone-salmeterol (ADVAIR HFA) 115-21 MCG/ACT inhaler   GERD (gastroesophageal reflux disease)    Occasional nausea after taking Pantoprazole - take w/food      Relevant Medications   Wheat Dextrin (BENEFIBER DRINK MIX PO)   Other Relevant Orders   TSH   Orthostatic hypotension   Relevant Orders   Comprehensive metabolic panel   CBC with Differential/Platelet   TSH   Underweight    Wt Readings from Last 3 Encounters:  03/28/22 96 lb (43.5  kg)  02/16/22 92 lb 12.8 oz (42.1 kg)  12/27/21 95 lb (43.1 kg)  pt gained 4 lbs       Relevant Orders   Comprehensive metabolic panel   TSH   Other Visit Diagnoses     Needs flu shot    -  Primary   Relevant Orders   Flu Vaccine QUAD High Dose(Fluad) (Completed)         Meds ordered this encounter  Medications   fluticasone-salmeterol (ADVAIR HFA) 115-21 MCG/ACT inhaler    Sig: Inhale 1-2 puffs into the lungs 2 (two) times daily.    Dispense:  12 g    Refill:  11      Follow-up: Return in about 3 months (around 06/27/2022) for a follow-up visit.  AWalker Kehr MD

## 2022-04-20 ENCOUNTER — Encounter: Payer: Self-pay | Admitting: Physician Assistant

## 2022-04-20 ENCOUNTER — Ambulatory Visit (INDEPENDENT_AMBULATORY_CARE_PROVIDER_SITE_OTHER): Payer: Medicare Other | Admitting: Physician Assistant

## 2022-04-20 VITALS — BP 150/80 | HR 76 | Ht 62.0 in | Wt 95.2 lb

## 2022-04-20 DIAGNOSIS — K648 Other hemorrhoids: Secondary | ICD-10-CM | POA: Diagnosis not present

## 2022-04-20 DIAGNOSIS — K5900 Constipation, unspecified: Secondary | ICD-10-CM

## 2022-04-20 DIAGNOSIS — K219 Gastro-esophageal reflux disease without esophagitis: Secondary | ICD-10-CM | POA: Diagnosis not present

## 2022-04-20 DIAGNOSIS — R634 Abnormal weight loss: Secondary | ICD-10-CM | POA: Diagnosis not present

## 2022-04-20 NOTE — Patient Instructions (Signed)
Follow up with Dr [name] in 4 months.  Continue pantoprazole 40 mg every morning.  DISCONTINUE pepcid (famotidine).  Continue Benefiber every morning; dissolve in at last 8 ounces water/juice  Eat 2 prunes every day.  Please purchase the following medications over the counter and take as directed: Miralax 17 grams (1 capful) dissolved in at least 8 ounces water/juice daily  Boost- 2 drinks daily.  Preparation H Suppositories-Use 1 per rectum as needed for rectal bleeding.  _______________________________________________________  If you are age 81 or older, your body mass index should be between 23-30. Your Body mass index is 17.42 kg/m. If this is out of the aforementioned range listed, please consider follow up with your Primary Care Provider.  If you are age 46 or younger, your body mass index should be between 19-25. Your Body mass index is 17.42 kg/m. If this is out of the aformentioned range listed, please consider follow up with your Primary Care Provider.   ________________________________________________________  The [facility] providers would like to encourage you to use [facility] to communicate with providers for non-urgent requests or questions.  Due to long hold times on the telephone, sending your provider a message by [facility] may be a faster and more efficient way to get a response.  Please allow 48 business hours for a response.  Please remember that this is for non-urgent requests.  _______________________________________________________ Due to recent changes in healthcare laws, you may see the results of your imaging and laboratory studies on MyChart before your provider has had a chance to review them.  We understand that in some cases there may be results that are confusing or concerning to you. Not all laboratory results come back in the same time frame and the provider may be waiting for multiple results in order to interpret others.  Please give [place] 48 hours in order  for your provider to thoroughly review all the results before contacting the office for clarification of your results.

## 2022-04-20 NOTE — Progress Notes (Signed)
Subjective:    Patient ID: Cindy Meza, female    DOB: [birth year], 86 y.o.   MRN: [id number]  HPI [name] is a pleasant 86 year old African-American female established with Dr. [name] who comes in today for follow-up, in particular after a recent episode of small-volume rectal bleeding. Patient has history of chronic constipation, previous history of adenomatous colon polyps, and previously documented small internal hemorrhoids.  She also is followed for GERD. She was last seen here in December 2022, and at that time was advised to continue daily PPI, take Benefiber daily and a dose of MiraLAX 17 g in 8 ounces of water daily. Her last colonoscopy was done in February 2019, no polyps found, noted internal hemorrhoids.  Patient says the episode of rectal bleeding occurred a few weeks ago when she scheduled this appointment and she was constipated at that time and having issues with straining.  She began noticing a small amount of bright red blood primarily on the tissue with bowel movements which occurred over a couple of days and has since resolved and not recurred. She still having some problems with constipation and is trying to work out a regimen that will work for her. It sounds as if she is continue to take the Benefiber on a daily basis in the mornings, but has not been taking MiraLAX regularly.  She brought a box of Dulcolax tablets with her and says she used this a couple of times on an as-needed basis for the constipation. No current complaints of abdominal pain, no rectal pain or discomfort.  Appetite is good, she feels that she eats well and healthy but is concerned about weight loss.  Per prior notes her weight has been fluctuating between 97 and 100 over the past couple of years and she weighs 95 today.  Recent labs done by PCP September 2023 with TSH 1.43/c-Met unremarkable, hemoglobin 11.6/hematocrit 34.3   Review of Systems Pertinent positive and negative review of systems  were noted in the above HPI section.  All other review of systems was otherwise negative.   Outpatient Encounter Medications as of 04/20/2022  Medication Sig   aspirin 81 MG tablet Take 81 mg by mouth daily.   Calcium-Magnesium-Vitamin D (CALCIUM 500 PO) Take 1 tablet by mouth 2 (two) times daily with a meal. With lunch and supper   cholecalciferol (VITAMIN D3) 25 MCG (1000 UNIT) tablet Take 1,000 Units by mouth daily.   [employer] OMEGA-3 KRILL OIL 500 MG CAPS Take 500 capsules by mouth daily.   Cyanocobalamin (VITAMIN B-12) 1000 MCG SUBL Place 1 tablet (1,000 mcg total) under the tongue in the morning.   denosumab (PROLIA) 60 MG/ML SOSY injection Inject 60 mg into the skin every 6 (six) months.   estradiol (ESTRACE) 0.1 MG/GM vaginal cream Place vaginally.   fludrocortisone (FLORINEF) 0.1 MG tablet Take 0.5 tablets (0.05 mg total) by mouth daily.   fluticasone-salmeterol (ADVAIR HFA) 115-21 MCG/ACT inhaler Inhale 1-2 puffs into the lungs 2 (two) times daily.   loratadine (CLARITIN) 10 MG tablet Take 10 mg by mouth daily.   pantoprazole (PROTONIX) 40 MG tablet Take 1 tablet (40 mg total) by mouth daily.   Wheat Dextrin (BENEFIBER DRINK MIX PO) Take 1 Capful by mouth daily.   famotidine (PEPCID) 20 MG tablet Take 20 mg by mouth at bedtime. (Patient not taking: Reported on 04/20/2022)   polyethylene glycol (MIRALAX / GLYCOLAX) 17 g packet Take 17 g by mouth daily as needed. (Patient not taking: Reported on 04/20/2022)  No facility-administered encounter medications on file as of 04/20/2022.   Allergies  Allergen Reactions   Fosamax [Alendronate Sodium]     Paresthesias, abd pain   Gabapentin     dizziness   Lidocaine Swelling    Of face and lips.  Novocaine does same thing.   Midodrine     Nausea    Risedronate Sodium     Knee pain   Shellfish Allergy Hives   Statins     REACTION: pains   Patient Active Problem List   Diagnosis Date Noted   Underweight 12/27/2021   Hyperglycemia  12/27/2021   BMI less than 19,adult 06/27/2021   Anemia of chronic disease 03/28/2021   Renal insufficiency 03/28/2021   Weight loss 10/06/2020   Low back pain 07/20/2020   Constipation 04/29/2020   Anxiety 01/03/2020   Rash 07/29/2019   Nausea 02/12/2019   Aortic regurgitation 06/16/2018   Fall 06/12/2016   Closed right hip fracture ([place]) 06/12/2016   Cerumen impaction 04/06/2016   Fatigue 02/15/2016   DOE (dyspnea on exertion) 02/15/2016   Headache 09/27/2015   Coronary atherosclerosis 06/29/2015   Urgency of urination 05/17/2015   Abdominal pain 03/30/2015   Paresthesias 09/21/2014   Breast pain, left 09/21/2014   Chest pressure 07/22/2012   Lightheadedness 07/17/2012   Orthostatic hypotension 07/17/2012   Hot flash, menopausal 02/15/2012   Allergic rhinitis 09/12/2011   Well adult exam 02/14/2011   HEMORRHOIDS, WITH BLEEDING 10/05/2009   Vitamin D deficiency 06/17/2007   Asthma 06/17/2007   GERD (gastroesophageal reflux disease) 06/17/2007   OSTEOARTHRITIS 06/17/2007   Osteoporosis 06/17/2007   COLONIC POLYPS, HX OF 06/17/2007   Social History   Socioeconomic History   Marital status: Widowed    Spouse name: Not on file   Number of children: Not on file   Years of education: Not on file   Highest education level: Not on file  Occupational History   Occupation: retired  Tobacco Use   Smoking status: Never   Smokeless tobacco: Never  Vaping Use   Vaping Use: Never used  Substance and Sexual Activity   Alcohol use: Not Currently    Alcohol/week: 0.0 standard drinks of alcohol   Drug use: No   Sexual activity: Not Currently  Other Topics Concern   Not on file  Social History Narrative   She lives alone in a [place] home.  No children.   Retired Education officer, museum.   Highest level of education:  Masters degree   Social Determinants of Health   Financial Resource Strain: Low Risk  (08/25/2021)   Overall Financial Resource Strain (CARDIA)    Difficulty of  Paying Living Expenses: Not hard at all  Food Insecurity: No Food Insecurity (08/25/2021)   Hunger Vital Sign    Worried About Running Out of Food in the Last Year: Never true    Ran Out of Food in the Last Year: Never true  Transportation Needs: No Transportation Needs (08/25/2021)   PRAPARE - Hydrologist (Medical): No    Lack of Transportation (Non-Medical): No  Physical Activity: Inactive (08/25/2021)   Exercise Vital Sign    Days of Exercise per Week: 0 days    Minutes of Exercise per Session: 0 min  Stress: No Stress Concern Present (08/25/2021)   [place]    Feeling of Stress : Not at all  Social Connections: Moderately Isolated (08/25/2021)   Social Connection and Isolation Panel [  NHANES]    Frequency of Communication with Friends and Family: More than three times a week    Frequency of Social Gatherings with Friends and Family: Three times a week    Attends Religious Services: More than 4 times per year    Active Member of Clubs or Organizations: No    Attends Archivist Meetings: Never    Marital Status: Widowed  Intimate Partner Violence: Not At Risk (08/25/2021)   Humiliation, Afraid, Rape, and Kick questionnaire    Fear of Current or Ex-Partner: No    Emotionally Abused: No    Physically Abused: No    Sexually Abused: No    Ms. Smick's family history includes Diabetes (age of onset: 33) in her mother; Hypertension in an other family member.      Objective:    Vitals:   04/20/22 1051  BP: (!) 150/80  Pulse: 76  SpO2: 97%    Physical Exam Well-developed well-nourished elderly AA female in no acute distress.  Height, Weight,95 BMI 17.4  HEENT; nontraumatic normocephalic, EOMI, PE R LA, sclera anicteric. Oropharynx;not examined Neck; supple, no JVD Cardiovascular; regular rate and rhythm with S1-S2, no murmur rub or gallop Pulmonary; Clear  bilaterally Abdomen; soft, nontender, nondistended, no palpable mass or hepatosplenomegaly, bowel sounds are active Rectal;not done Skin; benign exam, no jaundice rash or appreciable lesions Extremities; no clubbing cyanosis or edema skin warm and dry Neuro/Psych; alert and oriented x4, grossly nonfocal mood and affect appropriate        Assessment & Plan:   #14 86 year old female with chronic constipation and recent episode of small-volume hematochezia consistent with bleeding secondary to internal hemorrhoids which have been previously documented.  #2 chronic GERD-stable on Protonix 40 mg p.o. every morning-not currently using Pepcid in the evening. #3 osteoarthritis #4.  History of orthostatic hypotension #5 asthma #6 history of adenomatous colon polyps-last colonoscopy 2019, no polyps and no further surveillance colonoscopies planned due to age  Plan; continue Benefiber 1 dose daily in a glass of water which she is taking in the morning We discussed adding a couple of prunes daily or drinking a glass of prune juice each morning Restart MiraLAX 17 g in 8 ounces of water each evening We will continue Protonix 40 mg p.o. every morning AC breakfast Stop famotidine which she had not been using regularly for a while. Preparation H suppositories OTC use nightly for 5 to 7 days if further episodes of rectal bleeding secondary to internal hemorrhoids. She is wanting to try to gain a little bit of weight, we discussed use of boost high-protein BID to increase her calorie count. Follow-up in 4 months Dr. [name] or myself.   Percy Winterrowd S Kinnedy Mongiello PA-C 04/20/2022   Cc: [name], Evie Lacks, MD

## 2022-04-27 LAB — HM MAMMOGRAPHY

## 2022-04-30 ENCOUNTER — Encounter: Payer: Self-pay | Admitting: Internal Medicine

## 2022-05-07 NOTE — Telephone Encounter (Signed)
Patient is scheduled for Prolia injection on 05/29/2022 [facility]).

## 2022-05-09 LAB — HM MAMMOGRAPHY

## 2022-05-10 ENCOUNTER — Encounter: Payer: Self-pay | Admitting: Internal Medicine

## 2022-05-17 NOTE — Progress Notes (Unsigned)
Subjective:    Patient ID: Cindy Meza, female    DOB: [birth year], 86 y.o.   MRN: [id number]      HPI Cindy Meza is here for  Chief Complaint  Patient presents with   Extremity Weakness    Thighs down into legs; She feels gittery and nauseous at times; SOB     For about one week has felt wobbly, off balanced.  Her legs feel and like she may fall.  ? Side effects of medications -- no changes recently.  She starts to feel bad after taking her meds in the morning.  No changes in medication.  In am takes protonix, florinef and B12.     At lunch takes ASA, calcium At dinner takes krill oil and vit D  The wobbly feeling is only when she is up and moving around.  Does not feel it when laying down.  She has not felt it today.   She denies lightheadedness.  Occ SOB, which is not new  She drinks water - feels she drinking a good amount.    She has not felt wobbly or weak today yet.   BP at home 120's/70's   Medications and allergies reviewed with patient and updated if appropriate.  Current Outpatient Medications on File Prior to Visit  Medication Sig Dispense Refill   aspirin 81 MG tablet Take 81 mg by mouth daily.     Calcium-Magnesium-Vitamin D (CALCIUM 500 PO) Take 1 tablet by mouth 2 (two) times daily with a meal. With lunch and supper     cholecalciferol (VITAMIN D3) 25 MCG (1000 UNIT) tablet Take 1,000 Units by mouth daily.     [employer] OMEGA-3 KRILL OIL 500 MG CAPS Take 500 capsules by mouth daily.     Cyanocobalamin (VITAMIN B-12) 1000 MCG SUBL Place 1 tablet (1,000 mcg total) under the tongue in the morning. 100 tablet 3   denosumab (PROLIA) 60 MG/ML SOSY injection Inject 60 mg into the skin every 6 (six) months.     estradiol (ESTRACE) 0.1 MG/GM vaginal cream Place vaginally.     fludrocortisone (FLORINEF) 0.1 MG tablet Take 0.5 tablets (0.05 mg total) by mouth daily. 45 tablet 3   fluticasone-salmeterol (ADVAIR HFA) 115-21 MCG/ACT inhaler Inhale 1-2 puffs into the  lungs 2 (two) times daily. 12 g 11   loratadine (CLARITIN) 10 MG tablet Take 10 mg by mouth daily.     pantoprazole (PROTONIX) 40 MG tablet Take 1 tablet (40 mg total) by mouth daily. 90 tablet 3   polyethylene glycol (MIRALAX / GLYCOLAX) 17 g packet Take 17 g by mouth daily as needed.     Wheat Dextrin (BENEFIBER DRINK MIX PO) Take 1 Capful by mouth daily.     No current facility-administered medications on file prior to visit.    Review of Systems  Constitutional:  Negative for chills and fever.  HENT:  Negative for congestion, sinus pain and sore throat.   Respiratory:  Positive for shortness of breath (with exertion - chronic - not new). Negative for cough and wheezing.   Cardiovascular:  Positive for palpitations (occ). Negative for chest pain and leg swelling.  Genitourinary:  Negative for dysuria.  Musculoskeletal:  Positive for arthralgias (some hip discomfort, no knee pain) and back pain (mild).  Neurological:  Positive for dizziness (occ - if she gets up too fast). Negative for light-headedness, numbness and headaches.       Objective:   Vitals:   05/18/22 1135 05/18/22 1136  BP: 126/76 130/74  Pulse:    Temp:    SpO2:     BP Readings from Last 3 Encounters:  05/18/22 130/74  04/20/22 (!) 150/80  03/28/22 (!) 140/72   Wt Readings from Last 3 Encounters:  05/18/22 93 lb 12.8 oz (42.5 kg)  04/20/22 95 lb 4 oz (43.2 kg)  03/28/22 96 lb (43.5 kg)   Body mass index is 17.16 kg/m.    Physical Exam Constitutional:      General: She is not in acute distress.    Appearance: Normal appearance.  HENT:     Head: Normocephalic and atraumatic.  Eyes:     Conjunctiva/sclera: Conjunctivae normal.  Cardiovascular:     Rate and Rhythm: Normal rate and regular rhythm.     Heart sounds: Normal heart sounds. No murmur heard. Pulmonary:     Effort: Pulmonary effort is normal. No respiratory distress.     Breath sounds: Normal breath sounds. No wheezing.  Abdominal:      General: There is no distension.     Palpations: Abdomen is soft.     Tenderness: There is no abdominal tenderness.  Musculoskeletal:     Cervical back: Neck supple.     Right lower leg: No edema.     Left lower leg: No edema.  Lymphadenopathy:     Cervical: No cervical adenopathy.  Skin:    General: Skin is warm and dry.     Findings: No rash.  Neurological:     Mental Status: She is alert. Mental status is at baseline.  Psychiatric:        Mood and Affect: Mood normal.        Behavior: Behavior normal.            Assessment & Plan:    Weakness, unsteadiness: Acute-she states the symptoms started a week ago, but she has had this in the past Symptoms very nondescript-no lightheadedness and typically only occurs later in the day when she is up and moving around She denies dehydration She is orthostatic here and that is likely contributing No signs of infection No changes in medication recently She states she is eating good, but weight is down slightly-she states this is stable at home EKG today shows undetermined rhythm-looks like sinus arrhythmia, Possible septal infarct, Otherwise normal EKG-no change compared to EKG from January 2023 We will check CBC, CMP -expect these to be normal Continue Florinef 0.05 mg daily If blood work normal may need to increase Florinef-she was on a higher dose in the past  Abnormal rhythm on exam: EKG shows undetermined rhythm which looks like sinus arrhythmia, possible septal infarct, otherwise normal EKG-no change compared to EKG from January 2023 Unlikely related to weakness and unsteadiness No further evaluation necessary She does see cardiology

## 2022-05-18 ENCOUNTER — Encounter: Payer: Self-pay | Admitting: Internal Medicine

## 2022-05-18 ENCOUNTER — Ambulatory Visit (INDEPENDENT_AMBULATORY_CARE_PROVIDER_SITE_OTHER): Payer: Medicare Other | Admitting: Internal Medicine

## 2022-05-18 VITALS — BP 130/74 | HR 79 | Temp 97.9°F | Ht 62.0 in | Wt 93.8 lb

## 2022-05-18 DIAGNOSIS — I499 Cardiac arrhythmia, unspecified: Secondary | ICD-10-CM

## 2022-05-18 DIAGNOSIS — R531 Weakness: Secondary | ICD-10-CM | POA: Diagnosis not present

## 2022-05-18 LAB — COMPREHENSIVE METABOLIC PANEL
ALT: 14 U/L (ref 0–35)
AST: 22 U/L (ref 0–37)
Albumin: 4.1 g/dL (ref 3.5–5.2)
Alkaline Phosphatase: 32 U/L — ABNORMAL LOW (ref 39–117)
BUN: 18 mg/dL (ref 6–23)
CO2: 32 mEq/L (ref 19–32)
Calcium: 9.6 mg/dL (ref 8.4–10.5)
Chloride: 102 mEq/L (ref 96–112)
Creatinine, Ser: 1.2 mg/dL (ref 0.40–1.20)
GFR: 41.62 mL/min — ABNORMAL LOW (ref >60.00)
Glucose, Bld: 90 mg/dL (ref 70–99)
Potassium: 4.1 mEq/L (ref 3.5–5.1)
Sodium: 138 mEq/L (ref 135–145)
Total Bilirubin: 0.4 mg/dL (ref 0.2–1.2)
Total Protein: 7.3 g/dL (ref 6.0–8.3)

## 2022-05-18 LAB — CBC WITH DIFFERENTIAL/PLATELET
Basophils Absolute: 0 10*3/uL (ref 0.0–0.1)
Basophils Relative: 0.8 % (ref 0.0–3.0)
Eosinophils Absolute: 0.1 10*3/uL (ref 0.0–0.7)
Eosinophils Relative: 1.4 % (ref 0.0–5.0)
HCT: 34.8 % — ABNORMAL LOW (ref 36.0–46.0)
Hemoglobin: 11.5 g/dL — ABNORMAL LOW (ref 12.0–15.0)
Lymphocytes Relative: 38.1 % (ref 12.0–46.0)
Lymphs Abs: 2.3 10*3/uL (ref 0.7–4.0)
MCHC: 32.9 g/dL (ref 30.0–36.0)
MCV: 94 fl (ref 78.0–100.0)
Monocytes Absolute: 0.5 10*3/uL (ref 0.1–1.0)
Monocytes Relative: 8.5 % (ref 3.0–12.0)
Neutro Abs: 3 10*3/uL (ref 1.4–7.7)
Neutrophils Relative %: 51.2 % (ref 43.0–77.0)
Platelets: 224 10*3/uL (ref 150.0–400.0)
RBC: 3.7 Mil/uL — ABNORMAL LOW (ref 3.87–5.11)
RDW: 13 % (ref 11.5–15.5)
WBC: 5.9 10*3/uL (ref 4.0–10.5)

## 2022-05-18 NOTE — Patient Instructions (Addendum)
     An EKG was done.     Blood work was ordered.   The lab is on the first floor.    Medications changes include :   none     Return if symptoms worsen or fail to improve.

## 2022-05-19 ENCOUNTER — Telehealth: Payer: Self-pay | Admitting: Internal Medicine

## 2022-05-19 NOTE — Telephone Encounter (Signed)
EKG is stable.  Blood work is stable.  If she still having weakness and unsteadiness symptoms she may want to increase the Florinef back to twice daily which she did do in the past.  Her blood pressure drops when she stands and it could be causing some of her symptoms.  She may want to try that for a few days and see if that helps.  I know she is following with Dr. Harrington Challenger and can discuss with her more.

## 2022-05-21 NOTE — Telephone Encounter (Signed)
Spoke with patient today. 

## 2022-05-23 ENCOUNTER — Encounter: Payer: Self-pay | Admitting: Internal Medicine

## 2022-05-24 ENCOUNTER — Encounter: Payer: Self-pay | Admitting: Internal Medicine

## 2022-05-24 NOTE — Progress Notes (Signed)
Subjective:    Patient ID: Cindy Meza, female    DOB: 1935/07/21, 86 y.o.   MRN: [id number]      HPI Cindy Meza is here for  Chief Complaint  Patient presents with   Dizziness    Dizziness and off balance still; Still feels like it is coming from one of her medications    Unsteadiness-feeling like she will fall.  She was here 1 week ago.  Blood work at that time was unremarkable.  She was orthostatic.  I advised her to increase her Florinef to one pill daily to see if that would help.  At 1 point she was on this dose. She is not sure if it helped or not.  She continues to feel unsteady and has a feeling she is going to fall.  She denies any falls.  She states she feels off balance and jittery.  She denies any lightheadedness or dizziness.  She states that her legs feel weak when she is walking.  Typically this only occurs later in the day-not in the morning.  This is why she thinks it may be related to one of her medications or supplements.  She said maybe it feels like her legs are going to give out-she is not sure.  She denies any back pain or knee pain with walking.  If she stands for long period of time she may have some back pain that resolves with sitting.    BP at home 104/62 - 150/90 - BP variable - only 2/ 10 readings SBP more than 140   Discomfort around waist line on the left side into her stomach.   Medications and allergies reviewed with patient and updated if appropriate.  Current Outpatient Medications on File Prior to Visit  Medication Sig Dispense Refill   aspirin 81 MG tablet Take 81 mg by mouth daily.     Calcium-Magnesium-Vitamin D (CALCIUM 500 PO) Take 1 tablet by mouth 2 (two) times daily with a meal. With lunch and supper     cholecalciferol (VITAMIN D3) 25 MCG (1000 UNIT) tablet Take 1,000 Units by mouth daily.     [employer]S OMEGA-3 KRILL OIL 500 MG CAPS Take 500 capsules by mouth daily.     Cyanocobalamin (VITAMIN B-12) 1000 MCG SUBL Place 1 tablet  (1,000 mcg total) under the tongue in the morning. 100 tablet 3   denosumab (PROLIA) 60 MG/ML SOSY injection Inject 60 mg into the skin every 6 (six) months.     estradiol (ESTRACE) 0.1 MG/GM vaginal cream Place vaginally.     fludrocortisone (FLORINEF) 0.1 MG tablet Take 0.5 tablets (0.05 mg total) by mouth daily. 45 tablet 3   fluticasone-salmeterol (ADVAIR HFA) 115-21 MCG/ACT inhaler Inhale 1-2 puffs into the lungs 2 (two) times daily. 12 g 11   loratadine (CLARITIN) 10 MG tablet Take 10 mg by mouth daily.     pantoprazole (PROTONIX) 40 MG tablet Take 1 tablet (40 mg total) by mouth daily. 90 tablet 3   polyethylene glycol (MIRALAX / GLYCOLAX) 17 g packet Take 17 g by mouth daily as needed.     Wheat Dextrin (BENEFIBER DRINK MIX PO) Take 1 Capful by mouth daily.     No current facility-administered medications on file prior to visit.    Review of Systems  Respiratory:  Negative for shortness of breath.   Cardiovascular:  Negative for chest pain, palpitations and leg swelling.  Musculoskeletal:  Negative for arthralgias and back pain.  Neurological:  Positive for  weakness (legs). Negative for dizziness, light-headedness and numbness.       Objective:   Vitals:   05/25/22 1026  BP: 134/68  Pulse: 80  Temp: 97.9 F (36.6 C)  SpO2: 95%   BP Readings from Last 3 Encounters:  05/25/22 134/68  05/18/22 130/74  04/20/22 (!) 150/80   Wt Readings from Last 3 Encounters:  05/25/22 96 lb 9.6 oz (43.8 kg)  05/18/22 93 lb 12.8 oz (42.5 kg)  04/20/22 95 lb 4 oz (43.2 kg)   Body mass index is 17.67 kg/m.    Physical Exam Constitutional:      General: She is not in acute distress.    Appearance: Normal appearance.  HENT:     Head: Normocephalic and atraumatic.  Eyes:     Conjunctiva/sclera: Conjunctivae normal.  Cardiovascular:     Rate and Rhythm: Normal rate and regular rhythm.     Heart sounds: Normal heart sounds. No murmur heard. Pulmonary:     Effort: Pulmonary effort  is normal. No respiratory distress.     Breath sounds: Normal breath sounds. No wheezing.  Musculoskeletal:     Cervical back: Neck supple.     Right lower leg: No edema.     Left lower leg: No edema.  Lymphadenopathy:     Cervical: No cervical adenopathy.  Skin:    General: Skin is warm and dry.     Findings: No rash.  Neurological:     Mental Status: She is alert. Mental status is at baseline.     Sensory: No sensory deficit.     Motor: No weakness.     Gait: Gait abnormal (Slightly unsteady with walking, especially when she first stands up).  Psychiatric:        Mood and Affect: Mood normal.        Behavior: Behavior normal.            Assessment & Plan:    Orthostatic hypotension: Advised her to increase her Florinef to 1 pill daily to see if that would help-she is not sure if that helped or not She still has unsteadiness and feeling like she is going to fall Blood pressure readings from home reasonable and today's BP looks good Continue Florinef 0.1 mg daily  Unsteadiness, subjective leg weakness: Started a few weeks ago-feels unsteady typically later in the day and feels like she is in the fall-her legs do feel weak No weakness on exam-weakness seems subjective Blood work last week normal Does not appear to be related to drop in BP ?  Neurological-we will refer to neurology for their input

## 2022-05-25 ENCOUNTER — Encounter: Payer: Self-pay | Admitting: Internal Medicine

## 2022-05-25 ENCOUNTER — Ambulatory Visit (INDEPENDENT_AMBULATORY_CARE_PROVIDER_SITE_OTHER): Payer: Medicare Other | Admitting: Internal Medicine

## 2022-05-25 VITALS — BP 134/68 | HR 80 | Temp 97.9°F | Ht 62.0 in | Wt 96.6 lb

## 2022-05-25 DIAGNOSIS — I951 Orthostatic hypotension: Secondary | ICD-10-CM

## 2022-05-25 DIAGNOSIS — R29898 Other symptoms and signs involving the musculoskeletal system: Secondary | ICD-10-CM | POA: Diagnosis not present

## 2022-05-25 DIAGNOSIS — R2681 Unsteadiness on feet: Secondary | ICD-10-CM

## 2022-05-25 MED ORDER — FLUDROCORTISONE ACETATE 0.1 MG PO TABS: 0.1000 mg | ORAL_TABLET | Freq: Every day | ORAL | 3 refills | Status: DC

## 2022-05-25 NOTE — Patient Instructions (Addendum)
     Medications changes include :  none     A referral was ordered for Neurology.     Someone will call you to schedule an appointment.

## 2022-05-29 ENCOUNTER — Ambulatory Visit (INDEPENDENT_AMBULATORY_CARE_PROVIDER_SITE_OTHER): Payer: Medicare Other

## 2022-05-29 DIAGNOSIS — M810 Age-related osteoporosis without current pathological fracture: Secondary | ICD-10-CM | POA: Diagnosis not present

## 2022-05-29 MED ORDER — DENOSUMAB 60 MG/ML ~~LOC~~ SOSY
60.0000 mg | PREFILLED_SYRINGE | Freq: Once | SUBCUTANEOUS | Status: AC
  Administered 2022-05-29: 60 mg via SUBCUTANEOUS

## 2022-05-29 NOTE — Progress Notes (Signed)
Patient verbally confirmed name, date of birth, and correct medication to be administered. Prolia injection administered and pt tolerated well.  

## 2022-06-11 NOTE — Progress Notes (Unsigned)
Cardiology Office Note   Date:  06/12/2022   ID:  Cindy Meza, DOB 16-Aug-1935, MRN [id number]  PCP:  Cassandria Anger, MD  Cardiologist:   Dorris Carnes, MD   Pt presents for f/u of orthostatic hypotension    History of Present Illness: Cindy Meza is a 86 y.o. female with a history  fatigue and orthostatic intolerance.Treated with florinef  ALso a hx of mitral regurgitation, asthma, GERD   Myovue in 2017 and 2019 were normal   Echo with LVEF 50 to 55% with mild diastolic dysfunction and mod MR   SHe has been taking Crestor 1x per week   Cut back due to consitpation  I saw the pt in clinic in Jan 2023   [facility] was seen by Elvin So in Aug 2023 The pt has some unsteadiness  INtermittent.   Last week getting up to stand got unsteady     Denies presyncope/syncope At home BP 100s to 150s   Average 130s    The pt has occsaional discomfort in back with standing    No associated unsteadiness  She complains of wt loss   Says she is eating OK    Taking Mirlax ofr constipation.   Helps     Urinates 3 to 4x per night      Patient doesn't walk much   After injury 5 or 6 years ago, legs uneven   Pt walks to mailbox   Will get SOB  Outpatient Medications Prior to Visit  Medication Sig Dispense Refill   aspirin 81 MG tablet Take 81 mg by mouth daily.     Calcium-Magnesium-Vitamin D (CALCIUM 500 PO) Take 1 tablet by mouth 2 (two) times daily with a meal. With lunch and supper     cholecalciferol (VITAMIN D3) 25 MCG (1000 UNIT) tablet Take 1,000 Units by mouth daily.     [employer] OMEGA-3 KRILL OIL 500 MG CAPS Take 500 capsules by mouth daily.     Cyanocobalamin (VITAMIN B-12) 1000 MCG SUBL Place 1 tablet (1,000 mcg total) under the tongue in the morning. 100 tablet 3   denosumab (PROLIA) 60 MG/ML SOSY injection Inject 60 mg into the skin every 6 (six) months.     estradiol (ESTRACE) 0.1 MG/GM vaginal cream Place vaginally.     fludrocortisone (FLORINEF) 0.1 MG tablet Take 1 tablet (0.1  mg total) by mouth daily. 90 tablet 3   fluticasone-salmeterol (ADVAIR HFA) 115-21 MCG/ACT inhaler Inhale 1-2 puffs into the lungs 2 (two) times daily. 12 g 11   loratadine (CLARITIN) 10 MG tablet Take 10 mg by mouth daily.     pantoprazole (PROTONIX) 40 MG tablet Take 1 tablet (40 mg total) by mouth daily. 90 tablet 3   polyethylene glycol (MIRALAX / GLYCOLAX) 17 g packet Take 17 g by mouth daily as needed.     Wheat Dextrin (BENEFIBER DRINK MIX PO) Take 1 Capful by mouth daily.     No facility-administered medications prior to visit.     Allergies:   Fosamax [alendronate sodium], Gabapentin, Lidocaine, Midodrine, Risedronate sodium, Shellfish allergy, and Statins   Past Medical History:  Diagnosis Date   Allergy    Anemia    Asthma    Dr. Melvyn Novas   Blood transfusion without reported diagnosis    Breast cyst    Left   Cataract    Dr. Gershon Crane   Complication of anesthesia    ' I HAD [place] DID MY HIP "  GERD (gastroesophageal reflux disease)    Heart murmur    Hemorrhoids    History of colonic polyps [place]   hx of adenomas (small) 1998   OA (osteoarthritis)    Orthostatic hypotension    Osteopenia    PAC (premature atrial contraction)    Vitamin D deficiency     Past Surgical History:  Procedure Laterality Date   ABDOMINAL HYSTERECTOMY     complete   APPENDECTOMY     BREAST CYST EXCISION     CATARACT EXTRACTION, BILATERAL     04/23/18 left eye, 04/02/18 right eye   COLONOSCOPY  Multiple   Adenomatous colon polyps   ESOPHAGOGASTRODUODENOSCOPY  2010   INTRAMEDULLARY (IM) NAIL INTERTROCHANTERIC Right 06/13/2016   Procedure: INTRAMEDULLARY (IM) NAIL INTERTROCHANTRIC;  Surgeon: Tania Ade, MD;  Location: [place];  Service: Orthopedics;  Laterality: Right;   TONSILLECTOMY       Social History:  The patient  reports that she has never smoked. She has never used smokeless tobacco. She reports that she does not currently use alcohol. She reports that  she does not use drugs.   Family History:  The patient's family history includes Diabetes (age of onset: 35) in her mother; Hypertension in an other family member.  No history of CAD known    Mother had PPM    ROS:  Please see the history of present illness. All other systems are reviewed and  Negative to the above problem except as noted.    PHYSICAL EXAM: VS:  BP 120/62   Pulse 84   Ht '5\' 2"'$  (1.575 m)   Wt 95 lb 3.2 oz (43.2 kg)   SpO2 93%   BMI 17.41 kg/m     GEN: Thin 86 year old in no acute distress  HEENT: normal  Neck: JVP is normal  No carotid bruits Cardiac: RRR; no murmurs.  No LE  edema  Respiratory:  clear to auscultation   GI: soft, nontender, nondistended,  MS: no deformity Moving all extremities   Skin: warm and dry, no rash Neuro:  Strength and sensation are intact Psych: euthymic mood, full affect   EKG:  EKG is not ordered  Lipid Panel    Component Value Date/Time   CHOL 227 (H) 07/12/2021 1222   TRIG 45 07/12/2021 1222   HDL 107 07/12/2021 1222   CHOLHDL 2.1 07/12/2021 1222   CHOLHDL 2 04/10/2018 1207   VLDL 13.6 04/10/2018 1207   LDLCALC 112 (H) 07/12/2021 1222   LDLDIRECT 104.0 03/17/2013 1026      Wt Readings from Last 3 Encounters:  06/12/22 95 lb 3.2 oz (43.2 kg)  05/25/22 96 lb 9.6 oz (43.8 kg)  05/18/22 93 lb 12.8 oz (42.5 kg)      ASSESSMENT AND PLAN:  1  Orthostatic hypotension  Pt is doing OK    Some dizziness   She is taking florinef 0.1 mg in am   I sugg she take 0.05 at 8 and 2 pm   2  CAD Plaquing of aorta and coronary arteries  Normal myovue in Dec 2019 Continues to have no symptoms    On ASA     Intolerant to statins    3 Hyperlipidemia\ Pt not on a statin   Had been achy   LDL 129  HDL 109   W  ill check on coverage for Repatha or Levqio  F/U next summer     Current medicines are reviewed at length with the patient today.  The patient does  not have concerns regarding medicines.   Signed, Dorris Carnes, MD   06/12/2022 1:52 PM    [facility] [place], [place], [place]  [address] Phone: [phone]; Fax: [phone]

## 2022-06-12 ENCOUNTER — Telehealth: Payer: Self-pay

## 2022-06-12 ENCOUNTER — Encounter: Payer: Self-pay | Admitting: Internal Medicine

## 2022-06-12 ENCOUNTER — Ambulatory Visit: Payer: Medicare Other | Attending: Internal Medicine | Admitting: Internal Medicine

## 2022-06-12 VITALS — BP 120/62 | HR 84 | Ht 62.0 in | Wt 95.2 lb

## 2022-06-12 DIAGNOSIS — I951 Orthostatic hypotension: Secondary | ICD-10-CM | POA: Diagnosis not present

## 2022-06-12 MED ORDER — FLUDROCORTISONE ACETATE 0.1 MG PO TABS: 0.0500 mg | ORAL_TABLET | Freq: Two times a day (BID) | ORAL | 3 refills | Status: DC

## 2022-06-12 NOTE — Patient Instructions (Addendum)
Medication Instructions:  Start Florinef 0.05 mg at 8 am and 2 pm daily  *If you need a refill on your cardiac medications before your next appointment, please call your pharmacy*   Lab Work:  If you have labs (blood work) drawn today and your tests are completely normal, you will receive your results only by: [place] (if you have MyChart) OR A paper copy in the mail If you have any lab test that is abnormal or we need to change your treatment, we will call you to review the results.   Testing/Procedures:    Follow-Up: May 2024 At [facility], you and your health needs are our priority.  As part of our continuing mission to provide you with exceptional heart care, we have created designated Provider Care Teams.  These Care Teams include your primary Cardiologist (physician) and Advanced Practice Providers (APPs -  Physician Assistants and Nurse Practitioners) who all work together to provide you with the care you need, when you need it.  We recommend signing up for the patient portal called "MyChart".  Sign up information is provided on this After Visit Summary.  MyChart is used to connect with patients for Virtual Visits (Telemedicine).  Patients are able to view lab/test results, encounter notes, upcoming appointments, etc.  Non-urgent messages can be sent to your provider as well.   To learn more about what you can do with MyChart, go to [email or web address].    (Call us in a few weeks and let us know how you are doing.)   Important Information About Sugar

## 2022-06-12 NOTE — Telephone Encounter (Signed)
Pt seen today 06/12/22 and given new Rx for florinef... and I advised her that I will call her the week after Xmas to see how she is doing.

## 2022-06-22 ENCOUNTER — Ambulatory Visit: Payer: Medicare Other | Admitting: Neurology

## 2022-06-23 NOTE — Telephone Encounter (Signed)
Last Prolia inj 05/29/22 Next Prolia inj due 11/28/22

## 2022-06-28 ENCOUNTER — Ambulatory Visit (INDEPENDENT_AMBULATORY_CARE_PROVIDER_SITE_OTHER): Payer: Medicare Other | Admitting: Internal Medicine

## 2022-06-28 ENCOUNTER — Encounter: Payer: Self-pay | Admitting: Internal Medicine

## 2022-06-28 VITALS — BP 128/72 | HR 41 | Temp 97.7°F | Ht 62.0 in | Wt 94.0 lb

## 2022-06-28 DIAGNOSIS — I2510 Atherosclerotic heart disease of native coronary artery without angina pectoris: Secondary | ICD-10-CM

## 2022-06-28 DIAGNOSIS — M810 Age-related osteoporosis without current pathological fracture: Secondary | ICD-10-CM

## 2022-06-28 DIAGNOSIS — E559 Vitamin D deficiency, unspecified: Secondary | ICD-10-CM

## 2022-06-28 DIAGNOSIS — M545 Low back pain, unspecified: Secondary | ICD-10-CM

## 2022-06-28 DIAGNOSIS — R634 Abnormal weight loss: Secondary | ICD-10-CM

## 2022-06-28 DIAGNOSIS — F419 Anxiety disorder, unspecified: Secondary | ICD-10-CM

## 2022-06-28 DIAGNOSIS — G8929 Other chronic pain: Secondary | ICD-10-CM

## 2022-06-28 DIAGNOSIS — N289 Disorder of kidney and ureter, unspecified: Secondary | ICD-10-CM | POA: Diagnosis not present

## 2022-06-28 NOTE — Assessment & Plan Note (Signed)
Doing fair 

## 2022-06-28 NOTE — Patient Instructions (Signed)
Blue-Emu cream -- use 2-3 times a day ? ?

## 2022-06-28 NOTE — Assessment & Plan Note (Signed)
Blue-Emu cream was recommended to use 2-3 times a day ? ?

## 2022-06-28 NOTE — Progress Notes (Signed)
Subjective:  Patient ID: Cindy Meza, female    DOB: 1936/07/09  Age: 86 y.o. MRN: [id number]  CC: Follow-up (Concerns about side effects from medications and weight )   HPI Cindy Meza presents for meds side effects, not new F/u osteoporosis, dizziness  Outpatient Medications Prior to Visit  Medication Sig Dispense Refill   aspirin 81 MG tablet Take 81 mg by mouth daily.     Calcium-Magnesium-Vitamin D (CALCIUM 500 PO) Take 1 tablet by mouth 2 (two) times daily with a meal. With lunch and supper     cholecalciferol (VITAMIN D3) 25 MCG (1000 UNIT) tablet Take 1,000 Units by mouth daily.     [employer] OMEGA-3 KRILL OIL 500 MG CAPS Take 500 capsules by mouth daily.     Cyanocobalamin (VITAMIN B-12) 1000 MCG SUBL Place 1 tablet (1,000 mcg total) under the tongue in the morning. 100 tablet 3   denosumab (PROLIA) 60 MG/ML SOSY injection Inject 60 mg into the skin every 6 (six) months.     estradiol (ESTRACE) 0.1 MG/GM vaginal cream Place vaginally.     fludrocortisone (FLORINEF) 0.1 MG tablet Take 0.5 tablets (0.05 mg total) by mouth 2 (two) times daily. At 8 am and 2 pm 180 tablet 3   fluticasone-salmeterol (ADVAIR HFA) 115-21 MCG/ACT inhaler Inhale 1-2 puffs into the lungs 2 (two) times daily. 12 g 11   loratadine (CLARITIN) 10 MG tablet Take 10 mg by mouth daily.     pantoprazole (PROTONIX) 40 MG tablet Take 1 tablet (40 mg total) by mouth daily. 90 tablet 3   polyethylene glycol (MIRALAX / GLYCOLAX) 17 g packet Take 17 g by mouth daily as needed.     Wheat Dextrin (BENEFIBER DRINK MIX PO) Take 1 Capful by mouth daily.     No facility-administered medications prior to visit.    ROS: Review of Systems  Constitutional:  Negative for activity change, appetite change, chills, fatigue and unexpected weight change.  HENT:  Negative for congestion, mouth sores and sinus pressure.   Eyes:  Negative for visual disturbance.  Respiratory:  Negative for cough and chest tightness.    Gastrointestinal:  Negative for abdominal pain and nausea.  Genitourinary:  Negative for difficulty urinating, frequency and vaginal pain.  Musculoskeletal:  Positive for arthralgias. Negative for back pain and gait problem.  Skin:  Negative for pallor and rash.  Neurological:  Negative for dizziness, tremors, weakness, numbness and headaches.  Psychiatric/Behavioral:  Negative for confusion, sleep disturbance and suicidal ideas.     Objective:  BP 128/72 (BP Location: Left Arm, Patient Position: Sitting, Cuff Size: Normal)   Pulse (!) 41   Temp 97.7 F (36.5 C) (Oral)   Ht '5\' 2"'$  (1.575 m)   Wt 94 lb (42.6 kg)   SpO2 97%   BMI 17.19 kg/m   BP Readings from Last 3 Encounters:  06/28/22 128/72  06/12/22 120/62  05/25/22 134/68    Wt Readings from Last 3 Encounters:  06/28/22 94 lb (42.6 kg)  06/12/22 95 lb 3.2 oz (43.2 kg)  05/25/22 96 lb 9.6 oz (43.8 kg)    Physical Exam Constitutional:      General: She is not in acute distress.    Appearance: She is well-developed.  HENT:     Head: Normocephalic.     Right Ear: External ear normal.     Left Ear: External ear normal.     Nose: Nose normal.  Eyes:     General:  Right eye: No discharge.        Left eye: No discharge.     Conjunctiva/sclera: Conjunctivae normal.     Pupils: Pupils are equal, round, and reactive to light.  Neck:     Thyroid: No thyromegaly.     Vascular: No JVD.     Trachea: No tracheal deviation.  Cardiovascular:     Rate and Rhythm: Normal rate and regular rhythm.     Heart sounds: Normal heart sounds.  Pulmonary:     Effort: No respiratory distress.     Breath sounds: No stridor. No wheezing.  Abdominal:     General: Bowel sounds are normal. There is no distension.     Palpations: Abdomen is soft. There is no mass.     Tenderness: There is no abdominal tenderness. There is no guarding or rebound.  Musculoskeletal:        General: No tenderness.     Cervical back: Normal range of  motion and neck supple. No rigidity.  Lymphadenopathy:     Cervical: No cervical adenopathy.  Skin:    Findings: No erythema or rash.  Neurological:     Cranial Nerves: No cranial nerve deficit.     Motor: No abnormal muscle tone.     Coordination: Coordination normal.     Deep Tendon Reflexes: Reflexes normal.  Psychiatric:        Behavior: Behavior normal.        Thought Content: Thought content normal.        Judgment: Judgment normal.     Lab Results  Component Value Date   WBC 5.9 05/18/2022   HGB 11.5 (L) 05/18/2022   HCT 34.8 (L) 05/18/2022   PLT 224.0 05/18/2022   GLUCOSE 90 05/18/2022   CHOL 227 (H) 07/12/2021   TRIG 45 07/12/2021   HDL 107 07/12/2021   LDLDIRECT 104.0 03/17/2013   LDLCALC 112 (H) 07/12/2021   ALT 14 05/18/2022   AST 22 05/18/2022   NA 138 05/18/2022   K 4.1 05/18/2022   CL 102 05/18/2022   CREATININE 1.20 05/18/2022   BUN 18 05/18/2022   CO2 32 05/18/2022   TSH 1.43 03/28/2022   INR 1.04 06/12/2016   HGBA1C 5.0 12/27/2021    CT ABDOMEN PELVIS W CONTRAST  Result Date: 05/24/2021 CLINICAL DATA:  Chronic constipation and nausea. EXAM: CT ABDOMEN AND PELVIS WITH CONTRAST TECHNIQUE: Multidetector CT imaging of the abdomen and pelvis was performed using the standard protocol following bolus administration of intravenous contrast. CONTRAST:  19m ISOVUE-300 IOPAMIDOL (ISOVUE-300) INJECTION 61% COMPARISON:  September 28, 2019 FINDINGS: Lower chest: No acute abnormality. Hepatobiliary: No suspicious hepatic lesion. Gallbladder is unremarkable. No biliary ductal dilation. Pancreas: No pancreatic ductal dilation or evidence of acute inflammation. Spleen: Normal in size without focal abnormality. Adrenals/Urinary Tract: Bilateral adrenal glands are unremarkable. No hydronephrosis. No solid enhancing renal mass. Urinary bladder is unremarkable for degree of distension. Stomach/Bowel: Radiopaque enteric contrast traverses the rectum. Small hiatal hernia  otherwise the stomach is unremarkable for degree of distension. The appendix is not confidently identified however there is no pericecal inflammation. No pathologic dilation of small or large bowel. Terminal ileum appears normal. Moderate stool burden, suggestive of constipation. No evidence of acute bowel inflammation. Vascular/Lymphatic: Aortic and branch vessel atherosclerosis without abdominal aortic aneurysm. No pathologically enlarged abdominal or pelvic lymph nodes. Reproductive: Status post hysterectomy. No adnexal masses. Other: No abdominopelvic ascites. Musculoskeletal: Partially visualized right femoral fixation hardware. Diffuse demineralization of bone. Thoracolumbar spondylosis. No acute  osseous abnormality. IMPRESSION: 1. Moderate stool burden, suggestive of constipation. 2. Small hiatal hernia. 3. Aortic Atherosclerosis (ICD10-I70.0). Electronically Signed   By: Dahlia Bailiff M.D.   On: 05/24/2021 10:57    Assessment & Plan:   Problem List Items Addressed This Visit     Weight loss - Primary    Wt Readings from Last 3 Encounters:  06/28/22 94 lb (42.6 kg)  06/12/22 95 lb 3.2 oz (43.2 kg)  05/25/22 96 lb 9.6 oz (43.8 kg)        Vitamin D deficiency    Chronic  On Vit D Core strengthening exercises      Renal insufficiency    Hydrate well, take fish oil      Osteoporosis    On Prolia       Low back pain    Blue-Emu cream was recommended to use 2-3 times a day       Coronary atherosclerosis    ASA, krill oil Dr Harrington Challenger - Crestor d/c 10/21 Try Rosuvastatin 1/2 tablet every other day or 2-3 times a week due to aches, constipation Consider Repatha if issues      Anxiety    Doing fair         No orders of the defined types were placed in this encounter.     Follow-up: Return in about 3 months (around 09/27/2022) for a follow-up visit.  [name], MD

## 2022-06-28 NOTE — Assessment & Plan Note (Signed)
Wt Readings from Last 3 Encounters:  06/28/22 94 lb (42.6 kg)  06/12/22 95 lb 3.2 oz (43.2 kg)  05/25/22 96 lb 9.6 oz (43.8 kg)

## 2022-06-28 NOTE — Assessment & Plan Note (Signed)
Chronic  On Vit D Core strengthening exercises

## 2022-06-28 NOTE — Assessment & Plan Note (Signed)
ASA, krill oil Dr Harrington Challenger - Crestor d/c 10/21 Try Rosuvastatin 1/2 tablet every other day or 2-3 times a week due to aches, constipation Consider Repatha if issues

## 2022-06-28 NOTE — Assessment & Plan Note (Signed)
Hydrate well, take fish oil

## 2022-06-28 NOTE — Assessment & Plan Note (Signed)
On Prolia 

## 2022-07-16 NOTE — Telephone Encounter (Signed)
I spoke with the pt to follow up with her to see how she is doing as I promised her back in Dec when her Florinef was changed... she says that she has been feeling much better and her dizziness has much improved.. she did not have any BP readings for me but says she periodically checks it and it has been better... she will try to keep a log for Dr Harrington Challenger to review.   She reports some mild peripheral ankle edema bilaterally.. mostly at the end of the day... she has not been wearing her compression stockings but she will start during the day and will elevate when sitting and will watch her NA intake... she has not been having any SOB... she will let us know if no improvement or worsening edema.

## 2022-08-06 ENCOUNTER — Ambulatory Visit (INDEPENDENT_AMBULATORY_CARE_PROVIDER_SITE_OTHER): Payer: Medicare Other | Admitting: Neurology

## 2022-08-06 ENCOUNTER — Ambulatory Visit: Payer: Medicare Other | Admitting: Neurology

## 2022-08-06 ENCOUNTER — Encounter: Payer: Self-pay | Admitting: Neurology

## 2022-08-06 VITALS — BP 132/78 | HR 89 | Ht 62.0 in | Wt 97.0 lb

## 2022-08-06 DIAGNOSIS — R29898 Other symptoms and signs involving the musculoskeletal system: Secondary | ICD-10-CM | POA: Diagnosis not present

## 2022-08-06 NOTE — Progress Notes (Signed)
[facility] Clinic Note - Initial Visit   Date: 08/06/2022   Cindy Meza MRN: [id number] DOB: Apr 23, 1936   Dear Dr. Alain Marion:  Thank you for your kind referral of Cindy Meza for consultation of leg weakness. Although her history is well known to you, please allow [place] to reiterate it for the purpose of our medical record. The patient was accompanied to the clinic by self.    Cindy Meza is a 87 y.o. right-handed female with  asthma, GERD, osteoarthritis, osteoporosis, and orthostatic hypotension presenting for evaluation of bilateral leg weakness.   IMPRESSION/PLAN: Spells of bilateral weakness (subjective).  Patient describes spells of weakness when she goes from seated position to standing position which is very transient and self-resolves. She does not have these symptoms when seated or at rest.  I think her sense of leg weakness and unsteadiness is more related to her orthostatic symptoms, than primary neuromuscular.  Her neurological exam is remarkably normal and I do not see findings to suggest CNS pathology or lumbosacral radiculopathy, myopathy, or neuropathy.  She may have some low back discomfort from lumbosacral degenerative changes and I offered out-patient PT, which was declined.  She prefers to monitor symptoms.  Return to clinic as needed  ------------------------------------------------------------- History of present illness: For the past several months, she reports having spells of feeling weak in the hips, which only occurs when she is moving from a seated position to standing position.  It lasts a few minutes and then resolves.  She also has some unsteadiness and lightheadedness during this time.  None of her symptoms are lasting.  She walks unassisted and has not had any falls.  She is highly independent and lives alone.  She has some low back pain with prolonged walking. No numbness/tingling.   Out-side paper  records, electronic medical record, and images have been reviewed where available and summarized as:  Lab Results  Component Value Date   HGBA1C 5.0 12/27/2021   Lab Results  Component Value Date   VITAMINB12 286 10/26/2019   Lab Results  Component Value Date   TSH 1.43 03/28/2022   Lab Results  Component Value Date   ESRSEDRATE 37 (H) 02/15/2016    Past Medical History:  Diagnosis Date   Allergy    Anemia    Asthma    Dr. Melvyn Novas   Blood transfusion without reported diagnosis    Breast cyst    Left   Cataract    Dr. Gershon Crane   Complication of anesthesia    ' I HAD [facility] DID MY HIP "   GERD (gastroesophageal reflux disease)    Heart murmur    Hemorrhoids    History of colonic polyps Gessner   hx of adenomas (small) 1998   OA (osteoarthritis)    Orthostatic hypotension    Osteopenia    PAC (premature atrial contraction)    Vitamin D deficiency     Past Surgical History:  Procedure Laterality Date   ABDOMINAL HYSTERECTOMY     complete   APPENDECTOMY     BREAST CYST EXCISION     CATARACT EXTRACTION, BILATERAL     04/23/18 left eye, 04/02/18 right eye   COLONOSCOPY  Multiple   Adenomatous colon polyps   ESOPHAGOGASTRODUODENOSCOPY  2010   INTRAMEDULLARY (IM) NAIL INTERTROCHANTERIC Right 06/13/2016   Procedure: INTRAMEDULLARY (IM) NAIL INTERTROCHANTRIC;  Surgeon: Tania Ade, MD;  Location: [facility];  Service: Orthopedics;  Laterality: Right;   TONSILLECTOMY  Medications:  Outpatient Encounter Medications as of 08/06/2022  Medication Sig   aspirin 81 MG tablet Take 81 mg by mouth daily.   Calcium-Magnesium-Vitamin D (CALCIUM 500 PO) Take 1 tablet by mouth 2 (two) times daily with a meal. With lunch and supper   cholecalciferol (VITAMIN D3) 25 MCG (1000 UNIT) tablet Take 1,000 Units by mouth daily.   CVS OMEGA-3 KRILL OIL 500 MG CAPS Take 500 capsules by mouth daily.   Cyanocobalamin (VITAMIN B-12) 1000 MCG SUBL Place 1 tablet (1,000 mcg  total) under the tongue in the morning.   denosumab (PROLIA) 60 MG/ML SOSY injection Inject 60 mg into the skin every 6 (six) months.   estradiol (ESTRACE) 0.1 MG/GM vaginal cream Place vaginally.   fludrocortisone (FLORINEF) 0.1 MG tablet Take 0.5 tablets (0.05 mg total) by mouth 2 (two) times daily. At 8 am and 2 pm   fluticasone-salmeterol (ADVAIR HFA) 115-21 MCG/ACT inhaler Inhale 1-2 puffs into the lungs 2 (two) times daily.   loratadine (CLARITIN) 10 MG tablet Take 10 mg by mouth daily.  prn   pantoprazole (PROTONIX) 40 MG tablet Take 1 tablet (40 mg total) by mouth daily.   polyethylene glycol (MIRALAX / GLYCOLAX) 17 g packet Take 17 g by mouth daily.   Wheat Dextrin (BENEFIBER DRINK MIX PO) Take 1 Capful by mouth daily.   No facility-administered encounter medications on file as of 08/06/2022.    Allergies:  Allergies  Allergen Reactions   Fosamax [Alendronate Sodium]     Paresthesias, abd pain   Gabapentin     dizziness   Lidocaine Swelling    Of face and lips.  Novocaine does same thing.   Midodrine     Nausea    Risedronate Sodium     Knee pain   Shellfish Allergy Hives   Statins     REACTION: pains    Family History: Family History  Problem Relation Age of Onset   Diabetes Mother 42       Deceased   Hypertension Other    Colon cancer Neg Hx    Pancreatic cancer Neg Hx    Stomach cancer Neg Hx    Esophageal cancer Neg Hx    Rectal cancer Neg Hx     Social History: Social History   Tobacco Use   Smoking status: Never   Smokeless tobacco: Never  Vaping Use   Vaping Use: Never used  Substance Use Topics   Alcohol use: Not Currently    Alcohol/week: 0.0 standard drinks of alcohol   Drug use: No   Social History   Social History Narrative   She lives alone in a [place] home.  No children.   Retired Education officer, museum.   Highest level of education:  Masters degree      Right Handed    Lives in one story home / lives alone     Vital Signs:  BP  132/78   Pulse 89   Ht '5\' 2"'$  (1.575 m)   Wt 97 lb (44 kg)   SpO2 98%   BMI 17.74 kg/m    Neurological Exam: MENTAL STATUS including orientation to time, place, person, recent and remote memory, attention span and concentration, language, and fund of knowledge is normal.  Speech is not dysarthric.  CRANIAL NERVES: II:  No visual field defects.     III-IV-VI: Pupils equal round and reactive to light.  Normal conjugate, extra-ocular eye movements in all directions of gaze.  No nystagmus.  No ptosis.  V:  Normal facial sensation.    VII:  Normal facial symmetry and movements.   VIII:  Normal hearing and vestibular function.   IX-X:  Normal palatal movement.   XI:  Normal shoulder shrug and head rotation.   XII:  Normal tongue strength and range of motion, no deviation or fasciculation.  MOTOR:  Generalized loss of muscle bulk.  No atrophy, fasciculations or abnormal movements.  No pronator drift.   Upper Extremity:  Right  Left  Deltoid  5/5   5/5   Biceps  5/5   5/5   Triceps  5/5   5/5   Wrist extensors  5/5   5/5   Wrist flexors  5/5   5/5   Finger extensors  5/5   5/5   Finger flexors  5/5   5/5   Dorsal interossei  5/5   5/5   Abductor pollicis  5/5   5/5   Tone (Ashworth scale)  0  0   Lower Extremity:  Right  Left  Hip flexors  5/5   5/5   Knee flexors  5/5   5/5   Knee extensors  5/5   5/5   Dorsiflexors  5/5   5/5   Plantarflexors  5/5   5/5   Toe extensors  5/5   5/5   Toe flexors  5/5   5/5   Tone (Ashworth scale)  0  0   MSRs:                                           Right        Left brachioradialis 2+  2+  biceps 2+  2+  triceps 2+  2+  patellar 2+  2+  ankle jerk 2+  2+  Hoffman no  no  plantar response down  down   SENSORY:  Normal and symmetric perception of light touch, vibration, and temperature.  COORDINATION/GAIT: Normal finger-to- nose-finger.  Intact rapid alternating movements bilaterally.  Gait narrow based and stable.   Thank you for  allowing me to participate in patient's care.  If I can answer any additional questions, I would be pleased to do so.    Sincerely,    Lillyrose Reitan K. Posey Pronto, DO

## 2022-08-08 ENCOUNTER — Encounter: Payer: Self-pay | Admitting: Internal Medicine

## 2022-08-08 ENCOUNTER — Ambulatory Visit (INDEPENDENT_AMBULATORY_CARE_PROVIDER_SITE_OTHER): Payer: Medicare Other | Admitting: Internal Medicine

## 2022-08-08 VITALS — BP 124/70 | HR 68 | Ht 62.0 in | Wt 97.0 lb

## 2022-08-08 DIAGNOSIS — M810 Age-related osteoporosis without current pathological fracture: Secondary | ICD-10-CM | POA: Diagnosis not present

## 2022-08-08 NOTE — Progress Notes (Signed)
Name: Cindy Meza  MRN/ DOB: [id number], 1935-09-23    Age/ Sex: 87 y.o., female     PCP: Cassandria Anger, MD   Reason for Endocrinology Evaluation: Osteoporosis     Initial Endocrinology Clinic Visit: 03/10/2020    PATIENT IDENTIFIER: Cindy Meza is a 87 y.o., female with a past medical history of OSTEOPOROSIS, aSTHMA AND cad. She has followed with [facility] since 03/10/2020 for consultative assistance with management of her osteoporosis.   HISTORICAL SUMMARY:   Pt was diagnosed with osteoporosis: 2018   Menarche at age : 89 Menopausal at age : in her 61's  Fracture Hx: Right hip fracture  Hx of HRT: yes  FH of osteoporosis or hip fracture: no Prior Hx of anti-estrogenic therapy : no  Prior Hx of anti-resorptive therapy : Intolerant to Fosamax - heel pain, many years ago. Restarted ~ 2020 but stopped in 2021 due to abdominal pain.  She has been on risedronate with knee pain  First Prolia injection 05/05/2020   SUBJECTIVE:    Today (08/08/2022):  Cindy Meza is here for osteoporosis .  Last Prolia injection 05/2022 She was seen by her PCP 06/2022 for weight loss She was seen by neurology 08/06/2022 for bilateral weakness but she is concerned about medication side effects   She continues to have back pain  Denies any recent falls but has a subjective feeling of " blacking out" while going down the stairs but has not fallen Denies allergic reaction to the Prolia     Calcium 1 tabs BID Vitamin D3 1000 iu daily  Prolia 60 mg Gary Q 6 months       HISTORY:  Past Medical History:  Past Medical History:  Diagnosis Date   Allergy    Anemia    Asthma    Dr. Melvyn Novas   Blood transfusion without reported diagnosis    Breast cyst    Left   Cataract    Dr. Gershon Crane   Complication of anesthesia    ' I HAD HALLUCINATIONS WHEN THEY DID MY HIP "   GERD (gastroesophageal reflux disease)    Heart murmur    Hemorrhoids    History of  colonic polyps Gessner   hx of adenomas (small) 1998   OA (osteoarthritis)    Orthostatic hypotension    Osteopenia    PAC (premature atrial contraction)    Vitamin D deficiency    Past Surgical History:  Past Surgical History:  Procedure Laterality Date   ABDOMINAL HYSTERECTOMY     complete   APPENDECTOMY     BREAST CYST EXCISION     CATARACT EXTRACTION, BILATERAL     04/23/18 left eye, 04/02/18 right eye   COLONOSCOPY  Multiple   Adenomatous colon polyps   ESOPHAGOGASTRODUODENOSCOPY  2010   INTRAMEDULLARY (IM) NAIL INTERTROCHANTERIC Right 06/13/2016   Procedure: INTRAMEDULLARY (IM) NAIL INTERTROCHANTRIC;  Surgeon: Tania Ade, MD;  Location: [facility];  Service: Orthopedics;  Laterality: Right;   TONSILLECTOMY     Social History:  reports that she has never smoked. She has never used smokeless tobacco. She reports that she does not currently use alcohol. She reports that she does not use drugs. Family History:  Family History  Problem Relation Age of Onset   Diabetes Mother 8       Deceased   Hypertension Other    Colon cancer Neg Hx    Pancreatic cancer Neg Hx    Stomach cancer Neg Hx  Esophageal cancer Neg Hx    Rectal cancer Neg Hx      HOME MEDICATIONS: Allergies as of 08/08/2022       Reactions   Fosamax [alendronate Sodium]    Paresthesias, abd pain   Gabapentin    dizziness   Lidocaine Swelling   Of face and lips.  Novocaine does same thing.   Midodrine    Nausea   Risedronate Sodium    Knee pain   Shellfish Allergy Hives   Statins    REACTION: pains        Medication List        Accurate as of August 08, 2022  2:27 PM. If you have any questions, ask your nurse or doctor.          aspirin 81 MG tablet Take 81 mg by mouth daily.   BENEFIBER DRINK MIX PO Take 1 Capful by mouth daily.   CALCIUM 500 PO Take 1 tablet by mouth 2 (two) times daily with a meal. With lunch and supper   cholecalciferol 25 MCG (1000 UNIT) tablet Commonly  known as: VITAMIN D3 Take 1,000 Units by mouth daily.   CVS Omega-3 Krill Oil 500 MG Caps Take 500 capsules by mouth daily.   denosumab 60 MG/ML Sosy injection Commonly known as: PROLIA Inject 60 mg into the skin every 6 (six) months.   estradiol 0.1 MG/GM vaginal cream Commonly known as: ESTRACE Place vaginally.   fludrocortisone 0.1 MG tablet Commonly known as: FLORINEF Take 0.5 tablets (0.05 mg total) by mouth 2 (two) times daily. At 8 am and 2 pm   fluticasone-salmeterol 115-21 MCG/ACT inhaler Commonly known as: Advair HFA Inhale 1-2 puffs into the lungs 2 (two) times daily.   loratadine 10 MG tablet Commonly known as: CLARITIN Take 10 mg by mouth daily.  prn   pantoprazole 40 MG tablet Commonly known as: PROTONIX Take 1 tablet (40 mg total) by mouth daily.   polyethylene glycol 17 g packet Commonly known as: MIRALAX / GLYCOLAX Take 17 g by mouth daily.   Vitamin B-12 1000 MCG Subl Place 1 tablet (1,000 mcg total) under the tongue in the morning.          OBJECTIVE:   PHYSICAL EXAM: VS: BP 124/70 (BP Location: Left Arm, Patient Position: Sitting, Cuff Size: Small)   Pulse 68   Ht '5\' 2"'$  (1.575 m)   Wt 97 lb (44 kg)   SpO2 98%   BMI 17.74 kg/m    EXAM: General: Pt appears well and is in NAD  Lungs: Clear with good BS bilat   Heart: Auscultation: RRR.  Extremities:  BL LE: No pretibial edema normal ROM and strength.  Mental Status: Judgment, insight: Intact Orientation: Oriented to time, place, and person Mood and affect: No depression, anxiety, or agitation     DATA REVIEWED:   Latest Reference Range & Units 05/18/22 11:48  Sodium 135 - 145 mEq/L 138  Potassium 3.5 - 5.1 mEq/L 4.1  Chloride 96 - 112 mEq/L 102  CO2 19 - 32 mEq/L 32  Glucose 70 - 99 mg/dL 90  BUN 6 - 23 mg/dL 18  Creatinine 0.40 - 1.20 mg/dL 1.20  Calcium 8.4 - 10.5 mg/dL 9.6  Alkaline Phosphatase 39 - 117 U/L 32 (L)  Albumin 3.5 - 5.2 g/dL 4.1  AST 0 - 37 U/L 22  ALT 0  - 35 U/L 14  Total Protein 6.0 - 8.3 g/dL 7.3  Total Bilirubin 0.2 - 1.2 mg/dL 0.4  GFR >60.00 mL/min 41.62 (L)  (L): Data is abnormally low  ASSESSMENT / PLAN / RECOMMENDATIONS:   1. Osteoporosis :  - Started on prolia 04/2020, she  received her last dose of prolia 05/29/2022, will be due 11/2022 - She did not schedule DXA last year, I offered to schedule it for her at [facility], but she opted to call herself that way she can look at her calendar. - Emphasized the importance of optimizing calcium and vitamin D intake.     Medications  Continue calcium 500 mg twice daily Continue Vitamin D 1000 iu daily  Continue Prolia 60 mg SQ every 6 months   Follow-up in 1 year  Signed electronically by: Mack Guise, MD  [facility]  [facility] [address], [place] [address] Phone: [phone] FAX: [phone]      CC: Cassandria Anger, MD [place] [address] Phone: [phone]  Fax: [phone]   Return to Endocrinology clinic as below: Future Appointments  Date Time Provider [place]  09/07/2022  2:30 PM Gatha Mayer, MD [facility]  09/27/2022  2:00 PM [name], Evie Lacks, MD [facility] None  11/20/2022  2:20 PM [name], MD [facility]  11/30/2022  2:30 PM [facility] NURSE [facility] None

## 2022-08-08 NOTE — Patient Instructions (Addendum)
-  Continue Calcium tablets twice daily  - Continue Vitamin D 1000 iu daily     Please schedule Bone density at [facility] : [address] [place], [place], [place] [address] Phone: [phone]

## 2022-08-27 ENCOUNTER — Ambulatory Visit (INDEPENDENT_AMBULATORY_CARE_PROVIDER_SITE_OTHER): Payer: Medicare Other

## 2022-08-27 VITALS — Ht 62.0 in | Wt 97.0 lb

## 2022-08-27 DIAGNOSIS — Z0000 Encounter for general adult medical examination without abnormal findings: Secondary | ICD-10-CM

## 2022-08-27 NOTE — Patient Instructions (Signed)
[name] , Thank you for taking time to come for your Medicare Wellness Visit. I appreciate your ongoing commitment to your health goals. Please review the following plan we discussed and let me know if I can assist you in the future.   These are the goals we discussed:  Goals      Patient Stated     Would like to try gain weight         This is a list of the screening recommended for you and due dates:  Health Maintenance  Topic Date Due   DTaP/Tdap/Td vaccine (2 - Tdap) 06/09/2017   COVID-19 Vaccine (3 - Moderna risk series) 06/09/2020   Medicare Annual Wellness Visit  08/28/2023   Pneumonia Vaccine  Completed   Flu Shot  Completed   Zoster (Shingles) Vaccine  Completed   HPV Vaccine  Aged Out    Advanced directives: Yes  Conditions/risks identified: Yes  Next appointment: Follow up in one year for your annual wellness visit.   Preventive Care 36 Years and Older, Female Preventive care refers to lifestyle choices and visits with your health care provider that can promote health and wellness. What does preventive care include? A yearly physical exam. This is also called an annual well check. Dental exams once or twice a year. Routine eye exams. Ask your health care provider how often you should have your eyes checked. Personal lifestyle choices, including: Daily care of your teeth and gums. Regular physical activity. Eating a healthy diet. Avoiding tobacco and drug use. Limiting alcohol use. Practicing safe sex. Taking low-dose aspirin every day. Taking vitamin and mineral supplements as recommended by your health care provider. What happens during an annual well check? The services and screenings done by your health care provider during your annual well check will depend on your age, overall health, lifestyle risk factors, and family history of disease. Counseling  Your health care provider may ask you questions about your: Alcohol use. Tobacco use. Drug  use. Emotional well-being. Home and relationship well-being. Sexual activity. Eating habits. History of falls. Memory and ability to understand (cognition). Work and work Statistician. Reproductive health. Screening  You may have the following tests or measurements: Height, weight, and BMI. Blood pressure. Lipid and cholesterol levels. These may be checked every 5 years, or more frequently if you are over 66 years old. Skin check. Lung cancer screening. You may have this screening every year starting at age 63 if you have a 30-pack-year history of smoking and currently smoke or have quit within the past 15 years. Fecal occult blood test (FOBT) of the stool. You may have this test every year starting at age 56. Flexible sigmoidoscopy or colonoscopy. You may have a sigmoidoscopy every 5 years or a colonoscopy every 10 years starting at age 25. Hepatitis C blood test. Hepatitis B blood test. Sexually transmitted disease (STD) testing. Diabetes screening. This is done by checking your blood sugar (glucose) after you have not eaten for a while (fasting). You may have this done every 1-3 years. Bone density scan. This is done to screen for osteoporosis. You may have this done starting at age 45. Mammogram. This may be done every 1-2 years. Talk to your health care provider about how often you should have regular mammograms. Talk with your health care provider about your test results, treatment options, and if necessary, the need for more tests. Vaccines  Your health care provider may recommend certain vaccines, such as: Influenza vaccine. This is recommended  every year. Tetanus, diphtheria, and acellular pertussis (Tdap, Td) vaccine. You may need a Td booster every 10 years. Zoster vaccine. You may need this after age 69. Pneumococcal 13-valent conjugate (PCV13) vaccine. One dose is recommended after age 72. Pneumococcal polysaccharide (PPSV23) vaccine. One dose is recommended after age  24. Talk to your health care provider about which screenings and vaccines you need and how often you need them. This information is not intended to replace advice given to you by your health care provider. Make sure you discuss any questions you have with your health care provider. Document Released: 07/22/2015 Document Revised: 03/14/2016 Document Reviewed: 04/26/2015 Elsevier Interactive Patient Education  2017 [place] Prevention in the Home Falls can cause injuries. They can happen to people of all ages. There are many things you can do to make your home safe and to help prevent falls. What can I do on the outside of my home? Regularly fix the edges of walkways and driveways and fix any cracks. Remove anything that might make you trip as you walk through a door, such as a raised step or threshold. Trim any bushes or trees on the path to your home. Use bright outdoor lighting. Clear any walking paths of anything that might make someone trip, such as rocks or tools. Regularly check to see if handrails are loose or broken. Make sure that both sides of any steps have handrails. Any raised decks and porches should have guardrails on the edges. Have any leaves, snow, or ice cleared regularly. Use sand or salt on walking paths during winter. Clean up any spills in your garage right away. This includes oil or grease spills. What can I do in the bathroom? Use night lights. Install grab bars by the toilet and in the tub and shower. Do not use towel bars as grab bars. Use non-skid mats or decals in the tub or shower. If you need to sit down in the shower, use a plastic, non-slip stool. Keep the floor dry. Clean up any water that spills on the floor as soon as it happens. Remove soap buildup in the tub or shower regularly. Attach bath mats securely with double-sided non-slip rug tape. Do not have throw rugs and other things on the floor that can make you trip. What can I do in the  bedroom? Use night lights. Make sure that you have a light by your bed that is easy to reach. Do not use any sheets or blankets that are too big for your bed. They should not hang down onto the floor. Have a firm chair that has side arms. You can use this for support while you get dressed. Do not have throw rugs and other things on the floor that can make you trip. What can I do in the kitchen? Clean up any spills right away. Avoid walking on wet floors. Keep items that you use a lot in easy-to-reach places. If you need to reach something above you, use a strong step stool that has a grab bar. Keep electrical cords out of the way. Do not use floor polish or wax that makes floors slippery. If you must use wax, use non-skid floor wax. Do not have throw rugs and other things on the floor that can make you trip. What can I do with my stairs? Do not leave any items on the stairs. Make sure that there are handrails on both sides of the stairs and use them. Fix handrails that are broken  or loose. Make sure that handrails are as long as the stairways. Check any carpeting to make sure that it is firmly attached to the stairs. Fix any carpet that is loose or worn. Avoid having throw rugs at the top or bottom of the stairs. If you do have throw rugs, attach them to the floor with carpet tape. Make sure that you have a light switch at the top of the stairs and the bottom of the stairs. If you do not have them, ask someone to add them for you. What else can I do to help prevent falls? Wear shoes that: Do not have high heels. Have rubber bottoms. Are comfortable and fit you well. Are closed at the toe. Do not wear sandals. If you use a stepladder: Make sure that it is fully opened. Do not climb a closed stepladder. Make sure that both sides of the stepladder are locked into place. Ask someone to hold it for you, if possible. Clearly mark and make sure that you can see: Any grab bars or  handrails. First and last steps. Where the edge of each step is. Use tools that help you move around (mobility aids) if they are needed. These include: Canes. Walkers. Scooters. Crutches. Turn on the lights when you go into a dark area. Replace any light bulbs as soon as they burn out. Set up your furniture so you have a clear path. Avoid moving your furniture around. If any of your floors are uneven, fix them. If there are any pets around you, be aware of where they are. Review your medicines with your doctor. Some medicines can make you feel dizzy. This can increase your chance of falling. Ask your doctor what other things that you can do to help prevent falls. This information is not intended to replace advice given to you by your health care provider. Make sure you discuss any questions you have with your health care provider. Document Released: 04/21/2009 Document Revised: 12/01/2015 Document Reviewed: 07/30/2014 Elsevier Interactive Patient Education  2017 [employer].

## 2022-08-27 NOTE — Progress Notes (Addendum)
I connected with Cindy Meza today by telephone and verified that I am speaking with the correct person using two identifiers. Location patient: home Location provider: work Persons participating in the virtual visit: patient, provider.   I discussed the limitations, risks, security and privacy concerns of performing an evaluation and management service by telephone and the availability of in person appointments. I also discussed with the patient that there may be a patient responsible charge related to this service. The patient expressed understanding and verbally consented to this telephonic visit.    Interactive audio and video telecommunications were attempted between this provider and patient, however failed, due to patient having technical difficulties OR patient did not have access to video capability.  We continued and completed visit with audio only.  Some vital signs may be absent or patient reported.   Time Spent with patient on telephone encounter: 40 minutes  Subjective:   Cindy Meza is a 87 y.o. female who presents for Medicare Annual (Subsequent) preventive examination.  Review of Systems     Cardiac Risk Factors include: advanced age (>60mn, >>50women)     Objective:    Today's Vitals   08/27/22 1604  Weight: 97 lb (44 kg)  Height: '5\' 2"'$  (1.575 m)  PainSc: 0-No pain   Body mass index is 17.74 kg/m.     08/27/2022    4:08 PM 08/06/2022    1:04 PM 08/25/2021    9:01 AM 08/16/2020    2:56 PM 08/08/2020   11:24 AM 04/24/2018    5:22 PM 06/14/2016    3:46 PM  Advanced Directives  Does Patient Have a Medical Advance Directive? Yes Yes Yes Yes Yes Yes Yes  Type of AParamedicof [place]Living will [place]Living will;Out of facility DNR (pink MOST or yellow form) HBinfordLiving will HMecostaLiving will HFultonLiving will HLake WaccamawLiving will [place] Does patient want to make changes to medical advance directive?   Yes (MAU/Ambulatory/Procedural Areas - Information given) No - Patient declined No - Patient declined  No - Patient declined  Copy of HSierra Vista Southeastin Chart? No - copy requested   No - copy requested No - copy requested  No - copy requested    Current Medications (verified) Outpatient Encounter Medications as of 08/27/2022  Medication Sig   aspirin 81 MG tablet Take 81 mg by mouth daily.   Calcium-Magnesium-Vitamin D (CALCIUM 500 PO) Take 1 tablet by mouth 2 (two) times daily with a meal. With lunch and supper   cholecalciferol (VITAMIN D3) 25 MCG (1000 UNIT) tablet Take 1,000 Units by mouth daily.   [employer] OMEGA-3 KRILL OIL 500 MG CAPS Take 500 capsules by mouth daily.   Cyanocobalamin (VITAMIN B-12) 1000 MCG SUBL Place 1 tablet (1,000 mcg total) under the tongue in the morning.   denosumab (PROLIA) 60 MG/ML SOSY injection Inject 60 mg into the skin every 6 (six) months.   estradiol (ESTRACE) 0.1 MG/GM vaginal cream Place vaginally. (Patient not taking: Reported on 08/27/2022)   fludrocortisone (FLORINEF) 0.1 MG tablet Take 0.5 tablets (0.05 mg total) by mouth 2 (two) times daily. At 8 am and 2 pm   fluticasone-salmeterol (ADVAIR HFA) 115-21 MCG/ACT inhaler Inhale 1-2 puffs into the lungs 2 (two) times daily.   loratadine (CLARITIN) 10 MG tablet Take 10 mg by mouth daily.  prn   pantoprazole (PROTONIX) 40 MG tablet Take  1 tablet (40 mg total) by mouth daily.   polyethylene glycol (MIRALAX / GLYCOLAX) 17 g packet Take 17 g by mouth daily.   Wheat Dextrin (BENEFIBER DRINK MIX PO) Take 1 Capful by mouth daily.   No facility-administered encounter medications on file as of 08/27/2022.    Allergies (verified) Fosamax [alendronate sodium], Gabapentin, Lidocaine, Midodrine, Risedronate sodium, Shellfish allergy, and Statins   History: Past Medical History:  Diagnosis  Date   Allergy    Anemia    Asthma    Dr. Melvyn Novas   Blood transfusion without reported diagnosis    Breast cyst    Left   Cataract    Dr. Gershon Crane   Complication of anesthesia    ' I HAD HALLUCINATIONS WHEN THEY DID MY HIP "   GERD (gastroesophageal reflux disease)    Heart murmur    Hemorrhoids    History of colonic polyps Gessner   hx of adenomas (small) 1998   OA (osteoarthritis)    Orthostatic hypotension    Osteopenia    PAC (premature atrial contraction)    Vitamin D deficiency    Past Surgical History:  Procedure Laterality Date   ABDOMINAL HYSTERECTOMY     complete   APPENDECTOMY     BREAST CYST EXCISION     CATARACT EXTRACTION, BILATERAL     04/23/18 left eye, 04/02/18 right eye   COLONOSCOPY  Multiple   Adenomatous colon polyps   ESOPHAGOGASTRODUODENOSCOPY  2010   INTRAMEDULLARY (IM) NAIL INTERTROCHANTERIC Right 06/13/2016   Procedure: INTRAMEDULLARY (IM) NAIL INTERTROCHANTRIC;  Surgeon: Tania Ade, MD;  Location: [facility];  Service: Orthopedics;  Laterality: Right;   TONSILLECTOMY     Family History  Problem Relation Age of Onset   Diabetes Mother 34       Deceased   Hypertension Other    Colon cancer Neg Hx    Pancreatic cancer Neg Hx    Stomach cancer Neg Hx    Esophageal cancer Neg Hx    Rectal cancer Neg Hx    Social History   Socioeconomic History   Marital status: Widowed    Spouse name: Not on file   Number of children: Not on file   Years of education: Not on file   Highest education level: Not on file  Occupational History   Occupation: retired  Tobacco Use   Smoking status: Never   Smokeless tobacco: Never  Vaping Use   Vaping Use: Never used  Substance and Sexual Activity   Alcohol use: Not Currently    Alcohol/week: 0.0 standard drinks of alcohol   Drug use: No   Sexual activity: Not Currently  Other Topics Concern   Not on file  Social History Narrative   She lives alone in a [place] home.  No children.   Retired Research officer, political party.   Highest level of education:  Masters degree      Right Handed    Lives in one story home / lives alone    Social Determinants of Health   Financial Resource Strain: Low Risk  (08/27/2022)   Overall Financial Resource Strain (CARDIA)    Difficulty of Paying Living Expenses: Not hard at all  Food Insecurity: No Food Insecurity (08/27/2022)   Hunger Vital Sign    Worried About Running Out of Food in the Last Year: Never true    Ran Out of Food in the Last Year: Never true  Transportation Needs: No Transportation Needs (08/27/2022)   PRAPARE - Transportation  Lack of Transportation (Medical): No    Lack of Transportation (Non-Medical): No  Physical Activity: Inactive (08/27/2022)   Exercise Vital Sign    Days of Exercise per Week: 0 days    Minutes of Exercise per Session: 0 min  Stress: No Stress Concern Present (08/27/2022)   [place]    Feeling of Stress : Not at all  Social Connections: Moderately Isolated (08/27/2022)   Social Connection and Isolation Panel [NHANES]    Frequency of Communication with Friends and Family: More than three times a week    Frequency of Social Gatherings with Friends and Family: Three times a week    Attends Religious Services: More than 4 times per year    Active Member of Clubs or Organizations: No    Attends Archivist Meetings: Never    Marital Status: Widowed    Tobacco Counseling Counseling given: Not Answered   Clinical Intake:  Pre-visit preparation completed: Yes  Pain : No/denies pain Pain Score: 0-No pain     BMI - recorded: 17.74 Nutritional Status: BMI <19  Underweight Nutritional Risks: None Diabetes: No  How often do you need to have someone help you when you read instructions, pamphlets, or other written materials from your doctor or pharmacy?: 1 - Never What is the last grade level you completed in school?: Graduate Degree; 18  years  Diabetic? no  Interpreter Needed?: No  Information entered by :: Lisette Abu, LPN.   Activities of Daily Living    08/27/2022    4:13 PM  In your present state of health, do you have any difficulty performing the following activities:  Hearing? 0  Vision? 0  Difficulty concentrating or making decisions? 0  Walking or climbing stairs? 0  Dressing or bathing? 0  Doing errands, shopping? 0  Preparing Food and eating ? N  Using the Toilet? N  In the past six months, have you accidently leaked urine? N  Do you have problems with loss of bowel control? N  Managing your Medications? N  Managing your Finances? N  Housekeeping or managing your Housekeeping? N    Patient Care Team: [name], Evie Lacks, MD as PCP - General [name], MD as PCP - Cardiology (Cardiology) Aloha Gell, MD (Obstetrics and Gynecology) Gatha Mayer, MD as Consulting Physician (Gastroenterology) [facility], Melanie Crazier, MD as Consulting Physician (Endocrinology) Rutherford Guys, MD as Consulting Physician (Ophthalmology) Alda Berthold, DO as Consulting Physician (Neurology)  Indicate any recent Medical Services you may have received from other than [facility] providers in the past year (date may be approximate).     Assessment:   This is a routine wellness examination for [place].  Hearing/Vision screen Hearing Screening - Comments:: Denies hearing difficulties   Vision Screening - Comments:: Wears rx glasses - up to date with routine eye exams with Dr. Rutherford Guys   Dietary issues and exercise activities discussed: Current Exercise Habits: The patient does not participate in regular exercise at present   Goals Addressed   None   Depression Screen    08/27/2022    4:14 PM 06/28/2022    2:51 PM 05/25/2022   10:35 AM 09/27/2021    2:28 PM 09/05/2021   10:40 AM 08/25/2021    9:05 AM 08/08/2020   11:55 AM  PHQ 2/9 Scores  PHQ - 2 Score 0 0 0 0 0 0 0  PHQ- 9 Score 0 0  Fall Risk    08/27/2022    4:10 PM 08/06/2022    1:04 PM 06/28/2022    2:51 PM 05/25/2022   10:35 AM 05/18/2022   10:34 AM  Fall Risk   Falls in the past year? 1 1 0 0 0  Number falls in past yr: 0 0 0 0 0  Injury with Fall? 0 0 0 0 0  Risk for fall due to : No Fall Risks  No Fall Risks No Fall Risks No Fall Risks  Follow up Falls evaluation completed Falls evaluation completed Falls evaluation completed Falls evaluation completed Falls evaluation completed    [place]:  Any stairs in or around the home? No  If so, are there any without handrails? No  Home free of loose throw rugs in walkways, pet beds, electrical cords, etc? Yes  Adequate lighting in your home to reduce risk of falls? Yes   ASSISTIVE DEVICES UTILIZED TO PREVENT FALLS:  Life alert? No  Use of a cane, walker or w/c? No  Grab bars in the bathroom? No  Shower chair or bench in shower? Yes  Elevated toilet seat or a handicapped toilet? Yes   TIMED UP AND GO:  Was the test performed? No . Telephonic Visit  Cognitive Function:        08/27/2022    4:14 PM  6CIT Screen  What Year? 0 points  What month? 0 points  What time? 0 points  Count back from 20 0 points  Months in reverse 0 points  Repeat phrase 0 points  Total Score 0 points    Immunizations Immunization History  Administered Date(s) Administered   Fluad Quad(high Dose 65+) 04/14/2019, 04/28/2020, 03/08/2021, 03/28/2022   Influenza Split 05/16/2011, 05/07/2012   Influenza Whole 04/29/2008, 04/27/2010   Influenza, High Dose Seasonal PF 04/04/2016, 04/09/2017, 04/10/2018   Influenza,inj,Quad PF,6+ Mos 03/17/2013, 03/23/2014, 03/30/2015   Moderna SARS-COV2 Booster Vaccination 05/12/2020   Moderna Sars-Covid-2 Vaccination 08/02/2019, 08/30/2019   PPD Test 06/15/2016   Pneumococcal Conjugate-13 09/17/2013   Pneumococcal Polysaccharide-23 02/02/2010   Td 06/10/2007   Zoster Recombinat (Shingrix)  08/11/2017, 03/17/2018   Zoster, Live 10/28/2007    TDAP status: Due, Education has been provided regarding the importance of this vaccine. Advised may receive this vaccine at local pharmacy or Health Dept. Aware to provide a copy of the vaccination record if obtained from local pharmacy or Health Dept. Verbalized acceptance and understanding.  Flu Vaccine status: Up to date  Pneumococcal vaccine status: Up to date  Covid-19 vaccine status: Completed vaccines  Qualifies for Shingles Vaccine? Yes   Zostavax completed Yes   Shingrix Completed?: Yes  Screening Tests Health Maintenance  Topic Date Due   DTaP/Tdap/Td (2 - Tdap) 06/09/2017   COVID-19 Vaccine (3 - Moderna risk series) 06/09/2020   Medicare Annual Wellness (AWV)  08/28/2023   Pneumonia Vaccine 68+ Years old  Completed   INFLUENZA VACCINE  Completed   Zoster Vaccines- Shingrix  Completed   HPV VACCINES  Aged Out    Health Maintenance  Health Maintenance Due  Topic Date Due   DTaP/Tdap/Td (2 - Tdap) 06/09/2017   COVID-19 Vaccine (3 - Moderna risk series) 06/09/2020    Colorectal cancer screening: No longer required.   Mammogram status: Completed 05/09/2022. Repeat every year  Bone Density status: Completed 04/09/2019. Results reflect: Bone density results: OSTEOPOROSIS. Repeat every 1 years. (According to report.)  Lung Cancer Screening: (Low Dose CT Chest recommended if Age 54-80  years, 30 pack-year currently smoking OR have quit w/in 15years.) does not qualify.   Lung Cancer Screening Referral: no  Additional Screening:  Hepatitis C Screening: does not qualify; Completed no  Vision Screening: Recommended annual ophthalmology exams for early detection of glaucoma and other disorders of the eye. Is the patient up to date with their annual eye exam?  Yes  Who is the provider or what is the name of the office in which the patient attends annual eye exams? Rutherford Guys, MD. If pt is not established with a  provider, would they like to be referred to a provider to establish care? No .   Dental Screening: Recommended annual dental exams for proper oral hygiene  Community Resource Referral / Chronic Care Management: CRR required this visit?  No   CCM required this visit?  No      Plan:     I have personally reviewed and noted the following in the patient's chart:   Medical and social history Use of alcohol, tobacco or illicit drugs  Current medications and supplements including opioid prescriptions. Patient is not currently taking opioid prescriptions. Functional ability and status Nutritional status Physical activity Advanced directives List of other physicians Hospitalizations, surgeries, and ER visits in previous 12 months Vitals Screenings to include cognitive, depression, and falls Referrals and appointments  In addition, I have reviewed and discussed with patient certain preventive protocols, quality metrics, and best practice recommendations. A written personalized care plan for preventive services as well as general preventive health recommendations were provided to patient.     Sheral Flow, LPN   [id number]   Nurse Notes: none    Medical screening examination/treatment/procedure(s) were performed by non-physician practitioner and as supervising physician I was immediately available for consultation/collaboration.  I agree with above. Lew Dawes, MD

## 2022-08-29 ENCOUNTER — Telehealth: Payer: Self-pay

## 2022-08-29 ENCOUNTER — Encounter: Payer: Self-pay | Admitting: Internal Medicine

## 2022-08-29 NOTE — Telephone Encounter (Signed)
Patient spoke with [facility] and they state that insurance won't pay for Bone Density because it have to be every 2 years. Patient wants to know if she needs to go ahead and schedule it because she is having weakness in thighs and knees . She is having nausea as well.

## 2022-08-29 NOTE — Telephone Encounter (Signed)
Patient is calling in stating she seen Dr.Patel in January and just seen her Rheumatologist. Cindy Meza was recommended to follow up with Dr.Patel as patient has been experiencing worsening weakness in her legs.

## 2022-08-29 NOTE — Telephone Encounter (Signed)
Pt contacted and advised she had a DEXA in 08/2021 and will not need another one until 2025. Pt also advised to follow up with neurology regarding leg weakness. DEXA abstracted.

## 2022-08-30 NOTE — Telephone Encounter (Signed)
I got patient sch for 09-05-22 at 10:10 with Posey Pronto

## 2022-08-30 NOTE — Telephone Encounter (Signed)
Ok to schedule follow-up.

## 2022-09-05 ENCOUNTER — Ambulatory Visit (INDEPENDENT_AMBULATORY_CARE_PROVIDER_SITE_OTHER): Payer: Medicare Other | Admitting: Neurology

## 2022-09-05 ENCOUNTER — Encounter: Payer: Self-pay | Admitting: Neurology

## 2022-09-05 VITALS — BP 125/76 | HR 88 | Ht 62.0 in | Wt 95.0 lb

## 2022-09-05 DIAGNOSIS — R29898 Other symptoms and signs involving the musculoskeletal system: Secondary | ICD-10-CM | POA: Diagnosis not present

## 2022-09-05 DIAGNOSIS — R292 Abnormal reflex: Secondary | ICD-10-CM | POA: Diagnosis not present

## 2022-09-05 NOTE — Patient Instructions (Signed)
MRI lumbar spine without constant

## 2022-09-05 NOTE — Progress Notes (Signed)
Follow-up Visit   Date: 09/05/2022    Cindy Meza MRN: [id number] DOB: 06-08-1936    Cindy Meza is a 87 y.o. right-handed female with asthma, GERD, osteoarthritis, osteoporosis, and orthostatic hypotension returning to the clinic for follow-up of leg weakness.  The patient was accompanied to the clinic by self.   IMPRESSION/PLAN: Spells of bilateral leg weakness with right leg pain.  She may have some spinal canal stenosis given brisk reflexes in the legs which may cause radicular leg pain, however, I would not expect this to cause transient weakness of the legs with standing, which only lasts a few seconds.  She does not have symptoms of neurogenic claudication. The sensation of transient weakness and lightheadedness, I think is related to orthostasis.  To be complete, MRI lumbar spine wo contrast will be ordered.  She completed PT early in 2023 and does not wish to restart this.    Further recommendations pending results.  --------------------------------------------- History of present illness: For the past several months, she reports having spells of feeling weak in the hips, which only occurs when she is moving from a seated position to standing position. It lasts a few minutes and then resolves. She also has some unsteadiness and lightheadedness during this time. None of her symptoms are lasting. She walks unassisted and has not had any falls. She is highly independent and lives alone. She has some low back pain with prolonged walking. No numbness/tingling.   UPDATE 09/05/2022: She is here with the same complains as her last visit.  She reports discomfort in the right hip and posterior leg upon standing.  She feels weakness, unsteady/loss of balance upon standing.  Symptoms resolve within a few seconds - minutes.  She walks unassisted.   Medications:  Current Outpatient Medications on File Prior to Visit  Medication Sig Dispense Refill   aspirin 81 MG tablet  Take 81 mg by mouth daily.     Calcium-Magnesium-Vitamin D (CALCIUM 500 PO) Take 1 tablet by mouth 2 (two) times daily with a meal. With lunch and supper     cholecalciferol (VITAMIN D3) 25 MCG (1000 UNIT) tablet Take 1,000 Units by mouth daily.     CVS OMEGA-3 KRILL OIL 500 MG CAPS Take 500 capsules by mouth daily.     Cyanocobalamin (VITAMIN B-12) 1000 MCG SUBL Place 1 tablet (1,000 mcg total) under the tongue in the morning. 100 tablet 3   denosumab (PROLIA) 60 MG/ML SOSY injection Inject 60 mg into the skin every 6 (six) months.     estradiol (ESTRACE) 0.1 MG/GM vaginal cream Place vaginally.     fludrocortisone (FLORINEF) 0.1 MG tablet Take 0.5 tablets (0.05 mg total) by mouth 2 (two) times daily. At 8 am and 2 pm 180 tablet 3   fluticasone-salmeterol (ADVAIR HFA) 115-21 MCG/ACT inhaler Inhale 1-2 puffs into the lungs 2 (two) times daily. 12 g 11   loratadine (CLARITIN) 10 MG tablet Take 10 mg by mouth daily.  prn     pantoprazole (PROTONIX) 40 MG tablet Take 1 tablet (40 mg total) by mouth daily. 90 tablet 3   polyethylene glycol (MIRALAX / GLYCOLAX) 17 g packet Take 17 g by mouth daily.     Wheat Dextrin (BENEFIBER DRINK MIX PO) Take 1 Capful by mouth daily.     No current facility-administered medications on file prior to visit.    Allergies:  Allergies  Allergen Reactions   Fosamax [Alendronate Sodium]     Paresthesias, abd pain  Gabapentin     dizziness   Lidocaine Swelling    Of face and lips.  Novocaine does same thing.   Midodrine     Nausea    Risedronate Sodium     Knee pain   Shellfish Allergy Hives   Statins     REACTION: pains    Vital Signs:  BP 125/76   Pulse 88   Ht '5\' 2"'$  (1.575 m)   Wt 95 lb (43.1 kg)   SpO2 99%   BMI 17.38 kg/m   Neurological Exam: MENTAL STATUS including orientation to time, place, person, recent and remote memory, attention span and concentration, language, and fund of knowledge is normal.  Speech is not dysarthric.  CRANIAL  NERVES:   Pupils equal round and reactive to light.  Normal conjugate, extra-ocular eye movements in all directions of gaze.  No ptosis .  Face is symmetric.   MOTOR:  Motor strength is 5/5 in all extremities.  No atrophy, fasciculations or abnormal movements.  No pronator drift.  Tone is normal.    MSRs:  Reflexes are 2+/4 throughout, except 3+/4 bilateral Achilles.  SENSORY:  Intact to vibration throughout .  COORDINATION/GAIT:  Normal finger-to- nose-finger.  Intact rapid alternating movements bilaterally.  Gait narrow based and stable.   Data:n/a    Thank you for allowing me to participate in patient's care.  If I can answer any additional questions, I would be pleased to do so.    Sincerely,    Kert Shackett K. Posey Pronto, DO

## 2022-09-07 ENCOUNTER — Encounter: Payer: Self-pay | Admitting: Internal Medicine

## 2022-09-07 ENCOUNTER — Ambulatory Visit (INDEPENDENT_AMBULATORY_CARE_PROVIDER_SITE_OTHER): Payer: Medicare Other | Admitting: Internal Medicine

## 2022-09-07 VITALS — BP 122/76 | HR 76 | Ht 62.0 in | Wt 95.0 lb

## 2022-09-07 DIAGNOSIS — K648 Other hemorrhoids: Secondary | ICD-10-CM

## 2022-09-07 DIAGNOSIS — K5909 Other constipation: Secondary | ICD-10-CM

## 2022-09-07 DIAGNOSIS — R1033 Periumbilical pain: Secondary | ICD-10-CM | POA: Diagnosis not present

## 2022-09-07 NOTE — Progress Notes (Signed)
Cindy Meza 86 y.o. 04-27-1936 [id number]  Assessment & Plan:   Encounter Diagnoses  Name Primary?   Chronic constipation Yes   Internal hemorrhoids    Periumbilical abdominal pain- ? from midodrine     She is improved and will continue current therapy with MiraLAX Benefiber and prunes.  It sounds like midodrine may be causing her abdominal pain but life is tolerable on that and that is an important medication for her.   Premier protein shakes instead of boost  F/U prn    Subjective:   Chief Complaint: Follow-up of abdominal pain and constipation  HPI Cindy Meza is an 87 year old African-American woman who has had some chronic abdominal pain problems and was seen in October 2023 by Cindy Meza with constipation exacerbation.  She also had some hemorrhoidal bleeding that was successfully treated with hydrocortisone cream.  She was given recommendations to take MiraLAX daily as well as Benefiber daily and add 2 prunes to her regimen and she has done well with good defecation pattern since then.  She did try some boost as recommended by Cindy Meza, but thought it was too sugary.  She has been thin and wanted to try to gain some weight which she has not done but she has not lost significant weight.  She has some mild periumbilical pain still she notices it after eating particularly after eating breakfast but also taking her medications including midodrine and thinks that may be the cause.  That is listed as a side effect.  Wt Readings from Last 3 Encounters:  09/07/22 95 lb (43.1 kg)  09/05/22 95 lb (43.1 kg)  08/27/22 97 lb (44 kg)     Allergies  Allergen Reactions   Fosamax [Alendronate Sodium]     Paresthesias, abd pain   Gabapentin     dizziness   Lidocaine Swelling    Of face and lips.  Novocaine does same thing.   Midodrine     Nausea    Risedronate Sodium     Knee pain   Shellfish Allergy Hives   Statins     REACTION: pains   Current Meds   Medication Sig   aspirin 81 MG tablet Take 81 mg by mouth daily.   Calcium-Magnesium-Vitamin D (CALCIUM 500 PO) Take 1 tablet by mouth 2 (two) times daily with a meal. With lunch and supper   cholecalciferol (VITAMIN D3) 25 MCG (1000 UNIT) tablet Take 1,000 Units by mouth daily.   [employer] OMEGA-3 KRILL OIL 500 MG CAPS Take 500 capsules by mouth daily.   Cyanocobalamin (VITAMIN B-12) 1000 MCG SUBL Place 1 tablet (1,000 mcg total) under the tongue in the morning.   denosumab (PROLIA) 60 MG/ML SOSY injection Inject 60 mg into the skin every 6 (six) months.   estradiol (ESTRACE) 0.1 MG/GM vaginal cream Place vaginally.   fludrocortisone (FLORINEF) 0.1 MG tablet Take 0.5 tablets (0.05 mg total) by mouth 2 (two) times daily. At 8 am and 2 pm   fluticasone-salmeterol (ADVAIR HFA) 115-21 MCG/ACT inhaler Inhale 1-2 puffs into the lungs 2 (two) times daily.   pantoprazole (PROTONIX) 40 MG tablet Take 1 tablet (40 mg total) by mouth daily.   polyethylene glycol (MIRALAX / GLYCOLAX) 17 g packet Take 17 g by mouth daily.   Wheat Dextrin (BENEFIBER DRINK MIX PO) Take 1 Capful by mouth daily.   Past Medical History:  Diagnosis Date   Allergy    Anemia    Asthma    Dr. Melvyn Novas   Blood transfusion  without reported diagnosis    Breast cyst    Left   Cataract    Dr. Gershon Crane   Complication of anesthesia    ' I HAD HALLUCINATIONS WHEN THEY DID MY HIP "   GERD (gastroesophageal reflux disease)    Heart murmur    Hemorrhoids    History of colonic polyps Cindy Meza   hx of adenomas (small) 1998   OA (osteoarthritis)    Orthostatic hypotension    Osteopenia    PAC (premature atrial contraction)    Vitamin D deficiency    Past Surgical History:  Procedure Laterality Date   ABDOMINAL HYSTERECTOMY     complete   APPENDECTOMY     BREAST CYST EXCISION     CATARACT EXTRACTION, BILATERAL     04/23/18 left eye, 04/02/18 right eye   COLONOSCOPY  Multiple   Adenomatous colon polyps   ESOPHAGOGASTRODUODENOSCOPY   2010   INTRAMEDULLARY (IM) NAIL INTERTROCHANTERIC Right 06/13/2016   Procedure: INTRAMEDULLARY (IM) NAIL INTERTROCHANTRIC;  Surgeon: Tania Ade, MD;  Location: [facility];  Service: Orthopedics;  Laterality: Right;   TONSILLECTOMY     Social History   Social History Narrative   She lives alone in a [place] home.  No children.   Retired Education officer, museum.   Highest level of education:  Masters degree      Right Handed    Lives in one story home / lives alone    family history includes Diabetes (age of onset: 86) in her mother; Hypertension in an other family member.   Review of Systems nocturia  Objective:   Physical Exam BP 122/76   Pulse 76   Ht '5\' 2"'$  (1.575 m)   Wt 95 lb (43.1 kg)   BMI 17.38 kg/m

## 2022-09-07 NOTE — Patient Instructions (Signed)
Continue current therapy (Benefiber, MiraLax and prunes).  Ask Dr. Alain Marion about the nighttime urination.

## 2022-09-13 ENCOUNTER — Ambulatory Visit: Payer: Medicare Other | Admitting: Internal Medicine

## 2022-09-21 ENCOUNTER — Ambulatory Visit
Admission: RE | Admit: 2022-09-21 | Discharge: 2022-09-21 | Disposition: A | Discharge status: Home / Self Care | Payer: Medicare Other | Source: Ambulatory Visit | Attending: Neurology | Admitting: Neurology

## 2022-09-21 DIAGNOSIS — R292 Abnormal reflex: Secondary | ICD-10-CM

## 2022-09-21 DIAGNOSIS — R29898 Other symptoms and signs involving the musculoskeletal system: Secondary | ICD-10-CM

## 2022-09-27 ENCOUNTER — Ambulatory Visit (INDEPENDENT_AMBULATORY_CARE_PROVIDER_SITE_OTHER): Payer: Medicare Other | Admitting: Internal Medicine

## 2022-09-27 ENCOUNTER — Encounter: Payer: Self-pay | Admitting: Internal Medicine

## 2022-09-27 VITALS — BP 118/70 | HR 82 | Temp 98.3°F | Ht 62.0 in | Wt 94.0 lb

## 2022-09-27 DIAGNOSIS — E559 Vitamin D deficiency, unspecified: Secondary | ICD-10-CM

## 2022-09-27 DIAGNOSIS — W19XXXS Unspecified fall, sequela: Secondary | ICD-10-CM | POA: Diagnosis not present

## 2022-09-27 DIAGNOSIS — N289 Disorder of kidney and ureter, unspecified: Secondary | ICD-10-CM | POA: Diagnosis not present

## 2022-09-27 DIAGNOSIS — J4520 Mild intermittent asthma, uncomplicated: Secondary | ICD-10-CM | POA: Diagnosis not present

## 2022-09-27 DIAGNOSIS — M810 Age-related osteoporosis without current pathological fracture: Secondary | ICD-10-CM

## 2022-09-27 LAB — CBC WITH DIFFERENTIAL/PLATELET
Basophils Absolute: 0 10*3/uL (ref 0.0–0.1)
Basophils Relative: 0.6 % (ref 0.0–3.0)
Eosinophils Absolute: 0.1 10*3/uL (ref 0.0–0.7)
Eosinophils Relative: 1 % (ref 0.0–5.0)
HCT: 37.3 % (ref 36.0–46.0)
Hemoglobin: 12.3 g/dL (ref 12.0–15.0)
Lymphocytes Relative: 33.7 % (ref 12.0–46.0)
Lymphs Abs: 2.1 10*3/uL (ref 0.7–4.0)
MCHC: 33.1 g/dL (ref 30.0–36.0)
MCV: 94 fl (ref 78.0–100.0)
Monocytes Absolute: 0.5 10*3/uL (ref 0.1–1.0)
Monocytes Relative: 7.7 % (ref 3.0–12.0)
Neutro Abs: 3.5 10*3/uL (ref 1.4–7.7)
Neutrophils Relative %: 57 % (ref 43.0–77.0)
Platelets: 269 10*3/uL (ref 150.0–400.0)
RBC: 3.97 Mil/uL (ref 3.87–5.11)
RDW: 12.4 % (ref 11.5–15.5)
WBC: 6.1 10*3/uL (ref 4.0–10.5)

## 2022-09-27 LAB — COMPREHENSIVE METABOLIC PANEL
ALT: 12 U/L (ref 0–35)
AST: 17 U/L (ref 0–37)
Albumin: 4.2 g/dL (ref 3.5–5.2)
Alkaline Phosphatase: 33 U/L — ABNORMAL LOW (ref 39–117)
BUN: 22 mg/dL (ref 6–23)
CO2: 31 mEq/L (ref 19–32)
Calcium: 9.6 mg/dL (ref 8.4–10.5)
Chloride: 102 mEq/L (ref 96–112)
Creatinine, Ser: 1.21 mg/dL — ABNORMAL HIGH (ref 0.40–1.20)
GFR: 41.1 mL/min — ABNORMAL LOW (ref >60.00)
Glucose, Bld: 90 mg/dL (ref 70–99)
Potassium: 4.2 mEq/L (ref 3.5–5.1)
Sodium: 137 mEq/L (ref 135–145)
Total Bilirubin: 0.4 mg/dL (ref 0.2–1.2)
Total Protein: 7.6 g/dL (ref 6.0–8.3)

## 2022-09-27 NOTE — Assessment & Plan Note (Signed)
Prolia q 6 mo Vit D and K3

## 2022-09-27 NOTE — Progress Notes (Signed)
Subjective:  Patient ID: Cindy Meza, female    DOB: 06-01-1936  Age: 87 y.o. MRN: [id number]  CC: Follow-up (3 mnth f/u)   HPI Cindy Meza presents for osteoporosis, fatigue, asthma Pt hit the lip on the dryer yesterday  Outpatient Medications Prior to Visit  Medication Sig Dispense Refill   aspirin 81 MG tablet Take 81 mg by mouth daily.     Calcium-Magnesium-Vitamin D (CALCIUM 500 PO) Take 1 tablet by mouth 2 (two) times daily with a meal. With lunch and supper     cholecalciferol (VITAMIN D3) 25 MCG (1000 UNIT) tablet Take 1,000 Units by mouth daily.     CVS OMEGA-3 KRILL OIL 500 MG CAPS Take 500 capsules by mouth daily.     Cyanocobalamin (VITAMIN B-12) 1000 MCG SUBL Place 1 tablet (1,000 mcg total) under the tongue in the morning. 100 tablet 3   denosumab (PROLIA) 60 MG/ML SOSY injection Inject 60 mg into the skin every 6 (six) months.     estradiol (ESTRACE) 0.1 MG/GM vaginal cream Place vaginally.     fludrocortisone (FLORINEF) 0.1 MG tablet Take 0.5 tablets (0.05 mg total) by mouth 2 (two) times daily. At 8 am and 2 pm 180 tablet 3   fluticasone-salmeterol (ADVAIR HFA) 115-21 MCG/ACT inhaler Inhale 1-2 puffs into the lungs 2 (two) times daily. 12 g 11   loratadine (CLARITIN) 10 MG tablet Take 10 mg by mouth daily.  prn     pantoprazole (PROTONIX) 40 MG tablet Take 1 tablet (40 mg total) by mouth daily. 90 tablet 3   polyethylene glycol (MIRALAX / GLYCOLAX) 17 g packet Take 17 g by mouth daily.     Wheat Dextrin (BENEFIBER DRINK MIX PO) Take 1 Capful by mouth daily.     No facility-administered medications prior to visit.    ROS: Review of Systems  Constitutional:  Positive for fatigue. Negative for activity change, appetite change, chills and unexpected weight change.  HENT:  Negative for congestion, mouth sores and sinus pressure.   Eyes:  Negative for visual disturbance.  Respiratory:  Negative for cough and chest tightness.   Gastrointestinal:   Negative for abdominal pain and nausea.  Genitourinary:  Negative for difficulty urinating, frequency and vaginal pain.  Musculoskeletal:  Positive for arthralgias, back pain and gait problem.  Skin:  Negative for pallor and rash.  Neurological:  Negative for dizziness, tremors, weakness, numbness and headaches.  Psychiatric/Behavioral:  Negative for confusion, sleep disturbance and suicidal ideas.     Objective:  BP 118/70 (BP Location: Left Arm, Patient Position: Sitting, Cuff Size: Normal)   Pulse 82   Temp 98.3 F (36.8 C) (Oral)   Ht 5\' 2"  (1.575 m)   Wt 94 lb (42.6 kg)   SpO2 93%   BMI 17.19 kg/m   BP Readings from Last 3 Encounters:  09/27/22 118/70  09/07/22 122/76  09/05/22 125/76    Wt Readings from Last 3 Encounters:  09/27/22 94 lb (42.6 kg)  09/07/22 95 lb (43.1 kg)  09/05/22 95 lb (43.1 kg)    Physical Exam Constitutional:      General: She is not in acute distress.    Appearance: Normal appearance. She is well-developed.  HENT:     Head: Normocephalic.     Right Ear: External ear normal.     Left Ear: External ear normal.     Nose: Nose normal.  Eyes:     General:        Right eye:  No discharge.        Left eye: No discharge.     Conjunctiva/sclera: Conjunctivae normal.     Pupils: Pupils are equal, round, and reactive to light.  Neck:     Thyroid: No thyromegaly.     Vascular: No JVD.     Trachea: No tracheal deviation.  Cardiovascular:     Rate and Rhythm: Normal rate and regular rhythm.     Heart sounds: Normal heart sounds.  Pulmonary:     Effort: No respiratory distress.     Breath sounds: No stridor. No wheezing.  Abdominal:     General: Bowel sounds are normal. There is no distension.     Palpations: Abdomen is soft. There is no mass.     Tenderness: There is no abdominal tenderness. There is no guarding or rebound.  Musculoskeletal:        General: No tenderness.     Cervical back: Normal range of motion and neck supple. No  rigidity.  Lymphadenopathy:     Cervical: No cervical adenopathy.  Skin:    Findings: No erythema or rash.  Neurological:     Mental Status: She is oriented to person, place, and time.     Cranial Nerves: No cranial nerve deficit.     Motor: No abnormal muscle tone.     Coordination: Coordination normal.     Gait: Gait abnormal.     Deep Tendon Reflexes: Reflexes normal.  Psychiatric:        Behavior: Behavior normal.        Thought Content: Thought content normal.        Judgment: Judgment normal.   LS w/pain L upper lip /swelling  Lab Results  Component Value Date   WBC 5.9 05/18/2022   HGB 11.5 (L) 05/18/2022   HCT 34.8 (L) 05/18/2022   PLT 224.0 05/18/2022   GLUCOSE 90 05/18/2022   CHOL 227 (H) 07/12/2021   TRIG 45 07/12/2021   HDL 107 07/12/2021   LDLDIRECT 104.0 03/17/2013   LDLCALC 112 (H) 07/12/2021   ALT 14 05/18/2022   AST 22 05/18/2022   NA 138 05/18/2022   K 4.1 05/18/2022   CL 102 05/18/2022   CREATININE 1.20 05/18/2022   BUN 18 05/18/2022   CO2 32 05/18/2022   TSH 1.43 03/28/2022   INR 1.04 06/12/2016   HGBA1C 5.0 12/27/2021    MR LUMBAR SPINE WO CONTRAST  Result Date: 09/25/2022 CLINICAL DATA:  Hyperreflexia, leg weakness. Low back pain radiating into right buttocks/leg. EXAM: MRI LUMBAR SPINE WITHOUT CONTRAST TECHNIQUE: Multiplanar, multisequence MR imaging of the lumbar spine was performed. No intravenous contrast was administered. COMPARISON:  Lumbar spine radiographs 07/20/2020. FINDINGS: Segmentation: Conventional numbering is assumed with 5 non-rib-bearing, lumbar type vertebral bodies. Alignment:  3 mm anterolisthesis of L5 on S1. Vertebrae:  Normal vertebral body heights and marrow signal. Conus medullaris and cauda equina: Conus extends to the L1 level. Conus and cauda equina appear normal. Paraspinal and other soft tissues: Unremarkable. Disc levels: T12-L1:  Unremarkable. L1-L2:  Unremarkable. L2-L3: Disc bulge and facet arthropathy results in  mild bilateral neural foraminal narrowing. No significant spinal canal stenosis. L3-L4: Right eccentric disc bulge with displacement of the traversing right L4 nerve root in the lateral recess. Mild bilateral neural foraminal narrowing. L4-L5: Right eccentric disc bulge and facet arthropathy results in compression of the traversing right L5 nerve root in the lateral recess. Mild bilateral neural foraminal narrowing. L5-S1: Disc bulge, anterolisthesis and facet arthropathy results in  mild bilateral neural foraminal narrowing. No spinal canal stenosis. IMPRESSION: 1. Right eccentric disc bulges at L3-L4 and L4-L5 result in displacement of the traversing right L4 nerve root and compression of the traversing right L5 nerve root in the lateral recess. 2. No high-grade spinal canal stenosis. Electronically Signed   By: Emmit Alexanders M.D.   On: 09/25/2022 16:32    Assessment & Plan:   Problem List Items Addressed This Visit       Respiratory   Asthma    Chronic  Advair MDI - use qd        Musculoskeletal and Integument   Osteoporosis    Prolia q 6 mo Vit D and K3        Genitourinary   Renal insufficiency    Hydrate well, take fish oil        Other   Vitamin D deficiency - Primary    Take Vit D3+K2      Fall    Overall better         No orders of the defined types were placed in this encounter.     Follow-up: Return in about 3 months (around 12/28/2022) for a follow-up visit.  [name], MD

## 2022-09-27 NOTE — Assessment & Plan Note (Signed)
Hydrate well, take fish oil 

## 2022-09-27 NOTE — Assessment & Plan Note (Signed)
Overall better 

## 2022-09-27 NOTE — Assessment & Plan Note (Signed)
Chronic  Advair MDI - use qd

## 2022-09-27 NOTE — Assessment & Plan Note (Signed)
Take Vit D3+K2 

## 2022-09-27 NOTE — Patient Instructions (Addendum)
Take Vit D3+K2  Blue-Emu cream use 2-3 times a day

## 2022-09-28 LAB — URINALYSIS
Bilirubin Urine: NEGATIVE
Hgb urine dipstick: NEGATIVE
Leukocytes,Ua: NEGATIVE
Nitrite: NEGATIVE
Specific Gravity, Urine: 1.02 (ref 1.000–1.030)
Urine Glucose: 100 — AB
Urobilinogen, UA: 0.2 (ref 0.0–1.0)
pH: 6 (ref 5.0–8.0)

## 2022-10-11 NOTE — Telephone Encounter (Signed)
Prolia VOB initiated via [email or web address]  Last OV: 08/08/22 Next OV:  Last Prolia inj 05/29/22 Next Prolia inj due 11/28/22

## 2022-11-10 NOTE — Telephone Encounter (Signed)
Prior auth required for PROLIA  PA PROCESS DETAILS: Effective 07/09/2021 if the patient is new to Prolia, Prior authorization and Step Therapy are required & not on file. Please go to [email or web address] or call [phone] to initiate the prior authorization. For exception to the policy please visit [email or web address] and review Policy Number [id number] 

## 2022-11-19 NOTE — Progress Notes (Unsigned)
Cardiology Office Note   Date:  11/20/2022   ID:  Cindy Meza, DOB [birth year], MRN [id number]  PCP:  Tresa Garter, MD  Cardiologist:   Dietrich Pates, MD   Pt presents for f/u of orthostatic hypotension    History of Present Illness: Cindy Meza is a 87 y.o. female with a history  fatigue, orthostatic intolerance mitral regurgitation, asthma, GERD The pt is currently on   florinef  Myovue in 2017 and 2019 were normal   Echo in 2019 LVEF 50 to 55% with mild diastolic dysfunction and mod MR      SHe has been taking Crestor 1x per week   Cut back due to consitpation  I saw the pt in Dec 2023   Since seen she notes intermittent dizziness with bending, standing up quick  No syncope Breathing is OK    Outpatient Medications Prior to Visit  Medication Sig Dispense Refill   aspirin 81 MG tablet Take 81 mg by mouth daily.     Calcium-Magnesium-Vitamin D (CALCIUM 500 PO) Take 1 tablet by mouth 2 (two) times daily with a meal. With lunch and supper     cholecalciferol (VITAMIN D3) 25 MCG (1000 UNIT) tablet Take 1,000 Units by mouth daily.     CVS OMEGA-3 KRILL OIL 500 MG CAPS Take 500 capsules by mouth daily.     Cyanocobalamin (VITAMIN B-12) 1000 MCG SUBL Place 1 tablet (1,000 mcg total) under the tongue in the morning. 100 tablet 3   denosumab (PROLIA) 60 MG/ML SOSY injection Inject 60 mg into the skin every 6 (six) months.     estradiol (ESTRACE) 0.1 MG/GM vaginal cream Place vaginally.     fludrocortisone (FLORINEF) 0.1 MG tablet Take 0.5 tablets (0.05 mg total) by mouth 2 (two) times daily. At 8 am and 2 pm 180 tablet 3   fluticasone-salmeterol (ADVAIR HFA) 115-21 MCG/ACT inhaler Inhale 1-2 puffs into the lungs 2 (two) times daily. 12 g 11   loratadine (CLARITIN) 10 MG tablet Take 10 mg by mouth daily.  prn     pantoprazole (PROTONIX) 40 MG tablet Take 1 tablet (40 mg total) by mouth daily. 90 tablet 3   polyethylene glycol (MIRALAX / GLYCOLAX) 17 g packet Take 17  g by mouth daily.     Wheat Dextrin (BENEFIBER DRINK MIX PO) Take 1 Capful by mouth daily.     No facility-administered medications prior to visit.     Allergies:   Fosamax [alendronate sodium], Gabapentin, Lidocaine, Midodrine, Risedronate sodium, Shellfish allergy, and Statins   Past Medical History:  Diagnosis Date   Allergy    Anemia    Asthma    Dr. Sherene Sires   Blood transfusion without reported diagnosis    Breast cyst    Left   Cataract    Dr. Nile Riggs   Complication of anesthesia    ' I HAD HALLUCINATIONS WHEN THEY DID MY HIP "   GERD (gastroesophageal reflux disease)    Heart murmur    Hemorrhoids    History of colonic polyps [facility]   hx of adenomas (small) 1998   OA (osteoarthritis)    Orthostatic hypotension    Osteopenia    PAC (premature atrial contraction)    Vitamin D deficiency     Past Surgical History:  Procedure Laterality Date   ABDOMINAL HYSTERECTOMY     complete   APPENDECTOMY     BREAST CYST EXCISION     CATARACT EXTRACTION, BILATERAL  04/23/18 left eye, 04/02/18 right eye   COLONOSCOPY  Multiple   Adenomatous colon polyps   ESOPHAGOGASTRODUODENOSCOPY  2010   INTRAMEDULLARY (IM) NAIL INTERTROCHANTERIC Right 06/13/2016   Procedure: INTRAMEDULLARY (IM) NAIL INTERTROCHANTRIC;  Surgeon: Jones Broom, MD;  Location: [facility];  Service: Orthopedics;  Laterality: Right;   TONSILLECTOMY       Social History:  The patient  reports that she has never smoked. She has never used smokeless tobacco. She reports that she does not currently use alcohol. She reports that she does not use drugs.   Family History:  The patient's family history includes Diabetes (age of onset: 63) in her mother; Hypertension in an other family member.  No history of CAD known    Mother had PPM    ROS:  Please see the history of present illness. All other systems are reviewed and  Negative to the above problem except as noted.    PHYSICAL EXAM: VS:  BP 124/82   Pulse 68    Wt 98 lb 3.2 oz (44.5 kg)   SpO2 96%   BMI 17.96 kg/m   Orthostatic :  Layin 120/80  P 60  Sitting  118/68  P 55  Standing 124/77  P 81  Standing 4 min 120/66  P76  GEN: Thin 87 year old in no acute distress  HEENT: normal  Neck: JVP is normal  No carotid bruit Cardiac: RRR; no murmur  No LE  edema  Respiratory:  clear to auscultation   GI: soft, nontender  No hepatomegaly  E]  EKG:  EKG is not ordered  Lipid Panel    Component Value Date/Time   CHOL 227 (H) 07/12/2021 1222   TRIG 45 07/12/2021 1222   HDL 107 07/12/2021 1222   CHOLHDL 2.1 07/12/2021 1222   CHOLHDL 2 04/10/2018 1207   VLDL 13.6 04/10/2018 1207   LDLCALC 112 (H) 07/12/2021 1222   LDLDIRECT 104.0 03/17/2013 1026      Wt Readings from Last 3 Encounters:  11/20/22 98 lb 3.2 oz (44.5 kg)  09/27/22 94 lb (42.6 kg)  09/07/22 95 lb (43.1 kg)      ASSESSMENT AND PLAN:  1  Orthostatic hypotension  Doing OK  Mild dizziness with standing   Orthostatics negative   Would continue florinef 0.05 at 8 and 2 pm   2  CAD Plaquing of aorta and coronary arteries  Normal myovue in Dec 2019  Pt denies symptoms  On ASA     3 Hyperlipidemia LDL  112 in 2023   Intolerant to statins  Follow    F/U next winter    Current medicines are reviewed at length with the patient today.  The patient does not have concerns regarding medicines.   Signed, Dietrich Pates, MD  11/20/2022 11:20 PM    [facility] [address] [place], [place], [place]  [address] Phone: [phone]; Fax: [phone]

## 2022-11-20 ENCOUNTER — Encounter: Payer: Self-pay | Admitting: Internal Medicine

## 2022-11-20 ENCOUNTER — Ambulatory Visit: Payer: Medicare Other | Attending: Internal Medicine | Admitting: Internal Medicine

## 2022-11-20 VITALS — BP 124/82 | HR 68 | Wt 98.2 lb

## 2022-11-20 DIAGNOSIS — R42 Dizziness and giddiness: Secondary | ICD-10-CM

## 2022-11-20 NOTE — Patient Instructions (Signed)
Medication Instructions:   *If you need a refill on your cardiac medications before your next appointment, please call your pharmacy*   Lab Work:  If you have labs (blood work) drawn today and your tests are completely normal, you will receive your results only by: MyChart Message (if you have MyChart) OR A paper copy in the mail If you have any lab test that is abnormal or we need to change your treatment, we will call you to review the results.   Testing/Procedures:    Follow-Up: At [facility], you and your health needs are our priority.  As part of our continuing mission to provide you with exceptional heart care, we have created designated Provider Care Teams.  These Care Teams include your primary Cardiologist (physician) and Advanced Practice Providers (APPs -  Physician Assistants and Nurse Practitioners) who all work together to provide you with the care you need, when you need it.  We recommend signing up for the patient portal called "MyChart".  Sign up information is provided on this After Visit Summary.  MyChart is used to connect with patients for Virtual Visits (Telemedicine).  Patients are able to view lab/test results, encounter notes, upcoming appointments, etc.  Non-urgent messages can be sent to your provider as well.   To learn more about what you can do with MyChart, go to [email or web address].    Your next appointment:   6 month(s)  Provider:   Paula Ross, MD     Other Instructions   

## 2022-11-23 NOTE — Telephone Encounter (Signed)
Pt ready for scheduling on or after 11/28/22  Out-of-pocket cost due at time of visit: $0  Primary: [employer] Medicare PPO Prolia co-insurance: 0% Admin fee co-insurance: 0%  Deductible: does not apply  Prior Auth APPROVED PA# [id number] Valid: 11/23/22-11/23/23  Secondary: N/A Prolia co-insurance:  Admin fee co-insurance:  Deductible:  Prior Auth:  PA# Valid:     ** This summary of benefits is an estimation of the patient's out-of-pocket cost. Exact cost may very based on individual plan coverage.

## 2022-11-23 NOTE — Telephone Encounter (Signed)
Prior Auth APPROVED PA# [id number] Valid: 11/23/22-11/23/23

## 2022-11-26 ENCOUNTER — Other Ambulatory Visit: Payer: Self-pay | Admitting: Internal Medicine

## 2022-11-30 ENCOUNTER — Ambulatory Visit (INDEPENDENT_AMBULATORY_CARE_PROVIDER_SITE_OTHER): Payer: Medicare Other

## 2022-11-30 VITALS — BP 130/70 | HR 84 | Ht 60.5 in | Wt 97.8 lb

## 2022-11-30 DIAGNOSIS — M810 Age-related osteoporosis without current pathological fracture: Secondary | ICD-10-CM

## 2022-11-30 MED ORDER — DENOSUMAB 60 MG/ML ~~LOC~~ SOSY
60.0000 mg | PREFILLED_SYRINGE | Freq: Once | SUBCUTANEOUS | Status: AC
  Administered 2022-11-30: 60 mg via SUBCUTANEOUS

## 2022-11-30 NOTE — Progress Notes (Signed)
After obtaining consent, and per orders of Dr. Lonzo Cloud, injection of Prolia 60mg /mL Left Arm SQ given by Pollie Meyer. Patient instructed to remain in clinic for 20 minutes afterwards, and to report any adverse reaction to me immediately.

## 2022-12-04 ENCOUNTER — Telehealth: Payer: Self-pay | Admitting: Internal Medicine

## 2022-12-04 NOTE — Telephone Encounter (Signed)
Please let the patient know that her bone density shows that the Prolia is not working well for her she continues to have worsening osteoporosis.    Please schedule the patient to see me as soon as possible, I have openings today as well as tomorrow unless she would like to come next week

## 2022-12-04 NOTE — Telephone Encounter (Signed)
Patient advised and has been scheduled for 12/14/22 at 3pm

## 2022-12-06 ENCOUNTER — Encounter: Payer: Self-pay | Admitting: Internal Medicine

## 2022-12-06 NOTE — Progress Notes (Unsigned)
Subjective:    Patient ID: Cindy Meza, female    DOB: [birth year], 87 y.o.   MRN: [id number]      HPI Demri is here for  Chief Complaint  Patient presents with   Urinary Frequency    Lower back pain, abdominal pain   frequent urination - has to get up 4-5 times at night.  Denies h/o UTI's.  Does not drink caffeine.  Drinks water throughout day.  Does not drink water after 9 pm.  She does have increased urination during the day. Denies urinary incontinence.       Medications and allergies reviewed with patient and updated if appropriate.  Current Outpatient Medications on File Prior to Visit  Medication Sig Dispense Refill   aspirin 81 MG tablet Take 81 mg by mouth daily.     Calcium-Magnesium-Vitamin D (CALCIUM 500 PO) Take 1 tablet by mouth 2 (two) times daily with a meal. With lunch and supper     cholecalciferol (VITAMIN D3) 25 MCG (1000 UNIT) tablet Take 1,000 Units by mouth daily.     [employer]S OMEGA-3 KRILL OIL 500 MG CAPS Take 500 capsules by mouth daily.     Cyanocobalamin (VITAMIN B-12) 1000 MCG SUBL Place 1 tablet (1,000 mcg total) under the tongue in the morning. 100 tablet 3   denosumab (PROLIA) 60 MG/ML SOSY injection Inject 60 mg into the skin every 6 (six) months.     estradiol (ESTRACE) 0.1 MG/GM vaginal cream Place vaginally.     fludrocortisone (FLORINEF) 0.1 MG tablet Take 0.5 tablets (0.05 mg total) by mouth 2 (two) times daily. At 8 am and 2 pm 180 tablet 3   fluticasone-salmeterol (ADVAIR HFA) 115-21 MCG/ACT inhaler Inhale 1-2 puffs into the lungs 2 (two) times daily. 12 g 11   loratadine (CLARITIN) 10 MG tablet Take 10 mg by mouth daily.  prn     pantoprazole (PROTONIX) 40 MG tablet TAKE 1 TABLET(40 MG) BY MOUTH DAILY 90 tablet 3   polyethylene glycol (MIRALAX / GLYCOLAX) 17 g packet Take 17 g by mouth daily.     Wheat Dextrin (BENEFIBER DRINK MIX PO) Take 1 Capful by mouth daily.     No current facility-administered medications on file prior to  visit.    Review of Systems  Gastrointestinal:  Positive for abdominal pain.  Genitourinary:  Positive for frequency and urgency. Negative for difficulty urinating, dysuria and hematuria.       No cloudy urine and no odor  Musculoskeletal:  Positive for back pain (lower back).       Objective:   Vitals:   12/07/22 1401  BP: 118/60  Pulse: 92  Temp: 97.9 F (36.6 C)  SpO2: 98%   BP Readings from Last 3 Encounters:  12/07/22 118/60  11/30/22 130/70  11/20/22 124/82   Wt Readings from Last 3 Encounters:  12/07/22 97 lb (44 kg)  11/30/22 97 lb 12.8 oz (44.4 kg)  11/20/22 98 lb 3.2 oz (44.5 kg)   Body mass index is 18.63 kg/m.    Physical Exam Constitutional:      General: She is not in acute distress.    Appearance: Normal appearance. She is not ill-appearing.  HENT:     Head: Normocephalic and atraumatic.  Abdominal:     General: There is no distension.     Palpations: Abdomen is soft.     Tenderness: There is no abdominal tenderness. There is no guarding or rebound.  Skin:    General: Skin  is warm and dry.  Neurological:     Mental Status: She is alert.          MR LUMBAR SPINE WO CONTRAST CLINICAL DATA:  Hyperreflexia, leg weakness. Low back pain radiating into right buttocks/leg.  EXAM: MRI LUMBAR SPINE WITHOUT CONTRAST  TECHNIQUE: Multiplanar, multisequence MR imaging of the lumbar spine was performed. No intravenous contrast was administered.  COMPARISON:  Lumbar spine radiographs 07/20/2020.  FINDINGS: Segmentation: Conventional numbering is assumed with 5 non-rib-bearing, lumbar type vertebral bodies.  Alignment:  3 mm anterolisthesis of L5 on S1.  Vertebrae:  Normal vertebral body heights and marrow signal.  Conus medullaris and cauda equina: Conus extends to the L1 level. Conus and cauda equina appear normal.  Paraspinal and other soft tissues: Unremarkable.  Disc levels:  T12-L1:  Unremarkable.  L1-L2:   Unremarkable.  L2-L3: Disc bulge and facet arthropathy results in mild bilateral neural foraminal narrowing. No significant spinal canal stenosis.  L3-L4: Right eccentric disc bulge with displacement of the traversing right L4 nerve root in the lateral recess. Mild bilateral neural foraminal narrowing.  L4-L5: Right eccentric disc bulge and facet arthropathy results in compression of the traversing right L5 nerve root in the lateral recess. Mild bilateral neural foraminal narrowing.  L5-S1: Disc bulge, anterolisthesis and facet arthropathy results in mild bilateral neural foraminal narrowing. No spinal canal stenosis.  IMPRESSION: 1. Right eccentric disc bulges at L3-L4 and L4-L5 result in displacement of the traversing right L4 nerve root and compression of the traversing right L5 nerve root in the lateral recess. 2. No high-grade spinal canal stenosis.  Electronically Signed   By: Orvan Falconer M.D.   On: 09/25/2022 16:32    Assessment & Plan:    See Problem List for Assessment and Plan of chronic medical problems.

## 2022-12-07 ENCOUNTER — Ambulatory Visit (INDEPENDENT_AMBULATORY_CARE_PROVIDER_SITE_OTHER): Payer: Medicare Other | Admitting: Internal Medicine

## 2022-12-07 ENCOUNTER — Encounter: Payer: Self-pay | Admitting: Internal Medicine

## 2022-12-07 VITALS — BP 118/60 | HR 92 | Temp 97.9°F | Ht 60.5 in | Wt 97.0 lb

## 2022-12-07 DIAGNOSIS — I951 Orthostatic hypotension: Secondary | ICD-10-CM | POA: Diagnosis not present

## 2022-12-07 DIAGNOSIS — R350 Frequency of micturition: Secondary | ICD-10-CM

## 2022-12-07 LAB — URINALYSIS, ROUTINE W REFLEX MICROSCOPIC
Bilirubin Urine: NEGATIVE
Hgb urine dipstick: NEGATIVE
Leukocytes,Ua: NEGATIVE
Nitrite: NEGATIVE
RBC / HPF: NONE SEEN (ref 0–?)
Specific Gravity, Urine: 1.02 (ref 1.000–1.030)
Total Protein, Urine: NEGATIVE
Urine Glucose: NEGATIVE
Urobilinogen, UA: 0.2 (ref 0.0–1.0)
pH: 5.5 (ref 5.0–8.0)

## 2022-12-07 NOTE — Telephone Encounter (Signed)
Last Prolia inj 11/30/22 Next Prolia inj due 06/03/23

## 2022-12-07 NOTE — Patient Instructions (Addendum)
Urine tests were ordered.   The lab is on the first floor.    Decrease water intake in the evening.    Medications changes include :   none      Overactive Bladder, Adult  Overactive bladder is a condition in which a person has a sudden and frequent need to urinate. A person might also leak urine if he or she cannot get to the bathroom fast enough (urinary incontinence). Sometimes, symptoms can interfere with work or social activities. What are the causes? Overactive bladder is associated with poor nerve signals between your bladder and your brain. Your bladder may get the signal to empty before it is full. You may also have very sensitive muscles that make your bladder squeeze too soon. This condition may also be caused by other factors, such as: Medical conditions: Urinary tract infection. Infection of nearby tissues. Prostate enlargement. Bladder stones, inflammation, or tumors. Diabetes. Muscle or nerve weakness, especially from these conditions: A spinal cord injury. Stroke. Multiple sclerosis. Parkinson's disease. Other causes: Surgery on the uterus or urethra. Drinking too much caffeine or alcohol. Certain medicines, especially those that eliminate extra fluid in the body (diuretics). Constipation. What increases the risk? You may be at greater risk for overactive bladder if you: Are an older adult. Smoke. Are going through menopause. Have prostate problems. Have a neurological disease, such as stroke, dementia, Parkinson's disease, or multiple sclerosis (MS). Eat or drink alcohol, spicy food, caffeine, and other things that irritate the bladder. Are overweight or obese. What are the signs or symptoms? Symptoms of this condition include a sudden, strong urge to urinate. Other symptoms include: Leaking urine. Urinating 8 or more times a day. Waking up to urinate 2 or more times overnight. How is this diagnosed? This condition may be diagnosed based  on: Your symptoms and medical history. A physical exam. Blood or urine tests to check for possible causes, such as infection. You may also need to see a health care provider who specializes in urinary tract problems. This is called a urologist. How is this treated? Treatment for overactive bladder depends on the cause of your condition and whether it is mild or severe. Treatment may include: Bladder training, such as: Learning to control the urge to urinate by following a schedule to urinate at regular intervals. Doing Kegel exercises to strengthen the pelvic floor muscles that support your bladder. Special devices, such as: Biofeedback. This uses sensors to help you become aware of your body's signals. Electrical stimulation. This uses electrodes placed inside the body (implanted) or outside the body. These electrodes send gentle pulses of electricity to strengthen the nerves or muscles that control the bladder. Women may use a plastic device, called a pessary, that fits into the vagina and supports the bladder. Medicines, such as: Antibiotics to treat bladder infection. Antispasmodics to stop the bladder from releasing urine at the wrong time. Tricyclic antidepressants to relax bladder muscles. Injections of botulinum toxin type A directly into the bladder tissue to relax bladder muscles. Surgery, such as: A device may be implanted to help manage the nerve signals that control urination. An electrode may be implanted to stimulate electrical signals in the bladder. A procedure may be done to change the shape of the bladder. This is done only in very severe cases. Follow these instructions at home: Eating and drinking  Make diet or lifestyle changes recommended by your health care provider. These may include: Drinking fluids throughout the day and not  only with meals. Cutting down on caffeine or alcohol. Eating a healthy and balanced diet to prevent constipation. This may  include: Choosing foods that are high in fiber, such as beans, whole grains, and fresh fruits and vegetables. Limiting foods that are high in fat and processed sugars, such as fried and sweet foods. Lifestyle  Lose weight if needed. Do not use any products that contain nicotine or tobacco. These include cigarettes, chewing tobacco, and vaping devices, such as e-cigarettes. If you need help quitting, ask your health care provider. General instructions Take over-the-counter and prescription medicines only as told by your health care provider. If you were prescribed an antibiotic medicine, take it as told by your health care provider. Do not stop taking the antibiotic even if you start to feel better. Use any implants or pessary as told by your health care provider. If needed, wear pads to absorb urine leakage. Keep a log to track how much and when you drink, and when you need to urinate. This will help your health care provider monitor your condition. Keep all follow-up visits. This is important. Contact a health care provider if: You have a fever or chills. Your symptoms do not get better with treatment. Your pain and discomfort get worse. You have more frequent urges to urinate. Get help right away if: You are not able to control your bladder. Summary Overactive bladder refers to a condition in which a person has a sudden and frequent need to urinate. Several conditions may lead to an overactive bladder. Treatment for overactive bladder depends on the cause and severity of your condition. Making lifestyle changes, doing Kegel exercises, keeping a log, and taking medicines can help with this condition. This information is not intended to replace advice given to you by your health care provider. Make sure you discuss any questions you have with your health care provider. Document Revised: 03/14/2020 Document Reviewed: 03/14/2020 Elsevier Patient Education  2024 [employer].

## 2022-12-08 LAB — URINE CULTURE: Result:: NO GROWTH

## 2022-12-14 ENCOUNTER — Encounter: Payer: Self-pay | Admitting: Internal Medicine

## 2022-12-14 ENCOUNTER — Ambulatory Visit (INDEPENDENT_AMBULATORY_CARE_PROVIDER_SITE_OTHER): Payer: Medicare Other | Admitting: Internal Medicine

## 2022-12-14 VITALS — BP 120/86 | HR 84 | Ht 60.5 in | Wt 97.0 lb

## 2022-12-14 DIAGNOSIS — M810 Age-related osteoporosis without current pathological fracture: Secondary | ICD-10-CM

## 2022-12-14 NOTE — Progress Notes (Signed)
Name: Cindy Meza  MRN/ DOB: [id number], 06/30/1936    Age/ Sex: 87 y.o., female     PCP: Tresa Garter, MD   Reason for Endocrinology Evaluation: Osteoporosis     Initial Endocrinology Clinic Visit: 03/10/2020    PATIENT IDENTIFIER: Cindy Meza is a 87 y.o., female with a past medical history of osteoporosis, asthma, and CAD. She has followed with [facility] since 03/10/2020 for consultative assistance with management of her osteoporosis.   HISTORICAL SUMMARY:   Pt was diagnosed with osteoporosis: 2018   Menarche at age : 30 Menopausal at age : in her 18's  Fracture Hx: Right hip fracture  Hx of HRT: yes  FH of osteoporosis or hip fracture: no Prior Hx of anti-estrogenic therapy : no  Prior Hx of anti-resorptive therapy : Intolerant to Fosamax - heel pain, many years ago. Restarted ~ 2020 but stopped in 2021 due to abdominal pain.  She has been on risedronate with knee pain  First Prolia injection 05/05/2020   SUBJECTIVE:    Today (12/14/2022):  Cindy Meza is here for osteoporosis .  Last Prolia injection 11/30/2022  She has been evaluated by cardiology for orthostatic hypotension, she is on Florinef Nausea resolved  Continues with back pain with prolonged standing  Continues with intermittent leg weakness with stiffness  Yesterday she felt unbalanced  Has noted stiffness of the left hand with locking sensation  She has left arm tingling , started at the shoulder  Denies allergic reaction to the Prolia     Calcium 600  tabs BID Vitamin D3 1000 iu daily  Prolia 60 mg [place] Q 6 months       HISTORY:  Past Medical History:  Past Medical History:  Diagnosis Date   Allergy    Anemia    Asthma    Dr. Sherene Sires   Blood transfusion without reported diagnosis    Breast cyst    Left   Cataract    Dr. Nile Riggs   Complication of anesthesia    ' I HAD HALLUCINATIONS WHEN THEY DID MY HIP "   GERD (gastroesophageal reflux disease)     Heart murmur    Hemorrhoids    History of colonic polyps Gessner   hx of adenomas (small) 1998   OA (osteoarthritis)    Orthostatic hypotension    Osteopenia    PAC (premature atrial contraction)    Vitamin D deficiency    Past Surgical History:  Past Surgical History:  Procedure Laterality Date   ABDOMINAL HYSTERECTOMY     complete   APPENDECTOMY     BREAST CYST EXCISION     CATARACT EXTRACTION, BILATERAL     04/23/18 left eye, 04/02/18 right eye   COLONOSCOPY  Multiple   Adenomatous colon polyps   ESOPHAGOGASTRODUODENOSCOPY  2010   INTRAMEDULLARY (IM) NAIL INTERTROCHANTERIC Right 06/13/2016   Procedure: INTRAMEDULLARY (IM) NAIL INTERTROCHANTRIC;  Surgeon: Jones Broom, MD;  Location: [facility] OR;  Service: Orthopedics;  Laterality: Right;   TONSILLECTOMY     Social History:  reports that she has never smoked. She has never used smokeless tobacco. She reports that she does not currently use alcohol. She reports that she does not use drugs. Family History:  Family History  Problem Relation Age of Onset   Diabetes Mother 51       Deceased   Hypertension Other    Colon cancer Neg Hx    Pancreatic cancer Neg Hx    Stomach cancer Neg  Hx    Esophageal cancer Neg Hx    Rectal cancer Neg Hx      HOME MEDICATIONS: Allergies as of 12/14/2022       Reactions   Fosamax [alendronate Sodium]    Paresthesias, abd pain   Gabapentin    dizziness   Lidocaine Swelling   Of face and lips.  Novocaine does same thing.   Midodrine    Nausea   Risedronate Sodium    Knee pain   Shellfish Allergy Hives   Statins    REACTION: pains        Medication List        Accurate as of December 14, 2022  3:08 PM. If you have any questions, ask your nurse or doctor.          aspirin 81 MG tablet Take 81 mg by mouth daily.   BENEFIBER DRINK MIX PO Take 1 Capful by mouth daily.   CALCIUM 500 PO Take 1 tablet by mouth 2 (two) times daily with a meal. With lunch and supper    cholecalciferol 25 MCG (1000 UNIT) tablet Commonly known as: VITAMIN D3 Take 1,000 Units by mouth daily.   CVS Omega-3 Krill Oil 500 MG Caps Take 500 capsules by mouth daily.   denosumab 60 MG/ML Sosy injection Commonly known as: PROLIA Inject 60 mg into the skin every 6 (six) months.   estradiol 0.1 MG/GM vaginal cream Commonly known as: ESTRACE Place vaginally.   fludrocortisone 0.1 MG tablet Commonly known as: FLORINEF Take 0.5 tablets (0.05 mg total) by mouth 2 (two) times daily. At 8 am and 2 pm   fluticasone-salmeterol 115-21 MCG/ACT inhaler Commonly known as: Advair HFA Inhale 1-2 puffs into the lungs 2 (two) times daily.   loratadine 10 MG tablet Commonly known as: CLARITIN Take 10 mg by mouth daily.  prn   pantoprazole 40 MG tablet Commonly known as: PROTONIX TAKE 1 TABLET(40 MG) BY MOUTH DAILY   polyethylene glycol 17 g packet Commonly known as: MIRALAX / GLYCOLAX Take 17 g by mouth daily.   Vitamin B-12 1000 MCG Subl Place 1 tablet (1,000 mcg total) under the tongue in the morning.          OBJECTIVE:   PHYSICAL EXAM: VS: BP 120/86 (BP Location: Left Arm, Patient Position: Sitting, Cuff Size: Normal)   Pulse 84   Ht 5' 0.5" (1.537 m)   Wt 97 lb (44 kg)   SpO2 99%   BMI 18.63 kg/m    EXAM: General: Pt appears well and is in NAD  Lungs: Clear with good BS bilat   Heart: Auscultation: RRR.  Extremities:  BL LE: No pretibial edema normal ROM and strength.  Mental Status: Judgment, insight: Intact Orientation: Oriented to time, place, and person Mood and affect: No depression, anxiety, or agitation     DATA REVIEWED:    Latest Reference Range & Units 12/14/22 15:35  Calcium 8.6 - 10.4 mg/dL 9.4 (IP)  Phosphorus 2.1 - 4.3 mg/dL 3.5  Magnesium 1.5 - 2.5 mg/dL 2.3  Vitamin D, 16-XWRUEAV 30 - 100 ng/mL 58  TSH 0.40 - 4.50 mIU/L 1.27  T4,Free(Direct) 0.8 - 1.8 ng/dL 1.2    Latest Reference Range & Units 12/14/22 15:35 12/14/22 15:36   Calcium 8.6 - 10.4 mg/dL 9.4   Phosphorus 2.1 - 4.3 mg/dL 3.5   Magnesium 1.5 - 2.5 mg/dL 2.3   Total Protein 6.1 - 8.1 g/dL 7.4   Vitamin D, 40-JWJXBJY 30 - 100 ng/mL 58  Albumin ELP 3.8 - 4.8 g/dL 4.2   Alpha 1 0.2 - 0.3 g/dL 0.3   Alpha 2 0.5 - 0.9 g/dL 0.6   Beta Globulin 0.4 - 0.6 g/dL 0.5   Beta 2 0.2 - 0.5 g/dL 0.5   Gamma Globulin 0.8 - 1.7 g/dL 1.3   SPE Interp.  Pend   PTH, Intact 16 - 77 pg/mL 33   TSH 0.40 - 4.50 mIU/L 1.27   T4,Free(Direct) 0.8 - 1.8 ng/dL 1.2   Albumin MSPROF 3.6 - 5.1 g/dL 4.0   Protein Urine Random Not Estab. mg/dL  11.9  Albumin ELP, Urine %  38.9  Alpha-1-Globulin, U %  1.9  ALPHA-2-GLOBULIN, U %  14.5  Beta Globulin, U %  19.2  Gamma Globulin, U %  25.5  M Component, Ur Not Observed %  Not Observed  Please Note:   Comment       11/08/2022 Change 2023  AP  -3.0 Down 10%  LFN -2.3   Left TH -2.0 Down 2%  1/3rd radius -4.3 Down 6%      ASSESSMENT / PLAN / RECOMMENDATIONS:   1. Osteoporosis :  -Patient with reported intolerance to alendronate and risedronate -She has been on prolia 04/2020 -Most recent bone density continues to show osteoporosis at the distal radius with a T-score of -4.3, this has worsened by 6% from 2023 -She has also been noted with 10% decrease in bone density and 2% at the left total hip from 2023 -After 3 years of Prolia, I did expect much more improvement in her bone density - Emphasized the importance of optimizing calcium and vitamin D intake.  -I will screen her for secondary causes of osteoporosis such as hyperthyroid, hyperparathyroidism, phosphorus or magnesium abnormality -Will also proceed with urine and serum PEP  -My recommendation would be to proceed with anabolic agent such as PTH analogs or Evenity -Lab work shows normal vitamin D, TFTs, serum protein electrophoresis, PTH , urine protein electrophoresis -I did speak to the patient on 12/24/2022 and I have recommended Forteo, but the patient  absolutely does not want to give herself daily injections, the only other option left is Evenity which is monthly injections at the office, I did discuss the risk of CVA and CAD, but the patient feels strongly about not giving herself daily injections I would like to proceed with Evenity  Medications  Continue calcium 500 mg twice daily Continue Vitamin D 1000 iu daily  Stop Prolia Start Evenity monthly   Follow-up in 6 months    I spent 37 minutes preparing to see the patient by review of recent labs, imaging and procedures, obtaining and reviewing separately obtained history, communicating with the patient, ordering medications, tests or procedures, and documenting clinical information in the EHR including the differential Dx, treatment, and any further evaluation and other management   Signed electronically by: Lyndle Herrlich, MD  [facility]  [facility] [address] [place] [place], [place] [address] Phone: [phone] FAX: [phone]      CC: Tresa Garter, MD [address] [place] [address] Phone: [phone]  Fax: [phone]   Return to Endocrinology clinic as below: Future Appointments  Date Time Provider Department Center  12/26/2022  2:20 PM [name], [name], MD [facility] None  06/03/2023 11:20 AM Pricilla Riffle, MD [facility]  08/09/2023  2:40 PM Gwendalynn Eckstrom, Konrad Dolores, MD [facility] None

## 2022-12-14 NOTE — Patient Instructions (Signed)
-   Continue Calcium 600 mg tablets twice daily  - Continue Vitamin D 1000 iu daily

## 2022-12-15 LAB — TSH: TSH: 1.27 mIU/L (ref 0.40–4.50)

## 2022-12-17 LAB — ALBUMIN: Albumin: 4 g/dL (ref 3.6–5.1)

## 2022-12-17 LAB — T4, FREE: Free T4: 1.2 ng/dL (ref 0.8–1.8)

## 2022-12-17 LAB — PHOSPHORUS: Phosphorus: 3.5 mg/dL (ref 2.1–4.3)

## 2022-12-17 LAB — PTH, INTACT AND CALCIUM: PTH: 33 pg/mL (ref 16–77)

## 2022-12-18 LAB — PROTEIN ELECTROPHORESIS, SERUM, WITH REFLEX
Albumin ELP: 4.2 g/dL (ref 3.8–4.8)
Alpha 1: 0.3 g/dL (ref 0.2–0.3)
Alpha 2: 0.6 g/dL (ref 0.5–0.9)
Beta 2: 0.5 g/dL (ref 0.2–0.5)
Beta Globulin: 0.5 g/dL (ref 0.4–0.6)
Gamma Globulin: 1.3 g/dL (ref 0.8–1.7)
Total Protein: 7.4 g/dL (ref 6.1–8.1)

## 2022-12-18 LAB — MAGNESIUM: Magnesium: 2.3 mg/dL (ref 1.5–2.5)

## 2022-12-18 LAB — VITAMIN D 25 HYDROXY (VIT D DEFICIENCY, FRACTURES): Vit D, 25-Hydroxy: 58 ng/mL (ref 30–100)

## 2022-12-18 LAB — PTH, INTACT AND CALCIUM: Calcium: 9.4 mg/dL (ref 8.6–10.4)

## 2022-12-19 LAB — PROTEIN ELECTROPHORESIS, URINE REFLEX
Albumin ELP, Urine: 38.9 %
Alpha-1-Globulin, U: 1.9 %
Alpha-2-Globulin, U: 14.5 %
Beta Globulin, U: 19.2 %
Gamma Globulin, U: 25.5 %
Protein, Ur: 15.6 mg/dL

## 2022-12-24 ENCOUNTER — Telehealth: Payer: Self-pay | Admitting: Internal Medicine

## 2022-12-24 NOTE — Telephone Encounter (Signed)
Hi Brandy ,   Can you please add this patient to the Evenity list?  She will discontinue Prolia as it has not helped with bone density    Thank you

## 2022-12-25 NOTE — Telephone Encounter (Signed)
Evenity VOB initiated via [email or web address]  Last OV:  Next OV:  Last Evenity inj:  Next Evenity inj DUE:   

## 2022-12-26 ENCOUNTER — Ambulatory Visit (INDEPENDENT_AMBULATORY_CARE_PROVIDER_SITE_OTHER): Payer: Medicare Other | Admitting: Internal Medicine

## 2022-12-26 ENCOUNTER — Encounter: Payer: Self-pay | Admitting: Internal Medicine

## 2022-12-26 ENCOUNTER — Ambulatory Visit: Payer: Medicare Other | Admitting: Internal Medicine

## 2022-12-26 VITALS — BP 110/60 | HR 85 | Temp 97.8°F | Ht 60.5 in | Wt 97.0 lb

## 2022-12-26 DIAGNOSIS — I951 Orthostatic hypotension: Secondary | ICD-10-CM

## 2022-12-26 DIAGNOSIS — E559 Vitamin D deficiency, unspecified: Secondary | ICD-10-CM

## 2022-12-26 DIAGNOSIS — R634 Abnormal weight loss: Secondary | ICD-10-CM

## 2022-12-26 DIAGNOSIS — J4520 Mild intermittent asthma, uncomplicated: Secondary | ICD-10-CM | POA: Diagnosis not present

## 2022-12-26 DIAGNOSIS — F419 Anxiety disorder, unspecified: Secondary | ICD-10-CM

## 2022-12-26 DIAGNOSIS — K219 Gastro-esophageal reflux disease without esophagitis: Secondary | ICD-10-CM | POA: Diagnosis not present

## 2022-12-26 DIAGNOSIS — M810 Age-related osteoporosis without current pathological fracture: Secondary | ICD-10-CM

## 2022-12-26 NOTE — Assessment & Plan Note (Signed)
Chronic  Advair MDI - use every day prn

## 2022-12-26 NOTE — Assessment & Plan Note (Signed)
Hydrate well.  Try to gain more weight.  Continue with Florinef 

## 2022-12-26 NOTE — Patient Instructions (Addendum)
Evenity   Forteo

## 2022-12-26 NOTE — Assessment & Plan Note (Signed)
Occasional nausea after taking Pantoprazole - take w/food 

## 2022-12-26 NOTE — Assessment & Plan Note (Signed)
Pt was on Prolia - planning to have Evenity q 1 mo Pt declined Forteo option

## 2022-12-26 NOTE — Assessment & Plan Note (Signed)
Stable wt lately 

## 2022-12-26 NOTE — Progress Notes (Signed)
Subjective:  Patient ID: Cindy Meza, female    DOB: Mar 06, 1936  Age: 87 y.o. MRN: [id number]  CC: No chief complaint on file.   HPI Cindy Meza presents for B12 def, wt loss, orthostatic sx's. Pt was on Prolia - planning to have Evenity q 1 mo Pt declined Forteo option  Outpatient Medications Prior to Visit  Medication Sig Dispense Refill   aspirin 81 MG tablet Take 81 mg by mouth daily.     Calcium-Magnesium-Vitamin D (CALCIUM 500 PO) Take 1 tablet by mouth 2 (two) times daily with a meal. With lunch and supper     cholecalciferol (VITAMIN D3) 25 MCG (1000 UNIT) tablet Take 1,000 Units by mouth daily.     [employer] OMEGA-3 KRILL OIL 500 MG CAPS Take 500 capsules by mouth daily.     Cyanocobalamin (VITAMIN B-12) 1000 MCG SUBL Place 1 tablet (1,000 mcg total) under the tongue in the morning. 100 tablet 3   denosumab (PROLIA) 60 MG/ML SOSY injection Inject 60 mg into the skin every 6 (six) months.     estradiol (ESTRACE) 0.1 MG/GM vaginal cream Place vaginally.     fludrocortisone (FLORINEF) 0.1 MG tablet Take 0.5 tablets (0.05 mg total) by mouth 2 (two) times daily. At 8 am and 2 pm 180 tablet 3   fluticasone-salmeterol (ADVAIR HFA) 115-21 MCG/ACT inhaler Inhale 1-2 puffs into the lungs 2 (two) times daily. 12 g 11   loratadine (CLARITIN) 10 MG tablet Take 10 mg by mouth daily.  prn     pantoprazole (PROTONIX) 40 MG tablet TAKE 1 TABLET(40 MG) BY MOUTH DAILY 90 tablet 3   polyethylene glycol (MIRALAX / GLYCOLAX) 17 g packet Take 17 g by mouth daily.     Wheat Dextrin (BENEFIBER DRINK MIX PO) Take 1 Capful by mouth daily.     No facility-administered medications prior to visit.    ROS: Review of Systems  Constitutional:  Negative for activity change, appetite change, chills, fatigue and unexpected weight change.  HENT:  Negative for congestion, mouth sores and sinus pressure.   Eyes:  Negative for visual disturbance.  Respiratory:  Negative for cough and chest tightness.    Gastrointestinal:  Negative for abdominal pain and nausea.  Genitourinary:  Negative for difficulty urinating, frequency and vaginal pain.  Musculoskeletal:  Negative for back pain and gait problem.  Skin:  Negative for pallor and rash.  Neurological:  Positive for dizziness and light-headedness. Negative for tremors, weakness, numbness and headaches.  Psychiatric/Behavioral:  Negative for confusion, sleep disturbance and suicidal ideas. The patient is nervous/anxious.     Objective:  BP 110/60 (BP Location: Left Arm, Patient Position: Sitting, Cuff Size: Large)   Pulse 85   Temp 97.8 F (36.6 C) (Oral)   Ht 5' 0.5" (1.537 m)   Wt 97 lb (44 kg)   SpO2 97%   BMI 18.63 kg/m   BP Readings from Last 3 Encounters:  12/26/22 110/60  12/14/22 120/86  12/07/22 118/60    Wt Readings from Last 3 Encounters:  12/26/22 97 lb (44 kg)  12/14/22 97 lb (44 kg)  12/07/22 97 lb (44 kg)    Physical Exam Constitutional:      General: She is not in acute distress.    Appearance: Normal appearance. She is well-developed.  HENT:     Head: Normocephalic.     Right Ear: External ear normal.     Left Ear: External ear normal.     Nose: Nose normal.  Eyes:     General:        Right eye: No discharge.        Left eye: No discharge.     Conjunctiva/sclera: Conjunctivae normal.     Pupils: Pupils are equal, round, and reactive to light.  Neck:     Thyroid: No thyromegaly.     Vascular: No JVD.     Trachea: No tracheal deviation.  Cardiovascular:     Rate and Rhythm: Normal rate and regular rhythm.     Heart sounds: Normal heart sounds.  Pulmonary:     Effort: No respiratory distress.     Breath sounds: No stridor. No wheezing.  Abdominal:     General: Bowel sounds are normal. There is no distension.     Palpations: Abdomen is soft. There is no mass.     Tenderness: There is no abdominal tenderness. There is no guarding or rebound.  Musculoskeletal:        General: No tenderness.      Cervical back: Normal range of motion and neck supple. No rigidity.  Lymphadenopathy:     Cervical: No cervical adenopathy.  Skin:    Findings: No erythema or rash.  Neurological:     Cranial Nerves: No cranial nerve deficit.     Motor: No abnormal muscle tone.     Coordination: Coordination normal.     Deep Tendon Reflexes: Reflexes normal.  Psychiatric:        Behavior: Behavior normal.        Thought Content: Thought content normal.        Judgment: Judgment normal.     Lab Results  Component Value Date   WBC 6.1 09/27/2022   HGB 12.3 09/27/2022   HCT 37.3 09/27/2022   PLT 269.0 09/27/2022   GLUCOSE 90 09/27/2022   CHOL 227 (H) 07/12/2021   TRIG 45 07/12/2021   HDL 107 07/12/2021   LDLDIRECT 104.0 03/17/2013   LDLCALC 112 (H) 07/12/2021   ALT 12 09/27/2022   AST 17 09/27/2022   NA 137 09/27/2022   K 4.2 09/27/2022   CL 102 09/27/2022   CREATININE 1.21 (H) 09/27/2022   BUN 22 09/27/2022   CO2 31 09/27/2022   TSH 1.27 12/14/2022   INR 1.04 06/12/2016   HGBA1C 5.0 12/27/2021    MR LUMBAR SPINE WO CONTRAST  Result Date: 09/25/2022 CLINICAL DATA:  Hyperreflexia, leg weakness. Low back pain radiating into right buttocks/leg. EXAM: MRI LUMBAR SPINE WITHOUT CONTRAST TECHNIQUE: Multiplanar, multisequence MR imaging of the lumbar spine was performed. No intravenous contrast was administered. COMPARISON:  Lumbar spine radiographs 07/20/2020. FINDINGS: Segmentation: Conventional numbering is assumed with 5 non-rib-bearing, lumbar type vertebral bodies. Alignment:  3 mm anterolisthesis of L5 on S1. Vertebrae:  Normal vertebral body heights and marrow signal. Conus medullaris and cauda equina: Conus extends to the L1 level. Conus and cauda equina appear normal. Paraspinal and other soft tissues: Unremarkable. Disc levels: T12-L1:  Unremarkable. L1-L2:  Unremarkable. L2-L3: Disc bulge and facet arthropathy results in mild bilateral neural foraminal narrowing. No significant spinal  canal stenosis. L3-L4: Right eccentric disc bulge with displacement of the traversing right L4 nerve root in the lateral recess. Mild bilateral neural foraminal narrowing. L4-L5: Right eccentric disc bulge and facet arthropathy results in compression of the traversing right L5 nerve root in the lateral recess. Mild bilateral neural foraminal narrowing. L5-S1: Disc bulge, anterolisthesis and facet arthropathy results in mild bilateral neural foraminal narrowing. No spinal canal stenosis. IMPRESSION: 1. Right  eccentric disc bulges at L3-L4 and L4-L5 result in displacement of the traversing right L4 nerve root and compression of the traversing right L5 nerve root in the lateral recess. 2. No high-grade spinal canal stenosis. Electronically Signed   By: Orvan Falconer M.D.   On: 09/25/2022 16:32    Assessment & Plan:   Problem List Items Addressed This Visit     Anxiety    Not taking Celexa      Asthma    Chronic  Advair MDI - use every day prn      GERD (gastroesophageal reflux disease)    Occasional nausea after taking Pantoprazole - take w/food      Orthostatic hypotension - Primary    Hydrate well.  Try to gain more weight.  Continue with Florinef      Osteoporosis    Pt was on Prolia - planning to have Evenity q 1 mo Pt declined Forteo option      Vitamin D deficiency    Start Vit D3+K2      Weight loss    Stable wt lately         No orders of the defined types were placed in this encounter.     Follow-up: Return in about 3 months (around 03/28/2023) for a follow-up visit.  Sonda Primes, MD

## 2022-12-26 NOTE — Telephone Encounter (Signed)
Prior Auth required for EVENITY  PA PROCESS DETAILS: Effective 07/09/2021 if the patient is new to [facility], Prior authorization and Step Therapy are required & not on file. Please go to [email or web address] or call [phone] to initiate the prior authorization. For exception to the policy please visit [email or web address] and review Policy Number [email or web address]

## 2022-12-26 NOTE — Assessment & Plan Note (Signed)
Not taking Celexa

## 2022-12-26 NOTE — Assessment & Plan Note (Signed)
Start Vit D3+K2 

## 2023-01-06 NOTE — Telephone Encounter (Signed)
Prior Authorization initiated for EVENITY via [facility] Provider portal.   Status: PENDING

## 2023-01-14 ENCOUNTER — Telehealth: Payer: Self-pay | Admitting: Internal Medicine

## 2023-01-14 NOTE — Telephone Encounter (Signed)
Prior auth for [facility] APPROVED  PA# [id number] Valid: 01/06/23-01/06/24

## 2023-01-14 NOTE — Telephone Encounter (Signed)
Pt ready for scheduling on or after 01/14/23  Out-of-pocket cost due at time of visit: $0  Primary: UHC Medicare Adv LPPO Evenity co-insurance: 0% Admin fee co-insurance: 0%  Deductible: $200 of $200 met  Prior Auth APPROVED PA# [id number] Valid: 01/06/23-01/06/24  Secondary: N/A Evenity co-insurance:  Admin fee co-insurance:  Deductible:  Prior Auth:  PA# Valid:    ** This summary of benefits is an estimation of the patient's out-of-pocket cost. Exact cost may vary based on individual plan coverage.

## 2023-01-14 NOTE — Telephone Encounter (Signed)
Patient was taking Prolia and now is changing to [place], she would like to speak to someone about is it safe to get injection now or shoud she wait for a while after last Prolia injection.. Please advise

## 2023-01-14 NOTE — Telephone Encounter (Signed)
Patient has been advised and will contact us back around September to schedule for her Evenity in October.

## 2023-01-26 NOTE — Telephone Encounter (Signed)
Lisabeth Pick, CMA      01/14/23  1:24 PM Note Patient has been advised and will contact us back around September to schedule for her Evenity in October.      Shamleffer, Konrad Dolores, MD  to [facility] Clinical [facility]     01/14/23 11:03 AM The latest she can have it is in October.  She took Prolia in May which will last her 6 months, so she needs to take the Evenity before the 6 months of Prolia are up .  If she wants to postpone till October that is fine

## 2023-03-27 ENCOUNTER — Encounter: Payer: Self-pay | Admitting: Internal Medicine

## 2023-03-27 ENCOUNTER — Ambulatory Visit (INDEPENDENT_AMBULATORY_CARE_PROVIDER_SITE_OTHER): Payer: Medicare Other | Admitting: Internal Medicine

## 2023-03-27 VITALS — BP 110/70 | HR 83 | Temp 98.6°F | Ht 60.5 in | Wt 101.0 lb

## 2023-03-27 DIAGNOSIS — K219 Gastro-esophageal reflux disease without esophagitis: Secondary | ICD-10-CM | POA: Diagnosis not present

## 2023-03-27 DIAGNOSIS — G4700 Insomnia, unspecified: Secondary | ICD-10-CM | POA: Insufficient documentation

## 2023-03-27 DIAGNOSIS — Z23 Encounter for immunization: Secondary | ICD-10-CM

## 2023-03-27 DIAGNOSIS — N3281 Overactive bladder: Secondary | ICD-10-CM

## 2023-03-27 DIAGNOSIS — R634 Abnormal weight loss: Secondary | ICD-10-CM

## 2023-03-27 LAB — COMPREHENSIVE METABOLIC PANEL WITH GFR
ALT: 12 U/L (ref 0–35)
AST: 19 U/L (ref 0–37)
Albumin: 4 g/dL (ref 3.5–5.2)
Alkaline Phosphatase: 34 U/L — ABNORMAL LOW (ref 39–117)
BUN: 17 mg/dL (ref 6–23)
CO2: 30 meq/L (ref 19–32)
Calcium: 9.3 mg/dL (ref 8.4–10.5)
Chloride: 102 meq/L (ref 96–112)
Creatinine, Ser: 1.1 mg/dL (ref 0.40–1.20)
GFR: 45.93 mL/min — ABNORMAL LOW (ref >60.00)
Glucose, Bld: 92 mg/dL (ref 70–99)
Potassium: 3.7 meq/L (ref 3.5–5.1)
Sodium: 140 meq/L (ref 135–145)
Total Bilirubin: 0.5 mg/dL (ref 0.2–1.2)
Total Protein: 7.3 g/dL (ref 6.0–8.3)

## 2023-03-27 LAB — URINALYSIS
Bilirubin Urine: NEGATIVE
Hgb urine dipstick: NEGATIVE
Leukocytes,Ua: NEGATIVE
Nitrite: NEGATIVE
Specific Gravity, Urine: 1.015 (ref 1.000–1.030)
Total Protein, Urine: NEGATIVE
Urine Glucose: NEGATIVE
Urobilinogen, UA: 0.2 (ref 0.0–1.0)
pH: 6 (ref 5.0–8.0)

## 2023-03-27 LAB — CBC WITH DIFFERENTIAL/PLATELET
Basophils Absolute: 0 10*3/uL (ref 0.0–0.1)
Basophils Relative: 0.5 % (ref 0.0–3.0)
Eosinophils Absolute: 0.1 10*3/uL (ref 0.0–0.7)
Eosinophils Relative: 2.1 % (ref 0.0–5.0)
HCT: 35.7 % — ABNORMAL LOW (ref 36.0–46.0)
Hemoglobin: 11.8 g/dL — ABNORMAL LOW (ref 12.0–15.0)
Lymphocytes Relative: 37.6 % (ref 12.0–46.0)
Lymphs Abs: 2.2 10*3/uL (ref 0.7–4.0)
MCHC: 33.1 g/dL (ref 30.0–36.0)
MCV: 94.5 fl (ref 78.0–100.0)
Monocytes Absolute: 0.4 10*3/uL (ref 0.1–1.0)
Monocytes Relative: 7.2 % (ref 3.0–12.0)
Neutro Abs: 3.1 10*3/uL (ref 1.4–7.7)
Neutrophils Relative %: 52.6 % (ref 43.0–77.0)
Platelets: 209 10*3/uL (ref 150.0–400.0)
RBC: 3.78 Mil/uL — ABNORMAL LOW (ref 3.87–5.11)
RDW: 13.1 % (ref 11.5–15.5)
WBC: 5.9 10*3/uL (ref 4.0–10.5)

## 2023-03-27 MED ORDER — PANTOPRAZOLE SODIUM 20 MG PO TBEC
20.0000 mg | DELAYED_RELEASE_TABLET | Freq: Every day | ORAL | 11 refills | Status: DC
Start: 2023-03-27 — End: 2023-06-13

## 2023-03-27 MED ORDER — LORAZEPAM 0.5 MG PO TABS: 0.2500 mg | ORAL_TABLET | Freq: Every evening | ORAL | 2 refills | Status: AC | PRN

## 2023-03-27 NOTE — Progress Notes (Signed)
Subjective:  Patient ID: Cindy Meza, female    DOB: Aug 19, 1935  Age: 87 y.o. MRN: [id number]  CC: Follow-up (3 mnth f/u, discuss frequency in urination)   HPI Cindy Meza presents for wt loss, OAB, insomnia - waking up 4-6/night to pee  Outpatient Medications Prior to Visit  Medication Sig Dispense Refill   aspirin 81 MG tablet Take 81 mg by mouth daily.     Calcium-Magnesium-Vitamin D (CALCIUM 500 PO) Take 1 tablet by mouth 2 (two) times daily with a meal. With lunch and supper     cholecalciferol (VITAMIN D3) 25 MCG (1000 UNIT) tablet Take 1,000 Units by mouth daily.     [employer]S OMEGA-3 KRILL OIL 500 MG CAPS Take 500 capsules by mouth daily.     Cyanocobalamin (VITAMIN B-12) 1000 MCG SUBL Place 1 tablet (1,000 mcg total) under the tongue in the morning. 100 tablet 3   denosumab (PROLIA) 60 MG/ML SOSY injection Inject 60 mg into the skin every 6 (six) months.     estradiol (ESTRACE) 0.1 MG/GM vaginal cream Place vaginally.     fludrocortisone (FLORINEF) 0.1 MG tablet Take 0.5 tablets (0.05 mg total) by mouth 2 (two) times daily. At 8 am and 2 pm 180 tablet 3   fluticasone-salmeterol (ADVAIR HFA) 115-21 MCG/ACT inhaler Inhale 1-2 puffs into the lungs 2 (two) times daily. 12 g 11   loratadine (CLARITIN) 10 MG tablet Take 10 mg by mouth daily.  prn     polyethylene glycol (MIRALAX / GLYCOLAX) 17 g packet Take 17 g by mouth daily.     Wheat Dextrin (BENEFIBER DRINK MIX PO) Take 1 Capful by mouth daily.     pantoprazole (PROTONIX) 40 MG tablet TAKE 1 TABLET(40 MG) BY MOUTH DAILY 90 tablet 3   No facility-administered medications prior to visit.    ROS: Review of Systems  Constitutional:  Positive for unexpected weight change. Negative for activity change, appetite change, chills and fatigue.  HENT:  Negative for congestion, mouth sores and sinus pressure.   Eyes:  Negative for visual disturbance.  Respiratory:  Negative for cough and chest tightness.   Gastrointestinal:   Negative for abdominal pain and nausea.  Genitourinary:  Positive for frequency. Negative for difficulty urinating and vaginal pain.  Musculoskeletal:  Negative for back pain.  Skin:  Negative for pallor and rash.  Neurological:  Negative for dizziness, tremors, weakness, numbness and headaches.  Psychiatric/Behavioral:  Positive for sleep disturbance. Negative for confusion. The patient is nervous/anxious.     Objective:  BP 110/70 (BP Location: Left Arm, Patient Position: Sitting, Cuff Size: Large)   Pulse 83   Temp 98.6 F (37 C) (Oral)   Ht 5' 0.5" (1.537 m)   Wt 101 lb (45.8 kg)   SpO2 94%   BMI 19.40 kg/m   BP Readings from Last 3 Encounters:  03/27/23 110/70  12/26/22 110/60  12/14/22 120/86    Wt Readings from Last 3 Encounters:  03/27/23 101 lb (45.8 kg)  12/26/22 97 lb (44 kg)  12/14/22 97 lb (44 kg)    Physical Exam Constitutional:      General: She is not in acute distress.    Appearance: She is well-developed.  HENT:     Head: Normocephalic.     Right Ear: External ear normal.     Left Ear: External ear normal.     Nose: Nose normal.  Eyes:     General:        Right eye:  No discharge.        Left eye: No discharge.     Conjunctiva/sclera: Conjunctivae normal.     Pupils: Pupils are equal, round, and reactive to light.  Neck:     Thyroid: No thyromegaly.     Vascular: No JVD.     Trachea: No tracheal deviation.  Cardiovascular:     Rate and Rhythm: Normal rate and regular rhythm.     Heart sounds: Normal heart sounds.  Pulmonary:     Effort: No respiratory distress.     Breath sounds: No stridor. No wheezing.  Abdominal:     General: Bowel sounds are normal. There is no distension.     Palpations: Abdomen is soft. There is no mass.     Tenderness: There is no abdominal tenderness. There is no guarding or rebound.  Musculoskeletal:        General: No tenderness.     Cervical back: Normal range of motion and neck supple. No rigidity.   Lymphadenopathy:     Cervical: No cervical adenopathy.  Skin:    Findings: No erythema or rash.  Neurological:     Cranial Nerves: No cranial nerve deficit.     Motor: No abnormal muscle tone.     Coordination: Coordination normal.     Deep Tendon Reflexes: Reflexes normal.  Psychiatric:        Behavior: Behavior normal.        Thought Content: Thought content normal.        Judgment: Judgment normal.     Lab Results  Component Value Date   WBC 6.1 09/27/2022   HGB 12.3 09/27/2022   HCT 37.3 09/27/2022   PLT 269.0 09/27/2022   GLUCOSE 90 09/27/2022   CHOL 227 (H) 07/12/2021   TRIG 45 07/12/2021   HDL 107 07/12/2021   LDLDIRECT 104.0 03/17/2013   LDLCALC 112 (H) 07/12/2021   ALT 12 09/27/2022   AST 17 09/27/2022   NA 137 09/27/2022   K 4.2 09/27/2022   CL 102 09/27/2022   CREATININE 1.21 (H) 09/27/2022   BUN 22 09/27/2022   CO2 31 09/27/2022   TSH 1.27 12/14/2022   INR 1.04 06/12/2016   HGBA1C 5.0 12/27/2021    MR LUMBAR SPINE WO CONTRAST  Result Date: 09/25/2022 CLINICAL DATA:  Hyperreflexia, leg weakness. Low back pain radiating into right buttocks/leg. EXAM: MRI LUMBAR SPINE WITHOUT CONTRAST TECHNIQUE: Multiplanar, multisequence MR imaging of the lumbar spine was performed. No intravenous contrast was administered. COMPARISON:  Lumbar spine radiographs 07/20/2020. FINDINGS: Segmentation: Conventional numbering is assumed with 5 non-rib-bearing, lumbar type vertebral bodies. Alignment:  3 mm anterolisthesis of L5 on S1. Vertebrae:  Normal vertebral body heights and marrow signal. Conus medullaris and cauda equina: Conus extends to the L1 level. Conus and cauda equina appear normal. Paraspinal and other soft tissues: Unremarkable. Disc levels: T12-L1:  Unremarkable. L1-L2:  Unremarkable. L2-L3: Disc bulge and facet arthropathy results in mild bilateral neural foraminal narrowing. No significant spinal canal stenosis. L3-L4: Right eccentric disc bulge with displacement of  the traversing right L4 nerve root in the lateral recess. Mild bilateral neural foraminal narrowing. L4-L5: Right eccentric disc bulge and facet arthropathy results in compression of the traversing right L5 nerve root in the lateral recess. Mild bilateral neural foraminal narrowing. L5-S1: Disc bulge, anterolisthesis and facet arthropathy results in mild bilateral neural foraminal narrowing. No spinal canal stenosis. IMPRESSION: 1. Right eccentric disc bulges at L3-L4 and L4-L5 result in displacement of the traversing right L4  nerve root and compression of the traversing right L5 nerve root in the lateral recess. 2. No high-grade spinal canal stenosis. Electronically Signed   By: Orvan Falconer M.D.   On: 09/25/2022 16:32    Assessment & Plan:   Problem List Items Addressed This Visit     GERD (gastroesophageal reflux disease)    We reduced Pantoprazole to 20 mg/d per Raymie's request - take w/food      Relevant Medications   pantoprazole (PROTONIX) 20 MG tablet   Other Relevant Orders   CBC with Differential/Platelet   Urinalysis   Weight loss - Primary    Stable wt lately - better  Wt Readings from Last 3 Encounters:  03/27/23 101 lb (45.8 kg)  12/26/22 97 lb (44 kg)  12/14/22 97 lb (44 kg)         Relevant Orders   Comprehensive metabolic panel   CBC with Differential/Platelet   Urinalysis   Insomnia    Chronic plus OAB sx's Options discussed Lorazepam prn at hs - low dose      OAB (overactive bladder)     OAB sx's Options discussed Lorazepam prn at hs - low dose      Relevant Orders   Urinalysis   Other Visit Diagnoses     Need for influenza vaccination       Relevant Orders   Flu Vaccine Trivalent High Dose (Fluad) (Completed)         Meds ordered this encounter  Medications   LORazepam (ATIVAN) 0.5 MG tablet    Sig: Take 0.5-1 tablets (0.25-0.5 mg total) by mouth at bedtime as needed for anxiety (insomnia, overactive bladder).    Dispense:  30 tablet     Refill:  2   pantoprazole (PROTONIX) 20 MG tablet    Sig: Take 1 tablet (20 mg total) by mouth daily.    Dispense:  30 tablet    Refill:  11      Follow-up: No follow-ups on file.  Sonda Primes, MD

## 2023-03-27 NOTE — Assessment & Plan Note (Signed)
OAB sx's Options discussed Lorazepam prn at hs - low dose

## 2023-03-27 NOTE — Assessment & Plan Note (Signed)
Stable wt lately - better  Wt Readings from Last 3 Encounters:  03/27/23 101 lb (45.8 kg)  12/26/22 97 lb (44 kg)  12/14/22 97 lb (44 kg)

## 2023-03-27 NOTE — Assessment & Plan Note (Signed)
We reduced Pantoprazole to 20 mg/d per Emmanuel's request - take w/food

## 2023-03-27 NOTE — Assessment & Plan Note (Signed)
Chronic plus OAB sx's Options discussed Lorazepam prn at hs - low dose

## 2023-04-18 ENCOUNTER — Telehealth: Payer: Self-pay

## 2023-04-18 NOTE — Telephone Encounter (Signed)
Contact patient to schedule her injection

## 2023-04-18 NOTE — Telephone Encounter (Signed)
Patient is scheduled for Tuesday 04/23/2023 for Evenity injection.

## 2023-04-23 ENCOUNTER — Ambulatory Visit: Payer: Medicare Other

## 2023-04-23 VITALS — BP 120/70 | HR 66 | Ht 60.5 in | Wt 102.0 lb

## 2023-04-23 DIAGNOSIS — M810 Age-related osteoporosis without current pathological fracture: Secondary | ICD-10-CM

## 2023-04-23 MED ORDER — ROMOSOZUMAB-AQQG 105 MG/1.17ML ~~LOC~~ SOSY
210.0000 mg | PREFILLED_SYRINGE | Freq: Once | SUBCUTANEOUS | Status: AC
  Administered 2023-04-23: 210 mg via SUBCUTANEOUS

## 2023-04-23 NOTE — Progress Notes (Signed)
After obtaining consent, and per orders of Dr. Lonzo Cloud, injection of Evenity 210mg    given by Debbra Digiulio L Consuello Lassalle in left arm SQ. Patient instructed to remain in clinic for 20 minutes afterwards, and to report any adverse reaction to me immediately.

## 2023-04-23 NOTE — Telephone Encounter (Signed)
Evenity:  Prior [employer] - PA# [id number] - Valid: 01/06/23-01/06/24  Evenity #1 - 04/23/2023 Evenity #2 - scheduled 05/29/2023

## 2023-05-07 NOTE — Telephone Encounter (Signed)
Prolia VOB initiated via [email or web address]  Next Prolia inj DUE: 06/03/23

## 2023-05-15 LAB — HM MAMMOGRAPHY

## 2023-05-29 ENCOUNTER — Ambulatory Visit: Payer: Medicare Other

## 2023-05-29 DIAGNOSIS — M810 Age-related osteoporosis without current pathological fracture: Secondary | ICD-10-CM

## 2023-05-29 MED ORDER — ROMOSOZUMAB-AQQG 105 MG/1.17ML ~~LOC~~ SOSY
210.0000 mg | PREFILLED_SYRINGE | Freq: Once | SUBCUTANEOUS | Status: AC
Start: 2023-05-29 — End: 2023-05-29
  Administered 2023-05-29: 210 mg via SUBCUTANEOUS

## 2023-05-29 MED ORDER — ROMOSOZUMAB-AQQG 105 MG/1.17ML ~~LOC~~ SOSY
210.0000 mg | PREFILLED_SYRINGE | Freq: Once | SUBCUTANEOUS | Status: AC
Start: 2023-06-28 — End: 2023-07-05
  Administered 2023-07-05: 210 mg via SUBCUTANEOUS

## 2023-05-29 NOTE — Progress Notes (Signed)
After obtaining consent, and per orders of Dr. Lonzo Cloud, injection of Evenity given by Pollie Meyer. Patient instructed to remain in clinic for 20 minutes afterwards, and to report any adverse reaction to me immediately.

## 2023-05-29 NOTE — Telephone Encounter (Signed)
Prior Auth APPROVED PA# [id number] Valid: 11/23/22-11/23/23

## 2023-05-29 NOTE — Telephone Encounter (Signed)
Evenity:  Prior [employer] - PA# [id number] - Valid: 01/06/23-01/06/24   Evenity #1 - 04/23/2023 Evenity #2 - 05/29/2023  Evenity #2 - scheduled for 07/05/23

## 2023-06-01 NOTE — Telephone Encounter (Addendum)
Evenity Injection Schedule Inj #1 - 04/23/23 Inj #2 - 05/29/23 Inj #3 - 07/05/23  Re-run benefits for 2025 - VOB initiated  Inj #4 - scheduled 08/06/23 Inj #5  Inj #6  Inj #7  Inj #8  Inj #9  Inj #10  Inj #11  Inj #12    Prior Auth APPROVED PA# [id number] Valid: 01/06/23-01/06/24

## 2023-06-01 NOTE — Telephone Encounter (Signed)
Pt ready for scheduling on or after 06/03/23  Out-of-pocket cost due at time of visit: $0  Primary: UHC Medicare Adv LPPO Prolia co-insurance: 0% Admin fee co-insurance: 0%  Deductible: $200 of $200 met  Prior Auth APPROVED PA# [id number] Valid: 11/23/22-11/23/23   Secondary: N/A Prolia co-insurance:  Admin fee co-insurance:  Deductible:  Prior Auth:  PA# Valid:     ** This summary of benefits is an estimation of the patient's out-of-pocket cost. Exact cost may very based on individual plan coverage.

## 2023-06-01 NOTE — Telephone Encounter (Signed)
Prior Authorization REQUIRED for PROLIA  PA PROCESS DETAILS: Effective 07/09/2021 if the patient is new to [facility], Prior  authorization and Step Therapy are required & not on file. Please go to  [email or web address]  or call [phone] to initiate the prior authorization.   For exception to the policy please visit  [email or web address] and review Policy  Number [email or web address]  Primary: [employer] Medicare Adv LPPO  COVERAGE DETAILS: Prolia and administration are covered at 100% after a $200.00 deductible ($200.00  met). The benefits provided on this Verification of Benefits form are Medical Benefits and are the patient's  In-Network benefits for Prolia. If you would like Pharmacy Benefits for Prolia, please call [phone].

## 2023-06-01 NOTE — Telephone Encounter (Signed)
Prior Auth on file and valid  Prior Auth APPROVED PA# [id number] Valid: 11/23/22-11/23/23

## 2023-06-02 NOTE — Progress Notes (Unsigned)
Cardiology Office Note   Date:  06/03/2023   ID:  Cindy Meza, DOB 05/08/1936, MRN [id number]  PCP:  Tresa Garter, MD  Cardiologist:   Dietrich Pates, MD   Pt presents for f/u of orthostatic hypotension    History of Present Illness: Cindy Meza is a 87 y.o. female with a history  fatigue, orthostatic intolerance, mitral regurgitation, asthma, GERD  CT of abdomen showed aortic atherosclerosis  Last myoview in 2019 showed no ischemia EF down but previous OK     Echo in 2019 LVEF 50 to 55% with mild diastolic dysfunction and mod MR   She did not tolerate rosuvastatin (constipation)    Sensitive to several meds   I saw the pt in May 2024  Since seen she continues florinef She denies CP  Breathing is good   NO palpitations  Since seen she says she has not been bothered by dizziness Has some ankle swelling, worse at night  OK in am Has some nausea  Does not get every day   Outpatient Medications Prior to Visit  Medication Sig Dispense Refill   aspirin 81 MG tablet Take 81 mg by mouth daily.     Calcium-Magnesium-Vitamin D (CALCIUM 500 PO) Take 1 tablet by mouth 2 (two) times daily with a meal. With lunch and supper     cholecalciferol (VITAMIN D3) 25 MCG (1000 UNIT) tablet Take 1,000 Units by mouth daily.     [employer] OMEGA-3 KRILL OIL 500 MG CAPS Take 500 capsules by mouth daily.     Cyanocobalamin (VITAMIN B-12) 1000 MCG SUBL Place 1 tablet (1,000 mcg total) under the tongue in the morning. 100 tablet 3   estradiol (ESTRACE) 0.1 MG/GM vaginal cream Place vaginally.     fludrocortisone (FLORINEF) 0.1 MG tablet Take 0.5 tablets (0.05 mg total) by mouth 2 (two) times daily. At 8 am and 2 pm 180 tablet 3   fluticasone-salmeterol (ADVAIR HFA) 115-21 MCG/ACT inhaler Inhale 1-2 puffs into the lungs 2 (two) times daily. 12 g 11   LORazepam (ATIVAN) 0.5 MG tablet Take 0.5-1 tablets (0.25-0.5 mg total) by mouth at bedtime as needed for anxiety (insomnia, overactive bladder). 30  tablet 2   pantoprazole (PROTONIX) 20 MG tablet Take 1 tablet (20 mg total) by mouth daily. 30 tablet 11   polyethylene glycol (MIRALAX / GLYCOLAX) 17 g packet Take 17 g by mouth daily.     Romosozumab-aqqg (EVENITY) 105 MG/1. SOSY injection Inject 210 mg into the skin every 30 (thirty) days.     Wheat Dextrin (BENEFIBER DRINK MIX PO) Take 1 Capful by mouth daily.     denosumab (PROLIA) 60 MG/ML SOSY injection Inject 60 mg into the skin every 6 (six) months. (Patient not taking: Reported on 06/03/2023)     loratadine (CLARITIN) 10 MG tablet Take 10 mg by mouth daily.  prn     Facility-Administered Medications Prior to Visit  Medication Dose Route Frequency Provider Last Rate Last Admin   [START ON 06/28/2023] Romosozumab-aqqg (EVENITY) 105 MG/1. injection 210 mg  210 mg Subcutaneous Once Shamleffer, Konrad Dolores, MD         Allergies:   Fosamax [alendronate sodium], Gabapentin, Lidocaine, Midodrine, Risedronate sodium, Shellfish allergy, and Statins   Past Medical History:  Diagnosis Date   Allergy    Anemia    Asthma    Dr. Sherene Sires   Blood transfusion without reported diagnosis    Breast cyst    Left   Cataract  Dr. Nile Riggs   Complication of anesthesia    ' I HAD HALLUCINATIONS WHEN THEY DID MY HIP "   GERD (gastroesophageal reflux disease)    Heart murmur    Hemorrhoids    History of colonic polyps Gessner   hx of adenomas (small) 1998   OA (osteoarthritis)    Orthostatic hypotension    Osteopenia    PAC (premature atrial contraction)    Vitamin D deficiency     Past Surgical History:  Procedure Laterality Date   ABDOMINAL HYSTERECTOMY     complete   APPENDECTOMY     BREAST CYST EXCISION     CATARACT EXTRACTION, BILATERAL     04/23/18 left eye, 04/02/18 right eye   COLONOSCOPY  Multiple   Adenomatous colon polyps   ESOPHAGOGASTRODUODENOSCOPY  2010   INTRAMEDULLARY (IM) NAIL INTERTROCHANTERIC Right 06/13/2016   Procedure: INTRAMEDULLARY (IM) NAIL  INTERTROCHANTRIC;  Surgeon: Jones Broom, MD;  Location: [facility] OR;  Service: Orthopedics;  Laterality: Right;   TONSILLECTOMY       Social History:  The patient  reports that she has never smoked. She has never used smokeless tobacco. She reports that she does not currently use alcohol. She reports that she does not use drugs.   Family History:  The patient's family history includes Diabetes (age of onset: 38) in her mother; Hypertension in an other family member.  No history of CAD known    Mother had PPM    ROS:  Please see the history of present illness. All other systems are reviewed and  Negative to the above problem except as noted.    PHYSICAL EXAM: VS:  BP 120/62   Pulse 89   Ht 5\' 1"  (1.549 m)   Wt 103 lb (46.7 kg)   SpO2 95%   BMI 19.46 kg/m    GEN: Thin 87 year old in no acute distress  HEENT: normal  Neck: JVP is normal  No carotid bruits Cardiac: RRR; no murmur   No LE  edema  Wearing support socks Respiratory:  clear to auscultation  GI: soft, no masss No hepatomegaly    EKG:  EKG is not ordered  Lipid Panel    Component Value Date/Time   CHOL 227 (H) 07/12/2021 1222   TRIG 45 07/12/2021 1222   HDL 107 07/12/2021 1222   CHOLHDL 2.1 07/12/2021 1222   CHOLHDL 2 04/10/2018 1207   VLDL 13.6 04/10/2018 1207   LDLCALC 112 (H) 07/12/2021 1222   LDLDIRECT 104.0 03/17/2013 1026      Wt Readings from Last 3 Encounters:  06/03/23 103 lb (46.7 kg)  04/23/23 102 lb (46.3 kg)  03/27/23 101 lb (45.8 kg)      ASSESSMENT AND PLAN:  1  Orthostatic hypotension  Overall doing good  I encouraged her to keep on florinef   K is OK   No edema on exam today Stay hydrated  Stay active   2  CAD Plaquing of aorta and coronary arteries  Normal myovue in Dec 2019  She remains asymptomatic    Keep on ASA  She did not tolerate statin.  Very senstive to meds    3 Hyperlipidemia LDL  112 in 2023   Intolerant to statins  Follow  She will get labs in December   I will see in  6 months    Current medicines are reviewed at length with the patient today.  The patient does not have concerns regarding medicines.   Signed, Dietrich Pates, MD  06/03/2023  12:23 PM    [facility] [address] [place], [place], [place]  [address] Phone: [phone]; Fax: [phone]

## 2023-06-03 ENCOUNTER — Ambulatory Visit: Payer: Medicare Other | Attending: Internal Medicine | Admitting: Internal Medicine

## 2023-06-03 ENCOUNTER — Telehealth: Payer: Self-pay

## 2023-06-03 ENCOUNTER — Encounter: Payer: Self-pay | Admitting: Internal Medicine

## 2023-06-03 VITALS — BP 120/62 | HR 89 | Ht 61.0 in | Wt 103.0 lb

## 2023-06-03 DIAGNOSIS — E785 Hyperlipidemia, unspecified: Secondary | ICD-10-CM | POA: Diagnosis not present

## 2023-06-03 DIAGNOSIS — R0609 Other forms of dyspnea: Secondary | ICD-10-CM | POA: Diagnosis not present

## 2023-06-03 DIAGNOSIS — I2510 Atherosclerotic heart disease of native coronary artery without angina pectoris: Secondary | ICD-10-CM | POA: Diagnosis not present

## 2023-06-03 DIAGNOSIS — I351 Nonrheumatic aortic (valve) insufficiency: Secondary | ICD-10-CM | POA: Diagnosis not present

## 2023-06-03 NOTE — Patient Instructions (Signed)
Medication Instructions:   *If you need a refill on your cardiac medications before your next appointment, please call your pharmacy*   Lab Work: NMR WITH DR [name] If you have labs (blood work) drawn today and your tests are completely normal, you will receive your results only by: MyChart Message (if you have MyChart) OR A paper copy in the mail If you have any lab test that is abnormal or we need to change your treatment, we will call you to review the results.   Testing/Procedures:    Follow-Up: At [facility], you and your health needs are our priority.  As part of our continuing mission to provide you with exceptional heart care, we have created designated Provider Care Teams.  These Care Teams include your primary Cardiologist (physician) and Advanced Practice Providers (APPs -  Physician Assistants and Nurse Practitioners) who all work together to provide you with the care you need, when you need it.  We recommend signing up for the patient portal called "MyChart".  Sign up information is provided on this After Visit Summary.  MyChart is used to connect with patients for Virtual Visits (Telemedicine).  Patients are able to view lab/test results, encounter notes, upcoming appointments, etc.  Non-urgent messages can be sent to your provider as well.   To learn more about what you can do with MyChart, go to [email or web address].    Your next appointment:  JUNE 2025

## 2023-06-03 NOTE — Telephone Encounter (Signed)
Patient aware.

## 2023-06-03 NOTE — Telephone Encounter (Signed)
Patient states that when she had her Evenity injection last week on 05/29/23 she experienced arm soreness, nausea, hip and thigh pain, and she felt off balance. Patient not currently feel any of those symptoms at this time.

## 2023-06-07 ENCOUNTER — Other Ambulatory Visit: Payer: Self-pay | Admitting: Internal Medicine

## 2023-06-07 NOTE — Telephone Encounter (Signed)
Currently received therapy with [facility]

## 2023-06-13 ENCOUNTER — Ambulatory Visit: Payer: Medicare Other | Admitting: Internal Medicine

## 2023-06-13 ENCOUNTER — Encounter: Payer: Self-pay | Admitting: Internal Medicine

## 2023-06-13 VITALS — BP 110/70 | HR 98 | Temp 98.2°F | Ht 61.0 in | Wt 103.0 lb

## 2023-06-13 DIAGNOSIS — J069 Acute upper respiratory infection, unspecified: Secondary | ICD-10-CM | POA: Insufficient documentation

## 2023-06-13 DIAGNOSIS — J4520 Mild intermittent asthma, uncomplicated: Secondary | ICD-10-CM | POA: Diagnosis not present

## 2023-06-13 DIAGNOSIS — M810 Age-related osteoporosis without current pathological fracture: Secondary | ICD-10-CM

## 2023-06-13 DIAGNOSIS — E559 Vitamin D deficiency, unspecified: Secondary | ICD-10-CM | POA: Diagnosis not present

## 2023-06-13 HISTORY — DX: Acute upper respiratory infection, unspecified: J06.9

## 2023-06-13 MED ORDER — ALBUTEROL SULFATE HFA 108 (90 BASE) MCG/ACT IN AERS
1.0000 | INHALATION_SPRAY | RESPIRATORY_TRACT | 5 refills | Status: AC | PRN
Start: 2023-06-13 — End: ?

## 2023-06-13 MED ORDER — AZITHROMYCIN 250 MG PO TABS: ORAL_TABLET | ORAL | 0 refills | Status: DC

## 2023-06-13 MED ORDER — PANTOPRAZOLE SODIUM 40 MG PO TBEC: 40.0000 mg | DELAYED_RELEASE_TABLET | Freq: Every day | ORAL | 11 refills | Status: DC

## 2023-06-13 NOTE — Progress Notes (Signed)
Subjective:  Patient ID: Cindy Meza, female    DOB: 1936/06/06  Age: 87 y.o. MRN: [id number]  CC: Asthma (Pt has stated she been wheezing, has a cough and sob x3 days and has been using her inhaler.)   HPI Cindy Meza presents for asthma  Outpatient Medications Prior to Visit  Medication Sig Dispense Refill   aspirin 81 MG tablet Take 81 mg by mouth daily.     Calcium-Magnesium-Vitamin D (CALCIUM 500 PO) Take 1 tablet by mouth 2 (two) times daily with a meal. With lunch and supper     cholecalciferol (VITAMIN D3) 25 MCG (1000 UNIT) tablet Take 1,000 Units by mouth daily.     [employer] OMEGA-3 KRILL OIL 500 MG CAPS Take 500 capsules by mouth daily.     Cyanocobalamin (VITAMIN B-12) 1000 MCG SUBL Place 1 tablet (1,000 mcg total) under the tongue in the morning. 100 tablet 3   estradiol (ESTRACE) 0.1 MG/GM vaginal cream Place vaginally.     fludrocortisone (FLORINEF) 0.1 MG tablet Take 0.5 tablets (0.05 mg total) by mouth 2 (two) times daily. At 8 am and 2 pm 180 tablet 3   fluticasone-salmeterol (ADVAIR HFA) 115-21 MCG/ACT inhaler INHALE 1 TO 2 PUFFS INTO THE LUNGS TWICE DAILY 12 g 11   LORazepam (ATIVAN) 0.5 MG tablet Take 0.5-1 tablets (0.25-0.5 mg total) by mouth at bedtime as needed for anxiety (insomnia, overactive bladder). 30 tablet 2   polyethylene glycol (MIRALAX / GLYCOLAX) 17 g packet Take 17 g by mouth daily.     Romosozumab-aqqg (EVENITY) 105 MG/1. SOSY injection Inject 210 mg into the skin every 30 (thirty) days.     Wheat Dextrin (BENEFIBER DRINK MIX PO) Take 1 Capful by mouth daily.     pantoprazole (PROTONIX) 20 MG tablet Take 1 tablet (20 mg total) by mouth daily. 30 tablet 11   Facility-Administered Medications Prior to Visit  Medication Dose Route Frequency Provider Last Rate Last Admin   [START ON 06/28/2023] Romosozumab-aqqg (EVENITY) 105 MG/1. injection 210 mg  210 mg Subcutaneous Once Shamleffer, Konrad Dolores, MD        ROS: Review of  Systems  Constitutional:  Positive for unexpected weight change. Negative for activity change, appetite change, chills and fatigue.  HENT:  Positive for congestion. Negative for mouth sores and sinus pressure.   Eyes:  Negative for visual disturbance.  Respiratory:  Positive for cough and wheezing. Negative for chest tightness, shortness of breath and stridor.   Gastrointestinal:  Negative for abdominal pain and nausea.  Genitourinary:  Negative for difficulty urinating, frequency and vaginal pain.  Musculoskeletal:  Negative for back pain and gait problem.  Skin:  Negative for pallor and rash.  Neurological:  Negative for dizziness, tremors, weakness, numbness and headaches.  Psychiatric/Behavioral:  Negative for confusion and sleep disturbance.     Objective:  BP 110/70 (BP Location: Left Arm, Patient Position: Sitting, Cuff Size: Normal)   Pulse 98   Temp 98.2 F (36.8 C) (Oral)   Ht 5\' 1"  (1.549 m)   Wt 103 lb (46.7 kg)   SpO2 96%   BMI 19.46 kg/m   BP Readings from Last 3 Encounters:  06/13/23 110/70  06/03/23 120/62  04/23/23 120/70    Wt Readings from Last 3 Encounters:  06/13/23 103 lb (46.7 kg)  06/03/23 103 lb (46.7 kg)  04/23/23 102 lb (46.3 kg)    Physical Exam Constitutional:      General: She is not in acute distress.  Appearance: Normal appearance. She is well-developed.  HENT:     Head: Normocephalic.     Right Ear: External ear normal.     Left Ear: External ear normal.     Nose: Nose normal.  Eyes:     General:        Right eye: No discharge.        Left eye: No discharge.     Conjunctiva/sclera: Conjunctivae normal.     Pupils: Pupils are equal, round, and reactive to light.  Neck:     Thyroid: No thyromegaly.     Vascular: No JVD.     Trachea: No tracheal deviation.  Cardiovascular:     Rate and Rhythm: Normal rate and regular rhythm.     Heart sounds: Normal heart sounds.  Pulmonary:     Effort: No respiratory distress.     Breath  sounds: No stridor. No wheezing.  Abdominal:     General: Bowel sounds are normal. There is no distension.     Palpations: Abdomen is soft. There is no mass.     Tenderness: There is no abdominal tenderness. There is no guarding or rebound.  Musculoskeletal:        General: No tenderness.     Cervical back: Normal range of motion and neck supple. No rigidity.  Lymphadenopathy:     Cervical: No cervical adenopathy.  Skin:    Findings: No erythema or rash.  Neurological:     Cranial Nerves: No cranial nerve deficit.     Motor: No abnormal muscle tone.     Coordination: Coordination normal.     Deep Tendon Reflexes: Reflexes normal.  Psychiatric:        Behavior: Behavior normal.        Thought Content: Thought content normal.        Judgment: Judgment normal.     Lab Results  Component Value Date   WBC 5.9 03/27/2023   HGB 11.8 (L) 03/27/2023   HCT 35.7 (L) 03/27/2023   PLT 209.0 03/27/2023   GLUCOSE 92 03/27/2023   CHOL 227 (H) 07/12/2021   TRIG 45 07/12/2021   HDL 107 07/12/2021   LDLDIRECT 104.0 03/17/2013   LDLCALC 112 (H) 07/12/2021   ALT 12 03/27/2023   AST 19 03/27/2023   NA 140 03/27/2023   K 3.7 03/27/2023   CL 102 03/27/2023   CREATININE 1.10 03/27/2023   BUN 17 03/27/2023   CO2 30 03/27/2023   TSH 1.27 12/14/2022   INR 1.04 06/12/2016   HGBA1C 5.0 12/27/2021    MR LUMBAR SPINE WO CONTRAST  Result Date: 09/25/2022 CLINICAL DATA:  Hyperreflexia, leg weakness. Low back pain radiating into right buttocks/leg. EXAM: MRI LUMBAR SPINE WITHOUT CONTRAST TECHNIQUE: Multiplanar, multisequence MR imaging of the lumbar spine was performed. No intravenous contrast was administered. COMPARISON:  Lumbar spine radiographs 07/20/2020. FINDINGS: Segmentation: Conventional numbering is assumed with 5 non-rib-bearing, lumbar type vertebral bodies. Alignment:  3 mm anterolisthesis of L5 on S1. Vertebrae:  Normal vertebral body heights and marrow signal. Conus medullaris and  cauda equina: Conus extends to the L1 level. Conus and cauda equina appear normal. Paraspinal and other soft tissues: Unremarkable. Disc levels: T12-L1:  Unremarkable. L1-L2:  Unremarkable. L2-L3: Disc bulge and facet arthropathy results in mild bilateral neural foraminal narrowing. No significant spinal canal stenosis. L3-L4: Right eccentric disc bulge with displacement of the traversing right L4 nerve root in the lateral recess. Mild bilateral neural foraminal narrowing. L4-L5: Right eccentric disc bulge and facet  arthropathy results in compression of the traversing right L5 nerve root in the lateral recess. Mild bilateral neural foraminal narrowing. L5-S1: Disc bulge, anterolisthesis and facet arthropathy results in mild bilateral neural foraminal narrowing. No spinal canal stenosis. IMPRESSION: 1. Right eccentric disc bulges at L3-L4 and L4-L5 result in displacement of the traversing right L4 nerve root and compression of the traversing right L5 nerve root in the lateral recess. 2. No high-grade spinal canal stenosis. Electronically Signed   By: Orvan Falconer M.D.   On: 09/25/2022 16:32    Assessment & Plan:   Problem List Items Addressed This Visit     Vitamin D deficiency   Asthma    Chronic - exacerbation  Use Advair MDI Added Ventolin prn Zpac if worse      Relevant Medications   albuterol (PROVENTIL HFA) 108 (90 Base) MCG/ACT inhaler   Osteoporosis    On Evenity, Vit D and calcium      Upper respiratory infection - Primary    Zpac if worse      Relevant Medications   azithromycin (ZITHROMAX Z-PAK) 250 MG tablet      Meds ordered this encounter  Medications   albuterol (PROVENTIL HFA) 108 (90 Base) MCG/ACT inhaler    Sig: Inhale 1-2 puffs into the lungs every 4 (four) hours as needed for wheezing or shortness of breath.    Dispense:  8.5 g    Refill:  5   azithromycin (ZITHROMAX Z-PAK) 250 MG tablet    Sig: As directed    Dispense:  6 tablet    Refill:  0    pantoprazole (PROTONIX) 40 MG tablet    Sig: Take 1 tablet (40 mg total) by mouth daily.    Dispense:  30 tablet    Refill:  11    Today  Follow-up: No follow-ups on file.  Sonda Primes, MD

## 2023-06-13 NOTE — Assessment & Plan Note (Signed)
Zpac if worse 

## 2023-06-13 NOTE — Assessment & Plan Note (Addendum)
On Evenity, Vit D and calcium

## 2023-06-13 NOTE — Assessment & Plan Note (Signed)
Chronic - exacerbation  Use Advair MDI Added Ventolin prn Zpac if worse

## 2023-06-17 ENCOUNTER — Other Ambulatory Visit: Payer: Self-pay

## 2023-06-17 ENCOUNTER — Encounter (HOSPITAL_COMMUNITY): Payer: Self-pay

## 2023-06-17 ENCOUNTER — Emergency Department (HOSPITAL_COMMUNITY): Payer: Medicare Other

## 2023-06-17 ENCOUNTER — Emergency Department (HOSPITAL_COMMUNITY)
Admission: EM | Admit: 2023-06-17 | Discharge: 2023-06-18 | Disposition: A | Discharge status: Home / Self Care | Payer: Medicare Other | Attending: Emergency Medicine | Admitting: Emergency Medicine

## 2023-06-17 DIAGNOSIS — R600 Localized edema: Secondary | ICD-10-CM | POA: Insufficient documentation

## 2023-06-17 DIAGNOSIS — R079 Chest pain, unspecified: Secondary | ICD-10-CM

## 2023-06-17 DIAGNOSIS — R7989 Other specified abnormal findings of blood chemistry: Secondary | ICD-10-CM | POA: Insufficient documentation

## 2023-06-17 DIAGNOSIS — Z7982 Long term (current) use of aspirin: Secondary | ICD-10-CM | POA: Insufficient documentation

## 2023-06-17 DIAGNOSIS — R0602 Shortness of breath: Secondary | ICD-10-CM | POA: Diagnosis present

## 2023-06-17 DIAGNOSIS — J45909 Unspecified asthma, uncomplicated: Secondary | ICD-10-CM | POA: Insufficient documentation

## 2023-06-17 DIAGNOSIS — J90 Pleural effusion, not elsewhere classified: Secondary | ICD-10-CM | POA: Insufficient documentation

## 2023-06-17 DIAGNOSIS — R0609 Other forms of dyspnea: Secondary | ICD-10-CM

## 2023-06-17 LAB — BASIC METABOLIC PANEL
Anion gap: 10 (ref 5–15)
BUN: 17 mg/dL (ref 8–23)
CO2: 21 mmol/L — ABNORMAL LOW (ref 22–32)
Calcium: 9.3 mg/dL (ref 8.9–10.3)
Chloride: 105 mmol/L (ref 98–111)
Creatinine, Ser: 1.23 mg/dL — ABNORMAL HIGH (ref 0.44–1.00)
GFR, Estimated: 43 mL/min — ABNORMAL LOW (ref >60)
Glucose, Bld: 165 mg/dL — ABNORMAL HIGH (ref 70–99)
Potassium: 3.8 mmol/L (ref 3.5–5.1)
Sodium: 136 mmol/L (ref 135–145)

## 2023-06-17 LAB — CBC
HCT: 35.6 % — ABNORMAL LOW (ref 36.0–46.0)
Hemoglobin: 11.1 g/dL — ABNORMAL LOW (ref 12.0–15.0)
MCH: 30.6 pg (ref 26.0–34.0)
MCHC: 31.2 g/dL (ref 30.0–36.0)
MCV: 98.1 fL (ref 80.0–100.0)
Platelets: 217 10*3/uL (ref 150–400)
RBC: 3.63 MIL/uL — ABNORMAL LOW (ref 3.87–5.11)
RDW: 13.2 % (ref 11.5–15.5)
WBC: 5.5 10*3/uL (ref 4.0–10.5)
nRBC: 0 % (ref 0.0–0.2)

## 2023-06-17 LAB — BRAIN NATRIURETIC PEPTIDE: B Natriuretic Peptide: 3167.4 pg/mL — ABNORMAL HIGH (ref 0.0–100.0)

## 2023-06-17 LAB — TROPONIN I (HIGH SENSITIVITY)
Troponin I (High Sensitivity): 13 ng/L (ref <18)
Troponin I (High Sensitivity): 20 ng/L — ABNORMAL HIGH (ref <18)

## 2023-06-17 NOTE — ED Provider Triage Note (Signed)
Emergency Medicine Provider Triage Evaluation Note  Meryl Sroczynski , a 87 y.o. female  was evaluated in triage.  Pt complains of shob. Been going on for several days but worse today. Noticed it while lying in bed this morning but also worse with exertion. Denies chest pain. Was seen by PCP and started on azithromycin, pantoprazole and albuterol. Endorsed intermittent cough.  Review of Systems  Positive: See above Negative: See above  Physical Exam  BP (!) 136/98 (BP Location: Right Arm)   Pulse (!) 111   Temp 97.6 F (36.4 C) (Oral)   Resp (!) 25   SpO2 96%  Gen:   Awake, no distress   Resp:  Normal effort  MSK:   Moves extremities without difficulty  Other:    Medical Decision Making  Medically screening exam initiated at 3:41 PM.  Appropriate orders placed.  Hollianne Sanchez was informed that the remainder of the evaluation will be completed by another provider, this initial triage assessment does not replace that evaluation, and the importance of remaining in the ED until their evaluation is complete.  Work up started   Gareth Eagle, PA-C 06/17/23 1542

## 2023-06-17 NOTE — ED Triage Notes (Signed)
Pt is coming in with shortness of breath that she attributes to allergies that she went to see her pcp for around a week ago, he prescribed her medications. Such as inhalers to which she mentions it has no improved and today she states she had the increase in her shortness of breath. She is otherwise stable and able to complete all sentences. Nomention of chest pains or any other concerns by the patient.

## 2023-06-18 ENCOUNTER — Emergency Department (HOSPITAL_COMMUNITY): Admit: 2023-06-18 | Payer: Medicare Other | Source: Home / Self Care

## 2023-06-18 ENCOUNTER — Other Ambulatory Visit: Payer: Self-pay

## 2023-06-18 ENCOUNTER — Emergency Department (HOSPITAL_COMMUNITY): Payer: Medicare Other

## 2023-06-18 MED ORDER — POTASSIUM CHLORIDE CRYS ER 20 MEQ PO TBCR
20.0000 meq | EXTENDED_RELEASE_TABLET | Freq: Once | ORAL | Status: AC
  Administered 2023-06-18: 20 meq via ORAL
  Filled 2023-06-18: qty 1

## 2023-06-18 MED ORDER — ASPIRIN 81 MG PO CHEW: 324.0000 mg | CHEWABLE_TABLET | Freq: Once | ORAL | Status: DC

## 2023-06-18 MED ORDER — MORPHINE SULFATE (PF) 2 MG/ML IV SOLN: 2.0000 mg | Freq: Once | INTRAVENOUS | Status: DC

## 2023-06-18 MED ORDER — POTASSIUM CHLORIDE ER 10 MEQ PO TBCR: 10.0000 meq | EXTENDED_RELEASE_TABLET | Freq: Every day | ORAL | 0 refills | Status: DC

## 2023-06-18 MED ORDER — FUROSEMIDE 20 MG PO TABS: 20.0000 mg | ORAL_TABLET | Freq: Every day | ORAL | 0 refills | Status: DC

## 2023-06-18 MED ORDER — FUROSEMIDE 10 MG/ML IJ SOLN
20.0000 mg | Freq: Once | INTRAMUSCULAR | Status: AC
  Administered 2023-06-18: 20 mg via INTRAVENOUS
  Filled 2023-06-18: qty 2

## 2023-06-18 MED ORDER — ONDANSETRON HCL 4 MG/2ML IJ SOLN: 4.0000 mg | Freq: Once | INTRAMUSCULAR | Status: DC

## 2023-06-18 NOTE — ED Notes (Signed)
Pt ambulated with a safe, steady gait. On room air, the lowest she went down to was 92% O2. The majority was at 95%. PA Small made aware.

## 2023-06-18 NOTE — ED Provider Notes (Signed)
[place] EMERGENCY DEPARTMENT AT [facility] Provider Note   CSN: [id number] Arrival date & time: 06/17/23  1515     History  Chief Complaint  Patient presents with   Shortness of Breath    Cindy Meza is a 87 y.o. female, hx of asthma, history of tobacco abuse, who presents to the ED secondary to shortness of breath, has been going on for the past week.  She notes that for the past week, she has more shortness of breath on exertion, has felt more winded going to the mailbox.  Notes that she has seen her primary care doctor, who prescribed her with albuterol and a Z-Pak, for concern for an asthma, versus COPD flare, and she states there is minimal relief.  She denies any kind of fevers, chills, runny nose, congestion.  Has noticed that she has swelling of her bilateral lower extremities.  Has been wearing compression stockings, without any real improvement.  Notes that she has also had some chest tightness, worse at night, especially when she is laying down at night, and she has difficulty breathing at night.  States she has been getting up to use the bathroom more frequently, urinating more frequently.   Home Medications Prior to Admission medications   Medication Sig Start Date End Date Taking? Authorizing Provider  furosemide (LASIX) 20 MG tablet Take 1 tablet (20 mg total) by mouth daily. 06/18/23  Yes Kyra Laffey L, PA  potassium chloride (KLOR-CON) 10 MEQ tablet Take 1 tablet (10 mEq total) by mouth daily. 06/18/23  Yes Avian Konigsberg L, PA  albuterol (PROVENTIL HFA) 108 (90 Base) MCG/ACT inhaler Inhale 1-2 puffs into the lungs every 4 (four) hours as needed for wheezing or shortness of breath. 06/13/23   [name], [name], MD  aspirin 81 MG tablet Take 81 mg by mouth daily.    [provider]  azithromycin (ZITHROMAX Z-PAK) 250 MG tablet As directed 06/13/23   [name], [name], MD  Calcium-Magnesium-Vitamin D (CALCIUM 500 PO) Take 1 tablet by mouth  2 (two) times daily with a meal. With lunch and supper      cholecalciferol (VITAMIN D3) 25 MCG (1000 UNIT) tablet Take 1,000 Units by mouth daily.    [provider]  [employer] OMEGA-3 KRILL OIL 500 MG CAPS Take 500 capsules by mouth daily.    [provider]  Cyanocobalamin (VITAMIN B-12) 1000 MCG SUBL Place 1 tablet (1,000 mcg total) under the tongue in the morning. 05/03/21   Arnaldo Natal, NP  estradiol (ESTRACE) 0.1 MG/GM vaginal cream Place vaginally. 07/27/21   [provider]  fludrocortisone (FLORINEF) 0.1 MG tablet Take 0.5 tablets (0.05 mg total) by mouth 2 (two) times daily. At 8 am and 2 pm 06/12/22   Pricilla Riffle, MD  fluticasone-salmeterol (ADVAIR HFA) [phone] MCG/ACT inhaler INHALE 1 TO 2 PUFFS INTO THE LUNGS TWICE DAILY 06/09/23   [name], [name], MD  LORazepam (ATIVAN) 0.5 MG tablet Take 0.5-1 tablets (0.25-0.5 mg total) by mouth at bedtime as needed for anxiety (insomnia, overactive bladder). 03/27/23   [name], [name], MD  pantoprazole (PROTONIX) 40 MG tablet Take 1 tablet (40 mg total) by mouth daily. 06/13/23   [name], [name], MD  polyethylene glycol (MIRALAX / GLYCOLAX) 17 g packet Take 17 g by mouth daily.    [provider]  Romosozumab-aqqg (EVENITY) 105 MG/1. SOSY injection Inject 210 mg into the skin every 30 (thirty) days.    [provider]  Wheat Dextrin (BENEFIBER DRINK MIX PO) Take 1 Capful by mouth daily.    [provider]      Allergies    Fosamax [alendronate sodium], Gabapentin, Lidocaine, Midodrine, Risedronate sodium, Shellfish allergy, and Statins    Review of Systems   Review of Systems  Constitutional:  Negative for fever.  Respiratory:  Positive for shortness of breath.   Cardiovascular:  Positive for chest pain and leg swelling.    Physical Exam Updated Vital Signs BP 105/82   Pulse 96   Temp 97.7 F (36.5 C) (Oral)   Resp 18   Ht 5\' 1"  (1.549 m)   Wt 46.7 kg   SpO2  100%   BMI 19.45 kg/m  Physical Exam Vitals and nursing note reviewed.  Constitutional:      General: She is not in acute distress.    Appearance: She is well-developed.  HENT:     Head: Normocephalic and atraumatic.  Eyes:     Conjunctiva/sclera: Conjunctivae normal.  Cardiovascular:     Rate and Rhythm: Normal rate and regular rhythm.     Heart sounds: Murmur heard.  Pulmonary:     Effort: Pulmonary effort is normal. No respiratory distress.     Breath sounds: Examination of the right-lower field reveals decreased breath sounds. Examination of the left-lower field reveals decreased breath sounds. Decreased breath sounds present. No wheezing.  Abdominal:     Palpations: Abdomen is soft.     Tenderness: There is no abdominal tenderness.  Musculoskeletal:        General: No swelling.     Cervical back: Neck supple.  Skin:    General: Skin is warm and dry.     Capillary Refill: Capillary refill takes less than 2 seconds.  Neurological:     Mental Status: She is alert.  Psychiatric:        Mood and Affect: Mood normal.     ED Results / Procedures / Treatments   Labs (all labs ordered are listed, but only abnormal results are displayed) Labs Reviewed  BASIC METABOLIC PANEL - Abnormal; Notable for the following components:      Result Value   CO2 21 (*)    Glucose, Bld 165 (*)    Creatinine, Ser 1.23 (*)    GFR, Estimated 43 (*)    All other components within normal limits  CBC - Abnormal; Notable for the following components:   RBC 3.63 (*)    Hemoglobin 11.1 (*)    HCT 35.6 (*)    All other components within normal limits  BRAIN NATRIURETIC PEPTIDE - Abnormal; Notable for the following components:   B Natriuretic Peptide 3,167.4 (*)    All other components within normal limits  TROPONIN I (HIGH SENSITIVITY) - Abnormal; Notable for the following components:   Troponin I (High Sensitivity) 20 (*)    All other components within normal limits  TROPONIN I (HIGH  SENSITIVITY)    EKG EKG Interpretation Date/Time:  Monday June 17 2023 15:36:58 EST Ventricular Rate:  105 PR Interval:  136 QRS Duration:  116 QT Interval:  360 QTC Calculation: 475 R Axis:   98  Text Interpretation: Sinus tachycardia with Premature supraventricular complexes Pulmonary disease pattern Confirmed by Palumbo, April ([id number]) on 06/18/2023 5:11:50 AM  Radiology DG Chest 2 View  Result Date: 06/17/2023 CLINICAL DATA:  Shortness of breath EXAM: CHEST - 2 VIEW COMPARISON:  Chest x-ray 05/13/2018. FINDINGS: Juanito Gonyer 2 moderate bilateral pleural effusions and adjacent lung opacities. No pneumothorax.  Enlarged cardiopericardial silhouette with some prominence of the central vasculature. Calcified aorta. Degenerative changes along the spine. Osteopenia. IMPRESSION: Woodrow Dulski 2 moderate bilateral pleural effusions with the adjacent lung opacities. Atelectasis versus infiltrate. Recommend follow-up. Enlarged heart Electronically Signed   By: Karen Kays M.D.   On: 06/17/2023 17:12    Procedures Procedures    Medications Ordered in ED Medications  furosemide (LASIX) injection 20 mg (20 mg Intravenous Given 06/18/23 0833)  potassium chloride SA (KLOR-CON M) CR tablet 20 mEq (20 mEq Oral Given 06/18/23 1610)    ED Course/ Medical Decision Making/ A&P                                 Medical Decision Making Patient is a well-appearing 87 year old female, with diminished breath sounds of bilateral lower lungs, she has been short of breath on exertion, and had some bilateral lower extremity swelling.  And some chest tightness, with worsening shortness of breath at night especially when laying down.  Chest x-ray shows bilateral lower lobe effusions, as well as some opacities.  She does not have any viral symptoms, fever, chills, nasal congestion cough other than a mild productive cough, which is likely associated with the pulmonary effusions.  I think this is unlikely to be pneumonia.   Additionally she had a Z-Pak and did not improve at all.  Her BNP is significantly elevated, and her troponin slightly bumped, likely secondary to fluid overload.  She is not hypoxic, on room air, we will ambulate her, after giving her IV Lasix, and discussed with cardiology, on possible discharge, she is so well-appearing, and that would be patient's preference.  I offered her admission, but she is declined at this time  Amount and/or Complexity of Data Reviewed Labs: ordered.    Details: Elevated BNP, mildly elevated troponin of 20 Radiology: ordered.    Details: Chest x-ray shows Lenise Jr to moderate bilateral pleural effusions with adjacent lung opacities ECG/medicine tests:  Decision-making details documented in ED Course. Discussion of management or test interpretation with external provider(s): Discussed with patient's, and her family at bedside, concern for CHF, new onset.  Unlikely has been going on for some time, given the dyspnea on exertion that is been worsening.  She is feeling better after the IV Lasix, is able to breathe clearly.  Ambulation, pulse ox, shows 97%, when ambulating on room air.  I spoke with cardiology on-call, they are okay with follow-up with cardiology outpatient, and Lasix 20 mg daily for 5 days with potassium supplementation.  I discussed this with patient, daughter, and son at bedside, and they are in agreement with plan.  Patient would prefer this compared to admission for inpatient.  Her symptoms do not appear to be viral, or infectious appearing.  Aligns with CHF exacerbation.  Recommended for outpatient follow-up with cardiology for echocardiogram and further management outpatient.  Believe the chest tightness/pressure is likely secondary to fluid overload, which is elevated troponin.  Has no current chest pain on exam  Risk Prescription drug management.    Final Clinical Impression(s) / ED Diagnoses Final diagnoses:  Bilateral pleural effusion  Dyspnea on  exertion  Bilateral edema of lower extremity  Chest pain, unspecified type  Elevated troponin    Rx / DC Orders ED Discharge Orders          Ordered    furosemide (LASIX) 20 MG tablet  Daily  06/18/23 1102    potassium chloride (KLOR-CON) 10 MEQ tablet  Daily        06/18/23 1102    Ambulatory referral to Cardiology       Comments: If you have not heard from the Cardiology office within the next 72 hours please call [phone].   06/18/23 1103              Jahmir Salo, Harley [place], PA 06/18/23 1242    Lonell Grandchild, MD 06/18/23 1554

## 2023-06-18 NOTE — Discharge Instructions (Addendum)
Please follow-up with your cardiologist, for further evaluation.  I am suspicious that you likely have heart failure, and we have prescribed you some diuretics, to help with the fluid on your chest.  This should also help with the fluid on your extremities.  If you have worsening shortness of breath, fevers, chills, or chest pain please return to the ER.

## 2023-06-18 NOTE — ED Provider Notes (Signed)
[place] EMERGENCY DEPARTMENT AT [facility] Provider Note   CSN: [id number] Arrival date & time: 06/18/23  1659     History  No chief complaint on file.   Cindy Meza is a 87 y.o. female.  HPI Please disregard note, patient was not seen by me.  Patient originally registered under the wrong name    Home Medications Prior to Admission medications   Medication Sig Start Date End Date Taking? Authorizing Provider  albuterol (PROVENTIL HFA) 108 (90 Base) MCG/ACT inhaler Inhale 1-2 puffs into the lungs every 4 (four) hours as needed for wheezing or shortness of breath. 06/13/23   [name], [name], MD  aspirin 81 MG tablet Take 81 mg by mouth daily.    [provider]  azithromycin (ZITHROMAX Z-PAK) 250 MG tablet As directed 06/13/23   [name], [name], MD  Calcium-Magnesium-Vitamin D (CALCIUM 500 PO) Take 1 tablet by mouth 2 (two) times daily with a meal. With lunch and supper      cholecalciferol (VITAMIN D3) 25 MCG (1000 UNIT) tablet Take 1,000 Units by mouth daily.    [provider]  [employer] OMEGA-3 KRILL OIL 500 MG CAPS Take 500 capsules by mouth daily.    [provider]  Cyanocobalamin (VITAMIN B-12) 1000 MCG SUBL Place 1 tablet (1,000 mcg total) under the tongue in the morning. 05/03/21   Arnaldo Natal, NP  estradiol (ESTRACE) 0.1 MG/GM vaginal cream Place vaginally. 07/27/21   [provider]  fludrocortisone (FLORINEF) 0.1 MG tablet Take 0.5 tablets (0.05 mg total) by mouth 2 (two) times daily. At 8 am and 2 pm 06/12/22   Pricilla Riffle, MD  fluticasone-salmeterol (ADVAIR HFA) [phone] MCG/ACT inhaler INHALE 1 TO 2 PUFFS INTO THE LUNGS TWICE DAILY 06/09/23   [name], [name], MD  furosemide (LASIX) 20 MG tablet Take 1 tablet (20 mg total) by mouth daily. 06/18/23   Small, Brooke L, PA  LORazepam (ATIVAN) 0.5 MG tablet Take 0.5-1 tablets (0.25-0.5 mg total) by mouth at bedtime as needed for anxiety (insomnia,  overactive bladder). 03/27/23   [name], [name], MD  pantoprazole (PROTONIX) 40 MG tablet Take 1 tablet (40 mg total) by mouth daily. 06/13/23   [name], [name], MD  polyethylene glycol (MIRALAX / GLYCOLAX) 17 g packet Take 17 g by mouth daily.    [provider]  potassium chloride (KLOR-CON) 10 MEQ tablet Take 1 tablet (10 mEq total) by mouth daily. 06/18/23   Small, Brooke L, PA  Romosozumab-aqqg (EVENITY) 105 MG/1. SOSY injection Inject 210 mg into the skin every 30 (thirty) days.    [provider]  Wheat Dextrin (BENEFIBER DRINK MIX PO) Take 1 Capful by mouth daily.    [provider]      Allergies    Fosamax [alendronate sodium], Gabapentin, Lidocaine, Midodrine, Risedronate sodium, Shellfish allergy, and Statins    Review of Systems   Review of Systems  Reason unable to perform ROS: Advanced dementia.    Physical Exam Updated Vital Signs BP (!) 159/95 (BP Location: Right Arm)   Pulse 72   Temp 97.8 F (36.6 C) (Oral)   Resp (!) 22   SpO2 99%  Physical Exam Vitals and nursing note reviewed.   Gen: NAD Eyes: PERRL, EOMI HEENT: no oropharyngeal swelling Neck: trachea midline Resp: clear to auscultation bilaterally Card: RRR, no murmurs, rubs, or gallops Abd: nontender, nondistended Extremities: no calf tenderness, no edema Vascular: 2+ radial pulses bilaterally, 2+ DP pulses bilaterally  Skin: no rashes Psyc: acting appropriately   ED Results / Procedures / Treatments   Labs (all labs ordered are listed, but only abnormal results are displayed) Labs Reviewed  BASIC METABOLIC PANEL  CBC  TROPONIN I (HIGH SENSITIVITY)    EKG None  Radiology DG Chest 2 View  Result Date: 06/17/2023 CLINICAL DATA:  Shortness of breath EXAM: CHEST - 2 VIEW COMPARISON:  Chest x-ray 05/13/2018. FINDINGS: Small 2 moderate bilateral pleural effusions and adjacent lung opacities. No pneumothorax. Enlarged cardiopericardial silhouette with some  prominence of the central vasculature. Calcified aorta. Degenerative changes along the spine. Osteopenia. IMPRESSION: Small 2 moderate bilateral pleural effusions with the adjacent lung opacities. Atelectasis versus infiltrate. Recommend follow-up. Enlarged heart Electronically Signed   By: Karen Kays M.D.   On: 06/17/2023 17:12    Procedures Procedures    Medications Ordered in ED Medications  aspirin chewable tablet 324 mg (has no administration in time range)  morphine (PF) 2 MG/ML injection 2 mg (has no administration in time range)  ondansetron (ZOFRAN) injection 4 mg (has no administration in time range)    ED Course/ Medical Decision Making/ A&P                                 Medical Decision Making Amount and/or Complexity of Data Reviewed Labs: ordered. Radiology: ordered.  Risk OTC drugs. Prescription drug management.           Final Clinical Impression(s) / ED Diagnoses Final diagnoses:  None    Rx / DC Orders ED Discharge Orders     None         Durwin Glaze, MD 06/19/23 0003

## 2023-06-18 NOTE — ED Notes (Signed)
Ambulated pt on pulse ox. Maintained 97% O2 sat on RA. PA Small made aware.

## 2023-06-19 ENCOUNTER — Telehealth: Payer: Self-pay

## 2023-06-19 NOTE — Transitions of Care (Post Inpatient/ED Visit) (Signed)
   06/19/2023  Name: Cindy Meza MRN: [id number] DOB: 02-05-1936  Today's TOC FU Call Status: Today's TOC FU Call Status:: Unsuccessful Call (1st Attempt) Unsuccessful Call (1st Attempt) Date: 06/19/23  Attempted to reach the patient regarding the most recent Inpatient/ED visit.  Follow Up Plan: Additional outreach attempts will be made to reach the patient to complete the Transitions of Care (Post Inpatient/ED visit) call.   Signature Karena Addison, LPN [facility] Nurse Health Advisor Direct Dial [phone]

## 2023-06-25 NOTE — Transitions of Care (Post Inpatient/ED Visit) (Signed)
06/25/2023  Name: Cindy Meza MRN: [id number] DOB: [birth year]  Today's TOC FU Call Status: Today's TOC FU Call Status:: Successful TOC FU Call Completed Unsuccessful Call (1st Attempt) Date: 06/19/23 [facility] FU Call Complete Date: 06/25/23 Patient's Name and Date of Birth confirmed.  Transition Care Management Follow-up Telephone Call Date of Discharge: 06/18/23 Discharge Facility: [facility]) Type of Discharge: Inpatient Admission Primary Inpatient Discharge Diagnosis:: pleural effusion How have you been since you were released from the hospital?: Same Any questions or concerns?: No  Items Reviewed: Did you receive and understand the discharge instructions provided?: Yes Medications obtained,verified, and reconciled?: Yes (Medications Reviewed) Any new allergies since your discharge?: No Dietary orders reviewed?: Yes Do you have support at home?: No  Medications Reviewed Today: Medications Reviewed Today     Reviewed by Karena Addison, LPN (Licensed Practical Nurse) on 06/25/23 at 1440  Med List Status: <None>   Medication Order Taking? Sig Documenting Provider Last Dose Status Informant  albuterol (PROVENTIL HFA) 108 (90 Base) MCG/ACT inhaler [phone]  Inhale 1-2 puffs into the lungs every 4 (four) hours as needed for wheezing or shortness of breath. [name], [name], MD  Active   aspirin 81 MG tablet [id number] No Take 81 mg by mouth daily. [provider] Taking Active Self  azithromycin (ZITHROMAX Z-PAK) 250 MG tablet [id number]  As directed [name], [name], MD  Active   Calcium-Magnesium-Vitamin D (CALCIUM 500 PO) [id number] No Take 1 tablet by mouth 2 (two) times daily with a meal. With lunch and supper  Taking Active Self  cholecalciferol (VITAMIN D3) 25 MCG (1000 UNIT) tablet [id number] No Take 1,000 Units by mouth daily. [provider] Taking Active   [employer] OMEGA-3 KRILL OIL 500 MG CAPS [id number] No Take 500 capsules by mouth daily.  [provider] Taking Active   Cyanocobalamin (VITAMIN B-12) 1000 MCG SUBL [id number] No Place 1 tablet (1,000 mcg total) under the tongue in the morning. Arnaldo Natal, NP Taking Active   estradiol (ESTRACE) 0.1 MG/GM vaginal cream [id number] No Place vaginally. [provider] Taking Active   fludrocortisone (FLORINEF) 0.1 MG tablet [id number] No Take 0.5 tablets (0.05 mg total) by mouth 2 (two) times daily. At 8 am and 2 pm Pricilla Riffle, MD Taking Active   fluticasone-salmeterol (ADVAIR [facility]) 948-54 MCG/ACT inhaler [id number] No INHALE 1 TO 2 PUFFS INTO THE LUNGS TWICE DAILY [name], [name], MD Taking Active   furosemide (LASIX) 20 MG tablet [id number]  Take 1 tablet (20 mg total) by mouth daily. Small, Brooke L, PA  Active   LORazepam (ATIVAN) 0.5 MG tablet [id number] No Take 0.5-1 tablets (0.25-0.5 mg total) by mouth at bedtime as needed for anxiety (insomnia, overactive bladder). [name], [name], MD Taking Active   pantoprazole (PROTONIX) 40 MG tablet [id number]  Take 1 tablet (40 mg total) by mouth daily. [name], [name], MD  Active   polyethylene glycol (MIRALAX / GLYCOLAX) 17 g packet [id number] No Take 17 g by mouth daily. [provider] Taking Active Self  potassium chloride (KLOR-CON) 10 MEQ tablet [id number]  Take 1 tablet (10 mEq total) by mouth daily. Small, Brooke L, PA  Active   Romosozumab-aqqg (EVENITY) 105 MG/1. injection 210 mg [id number]   Shamleffer, Konrad Dolores, MD  Active   Romosozumab-aqqg (EVENITY) 105 MG/1. SOSY injection [id number] No Inject 210 mg into the skin every 30 (thirty) days. [provider] Taking Active   Wheat Dextrin (BENEFIBER DRINK MIX PO) [id number] No Take  1 Capful by mouth daily. [provider] Taking Active             Home Care and Equipment/Supplies: Were Home Health Services Ordered?: NA Any new equipment or medical supplies ordered?: NA  Functional Questionnaire: Do  you need assistance with bathing/showering or dressing?: No Do you need assistance with meal preparation?: No Do you need assistance with eating?: No Do you have difficulty maintaining continence: No Do you need assistance with getting out of bed/getting out of a chair/moving?: No Do you have difficulty managing or taking your medications?: No  Follow up appointments reviewed: PCP Follow-up appointment confirmed?: Yes Date of PCP follow-up appointment?: 06/26/23 Follow-up Provider: [facility] Follow-up appointment confirmed?: Yes Date of Specialist follow-up appointment?: 07/11/23 Follow-Up Specialty Provider:: cardio Do you need transportation to your follow-up appointment?: No Do you understand care options if your condition(s) worsen?: Yes-patient verbalized understanding    SIGNATURE Karena Addison, LPN [facility] Nurse Health Advisor Direct Dial [phone]

## 2023-06-26 ENCOUNTER — Encounter: Payer: Self-pay | Admitting: Internal Medicine

## 2023-06-26 ENCOUNTER — Ambulatory Visit: Payer: Medicare Other | Admitting: Internal Medicine

## 2023-06-26 ENCOUNTER — Other Ambulatory Visit: Payer: Self-pay | Admitting: Internal Medicine

## 2023-06-26 VITALS — BP 110/70 | HR 44 | Temp 98.2°F | Ht 61.0 in | Wt 98.0 lb

## 2023-06-26 DIAGNOSIS — I2510 Atherosclerotic heart disease of native coronary artery without angina pectoris: Secondary | ICD-10-CM

## 2023-06-26 DIAGNOSIS — R0609 Other forms of dyspnea: Secondary | ICD-10-CM

## 2023-06-26 DIAGNOSIS — J4520 Mild intermittent asthma, uncomplicated: Secondary | ICD-10-CM | POA: Diagnosis not present

## 2023-06-26 DIAGNOSIS — K219 Gastro-esophageal reflux disease without esophagitis: Secondary | ICD-10-CM

## 2023-06-26 MED ORDER — BUDESONIDE-FORMOTEROL FUMARATE 80-4.5 MCG/ACT IN AERO: 2.0000 | INHALATION_SPRAY | Freq: Two times a day (BID) | RESPIRATORY_TRACT | 11 refills | Status: DC

## 2023-06-26 MED ORDER — FUROSEMIDE 20 MG PO TABS: 20.0000 mg | ORAL_TABLET | Freq: Every day | ORAL | 0 refills | Status: DC | PRN

## 2023-06-26 MED ORDER — POTASSIUM CHLORIDE ER 10 MEQ PO TBCR: 10.0000 meq | EXTENDED_RELEASE_TABLET | Freq: Every day | ORAL | 1 refills | Status: DC | PRN

## 2023-06-26 NOTE — Progress Notes (Signed)
Subjective:  Patient ID: Cindy Meza, female    DOB: 06-15-1936  Age: 87 y.o. MRN: [id number]  CC: Medical Management of Chronic Issues (3 mnth f/u, discuss recent ER visit and dx)   HPI Cindy Meza presents for B pleural effusions, URI, DOE, edema   Per ER: " Outpatient Medications Prior to Visit  Medication Sig Dispense Refill   albuterol (PROVENTIL HFA) 108 (90 Base) MCG/ACT inhaler Inhale 1-2 puffs into the lungs every 4 (four) hours as needed for wheezing or shortness of breath. 8.5 g 5   aspirin 81 MG tablet Take 81 mg by mouth daily.     Calcium-Magnesium-Vitamin D (CALCIUM 500 PO) Take 1 tablet by mouth 2 (two) times daily with a meal. With lunch and supper     cholecalciferol (VITAMIN D3) 25 MCG (1000 UNIT) tablet Take 1,000 Units by mouth daily.     [employer] OMEGA-3 KRILL OIL 500 MG CAPS Take 500 capsules by mouth daily.     Cyanocobalamin (VITAMIN B-12) 1000 MCG SUBL Place 1 tablet (1,000 mcg total) under the tongue in the morning. 100 tablet 3   estradiol (ESTRACE) 0.1 MG/GM vaginal cream Place vaginally.     fludrocortisone (FLORINEF) 0.1 MG tablet Take 0.5 tablets (0.05 mg total) by mouth 2 (two) times daily. At 8 am and 2 pm 180 tablet 3   LORazepam (ATIVAN) 0.5 MG tablet Take 0.5-1 tablets (0.25-0.5 mg total) by mouth at bedtime as needed for anxiety (insomnia, overactive bladder). 30 tablet 2   pantoprazole (PROTONIX) 40 MG tablet Take 1 tablet (40 mg total) by mouth daily. 30 tablet 11   polyethylene glycol (MIRALAX / GLYCOLAX) 17 g packet Take 17 g by mouth daily.     Romosozumab-aqqg (EVENITY) 105 MG/1. SOSY injection Inject 210 mg into the skin every 30 (thirty) days.     Wheat Dextrin (BENEFIBER DRINK MIX PO) Take 1 Capful by mouth daily.     fluticasone-salmeterol (ADVAIR HFA) 115-21 MCG/ACT inhaler INHALE 1 TO 2 PUFFS INTO THE LUNGS TWICE DAILY 12 g 11   furosemide (LASIX) 20 MG tablet Take 1 tablet (20 mg total) by mouth daily. 5 tablet 0    potassium chloride (KLOR-CON) 10 MEQ tablet Take 1 tablet (10 mEq total) by mouth daily. 5 tablet 0   azithromycin (ZITHROMAX Z-PAK) 250 MG tablet As directed (Patient not taking: Reported on 06/26/2023) 6 tablet 0   Facility-Administered Medications Prior to Visit  Medication Dose Route Frequency Provider Last Rate Last Admin   [START ON 06/28/2023] Romosozumab-aqqg (EVENITY) 105 MG/1. injection 210 mg  210 mg Subcutaneous Once Shamleffer, Konrad Dolores, MD        ROS: Review of Systems  Constitutional:  Positive for fatigue.  Respiratory:  Positive for shortness of breath. Negative for wheezing.   Psychiatric/Behavioral:  Negative for decreased concentration. The patient is not nervous/anxious.     Objective:  BP 110/70 (BP Location: Left Arm, Patient Position: Sitting, Cuff Size: Normal)   Pulse (!) 44   Temp 98.2 F (36.8 C) (Oral)   Ht 5\' 1"  (1.549 m)   Wt 98 lb (44.5 kg)   SpO2 95%   BMI 18.52 kg/m   BP Readings from Last 3 Encounters:  06/26/23 110/70  06/18/23 105/82  06/13/23 110/70    Wt Readings from Last 3 Encounters:  06/26/23 98 lb (44.5 kg)  06/18/23 102 lb 15.3 oz (46.7 kg)  06/13/23 103 lb (46.7 kg)    Physical Exam Constitutional:  General: She is not in acute distress.    Appearance: Normal appearance. She is well-developed.  HENT:     Head: Normocephalic.     Right Ear: External ear normal.     Left Ear: External ear normal.     Nose: Nose normal.  Eyes:     General:        Right eye: No discharge.        Left eye: No discharge.     Conjunctiva/sclera: Conjunctivae normal.     Pupils: Pupils are equal, round, and reactive to light.  Neck:     Thyroid: No thyromegaly.     Vascular: No JVD.     Trachea: No tracheal deviation.  Cardiovascular:     Rate and Rhythm: Normal rate and regular rhythm.     Heart sounds: Normal heart sounds.  Pulmonary:     Effort: No respiratory distress.     Breath sounds: No stridor. No wheezing.   Abdominal:     General: Bowel sounds are normal. There is no distension.     Palpations: Abdomen is soft. There is no mass.     Tenderness: There is no abdominal tenderness. There is no guarding or rebound.  Musculoskeletal:        General: No tenderness.     Cervical back: Normal range of motion and neck supple. No rigidity.  Lymphadenopathy:     Cervical: No cervical adenopathy.  Skin:    Findings: No erythema or rash.  Neurological:     Cranial Nerves: No cranial nerve deficit.     Motor: No abnormal muscle tone.     Coordination: Coordination normal.     Deep Tendon Reflexes: Reflexes normal.  Psychiatric:        Behavior: Behavior normal.        Thought Content: Thought content normal.        Judgment: Judgment normal.   Thin    A total time of 45 minutes was spent preparing to see the patient, reviewing tests, x-rays, ER reports and other medical records.  Also, obtaining history and performing comprehensive physical exam.  Additionally, counseling the patient regarding the above listed issues.   Finally, documenting clinical information in the health records, coordination of care, educating the patient re CHF, asthma. It is a complex case.   Lab Results  Component Value Date   WBC 5.5 06/17/2023   HGB 11.1 (L) 06/17/2023   HCT 35.6 (L) 06/17/2023   PLT 217 06/17/2023   GLUCOSE 165 (H) 06/17/2023   CHOL 227 (H) 07/12/2021   TRIG 45 07/12/2021   HDL 107 07/12/2021   LDLDIRECT 104.0 03/17/2013   LDLCALC 112 (H) 07/12/2021   ALT 12 03/27/2023   AST 19 03/27/2023   NA 136 06/17/2023   K 3.8 06/17/2023   CL 105 06/17/2023   CREATININE 1.23 (H) 06/17/2023   BUN 17 06/17/2023   CO2 21 (L) 06/17/2023   TSH 1.27 12/14/2022   INR 1.04 06/12/2016   HGBA1C 5.0 12/27/2021    No results found.  Assessment & Plan:   Problem List Items Addressed This Visit     Asthma   Change to Symbicort bid Advair is not covered      Relevant Medications    budesonide-formoterol (SYMBICORT) 80-4.5 MCG/ACT inhaler   GERD (gastroesophageal reflux disease)   Pantoprazole to 20 mg/d       Coronary atherosclerosis   Statin intolerant ASA, krill oil      Relevant Medications  furosemide (LASIX) 20 MG tablet   DOE (dyspnea on exertion) - Primary   ?CHF Obtain 2 D ECHO F/u w/Cardiology - pending Lasix/Kdur prn      Relevant Orders   ECHOCARDIOGRAM COMPLETE      Meds ordered this encounter  Medications   furosemide (LASIX) 20 MG tablet    Sig: Take 1 tablet (20 mg total) by mouth daily as needed for edema.    Dispense:  30 tablet    Refill:  0   potassium chloride (KLOR-CON) 10 MEQ tablet    Sig: Take 1 tablet (10 mEq total) by mouth daily as needed.    Dispense:  30 tablet    Refill:  1   budesonide-formoterol (SYMBICORT) 80-4.5 MCG/ACT inhaler    Sig: Inhale 2 puffs into the lungs 2 (two) times daily.    Dispense:  1 each    Refill:  11      Follow-up: No follow-ups on file.  Sonda Primes, MD

## 2023-06-26 NOTE — Patient Instructions (Signed)
[facility] Emergency room  Address: [address], [place], [place] [address] Hours: Open 24 hours Phone: [phone]   [place] [facility] Address: [address] [place],  [address] Hours: Open 24 hours Phone: [phone]

## 2023-06-26 NOTE — Assessment & Plan Note (Signed)
Statin intolerant ASA, krill oil

## 2023-06-26 NOTE — Telephone Encounter (Signed)
Thank you :)

## 2023-06-26 NOTE — Assessment & Plan Note (Addendum)
?  CHF due to URI Obtain 2 D ECHO F/u w/Cardiology - pending Lasix/Kdur prn Prevnar 20 next time OV

## 2023-06-26 NOTE — Assessment & Plan Note (Signed)
Pantoprazole to 20 mg/d

## 2023-06-26 NOTE — Assessment & Plan Note (Signed)
Change to Symbicort bid Advair is not covered

## 2023-07-05 ENCOUNTER — Ambulatory Visit (INDEPENDENT_AMBULATORY_CARE_PROVIDER_SITE_OTHER): Payer: Medicare Other

## 2023-07-05 VITALS — BP 100/60 | HR 105 | Resp 20 | Ht 61.0 in | Wt 97.6 lb

## 2023-07-05 DIAGNOSIS — M810 Age-related osteoporosis without current pathological fracture: Secondary | ICD-10-CM | POA: Diagnosis not present

## 2023-07-05 MED ORDER — ROMOSOZUMAB-AQQG 105 MG/1.17ML ~~LOC~~ SOSY: 210.0000 mg | PREFILLED_SYRINGE | Freq: Once | SUBCUTANEOUS | Status: DC

## 2023-07-05 NOTE — Progress Notes (Signed)
After obtaining consent, and per orders of Dr. Lonzo Cloud, injection of Evenity given by Tera Partridge. Patient instructed to remain in clinic for 20 minutes afterwards, and to report any adverse reaction to me immediately.

## 2023-07-09 ENCOUNTER — Other Ambulatory Visit (HOSPITAL_COMMUNITY): Payer: Self-pay

## 2023-07-11 ENCOUNTER — Ambulatory Visit: Payer: Medicare Other | Attending: Physician Assistant | Admitting: Physician Assistant

## 2023-07-11 VITALS — BP 122/70 | HR 99 | Ht 61.0 in | Wt 98.0 lb

## 2023-07-11 DIAGNOSIS — I951 Orthostatic hypotension: Secondary | ICD-10-CM

## 2023-07-11 DIAGNOSIS — I2510 Atherosclerotic heart disease of native coronary artery without angina pectoris: Secondary | ICD-10-CM

## 2023-07-11 DIAGNOSIS — R0600 Dyspnea, unspecified: Secondary | ICD-10-CM | POA: Diagnosis not present

## 2023-07-11 DIAGNOSIS — Z79899 Other long term (current) drug therapy: Secondary | ICD-10-CM | POA: Diagnosis not present

## 2023-07-11 NOTE — Patient Instructions (Signed)
 Medication Instructions:  Your physician recommends that you continue on your current medications as directed. Please refer to the Current Medication list given to you today.  *If you need a refill on your cardiac medications before your next appointment, please call your pharmacy*   Lab Work: BMET, BNP If you have labs (blood work) drawn today and your tests are completely normal, you will receive your results only by: MyChart Message (if you have MyChart) OR A paper copy in the mail If you have any lab test that is abnormal or we need to change your treatment, we will call you to review the results.  Follow-Up: At [facility], you and your health needs are our priority.  As part of our continuing mission to provide you with exceptional heart care, we have created designated Provider Care Teams.  These Care Teams include your primary Cardiologist (physician) and Advanced Practice Providers (APPs -  Physician Assistants and Nurse Practitioners) who all work together to provide you with the care you need, when you need it.   Your next appointment:    Jan 22nd at 8:50am   Provider:   Hao Meng, PA

## 2023-07-11 NOTE — Progress Notes (Signed)
 Cardiology Office Note:  .   Date:  07/11/2023  ID:  Cindy Meza, DOB 1936-01-02, MRN [id number] PCP: Garald [name], MD  [facility] Providers Cardiologist:  Vina Gull, MD     History of Present Illness: .   Cindy Meza is a 88 y.o. female with past medical history of fatigue, orthostatic intolerance, mitral regurgitation, asthma, GERD, and aortic atherosclerosis seen on previous CT of abdomen.  Myoview  in 2019 showed no ischemia, EF down the previously okay.  Echocardiogram in 2019 showed EF 50 to 55%, mild diastolic dysfunction, moderate MR.  She did not tolerate rosuvastatin  due to constipation.  She is very sensitive to several medication.  She was recently seen by Dr. Gull in November 2024 at which time she was doing well.  She is on Florinef  for orthostatic hypotension.  She was seen in the ED on 06/18/2023 due to shortness of breath with exertion.  Her PCP has prescribed albuterol  and a Z-Pak due to concern for asthma versus COPD flare.  She had minimal relief.  She also complains of worsening bilateral extremity edema.  Chest x-ray showed bilateral lower lobe effusion.  BNP 3167.  Serial troponin 13--> 20.  She was offered admission, however she declined.  She presents today for posthospital follow-up.  Since discharge, patient has been seen by her PCP who ordered echocardiogram and recommended cardiology follow-up.  Patient presents today for follow-up.  Her breathing has improved after starting on the Lasix  however still not back to her baseline.  On physical exam I do not see any lower extremity edema.  She is currently scheduled to have a echocardiogram on 1/16, given the elevated BNP, it is likely that her echocardiogram will not be normal.  I recommended continue Lasix  as needed.  Unfortunately with her history of orthostatic hypotension, we cannot be very aggressive with diuresis.  I am hoping to find a good balance with her volume and orthostatic dizziness.   Depend on the result of her echocardiogram, we may need to do additional ischemic workup.  ROS:   Patient has been having shortness of breath and leg edema.  She denies any chest pain.  Studies Reviewed: SABRA   EKG Interpretation Date/Time:  Thursday July 11 2023 10:43:17 EST Ventricular Rate:  99 PR Interval:  104 QRS Duration:  90 QT Interval:  352 QTC Calculation: 451 R Axis:   48  Text Interpretation: Sinus rhythm with short PR with Premature atrial complexes Left ventricular hypertrophy T wave inversion in the lateral leads Poor R wave progression in the anterior leads Confirmed by Janene Boer [phone]) on 07/11/2023 9:41:15 PM    Cardiac Studies & Procedures     STRESS TESTS  MYOCARDIAL PERFUSION IMAGING 06/26/2018  Narrative  Nuclear stress EF: 41%.  There was no ST segment deviation noted during stress.  The left ventricular ejection fraction is moderately decreased (30-44%).  This is a low risk study.  1. EF calculated as 41% but not sure gating was accurate.  Would suggest confirmation by echo. 2. No evidence for ischemia or infarction on perfusion images.  Overall, low risk study.  ECHOCARDIOGRAM  ECHOCARDIOGRAM COMPLETE 05/20/2018  Narrative *[name]* [address] [place], [place] [address] [phone]  ------------------------------------------------------------------- Transthoracic Echocardiography  Patient:    Cindy, Meza MR #:       [id number] Study Date: 05/20/2018 Gender:     F Age:        82 Height:  157.5 cm Weight:     53.5 kg BSA:        1.53 m^2 Pt. Status: Room:  SONOGRAPHER  Celestia Will PERFORMING   [facility], Outpatient ATTENDING    Windle Roanne TISA [place], Janine BARTON [place], [place]  cc:  ------------------------------------------------------------------- LV EF: 50% -   55%  ------------------------------------------------------------------- Indications:       (I34.0).  ------------------------------------------------------------------- History:   PMH:  Acquired from the patient and from the patient&'s chart.  Dyspnea.  Mitral regurgitation.  Risk factors:  Former tobacco use. Dyslipidemia.  ------------------------------------------------------------------- Study Conclusions  - Left ventricle: The cavity size was normal. Systolic function was normal. The estimated ejection fraction was in the range of 50% to 55%. Wall motion was grossly normal, though frequent ectopy limits sensitivity. In some views, septum appears mildly hypokinetic. The study is not technically sufficient to allow evaluation of LV diastolic function. - Aortic valve: There was mild to moderate regurgitation. Regurgitation pressure half-time: 357 ms. - Mitral valve: There was mild regurgitation. - Atrial septum: A septal defect cannot be excluded. There was an atrial septal aneurysm, predominantly within the right atrial cavity. - Tricuspid valve: There was mild regurgitation. - Pulmonic valve: There was no significant regurgitation. - Pulmonary arteries: Systolic pressure was within the normal range.  Impressions:  - Frequent ectopy limits sensitivity. Grossly normal LVEF and wall motion, though septum appears mildly hypokinetic in some views. Aortic valve with mild (by jet) to moderate (by gradient) regurgitation.  ------------------------------------------------------------------- Study data:  Comparison was made to the study of 04/20/2016.  Study status:  Routine.  Procedure:  The patient reported no pain pre or post test. Transthoracic echocardiography for left ventricular function evaluation and for assessment of valvular function. Image quality was adequate.  Study completion:  There were no complications.          Transthoracic echocardiography.  M-mode, complete 2D, spectral Doppler, and color Doppler.  Birthdate: Patient birthdate: Dec 06, 1935.  Age:   Patient is 88 yr old.  Sex: Gender: female.    BMI: 21.6 kg/m^2.  Blood pressure:     128/76 Patient status:  Outpatient.  Study date:  Study date: 05/20/2018. Study time: 03:07 PM.  Location:  [facility]  -------------------------------------------------------------------  ------------------------------------------------------------------- Left ventricle:  The cavity size was normal. Systolic function was normal. The estimated ejection fraction was in the range of 50% to 55%. Wall motion was grossly normal, though frequent ectopy limits sensitivity. In some views, septum appears mildly hypokinetic. The study is not technically sufficient to allow evaluation of LV diastolic function.  ------------------------------------------------------------------- Aortic valve:   Trileaflet; normal thickness leaflets. Mobility was not restricted.  Doppler:  Transvalvular velocity was within the normal range. There was no stenosis. There was mild to moderate regurgitation.  ------------------------------------------------------------------- Aorta:  Aortic root: The aortic root was normal in size.  ------------------------------------------------------------------- Mitral valve:   Structurally normal valve.   Mobility was not restricted.  Doppler:  Transvalvular velocity was within the normal range. There was no evidence for stenosis. There was mild regurgitation.    Valve area by pressure half-time: 2.2 cm^2. Indexed valve area by pressure half-time: 1.43 cm^2/m^2.  ------------------------------------------------------------------- Left atrium:  The atrium was normal in size.  ------------------------------------------------------------------- Atrial septum:  A septal defect cannot be excluded. There was an atrial septal aneurysm, predominantly within the right atrial cavity.  ------------------------------------------------------------------- Right ventricle:  The cavity size was  normal. Wall thickness was normal. Systolic function was normal.  -------------------------------------------------------------------  Pulmonic valve:   Poorly visualized.  The valve appears to be grossly normal.    Doppler:  Transvalvular velocity was within the normal range. There was no evidence for stenosis. There was no significant regurgitation.  ------------------------------------------------------------------- Tricuspid valve:   Structurally normal valve.    Doppler: Transvalvular velocity was within the normal range. There was mild regurgitation.  ------------------------------------------------------------------- Pulmonary artery:   The main pulmonary artery was normal-sized. Systolic pressure was within the normal range.  ------------------------------------------------------------------- Right atrium:  The atrium was normal in size.  ------------------------------------------------------------------- Pericardium:  There was no pericardial effusion.  ------------------------------------------------------------------- Systemic veins: Inferior vena cava: The vessel was normal in size.  ------------------------------------------------------------------- Measurements  Left ventricle                           Value          Reference LV ID, ED, PLAX chordal                  49    mm       43 - 52 LV ID, ES, PLAX chordal                  36    mm       23 - 38 LV fx shortening, PLAX chordal   (L)     27    %        >=29 LV PW thickness, ED                      11    mm       ---------- IVS/LV PW ratio, ED                      0.91           <=1.3 Stroke volume, 2D                        63    ml       ---------- Stroke volume/bsa, 2D                    41    ml/m^2   ----------  Ventricular septum                       Value          Reference IVS thickness, ED                        10    mm       ----------  LVOT                                     Value           Reference LVOT ID, S                               23    mm       ---------- LVOT area                                4.15  cm^2     ----------  LVOT ID                                  23    mm       ---------- LVOT peak velocity, S                    72.7  cm/s     ---------- LVOT mean velocity, S                    47.5  cm/s     ---------- LVOT VTI, S                              15.2  cm       ---------- Stroke volume (SV), LVOT DP              63.2  ml       ---------- Stroke index (SV/bsa), LVOT DP           41.2  ml/m^2   ----------  Aortic valve                             Value          Reference Aortic regurg pressure half-time         357   ms       ----------  Aorta                                    Value          Reference Aortic root ID, ED                       39    mm       ---------- Ascending aorta ID, A-P, S               32    mm       ----------  Left atrium                              Value          Reference LA ID, A-P, ES                           32    mm       ---------- LA ID/bsa, A-P                           2.09  cm/m^2   <=2.2 LA volume, S                             40.7  ml       ---------- LA volume/bsa, S                         26.5  ml/m^2   ---------- LA volume, ES, 1-p A4C                   33.5  ml       ---------- LA volume/bsa, ES, 1-p A4C               21.8  ml/m^2   ---------- LA volume, ES, 1-p A2C                   45.4  ml       ---------- LA volume/bsa, ES, 1-p A2C               29.6  ml/m^2   ----------  Mitral valve                             Value          Reference Mitral E-wave peak velocity              59.1  cm/s     ---------- Mitral A-wave peak velocity              88.6  cm/s     ---------- Mitral deceleration time         (H)     309   ms       150 - 230 Mitral pressure half-time                90    ms       ---------- Mitral E/A ratio, peak                   0.6            ---------- Mitral valve area, PHT, DP                2.2   cm^2     ---------- Mitral valve area/bsa, PHT, DP           1.43  cm^2/m^2 ----------  Pulmonary arteries                       Value          Reference PA pressure, S, DP                       24    mm Hg    <=30  Tricuspid valve                          Value          Reference Tricuspid regurg peak velocity           227   cm/s     ---------- Tricuspid peak RV-RA gradient            21    mm Hg    ----------  Right atrium                             Value          Reference RA ID, S-I, ES, A4C                      45.9  mm       34 - 49 RA area, ES, A4C                         13.2  cm^2     8.3 -  19.5 RA volume, ES, A/L                       31.5  ml       ---------- RA volume/bsa, ES, A/L                   20.5  ml/m^2   ----------  Systemic veins                           Value          Reference Estimated CVP                            3     mm Hg    ----------  Right ventricle                          Value          Reference RV pressure, S, DP                       24    mm Hg    <=30  Legend: (L)  and  (H)  mark values outside specified reference range.  ------------------------------------------------------------------- Prepared and Electronically Authenticated by  Lonni Slain 2019-11-12T18:04:33     CARDIAC MRI  MR CARDIAC MORPHOLOGY W WO CONTRAST 09/16/2006  Narrative CARDIAC MRI: Ms. Wenk is a 88 year old patient of Dr. Garald and myself. She has a history of PACs and palpitations. She had an echocardiogram suggesting previous inferior wall MI with an EF of 50-55%. This study was done to reassess LV function and rule out scar tissue and coronary disease. Protocol: The patient was scanned on the 1.5 Tesla GE magnet. A dedicated cardiac coil was used. Functional imaging was performed using Fiesta sequences. True short axis imaging from base to apex was done for quantification of ejection fraction. Two, three, and four chamber views were also  done to assess for regional wall motion abnormalities. The patient received 0.2 mg/kg of gadolinium. A total of 35 cc of Magnevist was given. After 10 minutes hyperenhancement imaging using inversion recovery sequence was performed. Findings: The left ventricular cavity size is mildly dilated. There was no significant left ventricular hypertrophy. There was some abnormal septal motion and dysenergy to the LV motion. The quantitative ejection fraction was 50% and there was no thinning of the myocardium and no evidence of a discrete wall motion abnormality. There was mild left atrial enlargement. Right-sided cardiac chambers were normal. There was no ASD and no VSD. The aortic valve appeared trileaflet. There was probable mild mitral insufficiency and mild aortic insufficiency. Hyperenhancement imaging showed no evidence of scar tissue.  Impression 1. Global hypokinesis with septal dysenergy likely related to a form of nonischemic cardiomyopathy or related to the patient's frequent PACS. Gated ejection fraction is 50%. 2. No evidence of hyperenhancement or scar tissue. No evidence of previous myocardial infarction. 3. Probable mild aortic insufficiency. Suggest echo correlation. 4. Mild left atrial enlargement. The patient tolerated the procedure well. To be billed by [facility] as [phone] cardiac MRI function and morphology and gadolinium. Done in conjunction with Dr. Marcey Slain from [facility].  Provider: Tilford Mangle         Risk Assessment/Calculations:  Physical Exam:   VS:  BP 122/70 (BP Location: Left Arm, Patient Position: Sitting, Cuff Size: Normal)   Pulse 99   Ht 5' 1 (1.549 m)   Wt 98 lb (44.5 kg)   SpO2 93%   BMI 18.52 kg/m    Wt Readings from Last 3 Encounters:  07/11/23 98 lb (44.5 kg)  07/05/23 97 lb 9.6 oz (44.3 kg)  06/26/23 98 lb (44.5 kg)    GEN: Well nourished, well developed in no acute distress NECK: No JVD; No carotid  bruits CARDIAC: RRR, no murmurs, rubs, gallops RESPIRATORY:  Clear to auscultation without rales, wheezing or rhonchi  ABDOMEN: Soft, non-tender, non-distended EXTREMITIES:  No edema; No deformity   ASSESSMENT AND PLAN: .     Shortness of Breath Recent ED visit on 06/18/2023 for shortness of breath. Symptoms improved but not back to baseline after starting PRN Lasix . No current leg edema noted on physical exam. High BNP level noted during hospital visit, suggesting possible cardiac component. -Continue Lasix  as needed for shortness of breath or significant swelling.  Hesitant to change Lasix  to scheduled medication given history of orthostatic hypotension. -Order echocardiogram to evaluate cardiac function. -Order repeat BNP and basic metabolic panel to monitor kidney function and confirm BNP level is decreasing. -Use rescue inhaler as needed for shortness of breath. -Follow-up in 2-3 weeks after echocardiogram.  Orthostatic Hypotension History of orthostatic hypotension. Currently on Fludrocortisone . Concern for exacerbation if Lasix  is used daily. -Continue Fludrocortisone  as prescribed. -Use Lasix  as needed, not daily, to avoid exacerbating orthostatic hypotension. -Ensure adequate hydration while taking Lasix .  Aortic Atherosclerosis Chronic conditions noted in medical history. No acute issues discussed in this visit. -Continue current management as prescribed by PCP. -Pending echocardiogram.  Depend on the result of the echocardiogram, may need additional ischemic workup.       Dispo: Follow-up in 2 to 3 weeks.  Signed, Scot Ford, PA

## 2023-07-12 LAB — BASIC METABOLIC PANEL
BUN/Creatinine Ratio: 15 (ref 12–28)
BUN: 16 mg/dL (ref 8–27)
CO2: 23 mmol/L (ref 20–29)
Calcium: 10 mg/dL (ref 8.7–10.3)
Chloride: 103 mmol/L (ref 96–106)
Creatinine, Ser: 1.06 mg/dL — ABNORMAL HIGH (ref 0.57–1.00)
Glucose: 106 mg/dL — ABNORMAL HIGH (ref 70–99)
Potassium: 4.1 mmol/L (ref 3.5–5.2)
Sodium: 141 mmol/L (ref 134–144)
eGFR: 51 mL/min/{1.73_m2} — ABNORMAL LOW (ref >59)

## 2023-07-12 LAB — BRAIN NATRIURETIC PEPTIDE: BNP: 2916.1 pg/mL — ABNORMAL HIGH (ref 0.0–100.0)

## 2023-07-14 NOTE — Telephone Encounter (Signed)
 Prior Authorization REQUIRED for [facility]   PA PROCESS DETAILS: Effective 07/09/2021 if the patient is new to [facility] , Prior  authorization and Step Therapy are required & not on file. Please go to  [email or web address] or call [phone] to initiate the prior authorization.   For exception to the policy please visit  [email or web address] and review Policy  Number [email or web address]

## 2023-07-14 NOTE — Telephone Encounter (Signed)
Prior auth for [facility] APPROVED  PA# [id number] Valid: 01/06/23-01/06/24

## 2023-07-24 ENCOUNTER — Other Ambulatory Visit: Payer: Self-pay | Admitting: Internal Medicine

## 2023-07-25 ENCOUNTER — Ambulatory Visit (HOSPITAL_COMMUNITY): Payer: Medicare Other | Attending: Internal Medicine

## 2023-07-25 DIAGNOSIS — R0609 Other forms of dyspnea: Secondary | ICD-10-CM | POA: Diagnosis present

## 2023-07-26 LAB — ECHOCARDIOGRAM COMPLETE
Est EF: 25
MV M vel: 4.66 m/s
MV Peak grad: 86.9 mm[Hg]
Radius: 0.5 cm
S' Lateral: 4.96 cm

## 2023-07-29 ENCOUNTER — Telehealth: Payer: Self-pay | Admitting: Internal Medicine

## 2023-07-29 NOTE — Telephone Encounter (Unsigned)
Copied from CRM [id number]. Topic: General - Other >> Jul 29, 2023  3:46 PM Pascal Lux wrote: Reason for CRM: Patient called stated she just spoke with Dr. Posey Rea nurse and is requesting a call back from her.

## 2023-07-30 ENCOUNTER — Other Ambulatory Visit: Payer: Self-pay

## 2023-07-30 ENCOUNTER — Encounter (HOSPITAL_COMMUNITY): Payer: Self-pay

## 2023-07-30 ENCOUNTER — Emergency Department (HOSPITAL_COMMUNITY): Payer: Medicare Other

## 2023-07-30 ENCOUNTER — Inpatient Hospital Stay (HOSPITAL_COMMUNITY)
Admission: EM | Admit: 2023-07-30 | Discharge: 2023-08-03 | DRG: 286 | Disposition: A | Discharge status: Home / Self Care | Payer: Medicare Other | Attending: Internal Medicine | Admitting: Internal Medicine

## 2023-07-30 DIAGNOSIS — Z7951 Long term (current) use of inhaled steroids: Secondary | ICD-10-CM | POA: Diagnosis not present

## 2023-07-30 DIAGNOSIS — Z833 Family history of diabetes mellitus: Secondary | ICD-10-CM | POA: Diagnosis not present

## 2023-07-30 DIAGNOSIS — F419 Anxiety disorder, unspecified: Secondary | ICD-10-CM | POA: Diagnosis present

## 2023-07-30 DIAGNOSIS — I3139 Other pericardial effusion (noninflammatory): Secondary | ICD-10-CM | POA: Diagnosis present

## 2023-07-30 DIAGNOSIS — M545 Low back pain, unspecified: Secondary | ICD-10-CM | POA: Diagnosis present

## 2023-07-30 DIAGNOSIS — J4520 Mild intermittent asthma, uncomplicated: Secondary | ICD-10-CM

## 2023-07-30 DIAGNOSIS — J9601 Acute respiratory failure with hypoxia: Secondary | ICD-10-CM | POA: Diagnosis present

## 2023-07-30 DIAGNOSIS — Z79899 Other long term (current) drug therapy: Secondary | ICD-10-CM | POA: Diagnosis not present

## 2023-07-30 DIAGNOSIS — K219 Gastro-esophageal reflux disease without esophagitis: Secondary | ICD-10-CM | POA: Diagnosis present

## 2023-07-30 DIAGNOSIS — I5023 Acute on chronic systolic (congestive) heart failure: Principal | ICD-10-CM | POA: Diagnosis present

## 2023-07-30 DIAGNOSIS — J45909 Unspecified asthma, uncomplicated: Secondary | ICD-10-CM | POA: Diagnosis present

## 2023-07-30 DIAGNOSIS — N3281 Overactive bladder: Secondary | ICD-10-CM | POA: Diagnosis present

## 2023-07-30 DIAGNOSIS — I2510 Atherosclerotic heart disease of native coronary artery without angina pectoris: Secondary | ICD-10-CM | POA: Diagnosis present

## 2023-07-30 DIAGNOSIS — G4700 Insomnia, unspecified: Secondary | ICD-10-CM | POA: Diagnosis present

## 2023-07-30 DIAGNOSIS — I447 Left bundle-branch block, unspecified: Secondary | ICD-10-CM | POA: Diagnosis present

## 2023-07-30 DIAGNOSIS — Z681 Body mass index (BMI) 19 or less, adult: Secondary | ICD-10-CM

## 2023-07-30 DIAGNOSIS — Z881 Allergy status to other antibiotic agents status: Secondary | ICD-10-CM

## 2023-07-30 DIAGNOSIS — N1831 Chronic kidney disease, stage 3a: Secondary | ICD-10-CM | POA: Diagnosis present

## 2023-07-30 DIAGNOSIS — I340 Nonrheumatic mitral (valve) insufficiency: Secondary | ICD-10-CM | POA: Diagnosis present

## 2023-07-30 DIAGNOSIS — Z91013 Allergy to seafood: Secondary | ICD-10-CM

## 2023-07-30 DIAGNOSIS — Z7982 Long term (current) use of aspirin: Secondary | ICD-10-CM

## 2023-07-30 DIAGNOSIS — Z90710 Acquired absence of both cervix and uterus: Secondary | ICD-10-CM

## 2023-07-30 DIAGNOSIS — I5021 Acute systolic (congestive) heart failure: Secondary | ICD-10-CM | POA: Diagnosis not present

## 2023-07-30 DIAGNOSIS — E876 Hypokalemia: Secondary | ICD-10-CM | POA: Diagnosis present

## 2023-07-30 DIAGNOSIS — Z7952 Long term (current) use of systemic steroids: Secondary | ICD-10-CM

## 2023-07-30 DIAGNOSIS — Z884 Allergy status to anesthetic agent status: Secondary | ICD-10-CM

## 2023-07-30 DIAGNOSIS — I428 Other cardiomyopathies: Secondary | ICD-10-CM | POA: Diagnosis not present

## 2023-07-30 DIAGNOSIS — D631 Anemia in chronic kidney disease: Secondary | ICD-10-CM | POA: Diagnosis present

## 2023-07-30 DIAGNOSIS — Z888 Allergy status to other drugs, medicaments and biological substances status: Secondary | ICD-10-CM

## 2023-07-30 DIAGNOSIS — I951 Orthostatic hypotension: Secondary | ICD-10-CM | POA: Diagnosis present

## 2023-07-30 DIAGNOSIS — I509 Heart failure, unspecified: Principal | ICD-10-CM

## 2023-07-30 DIAGNOSIS — D638 Anemia in other chronic diseases classified elsewhere: Secondary | ICD-10-CM | POA: Diagnosis present

## 2023-07-30 DIAGNOSIS — E43 Unspecified severe protein-calorie malnutrition: Secondary | ICD-10-CM

## 2023-07-30 DIAGNOSIS — R0602 Shortness of breath: Secondary | ICD-10-CM | POA: Diagnosis present

## 2023-07-30 DIAGNOSIS — I700 Atherosclerosis of aorta: Secondary | ICD-10-CM | POA: Diagnosis present

## 2023-07-30 DIAGNOSIS — I272 Pulmonary hypertension, unspecified: Secondary | ICD-10-CM | POA: Diagnosis present

## 2023-07-30 DIAGNOSIS — Z7984 Long term (current) use of oral hypoglycemic drugs: Secondary | ICD-10-CM | POA: Diagnosis not present

## 2023-07-30 DIAGNOSIS — Z8249 Family history of ischemic heart disease and other diseases of the circulatory system: Secondary | ICD-10-CM

## 2023-07-30 LAB — CBC WITH DIFFERENTIAL/PLATELET
Abs Immature Granulocytes: 0.02 K/uL (ref 0.00–0.07)
Basophils Absolute: 0 K/uL (ref 0.0–0.1)
Basophils Relative: 0 %
Eosinophils Absolute: 0 K/uL (ref 0.0–0.5)
Eosinophils Relative: 1 %
HCT: 36.3 % (ref 36.0–46.0)
Hemoglobin: 11.6 g/dL — ABNORMAL LOW (ref 12.0–15.0)
Immature Granulocytes: 0 %
Lymphocytes Relative: 12 %
Lymphs Abs: 0.9 K/uL (ref 0.7–4.0)
MCH: 31 pg (ref 26.0–34.0)
MCHC: 32 g/dL (ref 30.0–36.0)
MCV: 97.1 fL (ref 80.0–100.0)
Monocytes Absolute: 0.4 K/uL (ref 0.1–1.0)
Monocytes Relative: 5 %
Neutro Abs: 6.1 K/uL (ref 1.7–7.7)
Neutrophils Relative %: 82 %
Platelets: 141 K/uL — ABNORMAL LOW (ref 150–400)
RBC: 3.74 MIL/uL — ABNORMAL LOW (ref 3.87–5.11)
RDW: 13.4 % (ref 11.5–15.5)
WBC: 7.4 K/uL (ref 4.0–10.5)
nRBC: 0 % (ref 0.0–0.2)

## 2023-07-30 LAB — MAGNESIUM: Magnesium: 2.3 mg/dL (ref 1.7–2.4)

## 2023-07-30 LAB — TROPONIN I (HIGH SENSITIVITY)
Troponin I (High Sensitivity): 18 ng/L — ABNORMAL HIGH (ref <18)
Troponin I (High Sensitivity): 23 ng/L — ABNORMAL HIGH (ref <18)

## 2023-07-30 LAB — BASIC METABOLIC PANEL WITH GFR
Anion gap: 10 (ref 5–15)
BUN: 16 mg/dL (ref 8–23)
CO2: 21 mmol/L — ABNORMAL LOW (ref 22–32)
Calcium: 9.1 mg/dL (ref 8.9–10.3)
Chloride: 107 mmol/L (ref 98–111)
Creatinine, Ser: 1.18 mg/dL — ABNORMAL HIGH (ref 0.44–1.00)
GFR, Estimated: 45 mL/min — ABNORMAL LOW
Glucose, Bld: 121 mg/dL — ABNORMAL HIGH (ref 70–99)
Potassium: 3.6 mmol/L (ref 3.5–5.1)
Sodium: 138 mmol/L (ref 135–145)

## 2023-07-30 LAB — BRAIN NATRIURETIC PEPTIDE: B Natriuretic Peptide: 4411.4 pg/mL — ABNORMAL HIGH (ref 0.0–100.0)

## 2023-07-30 MED ORDER — HEPARIN SODIUM (PORCINE) 5000 UNIT/ML IJ SOLN
5000.0000 [IU] | Freq: Three times a day (TID) | INTRAMUSCULAR | Status: DC
  Administered 2023-07-30 – 2023-08-03 (×8): 5000 [IU] via SUBCUTANEOUS
  Filled 2023-07-30 (×8): qty 1

## 2023-07-30 MED ORDER — FLUDROCORTISONE ACETATE 0.1 MG PO TABS
0.0500 mg | ORAL_TABLET | Freq: Two times a day (BID) | ORAL | Status: DC
  Filled 2023-07-30: qty 0.5

## 2023-07-30 MED ORDER — PANTOPRAZOLE SODIUM 40 MG PO TBEC: 40.0000 mg | DELAYED_RELEASE_TABLET | Freq: Every day | ORAL | Status: DC

## 2023-07-30 MED ORDER — ASPIRIN 81 MG PO TBEC
81.0000 mg | DELAYED_RELEASE_TABLET | Freq: Every day | ORAL | Status: DC
  Administered 2023-08-02 – 2023-08-03 (×2): 81 mg via ORAL
  Filled 2023-07-30 (×3): qty 1

## 2023-07-30 MED ORDER — ACETAMINOPHEN 325 MG PO TABS: 650.0000 mg | ORAL_TABLET | Freq: Four times a day (QID) | ORAL | Status: DC | PRN

## 2023-07-30 MED ORDER — FUROSEMIDE 10 MG/ML IJ SOLN
20.0000 mg | Freq: Two times a day (BID) | INTRAMUSCULAR | Status: DC
  Administered 2023-07-31 (×2): 20 mg via INTRAVENOUS
  Filled 2023-07-30 (×3): qty 2

## 2023-07-30 MED ORDER — ALBUTEROL SULFATE (2.5 MG/3ML) 0.083% IN NEBU
2.5000 mg | INHALATION_SOLUTION | RESPIRATORY_TRACT | Status: DC | PRN
  Administered 2023-07-30 – 2023-07-31 (×2): 2.5 mg via RESPIRATORY_TRACT
  Filled 2023-07-30 (×2): qty 3

## 2023-07-30 MED ORDER — PANTOPRAZOLE SODIUM 40 MG PO TBEC
40.0000 mg | DELAYED_RELEASE_TABLET | Freq: Every day | ORAL | Status: DC
  Administered 2023-07-30 – 2023-08-02 (×3): 40 mg via ORAL
  Filled 2023-07-30 (×3): qty 1

## 2023-07-30 MED ORDER — SODIUM CHLORIDE 0.9% FLUSH
3.0000 mL | Freq: Two times a day (BID) | INTRAVENOUS | Status: DC
  Administered 2023-07-30 – 2023-08-03 (×5): 3 mL via INTRAVENOUS

## 2023-07-30 MED ORDER — LORAZEPAM 0.5 MG PO TABS: 0.2500 mg | ORAL_TABLET | Freq: Every evening | ORAL | Status: DC | PRN

## 2023-07-30 MED ORDER — POLYETHYLENE GLYCOL 3350 17 G PO PACK: 17.0000 g | PACK | Freq: Every day | ORAL | Status: DC | PRN

## 2023-07-30 MED ORDER — FUROSEMIDE 10 MG/ML IJ SOLN
20.0000 mg | Freq: Once | INTRAMUSCULAR | Status: AC
  Administered 2023-07-30: 20 mg via INTRAVENOUS
  Filled 2023-07-30: qty 2

## 2023-07-30 MED ORDER — ACETAMINOPHEN 650 MG RE SUPP: 650.0000 mg | Freq: Four times a day (QID) | RECTAL | Status: DC | PRN

## 2023-07-30 MED ORDER — MOMETASONE FURO-FORMOTEROL FUM 100-5 MCG/ACT IN AERO
2.0000 | INHALATION_SPRAY | Freq: Two times a day (BID) | RESPIRATORY_TRACT | Status: DC
  Administered 2023-08-01 – 2023-08-03 (×4): 2 via RESPIRATORY_TRACT
  Filled 2023-07-30: qty 8.8

## 2023-07-30 NOTE — H&P (Signed)
History and Physical   Cindy Meza WUJ:[id number] DOB: 19-Nov-1935 DOA: 07/30/2023  PCP: Tresa Garter, MD   Patient coming from: Home  Chief Complaint: Shortness of breath  HPI: Cindy Meza is a 88 y.o. female with medical history significant of GERD, asthma, anemia, OAB, CKD 3A, orthostatic hypotension, low back pain, anxiety, insomnia presenting with worsening shortness of breath.  Patient has been recently undergoing workup for possible CHF.  She presented to the ED on 12/10 with shortness of breath with an elevated BNP and pleural effusions.  Admission was offered but patient declined and has since followed up with PCP and cardiology and had outpatient echocardiogram performed last couple days but has not seen cardiology since it was performed.  Initially was on Lasix as needed for edema and had been taking it more frequently recently but after initial improvement on this she has had worsening shortness of breath again.  Also with pedal edema.  For EMS, initially desaturated on ambulation and was noted to have heart rates in the 90s to 170s that were irregular, no significant tachycardia in the ED.  Denies fevers, chills, chest pain, abdominal pain, constipation, diarrhea, nausea, vomiting.  ED Course: Vital signs in the ED notable for blood pressure in the 130s systolic, respiratory rate in the 20s, reportedly desaturating with ambulation.  Lab workup included BMP with BUN 21, creatinine stable Chest x-ray showed small to moderate bilateral pleural effusions with cardiomegaly without overt edema.  Patient received 20 mg IV Lasix in the ED.  Troponin 18 with repeat pending.  CBC with hemoglobin stable 11.6, platelets 141.  BNP elevated to 4411.   1.18, glucose 121.  Magnesium normal.    Review of Systems: As per HPI otherwise all other systems reviewed and are negative.  Past Medical History:  Diagnosis Date   Abdominal pain 03/30/2015   Dr Leone Payor     /16, 1/21  ?fosamax related  S/p gyn eval, abd CT (-)  11/16 50% better  Better off Fosamax, off Miralax   12/22 Recurrent abd pain - pt saw Dr Leone Payor; CT w/constipation - using Miralax and Benefiber - better now     Allergy    Anemia    Asthma    Dr. Sherene Sires   Blood transfusion without reported diagnosis    Breast cyst    Left   Cataract    Dr. Nile Riggs   Cerumen impaction 04/06/2016   9/17 L, 10/19 B     Chest pressure 07/22/2012   1/14, 8/17  Recurrent - ?asthma related (11/17)  Advair MDI - use qd, not prn  We added Singulair  Cardiac w/up was (-) - --- Dr Tenny Craw     Closed right hip fracture (HCC) 06/12/2016   Dr Ave Filter  R hip fx - intermedullary nail 06/13/17 s/p   On Prolia, calcium, Vit D        Complication of anesthesia    ' I HAD HALLUCINATIONS WHEN THEY DID MY HIP "   GERD (gastroesophageal reflux disease)    Heart murmur    Hemorrhoids    History of colonic polyps Gessner   hx of adenomas (small) 1998   Hot flash, menopausal 02/15/2012   Ongoing   D/c estrogens due to breast abnormalities 2013     Nausea 02/12/2019   8/20 d/c Midodrin, Florinef  Occasional.   Nausea after taking Pantoprazole - take w/food     OA (osteoarthritis)    Orthostatic hypotension    Osteopenia  PAC (premature atrial contraction)    Upper respiratory infection 06/13/2023   Zpac if worse     Vitamin D deficiency     Past Surgical History:  Procedure Laterality Date   ABDOMINAL HYSTERECTOMY     complete   APPENDECTOMY     BREAST CYST EXCISION     CATARACT EXTRACTION, BILATERAL     04/23/18 left eye, 04/02/18 right eye   COLONOSCOPY  Multiple   Adenomatous colon polyps   ESOPHAGOGASTRODUODENOSCOPY  2010   INTRAMEDULLARY (IM) NAIL INTERTROCHANTERIC Right 06/13/2016   Procedure: INTRAMEDULLARY (IM) NAIL INTERTROCHANTRIC;  Surgeon: Jones Broom, MD;  Location: [facility] OR;  Service: Orthopedics;  Laterality: Right;   TONSILLECTOMY      Social History  reports that she has never smoked. She has never  used smokeless tobacco. She reports that she does not currently use alcohol. She reports that she does not use drugs.  Allergies  Allergen Reactions   Fosamax [Alendronate Sodium]     Paresthesias, abd pain   Gabapentin     dizziness   Lidocaine Swelling    Of face and lips.  Novocaine does same thing.   Midodrine     Nausea    Risedronate Sodium     Knee pain   Shellfish Allergy Hives   Statins     REACTION: pains    Family History  Problem Relation Age of Onset   Diabetes Mother 81       Deceased   Hypertension Other    Colon cancer Neg Hx    Pancreatic cancer Neg Hx    Stomach cancer Neg Hx    Esophageal cancer Neg Hx    Rectal cancer Neg Hx   Reviewed on admission  Prior to Admission medications   Medication Sig Start Date End Date Taking? Authorizing Provider  albuterol (PROVENTIL HFA) 108 (90 Base) MCG/ACT inhaler Inhale 1-2 puffs into the lungs every 4 (four) hours as needed for wheezing or shortness of breath. 06/13/23   [name], [name], MD  aspirin 81 MG tablet Take 81 mg by mouth daily.    [provider]  budesonide-formoterol (SYMBICORT) 80-4.5 MCG/ACT inhaler Inhale 2 puffs into the lungs 2 (two) times daily. 06/26/23   [name], [name], MD  Calcium-Magnesium-Vitamin D (CALCIUM 500 PO) Take 1 tablet by mouth 2 (two) times daily with a meal. With lunch and supper      cholecalciferol (VITAMIN D3) 25 MCG (1000 UNIT) tablet Take 1,000 Units by mouth daily.    [provider]  [employer] OMEGA-3 KRILL OIL 500 MG CAPS Take 500 capsules by mouth daily.    [provider]  Cyanocobalamin (VITAMIN B-12) 1000 MCG SUBL Place 1 tablet (1,000 mcg total) under the tongue in the morning. 05/03/21   Arnaldo Natal, NP  estradiol (ESTRACE) 0.1 MG/GM vaginal cream Place vaginally. 07/27/21   [provider]  fludrocortisone (FLORINEF) 0.1 MG tablet TAKE 0.5 TABLETS BY MOUTH TWICE DAILY AT 8AM AND 2PM 07/25/23   Pricilla Riffle, MD   furosemide (LASIX) 20 MG tablet Take 1 tablet (20 mg total) by mouth daily as needed for edema. 06/26/23   [name], [name], MD  LORazepam (ATIVAN) 0.5 MG tablet Take 0.5-1 tablets (0.25-0.5 mg total) by mouth at bedtime as needed for anxiety (insomnia, overactive bladder). 03/27/23   [name], [name], MD  pantoprazole (PROTONIX) 40 MG tablet Take 1 tablet (40 mg total) by mouth daily. 06/13/23   [name], [name], MD  polyethylene  glycol (MIRALAX / GLYCOLAX) 17 g packet Take 17 g by mouth daily.    [provider]  potassium chloride (KLOR-CON) 10 MEQ tablet Take 1 tablet (10 mEq total) by mouth daily as needed. 06/26/23   [name], [name], MD  Romosozumab-aqqg (EVENITY) 105 MG/1. SOSY injection Inject 210 mg into the skin every 30 (thirty) days.    [provider]  Wheat Dextrin (BENEFIBER DRINK MIX PO) Take 1 Capful by mouth daily.    [provider]    Physical Exam: Vitals:   07/30/23 1101 07/30/23 1115 07/30/23 1118  BP:  (!) 133/104   Pulse:  91   Resp:  (!) 23   Temp:  98.2 F (36.8 C)   TempSrc:  Oral   SpO2: 96% 96%   Weight:   44.5 kg  Height:   5\' 1"  (1.549 m)    Physical Exam Constitutional:      General: She is not in acute distress.    Appearance: Normal appearance.  HENT:     Head: Normocephalic and atraumatic.     Mouth/Throat:     Mouth: Mucous membranes are moist.     Pharynx: Oropharynx is clear.  Eyes:     Extraocular Movements: Extraocular movements intact.     Pupils: Pupils are equal, round, and reactive to light.  Cardiovascular:     Rate and Rhythm: Tachycardia present. Rhythm irregular.     Pulses: Normal pulses.     Heart sounds: Normal heart sounds.  Pulmonary:     Effort: Pulmonary effort is normal. No respiratory distress.     Breath sounds: Examination of the right-lower field reveals decreased breath sounds. Examination of the left-lower field reveals decreased breath sounds. Decreased breath  sounds present.  Abdominal:     General: Bowel sounds are normal. There is no distension.     Palpations: Abdomen is soft.     Tenderness: There is no abdominal tenderness.  Musculoskeletal:        General: No swelling or deformity.  Skin:    General: Skin is warm and dry.  Neurological:     General: No focal deficit present.     Mental Status: Mental status is at baseline.    Labs on Admission: I have personally reviewed following labs and imaging studies  CBC: Recent Labs  Lab 07/30/23 1200  WBC 7.4  NEUTROABS 6.1  HGB 11.6*  HCT 36.3  MCV 97.1  PLT 141*    Basic Metabolic Panel: Recent Labs  Lab 07/30/23 1200  NA 138  K 3.6  CL 107  CO2 21*  GLUCOSE 121*  BUN 16  CREATININE 1.18*  CALCIUM 9.1  MG 2.3    GFR: Estimated Creatinine Clearance: 24.5 mL/min (A) (by C-G formula based on SCr of 1.18 mg/dL (H)).  Liver Function Tests: No results for input(s): "AST", "ALT", "ALKPHOS", "BILITOT", "PROT", "ALBUMIN" in the last 168 hours.  Urine analysis:    Component Value Date/Time   COLORURINE YELLOW 03/27/2023 1516   APPEARANCEUR CLEAR 03/27/2023 1516   APPEARANCEUR Clear 03/23/2019 1248   LABSPEC 1.015 03/27/2023 1516   PHURINE 6.0 03/27/2023 1516   GLUCOSEU NEGATIVE 03/27/2023 1516   HGBUR NEGATIVE 03/27/2023 1516   BILIRUBINUR NEGATIVE 03/27/2023 1516   BILIRUBINUR Negative 03/23/2019 1248   KETONESUR TRACE (A) 03/27/2023 1516   PROTEINUR NEGATIVE 03/10/2021 1524   UROBILINOGEN 0.2 03/27/2023 1516   NITRITE NEGATIVE 03/27/2023 1516   LEUKOCYTESUR NEGATIVE 03/27/2023 1516    Radiological Exams on  Admission: DG Chest Port 1 View Result Date: 07/30/2023 CLINICAL DATA:  88 year old female with shortness of breath. Atrial fibrillation. EXAM: PORTABLE CHEST 1 VIEW COMPARISON:  Chest radiographs 07/07/2023 and earlier. FINDINGS: Portable AP semi upright view at 1134 hours. Unresolved veiling bilateral lung base pleural effusions with increased fluid  tracking into the right minor fissure now. Underlying cardiomegaly. Stable visible mediastinal contours. Upper lung pulmonary vascularity appears stable to improved, no overt edema. No pneumothorax. No air bronchograms. No acute osseous abnormality identified. Paucity of visible bowel gas. IMPRESSION: 1. Ongoing small to moderate bilateral pleural effusions with increased fluid tracking into the right minor fissure since last month. 2. Cardiomegaly. No overt edema. No new cardiopulmonary abnormality. Electronically Signed   By: Odessa Fleming M.D.   On: 07/30/2023 11:56   EKG: Independently reviewed.  Sinus tachycardia 102 bpm.  Low voltage multiple leads.  Borderline left bundle branch block with QRS 123.  Nonspecific T wave changes.  QRS is more prolonged from previous with stable apparent repolarization abnormalities.  Assessment/Plan Principal Problem:   Acute systolic CHF (congestive heart failure) (HCC) Active Problems:   Asthma   GERD (gastroesophageal reflux disease)   Anxiety   Low back pain   Anemia of chronic disease   Stage 3a chronic kidney disease (CKD) (HCC)   Insomnia   OAB (overactive bladder)   Acute systolic CHF Severe mitral regurgitation Bilateral pleural effusions. ?Tachycardia > Patient presenting after recent outpatient workup for suspected CHF.  Continue worsening shortness of breath and edema despite taking more Lasix though not every day. > Echocardiogram 5 days ago showed newly reduced EF at 25-33%, global hypokinesis, indeterminate diastolic function, mildly reduced RV function, severe mitral regurgitation which was previously mild, small pericardial effusion. > Patient has not yet had a chance to follow-up with cardiology following echocardiogram and was due to follow-up tomorrow but due to worsening symptoms presented today. > Reportedly had episode of tachycardia that was irregular for EMS with heart rates in the 90s to 170s, but this has not been seen in the ED. >  Given new severely reduced EF with right heart involvement and severe mitral regurgitation with global hypokinesis and patient on Florinef for orthostatic hypotension have requested EP consult cardiology for further guidance on initial workup on etiology of CHF given her valvular disease, hypokinesis, and possible episodes of tachycardia. > In the ED BNP elevated to 2 4411, troponin 18 with repeat pending.  Magnesium normal.  Potassium normal.  Creatinine at baseline. > X-ray with small to moderate bilateral pleural effusions with cardiomegaly though no overt edema noted.  Presumed etiology of effusions is CHF. > Received 20 mg IV Lasix in the ED, with reported good output so far.  - Monitor on progressive unit overnight - Appreciate cardiology recommendations and assistance with this patient - Continue with gentle diuresis in the setting of history of orthostatic hypotension - Trend renal function and electrolytes - Strict I's and O's, daily weights  GERD - Continue PPI  Asthma - Replace home Symbicort formulary Dulera - Continue.  Albuterol  CKD 3A > Creatinine stable in the ED 1.18 - Trend renal function and electrolytes  Orthostatic hypotension > Florinef discontinued given new CHF  > Has adverse reaction of nausea listed to midodrine. - Will monitor without medication for now now, may need another trial of midodrine for blood pressure support  Anxiety Insomnia - Continue as needed low-dose Ativan nightly as needed  DVT prophylaxis: Heparin Code Status:   Full Family  Communication:  NOne  Disposition Plan:   Patient is from:  Home  Anticipated DC to:  Home  Anticipated DC date:  2 to 5 days  Anticipated DC barriers: None  Consults called:  Cardiology Admission status:  Inpatient, progressive  Severity of Illness: The appropriate patient status for this patient is INPATIENT. Inpatient status is judged to be reasonable and necessary in order to provide the required  intensity of service to ensure the patient's safety. The patient's presenting symptoms, physical exam findings, and initial radiographic and laboratory data in the context of their chronic comorbidities is felt to place them at high risk for further clinical deterioration. Furthermore, it is not anticipated that the patient will be medically stable for discharge from the hospital within 2 midnights of admission.   * I certify that at the point of admission it is my clinical judgment that the patient will require inpatient hospital care spanning beyond 2 midnights from the point of admission due to high intensity of service, high risk for further deterioration and high frequency of surveillance required.Synetta Fail MD [facility]  How to contact the [facility] Attending or Consulting provider 7A - 7P or covering provider during after hours 7P -7A, for this patient?   Check the care team in [facility] and look for a) attending/consulting [facility] provider listed and b) the [facility] team listed Log into [email or web address] and use [facility] universal password to access. If you do not have the password, please contact the hospital operator. Locate the [facility] provider you are looking for under [facility] and page to a number that you can be directly reached. If you still have difficulty reaching the provider, please page the [facility] (Director on Call) for the Hospitalists listed on amion for assistance.  07/30/2023, 1:51 PM

## 2023-07-30 NOTE — Telephone Encounter (Signed)
Attempted to call pt back. No answer.

## 2023-07-30 NOTE — ED Triage Notes (Signed)
Pt to ED via [facility] from home. Pt was having shortness of breath, EMS noted afib.   140/70 HR 170s 95% RA

## 2023-07-30 NOTE — ED Provider Notes (Signed)
EMERGENCY DEPARTMENT AT [facility] Provider Note   CSN: [id number] Arrival date & time: 07/30/23  1044     History  Chief Complaint  Patient presents with   Shortness of Breath    Cindy Meza is a 88 y.o. female.  Patient is an 88 year old female with a past medical history of CHF, asthma, arthritis, orthostatic hypotension presenting to the emergency department with shortness of breath.  The patient reports that she has had increasing shortness of breath over the last week.  She states that she saw her primary doctor and had an outpatient echo and labs performed.  She states that she took her Lasix 2 days ago and normally takes this as needed for swelling in her feet.  She denies any associated chest pain.  She states that she has had a mild cough.  Denies any fever.  She states that she has had swelling in her feet and ankles.  She states that her primary doctor called her and told her that "her heart function looks worse".  On EMS arrival she was initially satting well on room air however when she stood up to walk to the stretcher and desatted to 88%.  They state that her heart rate was irregular between the 90s to 170s.  No meds were given and route.  The history is provided by the patient and the EMS personnel.  Shortness of Breath      Home Medications Prior to Admission medications   Medication Sig Start Date End Date Taking? Authorizing Provider  albuterol (PROVENTIL HFA) 108 (90 Base) MCG/ACT inhaler Inhale 1-2 puffs into the lungs every 4 (four) hours as needed for wheezing or shortness of breath. 06/13/23   [name], [name], MD  aspirin 81 MG tablet Take 81 mg by mouth daily.    [provider]  azithromycin (ZITHROMAX Z-PAK) 250 MG tablet As directed 06/13/23   [name], [name], MD  budesonide-formoterol (SYMBICORT) 80-4.5 MCG/ACT inhaler Inhale 2 puffs into the lungs 2 (two) times daily. 06/26/23   [name], [name], MD   Calcium-Magnesium-Vitamin D (CALCIUM 500 PO) Take 1 tablet by mouth 2 (two) times daily with a meal. With lunch and supper      cholecalciferol (VITAMIN D3) 25 MCG (1000 UNIT) tablet Take 1,000 Units by mouth daily.    [provider]  [employer] OMEGA-3 KRILL OIL 500 MG CAPS Take 500 capsules by mouth daily.    [provider]  Cyanocobalamin (VITAMIN B-12) 1000 MCG SUBL Place 1 tablet (1,000 mcg total) under the tongue in the morning. 05/03/21   Arnaldo Natal, NP  estradiol (ESTRACE) 0.1 MG/GM vaginal cream Place vaginally. 07/27/21   [provider]  fludrocortisone (FLORINEF) 0.1 MG tablet TAKE 0.5 TABLETS BY MOUTH TWICE DAILY AT 8AM AND 2PM 07/25/23   Pricilla Riffle, MD  furosemide (LASIX) 20 MG tablet Take 1 tablet (20 mg total) by mouth daily as needed for edema. 06/26/23   [name], [name], MD  LORazepam (ATIVAN) 0.5 MG tablet Take 0.5-1 tablets (0.25-0.5 mg total) by mouth at bedtime as needed for anxiety (insomnia, overactive bladder). 03/27/23   [name], [name], MD  pantoprazole (PROTONIX) 40 MG tablet Take 1 tablet (40 mg total) by mouth daily. 06/13/23   [name], [name], MD  polyethylene glycol (MIRALAX / GLYCOLAX) 17 g packet Take 17 g by mouth daily.    [provider]  potassium chloride (KLOR-CON) 10 MEQ tablet Take 1 tablet (10  mEq total) by mouth daily as needed. 06/26/23   [name], [name], MD  Romosozumab-aqqg (EVENITY) 105 MG/1. SOSY injection Inject 210 mg into the skin every 30 (thirty) days.    [provider]  Wheat Dextrin (BENEFIBER DRINK MIX PO) Take 1 Capful by mouth daily.    [provider]      Allergies    Fosamax [alendronate sodium], Gabapentin, Lidocaine, Midodrine, Risedronate sodium, Shellfish allergy, and Statins    Review of Systems   Review of Systems  Respiratory:  Positive for shortness of breath.     Physical Exam Updated Vital Signs BP (!) 133/104 (BP Location: Right  Arm)   Pulse 91   Temp 98.2 F (36.8 C) (Oral)   Resp (!) 23   Ht 5\' 1"  (1.549 m)   Wt 44.5 kg   SpO2 96%   BMI 18.52 kg/m  Physical Exam Vitals and nursing note reviewed.  Constitutional:      General: She is not in acute distress.    Appearance: She is well-developed.  HENT:     Head: Normocephalic and atraumatic.     Mouth/Throat:     Mouth: Mucous membranes are moist.  Eyes:     Extraocular Movements: Extraocular movements intact.  Neck:     Vascular: JVD present.  Cardiovascular:     Rate and Rhythm: Regular rhythm. Tachycardia present.     Pulses: Normal pulses.     Heart sounds: Normal heart sounds.  Pulmonary:     Effort: Pulmonary effort is normal.     Breath sounds: Examination of the right-lower field reveals decreased breath sounds and rales. Examination of the left-lower field reveals decreased breath sounds and rales. Decreased breath sounds and rales present.     Comments: Dyspneic upon position changes Abdominal:     Palpations: Abdomen is soft.     Tenderness: There is no abdominal tenderness.  Musculoskeletal:        General: Normal range of motion.     Cervical back: Normal range of motion and neck supple.     Right lower leg: Edema (To ankle) present.     Left lower leg: Edema (To ankle) present.  Skin:    General: Skin is warm and dry.  Neurological:     General: No focal deficit present.     Mental Status: She is alert and oriented to person, place, and time.  Psychiatric:        Mood and Affect: Mood normal.        Behavior: Behavior normal.     ED Results / Procedures / Treatments   Labs (all labs ordered are listed, but only abnormal results are displayed) Labs Reviewed  BASIC METABOLIC PANEL - Abnormal; Notable for the following components:      Result Value   CO2 21 (*)    Glucose, Bld 121 (*)    Creatinine, Ser 1.18 (*)    GFR, Estimated 45 (*)    All other components within normal limits  BRAIN NATRIURETIC PEPTIDE - Abnormal;  Notable for the following components:   B Natriuretic Peptide 4,411.4 (*)    All other components within normal limits  CBC WITH DIFFERENTIAL/PLATELET - Abnormal; Notable for the following components:   RBC 3.74 (*)    Hemoglobin 11.6 (*)    Platelets 141 (*)    All other components within normal limits  TROPONIN I (HIGH SENSITIVITY) - Abnormal; Notable for the following components:   Troponin I (High Sensitivity) 18 (*)  All other components within normal limits  MAGNESIUM  TROPONIN I (HIGH SENSITIVITY)    EKG EKG Interpretation Date/Time:  Tuesday July 30 2023 11:14:36 EST Ventricular Rate:  110 PR Interval:  129 QRS Duration:  123 QT Interval:  372 QTC Calculation: 504 R Axis:   106  Text Interpretation: Sinus tachycardia Ventricular premature complex Nonspecific intraventricular conduction delay Anterior infarct, old Borderline repolarization abnormality No significant change since last tracing Confirmed by Elayne Snare ([id number]) on 07/30/2023 11:22:16 AM  Radiology DG Chest Port 1 View Result Date: 07/30/2023 CLINICAL DATA:  88 year old female with shortness of breath. Atrial fibrillation. EXAM: PORTABLE CHEST 1 VIEW COMPARISON:  Chest radiographs 07/07/2023 and earlier. FINDINGS: Portable AP semi upright view at 1134 hours. Unresolved veiling bilateral lung base pleural effusions with increased fluid tracking into the right minor fissure now. Underlying cardiomegaly. Stable visible mediastinal contours. Upper lung pulmonary vascularity appears stable to improved, no overt edema. No pneumothorax. No air bronchograms. No acute osseous abnormality identified. Paucity of visible bowel gas. IMPRESSION: 1. Ongoing small to moderate bilateral pleural effusions with increased fluid tracking into the right minor fissure since last month. 2. Cardiomegaly. No overt edema. No new cardiopulmonary abnormality. Electronically Signed   By: Odessa Fleming M.D.   On: 07/30/2023 11:56     Procedures Procedures    Medications Ordered in ED Medications  furosemide (LASIX) injection 20 mg (20 mg Intravenous Given 07/30/23 1138)    ED Course/ Medical Decision Making/ A&P Clinical Course as of 07/30/23 1321  Tue Jul 30, 2023  1206 Small to moderate bilateral pleural effusions on CXR. Labs pending. [VK]  1307 Significantly elevated BNP worsening from outpatient labs. Patient will be recommended admission for diuresis. [VK]    Clinical Course User Index [VK] Rexford Maus, DO                                 Medical Decision Making This patient presents to the ED with chief complaint(s) of shortness of breath with pertinent past medical history of CHF, asthma, orthostatic hypotension which further complicates the presenting complaint. The complaint involves an extensive differential diagnosis and also carries with it a high risk of complications and morbidity.    The differential diagnosis includes ACS, arrhythmia, anemia, pneumonia, pneumothorax, pulmonary edema, pleural effusion  Additional history obtained: Additional history obtained from EMS  Records reviewed Primary Care Documents and outpatient cardiology records -echo done 3 days ago shows worsening EF at 25% and outpatient labs with elevated BNP greater than 2000  ED Course and Reassessment: On patient's arrival she was mildly tachycardic otherwise hemodynamically stable in no acute distress satting well on room air.  EKG showed sinus tachycardia with acute small PACs, no ischemic changes.  Patient does appear acutely volume overloaded and will be given IV Lasix and will have labs and chest x-ray performed and will be closely reassessed.  Independent labs interpretation:  The following labs were independently interpreted: significant elevated BNP, mildly elevated troponin, otherwise within normal range  Independent visualization of imaging: - I independently visualized the following imaging with scope  of interpretation limited to determining acute life threatening conditions related to emergency care: CXR, which revealed bilateral pleural effusions  Consultation: - Consulted or discussed management/test interpretation w/ external professional: hospitalist  Consideration for admission or further workup: patient requires admission for diuresis Social Determinants of health: N/A    Amount and/or Complexity of Data Reviewed Labs:  ordered. Radiology: ordered.  Risk Prescription drug management. Decision regarding hospitalization.          Final Clinical Impression(s) / ED Diagnoses Final diagnoses:  Acute on chronic congestive heart failure, unspecified heart failure type [facility])    Rx / DC Orders ED Discharge Orders     None         Rexford Maus, DO 07/30/23 1321

## 2023-07-30 NOTE — ED Notes (Signed)
Pt eating dinner

## 2023-07-30 NOTE — Consult Note (Addendum)
Cardiology Consultation   Patient ID: Cindy Meza MRN: [id number]; DOB: Oct 05, 1935  Admit date: 07/30/2023 Date of Consult: 07/30/2023  PCP:  Tresa Garter, MD   [facility] Providers Cardiologist:  Dietrich Pates, MD        Patient Profile:   Cindy Meza is a 88 y.o. female with a hx of fatigue, orthostatic intolerance, mitral regurgitation, asthma, GERD, and aortic atherosclerosis seen on previous CT of abdomen who is being seen 07/30/2023 for the evaluation of SOB at the request of Dr. Alinda Money.  History of Present Illness:   Cindy Meza is a 88 year old female with past medical history of fatigue, orthostatic intolerance, mitral regurgitation, asthma, GERD, and aortic atherosclerosis seen on previous CT of abdomen.  Myoview in 2019 showed no ischemia, EF down by previously okay.  Echocardiogram in 2019 showed EF 50 to 55%, mild diastolic dysfunction, moderate MR.  She has not been able to tolerate rosuvastatin due to constipation.  She is very sensitive to to several medications.  She was seen by Dr. Tenny Craw in November 2024 at which time she was doing well.  She is on Florinef for orthostatic hypotension.  Patient was seen in the ED on 06/18/2023 due to shortness of breath with exertion.  PCP prescribed albuterol and a Z-Pak due to concern for asthma versus COPD flare.  She also complained of worsening bilateral lower extremity edema.  Chest x-ray showed bilateral lower lobe effusion.  BNP 3167.  Serial troponin 13-/20.  She was offered admission however she declined.  When I saw her for posthospital follow-up on 07/11/2023, breathing has improved on Lasix.  I kept her on 20 mg as needed dose of Lasix.  I was hesitant to increase Lasix to daily dosing given prior history of orthostatic hypotension on Florinef.  Echocardiogram was ordered and eventually performed on 07/25/2023.  This showed EF 25%, RVSP 50.8 mmHg, severe left atrial enlargement, severe MR.  In the past  week, patient has been having increasing dyspnea on exertion, orthopnea and PND.  She eventually sought medical attention at [facility], ED on 07/30/2023.  Creatinine was 1.18.  BNP 4411.  Troponin 18.  Hemoglobin 11.6.  Chest x-ray showed small to moderate bilateral pleural effusion, no overt edema.  EKG showed sinus rhythm, left bundle branch block, frequent PACs.  Cardiology service consulted for acute systolic heart failure.    Past Medical History:  Diagnosis Date   Abdominal pain 03/30/2015   Dr Leone Payor     /16, 1/21 ?fosamax related  S/p gyn eval, abd CT (-)  11/16 50% better  Better off Fosamax, off Miralax   12/22 Recurrent abd pain - pt saw Dr Leone Payor; CT w/constipation - using Miralax and Benefiber - better now     Allergy    Anemia    Asthma    Dr. Sherene Sires   Blood transfusion without reported diagnosis    Breast cyst    Left   Cataract    Dr. Nile Riggs   Cerumen impaction 04/06/2016   9/17 L, 10/19 B     Chest pressure 07/22/2012   1/14, 8/17  Recurrent - ?asthma related (11/17)  Advair MDI - use qd, not prn  We added Singulair  Cardiac w/up was (-) - --- Dr Tenny Craw     Closed right hip fracture (HCC) 06/12/2016   Dr Ave Filter  R hip fx - intermedullary nail 06/13/17 s/p   On Prolia, calcium, Vit D  Complication of anesthesia    ' I HAD HALLUCINATIONS WHEN THEY DID MY HIP "   GERD (gastroesophageal reflux disease)    Heart murmur    Hemorrhoids    History of colonic polyps Gessner   hx of adenomas (small) 1998   Hot flash, menopausal 02/15/2012   Ongoing   D/c estrogens due to breast abnormalities 2013     Nausea 02/12/2019   8/20 d/c Midodrin, Florinef  Occasional.   Nausea after taking Pantoprazole - take w/food     OA (osteoarthritis)    Orthostatic hypotension    Osteopenia    PAC (premature atrial contraction)    Upper respiratory infection 06/13/2023   Zpac if worse     Vitamin D deficiency     Past Surgical History:  Procedure Laterality Date   ABDOMINAL  HYSTERECTOMY     complete   APPENDECTOMY     BREAST CYST EXCISION     CATARACT EXTRACTION, BILATERAL     04/23/18 left eye, 04/02/18 right eye   COLONOSCOPY  Multiple   Adenomatous colon polyps   ESOPHAGOGASTRODUODENOSCOPY  2010   INTRAMEDULLARY (IM) NAIL INTERTROCHANTERIC Right 06/13/2016   Procedure: INTRAMEDULLARY (IM) NAIL INTERTROCHANTRIC;  Surgeon: Jones Broom, MD;  Location: [facility] OR;  Service: Orthopedics;  Laterality: Right;   TONSILLECTOMY       Home Medications:  Prior to Admission medications   Medication Sig Start Date End Date Taking? Authorizing Provider  albuterol (PROVENTIL HFA) 108 (90 Base) MCG/ACT inhaler Inhale 1-2 puffs into the lungs every 4 (four) hours as needed for wheezing or shortness of breath. 06/13/23  Yes [name], [name], MD  aspirin 81 MG tablet Take 81 mg by mouth daily.   Yes [provider]  budesonide-formoterol (SYMBICORT) 80-4.5 MCG/ACT inhaler Inhale 2 puffs into the lungs 2 (two) times daily. 06/26/23  Yes [name], [name], MD  Calcium-Magnesium-Vitamin D (CALCIUM 500 PO) Take 1 tablet by mouth 2 (two) times daily with a meal. With lunch and supper   Yes   cholecalciferol (VITAMIN D3) 25 MCG (1000 UNIT) tablet Take 1,000 Units by mouth daily.   Yes [provider]  [employer] OMEGA-3 KRILL OIL 500 MG CAPS Take 500 capsules by mouth daily.   Yes [provider]  Cyanocobalamin (VITAMIN B-12) 1000 MCG SUBL Place 1 tablet (1,000 mcg total) under the tongue in the morning. 05/03/21  Yes Arnaldo Natal, NP  estradiol (ESTRACE) 0.1 MG/GM vaginal cream Place vaginally. 07/27/21  Yes [provider]  fludrocortisone (FLORINEF) 0.1 MG tablet TAKE 0.5 TABLETS BY MOUTH TWICE DAILY AT 8AM AND 2PM Patient taking differently: Take 0.05 mg by mouth 2 (two) times daily. 07/25/23  Yes Pricilla Riffle, MD  furosemide (LASIX) 20 MG tablet Take 1 tablet (20 mg total) by mouth daily as needed for edema. 06/26/23  Yes [name],  [name], MD  LORazepam (ATIVAN) 0.5 MG tablet Take 0.5-1 tablets (0.25-0.5 mg total) by mouth at bedtime as needed for anxiety (insomnia, overactive bladder). 03/27/23  Yes [name], [name], MD  pantoprazole (PROTONIX) 40 MG tablet Take 1 tablet (40 mg total) by mouth daily. 06/13/23  Yes [name], [name], MD  polyethylene glycol (MIRALAX / GLYCOLAX) 17 g packet Take 17 g by mouth daily.   Yes [provider]  potassium chloride (KLOR-CON) 10 MEQ tablet Take 1 tablet (10 mEq total) by mouth daily as needed. Patient taking differently: Take 10 mEq by mouth daily as needed (swelling). 06/26/23  Yes [name],  [name], MD  Romosozumab-aqqg (EVENITY) 105 MG/1. SOSY injection Inject 210 mg into the skin every 30 (thirty) days.   Yes [provider]  Wheat Dextrin (BENEFIBER DRINK MIX PO) Take 1 Capful by mouth daily.   Yes [provider]    Inpatient Medications: Scheduled Meds:  [START ON 07/31/2023] aspirin EC  81 mg Oral Daily   fludrocortisone  0.05 mg Oral BID   [START ON 07/31/2023] furosemide  20 mg Intravenous BID   heparin  5,000 Units Subcutaneous Q8H   mometasone-formoterol  2 puff Inhalation BID   [START ON 07/31/2023] pantoprazole  40 mg Oral Daily   Romosozumab-aqqg  210 mg Subcutaneous Once   sodium chloride flush  3 mL Intravenous Q12H   Continuous Infusions:  PRN Meds: acetaminophen **OR** acetaminophen, albuterol, LORazepam, polyethylene glycol  Allergies:    Allergies  Allergen Reactions   Fosamax [Alendronate Sodium]     Paresthesias, abd pain   Gabapentin     dizziness   Lidocaine Swelling    Of face and lips.  Novocaine does same thing.   Midodrine     Nausea    Risedronate Sodium     Knee pain   Shellfish Allergy Hives   Statins     REACTION: pains    Social History:   Social History   Socioeconomic History   Marital status: Widowed    Spouse name: Not on file   Number of children: Not on file   Years of  education: Not on file   Highest education level: Not on file  Occupational History   Occupation: retired  Tobacco Use   Smoking status: Never   Smokeless tobacco: Never  Vaping Use   Vaping status: Never Used  Substance and Sexual Activity   Alcohol use: Not Currently    Alcohol/week: 0.0 standard drinks of alcohol   Drug use: No   Sexual activity: Not Currently  Other Topics Concern   Not on file  Social History Narrative   She lives alone in a [place] home.  No children.   Retired Child psychotherapist.   Highest level of education:  Masters degree      Right Handed    Lives in one story home / lives alone    Social Drivers of Health   Financial Resource Strain: Low Risk  (08/27/2022)   Overall Financial Resource Strain (CARDIA)    Difficulty of Paying Living Expenses: Not hard at all  Food Insecurity: No Food Insecurity (08/27/2022)   Hunger Vital Sign    Worried About Running Out of Food in the Last Year: Never true    Ran Out of Food in the Last Year: Never true  Transportation Needs: No Transportation Needs (08/27/2022)   PRAPARE - Administrator, Civil Service (Medical): No    Lack of Transportation (Non-Medical): No  Physical Activity: Inactive (08/27/2022)   Exercise Vital Sign    Days of Exercise per Week: 0 days    Minutes of Exercise per Session: 0 min  Stress: No Stress Concern Present (08/27/2022)   [employer] of Occupational Health - Occupational Stress Questionnaire    Feeling of Stress : Not at all  Social Connections: Moderately Isolated (08/27/2022)   Social Connection and Isolation Panel [NHANES]    Frequency of Communication with Friends and Family: More than three times a week    Frequency of Social Gatherings with Friends and Family: Three times a week    Attends Religious Services: More  than 4 times per year    Active Member of Clubs or Organizations: No    Attends Banker Meetings: Never    Marital Status: Widowed   Intimate Partner Violence: Not At Risk (08/27/2022)   Humiliation, Afraid, Rape, and Kick questionnaire    Fear of Current or Ex-Partner: No    Emotionally Abused: No    Physically Abused: No    Sexually Abused: No    Family History:    Family History  Problem Relation Age of Onset   Diabetes Mother 80       Deceased   Hypertension Other    Colon cancer Neg Hx    Pancreatic cancer Neg Hx    Stomach cancer Neg Hx    Esophageal cancer Neg Hx    Rectal cancer Neg Hx      ROS:  Please see the history of present illness.   All other ROS reviewed and negative.     Physical Exam/Data:   Vitals:   07/30/23 1115 07/30/23 1118 07/30/23 1415 07/30/23 1530  BP: (!) 133/104   (!) 147/93  Pulse: 91  100   Resp: (!) 23  20 18   Temp: 98.2 F (36.8 C)     TempSrc: Oral     SpO2: 96%  100%   Weight:  44.5 kg    Height:  5\' 1"  (1.549 m)     No intake or output data in the 24 hours ending 07/30/23 1535    07/30/2023   11:18 AM 07/11/2023   10:36 AM 07/05/2023    3:09 PM  Last 3 Weights  Weight (lbs) 98 lb 98 lb 97 lb 9.6 oz  Weight (kg) 44.453 kg 44.453 kg 44.271 kg     Body mass index is 18.52 kg/m.  General:  Well nourished, well developed, in no acute distress HEENT: normal Neck: no JVD Vascular: No carotid bruits; Distal pulses 2+ bilaterally Cardiac:  normal S1, S2; RRR; no murmur  Lungs: Markedly diminished breath sounds in bilateral bases Abd: soft, nontender, no hepatomegaly  Ext: no edema Musculoskeletal:  No deformities, BUE and BLE strength normal and equal Skin: warm and dry  Neuro:  CNs 2-12 intact, no focal abnormalities noted Psych:  Normal affect   EKG:  The EKG was personally reviewed and demonstrates: Sinus rhythm, left bundle branch block, frequent PACs Telemetry:  Telemetry was personally reviewed and demonstrates: Sinus rhythm, frequent PACs, no significant ventricular ectopy.  Relevant CV Studies:  Myoview 06/26/2018 Nuclear stress EF:  41%. There was no ST segment deviation noted during stress. The left ventricular ejection fraction is moderately decreased (30-44%). This is a low risk study.   1. EF calculated as 41% but not sure gating was accurate.  Would suggest confirmation by echo.  2. No evidence for ischemia or infarction on perfusion images.    Overall, low risk study.      Echo 07/25/2023  1. Left ventricular ejection fraction, by estimation, is 25%. Left  ventricular ejection fraction by 3D volume is 33 %. The left ventricle has  severely decreased function. The left ventricle demonstrates global  hypokinesis with best preserved function at   the lateral base. The left ventricular internal cavity size was mildly  dilated. There is mild left ventricular hypertrophy. Left ventricular  diastolic parameters are indeterminate.   2. Right ventricular systolic function is mildly reduced. The right  ventricular size is normal. There is moderately elevated pulmonary artery  systolic pressure. The estimated right ventricular  systolic pressure is  50.8 mmHg.   3. Left atrial size was severely dilated.   4. Right atrial size was mildly dilated.   5. A small pericardial effusion is present.   6. The mitral valve is grossly normal. Severe mitral valve regurgitation.  No evidence of mitral stenosis.   7. The aortic valve is grossly normal. There is mild thickening of the  aortic valve. Aortic valve regurgitation is trivial. No aortic stenosis is  present.   8. The inferior vena cava is normal in size with <50% respiratory  variability, suggesting right atrial pressure of 8 mmHg.    Laboratory Data:  High Sensitivity Troponin:   Recent Labs  Lab 07/30/23 1200  TROPONINIHS 18*     Chemistry Recent Labs  Lab 07/30/23 1200  NA 138  K 3.6  CL 107  CO2 21*  GLUCOSE 121*  BUN 16  CREATININE 1.18*  CALCIUM 9.1  MG 2.3  GFRNONAA 45*  ANIONGAP 10    No results for input(s): "PROT", "ALBUMIN", "AST",  "ALT", "ALKPHOS", "BILITOT" in the last 168 hours. Lipids No results for input(s): "CHOL", "TRIG", "HDL", "LABVLDL", "LDLCALC", "CHOLHDL" in the last 168 hours.  Hematology Recent Labs  Lab 07/30/23 1200  WBC 7.4  RBC 3.74*  HGB 11.6*  HCT 36.3  MCV 97.1  MCH 31.0  MCHC 32.0  RDW 13.4  PLT 141*   Thyroid No results for input(s): "TSH", "FREET4" in the last 168 hours.  BNP Recent Labs  Lab 07/30/23 1200  BNP 4,411.4*    DDimer No results for input(s): "DDIMER" in the last 168 hours.   Radiology/Studies:  DG Chest Port 1 View Result Date: 07/30/2023 CLINICAL DATA:  88 year old female with shortness of breath. Atrial fibrillation. EXAM: PORTABLE CHEST 1 VIEW COMPARISON:  Chest radiographs 07/07/2023 and earlier. FINDINGS: Portable AP semi upright view at 1134 hours. Unresolved veiling bilateral lung base pleural effusions with increased fluid tracking into the right minor fissure now. Underlying cardiomegaly. Stable visible mediastinal contours. Upper lung pulmonary vascularity appears stable to improved, no overt edema. No pneumothorax. No air bronchograms. No acute osseous abnormality identified. Paucity of visible bowel gas. IMPRESSION: 1. Ongoing small to moderate bilateral pleural effusions with increased fluid tracking into the right minor fissure since last month. 2. Cardiomegaly. No overt edema. No new cardiopulmonary abnormality. Electronically Signed   By: Odessa Fleming M.D.   On: 07/30/2023 11:56     Assessment and Plan:   Acute systolic heart failure   -Newly diagnosed heart failure.  Previous echocardiogram obtained on 05/20/2018 showed EF 50 to 55%.  Repeat echocardiogram obtained on 07/25/2023 showed EF 25% with severe MR.  -Patient denies any recent chest pain.  She does not have any lower extremity edema on exam.  She received 20 mg IV Lasix this morning and currently on 20 mg twice daily dosing.  Agree with IV diuresis as patient clearly has mild to moderate bilateral  pleural effusion.  -Differential diagnoses including underlying CAD versus severe MR contributing to drop in the ejection fraction.  -Will discuss with MD, given new onset of systolic heart failure and severe MR seen on recent echocardiogram, will need a left and right heart cath.  Severe mitral regurgitation: Likely contributed to the drop in the ejection fraction.  Will need left and right heart cath +/- TEE  Orthostatic intolerance: On Florinef at home.  Will need to discontinue Florinef given newfound heart failure.   Risk Assessment/Risk Scores:  New York Heart Association (NYHA) Functional Class NYHA Class III        For questions or updates, please contact [facility] Please consult [email or web address] for contact info under    Signed, Azalee Course, [place]  07/30/2023 3:35 PM   Agree with note by Azalee Course PA-C  Asked to see the frail 88 year old single African-American female with no children for heart failure.  She is a patient of Dr. Dietrich Pates is.  She had normal LV function by previous 2D echo in 2019 with normal Myoview.  She showed up to the ER early December with heart failure and went home.  She has had progressive symptoms with sudden onset of shortness of breath that prompted her admission.  Her BNP was elevated at over 4000.  Her troponins were negative.  Chest x-ray showed bilateral pleural effusions.  She had peripheral edema.  She was diuresed and feels clinically improved.  2D echo performed by her PCP as an outpatient on 07/25/2023 showed marked LV dysfunction with severe MR.  She does have a living will but wishes to proceed with an invasive evaluation.  Will arrange right and left heart cath tomorrow.  Hopefully this is not ischemically mediated given her MR.  She may be a candidate for MitraClip.  Runell Gess, M.D., FACP, [facility], [facility] [address] [place], [place]   [address]  [phone] 07/30/2023 5:13 PM

## 2023-07-31 ENCOUNTER — Other Ambulatory Visit (HOSPITAL_COMMUNITY): Payer: Self-pay

## 2023-07-31 ENCOUNTER — Ambulatory Visit: Payer: Medicare Other | Admitting: Physician Assistant

## 2023-07-31 ENCOUNTER — Telehealth (HOSPITAL_COMMUNITY): Payer: Self-pay | Admitting: Pharmacy Technician

## 2023-07-31 ENCOUNTER — Inpatient Hospital Stay (HOSPITAL_COMMUNITY)
Admission: EM | Disposition: A | Discharge status: Home / Self Care | Payer: Self-pay | Source: Home / Self Care | Attending: Internal Medicine

## 2023-07-31 ENCOUNTER — Other Ambulatory Visit: Payer: Self-pay

## 2023-07-31 DIAGNOSIS — J4520 Mild intermittent asthma, uncomplicated: Secondary | ICD-10-CM | POA: Diagnosis not present

## 2023-07-31 DIAGNOSIS — I2510 Atherosclerotic heart disease of native coronary artery without angina pectoris: Secondary | ICD-10-CM

## 2023-07-31 DIAGNOSIS — K219 Gastro-esophageal reflux disease without esophagitis: Secondary | ICD-10-CM | POA: Diagnosis not present

## 2023-07-31 DIAGNOSIS — I5023 Acute on chronic systolic (congestive) heart failure: Secondary | ICD-10-CM

## 2023-07-31 DIAGNOSIS — N1831 Chronic kidney disease, stage 3a: Secondary | ICD-10-CM | POA: Diagnosis not present

## 2023-07-31 DIAGNOSIS — I5021 Acute systolic (congestive) heart failure: Secondary | ICD-10-CM | POA: Diagnosis not present

## 2023-07-31 HISTORY — PX: RIGHT/LEFT HEART CATH AND CORONARY ANGIOGRAPHY: CATH118266

## 2023-07-31 LAB — COMPREHENSIVE METABOLIC PANEL
ALT: 52 U/L — ABNORMAL HIGH (ref 0–44)
AST: 53 U/L — ABNORMAL HIGH (ref 15–41)
Albumin: 3.8 g/dL (ref 3.5–5.0)
Alkaline Phosphatase: 40 U/L (ref 38–126)
Anion gap: 14 (ref 5–15)
BUN: 20 mg/dL (ref 8–23)
CO2: 22 mmol/L (ref 22–32)
Calcium: 8.9 mg/dL (ref 8.9–10.3)
Chloride: 102 mmol/L (ref 98–111)
Creatinine, Ser: 1.26 mg/dL — ABNORMAL HIGH (ref 0.44–1.00)
GFR, Estimated: 42 mL/min — ABNORMAL LOW (ref >60)
Glucose, Bld: 113 mg/dL — ABNORMAL HIGH (ref 70–99)
Potassium: 3.4 mmol/L — ABNORMAL LOW (ref 3.5–5.1)
Sodium: 138 mmol/L (ref 135–145)
Total Bilirubin: 0.9 mg/dL (ref 0.0–1.2)
Total Protein: 6.8 g/dL (ref 6.5–8.1)

## 2023-07-31 LAB — POCT I-STAT EG7
Acid-Base Excess: 3 mmol/L — ABNORMAL HIGH (ref 0.0–2.0)
Bicarbonate: 27.6 mmol/L (ref 20.0–28.0)
Calcium, Ion: 1.15 mmol/L (ref 1.15–1.40)
HCT: 35 % — ABNORMAL LOW (ref 36.0–46.0)
Hemoglobin: 11.9 g/dL — ABNORMAL LOW (ref 12.0–15.0)
O2 Saturation: 60 %
Potassium: 3.2 mmol/L — ABNORMAL LOW (ref 3.5–5.1)
Sodium: 140 mmol/L (ref 135–145)
TCO2: 29 mmol/L (ref 22–32)
pCO2, Ven: 40.2 mm[Hg] — ABNORMAL LOW (ref 44–60)
pH, Ven: 7.444 — ABNORMAL HIGH (ref 7.25–7.43)
pO2, Ven: 30 mm[Hg] — CL (ref 32–45)

## 2023-07-31 LAB — CBC
HCT: 38.6 % (ref 36.0–46.0)
Hemoglobin: 12.3 g/dL (ref 12.0–15.0)
MCH: 31.1 pg (ref 26.0–34.0)
MCHC: 31.9 g/dL (ref 30.0–36.0)
MCV: 97.5 fL (ref 80.0–100.0)
Platelets: 182 10*3/uL (ref 150–400)
RBC: 3.96 MIL/uL (ref 3.87–5.11)
RDW: 13.3 % (ref 11.5–15.5)
WBC: 7.5 10*3/uL (ref 4.0–10.5)
nRBC: 0 % (ref 0.0–0.2)

## 2023-07-31 LAB — POCT I-STAT 7, (LYTES, BLD GAS, ICA,H+H)
Acid-Base Excess: 3 mmol/L — ABNORMAL HIGH (ref 0.0–2.0)
Bicarbonate: 26.3 mmol/L (ref 20.0–28.0)
Calcium, Ion: 1.13 mmol/L — ABNORMAL LOW (ref 1.15–1.40)
HCT: 35 % — ABNORMAL LOW (ref 36.0–46.0)
Hemoglobin: 11.9 g/dL — ABNORMAL LOW (ref 12.0–15.0)
O2 Saturation: 95 %
Potassium: 3.2 mmol/L — ABNORMAL LOW (ref 3.5–5.1)
Sodium: 140 mmol/L (ref 135–145)
TCO2: 27 mmol/L (ref 22–32)
pCO2 arterial: 34.7 mm[Hg] (ref 32–48)
pH, Arterial: 7.488 — ABNORMAL HIGH (ref 7.35–7.45)
pO2, Arterial: 71 mm[Hg] — ABNORMAL LOW (ref 83–108)

## 2023-07-31 SURGERY — RIGHT/LEFT HEART CATH AND CORONARY ANGIOGRAPHY
Anesthesia: LOCAL

## 2023-07-31 MED ORDER — HEPARIN SODIUM (PORCINE) 1000 UNIT/ML IJ SOLN
INTRAMUSCULAR | Status: AC
  Filled 2023-07-31: qty 10

## 2023-07-31 MED ORDER — MIDAZOLAM HCL 2 MG/2ML IJ SOLN
INTRAMUSCULAR | Status: AC
  Filled 2023-07-31: qty 2

## 2023-07-31 MED ORDER — VERAPAMIL HCL 2.5 MG/ML IV SOLN
INTRAVENOUS | Status: AC
  Filled 2023-07-31: qty 2

## 2023-07-31 MED ORDER — SODIUM CHLORIDE 0.9 % IV SOLN: INTRAVENOUS | Status: DC

## 2023-07-31 MED ORDER — IOHEXOL 350 MG/ML SOLN
INTRAVENOUS | Status: DC | PRN
  Administered 2023-07-31: 44 mL

## 2023-07-31 MED ORDER — ASPIRIN 81 MG PO CHEW
81.0000 mg | CHEWABLE_TABLET | ORAL | Status: AC
  Administered 2023-07-31: 81 mg via ORAL
  Filled 2023-07-31: qty 1

## 2023-07-31 MED ORDER — HEPARIN SODIUM (PORCINE) 1000 UNIT/ML IJ SOLN
INTRAMUSCULAR | Status: DC | PRN
  Administered 2023-07-31: 2000 [IU] via INTRAVENOUS

## 2023-07-31 MED ORDER — ONDANSETRON HCL 4 MG/2ML IJ SOLN
4.0000 mg | Freq: Four times a day (QID) | INTRAMUSCULAR | Status: DC | PRN
Start: 2023-07-31 — End: 2023-08-03
  Administered 2023-08-02: 4 mg via INTRAVENOUS
  Filled 2023-07-31: qty 2

## 2023-07-31 MED ORDER — ASPIRIN 81 MG PO CHEW: 81.0000 mg | CHEWABLE_TABLET | ORAL | Status: DC

## 2023-07-31 MED ORDER — POTASSIUM CHLORIDE CRYS ER 20 MEQ PO TBCR
40.0000 meq | EXTENDED_RELEASE_TABLET | Freq: Once | ORAL | Status: AC
  Administered 2023-07-31: 40 meq via ORAL
  Filled 2023-07-31: qty 2

## 2023-07-31 MED ORDER — ORAL CARE MOUTH RINSE: 15.0000 mL | OROMUCOSAL | Status: DC | PRN

## 2023-07-31 MED ORDER — FUROSEMIDE 10 MG/ML IJ SOLN
60.0000 mg | Freq: Once | INTRAMUSCULAR | Status: AC
  Administered 2023-07-31: 60 mg via INTRAVENOUS
  Filled 2023-07-31: qty 6

## 2023-07-31 MED ORDER — SODIUM CHLORIDE 0.9 % IV SOLN: 250.0000 mL | INTRAVENOUS | Status: AC | PRN

## 2023-07-31 MED ORDER — HEPARIN (PORCINE) IN NACL 1000-0.9 UT/500ML-% IV SOLN
INTRAVENOUS | Status: DC | PRN
  Administered 2023-07-31: 500 mL

## 2023-07-31 MED ORDER — FENTANYL CITRATE (PF) 100 MCG/2ML IJ SOLN
INTRAMUSCULAR | Status: AC
  Filled 2023-07-31: qty 2

## 2023-07-31 MED ORDER — LIDOCAINE HCL (PF) 1 % IJ SOLN
INTRAMUSCULAR | Status: DC | PRN
  Administered 2023-07-31 (×2): 2 mL via INTRADERMAL

## 2023-07-31 MED ORDER — SODIUM CHLORIDE 0.9% FLUSH: 3.0000 mL | INTRAVENOUS | Status: DC | PRN

## 2023-07-31 MED ORDER — SODIUM CHLORIDE 0.9% FLUSH
3.0000 mL | Freq: Two times a day (BID) | INTRAVENOUS | Status: DC
  Administered 2023-07-31 – 2023-08-02 (×4): 3 mL via INTRAVENOUS

## 2023-07-31 SURGICAL SUPPLY — 11 items
CATH BALLN WEDGE 5F 110CM (CATHETERS) IMPLANT
CATH INFINITI AMBI 5FR JK (CATHETERS) IMPLANT
DEVICE RAD TR BAND REGULAR (VASCULAR PRODUCTS) IMPLANT
GLIDESHEATH SLEND SS 6F .021 (SHEATH) IMPLANT
GUIDEWIRE INQWIRE 1.5J.035X260 (WIRE) IMPLANT
INQWIRE 1.5J .035X260CM (WIRE) ×1
PACK CARDIAC CATHETERIZATION (CUSTOM PROCEDURE TRAY) ×2 IMPLANT
PROTECTION STATION PRESSURIZED (MISCELLANEOUS) ×1
SET ATX-X65L (MISCELLANEOUS) IMPLANT
SHEATH GLIDE SLENDER 4/5FR (SHEATH) IMPLANT
STATION PROTECTION PRESSURIZED (MISCELLANEOUS) IMPLANT

## 2023-07-31 NOTE — Assessment & Plan Note (Signed)
Continue with as needed lorazepam 

## 2023-07-31 NOTE — Progress Notes (Signed)
Heart Failure Stewardship Pharmacist Progress Note   PCP: [name], [name], MD PCP-Cardiologist: Dietrich Pates, MD    HPI:  88 yo F with PMH GERD, asthma, anemia, OAB, CKD IIIa, MR, orthostatic hypotension on fludrocortisone, anxiety, insomnia. Echocardiogram in 2019 showed EF 50 to 55%, mild diastolic dysfunction, moderate MR. Aortic atherosclerosis noted on imaging in 2016. Previously seen in the ED in 06/2023 for DOE and was prescribed ABX, but also noted to have worsening bilateral LEE and BNP was elevated. She declined admission and was prescribed lasix at discharge. She has a history of sensitivity to medications and orthostatic hypotension so lasix was only prn. ECHO from 1/16 with EF reduced to 25%, global hypokinesis, LV mildly dilated, mild LVH, RV function mildly reduced, moderately elevated PASP, left atria severely dilated, right atria mildly dilated, small pericardial effusion, severe MR, trivial AR.   She presented to the ED on 1/21 with worsening SOB. BNP 4411, trop 23. Newly diagnosed HF with evidence of fluid overload. Concern for ICM versus severe MR contributing to reduced EF. Scheduled for cardiac cath today. Morning of 1/22 patient with minimal output since admission and place on BiPAP. Received an additional dose of furosemide 60 mg IV once.  Patient reports feeling okay today. Stated she only took furosemide once this past week since last ED visit in 06/2023. At home she says she has off and on lightheadedness/dizziness. Generally says she feels unsteady on her feet and sometimes feels like she is going to fall. Endorses one time in the past year that she did have a fall. During discussion, they came to take her for cardiac cath. Cath showed severely elevated filling pressures (RA 13, PW 35, PA mean 45), moderate to severe pulmonary hypertension, and normal CI. No evidence of obstructive disease, felt that reduced EF is due to severe MR. Returned after cath and she was  sleeping in bed.  Current HF Medications: Diuretic: furosemide 20 mg IV BID  Prior to admission HF Medications: Diuretic: furosemide 20 mg daily prn edema Other: potassium 10 mEq daily when she taking furosemide  Pertinent Lab Values: Serum creatinine 1.26, BUN 20, Potassium 3.4, Sodium 138, BNP 4411.4,  1/21: Magnesium 2.3  Vital Signs: Weight: 91 lbs (admission weight: 98 lbs) Blood pressure: 110-120s/70-80s  Heart rate: 80-90s I/O: net -0.2 L yesterday; net -2.1 L since admission  Medication Assistance / Insurance Benefits Check: Does the patient have prescription insurance?  Yes Type of insurance plan: Medicare  Outpatient Pharmacy:  Prior to admission outpatient pharmacy: [facility] Is the patient willing to use [facility] pharmacy at discharge? Pending - pt taken for cath during discussion, will ask tomorrow Is the patient willing to transition their outpatient pharmacy to utilize a [facility] outpatient pharmacy?   Pending    Assessment: 1. Acute systolic CHF (LVEF 25%), due to severe MR. NYHA class II symptoms. - Continue furosemide 20 mg IV BID. Cath today showing severe volume overload. Patient reported good urine output since last night and is down 7 lbs. - Consider cautious addition of SGLT-2 inhibitor to help with fluid control prior to discharge given this has less effect on BP than other GDMT. She does have a hx of orthostatic hypotension and high fall risk. Will continue to assess BP and s/sx of hypotension to determine appropriateness. - Hold off on initiation of BB and ACE/ARB/ARNI due to orthostatic hypotension - Daily weights, strict I/Os, keep Mg>2 and K>4 - replaced with K 40 mEq x2 today  Plan: 1) Medication changes recommended at this time: - No changes at this time  2) Patient assistance: - Jardiance/Farxiga $35   3)  Education  - To be completed prior to discharge   Jarrett Ables, PharmD PGY-1 Pharmacy Resident   Sharen Hones, PharmD,  BCPS Heart Failure Stewardship Pharmacist Phone [phone]

## 2023-07-31 NOTE — Progress Notes (Addendum)
  Progress Note   Patient: Cindy Meza LKG:[id number] DOB: March 13, 1936 DOA: 07/30/2023     1 DOS: the patient was seen and examined on 07/31/2023   Brief hospital course: Cindy Meza was admitted to the hospital with the working diagnosis of heart failure exacerbation.   88 y.o. female with medical history significant of GERD, asthma, anemia, OAB, CKD 3A, orthostatic hypotension, low back pain, anxiety, insomnia presenting with worsening shortness of breath.   Patient has been recently undergoing workup for possible CHF.  She presented to the ED on 12/10 with shortness of breath with an elevated BNP and pleural effusions.  Admission was offered but patient declined and has since followed up with PCP and cardiology and had outpatient echocardiogram performed last couple days but has not seen cardiology since it was performed.   Initially was on Lasix as needed for edema and had been taking it more frequently recently but after initial improvement on this she has had worsening shortness of breath again.  Also with pedal edema.   For EMS, initially desaturated on ambulation and was noted to have heart rates in the 90s to 170s that were irregular, no significant tachycardia in the ED.    Assessment and Plan: * Acute on chronic systolic CHF (congestive heart failure) (HCC) Echocardiogram with reduced LV systolic function with EF 25%, LV with global hypokinesis, LV cavity mild dilatation, mild LVH, RV systolic function with mild reduction, RVSP 50,8 mmHg. LA with severe dilatation, RA with mild dilatation, small pericardial effusion, severe mitral valve regurgitation.   01/22 cardiac catheterization  RA 13 RV 53/18  PCWP mean 32 PA 58/35 mean 45  Cardiac output 3,19 and index 2.27 (Fick).   Urine output 1,760 ml Systolic blood pressure 130's   Plan to continue diuresis today had 80 mg IV  Continue to target negative fluid balance.   Acute hypoxemic respiratory failure   Stage 3a  chronic kidney disease (CKD) (HCC) Hypokalemia.   Renal function with serum cr at 1,26 with K at 3,4 and serum bicarbonate at 22.  Na 138   Plan to continue diuresis with IV furosemide. Add Kcl 40 meq x2  Follow up renal function and electrolytes in am.   Asthma No acute exacerbation.   GERD (gastroesophageal reflux disease) Continue PPI   Anxiety Continue with as needed lorazepam.         Subjective: Patient with no chest pain, dyspnea and edema have improved, but not yet back to baseline   Physical Exam: Vitals:   07/31/23 1239 07/31/23 1244 07/31/23 1249 07/31/23 1254  BP: 123/89 (!) 130/91 125/86 125/86  Pulse: 91 (!) 101 (!) 0 (!) 0  Resp: (!) 24 14    Temp:      TempSrc:      SpO2: 96% 95%    Weight:      Height:       Neurology awake and alert ENT with mild pallor with no icterus Cardiovascular with S1 and S2 present and regular, positive systolic murmur at the apex.  Respiratory with mild rales at bases with no wheezing Abdomen with no distention  No lower extremity edema  Data Reviewed:    Family Communication: no family at the bedside   Disposition: Status is: Inpatient Remains inpatient appropriate because: heart failure   Planned Discharge Destination: Home      Author: Coralie Keens, MD 07/31/2023 5:16 PM  For on call review [email or web address].

## 2023-07-31 NOTE — Plan of Care (Signed)
Patient short of breath. Minimal urine output since admission. Evidence of heart failure by labs and exam. Put in additional lasix. Patient placed on BiPAP.

## 2023-07-31 NOTE — Assessment & Plan Note (Addendum)
Echocardiogram with reduced LV systolic function with EF 25%, LV with global hypokinesis, LV cavity mild dilatation, mild LVH, RV systolic function with mild reduction, RVSP 50,8 mmHg. LA with severe dilatation, RA with mild dilatation, small pericardial effusion, severe mitral valve regurgitation.   01/22 cardiac catheterization  RA 13 RV 53/18  PCWP mean 32 PA 58/35 mean 45  Cardiac output 3,19 and index 2.27 (Fick).   Urine output 600 ml Systolic blood pressure 110's   Continue with SGLT 2 inh and spironolactone  Hold on furosemide for now.   Acute hypoxemic respiratory failure  Improved oxygenation, today 02 saturation is 97% on room air.

## 2023-07-31 NOTE — Progress Notes (Signed)
Mobility Specialist Progress Note:   07/31/23 1420  Mobility  Activity Ambulated with assistance in hallway  Level of Assistance Standby assist, set-up cues, supervision of patient - no hands on  Assistive Device None  Distance Ambulated (ft) 200 ft  Activity Response Tolerated well  Mobility Referral Yes  Mobility visit 1 Mobility  Mobility Specialist Start Time (ACUTE ONLY) 1420  Mobility Specialist Stop Time (ACUTE ONLY) 1435  Mobility Specialist Time Calculation (min) (ACUTE ONLY) 15 min   Pt agreeable to mobility session. Required no physical assistance throughout ambulation. No c/o throughout. Pt back in bed with all needs met.   Cindy Meza Mobility Specialist Please contact via SecureChat or  Rehab office at [phone]

## 2023-07-31 NOTE — Assessment & Plan Note (Signed)
Stable cell count.  ?

## 2023-07-31 NOTE — Plan of Care (Signed)

## 2023-07-31 NOTE — Progress Notes (Signed)
Patient resting well.  No respiratory distress noted.  BiPAP available if needed.  RT will continue to monitor.

## 2023-07-31 NOTE — Assessment & Plan Note (Signed)
 No acute exacerbation

## 2023-07-31 NOTE — Assessment & Plan Note (Signed)
Continue PPI ?

## 2023-07-31 NOTE — Hospital Course (Addendum)
Mrs. Groeneveld was admitted to the hospital with the working diagnosis of heart failure exacerbation.   88 y.o. female with medical history significant of GERD, asthma, anemia, OAB, CKD 3A, orthostatic hypotension, low back pain, anxiety, insomnia presenting with worsening shortness of breath.   Patient has been recently undergoing workup for possible CHF.  She presented to the ED on 12/10 with shortness of breath with an elevated BNP and pleural effusions.  Admission was offered but patient declined and has since followed up with PCP and cardiology and had outpatient echocardiogram performed last couple days but has not seen cardiology since it was performed.   Initially was on Lasix as needed for edema and had been taking it more frequently recently but after initial improvement on this she has had worsening shortness of breath again.  Also with pedal edema.  Because persistent symptoms EMS was called, initially desaturated on ambulation and was noted to have heart rates in the 90s to 170s that were irregular, no significant tachycardia in the ED.  On her initial physical examination her blood pressure was 133/104, HR 91, RR 23 and 02 saturation 96%, lungs with decreased breath sounds and rales with no wheezing, heart with S1 and S2 present and irregular, with no gallops, positive systolic murmur at the apex, abdomen with no distention and bilateral ankle edema.    Na 138, K 3,6 Cl 107 bicarbonate 21 glucose 121, bun 16 cr 1,18  Mg 2.3  BNP 4,411  High sensitive troponin 18 and 23  Wbc 7,4 hgb 11,6 plt 141   Chest radiograph with cardiomegaly, bilateral hilar vascular congestion, with bilateral pleural effusions, fluid in the right fissure.   EKG 110 bpm, right axis deviation, left bundle branch block, sinus rhythm with PAC, positive LVH, no significant ST segment or T wave changes.   Patient was placed on IV furosemide for diuresis.  Echocardiogram with reduced LV systolic function and mitral  regurgitation.   01/22 cardiac catheterization with elevated filling pressures and elevated PA pressure.  01/23 patient with improvement in volume status, she has been declining medications and refused to talk to the heart team this morning.  Improved compliance through the course of the day.  Having ectopy on telemetry  01/124 improved volume status, possible discharge tomorrow.  01/25 improved volume status, plan to discharge home today and follow up as outpatient.

## 2023-07-31 NOTE — Assessment & Plan Note (Addendum)
Hypokalemia. (Basal cr 1,2)   A the time of her discharge her serum cr is 1,34 with K at 4,4 and serum bicarbonate at 24  Na 137 P 4.9  Mag 2,2   On SGLT 2 inh and spironolactone. PRN loop diuretic for now.  Follow up renal function and electrolytes in 7 days as outpatient.

## 2023-07-31 NOTE — Telephone Encounter (Signed)
Patient Product/process development scientist completed.    The patient is insured through [employer] Patient has Medicare and is not eligible for a copay card, but may be able to apply for patient assistance or Medicare RX Payment Plan (Patient Must reach out to their plan, if eligible for payment plan), if available.    Ran test claim for Entresto 24-26 mg and the current 30 day co-pay is $35.00.  Ran test claim for Farxiga 10 mg and the current 30 day co-pay is $35.00.  Ran test claim for Jardiance 10 mg and the current 30 day co-pay is $35.00.  This test claim was processed through [facility]- copay amounts may vary at other pharmacies due to pharmacy/plan contracts, or as the patient moves through the different stages of their insurance plan.     Roland Earl, CPHT Pharmacy Technician III Certified Patient Advocate [facility] Pharmacy Patient Advocate Team Direct Number: [phone]  Fax: [phone]

## 2023-07-31 NOTE — H&P (View-Only) (Signed)
Rounding Note    Patient Name: Cindy Meza Date of Encounter: 07/31/2023  [facility] Cardiologist: Dietrich Pates, MD   Subjective   Clinically improved this morning.  No shortness of breath or chest pain.  Inpatient Medications    Scheduled Meds:  aspirin EC  81 mg Oral Daily   furosemide  20 mg Intravenous BID   heparin  5,000 Units Subcutaneous Q8H   mometasone-formoterol  2 puff Inhalation BID   pantoprazole  40 mg Oral Daily   Romosozumab-aqqg  210 mg Subcutaneous Once   sodium chloride flush  3 mL Intravenous Q12H   Continuous Infusions:  PRN Meds: acetaminophen **OR** acetaminophen, albuterol, LORazepam, polyethylene glycol   Vital Signs    Vitals:   07/31/23 0615 07/31/23 0647 07/31/23 0720 07/31/23 0757  BP: (!) 117/92 130/85 133/83   Pulse: 92  96   Resp: 18 16 (!) 22   Temp:    98 F (36.7 C)  TempSrc:    Oral  SpO2: 99% 97% 99%   Weight:      Height:        Intake/Output Summary (Last 24 hours) at 07/31/2023 0923 Last data filed at 07/31/2023 0721 Gross per 24 hour  Intake --  Output 2110 ml  Net -2110 ml      07/30/2023   11:18 AM 07/11/2023   10:36 AM 07/05/2023    3:09 PM  Last 3 Weights  Weight (lbs) 98 lb 98 lb 97 lb 9.6 oz  Weight (kg) 44.453 kg 44.453 kg 44.271 kg      Telemetry    Sinus rhythm- Personally Reviewed  ECG    Sinus tachycardia with sinus arrhythmias.  PACs.  IVCD.- Personally Reviewed  Physical Exam   GEN: No acute distress.   Neck: No JVD Cardiac: RRR, no murmurs, rubs, or gallops.  Respiratory: Clear to auscultation bilaterally. GI: Soft, nontender, non-distended  MS: No edema; No deformity. Neuro:  Nonfocal  Psych: Normal affect   Labs    High Sensitivity Troponin:   Recent Labs  Lab 07/30/23 1200 07/30/23 1726  TROPONINIHS 18* 23*     Chemistry Recent Labs  Lab 07/30/23 1200 07/31/23 0652  NA 138 138  K 3.6 3.4*  CL 107 102  CO2 21* 22  GLUCOSE 121* 113*  BUN 16 20   CREATININE 1.18* 1.26*  CALCIUM 9.1 8.9  MG 2.3  --   PROT  --  6.8  ALBUMIN  --  3.8  AST  --  53*  ALT  --  52*  ALKPHOS  --  40  BILITOT  --  0.9  GFRNONAA 45* 42*  ANIONGAP 10 14    Lipids No results for input(s): "CHOL", "TRIG", "HDL", "LABVLDL", "LDLCALC", "CHOLHDL" in the last 168 hours.  Hematology Recent Labs  Lab 07/30/23 1200 07/31/23 0652  WBC 7.4 7.5  RBC 3.74* 3.96  HGB 11.6* 12.3  HCT 36.3 38.6  MCV 97.1 97.5  MCH 31.0 31.1  MCHC 32.0 31.9  RDW 13.4 13.3  PLT 141* 182   Thyroid No results for input(s): "TSH", "FREET4" in the last 168 hours.  BNP Recent Labs  Lab 07/30/23 1200  BNP 4,411.4*    DDimer No results for input(s): "DDIMER" in the last 168 hours.   Radiology    DG Chest Port 1 View Result Date: 07/30/2023 CLINICAL DATA:  88 year old female with shortness of breath. Atrial fibrillation. EXAM: PORTABLE CHEST 1 VIEW COMPARISON:  Chest radiographs 07/07/2023 and  earlier. FINDINGS: Portable AP semi upright view at 1134 hours. Unresolved veiling bilateral lung base pleural effusions with increased fluid tracking into the right minor fissure now. Underlying cardiomegaly. Stable visible mediastinal contours. Upper lung pulmonary vascularity appears stable to improved, no overt edema. No pneumothorax. No air bronchograms. No acute osseous abnormality identified. Paucity of visible bowel gas. IMPRESSION: 1. Ongoing small to moderate bilateral pleural effusions with increased fluid tracking into the right minor fissure since last month. 2. Cardiomegaly. No overt edema. No new cardiopulmonary abnormality. Electronically Signed   By: Odessa Fleming M.D.   On: 07/30/2023 11:56    Cardiac Studies   None  Patient Profile     Cindy Meza is a 88 y.o. female with a hx of fatigue, orthostatic intolerance, mitral regurgitation, asthma, GERD, and aortic atherosclerosis seen on previous CT of abdomen who is being seen 07/30/2023 for the evaluation of SOB at the  request of Dr. Alinda Money.   Assessment & Plan    1: Acute systolic heart failure-EF reduced since 08/10/2017 from 50 to 55% down to 25% on 07/25/2023 with severe MR.  Her BNP was elevated over 4000.  She was diuresed and feels clinically improved.  Plan right and left heart cath today.     For questions or updates, please contact [facility] Please consult [email or web address] for contact info under        Signed, Nanetta Batty, MD  07/31/2023, 9:23 AM

## 2023-07-31 NOTE — Interval H&P Note (Signed)
History and Physical Interval Note:  07/31/2023 12:09 PM  Cindy Meza  has presented today for surgery, with the diagnosis of mr - heart failure.  The various methods of treatment have been discussed with the patient and family. After consideration of risks, benefits and other options for treatment, the patient has consented to  Procedure(s): RIGHT/LEFT HEART CATH AND CORONARY ANGIOGRAPHY (N/A) as a surgical intervention.  The patient's history has been reviewed, patient examined, no change in status, stable for surgery.  I have reviewed the patient's chart and labs.  Questions were answered to the patient's satisfaction.     Lorine Bears

## 2023-07-31 NOTE — ED Notes (Signed)
Patient states she is having trouble breathing. PRN breathing treatment administered. EKG obtained. MD notified. Cardiologist paged.

## 2023-07-31 NOTE — Progress Notes (Signed)
Rounding Note    Patient Name: Cindy Meza Date of Encounter: 07/31/2023  [facility] Cardiologist: Dietrich Pates, MD   Subjective   Clinically improved this morning.  No shortness of breath or chest pain.  Inpatient Medications    Scheduled Meds:  aspirin EC  81 mg Oral Daily   furosemide  20 mg Intravenous BID   heparin  5,000 Units Subcutaneous Q8H   mometasone-formoterol  2 puff Inhalation BID   pantoprazole  40 mg Oral Daily   Romosozumab-aqqg  210 mg Subcutaneous Once   sodium chloride flush  3 mL Intravenous Q12H   Continuous Infusions:  PRN Meds: acetaminophen **OR** acetaminophen, albuterol, LORazepam, polyethylene glycol   Vital Signs    Vitals:   07/31/23 0615 07/31/23 0647 07/31/23 0720 07/31/23 0757  BP: (!) 117/92 130/85 133/83   Pulse: 92  96   Resp: 18 16 (!) 22   Temp:    98 F (36.7 C)  TempSrc:    Oral  SpO2: 99% 97% 99%   Weight:      Height:        Intake/Output Summary (Last 24 hours) at 07/31/2023 0923 Last data filed at 07/31/2023 0721 Gross per 24 hour  Intake --  Output 2110 ml  Net -2110 ml      07/30/2023   11:18 AM 07/11/2023   10:36 AM 07/05/2023    3:09 PM  Last 3 Weights  Weight (lbs) 98 lb 98 lb 97 lb 9.6 oz  Weight (kg) 44.453 kg 44.453 kg 44.271 kg      Telemetry    Sinus rhythm- Personally Reviewed  ECG    Sinus tachycardia with sinus arrhythmias.  PACs.  IVCD.- Personally Reviewed  Physical Exam   GEN: No acute distress.   Neck: No JVD Cardiac: RRR, no murmurs, rubs, or gallops.  Respiratory: Clear to auscultation bilaterally. GI: Soft, nontender, non-distended  MS: No edema; No deformity. Neuro:  Nonfocal  Psych: Normal affect   Labs    High Sensitivity Troponin:   Recent Labs  Lab 07/30/23 1200 07/30/23 1726  TROPONINIHS 18* 23*     Chemistry Recent Labs  Lab 07/30/23 1200 07/31/23 0652  NA 138 138  K 3.6 3.4*  CL 107 102  CO2 21* 22  GLUCOSE 121* 113*  BUN 16 20   CREATININE 1.18* 1.26*  CALCIUM 9.1 8.9  MG 2.3  --   PROT  --  6.8  ALBUMIN  --  3.8  AST  --  53*  ALT  --  52*  ALKPHOS  --  40  BILITOT  --  0.9  GFRNONAA 45* 42*  ANIONGAP 10 14    Lipids No results for input(s): "CHOL", "TRIG", "HDL", "LABVLDL", "LDLCALC", "CHOLHDL" in the last 168 hours.  Hematology Recent Labs  Lab 07/30/23 1200 07/31/23 0652  WBC 7.4 7.5  RBC 3.74* 3.96  HGB 11.6* 12.3  HCT 36.3 38.6  MCV 97.1 97.5  MCH 31.0 31.1  MCHC 32.0 31.9  RDW 13.4 13.3  PLT 141* 182   Thyroid No results for input(s): "TSH", "FREET4" in the last 168 hours.  BNP Recent Labs  Lab 07/30/23 1200  BNP 4,411.4*    DDimer No results for input(s): "DDIMER" in the last 168 hours.   Radiology    DG Chest Port 1 View Result Date: 07/30/2023 CLINICAL DATA:  88 year old female with shortness of breath. Atrial fibrillation. EXAM: PORTABLE CHEST 1 VIEW COMPARISON:  Chest radiographs 07/07/2023 and  earlier. FINDINGS: Portable AP semi upright view at 1134 hours. Unresolved veiling bilateral lung base pleural effusions with increased fluid tracking into the right minor fissure now. Underlying cardiomegaly. Stable visible mediastinal contours. Upper lung pulmonary vascularity appears stable to improved, no overt edema. No pneumothorax. No air bronchograms. No acute osseous abnormality identified. Paucity of visible bowel gas. IMPRESSION: 1. Ongoing small to moderate bilateral pleural effusions with increased fluid tracking into the right minor fissure since last month. 2. Cardiomegaly. No overt edema. No new cardiopulmonary abnormality. Electronically Signed   By: Odessa Fleming M.D.   On: 07/30/2023 11:56    Cardiac Studies   None  Patient Profile     Cindy Meza is a 88 y.o. female with a hx of fatigue, orthostatic intolerance, mitral regurgitation, asthma, GERD, and aortic atherosclerosis seen on previous CT of abdomen who is being seen 07/30/2023 for the evaluation of SOB at the  request of Dr. Alinda Money.   Assessment & Plan    1: Acute systolic heart failure-EF reduced since 08/10/2017 from 50 to 55% down to 25% on 07/25/2023 with severe MR.  Her BNP was elevated over 4000.  She was diuresed and feels clinically improved.  Plan right and left heart cath today.     For questions or updates, please contact [facility] Please consult [email or web address] for contact info under        Signed, Nanetta Batty, MD  07/31/2023, 9:23 AM

## 2023-08-01 ENCOUNTER — Encounter (HOSPITAL_COMMUNITY): Payer: Self-pay | Admitting: Cardiovascular Disease

## 2023-08-01 DIAGNOSIS — K219 Gastro-esophageal reflux disease without esophagitis: Secondary | ICD-10-CM | POA: Diagnosis not present

## 2023-08-01 DIAGNOSIS — J4520 Mild intermittent asthma, uncomplicated: Secondary | ICD-10-CM | POA: Diagnosis not present

## 2023-08-01 DIAGNOSIS — I428 Other cardiomyopathies: Secondary | ICD-10-CM

## 2023-08-01 DIAGNOSIS — N1831 Chronic kidney disease, stage 3a: Secondary | ICD-10-CM | POA: Diagnosis not present

## 2023-08-01 DIAGNOSIS — I340 Nonrheumatic mitral (valve) insufficiency: Secondary | ICD-10-CM | POA: Diagnosis not present

## 2023-08-01 DIAGNOSIS — I5023 Acute on chronic systolic (congestive) heart failure: Secondary | ICD-10-CM | POA: Diagnosis not present

## 2023-08-01 DIAGNOSIS — E43 Unspecified severe protein-calorie malnutrition: Secondary | ICD-10-CM

## 2023-08-01 LAB — BASIC METABOLIC PANEL
Anion gap: 9 (ref 5–15)
BUN: 19 mg/dL (ref 8–23)
CO2: 26 mmol/L (ref 22–32)
Calcium: 8.9 mg/dL (ref 8.9–10.3)
Chloride: 105 mmol/L (ref 98–111)
Creatinine, Ser: 1.23 mg/dL — ABNORMAL HIGH (ref 0.44–1.00)
GFR, Estimated: 43 mL/min — ABNORMAL LOW (ref >60)
Glucose, Bld: 90 mg/dL (ref 70–99)
Potassium: 3.7 mmol/L (ref 3.5–5.1)
Sodium: 140 mmol/L (ref 135–145)

## 2023-08-01 LAB — MAGNESIUM: Magnesium: 2.2 mg/dL (ref 1.7–2.4)

## 2023-08-01 MED ORDER — SPIRONOLACTONE 12.5 MG HALF TABLET
12.5000 mg | ORAL_TABLET | Freq: Every day | ORAL | Status: DC
Start: 2023-08-02 — End: 2023-08-03
  Administered 2023-08-02 – 2023-08-03 (×2): 12.5 mg via ORAL
  Filled 2023-08-01 (×2): qty 1

## 2023-08-01 MED ORDER — FUROSEMIDE 40 MG PO TABS: 40.0000 mg | ORAL_TABLET | Freq: Every day | ORAL | Status: DC

## 2023-08-01 MED ORDER — EMPAGLIFLOZIN 10 MG PO TABS
10.0000 mg | ORAL_TABLET | Freq: Every day | ORAL | Status: DC
  Administered 2023-08-02 – 2023-08-03 (×2): 10 mg via ORAL
  Filled 2023-08-01 (×2): qty 1

## 2023-08-01 MED ORDER — POTASSIUM CHLORIDE CRYS ER 20 MEQ PO TBCR
40.0000 meq | EXTENDED_RELEASE_TABLET | Freq: Once | ORAL | Status: AC
Start: 2023-08-01 — End: 2023-08-01
  Administered 2023-08-01: 40 meq via ORAL
  Filled 2023-08-01: qty 2

## 2023-08-01 MED ORDER — ENSURE ENLIVE PO LIQD: 237.0000 mL | Freq: Two times a day (BID) | ORAL | Status: DC

## 2023-08-01 NOTE — Progress Notes (Signed)
Initial Nutrition Assessment  DOCUMENTATION CODES:   Severe malnutrition in context of acute illness/injury  INTERVENTION:  -Ensure Enlive po BID, each supplement provides 350 kcal and 20 grams of protein. -Magic cup TID with meals, each supplement provides 290 kcal and 9 grams of protein -Briefly discussed ways to increase nutritional intake -Added "High calorie high protein" handout to AVS  NUTRITION DIAGNOSIS:   Severe Malnutrition related to acute illness (new heart failure diagnosis) as evidenced by severe muscle depletion, severe fat depletion.  GOAL:   Patient will meet greater than or equal to 90% of their needs   MONITOR:   PO intake, Supplement acceptance, Weight trends  REASON FOR ASSESSMENT:   Other (Comment) (low BMI)    ASSESSMENT:   Pt presented with SOB and admitted for acute systolic heart failure. Pt with PMH of asthma, GERD, aortic atherosclerosis, fatigue, orthostatic intolerance, mitral regurgitation.   1/22: Right/left heart cath angiography-diagnosis of CHF  Spoke with pt at bedside. Question pt's nutrition hx given flowsheet report of orientation x2 and noted changes in mentation per MD notes today. Pt reports having a great appetite both at home and in the hospital. Pt reports eating 75-100% of her meals. She reports following a low sodium diet and has always tried to eat healthy. Pt reports cooking and preparing meals for herself. Pt reports having 3 meals a day. B: cereal with raisins, L: sandwich and soup with fruit, D: meat, greens, salads, S: 2 prunes a day. Pt reports she will occasionally have a protein shake from the brand "protein one". She reports taking a multivitamin, B12, Vitamin D, and calcium.   Pt reports a usual body weight of 98 lbs. She reports being this weight for as long as she can remember and says it has been years.  Pt reports wanting to gain weight. Per chart review pt was 102 lbs from 10/24-12/24 and 97 lb from 12/24-1/25. She  currently weighs 90 lb. This is a 11.5% loss in 1-2 months which is clinically significant.   Despite pt report of stable weight and appetite, severe malnutrition is likely more chronic in nature given hx of weight loss. Briefly reviewed chart hx, although pt has reported stable wt noted that she has been followed over the last 3 years for weight loss.   Meds: Reviewed  Labs: Potassium low  NUTRITION - FOCUSED PHYSICAL EXAM:  Flowsheet Row Most Recent Value  Orbital Region Mild depletion  Upper Arm Region Severe depletion  Thoracic and Lumbar Region Severe depletion  Buccal Region Mild depletion  Temple Region Severe depletion  Clavicle Bone Region Severe depletion  Clavicle and Acromion Bone Region Severe depletion  Scapular Bone Region Severe depletion  Dorsal Hand Severe depletion  Patellar Region Severe depletion  Anterior Thigh Region Severe depletion  Posterior Calf Region Severe depletion  Edema (RD Assessment) None  Hair Reviewed  Eyes Reviewed  Mouth Reviewed  Skin Reviewed  Nails Reviewed       Diet Order:   Diet Order             Diet Heart Room service appropriate? Yes; Fluid consistency: Thin  Diet effective now                   EDUCATION NEEDS:   Education needs have been addressed  Skin:  Skin Assessment: Reviewed RN Assessment  Last BM:  1/23  Height:   Ht Readings from Last 1 Encounters:  07/30/23 5\' 1"  (1.549 m)  Weight:   Wt Readings from Last 1 Encounters:  07/31/23 41.3 kg    Ideal Body Weight:  47.7 kg  BMI:  Body mass index is 17.2 kg/m.  Estimated Nutritional Needs:   Kcal:  1300-1500  Protein:  65-75 g  Fluid:  > 1.5 L    Cindy Pro, MS Dietetic Intern

## 2023-08-01 NOTE — Assessment & Plan Note (Signed)
Continue nutritional supplements.  Patient lives alone with apparently no family support, will call his contact information in medical records.  She will need home health services.

## 2023-08-01 NOTE — TOC Initial Note (Signed)
Transition of Care [facility]) - Initial/Assessment Note    Patient Details  Name: Cindy Meza MRN: [id number] Date of Birth: 1935-12-30  Transition of Care [facility]) CM/SW Contact:    Leone Haven, RN Phone Number: 08/01/2023, 3:37 PM  Clinical Narrative:                 From home alone, has PCP and insurance on file, states has no HH services in place at this time or DME at home.  States cousin, Manus Gunning ,will transport them home at [employer] and she  is support system, states gets medications from [facility] on [address].  Pta self ambulatory. NCM offered choice for [facility] for CHF disease management, she has no preference.  NCM made referral to [facility] with [facility]. She is able to take referral.  Soc will begin 24 to 48 hrs post dc.   Expected Discharge Plan: Home w Home Health Services Barriers to Discharge: Continued Medical Work up   Patient Goals and CMS Choice Patient states their goals for this hospitalization and ongoing recovery are:: return home CMS Medicare.gov Compare Post Acute Care list provided to:: Patient Choice offered to / list presented to : Patient      Expected Discharge Plan and Services In-house Referral: NA Discharge Planning Services: CM Consult Post Acute Care Choice: Home Health Living arrangements for the past 2 months: Single Family Home                 DME Arranged: N/A DME Agency: NA       HH Arranged: RN HH Agency: [facility] Date HH Agency Contacted: 08/01/23 Time HH Agency Contacted: 1530 Representative spoke with at [facility] Agency: Tresa Endo  Prior Living Arrangements/Services Living arrangements for the past 2 months: Single Family Home Lives with:: Self Patient language and need for interpreter reviewed:: Yes Do you feel safe going back to the place where you live?: Yes      Need for Family Participation in Patient Care: Yes (Comment) Care giver support system in place?: Yes (comment)   Criminal Activity/Legal Involvement  Pertinent to Current Situation/Hospitalization: No - Comment as needed  Activities of Daily Living   ADL Screening (condition at time of admission) Independently performs ADLs?: Yes (appropriate for developmental age) Is the patient deaf or have difficulty hearing?: No Does the patient have difficulty seeing, even when wearing glasses/contacts?: No Does the patient have difficulty concentrating, remembering, or making decisions?: No  Permission Sought/Granted Permission sought to share information with : Case Manager Permission granted to share information with : Yes, Verbal Permission Granted     Permission granted to share info w AGENCY: HH        Emotional Assessment Appearance:: Appears stated age Attitude/Demeanor/Rapport: Engaged Affect (typically observed): Appropriate Orientation: : Oriented to Self, Oriented to Place, Oriented to  Time, Oriented to Situation Alcohol / Substance Use: Not Applicable Psych Involvement: No (comment)  Admission diagnosis:  Acute systolic CHF (congestive heart failure) (HCC) [I50.21] Acute on chronic congestive heart failure, unspecified heart failure type (HCC) [I50.9] Patient Active Problem List   Diagnosis Date Noted   Protein-calorie malnutrition, severe 08/01/2023   Acute on chronic systolic CHF (congestive heart failure) (HCC) 07/30/2023   Severe mitral regurgitation 07/30/2023   Insomnia 03/27/2023   OAB (overactive bladder) 03/27/2023   BMI less than 19,adult 06/27/2021   Anemia of chronic disease 03/28/2021   Stage 3a chronic kidney disease (CKD) (HCC) 03/28/2021   Low back pain 07/20/2020   Constipation 04/29/2020  Anxiety 01/03/2020   Rash 07/29/2019   Aortic regurgitation 06/16/2018   Fall 06/12/2016   Fatigue 02/15/2016   DOE (dyspnea on exertion) 02/15/2016   Headache 09/27/2015   Coronary atherosclerosis 06/29/2015   Paresthesias 09/21/2014   Breast pain, left 09/21/2014   Orthostatic hypotension 07/17/2012    Allergic rhinitis 09/12/2011   Well adult exam 02/14/2011   HEMORRHOIDS, WITH BLEEDING 10/05/2009   Vitamin D deficiency 06/17/2007   Asthma 06/17/2007   GERD (gastroesophageal reflux disease) 06/17/2007   OSTEOARTHRITIS 06/17/2007   Osteoporosis 06/17/2007   History of colonic polyps 06/17/2007   PCP:  Tresa Garter, MD Pharmacy:   [facility] [phone] Ginette Otto,  - [address] AT [facility] [address] [place] [address] Phone: [phone] Fax: [phone]  [facility] [address] [place] [address] Phone: [phone] Fax: [phone]     Social Drivers of Health (SDOH) Social History: SDOH Screenings   Food Insecurity: No Food Insecurity (07/30/2023)  Housing: Low Risk  (07/30/2023)  Transportation Needs: No Transportation Needs (07/30/2023)  Utilities: Not At Risk (07/30/2023)  Alcohol Screen: Low Risk  (08/01/2023)  Depression (PHQ2-9): Low Risk  (06/26/2023)  Financial Resource Strain: Low Risk  (08/01/2023)  Physical Activity: Inactive (08/27/2022)  Social Connections: Moderately Integrated (07/30/2023)  Stress: No Stress Concern Present (08/27/2022)  Tobacco Use: Low Risk  (07/30/2023)   SDOH Interventions: Alcohol Usage Interventions: Intervention Not Indicated (Score <7) Financial Strain Interventions: Intervention Not Indicated   Readmission Risk Interventions     No data to display

## 2023-08-01 NOTE — Progress Notes (Signed)
Heart Failure Nurse Navigator Progress Note  PCP: [name], [name], MD PCP-Cardiologist: Tenny Craw Admission Diagnosis: Acute on Chronic congestive Heart Failure Admitted from: Home via EMS  Presentation:   Cindy Meza presented with shortness of breath x 1 week, swelling in her feet and ankles, reported to taking her lasix PRN , last dose was 2 days prior. BP 133/104, HR 91, BNP 4,411, CXR with bilateral pleural effusions, Underwent right/left heart cath 1/22 with mild nonobstructive CAD, severely elevated filling pressures, moderate to severe pulmonary hypertension, LVEDP 40 mmHg .  Patient was educated on the sign and symptoms of heart failure, daily weights, when to call her doctor or go to the ED, Diet/ fluid restrictions, reports to drinking "lots " of water daily, continued education on taking all medications as prescribed and attending all medical appointments. Pateint verbalized her understanding of education, a HF TOC appointment was scheduled for 08/12/2023 @ 12 noon.   ECHO/ LVEF: 25-30%  Clinical Course:  Past Medical History:  Diagnosis Date   Abdominal pain 03/30/2015   Dr Leone Payor     /16, 1/21 ?fosamax related  S/p gyn eval, abd CT (-)  11/16 50% better  Better off Fosamax, off Miralax   12/22 Recurrent abd pain - pt saw Dr Leone Payor; CT w/constipation - using Miralax and Benefiber - better now     Allergy    Anemia    Asthma    Dr. Sherene Sires   Blood transfusion without reported diagnosis    Breast cyst    Left   Cataract    Dr. Nile Riggs   Cerumen impaction 04/06/2016   9/17 L, 10/19 B     Chest pressure 07/22/2012   1/14, 8/17  Recurrent - ?asthma related (11/17)  Advair MDI - use qd, not prn  We added Singulair  Cardiac w/up was (-) - --- Dr Tenny Craw     Closed right hip fracture (HCC) 06/12/2016   Dr Ave Filter  R hip fx - intermedullary nail 06/13/17 s/p   On Prolia, calcium, Vit D        Complication of anesthesia    ' I HAD HALLUCINATIONS WHEN THEY DID MY HIP "   GERD  (gastroesophageal reflux disease)    Heart murmur    Hemorrhoids    History of colonic polyps Gessner   hx of adenomas (small) 1998   Hot flash, menopausal 02/15/2012   Ongoing   D/c estrogens due to breast abnormalities 2013     Nausea 02/12/2019   8/20 d/c Midodrin, Florinef  Occasional.   Nausea after taking Pantoprazole - take w/food     OA (osteoarthritis)    Orthostatic hypotension    Osteopenia    PAC (premature atrial contraction)    Upper respiratory infection 06/13/2023   Zpac if worse     Vitamin D deficiency      Social History   Socioeconomic History   Marital status: Widowed    Spouse name: Not on file   Number of children: Not on file   Years of education: Not on file   Highest education level: Not on file  Occupational History   Occupation: retired  Tobacco Use   Smoking status: Never   Smokeless tobacco: Never  Vaping Use   Vaping status: Never Used  Substance and Sexual Activity   Alcohol use: Not Currently    Alcohol/week: 0.0 standard drinks of alcohol   Drug use: No   Sexual activity: Not Currently  Other Topics Concern  Not on file  Social History Narrative   She lives alone in a [place] home.  No children.   Retired Child psychotherapist.   Highest level of education:  Masters degree      Right Handed    Lives in one story home / lives alone    Social Drivers of Health   Financial Resource Strain: Low Risk  (08/27/2022)   Overall Financial Resource Strain (CARDIA)    Difficulty of Paying Living Expenses: Not hard at all  Food Insecurity: No Food Insecurity (07/30/2023)   Hunger Vital Sign    Worried About Running Out of Food in the Last Year: Never true    Ran Out of Food in the Last Year: Never true  Transportation Needs: No Transportation Needs (07/30/2023)   PRAPARE - Administrator, Civil Service (Medical): No    Lack of Transportation (Non-Medical): No  Physical Activity: Inactive (08/27/2022)   Exercise Vital Sign    Days  of Exercise per Week: 0 days    Minutes of Exercise per Session: 0 min  Stress: No Stress Concern Present (08/27/2022)   [employer] of Occupational Health - Occupational Stress Questionnaire    Feeling of Stress : Not at all  Social Connections: Moderately Integrated (07/30/2023)   Social Connection and Isolation Panel [NHANES]    Frequency of Communication with Friends and Family: More than three times a week    Frequency of Social Gatherings with Friends and Family: Twice a week    Attends Religious Services: More than 4 times per year    Active Member of [employer] or Organizations: Yes    Attends Banker Meetings: More than 4 times per year    Marital Status: Widowed   Education Assessment and Provision:  Detailed education and instructions provided on heart failure disease management including the following:  Signs and symptoms of Heart Failure When to call the physician Importance of daily weights Low sodium diet Fluid restriction Medication management Anticipated future follow-up appointments  Patient education given on each of the above topics.  Patient acknowledges understanding via teach back method and acceptance of all instructions.  Education Materials:  "Living Better With Heart Failure" Booklet, HF zone tool, & Daily Weight Tracker Tool.  Patient has scale at home: Yes Patient has pill box at home: Yes    High Risk Criteria for Readmission and/or Poor Patient Outcomes: Heart failure hospital admissions (last 6 months): 1  No Show rate: 1 % Difficult social situation: Lives alone Demonstrates medication adherence: Yes Primary Language: English Literacy level: Reading, writing, and comprehension  Barriers of Care:   Diet/ fluid restrictions ( drinks a lot of water daily)  Daily weights  Considerations/Referrals:   Referral made to Heart Failure Pharmacist Stewardship: yes Referral made to Heart Failure CSW/NCM TOC: No Referral made to Heart &  Vascular TOC clinic: Yes. 08/12/2023 @ 12 noon   Items for Follow-up on DC/TOC: Continued HF education Diet/ fluid restrictions ( reported to drinking a lot of water daily)  Daily weights   Rhae Hammock, BSN, RN Heart Failure Teacher, adult education Only

## 2023-08-01 NOTE — Progress Notes (Addendum)
Patient Name: Cindy Meza Date of Encounter: 08/01/2023 [facility] Cardiologist: Dietrich Pates, MD   Interval Summary  .    Refusing all care this morning, has covers pulled over her head and refuses to engage in conversation.   Vital Signs .    Vitals:   07/31/23 1254 07/31/23 1958 08/01/23 0008 08/01/23 0715  BP: 125/86 130/86 112/85   Pulse: (!) 0 98    Resp:  17 18 16   Temp:  (!) 97.5 F (36.4 C) 98.3 F (36.8 C)   TempSrc:  Oral Oral   SpO2:  97% 97%   Weight:      Height:        Intake/Output Summary (Last 24 hours) at 08/01/2023 6578 Last data filed at 08/01/2023 0130 Gross per 24 hour  Intake 360 ml  Output 700 ml  Net -340 ml      08/01/2023    6:29 AM 07/31/2023   11:08 AM 07/30/2023   11:18 AM  Last 3 Weights  Weight (lbs) -- 91 lb 0.8 oz 98 lb  Weight (kg) -- 41.3 kg 44.453 kg      Telemetry/ECG    Off telemetry - Personally Reviewed  Physical Exam .    Refused  Assessment & Plan .     88 y.o. female with a hx of fatigue, orthostatic intolerance, mitral regurgitation, asthma, GERD, and aortic atherosclerosis seen on previous CT of abdomen who was seen 07/30/2023 for the evaluation of SOB at the request of Dr. Alinda Money.   Acute HFrEF -- echo 1/16 with LVEF of 25%, global hypokinesis, mildly reduced RV, severe left atrial enlargement, mildly enlarged right atrium, small pericardial effusion, severe MR -- Underwent right/left heart cath 1/22 with mild nonobstructive CAD, severely elevated filling pressures, moderate to severe pulmonary hypertension, LVEDP 40 mmHg -- On IV Lasix 20 mg twice daily, net -2.4 L -- GDMT: she is not optimized, on no therapy at this time. If she is agreeable would add low dose Toprol/ARB but currently refusing all care  Severe MR -- As above left heart cath with mild nonobstructive CAD -- Wedge pressure 35, but no large V waves on cath -- as above, refusing care. Can consider for outpatient structural  consult if agreeable  CKD stage 3a -- Cr 1.2 yesterday, refused labs this morning  Hypokalemia -- K+ 3.2 yesterday, refused labs  This morning per discussion with RN, patient is refusing all care. She refuses to engage in conversation. In talking with MD this is a significant change in her mental status from yesterday.  For questions or updates, please contact [facility] Please consult [email or web address] for contact info under        Signed, Laverda Page, NP   Agree with note by Laverda Page NP-C  Patient had significant change in mental status since I saw her yesterday.  She is now covering her head with a blanket and refusing to engage or receive care.  Unclear whether this is sundowning.  She is on Dr. Marin Comment service.  She has LV dysfunction with severe MR probably contributing to that.  She does need GDMT but has not allowed [place] to pursue this.  She would also benefit from a structural heart consult which we can discuss as an outpatient if she decides to follow-up.  Will sign off for now.  Please arrange follow-up with [place] at discharge.  Runell Gess, M.D., FACP, [facility], [facility] [address]  [address] [place], [place]  [address]  [phone] 08/01/2023 10:23 AM

## 2023-08-01 NOTE — Progress Notes (Addendum)
Progress Note   Patient: Cindy Meza ZOX:[id number] DOB: [birth year] DOA: 07/30/2023     2 DOS: the patient was seen and examined on 08/01/2023   Brief hospital course: Cindy Meza was admitted to the hospital with the working diagnosis of heart failure exacerbation.   88 y.o. female with medical history significant of GERD, asthma, anemia, OAB, CKD 3A, orthostatic hypotension, low back pain, anxiety, insomnia presenting with worsening shortness of breath.   Patient has been recently undergoing workup for possible CHF.  She presented to the ED on 12/10 with shortness of breath with an elevated BNP and pleural effusions.  Admission was offered but patient declined and has since followed up with PCP and cardiology and had outpatient echocardiogram performed last couple days but has not seen cardiology since it was performed.   Initially was on Lasix as needed for edema and had been taking it more frequently recently but after initial improvement on this she has had worsening shortness of breath again.  Also with pedal edema.   For EMS, initially desaturated on ambulation and was noted to have heart rates in the 90s to 170s that were irregular, no significant tachycardia in the ED.   01/23 patient with improvement in volume status, she has been declining medications and refused to talk to the heart team this morning.  Improved compliance through the course of the day.  Having ectopy on telemetry   Assessment and Plan: * Acute on chronic systolic CHF (congestive heart failure) (HCC) Echocardiogram with reduced LV systolic function with EF 25%, LV with global hypokinesis, LV cavity mild dilatation, mild LVH, RV systolic function with mild reduction, RVSP 50,8 mmHg. LA with severe dilatation, RA with mild dilatation, small pericardial effusion, severe mitral valve regurgitation.   01/22 cardiac catheterization  RA 13 RV 53/18  PCWP mean 32 PA 58/35 mean 45  Cardiac output 3,19 and index  2.27 (Fick).   Urine output 1,050 ml Systolic blood pressure 130's   Plan to continue diuresis, transition to po furosemide 40 mg daily  Add spironolactone in am, and if blood pressure allows will add ARB,  Continue to target negative fluid balance.   Acute hypoxemic respiratory failure  Improved oxygenation, today 02 saturation is 97% on room air.   Patient having ectopy on telemetry, will keep K at 4 and Mg at 2,   Stage 3a chronic kidney disease (CKD) (HCC) Hypokalemia.   Renal function with serum cr at 1,23 with K at 3,7 and serum bicarbonate at 26  Na 140  Mg 2.2   Plan to continue with oral loop diuretic and will add spironolactone in am,  Add 40 meq Kcl po to avoid hypokalemia   Asthma No acute exacerbation.   GERD (gastroesophageal reflux disease) Continue PPI   Anxiety Continue with as needed lorazepam.   Anemia of chronic disease Stable cell count.   Protein-calorie malnutrition, severe Continue nutritional supplements.  Patient lives alone with apparently no family support, will call his contact information in medical records.  She will need home health services.     Subjective: At the time of my examination she is more cooperative, she has improvement in her dyspnea, with no PND or orthopnea, no edema   Physical Exam: Vitals:   07/31/23 1254 07/31/23 1958 08/01/23 0008 08/01/23 0715  BP: 125/86 130/86 112/85   Pulse: (!) 0 98    Resp:  17 18 16   Temp:  (!) 97.5 F (36.4 C) 98.3 F (36.8 C)  TempSrc:  Oral Oral   SpO2:  97% 97%   Weight:      Height:       Neurology awake and alert ENT with mild pallor Cardiovascular with S1 and S2 present and regular with no gallops or rubs, positive systolic murmur at the apex Positive JVD No lower extremity edema Respiratory with no rales or wheezing, no rhonchi Abdomen with no distention  Data Reviewed:    Family Communication: no family at the bedside   Disposition: Status is: Inpatient Remains  inpatient appropriate because: heart failure   Planned Discharge Destination: Home     Author: Coralie Keens, MD 08/01/2023 8:42 AM  For on call review [email or web address].

## 2023-08-01 NOTE — Progress Notes (Addendum)
Heart Failure Stewardship Pharmacist Progress Note   PCP: [name], [name], MD PCP-Cardiologist: Dietrich Pates, MD    HPI:  88 yo F with PMH GERD, asthma, anemia, OAB, CKD IIIa, MR, orthostatic hypotension on fludrocortisone, anxiety, insomnia. Echocardiogram in 2019 showed EF 50 to 55%, mild diastolic dysfunction, moderate MR. Aortic atherosclerosis noted on imaging in 2016. Previously seen in the ED in 06/2023 for DOE and was prescribed ABX, but also noted to have worsening bilateral LEE and BNP was elevated. She declined admission and was prescribed lasix at discharge. She has a history of sensitivity to medications and orthostatic hypotension so lasix was only prn. ECHO from 1/16 with EF reduced to 25%, global hypokinesis, LV mildly dilated, mild LVH, RV function mildly reduced, moderately elevated PASP, left atria severely dilated, right atria mildly dilated, small pericardial effusion, severe MR, trivial AR.   She presented to the ED on 1/21 with worsening SOB. BNP 4411, trop 23. Newly diagnosed HF with evidence of fluid overload. Concern for ICM versus severe MR contributing to reduced EF. Morning of 1/22 patient with minimal output since admission and place on BiPAP. Received an additional dose of furosemide 60 mg IV once. Cath on 1/22 showed severely elevated filling pressures (RA 13, PW 35, PA mean 45), moderate to severe pulmonary hypertension, and normal CI. No evidence of obstructive disease, felt that reduced EF is due to severe MR.  Patient stated she only took furosemide once this past week since last ED visit in 06/2023. At home she says she has off and on lightheadedness/dizziness. Generally says she feels unsteady on her feet and sometimes feels like she is going to fall. Endorses one time in the past year that she did have a fall.   Patient reports feeling well today. Denies SOB and is able to lay flat, not on oxygen. Patient laying with legs elevated in bed. Trace LEE. States  she is not a morning person and that is her reason for declining care this morning.  Current HF Medications: Diuretic: furosemide 20 mg IV BID  Prior to admission HF Medications: Diuretic: furosemide 20 mg daily prn edema Other: potassium 10 mEq daily when she taking furosemide  Pertinent Lab Values: Serum creatinine 1.26, BUN 20, Potassium 3.4, Sodium 138, BNP 4411.4,  1/21: Magnesium 2.3  Vital Signs: Weight: 91 lbs (admission weight: 98 lbs) Blood pressure: 90-130/70-80s  Heart rate: 80-100 I/O: net -2.1 L yesterday; net -2.4 L since admission  Medication Assistance / Insurance Benefits Check: Does the patient have prescription insurance?  Yes Type of insurance plan: Medicare  Outpatient Pharmacy:  Prior to admission outpatient pharmacy: [facility] Is the patient willing to use [facility] TOC pharmacy at discharge? Yes  Is the patient willing to transition their outpatient pharmacy to utilize a [facility] outpatient pharmacy?   No    Assessment: 1. Acute systolic CHF (LVEF 25%), due to severe MR. NYHA class II symptoms. - Continue furosemide 20 mg IV BID. Patient denies SOB today. Cath yesterday showing severe volume overload. She refused labs and lasix this morning as she was sleeping. Can likely can discontinue or transition to PO lasix tomorrow pending labs and initiation of Jardiance. - Recommend to start Jardiance 10 mg daily tomorrow for GDMT optimization and fluid control pending labs. Discussed with patient and she is open to trying this. Not currently with any s/sx of hypotension and BP has been stable. She denies history of UTIs or VVC. Favor addition of SGLT-2 given less effect on  BP than RAAS therapy or BB.  - Hold off on initiation of BB and ACE/ARB/ARNI due to orthostatic hypotension - Daily weights, strict I/Os, keep Mg>2 and K>4 - replaced with K 40 mEq x2 today   Plan: 1) Medication changes recommended at this time: - Recommend to start Jardiance 10 mg daily tomorrow  pending labs   2) Patient assistance: - Jardiance/Farxiga $35   3)  Education  - Patient has been educated on current HF medications and potential additions to HF medication regimen - Patient verbalizes understanding that over the next few months, these medication doses may change and more medications may be added to optimize HF regimen - Patient has been educated on basic disease state pathophysiology and goals of therapy    Jarrett Ables, PharmD PGY-1 Pharmacy Resident   Sharen Hones, PharmD, BCPS Heart Failure Stewardship Pharmacist Phone [phone]

## 2023-08-01 NOTE — Discharge Instructions (Signed)

## 2023-08-01 NOTE — Progress Notes (Signed)
Mobility Specialist Progress Note:   08/01/23 1013  Mobility  Activity Ambulated with assistance in room  Level of Assistance Standby assist, set-up cues, supervision of patient - no hands on  Assistive Device None;Front wheel walker  Distance Ambulated (ft) 30 ft  Activity Response Tolerated well  Mobility Referral Yes  Mobility visit 1 Mobility  Mobility Specialist Start Time (ACUTE ONLY) 1013  Mobility Specialist Stop Time (ACUTE ONLY) 1025  Mobility Specialist Time Calculation (min) (ACUTE ONLY) 12 min   Responded to bed alarm, pt moving around room with no assistance. Pt with incr confusion this date, not following simple cues/demands. Required only supervision throughout ambulation in room with and without AD use. Pt redirected to bed, bed alarm back on.  Addison Lank Mobility Specialist Please contact via SecureChat or  Rehab office at [phone]

## 2023-08-02 DIAGNOSIS — I5023 Acute on chronic systolic (congestive) heart failure: Secondary | ICD-10-CM | POA: Diagnosis not present

## 2023-08-02 DIAGNOSIS — K219 Gastro-esophageal reflux disease without esophagitis: Secondary | ICD-10-CM | POA: Diagnosis not present

## 2023-08-02 DIAGNOSIS — J4520 Mild intermittent asthma, uncomplicated: Secondary | ICD-10-CM | POA: Diagnosis not present

## 2023-08-02 DIAGNOSIS — N1831 Chronic kidney disease, stage 3a: Secondary | ICD-10-CM | POA: Diagnosis not present

## 2023-08-02 DIAGNOSIS — I340 Nonrheumatic mitral (valve) insufficiency: Secondary | ICD-10-CM | POA: Diagnosis not present

## 2023-08-02 LAB — CBC
HCT: 33.2 % — ABNORMAL LOW (ref 36.0–46.0)
Hemoglobin: 10.7 g/dL — ABNORMAL LOW (ref 12.0–15.0)
MCH: 31 pg (ref 26.0–34.0)
MCHC: 32.2 g/dL (ref 30.0–36.0)
MCV: 96.2 fL (ref 80.0–100.0)
Platelets: 172 10*3/uL (ref 150–400)
RBC: 3.45 MIL/uL — ABNORMAL LOW (ref 3.87–5.11)
RDW: 13.5 % (ref 11.5–15.5)
WBC: 5.7 10*3/uL (ref 4.0–10.5)
nRBC: 0 % (ref 0.0–0.2)

## 2023-08-02 LAB — BASIC METABOLIC PANEL
Anion gap: 7 (ref 5–15)
BUN: 22 mg/dL (ref 8–23)
CO2: 25 mmol/L (ref 22–32)
Calcium: 9 mg/dL (ref 8.9–10.3)
Chloride: 104 mmol/L (ref 98–111)
Creatinine, Ser: 1.33 mg/dL — ABNORMAL HIGH (ref 0.44–1.00)
GFR, Estimated: 39 mL/min — ABNORMAL LOW (ref >60)
Glucose, Bld: 105 mg/dL — ABNORMAL HIGH (ref 70–99)
Potassium: 4.7 mmol/L (ref 3.5–5.1)
Sodium: 136 mmol/L (ref 135–145)

## 2023-08-02 MED FILL — Verapamil HCl IV Soln 2.5 MG/ML: INTRAVENOUS | Qty: 2 | Status: AC

## 2023-08-02 NOTE — Plan of Care (Signed)
  Problem: Education: Goal: Ability to demonstrate management of disease process will improve Outcome: Progressing Goal: Ability to verbalize understanding of medication therapies will improve Outcome: Progressing Goal: Individualized Educational Video(s) Outcome: Progressing   Problem: Activity: Goal: Capacity to carry out activities will improve Outcome: Progressing   Problem: Cardiac: Goal: Ability to achieve and maintain adequate cardiopulmonary perfusion will improve Outcome: Progressing   Problem: Education: Goal: Knowledge of General Education information will improve Description: Including pain rating scale, medication(s)/side effects and non-pharmacologic comfort measures Outcome: Progressing   Problem: Health Behavior/Discharge Planning: Goal: Ability to manage health-related needs will improve Outcome: Progressing   Problem: Clinical Measurements: Goal: Ability to maintain clinical measurements within normal limits will improve Outcome: Progressing Goal: Will remain free from infection Outcome: Progressing Goal: Diagnostic test results will improve Outcome: Progressing Goal: Respiratory complications will improve Outcome: Progressing Goal: Cardiovascular complication will be avoided Outcome: Progressing   Problem: Activity: Goal: Risk for activity intolerance will decrease Outcome: Progressing   Problem: Nutrition: Goal: Adequate nutrition will be maintained Outcome: Progressing   Problem: Coping: Goal: Level of anxiety will decrease Outcome: Progressing   Problem: Elimination: Goal: Will not experience complications related to bowel motility Outcome: Progressing Goal: Will not experience complications related to urinary retention Outcome: Progressing   Problem: Pain Managment: Goal: General experience of comfort will improve and/or be controlled Outcome: Progressing   Problem: Safety: Goal: Ability to remain free from injury will  improve Outcome: Progressing   Problem: Skin Integrity: Goal: Risk for impaired skin integrity will decrease Outcome: Progressing   Problem: Education: Goal: Understanding of CV disease, CV risk reduction, and recovery process will improve Outcome: Progressing Goal: Individualized Educational Video(s) Outcome: Progressing   Problem: Activity: Goal: Ability to return to baseline activity level will improve Outcome: Progressing   Problem: Cardiovascular: Goal: Ability to achieve and maintain adequate cardiovascular perfusion will improve Outcome: Progressing Goal: Vascular access site(s) Level 0-1 will be maintained Outcome: Progressing   Problem: Health Behavior/Discharge Planning: Goal: Ability to safely manage health-related needs after discharge will improve Outcome: Progressing

## 2023-08-02 NOTE — Progress Notes (Signed)
Progress Note   Patient: Cindy Meza ZOX:[id number] DOB: 15-Apr-1936 DOA: 07/30/2023     3 DOS: the patient was seen and examined on 08/02/2023   Brief hospital course: Mrs. Lacaze was admitted to the hospital with the working diagnosis of heart failure exacerbation.   88 y.o. female with medical history significant of GERD, asthma, anemia, OAB, CKD 3A, orthostatic hypotension, low back pain, anxiety, insomnia presenting with worsening shortness of breath.   Patient has been recently undergoing workup for possible CHF.  She presented to the ED on 12/10 with shortness of breath with an elevated BNP and pleural effusions.  Admission was offered but patient declined and has since followed up with PCP and cardiology and had outpatient echocardiogram performed last couple days but has not seen cardiology since it was performed.   Initially was on Lasix as needed for edema and had been taking it more frequently recently but after initial improvement on this she has had worsening shortness of breath again.  Also with pedal edema.   For EMS, initially desaturated on ambulation and was noted to have heart rates in the 90s to 170s that were irregular, no significant tachycardia in the ED.   01/23 patient with improvement in volume status, she has been declining medications and refused to talk to the heart team this morning.  Improved compliance through the course of the day.  Having ectopy on telemetry  01/124 improved volume status, possible discharge tomorrow.   Assessment and Plan: * Acute on chronic systolic CHF (congestive heart failure) (HCC) Echocardiogram with reduced LV systolic function with EF 25%, LV with global hypokinesis, LV cavity mild dilatation, mild LVH, RV systolic function with mild reduction, RVSP 50,8 mmHg. LA with severe dilatation, RA with mild dilatation, small pericardial effusion, severe mitral valve regurgitation.   01/22 cardiac catheterization  RA 13 RV 53/18   PCWP mean 32 PA 58/35 mean 45  Cardiac output 3,19 and index 2.27 (Fick).   Urine output 600 ml Systolic blood pressure 110's   Continue with SGLT 2 inh and spironolactone  Hold on furosemide for now.   Acute hypoxemic respiratory failure  Improved oxygenation, today 02 saturation is 97% on room air.   Stage 3a chronic kidney disease (CKD) (HCC) Hypokalemia.   Renal function with serum cr at 1,3 with K at 4,7 and serum bicarbonate at 25  Na 136  Mg 2.2   On SGLT 2 inh and spironolactone. Holding loop diuretic for now.  Follow up renal function and electrolytes in am.   Asthma No acute exacerbation.   GERD (gastroesophageal reflux disease) Continue PPI   Anxiety Continue with as needed lorazepam.   Anemia of chronic disease Stable cell count.   Protein-calorie malnutrition, severe Continue nutritional supplements.  Patient lives alone with apparently no family support, will call his contact information in medical records.  She will need home health services.         Subjective: Patient with improvement in dyspnea and edema, has no further PND or orthopnea.   Physical Exam: Vitals:   08/02/23 0420 08/02/23 0713 08/02/23 1053 08/02/23 1500  BP: 108/82 111/78 103/73 109/79  Pulse: 91 82 95 99  Resp: 18 16 20 20   Temp: 98 F (36.7 C) (!) 97.5 F (36.4 C) (!) 97.5 F (36.4 C) (!) 97.2 F (36.2 C)  TempSrc: Oral Oral Oral Oral  SpO2: 98% 97% 97% 97%  Weight: 42.5 kg     Height:  Neurology awake and alert ENT with mild pallor Cardiovascular with S1 and S2 present and regular, positive systolic murmur at the apex with no gallops, or rubs Mild JVD No lower extremity edema, only mild at the ankles.  Respiratory with no rales or wheezing, no rhonchi Abdomen with no distention  Data Reviewed:    Family Communication: I spoke with patient's nice at the bedside, we talked in detail about patient's condition, plan of care and prognosis and all questions  were addressed.   Disposition: Status is: Inpatient Remains inpatient appropriate because: follow up renal function, possible discharge home tomorrow   Planned Discharge Destination: Home      Author: Coralie Keens, MD 08/02/2023 5:18 PM  For on call review [email or web address].

## 2023-08-02 NOTE — Progress Notes (Signed)
Heart Failure Stewardship Pharmacist Progress Note   PCP: [name], [name], MD PCP-Cardiologist: Dietrich Pates, MD    HPI:  88 yo F with PMH GERD, asthma, anemia, OAB, CKD IIIa, MR, orthostatic hypotension on fludrocortisone, anxiety, insomnia. Echocardiogram in 2019 showed EF 50 to 55%, mild diastolic dysfunction, moderate MR. Aortic atherosclerosis noted on imaging in 2016. Previously seen in the ED in 06/2023 for DOE and was prescribed ABX, but also noted to have worsening bilateral LEE and BNP was elevated. She declined admission and was prescribed lasix at discharge. She has a history of sensitivity to medications and orthostatic hypotension so lasix was only prn. ECHO from 1/16 with EF reduced to 25%, global hypokinesis, LV mildly dilated, mild LVH, RV function mildly reduced, moderately elevated PASP, left atria severely dilated, right atria mildly dilated, small pericardial effusion, severe MR, trivial AR.   She presented to the ED on 1/21 with worsening SOB. BNP 4411, trop 23. Newly diagnosed HF with evidence of fluid overload. Concern for ICM versus severe MR contributing to reduced EF. Morning of 1/22 patient with minimal output since admission and place on BiPAP. Received an additional dose of furosemide 60 mg IV once. Cath on 1/22 showed severely elevated filling pressures (RA 13, PW 35, PA mean 45), moderate to severe pulmonary hypertension, and normal CI. No evidence of obstructive disease, felt that reduced EF is due to severe MR.  Patient stated she only took furosemide once this past week since last ED visit in 06/2023. At home she says she has off and on lightheadedness/dizziness. Generally says she feels unsteady on her feet and sometimes feels like she is going to fall. Endorses one time in the past year that she did have a fall.   Patient reports improvement in breathing. No LE edema on exam. No new questions or concerns at this time.    Current HF Medications: MRA:  spironolactone 12.5 mg daily SGLT2i: Jardiance 10 mg daily  Prior to admission HF Medications: Diuretic: furosemide 20 mg daily prn edema Other: potassium 10 mEq daily when she taking furosemide  Pertinent Lab Values: Serum creatinine 1.33, BUN 22, Potassium 4.7, Sodium 138, BNP 4411.4, Magnesium 2.2  Vital Signs: Weight: 93 lbs (admission weight: 98 lbs) Blood pressure: 110/70-80s  Heart rate: 90-100 I/O: net -0.4 L yesterday; net -2.8 L since admission  Medication Assistance / Insurance Benefits Check: Does the patient have prescription insurance?  Yes Type of insurance plan: Medicare  Outpatient Pharmacy:  Prior to admission outpatient pharmacy: [facility] Is the patient willing to use [facility] TOC pharmacy at discharge? Yes  Is the patient willing to transition their outpatient pharmacy to utilize a [facility] outpatient pharmacy?   No    Assessment: 1. Acute systolic CHF (LVEF 25%), due to severe MR. NYHA class II symptoms. - Now off IV lasix - Agree with starting spironolactone 12.5 mg daily today, monitor for s/sx of orthostatic hypotension - Continue Jardiance 10 mg daily  - Hold off on initiation of BB and ACE/ARB/ARNI due to orthostatic hypotension - Daily weights, strict I/Os, keep Mg>2 and K>4   Plan: 1) Medication changes recommended at this time: - Agree with changes  2) Patient assistance: - Jardiance/Farxiga $35  3)  Education  - Patient has been educated on current HF medications and potential additions to HF medication regimen - Patient verbalizes understanding that over the next few months, these medication doses may change and more medications may be added to optimize HF regimen - Patient  has been educated on basic disease state pathophysiology and goals of therapy    Sharen Hones, PharmD, BCPS Heart Failure Stewardship Pharmacist Phone [phone]

## 2023-08-02 NOTE — Progress Notes (Addendum)
Patient Name: Cindy Meza Date of Encounter: 08/02/2023 [facility] Cardiologist: Dietrich Pates, MD   Interval Summary  .    Feeling much better this morning. Apologizes for episode yesterday, reports that was not herself. Breathing much improved.   Vital Signs .    Vitals:   08/01/23 2046 08/02/23 0043 08/02/23 0420 08/02/23 0713  BP: 110/74 111/83 108/82 111/78  Pulse:  97 91 82  Resp: 20 19 18 16   Temp: 98.3 F (36.8 C) 98.3 F (36.8 C) 98 F (36.7 C) (!) 97.5 F (36.4 C)  TempSrc: Oral Oral Oral Oral  SpO2: 97% 93% 98% 97%  Weight:   42.5 kg   Height:        Intake/Output Summary (Last 24 hours) at 08/02/2023 0824 Last data filed at 08/02/2023 5784 Gross per 24 hour  Intake 240 ml  Output 600 ml  Net -360 ml      08/02/2023    4:20 AM 08/01/2023    6:29 AM 07/31/2023   11:08 AM  Last 3 Weights  Weight (lbs) 93 lb 11.1 oz -- 91 lb 0.8 oz  Weight (kg) 42.5 kg -- 41.3 kg      Telemetry/ECG    Sinus Rhythm, short run of NSVT - Personally Reviewed  Physical Exam .   GEN: No acute distress.   Neck: No JVD Cardiac: RRR, + systolic murmur LLUB, no rubs, or gallops.  Respiratory: Clear to auscultation bilaterally. GI: Soft, nontender, non-distended  MS: No edema  Assessment & Plan .     88 y.o. female with a hx of fatigue, orthostatic intolerance, mitral regurgitation, asthma, GERD, and aortic atherosclerosis seen on previous CT of abdomen who was seen 07/30/2023 for the evaluation of SOB at the request of Dr. Alinda Money.    Acute HFrEF -- echo 1/16 with LVEF of 25%, global hypokinesis, mildly reduced RV, severe left atrial enlargement, mildly enlarged right atrium, small pericardial effusion, severe MR -- Underwent right/left heart cath 1/22 with mild nonobstructive CAD, severely elevated filling pressures, moderate to severe pulmonary hypertension, LVEDP 40 mmHg -- s/p IV lasix, net -2.8 L -- GDMT: limited options with hx of orthostatic  hypotension. Started on jardiance yesterday with spiro 12.5mg  daily added this morning. Will stop lasix, and hold on additional therapy for now to see how she tolerates meds   Severe MR -- As above left heart cath with mild nonobstructive CAD -- Wedge pressure 35, but no large V waves on cath -- Will need outpatient follow up in the office with structural as she will not likely tolerate much additional GDMT   CKD stage 3a -- Cr 1.3 -- stop lasix  Hypokalemia -- resolved   For questions or updates, please contact [facility] Please consult [email or web address] for contact info under        Signed, Laverda Page, NP   Agree with note by Laverda Page NP-C  Patient admitted with heart failure.  She is diuresed approximately 3 L.  Left and right heart cath suggested nonobstructive disease with hemodynamics consistent with severe MR.  Her serum creatinine is increased from 1.12 up to 1.33.  She is on furosemide 40 mg a day.  Spironolactone was added but has not yet been started.  Her heart rate is in the 80s and her blood pressure is in the 110 range.  She appears euvolemic on exam and feels clinically improved.  She ambulates with a walker.  She has no peripheral edema.  At  this point, we will discontinue furosemide, start low-dose spironolactone.  Will hold off on beta-blocker and ACE/ARB given her renal insufficiency and soft blood pressure.  Will have her see structural heart to explore the possibility of MitraClip should her pharmacologic therapy be insufficient.  Suspect she will be ready for discharge tomorrow.  Runell Gess, M.D., FACP, [facility], [facility] Lagos [facility] [address] [place], [place]  [address]  [phone] 08/02/2023 8:32 AM

## 2023-08-02 NOTE — Progress Notes (Signed)
Mobility Specialist Progress Note:   08/02/23 1045  Mobility  Activity Ambulated with assistance in hallway  Level of Assistance Contact guard assist, steadying assist  Assistive Device None  Distance Ambulated (ft) 300 ft  Activity Response Tolerated well  Mobility Referral Yes  Mobility visit 1 Mobility  Mobility Specialist Start Time (ACUTE ONLY) 1045  Mobility Specialist Stop Time (ACUTE ONLY) 1100  Mobility Specialist Time Calculation (min) (ACUTE ONLY) 15 min   Pt agreeable to mobility session. Required no physical assistance throughout ambulation, only minG for safety. No c/o throughout. Pt back in bed with all needs met.   Addison Lank Mobility Specialist Please contact via SecureChat or  Rehab office at [phone]

## 2023-08-03 ENCOUNTER — Other Ambulatory Visit (HOSPITAL_COMMUNITY): Payer: Self-pay

## 2023-08-03 DIAGNOSIS — I951 Orthostatic hypotension: Secondary | ICD-10-CM

## 2023-08-03 DIAGNOSIS — K219 Gastro-esophageal reflux disease without esophagitis: Secondary | ICD-10-CM | POA: Diagnosis not present

## 2023-08-03 DIAGNOSIS — I5023 Acute on chronic systolic (congestive) heart failure: Secondary | ICD-10-CM | POA: Diagnosis not present

## 2023-08-03 DIAGNOSIS — N1831 Chronic kidney disease, stage 3a: Secondary | ICD-10-CM | POA: Diagnosis not present

## 2023-08-03 DIAGNOSIS — J4520 Mild intermittent asthma, uncomplicated: Secondary | ICD-10-CM | POA: Diagnosis not present

## 2023-08-03 LAB — RENAL FUNCTION PANEL
Albumin: 3 g/dL — ABNORMAL LOW (ref 3.5–5.0)
Anion gap: 7 (ref 5–15)
BUN: 23 mg/dL (ref 8–23)
CO2: 24 mmol/L (ref 22–32)
Calcium: 8.9 mg/dL (ref 8.9–10.3)
Chloride: 106 mmol/L (ref 98–111)
Creatinine, Ser: 1.34 mg/dL — ABNORMAL HIGH (ref 0.44–1.00)
GFR, Estimated: 39 mL/min — ABNORMAL LOW (ref >60)
Glucose, Bld: 93 mg/dL (ref 70–99)
Phosphorus: 4.9 mg/dL — ABNORMAL HIGH (ref 2.5–4.6)
Potassium: 4.4 mmol/L (ref 3.5–5.1)
Sodium: 137 mmol/L (ref 135–145)

## 2023-08-03 MED ORDER — EMPAGLIFLOZIN 10 MG PO TABS
10.0000 mg | ORAL_TABLET | Freq: Every day | ORAL | 0 refills | Status: DC
  Filled 2023-08-03: qty 30, 30d supply, fill #0

## 2023-08-03 MED ORDER — FUROSEMIDE 20 MG PO TABS
20.0000 mg | ORAL_TABLET | Freq: Every day | ORAL | 0 refills | Status: AC | PRN
  Filled 2023-08-03: qty 30, 30d supply, fill #0

## 2023-08-03 MED ORDER — SPIRONOLACTONE 25 MG PO TABS
12.5000 mg | ORAL_TABLET | Freq: Every day | ORAL | 0 refills | Status: DC
  Filled 2023-08-03: qty 15, 30d supply, fill #0

## 2023-08-03 NOTE — TOC Transition Note (Signed)
Transition of Care [facility]) - Discharge Note   Patient Details  Name: Cindy Meza MRN: [id number] Date of Birth: [birth year]  [facility] CM/SW Contact:  Lawerance Sabal, RN Phone Number: 08/03/2023, 12:37 PM   Clinical Narrative:     Notified [facility] that patient will DC to home today   Final next level of care: Home w Home Health Services Barriers to Discharge: Continued Medical Work up   Patient Goals and CMS Choice Patient states their goals for this hospitalization and ongoing recovery are:: return home CMS Medicare.gov Compare Post Acute Care list provided to:: Patient Choice offered to / list presented to : Patient      Discharge Placement                       Discharge Plan and Services Additional resources added to the After Visit Summary for   In-house Referral: NA Discharge Planning Services: CM Consult Post Acute Care Choice: Home Health          DME Arranged: N/A DME Agency: NA       HH Arranged: RN HH Agency: [facility] Date HH Agency Contacted: 08/01/23 Time HH Agency Contacted: 1530 Representative spoke with at [facility] Agency: Tresa Endo  Social Drivers of Health (SDOH) Interventions SDOH Screenings   Food Insecurity: No Food Insecurity (07/30/2023)  Housing: Low Risk  (07/30/2023)  Transportation Needs: No Transportation Needs (07/30/2023)  Utilities: Not At Risk (07/30/2023)  Alcohol Screen: Low Risk  (08/01/2023)  Depression (PHQ2-9): Low Risk  (06/26/2023)  Financial Resource Strain: Low Risk  (08/01/2023)  Physical Activity: Inactive (08/27/2022)  Social Connections: Moderately Integrated (07/30/2023)  Stress: No Stress Concern Present (08/27/2022)  Tobacco Use: Low Risk  (07/30/2023)     Readmission Risk Interventions     No data to display

## 2023-08-03 NOTE — Assessment & Plan Note (Signed)
Continue with fludrocortisone

## 2023-08-03 NOTE — Plan of Care (Signed)
Problem: Education: Goal: Knowledge of General Education information will improve Description: Including pain rating scale, medication(s)/side effects and non-pharmacologic comfort measures Outcome: Progressing   Problem: Clinical Measurements: Goal: Will remain free from infection Outcome: Progressing   Problem: Clinical Measurements: Goal: Diagnostic test results will improve Outcome: Progressing   Problem: Coping: Goal: Level of anxiety will decrease Outcome: Progressing

## 2023-08-03 NOTE — Progress Notes (Signed)
   Patient Name: Cindy Meza Date of Encounter: 08/03/2023 [facility] Cardiologist: Dietrich Pates, MD   Interval Summary  .    Net -1.2 L yesterday, -4 L on admission.  BP 120/83.  Creatinine stable at 1.34.  Denies any shortness of breath.  Reports she went for a walk yesterday and denied any lightheadedness  Vital Signs .    Vitals:   08/03/23 0016 08/03/23 0500 08/03/23 0537 08/03/23 0715  BP: 97/81  120/83   Pulse:    62  Resp: 19  18   Temp: 98.1 F (36.7 C)  98.2 F (36.8 C)   TempSrc: Oral  Oral Oral  SpO2: 93%  95% 96%  Weight:  42.5 kg    Height:        Intake/Output Summary (Last 24 hours) at 08/03/2023 1031 Last data filed at 08/03/2023 0981 Gross per 24 hour  Intake 457 ml  Output 1950 ml  Net -1493 ml      08/03/2023    5:00 AM 08/02/2023    4:20 AM 08/01/2023    6:29 AM  Last 3 Weights  Weight (lbs) 93 lb 11.1 oz 93 lb 11.1 oz --  Weight (kg) 42.5 kg 42.5 kg --      Telemetry/ECG    Sinus Rhythm, short run of NSVT - Personally Reviewed  Physical Exam .   GEN: No acute distress.   Neck: No JVD Cardiac: RRR, + systolic murmur LLUB, no rubs, or gallops.  Respiratory: Clear to auscultation bilaterally. GI: Soft, nontender, non-distended  MS: No edema  Assessment & Plan .     88 y.o. female with a hx of fatigue, orthostatic intolerance, mitral regurgitation, asthma, GERD, and aortic atherosclerosis seen on previous CT of abdomen who was seen 07/30/2023 for the evaluation of SOB at the request of Dr. Alinda Money.    Acute HFrEF -- echo 1/16 with LVEF of 25%, global hypokinesis, mildly reduced RV, severe left atrial enlargement, mildly enlarged right atrium, small pericardial effusion, severe MR -- Underwent right/left heart cath 1/22 with mild nonobstructive CAD, severely elevated filling pressures, moderate to severe pulmonary hypertension, LVEDP 40 mmHg -- s/p IV lasix, net -2.8 L -- GDMT: limited options with hx of orthostatic hypotension.  Started on jardiance with spiro 12.5mg  daily added yesrerday.  Can continue as needed Lasix   Severe MR -- As above left heart cath with mild nonobstructive CAD -- Wedge pressure 35, but no large V waves on cath -- Will need outpatient follow up in the office with structural as she will not likely tolerate much additional GDMT   CKD stage 3a -- Cr 1.3  Hypokalemia -- resolved  [facility] will sign off.   Medication Recommendations: Jardiance 10 mg daily, spironolactone 12.5 mg daily Other recommendations (labs, testing, etc): BMET in 1 week Follow up as an outpatient: Has follow-up scheduled on 08/12/2023    For questions or updates, please contact [facility] Please consult [email or web address] for contact info under        Signed, Little Ishikawa, MD

## 2023-08-03 NOTE — Evaluation (Signed)
Physical Therapy Evaluation and D/C Patient Details Name: Vaness Jelinski MRN: [id number] DOB: 03-01-1936 Today's Date: 08/03/2023  History of Present Illness  88 y.o. female admitted for SOB.  CHF exacerbation.  PMH: GERD, asthma, anemia, OAB, CKD 3A, orthostatic hypotension, low back pain, anxiety, insomnia  Clinical Impression  Pt admitted with above diagnosis. Pt going home today and assessed for needs.  Notified CM that pt would benefit from HHPT as well as should use her RW at home. Pt agrees.  She has RW already.  D/C today therefore no goals set.      If plan is discharge home, recommend the following: Assistance with cooking/housework;Help with stairs or ramp for entrance;Assist for transportation   Can travel by private vehicle        Equipment Recommendations None recommended by PT  Recommendations for Other Services       Functional Status Assessment Patient has had a recent decline in their functional status and demonstrates the ability to make significant improvements in function in a reasonable and predictable amount of time.     Precautions / Restrictions Precautions Precautions: Fall Restrictions Weight Bearing Restrictions Per Provider Order: No      Mobility  Bed Mobility Overal bed mobility: Independent                  Transfers Overall transfer level: Needs assistance Equipment used: None Transfers: Sit to/from Stand Sit to Stand: Contact guard assist           General transfer comment: Pt was able to stand but had a little unsteadiness initially.  Pt did correct balance without help.    Ambulation/Gait Ambulation/Gait assistance: Contact guard assist Gait Distance (Feet): 400 Feet Assistive device: None Gait Pattern/deviations: Step-through pattern, Decreased stride length, Drifts right/left   Gait velocity interpretation: <1.31 ft/sec, indicative of household ambulator   General Gait Details: Pt overall balanced without  challenges but with challenges, did have to self correct balance.  Did not use device however discussed with pt that she would benefit from using RW initially when home and pt agrees.  Stairs            Wheelchair Mobility     Tilt Bed    Modified Rankin (Stroke Patients Only)       Balance                                 Standardized Balance Assessment Standardized Balance Assessment : Dynamic Gait Index   Dynamic Gait Index Level Surface: Normal Change in Gait Speed: Mild Impairment Gait with Horizontal Head Turns: Mild Impairment Gait with Vertical Head Turns: Mild Impairment Gait and Pivot Turn: Moderate Impairment Step Over Obstacle: Moderate Impairment Step Around Obstacles: Normal Steps: Moderate Impairment Total Score: 15       Pertinent Vitals/Pain Pain Assessment Pain Assessment: No/denies pain    Home Living Family/patient expects to be discharged to:: Private residence Living Arrangements: Alone Available Help at Discharge: Family;Available PRN/intermittently (Niece checks prn) Type of Home: House Home Access: Stairs to enter Entrance Stairs-Rails: Left Entrance Stairs-Number of Steps: 4   Home Layout: One level (1 step from living area to kitchen) Home Equipment: Agricultural consultant (2 wheels);Cane - single point;BSC/3in1;Shower seat;Grab bars - tub/shower      Prior Function Prior Level of Function : Independent/Modified Independent;Driving             Mobility Comments: I without AD  ADLs Comments: pt states she used tub and shower more often than walk in shower, BD self, cooks, clean, drives     Extremity/Trunk Assessment   Upper Extremity Assessment Upper Extremity Assessment: Defer to OT evaluation    Lower Extremity Assessment Lower Extremity Assessment: Generalized weakness    Cervical / Trunk Assessment Cervical / Trunk Assessment: Kyphotic  Communication   Communication Communication: No apparent  difficulties  Cognition Arousal: Alert Behavior During Therapy: WFL for tasks assessed/performed Overall Cognitive Status: Within Functional Limits for tasks assessed                                          General Comments General comments (skin integrity, edema, etc.): VSS    Exercises     Assessment/Plan    PT Assessment All further PT needs can be met in the next venue of care  PT Problem List Decreased activity tolerance;Decreased balance;Decreased mobility;Decreased knowledge of use of DME;Decreased safety awareness       PT Treatment Interventions      PT Goals (Current goals can be found in the Care Plan section)  Acute Rehab PT Goals Patient Stated Goal: to go home PT Goal Formulation: All assessment and education complete, DC therapy    Frequency       Co-evaluation               AM-PAC PT "6 Clicks" Mobility  Outcome Measure Help needed turning from your back to your side while in a flat bed without using bedrails?: None Help needed moving from lying on your back to sitting on the side of a flat bed without using bedrails?: A Little Help needed moving to and from a bed to a chair (including a wheelchair)?: A Little Help needed standing up from a chair using your arms (e.g., wheelchair or bedside chair)?: A Little Help needed to walk in hospital room?: A Little Help needed climbing 3-5 steps with a railing? : A Little 6 Click Score: 19    End of Session Equipment Utilized During Treatment: Gait belt Activity Tolerance: Patient tolerated treatment well Patient left: with call bell/phone within reach;in bed;with family/visitor present (sitting EOB) Nurse Communication: Mobility status PT Visit Diagnosis: Unsteadiness on feet (R26.81);Muscle weakness (generalized) (M62.81)    Time: 9811-9147 PT Time Calculation (min) (ACUTE ONLY): 24 min   Charges:   PT Evaluation $PT Eval Moderate Complexity: 1 Mod PT Treatments $Gait Training:  8-22 mins PT General Charges $$ ACUTE PT VISIT: 1 Visit         Diona Peregoy M,PT Acute Rehab Services [phone]   Bevelyn Buckles 08/03/2023, 3:25 PM

## 2023-08-03 NOTE — Discharge Summary (Signed)
Physician Discharge Summary   Patient: Cindy Meza MRN: [id number] DOB: 04-Jan-1936  Admit date:     07/30/2023  Discharge date: 08/03/23  Discharge Physician: York Ram Holy [place]   PCP: Tresa Garter, MD   Recommendations at discharge:    Patient has been placed on spironolactone and SGLT 2 inh, further guideline directed heart failure medical therapy limited due to orthostatic hypotension and acutely reduced GFR.  Continue furosemide as needed in case of volume overload, weight gain 2 to 3 lbs in 24 hrs or 5 lbs in 7 days.  Follow up renal function and electrolytes in 7 days as outpatient.  Follow up with Dr Posey Rea in 7 days Follow up with Cardiology as scheduled.   Discharge Diagnoses: Principal Problem:   Acute on chronic systolic CHF (congestive heart failure) (HCC) Active Problems:   Stage 3a chronic kidney disease (CKD) (HCC)   Asthma   GERD (gastroesophageal reflux disease)   Anxiety   Anemia of chronic disease   Protein-calorie malnutrition, severe   Orthostatic hypotension  Resolved Problems:   * No resolved hospital problems. [facility] Course: [name] was admitted to the hospital with the working diagnosis of heart failure exacerbation.   88 y.o. female with medical history significant of GERD, asthma, anemia, OAB, CKD 3A, orthostatic hypotension, low back pain, anxiety, insomnia presenting with worsening shortness of breath.   Patient has been recently undergoing workup for possible CHF.  She presented to the ED on 12/10 with shortness of breath with an elevated BNP and pleural effusions.  Admission was offered but patient declined and has since followed up with PCP and cardiology and had outpatient echocardiogram performed last couple days but has not seen cardiology since it was performed.   Initially was on Lasix as needed for edema and had been taking it more frequently recently but after initial improvement on this she has had  worsening shortness of breath again.  Also with pedal edema.  Because persistent symptoms EMS was called, initially desaturated on ambulation and was noted to have heart rates in the 90s to 170s that were irregular, no significant tachycardia in the ED.  On her initial physical examination her blood pressure was 133/104, HR 91, RR 23 and 02 saturation 96%, lungs with decreased breath sounds and rales with no wheezing, heart with S1 and S2 present and irregular, with no gallops, positive systolic murmur at the apex, abdomen with no distention and bilateral ankle edema.    Na 138, K 3,6 Cl 107 bicarbonate 21 glucose 121, bun 16 cr 1,18  Mg 2.3  BNP 4,411  High sensitive troponin 18 and 23  Wbc 7,4 hgb 11,6 plt 141   Chest radiograph with cardiomegaly, bilateral hilar vascular congestion, with bilateral pleural effusions, fluid in the right fissure.   EKG 110 bpm, right axis deviation, left bundle branch block, sinus rhythm with PAC, positive LVH, no significant ST segment or T wave changes.   Patient was placed on IV furosemide for diuresis.  Echocardiogram with reduced LV systolic function and mitral regurgitation.   01/22 cardiac catheterization with elevated filling pressures and elevated PA pressure.  01/23 patient with improvement in volume status, she has been declining medications and refused to talk to the heart team this morning.  Improved compliance through the course of the day.  Having ectopy on telemetry  01/124 improved volume status, possible discharge tomorrow.  01/25 improved volume status, plan to discharge home today and follow up as outpatient.  Assessment and Plan: * Acute on chronic systolic CHF (congestive heart failure) (HCC) Echocardiogram with reduced LV systolic function with EF 25%, LV with global hypokinesis, LV cavity mild dilatation, mild LVH, RV systolic function with mild reduction, RVSP 50,8 mmHg. LA with severe dilatation, RA with mild dilatation, small  pericardial effusion, severe mitral valve regurgitation.   01/22 cardiac catheterization  RA 13 RV 53/18  PCWP mean 32 PA 58/35 mean 45  Cardiac output 3,19 and index 2.27 (Fick).   Acute on chronic core pulmonale Pulmonary hypertension.   Patient was placed on IV furosemide for diuresis, negative fluid balance was achieved, - 4,143 ml, with significant improvement in her symptoms.   Continue with SGLT 2 inh and spironolactone  Limited guideline directed medical therapy due to orthostatic hypotension and acute reduction in GFR.   Plan to continue to use furosemide as needed.  Follow up with cardiology as outpatient, to assess mitral regurgitation.   Acute hypoxemic respiratory failure, acute cardiogenic pulmonary edema with bilateral pleural effusions.  Improved oxygenation, today 02 saturation is 97% to 99% on room air.    Stage 3a chronic kidney disease (CKD) (HCC) Hypokalemia. (Basal cr 1,2)   A the time of her discharge her serum cr is 1,34 with K at 4,4 and serum bicarbonate at 24  Na 137 P 4.9  Mag 2,2   On SGLT 2 inh and spironolactone. PRN loop diuretic for now.  Follow up renal function and electrolytes in 7 days as outpatient.   Asthma No acute exacerbation.   GERD (gastroesophageal reflux disease) Continue PPI   Anxiety Continue with as needed lorazepam.   Anemia of chronic disease Stable cell count.   Protein-calorie malnutrition, severe Continue nutritional supplements.  Patient lives alone with apparently no family support, will call his contact information in medical records.  She will need home health services.   Orthostatic hypotension Continue with fludrocortisone          Consultants: cardiology,  Procedures performed: cardiac catheterization   Disposition: Home Diet recommendation:  Discharge Diet Orders (From admission, onward)     Start     Ordered   08/03/23 0000  Diet - low sodium heart healthy        08/03/23 1234            Cardiac diet DISCHARGE MEDICATION: Allergies as of 08/03/2023       Reactions   Fosamax [alendronate Sodium]    Paresthesias, abd pain   Gabapentin    dizziness   Lidocaine Swelling   Of face and lips.  Novocaine does same thing.   Midodrine    Nausea   Risedronate Sodium    Knee pain   Shellfish Allergy Hives   Statins    REACTION: pains        Medication List     STOP taking these medications    potassium chloride 10 MEQ tablet Commonly known as: KLOR-CON   Romosozumab-aqqg 105 MG/1. Sosy injection Commonly known as: EVENITY       TAKE these medications    albuterol 108 (90 Base) MCG/ACT inhaler Commonly known as: Proventil HFA Inhale 1-2 puffs into the lungs every 4 (four) hours as needed for wheezing or shortness of breath.   aspirin 81 MG tablet Take 81 mg by mouth daily.   BENEFIBER DRINK MIX PO Take 1 Capful by mouth daily.   budesonide-formoterol 80-4.5 MCG/ACT inhaler Commonly known as: SYMBICORT Inhale 2 puffs into the lungs 2 (two) times daily.  CALCIUM 500 PO Take 1 tablet by mouth 2 (two) times daily with a meal. With lunch and supper   cholecalciferol 25 MCG (1000 UNIT) tablet Commonly known as: VITAMIN D3 Take 1,000 Units by mouth daily.   [employer] Omega-3 Krill Oil 500 MG Caps Take 500 capsules by mouth daily.   empagliflozin 10 MG Tabs tablet Commonly known as: JARDIANCE Take 1 tablet (10 mg total) by mouth daily. Start taking on: August 04, 2023   estradiol 0.1 MG/GM vaginal cream Commonly known as: ESTRACE Place vaginally.   fludrocortisone 0.1 MG tablet Commonly known as: FLORINEF TAKE 0.5 TABLETS BY MOUTH TWICE DAILY AT 8AM AND 2PM What changed: See the new instructions.   furosemide 20 MG tablet Commonly known as: LASIX Take 1 tablet (20 mg total) by mouth daily as needed for edema (for weight gain 2 to 3 lbs in 24 hrs or 5 lbs in 7 days.). What changed: reasons to take this   LORazepam 0.5 MG  tablet Commonly known as: ATIVAN Take 0.5-1 tablets (0.25-0.5 mg total) by mouth at bedtime as needed for anxiety (insomnia, overactive bladder).   pantoprazole 40 MG tablet Commonly known as: PROTONIX Take 1 tablet (40 mg total) by mouth daily.   polyethylene glycol 17 g packet Commonly known as: MIRALAX / GLYCOLAX Take 17 g by mouth daily.   spironolactone 25 MG tablet Commonly known as: ALDACTONE Take 0.5 tablets (12.5 mg total) by mouth daily. Start taking on: August 04, 2023   Vitamin B-12 1000 MCG Subl Place 1 tablet (1,000 mcg total) under the tongue in the morning.        Follow-up Information      [facility]. Go in 8 day(s).   Specialty: Cardiology Why: Hospital follow up 08/12/2023 @ b12 noon PLEASE bring a current medication list to appointment FREE valet parking, Entrance C, off [employer] information: [address] [place] [address] [phone]        [facility] Follow up.   Specialty: Home Health Services Why: Agency will call you to set up apt times Contact information: [address] [place] [address] [phone]                Discharge Exam: Filed Weights   07/31/23 1108 08/02/23 0420 08/03/23 0500  Weight: 41.3 kg 42.5 kg 42.5 kg   BP 95/77 (BP Location: Right Arm)   Pulse 79   Temp 97.8 F (36.6 C) (Oral)   Resp 18   Ht 5\' 1"  (1.549 m)   Wt 42.5 kg   SpO2 99%   BMI 17.70 kg/m   Patient with improvement in her symptoms, with no chest pain or dyspnea, no PND or orthopnea. Mild ankle edema.   Neurology awake and alert ENT with mild pallor Cardiovascular with S1 and S2 present and regular with extra beats, positive systolic murmur at the apex, no gallops, or rubs No JVD No lower extremity edema, mild ankle edema Respiratory with no rales or wheezing, no rhonchi Abdomen with no distention   Condition at discharge:  stable  The results of significant diagnostics from this hospitalization (including imaging, microbiology, ancillary and laboratory) are listed below for reference.   Imaging Studies: CARDIAC CATHETERIZATION Result Date: 07/31/2023   Mid LAD lesion is 30% stenosed.   Ost LAD to Prox LAD lesion is 40% stenosed. 1.  Short left main coronary artery with moderate ostial LAD stenosis.  No  evidence of obstructive disease overall. 2.  Left ventricular angiography was not performed.  EF was severely reduced by echo. 3.  Right heart catheterization showed severely elevated filling pressures, moderate to severe pulmonary hypertension and normal cardiac index. RA: 13 mmHg RV: 53/18 mmHg PW: 35 mmHg with no large V waves. PA: 58/35 with a mean of 45 mmHg. LVEDP is 40 mmHg. Cardiac output: 3.19 with an index of 2.27. Recommendations: The patient has nonischemic cardiomyopathy could be due to severe mitral regurgitation.  She is severely volume overloaded and spite of IV diuresis.  This will be continued for now. Consider evaluation for mitral valve clip once heart failure is optimized.  However, considering her age and frail status, she might not be a candidate.   DG Chest Port 1 View Result Date: 07/30/2023 CLINICAL DATA:  88 year old female with shortness of breath. Atrial fibrillation. EXAM: PORTABLE CHEST 1 VIEW COMPARISON:  Chest radiographs 07/07/2023 and earlier. FINDINGS: Portable AP semi upright view at 1134 hours. Unresolved veiling bilateral lung base pleural effusions with increased fluid tracking into the right minor fissure now. Underlying cardiomegaly. Stable visible mediastinal contours. Upper lung pulmonary vascularity appears stable to improved, no overt edema. No pneumothorax. No air bronchograms. No acute osseous abnormality identified. Paucity of visible bowel gas. IMPRESSION: 1. Ongoing small to moderate bilateral pleural effusions with increased fluid tracking into the right minor fissure since  last month. 2. Cardiomegaly. No overt edema. No new cardiopulmonary abnormality. Electronically Signed   By: Odessa Fleming M.D.   On: 07/30/2023 11:56   ECHOCARDIOGRAM COMPLETE Result Date: 07/26/2023    ECHOCARDIOGRAM REPORT   Patient Name:   MYLEA ROARTY Date of Exam: 07/25/2023 Medical Rec #:  [id number]       Height:       61.0 in Accession #:    [id number]      Weight:       98.0 lb Date of Birth:  1936-01-31       BSA:          1.395 m Patient Age:    87 years        BP:           122/70 mmHg Patient Gender: F               HR:           102 bpm. Exam Location:  [address] Procedure: 2D Echo, 3D Echo, Cardiac Doppler and Color Doppler Indications:    CHF-Acute Systolic I50.21  History:        Patient has prior history of Echocardiogram examinations, most                 recent 04/20/2016. CHF, Arrythmias:PAC; Signs/Symptoms:Dyspnea.  Sonographer:    Eulah Pont RDCS Referring Phys: [id number] ALEKSEI V [name] IMPRESSIONS  1. Left ventricular ejection fraction, by estimation, is 25%. Left ventricular ejection fraction by 3D volume is 33 %. The left ventricle has severely decreased function. The left ventricle demonstrates global hypokinesis with best preserved function at  the lateral base. The left ventricular internal cavity size was mildly dilated. There is mild left ventricular hypertrophy. Left ventricular diastolic parameters are indeterminate.  2. Right ventricular systolic function is mildly reduced. The right ventricular size is normal. There is moderately elevated pulmonary artery systolic pressure. The estimated right ventricular systolic pressure is 50.8 mmHg.  3. Left atrial size was severely dilated.  4. Right atrial size was mildly dilated.  5. A small  pericardial effusion is present.  6. The mitral valve is grossly normal. Severe mitral valve regurgitation. No evidence of mitral stenosis.  7. The aortic valve is grossly normal. There is mild thickening of the aortic valve. Aortic valve  regurgitation is trivial. No aortic stenosis is present.  8. The inferior vena cava is normal in size with <50% respiratory variability, suggesting right atrial pressure of 8 mmHg. FINDINGS  Left Ventricle: Left ventricular ejection fraction, by estimation, is 25%. Left ventricular ejection fraction by 3D volume is 33 %. The left ventricle has severely decreased function. The left ventricle demonstrates global hypokinesis. The left ventricular internal cavity size was mildly dilated. There is mild left ventricular hypertrophy. Left ventricular diastolic parameters are indeterminate. Right Ventricle: The right ventricular size is normal. No increase in right ventricular wall thickness. Right ventricular systolic function is mildly reduced. There is moderately elevated pulmonary artery systolic pressure. The tricuspid regurgitant velocity is 3.27 m/s, and with an assumed right atrial pressure of 8 mmHg, the estimated right ventricular systolic pressure is 50.8 mmHg. Left Atrium: Left atrial size was severely dilated. Right Atrium: Right atrial size was mildly dilated. Pericardium: A small pericardial effusion is present. Mitral Valve: The mitral valve is grossly normal. Severe mitral valve regurgitation. No evidence of mitral valve stenosis. Tricuspid Valve: The tricuspid valve is normal in structure. Tricuspid valve regurgitation is mild . No evidence of tricuspid stenosis. Aortic Valve: The aortic valve is grossly normal. There is mild thickening of the aortic valve. Aortic valve regurgitation is trivial. No aortic stenosis is present. Pulmonic Valve: The pulmonic valve was normal in structure. Pulmonic valve regurgitation is trivial. No evidence of pulmonic stenosis. Aorta: The aortic root is normal in size and structure. Ascending aorta measurements are within normal limits for age when indexed to body surface area. Venous: The inferior vena cava is normal in size with less than 50% respiratory variability,  suggesting right atrial pressure of 8 mmHg. IAS/Shunts: No atrial level shunt detected by color flow Doppler.  LEFT VENTRICLE PLAX 2D LVIDd:         5.64 cm LVIDs:         4.96 cm LV PW:         1.06 cm         3D Volume EF LV IVS:        0.92 cm         LV 3D EF:    Left LVOT diam:     2.10 cm                      ventricul LV SV:         41                           ar LV SV Index:   30                           ejection LVOT Area:     3.46 cm                     fraction                                             by 3D  volume is                                             33 %.                                 3D Volume EF:                                3D EF:        33 %                                LV EDV:       161 ml                                LV ESV:       108 ml                                LV SV:        53 ml RIGHT VENTRICLE RV S prime:     9.73 cm/s TAPSE (M-mode): 2.1 cm LEFT ATRIUM             Index        RIGHT ATRIUM           Index LA diam:        3.10 cm 2.22 cm/m   RA Area:     15.10 cm LA Vol (A2C):   74.1 ml 53.13 ml/m  RA Volume:   37.90 ml  27.17 ml/m LA Vol (A4C):   75.7 ml 54.27 ml/m LA Biplane Vol: 78.9 ml 56.57 ml/m  AORTIC VALVE LVOT Vmax:   69.67 cm/s LVOT Vmean:  43.833 cm/s LVOT VTI:    0.119 m  AORTA Ao Root diam: 3.90 cm Ao Asc diam:  3.40 cm MR Peak grad:   86.9 mmHg    TRICUSPID VALVE MR Mean grad:   56.0 mmHg    TR Peak grad:   42.8 mmHg MR Vmax:        466.00 cm/s  TR Vmax:        327.00 cm/s MR Vmean:       356.0 cm/s MR PISA:        1.57 cm     SHUNTS MR PISA Radius: 0.50 cm      Systemic VTI:  0.12 m                              Systemic Diam: 2.10 cm Weston Brass MD Electronically signed by Weston Brass MD Signature Date/Time: 07/26/2023/10:35:15 AM    Final     Microbiology: Results for orders placed or performed in visit on 12/07/22  Urine Culture     Status: None   Collection Time: 12/07/22  2:35 PM    Specimen: Urine  Result Value Ref Range Status   Source: URINE  Final   Status: FINAL  Final   Result: No Growth  Final    Labs:  CBC: Recent Labs  Lab 07/30/23 1200 07/31/23 0652 07/31/23 1227 07/31/23 1234 08/02/23 0248  WBC 7.4 7.5  --   --  5.7  NEUTROABS 6.1  --   --   --   --   HGB 11.6* 12.3 11.9* 11.9* 10.7*  HCT 36.3 38.6 35.0* 35.0* 33.2*  MCV 97.1 97.5  --   --  96.2  PLT 141* 182  --   --  172   Basic Metabolic Panel: Recent Labs  Lab 07/30/23 1200 07/31/23 0652 07/31/23 1227 07/31/23 1234 08/01/23 1229 08/02/23 0248 08/03/23 0242  NA 138 138 140 140 140 136 137  K 3.6 3.4* 3.2* 3.2* 3.7 4.7 4.4  CL 107 102  --   --  105 104 106  CO2 21* 22  --   --  26 25 24   GLUCOSE 121* 113*  --   --  90 105* 93  BUN 16 20  --   --  19 22 23   CREATININE 1.18* 1.26*  --   --  1.23* 1.33* 1.34*  CALCIUM 9.1 8.9  --   --  8.9 9.0 8.9  MG 2.3  --   --   --  2.2  --   --   PHOS  --   --   --   --   --   --  4.9*   Liver Function Tests: Recent Labs  Lab 07/31/23 0652 08/03/23 0242  AST 53*  --   ALT 52*  --   ALKPHOS 40  --   BILITOT 0.9  --   PROT 6.8  --   ALBUMIN 3.8 3.0*   CBG: No results for input(s): "GLUCAP" in the last 168 hours.  Discharge time spent: greater than 30 minutes.  Signed: Coralie Keens, MD [facility] 08/03/2023

## 2023-08-05 ENCOUNTER — Telehealth: Payer: Self-pay | Admitting: *Deleted

## 2023-08-05 ENCOUNTER — Telehealth: Payer: Self-pay

## 2023-08-05 NOTE — Transitions of Care (Post Inpatient/ED Visit) (Addendum)
08/05/2023  Name: Cindy Meza MRN: [id number] DOB: 1936-05-16  Today's TOC FU Call Status: Today's TOC FU Call Status:: Successful TOC FU Call Completed TOC FU Call Complete Date: 08/05/23 Patient's Name and Date of Birth confirmed.  Transition Care Management Follow-up Telephone Call Date of Discharge: 08/03/23 Discharge Facility: [facility]) Type of Discharge: Inpatient Admission Primary Inpatient Discharge Diagnosis:: Acute on chronic CHF exacerbation How have you been since you were released from the hospital?: Better ("I am doing okay.  I am going to start weighinhg myself at home today, thanks for the explanation about why it is important.  I will listen for the home health team to call me back- they said they would call probably today) Any questions or concerns?: No  Items Reviewed: Did you receive and understand the discharge instructions provided?: Yes (thoroughly reviewed with patient who verbalizes good understanding of same) Medications obtained,verified, and reconciled?: Yes (Medications Reviewed) (Full medication reconciliation/ review completed; no concerns or discrepancies identified; confirmed patient obtained/ is taking all newly Rx'd medications as instructed; self-manages medications and denies questions/ concerns around medications today) Any new allergies since your discharge?: No Dietary orders reviewed?: Yes Type of Diet Ordered:: Heart Healthy/ low salt Do you have support at home?: Yes People in Home: alone Name of Support/Comfort Primary Source: Reports resides alone/ independent in self-care activities; supportive local niece Manus Gunning assists as/ if needed/ indicated- patient provided verbal consent for me to speak with [name] "any time" during our [facility] initial phone call today  Medications Reviewed Today: Medications Reviewed Today     Reviewed by Michaela Corner, RN (Registered Nurse) on 08/05/23 at 1159  Med List Status: <None>   Medication  Order Taking? Sig Documenting Provider Last Dose Status Informant  albuterol (PROVENTIL HFA) 108 (90 Base) MCG/ACT inhaler [id number] Yes Inhale 1-2 puffs into the lungs every 4 (four) hours as needed for wheezing or shortness of breath. [name], [name], MD Taking Active Self, Pharmacy Records  aspirin 81 MG tablet [id number] Yes Take 81 mg by mouth daily. [provider] Taking Active Self, Pharmacy Records  budesonide-formoterol [facility]) 80-4.5 MCG/ACT inhaler [id number] Yes Inhale 2 puffs into the lungs 2 (two) times daily. [name], [name], MD Taking Active Self, Pharmacy Records  Calcium-Magnesium-Vitamin D (CALCIUM 500 PO) [id number] Yes Take 1 tablet by mouth 2 (two) times daily with a meal. With lunch and supper  Taking Active Self, Pharmacy Records  cholecalciferol (VITAMIN D3) 25 MCG (1000 UNIT) tablet [id number] Yes Take 1,000 Units by mouth daily. [provider] Taking Active Self, Pharmacy Records  [employer] OMEGA-3 KRILL OIL 500 MG CAPS [id number] Yes Take 500 capsules by mouth daily. [provider] Taking Active Self, Pharmacy Records  Cyanocobalamin (VITAMIN B-12) 1000 MCG SUBL [id number] Yes Place 1 tablet (1,000 mcg total) under the tongue in the morning. Arnaldo Natal, NP Taking Active Self, Pharmacy Records  empagliflozin (JARDIANCE) 10 MG TABS tablet [id number] Yes Take 1 tablet (10 mg total) by mouth daily. Arrien, York Ram, MD Taking Active   estradiol (ESTRACE) 0.1 MG/GM vaginal cream [id number] No Place vaginally.  Patient not taking: Reported on 08/05/2023   [provider] Not Taking Active Self, Pharmacy Records           Med Note Sherlon Handing, [facility] D   Tue Jul 30, 2023  1:54 PM) unknown  fludrocortisone (FLORINEF) 0.1 MG tablet [id number] Yes TAKE 0.5 TABLETS BY MOUTH TWICE DAILY AT 8AM AND 2PM  Patient taking differently: Take  0.05 mg by mouth 2 (two) times daily.   Pricilla Riffle, MD Taking Active Self, Pharmacy Records   furosemide (LASIX) 20 MG tablet [id number] Yes Take 1 tablet (20 mg total) by mouth daily as needed for edema (for weight gain 2 to 3 lbs in 24 hrs or 5 lbs in 7 days.). Arrien, York Ram, MD Taking Active   LORazepam (ATIVAN) 0.5 MG tablet [id number] Yes Take 0.5-1 tablets (0.25-0.5 mg total) by mouth at bedtime as needed for anxiety (insomnia, overactive bladder). [name], [name], MD Taking Active Self, Pharmacy Records           Med Note Sherlon Handing, [facility] D   Tue Jul 30, 2023  1:55 PM) unknown  pantoprazole (PROTONIX) 40 MG tablet [id number] Yes Take 1 tablet (40 mg total) by mouth daily. [name], [name], MD Taking Active Self, Pharmacy Records  polyethylene glycol (MIRALAX / GLYCOLAX) 17 g packet [id number] Yes Take 17 g by mouth daily. [provider] Taking Active Self, Pharmacy Records  Romosozumab-aqqg (EVENITY) 105 MG/1. injection 210 mg [id number]   Shamleffer, Konrad Dolores, MD  Active   spironolactone (ALDACTONE) 25 MG tablet [id number] Yes Take 0.5 tablets (12.5 mg total) by mouth daily. Arrien, York Ram, MD Taking Active   Wheat Dextrin (BENEFIBER DRINK MIX PO) [id number] Yes Take 1 Capful by mouth daily. [provider] Taking Active Self, Pharmacy Records           Home Care and Equipment/Supplies: Were Home Health Services Ordered?: Yes Name of Home Health Agency:: [facility]:  RN:  [phone] Has Agency set up a time to come to your home?: No (verified has heard from agency; awaiting call-back to schedule initial home visit) EMR reviewed for Home Health Orders: Orders present/patient has not received call (refer to CM for follow-up) (confirmed home health services active: they have contacted patient and she is expecting call back today to schedule start of services) Any new equipment or medical supplies ordered?: No  Functional Questionnaire: Do you need assistance with bathing/showering or dressing?: No (niece supervises-  assists as indicated/ needed) Do you need assistance with meal preparation?: No (niece supervises- assists as indicated/ needed) Do you need assistance with eating?: No Do you have difficulty maintaining continence: No Do you need assistance with getting out of bed/getting out of a chair/moving?: No Do you have difficulty managing or taking your medications?: No  Follow up appointments reviewed: PCP Follow-up appointment confirmed?: No (care coordination outreach in real-time with scheduling care guide to contact patient to schedule hospital follow up PCP appointment- care guide SA confirmed she will outreach patient promptly to schedule) MD Provider Line [phone] Given: No Specialist Hospital Follow-up appointment confirmed?: Yes (verified well-established with current PCP) Date of Specialist follow-up appointment?: 08/12/23 (verified this is recommended time frame for follow up per hospital discharging provider notes) Follow-Up Specialty Provider:: cardiology provider Do you need transportation to your follow-up appointment?: No Do you understand care options if your condition(s) worsen?: Yes-patient verbalized understanding  SDOH Interventions Today    Flowsheet Row Most Recent Value  SDOH Interventions   Food Insecurity Interventions Intervention Not Indicated  Housing Interventions Intervention Not Indicated  Transportation Interventions Intervention Not Indicated  [niece provides transportation]  Utilities Interventions Intervention Not Indicated       Goals Addressed             This Visit's Progress    TOC 30-day Program Care Plan   On track    Current Barriers:  Medication  management patient lives alone; niece assists with medication management: uses weekly pill planner box; extensive medication list; requires extra time to understand medication management Home Health services currently active with [facility] [phone]: patient has heard from agency; have  not yet made initial home visit to establish care/ start of services; patient has contact information for agency Fragile state of health; resides alone; good support through niece [name]: patient provided verbal consent for me to speak with [name] "at any time" during [facility] 30-day program outreach visits Hospitalization 1/22-25/2025 for Acute on chronic CHF- discharged to home/ self-care New to daily weight monitoring/ recording: will require ongoing reinforcement/ support Uses cane at baseline for mobility management; occasionally uses walker: no DME needs per patient at time of initial TOC call on 08/05/23  RNCM Clinical Goal(s):  Patient will work with the Care Management team over the next 30 days to address Transition of Care Barriers: Medication Management Provider appointments Home Health services verbalize basic understanding of  CHF disease process and self health management plan as evidenced by patient reporting during weekly TOC 30-day program outreach phone calls take all medications exactly as prescribed and will call provider for medication related questions as evidenced by patient reporting during weekly TOC 30day program outreach phone calls attend all scheduled medical appointments: cardiology provider: 08/12/23 as evidenced by review of EHR and with patient/ provider (as indicated) during weekly TOC 30-day program outreach phone calls work with [facility] home health RN to support patient at home post-hospital discharge on 08/03/23 as evidenced by review of same with patient/ collaboration as indicated with home health agency during weekly [facility] 30-day program outreach phone calls not experience hospital admission as evidenced by review of EMR. Hospital Admissions in last 6 months = 1  through collaboration with RN Care manager, provider, and care team.   Interventions: Evaluation of current treatment plan related to  self management and patient's adherence to plan as established by  provider  Transitions of Care:  New goal. 08/05/23 Durable Medical Equipment (DME) needs assessed with patient/caregiver Doctor Visits  - discussed the importance of doctor visits Post discharge activity limitations prescribed by provider reviewed Full medication review with updating medication list in EHR per patient report - patient required extensive time for medication review: verbalizes good understanding of medications/ self manages- nice assists as indicated; will require ongoing support/ reinforcement of medication management at home Role of home health services with importance of participation/ ongoing engagement Confirmed uses assistive devices on regular basis, at baseline -- cane; walker as indicated/ as needed; encouraged her use of assistive devices for mobility; fall prevention discussed Provided extensive education/ guidance re: rationale for daily weight monitoring at home along with weight gain guidelines/ action plan for weight gain; importance of taking diuretic as prescribed  Confirmed patient is NOT currently obtaining daily weights at home: provided education / rationale for daily weight monitoring at home as earliest indicator of fluid retention, along with weight gain guidelines/ action plan for weight gain; importance of taking diuretic as prescribed Advised patient to take all recorded weights from home monitoring to upcoming cardiology provider office visit for provider review Provided education/ reinforcement re: signs and symptoms other than weight gain that indicate fluid retention Reinforced/ provided education around basics of following heart healthy low salt diet Care coordination outreach in real-time with scheduling care guide to schedule hospital follow up PCP appointment- received confirmation from care guide that she will call patient promptly to schedule Reviewed upcoming provider office visits: 08/09/23- endocrinology  provider; 08/12/23: cardiology provider:  confirmed patient is aware of all and has plans to attend as scheduled; encouraged patient to listen for call from scheduling care guide to schedule HFU OV with PCP Successfully enrolled into 30-day TOC program  Patient Goals/Self-Care Activities: Participate in Transition of Care Program/Attend TOC scheduled calls Take all medications as prescribed Attend all scheduled provider appointments Call provider office for new concerns or questions  Weigh yourself every day to stay on top of early fluid retention: write down your weights every day so you remember what it is from day to day-- take your weights from home to the upcoming appointment with your cardiologist (heart doctor) on 08/12/23 Please go over all of your medications and medication question/ concerns with your care providers (doctor) Eat a heart healthy and low salt diet Continue pacing activity to avoid episodes of shortness of breath Use assistive devices (cane/ walker) as needed to prevent falls   Follow Up Plan:  Telephone follow up appointment with care management team member scheduled for:  Tuesday, August 13, 2023 at 1:00 pm           Time spent:  105 minutes  Caryl Pina, Charity fundraiser, BSN, Media planner  Transitions of Care  [facility] - [facility] [phone]: direct office

## 2023-08-05 NOTE — Telephone Encounter (Signed)
Copied from CRM [id number]. Topic: Clinical - Home Health Verbal Orders >> Aug 05, 2023  9:45 AM Almira Coaster wrote: Caller/Agency: [facility] Callback Number: [phone] Service Requested: Skilled Nursing Frequency:new start of care date 08/07/2023 Any new concerns about the patient? No

## 2023-08-05 NOTE — Patient Instructions (Signed)
Visit Information  Thank you for taking time to visit with me today. Please don't hesitate to contact me if I can be of assistance to you before our next scheduled telephone appointment.  Our next appointment is by telephone on Tuesday, August 13, 2023 at 1:00 pm  Patient Goals/Self-Care Activities: Participate in Transition of Care Program/Attend TOC scheduled calls Take all medications as prescribed Attend all scheduled provider appointments Call provider office for new concerns or questions  Weigh yourself every day to stay on top of early fluid retention: write down your weights every day so you remember what it is from day to day-- take your weights from home to the upcoming appointment with your cardiologist (heart doctor) on 08/12/23 Please go over all of your medications and medication question/ concerns with your care providers (doctor) Eat a heart healthy and low salt diet Continue pacing activity to avoid episodes of shortness of breath Use assistive devices (cane/ walker) as needed to prevent falls   Following is a copy of your care plan:   Goals Addressed             This Visit's Progress    TOC 30-day Program Care Plan   On track    Current Barriers:  Medication management patient lives alone; niece assists with medication management: uses weekly pill planner box; extensive medication list; requires extra time to understand medication management Home Health services currently active with [facility] [phone]: patient has heard from agency; have not yet made initial home visit to establish care/ start of services; patient has contact information for agency Fragile state of health; resides alone; good support through niece [name]: patient provided verbal consent for me to speak with [name] "at any time" during [facility] 30-day program outreach visits Hospitalization 1/22-25/2025 for Acute on chronic CHF- discharged to home/ self-care New to daily weight monitoring/  recording: will require ongoing reinforcement/ support Uses cane at baseline for mobility management; occasionally uses walker: no DME needs per patient at time of initial TOC call on 08/05/23  RNCM Clinical Goal(s):  Patient will work with the Care Management team over the next 30 days to address Transition of Care Barriers: Medication Management Provider appointments Home Health services verbalize basic understanding of  CHF disease process and self health management plan as evidenced by patient reporting during weekly TOC 30-day program outreach phone calls take all medications exactly as prescribed and will call provider for medication related questions as evidenced by patient reporting during weekly TOC 30day program outreach phone calls attend all scheduled medical appointments: cardiology provider: 08/12/23 as evidenced by review of EHR and with patient/ provider (as indicated) during weekly TOC 30-day program outreach phone calls work with [facility] home health RN to support patient at home post-hospital discharge on 08/03/23 as evidenced by review of same with patient/ collaboration as indicated with home health agency during weekly [facility] 30-day program outreach phone calls not experience hospital admission as evidenced by review of EMR. Hospital Admissions in last 6 months = 1  through collaboration with RN Care manager, provider, and care team.   Interventions: Evaluation of current treatment plan related to  self management and patient's adherence to plan as established by provider  Transitions of Care:  New goal. 08/05/23 Durable Medical Equipment (DME) needs assessed with patient/caregiver Doctor Visits  - discussed the importance of doctor visits Post discharge activity limitations prescribed by provider reviewed Full medication review with updating medication list in EHR per patient report - patient required extensive time  for medication review: verbalizes good understanding of  medications/ self manages- nice assists as indicated; will require ongoing support/ reinforcement of medication management at home Role of home health services with importance of participation/ ongoing engagement Confirmed uses assistive devices on regular basis, at baseline -- cane; walker as indicated/ as needed; encouraged her use of assistive devices for mobility; fall prevention discussed Provided extensive education/ guidance re: rationale for daily weight monitoring at home along with weight gain guidelines/ action plan for weight gain; importance of taking diuretic as prescribed  Confirmed patient is NOT currently obtaining daily weights at home: provided education / rationale for daily weight monitoring at home as earliest indicator of fluid retention, along with weight gain guidelines/ action plan for weight gain; importance of taking diuretic as prescribed Advised patient to take all recorded weights from home monitoring to upcoming cardiology provider office visit for provider review Provided education/ reinforcement re: signs and symptoms other than weight gain that indicate fluid retention Reinforced/ provided education around basics of following heart healthy low salt diet Care coordination outreach in real-time with scheduling care guide to schedule hospital follow up PCP appointment- received confirmation from care guide that she will call patient promptly to schedule Reviewed upcoming provider office visits: 08/09/23- endocrinology provider; 08/12/23: cardiology provider: confirmed patient is aware of all and has plans to attend as scheduled; encouraged patient to listen for call from scheduling care guide to schedule HFU OV with PCP Successfully enrolled into 30-day TOC program  Patient Goals/Self-Care Activities: Participate in Transition of Care Program/Attend [facility] scheduled calls Take all medications as prescribed Attend all scheduled provider appointments Call provider office  for new concerns or questions  Weigh yourself every day to stay on top of early fluid retention: write down your weights every day so you remember what it is from day to day-- take your weights from home to the upcoming appointment with your cardiologist (heart doctor) on 08/12/23 Please go over all of your medications and medication question/ concerns with your care providers (doctor) Eat a heart healthy and low salt diet Continue pacing activity to avoid episodes of shortness of breath Use assistive devices (cane/ walker) as needed to prevent falls   Follow Up Plan:  Telephone follow up appointment with care management team member scheduled for:  Tuesday, August 13, 2023 at 1:00 pm         The patient verbalized understanding of instructions, educational materials, and care plan provided today and DECLINED offer to receive copy of patient instructions, educational materials, and care plan.   Telephone follow up appointment with care management team member scheduled for:  Tuesday, August 13, 2023 at 1:00 pm  Please call the care guide team at [phone] if you need to cancel or reschedule your appointment.   If you are experiencing a Mental Health or Behavioral Health Crisis or need someone to talk to, please  call the Suicide and Crisis Lifeline: 988 call the [place] National Suicide Prevention Lifeline: 610-617-0500 or TTY: (959)732-4241 TTY (267)024-1943) to talk to a trained counselor call 1-800-273-TALK (toll free, 24 hour hotline) go to [facility] [address], [place] [phone]) call the [facility] Crisis Line: 218-652-6401 call 911   Caryl Pina, RN, BSN, CCRN Alumnus RN Care Manager  Transitions of Care  [facility]BC[facility] - Population Health  [place] [phone]: direct office

## 2023-08-05 NOTE — Telephone Encounter (Signed)
Okay. Thank you.

## 2023-08-06 ENCOUNTER — Ambulatory Visit: Payer: Medicare Other

## 2023-08-06 NOTE — Telephone Encounter (Addendum)
Evenity Injection Schedule Inj #1 - 04/23/23 Inj #2 - 05/29/23 Inj #3 - 07/05/23  Re-run benefits for 2025 - VOB initiated  Inj #4 - scheduled 08/06/23 Inj #5  Inj #6  Inj #7  Inj #8  Inj #9  Inj #10  Inj #11  Inj #12    Prior Auth APPROVED PA# [id number] Valid: 01/06/23-01/06/24

## 2023-08-06 NOTE — Telephone Encounter (Signed)
Spoke with Pam and was able to give her the verbal OK for Skilled Nursing.

## 2023-08-09 ENCOUNTER — Telehealth: Payer: Self-pay | Admitting: Internal Medicine

## 2023-08-09 ENCOUNTER — Ambulatory Visit: Payer: Medicare Other | Admitting: Internal Medicine

## 2023-08-09 ENCOUNTER — Ambulatory Visit (INDEPENDENT_AMBULATORY_CARE_PROVIDER_SITE_OTHER): Payer: Medicare Other | Admitting: Internal Medicine

## 2023-08-09 ENCOUNTER — Encounter: Payer: Self-pay | Admitting: Internal Medicine

## 2023-08-09 VITALS — BP 104/68 | HR 70 | Ht 61.0 in | Wt 92.4 lb

## 2023-08-09 DIAGNOSIS — M810 Age-related osteoporosis without current pathological fracture: Secondary | ICD-10-CM | POA: Diagnosis not present

## 2023-08-09 DIAGNOSIS — N1831 Chronic kidney disease, stage 3a: Secondary | ICD-10-CM | POA: Diagnosis not present

## 2023-08-09 NOTE — Patient Instructions (Signed)
-   Continue Calcium 600 mg tablets twice daily  - Continue Vitamin D 1000 iu daily

## 2023-08-09 NOTE — Telephone Encounter (Signed)
LVM with verbal ok for [facility]  Service Requested: Physical Therapy and Occupational  Therapy Frequency: Nursing once a week for 6 week, and PT and OT to evaluate.

## 2023-08-09 NOTE — Progress Notes (Signed)
Name: Cindy Meza  MRN/ DOB: [id number], 01/02/1936    Age/ Sex: 88 y.o., female     PCP: Tresa Garter, MD   Reason for Endocrinology Evaluation: Osteoporosis     Initial Endocrinology Clinic Visit: 03/10/2020    PATIENT IDENTIFIER: Cindy Meza is a 88 y.o., female with a past medical history of osteoporosis, asthma, and CAD. She has followed with [facility] since 03/10/2020 for consultative assistance with management of her osteoporosis.   HISTORICAL SUMMARY:   Pt was diagnosed with osteoporosis: 2018   Menarche at age : 88 Menopausal at age : in her 79's  Fracture Hx: Right hip fracture  Hx of HRT: yes  FH of osteoporosis or hip fracture: no Prior Hx of anti-estrogenic therapy : no  Prior Hx of anti-resorptive therapy : Intolerant to Fosamax - heel pain, many years ago. Restarted ~ 2020 but stopped in 2021 due to abdominal pain.  She has been on risedronate with knee pain  First Prolia injection 05/05/2020 until 11/30/2022  Due to worsening BMD , we started Evenity 04/23/2023 and received November and December doses.  Evenity was put on hold after her admission for CHF and worsening GFR   SUBJECTIVE:    Today (08/09/2023):  Ms. Derk is here for osteoporosis .  She is accompanied by her niece Manus Gunning   Last Evenity  injection 07/05/2023  She was admitted 07/2023 for CHF exacerbation SOB is stable  NO chest pain  She does not see nephrology  Denies recent falls  Denies recent bone fractures  Denies vomiting but has occasional nausea  ON miralax, benefiber and prunes for constipation    Calcium 600  tabs BID Vitamin D3 1000 iu daily       HISTORY:  Past Medical History:  Past Medical History:  Diagnosis Date  . Abdominal pain 03/30/2015   Dr Leone Payor     /16, 1/21 ?fosamax related  S/p gyn eval, abd CT (-)  11/16 50% better  Better off Fosamax, off Miralax   12/22 Recurrent abd pain - pt saw Dr Leone Payor; CT  w/constipation - using Miralax and Benefiber - better now    . Allergy   . Anemia   . Asthma    Dr. Sherene Sires  . Blood transfusion without reported diagnosis   . Breast cyst    Left  . Cataract    Dr. Nile Riggs  . Cerumen impaction 04/06/2016   9/17 L, 10/19 B    . Chest pressure 07/22/2012   1/14, 8/17  Recurrent - ?asthma related (11/17)  Advair MDI - use qd, not prn  We added Singulair  Cardiac w/up was (-) - --- Dr Tenny Craw    . Closed right hip fracture (HCC) 06/12/2016   Dr Ave Filter  R hip fx - intermedullary nail 06/13/17 s/p   On Prolia, calcium, Vit D       . Complication of anesthesia    ' I HAD HALLUCINATIONS WHEN THEY DID MY HIP "  . GERD (gastroesophageal reflux disease)   . Heart murmur   . Hemorrhoids   . History of colonic polyps Leone Payor   hx of adenomas (small) 1998  . Hot flash, menopausal 02/15/2012   Ongoing   D/c estrogens due to breast abnormalities 2013    . Nausea 02/12/2019   8/20 d/c Midodrin, Florinef  Occasional.   Nausea after taking Pantoprazole - take w/food    . OA (osteoarthritis)   . Orthostatic hypotension   .  Osteopenia   . PAC (premature atrial contraction)   . Upper respiratory infection 06/13/2023   Zpac if worse    . Vitamin D deficiency    Past Surgical History:  Past Surgical History:  Procedure Laterality Date  . ABDOMINAL HYSTERECTOMY     complete  . APPENDECTOMY    . BREAST CYST EXCISION    . CATARACT EXTRACTION, BILATERAL     04/23/18 left eye, 04/02/18 right eye  . COLONOSCOPY  Multiple   Adenomatous colon polyps  . ESOPHAGOGASTRODUODENOSCOPY  2010  . INTRAMEDULLARY (IM) NAIL INTERTROCHANTERIC Right 06/13/2016   Procedure: INTRAMEDULLARY (IM) NAIL INTERTROCHANTRIC;  Surgeon: Jones Broom, MD;  Location: [facility] OR;  Service: Orthopedics;  Laterality: Right;  . RIGHT/LEFT HEART CATH AND CORONARY ANGIOGRAPHY N/A 07/31/2023   Procedure: RIGHT/LEFT HEART CATH AND CORONARY ANGIOGRAPHY;  Surgeon: Iran Ouch, MD;  Location: [facility] INVASIVE  CV LAB;  Service: Cardiovascular;  Laterality: N/A;  . TONSILLECTOMY     Social History:  reports that she has never smoked. She has never used smokeless tobacco. She reports that she does not currently use alcohol. She reports that she does not use drugs. Family History:  Family History  Problem Relation Age of Onset  . Diabetes Mother 101       Deceased  . Hypertension Other   . Colon cancer Neg Hx   . Pancreatic cancer Neg Hx   . Stomach cancer Neg Hx   . Esophageal cancer Neg Hx   . Rectal cancer Neg Hx      HOME MEDICATIONS: Allergies as of 08/09/2023       Reactions   Fosamax [alendronate Sodium]    Paresthesias, abd pain   Gabapentin    dizziness   Lidocaine Swelling   Of face and lips.  Novocaine does same thing.   Midodrine    Nausea   Risedronate Sodium    Knee pain   Shellfish Allergy Hives   Statins    REACTION: pains        Medication List        Accurate as of August 09, 2023  3:03 PM. If you have any questions, ask your nurse or doctor.          albuterol 108 (90 Base) MCG/ACT inhaler Commonly known as: Proventil HFA Inhale 1-2 puffs into the lungs every 4 (four) hours as needed for wheezing or shortness of breath.   aspirin 81 MG tablet Take 81 mg by mouth daily.   BENEFIBER DRINK MIX PO Take 1 Capful by mouth daily.   budesonide-formoterol 80-4.5 MCG/ACT inhaler Commonly known as: SYMBICORT Inhale 2 puffs into the lungs 2 (two) times daily.   CALCIUM 500 PO Take 1 tablet by mouth 2 (two) times daily with a meal. With lunch and supper   cholecalciferol 25 MCG (1000 UNIT) tablet Commonly known as: VITAMIN D3 Take 1,000 Units by mouth daily.   [employer]S Omega-3 Krill Oil 500 MG Caps Take 500 capsules by mouth daily.   estradiol 0.1 MG/GM vaginal cream Commonly known as: ESTRACE Place vaginally.   fludrocortisone 0.1 MG tablet Commonly known as: FLORINEF TAKE 0.5 TABLETS BY MOUTH TWICE DAILY AT 8AM AND 2PM What changed: See the new  instructions.   furosemide 20 MG tablet Commonly known as: LASIX Take 1 tablet (20 mg total) by mouth daily as needed for edema (for weight gain 2 to 3 lbs in 24 hrs or 5 lbs in 7 days.).   Jardiance 10 MG Tabs tablet  Generic drug: empagliflozin Take 1 tablet (10 mg total) by mouth daily.   LORazepam 0.5 MG tablet Commonly known as: ATIVAN Take 0.5-1 tablets (0.25-0.5 mg total) by mouth at bedtime as needed for anxiety (insomnia, overactive bladder).   pantoprazole 40 MG tablet Commonly known as: PROTONIX Take 1 tablet (40 mg total) by mouth daily.   polyethylene glycol 17 g packet Commonly known as: MIRALAX / GLYCOLAX Take 17 g by mouth daily.   spironolactone 25 MG tablet Commonly known as: ALDACTONE Take 0.5 tablets (12.5 mg total) by mouth daily.   Vitamin B-12 1000 MCG Subl Place 1 tablet (1,000 mcg total) under the tongue in the morning.          OBJECTIVE:   PHYSICAL EXAM: VS: BP 104/68 (BP Location: Left Arm, Patient Position: Sitting, Cuff Size: Small)   Pulse 70   Ht 5\' 1"  (1.549 m)   Wt 92 lb 6.4 oz (41.9 kg)   SpO2 95%   BMI 17.46 kg/m    EXAM: General: Pt appears well and is in NAD  Lungs: Clear with good BS bilat   Heart: Auscultation: RRR.  Extremities:  BL LE: No pretibial edema normal ROM and strength.  Mental Status: Judgment, insight: Intact Orientation: Oriented to time, place, and person Mood and affect: No depression, anxiety, or agitation     DATA REVIEWED:   Latest Reference Range & Units 08/03/23 02:42  Sodium 135 - 145 mmol/L 137  Potassium 3.5 - 5.1 mmol/L 4.4  Chloride 98 - 111 mmol/L 106  CO2 22 - 32 mmol/L 24  Glucose 70 - 99 mg/dL 93  BUN 8 - 23 mg/dL 23  Creatinine 1.61 - 0.96 mg/dL 0.45 (H)  Calcium 8.9 - 10.3 mg/dL 8.9  Anion gap 5 - 15  7  Phosphorus 2.5 - 4.6 mg/dL 4.9 (H)  Albumin 3.5 - 5.0 g/dL 3.0 (L)  GFR, Estimated >60 mL/min 39 (L)  (H): Data is abnormally high (L): Data is abnormally low    DXA  11/08/2022  11/08/2022 Change 2023  AP  -3.0 Down 10%  LFN -2.3   Left TH -2.0 Down 2%  1/3rd radius -4.3 Down 6%   Old records , labs and images have been reviewed.     ASSESSMENT / PLAN / RECOMMENDATIONS:   1. Osteoporosis :  -Patient with reported intolerance to alendronate and risedronate -She was on prolia 04/2020 until 11/2022, we switched to Evenity due to worsening BMD, and she received 3 doses 04/2023,05/2023 and 06/2023.  But this will be put on hold after her admission for CHF and worsening GFR - Emphasized the importance of optimizing calcium and vitamin D intake.  -Labs 12/2022 showed normal vitamin D, TFTs, PTH of 33, normal phosphorus is 3.5, normal magnesium 2.3 -Lab work shows normal vitamin D, TFTs, serum protein electrophoresis, PTH , urine protein electrophoresisPTH of 33.  And normal serum and urine protein electrophoresis. -In light of her acute cardiac issues, I have recommended holding off on the Evenity and rechecking in 6 months   Medications  Continue calcium 500 mg twice daily Continue Vitamin D 1000 iu daily   2. CKD III :  -Referral to nephrology has been placed as her GFR has been trending down with a low- normal  PTH     Follow-up in 6 months    I spent 27 minutes preparing to see the patient by review of recent labs, imaging and procedures, obtaining and reviewing separately obtained history, communicating with the  patient/family or caregiver, ordering medications, tests or procedures, and documenting clinical information in the EHR including the differential Dx, treatment, and any further evaluation and other management   Signed electronically by: Lyndle Herrlich, MD  [facility]  [facility] [address] [place] [place], [place] [address] Phone: [phone] FAX: [phone]      CC: Tresa Garter, MD [address] [place] [address] Phone: [phone]  Fax: [phone]   Return  to Endocrinology clinic as below: Future Appointments  Date Time Provider Department Center  08/12/2023 12:00 PM [facility] None  08/13/2023  1:00 PM Michaela Corner, RN [facility] None  08/14/2023  3:20 PM [name], [name], MD [facility] None  08/28/2023  3:00 PM [facility]-ANNUAL WELLNESS VISIT 2 [facility] None  09/24/2023  1:40 PM [name], [name], MD [facility] None

## 2023-08-09 NOTE — Telephone Encounter (Signed)
 Okay. Thank you.

## 2023-08-09 NOTE — Progress Notes (Signed)
HEART & VASCULAR TRANSITION OF CARE CONSULT NOTE    Referring Physician: Dr. [name] Primary Care: [name], [name], MD  Primary Cardiologist: Dr. Tenny Craw  Chief Complaint: "valve disorder follow up"  HPI: Referred to clinic by Dr. [name] for heart failure consultation.   Cindy Meza is a 88 y.o. female with chronic systolic HF, orthostatic intolerance, severe MR, asthma, GERD and fatigue. Has been intolerant of multiple medications and diuresis and GDMT has been limited by orthostatic hypotension.  Admitted 1/25 with heart failure exacerbation. Echo showed EF 25% and severe MR. Underwent L/RHC which showed severely elevated filling pressures, moderate to severe pulmonary hypertension and normal cardiac index. NiCM: Mid LAD 30% stenosed, ost LAD to prox LAD 40 % stenosed. NiCM. Diuresed ~4L with improvement in symptoms. GDMT again limited with orthostatic hypotension.   Today she returns for HF TOC visit with her niece. Overall feeling fair. Denies palpitations, CP, dizziness, edema, or PND/Orthopnea. Sporadically feels SOB. Appetite ok, watches what she eats. No fever or chills. Weight at home 89-91 pounds. Taking all medications. Denies ETOH, tobacco or drug use. Lives alone, does errands for herself. Has not needed any PRN lasix.  Cardiac Testing  [facility] 1/25: RA 13, PA 58/35 mean 45 mmHg, severely elevated filling pressures, moderate to severe pulmonary hypertension and normal cardiac index. Mid LAD 30% stenosed, ost LAD to prox LAD 40 % stenosed. NiCM Echo 1/25 EF 25%, LV with GHK, RV mildly reduced, LA severely dilated, RA mildly dilated, small pericardial effusion present Echo 2019 showed EF 50 to 55%, mild diastolic dysfunction, moderate MR  Myoview in 2019 showed no ischemia    Past Medical History:  Diagnosis Date   Abdominal pain 03/30/2015   Dr Leone Payor     /16, 1/21 ?fosamax related  S/p gyn eval, abd CT (-)  11/16 50% better  Better off Fosamax, off Miralax    12/22 Recurrent abd pain - pt saw Dr Leone Payor; CT w/constipation - using Miralax and Benefiber - better now     Allergy    Anemia    Asthma    Dr. Sherene Sires   Blood transfusion without reported diagnosis    Breast cyst    Left   Cataract    Dr. Nile Riggs   Cerumen impaction 04/06/2016   9/17 L, 10/19 B     Chest pressure 07/22/2012   1/14, 8/17  Recurrent - ?asthma related (11/17)  Advair MDI - use qd, not prn  We added Singulair  Cardiac w/up was (-) - --- Dr Tenny Craw     Closed right hip fracture (HCC) 06/12/2016   Dr Ave Filter  R hip fx - intermedullary nail 06/13/17 s/p   On Prolia, calcium, Vit D        Complication of anesthesia    ' I HAD HALLUCINATIONS WHEN THEY DID MY HIP "   GERD (gastroesophageal reflux disease)    Heart murmur    Hemorrhoids    History of colonic polyps [facility]   hx of adenomas (small) 1998   Hot flash, menopausal 02/15/2012   Ongoing   D/c estrogens due to breast abnormalities 2013     Nausea 02/12/2019   8/20 d/c Midodrin, Florinef  Occasional.   Nausea after taking Pantoprazole - take w/food     OA (osteoarthritis)    Orthostatic hypotension    Osteopenia    PAC (premature atrial contraction)    Upper respiratory infection 06/13/2023   Zpac if worse  Vitamin D deficiency     Current Outpatient Medications  Medication Sig Dispense Refill   albuterol (PROVENTIL HFA) 108 (90 Base) MCG/ACT inhaler Inhale 1-2 puffs into the lungs every 4 (four) hours as needed for wheezing or shortness of breath. 8.5 g 5   aspirin 81 MG tablet Take 81 mg by mouth daily.     budesonide-formoterol (SYMBICORT) 80-4.5 MCG/ACT inhaler Inhale 2 puffs into the lungs 2 (two) times daily. 1 each 11   Calcium-Magnesium-Vitamin D (CALCIUM 500 PO) Take 1 tablet by mouth 2 (two) times daily with a meal. With lunch and supper     cholecalciferol (VITAMIN D3) 25 MCG (1000 UNIT) tablet Take 1,000 Units by mouth daily.     CVS OMEGA-3 KRILL OIL 500 MG CAPS Take 500 capsules by mouth daily.      Cyanocobalamin (VITAMIN B-12) 1000 MCG SUBL Place 1 tablet (1,000 mcg total) under the tongue in the morning. 100 tablet 3   empagliflozin (JARDIANCE) 10 MG TABS tablet Take 1 tablet (10 mg total) by mouth daily. 30 tablet 0   estradiol (ESTRACE) 0.1 MG/GM vaginal cream Place vaginally.     fludrocortisone (FLORINEF) 0.1 MG tablet TAKE 0.5 TABLETS BY MOUTH TWICE DAILY AT 8AM AND 2PM (Patient taking differently: Take 0.05 mg by mouth 2 (two) times daily.) 90 tablet 3   furosemide (LASIX) 20 MG tablet Take 1 tablet (20 mg total) by mouth daily as needed for edema (for weight gain 2 to 3 lbs in 24 hrs or 5 lbs in 7 days.). 30 tablet 0   LORazepam (ATIVAN) 0.5 MG tablet Take 0.5-1 tablets (0.25-0.5 mg total) by mouth at bedtime as needed for anxiety (insomnia, overactive bladder). 30 tablet 2   pantoprazole (PROTONIX) 40 MG tablet Take 1 tablet (40 mg total) by mouth daily. 30 tablet 11   polyethylene glycol (MIRALAX / GLYCOLAX) 17 g packet Take 17 g by mouth daily.     spironolactone (ALDACTONE) 25 MG tablet Take 0.5 tablets (12.5 mg total) by mouth daily. 15 tablet 0   Wheat Dextrin (BENEFIBER DRINK MIX PO) Take 1 Capful by mouth daily.     Current Facility-Administered Medications  Medication Dose Route Frequency Provider Last Rate Last Admin   Romosozumab-aqqg (EVENITY) 105 MG/1. injection 210 mg  210 mg Subcutaneous Once Shamleffer, Konrad Dolores, MD        Allergies  Allergen Reactions   Fosamax [Alendronate Sodium]     Paresthesias, abd pain   Gabapentin     dizziness   Lidocaine Swelling    Of face and lips.  Novocaine does same thing.   Midodrine     Nausea    Risedronate Sodium     Knee pain   Shellfish Allergy Hives   Statins     REACTION: pains      Social History   Socioeconomic History   Marital status: Widowed    Spouse name: Not on file   Number of children: Not on file   Years of education: Not on file   Highest education level: Not on file   Occupational History   Occupation: retired  Tobacco Use   Smoking status: Never   Smokeless tobacco: Never  Vaping Use   Vaping status: Never Used  Substance and Sexual Activity   Alcohol use: Not Currently    Alcohol/week: 0.0 standard drinks of alcohol   Drug use: No   Sexual activity: Not Currently  Other Topics Concern   Not on file  Social  History Narrative   She lives alone in a [place] home.  No children.   Retired Child psychotherapist.   Highest level of education:  Masters degree      Right Handed    Lives in one story home / lives alone    Social Drivers of Health   Financial Resource Strain: Low Risk  (08/01/2023)   Overall Financial Resource Strain (CARDIA)    Difficulty of Paying Living Expenses: Not hard at all  Food Insecurity: No Food Insecurity (08/05/2023)   Hunger Vital Sign    Worried About Running Out of Food in the Last Year: Never true    Ran Out of Food in the Last Year: Never true  Transportation Needs: No Transportation Needs (08/05/2023)   PRAPARE - Administrator, Civil Service (Medical): No    Lack of Transportation (Non-Medical): No  Physical Activity: Inactive (08/27/2022)   Exercise Vital Sign    Days of Exercise per Week: 0 days    Minutes of Exercise per Session: 0 min  Stress: No Stress Concern Present (08/27/2022)   [employer] of Occupational Health - Occupational Stress Questionnaire    Feeling of Stress : Not at all  Social Connections: Moderately Integrated (07/30/2023)   Social Connection and Isolation Panel [NHANES]    Frequency of Communication with Friends and Family: More than three times a week    Frequency of Social Gatherings with Friends and Family: Twice a week    Attends Religious Services: More than 4 times per year    Active Member of [employer] or Organizations: Yes    Attends Banker Meetings: More than 4 times per year    Marital Status: Widowed  Intimate Partner Violence: Not At Risk (08/05/2023)    Humiliation, Afraid, Rape, and Kick questionnaire    Fear of Current or Ex-Partner: No    Emotionally Abused: No    Physically Abused: No    Sexually Abused: No      Family History  Problem Relation Age of Onset   Diabetes Mother 71       Deceased   Hypertension Other    Colon cancer Neg Hx    Pancreatic cancer Neg Hx    Stomach cancer Neg Hx    Esophageal cancer Neg Hx    Rectal cancer Neg Hx     Vitals:   08/12/23 1200  BP: 109/79  Pulse: 91  SpO2: 96%  Weight: 42.4 kg (93 lb 6.4 oz)  Height: 5\' 1"  (1.549 m)    PHYSICAL EXAM: General:  elderly appearing.  No respiratory difficulty HEENT: normal Neck: supple. JVD ~7 cm. Carotids 2+ bilat; no bruits. No lymphadenopathy or thyromegaly appreciated. Cor: PMI nondisplaced. Regular rate & rhythm. No rubs, gallops or murmurs. Lungs: clear Abdomen: soft, nontender, nondistended. No hepatosplenomegaly. No bruits or masses. Good bowel sounds. Extremities: no cyanosis, clubbing, rash, edema  Neuro: alert & oriented x 3, cranial nerves grossly intact. moves all 4 extremities w/o difficulty. Affect pleasant.   ECG: NSR 97 bpm with PACs (Personally reviewed)    ASSESSMENT & PLAN: Chronic systolic heart failure - L/RHC 1/25: RA 13, PA 58/35 mean 45 mmHg, severely elevated filling pressures, moderate to severe pulmonary hypertension and normal cardiac index. Mid LAD 30% stenosed, ost LAD to prox LAD 40 % stenosed. NiCM - Echo 1/25 EF 25%, LV with GHK, RV mildly reduced, LA severely dilated, RA mildly dilated, small pericardial effusion present -cMRI 2008: EF 50%, GHK, no evidence of  hyper enhancement or scar tissue NYHA II, appears euvolemic GDMT: Has been significantly limited by orthostatic hypotension or intolerances Diuretic- Continue lasix 20 mg daily PRN, very clear on indications of when to take BB- can consider adding at next visit, would to QHS. Wanted to hold off for today Ace/ARB/ARNI- limited with soft BP MRA-  Continue spiro 12.5 mg daily SGLT2i- Continue Jardiance 10 mg daily - GDMT limited with orthostatic hypotension. Not a candidate for advanced therapies d/t frailty and advanced age. Would not want open heart surgery.  - would recommence repeat echo in ~3 months, may be a candidate for ICD if EF remains low  Orthostatic hypotension - intolerant of midodrine - BP stable today - plans to get compression socks  Severe MR - Severe MR on 1/25 echo - Consider referral to structural heart team for mitral clip eval. May be limited by age and frailty.  - minimal MR on auscultation   Referred to HFSW (PCP, Medications, Transportation, ETOH Abuse, Drug Abuse, Insurance, Financial ): No Refer to Pharmacy: No Refer to Home Health: No Refer to Advanced Heart Failure Clinic: Yes or no, suspect she can go back to [facility] Refer to General Cardiology: Yes   Follow up in 1 month, suspect she can go back to general cardiology after that

## 2023-08-09 NOTE — Telephone Encounter (Signed)
Copied from CRM [id number]. Topic: Clinical - Home Health Verbal Orders >> Aug 09, 2023 11:57 AM Florestine Avers wrote: Caller/Agency: Ardith Dark Callback Number: [phone], it is secure to leave a voicemail if needed. Service Requested: Physical Therapy and Occupational  Therapy Frequency: Nursing once a week for 6 week, and PT and OT to evaluate.  Any new concerns about the patient? Yes

## 2023-08-10 NOTE — Telephone Encounter (Signed)
Last Prolia inj 07/04/24 Next Prolia inj due 02/03/24

## 2023-08-10 NOTE — Telephone Encounter (Signed)
Currently receiving treatment with [facility]

## 2023-08-12 ENCOUNTER — Encounter (HOSPITAL_COMMUNITY): Payer: Self-pay

## 2023-08-12 ENCOUNTER — Ambulatory Visit (HOSPITAL_COMMUNITY)
Admit: 2023-08-12 | Discharge: 2023-08-12 | Disposition: A | Discharge status: Home / Self Care | Payer: Medicare Other | Attending: Internal Medicine

## 2023-08-12 VITALS — BP 109/79 | HR 91 | Ht 61.0 in | Wt 93.4 lb

## 2023-08-12 DIAGNOSIS — J45909 Unspecified asthma, uncomplicated: Secondary | ICD-10-CM | POA: Diagnosis not present

## 2023-08-12 DIAGNOSIS — Z7984 Long term (current) use of oral hypoglycemic drugs: Secondary | ICD-10-CM | POA: Insufficient documentation

## 2023-08-12 DIAGNOSIS — I951 Orthostatic hypotension: Secondary | ICD-10-CM | POA: Diagnosis not present

## 2023-08-12 DIAGNOSIS — I272 Pulmonary hypertension, unspecified: Secondary | ICD-10-CM | POA: Insufficient documentation

## 2023-08-12 DIAGNOSIS — I340 Nonrheumatic mitral (valve) insufficiency: Secondary | ICD-10-CM

## 2023-08-12 DIAGNOSIS — I5023 Acute on chronic systolic (congestive) heart failure: Secondary | ICD-10-CM | POA: Diagnosis present

## 2023-08-12 DIAGNOSIS — Z79899 Other long term (current) drug therapy: Secondary | ICD-10-CM | POA: Insufficient documentation

## 2023-08-12 DIAGNOSIS — K219 Gastro-esophageal reflux disease without esophagitis: Secondary | ICD-10-CM | POA: Diagnosis not present

## 2023-08-12 DIAGNOSIS — I428 Other cardiomyopathies: Secondary | ICD-10-CM | POA: Diagnosis not present

## 2023-08-12 DIAGNOSIS — R5383 Other fatigue: Secondary | ICD-10-CM | POA: Insufficient documentation

## 2023-08-12 DIAGNOSIS — I5022 Chronic systolic (congestive) heart failure: Secondary | ICD-10-CM | POA: Diagnosis not present

## 2023-08-12 DIAGNOSIS — I3139 Other pericardial effusion (noninflammatory): Secondary | ICD-10-CM | POA: Insufficient documentation

## 2023-08-12 MED ORDER — SPIRONOLACTONE 25 MG PO TABS: 12.5000 mg | ORAL_TABLET | Freq: Every day | ORAL | 2 refills | Status: DC

## 2023-08-12 NOTE — Patient Instructions (Addendum)
No Labs done today.  No medication changes were made. Please continue all current medications as prescribed.  Your physician recommends that you schedule a follow-up appointment in: 1 month  If you have any questions or concerns before your next appointment please send Cindy Meza a message through [place] or call our office at [phone].    TO LEAVE A MESSAGE FOR THE NURSE SELECT OPTION 2, PLEASE LEAVE A MESSAGE INCLUDING: YOUR NAME DATE OF BIRTH CALL BACK NUMBER REASON FOR CALL**this is important as we prioritize the call backs  YOU WILL RECEIVE A CALL BACK THE SAME DAY AS LONG AS YOU CALL BEFORE 4:00 PM   Do the following things EVERYDAY: Weigh yourself in the morning before breakfast. Write it down and keep it in a log. Take your medicines as prescribed Eat low salt foods--Limit salt (sodium) to 2000 mg per day.  Stay as active as you can everyday Limit all fluids for the day to less than 2 liters   At the [facility], you and your health needs are our priority. As part of our continuing mission to provide you with exceptional heart care, we have created designated Provider Care Teams. These Care Teams include your primary Cardiologist (physician) and Advanced Practice Providers (APPs- Physician Assistants and Nurse Practitioners) who all work together to provide you with the care you need, when you need it.   You may see any of the following providers on your designated Care Team at your next follow up: Dr Arvilla Meres Dr Marca Ancona Dr. Marcos Eke, NP Robbie Lis, [place] [facility], [place] Brynda Peon, NP Karle Plumber, PharmD   Please be sure to bring in all your medications bottles to every appointment.    Thank you for choosing [facility]

## 2023-08-13 ENCOUNTER — Other Ambulatory Visit: Payer: Self-pay | Admitting: *Deleted

## 2023-08-13 NOTE — Patient Outreach (Signed)
 Care Management  Transitions of Care Program Transitions of Care Post-discharge week 2/ day # 8   08/13/2023 Name: Cindy Meza MRN: [id number] DOB: 07-27-1935  Subjective: Cindy Meza is a 88 y.o. year old female who is a primary care patient of [name], [name], MD. The Care Management team Engaged with patient Engaged with patient by telephone to assess and address transitions of care needs.   Consent to Services:  Patient was given information about care management services, agreed to services, and gave verbal consent to participate.  Enrolled in TOC 30-day program:  08/05/23  Assessment: I am doing just fine, not having any problems.  The PT is coming out to work with me this afternoon.  All of my doctors have said things are looking good since the hospital visit.  I have been weighing myself every morning just like you told me to  Denies clinical concerns and sounds to be in no distress throughout [facility] 30-day program outreach call today          SDOH Interventions    Flowsheet Row Telephone from 08/05/2023 in [place] [facility] DEPARTMENT ED to Hosp-Admission (Discharged) from 07/30/2023 in [facility] 3E HF PCU Clinical Support from 08/27/2022 in [facility] at [facility] Clinical Support from 08/08/2020 in [facility] at [place]  SDOH Interventions      Food Insecurity Interventions Intervention Not Indicated -- Intervention Not Indicated Intervention Not Indicated  Housing Interventions Intervention Not Indicated -- Intervention Not Indicated Intervention Not Indicated  Transportation Interventions Intervention Not Indicated  [niece provides transportation] -- Intervention Not Indicated Intervention Not Indicated  Utilities Interventions Intervention Not Indicated -- Intervention Not Indicated --  Alcohol Usage Interventions -- Intervention Not Indicated (Score <7) Intervention Not Indicated (Score  <7) --  Financial Strain Interventions -- Intervention Not Indicated Intervention Not Indicated Intervention Not Indicated  Physical Activity Interventions -- -- Intervention Not Indicated, Patient Refused Intervention Not Indicated, Patient Refused  Stress Interventions -- -- Intervention Not Indicated Intervention Not Indicated  Social Connections Interventions -- -- Intervention Not Indicated Intervention Not Indicated        Goals Addressed             This Visit's Progress    TOC 30-day Program Care Plan   On track    Current Barriers:  Medication management patient lives alone; niece assists with medication management: uses weekly pill planner box; extensive medication list; requires extra time to understand medication management Home Health services currently active with [facility] [phone]: patient has heard from agency; have not yet made initial home visit to establish care/ start of services; patient has contact information for agency Fragile state of health; resides alone; good support through niece [name]: patient provided verbal consent for me to speak with [name] at any time during [facility] 30-day program outreach visits Hospitalization 1/22-25/2025 for Acute on chronic CHF- discharged to home/ self-care New to daily weight monitoring/ recording: will require ongoing reinforcement/ support Uses cane at baseline for mobility management; occasionally uses walker: no DME needs per patient at time of initial TOC call on 08/05/23  RNCM Clinical Goal(s):  Patient will work with the Care Management team over the next 30 days to address Transition of Care Barriers: Medication Management Provider appointments Home Health services verbalize basic understanding of  CHF disease process and self health management plan as evidenced by patient reporting during weekly TOC 30-day program outreach phone calls take all medications exactly  as prescribed and will call provider for medication  related questions as evidenced by patient reporting during weekly TOC 30day program outreach phone calls attend all scheduled medical appointments: cardiology provider: 08/12/23 as evidenced by review of EHR and with patient/ provider (as indicated) during weekly TOC 30-day program outreach phone calls work with [facility] home health RN to support patient at home post-hospital discharge on 08/03/23 as evidenced by review of same with patient/ collaboration as indicated with home health agency during weekly [facility] 30-day program outreach phone calls not experience hospital admission as evidenced by review of EMR. Hospital Admissions in last 6 months = 1  through collaboration with RN Care manager, provider, and care team.   Interventions: Evaluation of current treatment plan related to  self management and patient's adherence to plan as established by provider  Transitions of Care:  Goal on track:  Yes. 08/13/23 Durable Medical Equipment (DME) needs assessed with patient/caregiver Doctor Visits  - discussed the importance of doctor visits Post discharge activity limitations prescribed by provider reviewed Reviewed recent provider office visits with patient- 08/09/23- endocrinology provider (for osteoporosis) and 08/12/23 CHF clinic-- patient verbalizes good understanding of same; denies questions/ concerns post-recent provider office visits; she confirms no medication changes post- office visit Provided reinforcement around previously provided education/ guidance re: rationale for daily weight monitoring at home along with weight gain guidelines/ action plan for weight gain; importance of taking diuretic as prescribed  Reviewed recent weights at home with patient: she reports consistent weights between 89-91 lbs and denies overnight weight gain > 3 lbs/ weekly weight gain > 5 lbs Provided education/ reinforcement re: signs and symptoms other than weight gain that indicate fluid retention Confirmed home  health services now active- endorses active participation/ encouraged her ongoing engagement Confirmed uses assistive devices on regular basis, at baseline -- cane mainly; walker as indicated/ as needed; encouraged her use of assistive devices for mobility; fall prevention reinforced Reviewed upcoming provider office visits: 08/14/23- PCP appointment: confirmed patient is aware of all and has plans to attend as scheduled  Patient Goals/Self-Care Activities: Participate in Transition of Care Program/Attend [facility] scheduled calls Take all medications as prescribed Attend all scheduled provider appointments Call provider office for new concerns or questions  Weigh yourself every day to stay on top of early fluid retention: write down your weights every day so you remember what it is from day to day Please go over all of your medications and medication question/ concerns with your care providers (doctor) Eat a heart healthy and low salt diet Continue pacing activity to avoid episodes of shortness of breath Use assistive devices (cane/ walker) as needed to prevent falls  Follow Up Plan:  Telephone follow up appointment with care management team member scheduled for:  Wednesday, August 21, 2023 at 1:00 pm          Plan: Telephone follow up appointment with care management team member scheduled for:   Wednesday, August 21, 2023 at 1:00 pm  Total time spent from review to signing of note/ including any care coordination interventions:  58 minutes  Chrisopher Pustejovsky Mckinney Timmi Devora, RN, BSN, Media Planner  Transitions of Care  [facility] - [facility] [phone]: direct office

## 2023-08-13 NOTE — Patient Instructions (Signed)
 Visit Information  Thank you for taking time to visit with me today. Please don't hesitate to contact me if I can be of assistance to you before our next scheduled telephone appointment.  Our next appointment is by telephone on Wednesday 08/21/23 at 1:00 pm  Please call the care guide team at [phone] if you need to cancel or reschedule your appointment.   Following are the goals we discussed today:  Patient Goals/Self-Care Activities: Participate in Transition of Care Program/Attend TOC scheduled calls Take all medications as prescribed Attend all scheduled provider appointments Call provider office for new concerns or questions  Weigh yourself every day to stay on top of early fluid retention: write down your weights every day so you remember what it is from day to day Please go over all of your medications and medication question/ concerns with your care providers (doctor) Eat a heart healthy and low salt diet Continue pacing activity to avoid episodes of shortness of breath Use assistive devices (cane/ walker) as needed to prevent falls  If you are experiencing a Mental Health or Behavioral Health Crisis or need someone to talk to, please  call the Suicide and Crisis Lifeline: 988 call the [place]  National Suicide Prevention Lifeline: 361-702-0554 or TTY: 3010210602 TTY 848-319-4398) to talk to a trained counselor call 1-800-273-TALK (toll free, 24 hour hotline) go to [facility] [address] [place] [phone]) call the [facility] Line: [phone] call 911   The patient verbalized understanding of instructions, educational materials, and care plan provided today and DECLINED offer to receive copy of patient instructions, educational materials, and care plan.   [name] Jessalynn Mccowan, RN, BSN, Media Planner  Transitions of Care  [facility] - [facility] [phone]: direct office

## 2023-08-13 NOTE — Telephone Encounter (Signed)
 Per visit note 08/09/23:  ASSESSMENT / PLAN / RECOMMENDATIONS:    1. Osteoporosis :   -Patient with reported intolerance to alendronate and risedronate -She was on prolia  04/2020 until 11/2022, we switched to Evenity  due to worsening BMD, and she received 3 doses 04/2023,05/2023 and 06/2023.  But this will be put on hold after her admission for CHF and worsening GFR - Emphasized the importance of optimizing calcium  and vitamin D  intake.  -Labs 12/2022 showed normal vitamin D , TFTs, PTH of 33, normal phosphorus is 3.5, normal magnesium 2.3 -Lab work shows normal vitamin D , TFTs, serum protein electrophoresis, PTH , urine protein electrophoresisPTH of 33.  And normal serum and urine protein electrophoresis. -In light of her acute cardiac issues, I have recommended holding off on the Evenity  and rechecking in 6 months     Medications  Continue calcium  500 mg twice daily Continue Vitamin D  1000 iu daily

## 2023-08-14 ENCOUNTER — Ambulatory Visit: Payer: Medicare Other | Admitting: Internal Medicine

## 2023-08-14 ENCOUNTER — Encounter: Payer: Self-pay | Admitting: Internal Medicine

## 2023-08-14 VITALS — BP 90/68 | HR 100 | Temp 97.4°F | Ht 61.0 in | Wt 92.8 lb

## 2023-08-14 DIAGNOSIS — I2510 Atherosclerotic heart disease of native coronary artery without angina pectoris: Secondary | ICD-10-CM | POA: Diagnosis not present

## 2023-08-14 DIAGNOSIS — I5023 Acute on chronic systolic (congestive) heart failure: Secondary | ICD-10-CM | POA: Diagnosis not present

## 2023-08-14 DIAGNOSIS — N1831 Chronic kidney disease, stage 3a: Secondary | ICD-10-CM | POA: Diagnosis not present

## 2023-08-14 DIAGNOSIS — M810 Age-related osteoporosis without current pathological fracture: Secondary | ICD-10-CM

## 2023-08-14 MED ORDER — SPIRONOLACTONE 25 MG PO TABS: 12.5000 mg | ORAL_TABLET | Freq: Every day | ORAL | 3 refills | Status: DC

## 2023-08-14 MED ORDER — EMPAGLIFLOZIN 10 MG PO TABS: 10.0000 mg | ORAL_TABLET | Freq: Every day | ORAL | 3 refills | Status: DC

## 2023-08-14 NOTE — Progress Notes (Signed)
 Subjective:  Patient ID: Cindy Meza, female    DOB: Apr 05, 1936  Age: 88 y.o. MRN: [id number]  CC: Hosp. f/u   HPI Cindy Meza presents for CHF, osteoporosis, CRI - post-hosp f/u She is here w/her niece.... Per hx:   Admit date:     07/30/2023  Discharge date: 08/03/23  Discharge Physician: Elidia Sieving Arrien    PCP: Garald [name], MD    Recommendations at discharge:     Patient has been placed on spironolactone  and SGLT 2 inh, further guideline directed heart failure medical therapy limited due to orthostatic hypotension and acutely reduced GFR.  Continue furosemide  as needed in case of volume overload, weight gain 2 to 3 lbs in 24 hrs or 5 lbs in 7 days.  Follow up renal function and electrolytes in 7 days as outpatient.  Follow up with Dr Garald in 7 days Follow up with Cardiology as scheduled.    Discharge Diagnoses: Principal Problem:   Acute on chronic systolic CHF (congestive heart failure) (HCC) Active Problems:   Stage 3a chronic kidney disease (CKD) (HCC)   Asthma   GERD (gastroesophageal reflux disease)   Anxiety   Anemia of chronic disease   Protein-calorie malnutrition, severe   Orthostatic hypotension   Resolved Problems:   * No resolved hospital problems. [facility] Course: Mrs. Cindy Meza was admitted to the hospital with the working diagnosis of heart failure exacerbation.    88 y.o. female with medical history significant of GERD, asthma, anemia, OAB, CKD 3A, orthostatic hypotension, low back pain, anxiety, insomnia presenting with worsening shortness of breath.   Patient has been recently undergoing workup for possible CHF.  She presented to the ED on 12/10 with shortness of breath with an elevated BNP and pleural effusions.  Admission was offered but patient declined and has since followed up with PCP and cardiology and had outpatient echocardiogram performed last couple days but has not seen cardiology since it was  performed.   Initially was on Lasix  as needed for edema and had been taking it more frequently recently but after initial improvement on this she has had worsening shortness of breath again.  Also with pedal edema.   Because persistent symptoms EMS was called, initially desaturated on ambulation and was noted to have heart rates in the 90s to 170s that were irregular, no significant tachycardia in the ED.   On her initial physical examination her blood pressure was 133/104, HR 91, RR 23 and 02 saturation 96%, lungs with decreased breath sounds and rales with no wheezing, heart with S1 and S2 present and irregular, with no gallops, positive systolic murmur at the apex, abdomen with no distention and bilateral ankle edema.     Na 138, K 3,6 Cl 107 bicarbonate 21 glucose 121, bun 16 cr 1,18  Mg 2.3  BNP 4,411  High sensitive troponin 18 and 23  Wbc 7,4 hgb 11,6 plt 141    Chest radiograph with cardiomegaly, bilateral hilar vascular congestion, with bilateral pleural effusions, fluid in the right fissure.    EKG 110 bpm, right axis deviation, left bundle branch block, sinus rhythm with PAC, positive LVH, no significant ST segment or T wave changes.    Patient was placed on IV furosemide  for diuresis.  Echocardiogram with reduced LV systolic function and mitral regurgitation.    01/22 cardiac catheterization with elevated filling pressures and elevated PA pressure.  01/23 patient with improvement in volume status, she has been declining medications and  refused to talk to the heart team this morning.  Improved compliance through the course of the day.  Having ectopy on telemetry  01/124 improved volume status, possible discharge tomorrow.  01/25 improved volume status, plan to discharge home today and follow up as outpatient.    Assessment and Plan: * Acute on chronic systolic CHF (congestive heart failure) (HCC) Echocardiogram with reduced LV systolic function with EF 25%, LV with global  hypokinesis, LV cavity mild dilatation, mild LVH, RV systolic function with mild reduction, RVSP 50,8 mmHg. LA with severe dilatation, RA with mild dilatation, small pericardial effusion, severe mitral valve regurgitation.    01/22 cardiac catheterization  RA 13 RV 53/18  PCWP mean 32 PA 58/35 mean 45  Cardiac output 3,19 and index 2.27 (Fick).    Acute on chronic core pulmonale Pulmonary hypertension.    Patient was placed on IV furosemide  for diuresis, negative fluid balance was achieved, - 4,143 ml, with significant improvement in her symptoms.    Continue with SGLT 2 inh and spironolactone   Limited guideline directed medical therapy due to orthostatic hypotension and acute reduction in GFR.    Plan to continue to use furosemide  as needed.  Follow up with cardiology as outpatient, to assess mitral regurgitation.    Acute hypoxemic respiratory failure, acute cardiogenic pulmonary edema with bilateral pleural effusions.  Improved oxygenation, today 02 saturation is 97% to 99% on room air.      Stage 3a chronic kidney disease (CKD) (HCC) Hypokalemia. (Basal cr 1,2)    A the time of her discharge her serum cr is 1,34 with K at 4,4 and serum bicarbonate at 24  Na 137 P 4.9  Mag 2,2    On SGLT 2 inh and spironolactone . PRN loop diuretic for now.  Follow up renal function and electrolytes in 7 days as outpatient.    Asthma No acute exacerbation.    GERD (gastroesophageal reflux disease) Continue PPI    Anxiety Continue with as needed lorazepam .    Anemia of chronic disease Stable cell count.    Protein-calorie malnutrition, severe Continue nutritional supplements.  Patient lives alone with apparently no family support, will call his contact information in medical records.  She will need home health services.    Orthostatic hypotension Continue with fludrocortisone                 Consultants: cardiology   Outpatient Medications Prior to Visit  Medication  Sig Dispense Refill   albuterol  (PROVENTIL  HFA) 108 (90 Base) MCG/ACT inhaler Inhale 1-2 puffs into the lungs every 4 (four) hours as needed for wheezing or shortness of breath. 8.5 g 5   aspirin  81 MG tablet Take 81 mg by mouth daily.     budesonide -formoterol  (SYMBICORT ) 80-4.5 MCG/ACT inhaler Inhale 2 puffs into the lungs 2 (two) times daily. 1 each 11   Calcium -Magnesium-Vitamin D  (CALCIUM  500 PO) Take 1 tablet by mouth 2 (two) times daily with a meal. With lunch and supper     cholecalciferol  (VITAMIN D3) 25 MCG (1000 UNIT) tablet Take 1,000 Units by mouth daily.     [employer] OMEGA-3 KRILL OIL 500 MG CAPS Take 500 capsules by mouth daily.     Cyanocobalamin (VITAMIN B-12) 1000 MCG SUBL Place 1 tablet (1,000 mcg total) under the tongue in the morning. 100 tablet 3   fludrocortisone  (FLORINEF ) 0.1 MG tablet TAKE 0.5 TABLETS BY MOUTH TWICE DAILY AT 8AM AND 2PM (Patient taking differently: Take 0.05 mg by mouth 2 (two) times daily.)  90 tablet 3   furosemide  (LASIX ) 20 MG tablet Take 1 tablet (20 mg total) by mouth daily as needed for edema (for weight gain 2 to 3 lbs in 24 hrs or 5 lbs in 7 days.). 30 tablet 0   LORazepam  (ATIVAN ) 0.5 MG tablet Take 0.5-1 tablets (0.25-0.5 mg total) by mouth at bedtime as needed for anxiety (insomnia, overactive bladder). 30 tablet 2   pantoprazole  (PROTONIX ) 40 MG tablet Take 1 tablet (40 mg total) by mouth daily. 30 tablet 11   polyethylene glycol (MIRALAX  / GLYCOLAX ) 17 g packet Take 17 g by mouth daily.     Wheat Dextrin (BENEFIBER DRINK MIX PO) Take 1 Capful by mouth daily.     empagliflozin  (JARDIANCE ) 10 MG TABS tablet Take 1 tablet (10 mg total) by mouth daily. 30 tablet 0   spironolactone  (ALDACTONE ) 25 MG tablet Take 0.5 tablets (12.5 mg total) by mouth daily. 15 tablet 2   estradiol  (ESTRACE ) 0.1 MG/GM vaginal cream Place vaginally. (Patient not taking: Reported on 08/14/2023)     Facility-Administered Medications Prior to Visit  Medication Dose Route  Frequency Provider Last Rate Last Admin   Romosozumab -aqqg (EVENITY ) 105 MG/1. injection 210 mg  210 mg Subcutaneous Once Shamleffer, Ibtehal Jaralla, MD        ROS: Review of Systems  Constitutional:  Negative for activity change, appetite change, chills, fatigue and unexpected weight change.  HENT:  Negative for congestion, mouth sores and sinus pressure.   Eyes:  Negative for visual disturbance.  Respiratory:  Negative for cough and chest tightness.   Gastrointestinal:  Negative for abdominal pain and nausea.  Genitourinary:  Negative for difficulty urinating, frequency and vaginal pain.  Musculoskeletal:  Negative for back pain and gait problem.  Skin:  Negative for pallor and rash.  Neurological:  Negative for dizziness, tremors, weakness, numbness and headaches.  Psychiatric/Behavioral:  Negative for confusion and sleep disturbance.     Objective:  BP 90/68   Pulse 100   Temp (!) 97.4 F (36.3 C) (Oral)   Ht 5' 1 (1.549 m)   Wt 92 lb 12.8 oz (42.1 kg)   SpO2 93%   BMI 17.53 kg/m   BP Readings from Last 3 Encounters:  08/14/23 90/68  08/12/23 109/79  08/09/23 104/68    Wt Readings from Last 3 Encounters:  08/14/23 92 lb 12.8 oz (42.1 kg)  08/12/23 93 lb 6.4 oz (42.4 kg)  08/09/23 92 lb 6.4 oz (41.9 kg)    Physical Exam Constitutional:      General: She is not in acute distress.    Appearance: Normal appearance. She is well-developed. She is not ill-appearing.  HENT:     Head: Normocephalic.     Right Ear: External ear normal.     Left Ear: External ear normal.     Nose: Nose normal.  Eyes:     General:        Right eye: No discharge.        Left eye: No discharge.     Conjunctiva/sclera: Conjunctivae normal.     Pupils: Pupils are equal, round, and reactive to light.  Neck:     Thyroid : No thyromegaly.     Vascular: No JVD.     Trachea: No tracheal deviation.  Cardiovascular:     Rate and Rhythm: Normal rate and regular rhythm.     Heart sounds:  Normal heart sounds.  Pulmonary:     Effort: No respiratory distress.     Breath  sounds: No stridor. No wheezing.  Abdominal:     General: Bowel sounds are normal. There is no distension.     Palpations: Abdomen is soft. There is no mass.     Tenderness: There is no abdominal tenderness. There is no guarding or rebound.  Musculoskeletal:        General: No tenderness.     Cervical back: Normal range of motion and neck supple. No rigidity.  Lymphadenopathy:     Cervical: No cervical adenopathy.  Skin:    Findings: No erythema or rash.  Neurological:     Cranial Nerves: No cranial nerve deficit.     Motor: No abnormal muscle tone.     Coordination: Coordination normal.     Deep Tendon Reflexes: Reflexes normal.  Psychiatric:        Behavior: Behavior normal.        Thought Content: Thought content normal.        Judgment: Judgment normal.   Thin, using a cane   Lab Results  Component Value Date   WBC 5.7 08/02/2023   HGB 10.7 (L) 08/02/2023   HCT 33.2 (L) 08/02/2023   PLT 172 08/02/2023   GLUCOSE 93 08/03/2023   CHOL 227 (H) 07/12/2021   TRIG 45 07/12/2021   HDL 107 07/12/2021   LDLDIRECT 104.0 03/17/2013   LDLCALC 112 (H) 07/12/2021   ALT 52 (H) 07/31/2023   AST 53 (H) 07/31/2023   NA 137 08/03/2023   K 4.4 08/03/2023   CL 106 08/03/2023   CREATININE 1.34 (H) 08/03/2023   BUN 23 08/03/2023   CO2 24 08/03/2023   TSH 1.27 12/14/2022   INR 1.04 06/12/2016   HGBA1C 5.0 12/27/2021    No results found.  Assessment & Plan:   Problem List Items Addressed This Visit     Osteoporosis   The pt started Evenity       Coronary atherosclerosis - Primary   Relevant Medications   spironolactone  (ALDACTONE ) 25 MG tablet   Other Relevant Orders   CBC with Differential/Platelet   TSH   Comprehensive metabolic panel   Stage 3a chronic kidney disease (CKD) (HCC)   Relevant Orders   CBC with Differential/Platelet   TSH   Comprehensive metabolic panel   Acute on  chronic systolic CHF (congestive heart failure) (HCC)   Relevant Medications   spironolactone  (ALDACTONE ) 25 MG tablet   Other Relevant Orders   CBC with Differential/Platelet   TSH   Comprehensive metabolic panel      Meds ordered this encounter  Medications   empagliflozin  (JARDIANCE ) 10 MG TABS tablet    Sig: Take 1 tablet (10 mg total) by mouth daily.    Dispense:  90 tablet    Refill:  3   spironolactone  (ALDACTONE ) 25 MG tablet    Sig: Take 0.5 tablets (12.5 mg total) by mouth daily.    Dispense:  45 tablet    Refill:  3      Follow-up: Return in about 6 weeks (around 09/25/2023) for a follow-up visit.  Marolyn Noel, MD

## 2023-08-15 ENCOUNTER — Telehealth: Payer: Self-pay | Admitting: Internal Medicine

## 2023-08-15 NOTE — Telephone Encounter (Signed)
 Patient was int he hospital for congest heart failure schedule patient on 3/7. Patient wants to make sure that appt is okay to wait on. Please advise

## 2023-08-15 NOTE — Telephone Encounter (Signed)
 Spoke with pt who reports she is doing well since hospital discharge and was seen by her PCP yesterday 2/5.  Pt is asking if she should wait a month to see cardiology as she is scheduled with Dr Okey on 09/13/23.  Pt advised she also has an appointment with HF clinic on 09/09/23 and if she feels she is stable 1 month follow up will be fine.  Pt advised to continue to monitor her BP, HR and weight daily and call for any new or increasing symptoms.  Pt verbalizes understanding and agrees with current plan.

## 2023-08-16 NOTE — Assessment & Plan Note (Signed)
 The pt started Evenity 

## 2023-08-19 ENCOUNTER — Telehealth: Payer: Self-pay | Admitting: Internal Medicine

## 2023-08-19 NOTE — Telephone Encounter (Signed)
Copied from CRM [id number]. Topic: Referral - Status >> Aug 19, 2023  2:25 PM Fonda Kinder J wrote: Reason for CRM: Gracee from [facility] Wanted to notify pts PCP that she seen the pt and it's only going to be a one time evaluation because she is doing well and at the baseline function for mobility. You can contact Gracee at [phone] if there are any questions

## 2023-08-21 ENCOUNTER — Other Ambulatory Visit: Payer: Self-pay | Admitting: *Deleted

## 2023-08-21 ENCOUNTER — Telehealth: Payer: Self-pay | Admitting: *Deleted

## 2023-08-21 NOTE — Patient Outreach (Signed)
Care Management  Transitions of Care Program Transitions of Care Post-discharge week 3/ day # 16   08/21/2023 Name: Cindy Meza MRN: [id number] DOB: 1935-08-30  Subjective: Cindy Meza is a 88 y.o. year old female who is a primary care patient of [name], [name], MD. The Care Management team Engaged with patient Engaged with patient by telephone to assess and address transitions of care needs.   Consent to Services:  Patient was given information about care management services, agreed to services, and gave verbal consent to participate.  Enrolled 08/05/23  Assessment: "I am doing fine; everything is going well and I am not having any problems.  The visit with Dr. Posey Rea went fine, he wants me to have lab work done when I see him March.  No changes to medications and I am taking them all just like they have told me to.  Still waiting to hear from the kidney doctor that Dr. Lonzo Cloud sent the referral to on 08/09/23- thank you for giving me their phone number"  Denies clinical concerns and sounds to be in no distress throughout [facility] 30-day program outreach call today          SDOH Interventions    Flowsheet Row Telephone from 08/05/2023 in  POPULATION HEALTH DEPARTMENT ED to Hosp-Admission (Discharged) from 07/30/2023 in [facility] 3E HF PCU Clinical Support from 08/27/2022 in [facility] Clinical Support from 08/08/2020 in [facility] at [place]  SDOH Interventions      Food Insecurity Interventions Intervention Not Indicated -- Intervention Not Indicated Intervention Not Indicated  Housing Interventions Intervention Not Indicated -- Intervention Not Indicated Intervention Not Indicated  Transportation Interventions Intervention Not Indicated  [niece provides transportation] -- Intervention Not Indicated Intervention Not Indicated  Utilities Interventions Intervention Not Indicated --  Intervention Not Indicated --  Alcohol Usage Interventions -- Intervention Not Indicated (Score <7) Intervention Not Indicated (Score <7) --  Financial Strain Interventions -- Intervention Not Indicated Intervention Not Indicated Intervention Not Indicated  Physical Activity Interventions -- -- Intervention Not Indicated, Patient Refused Intervention Not Indicated, Patient Refused  Stress Interventions -- -- Intervention Not Indicated Intervention Not Indicated  Social Connections Interventions -- -- Intervention Not Indicated Intervention Not Indicated       Goals Addressed             This Visit's Progress    TOC 30-day Program Care Plan   On track    Current Barriers:  Medication management patient lives alone; niece assists with medication management: uses weekly pill planner box; extensive medication list; requires extra time to understand medication management Home Health services currently active with [facility] [phone]: patient has heard from agency; have not yet made initial home visit to establish care/ start of services; patient has contact information for agency [facility]; resides alone; good support through niece Cindy Meza: patient provided verbal consent for me to speak with Cindy Meza "at any time" during [facility] 30-day program outreach visits Hospitalization 1/22-25/2025 for Acute on chronic CHF- discharged to home/ self-care New to daily weight monitoring/ recording: will require ongoing reinforcement/ support Uses cane at baseline for mobility management; occasionally uses walker: no DME needs per patient at time of initial TOC call on 08/05/23  RNCM Clinical Goal(s):  Patient will work with the Care Management team over the next 30 days to address Transition of Care Barriers: Medication Management Provider appointments Home Health services verbalize basic understanding of  CHF  disease process and self health management plan as evidenced by patient reporting  during weekly TOC 30-day program outreach phone calls take all medications exactly as prescribed and will call provider for medication related questions as evidenced by patient reporting during weekly TOC 30day program outreach phone calls attend all scheduled medical appointments: cardiology provider: 08/12/23 as evidenced by review of EHR and with patient/ provider (as indicated) during weekly TOC 30-day program outreach phone calls work with [facility] home health RN to support patient at home post-hospital discharge on 08/03/23 as evidenced by review of same with patient/ collaboration as indicated with home health agency during weekly [facility] 30-day program outreach phone calls not experience hospital admission as evidenced by review of EMR. Hospital Admissions in last 6 months = 1  through collaboration with RN Care manager, provider, and care team.   Interventions: Evaluation of current treatment plan related to  self management and patient's adherence to plan as established by provider  Transitions of Care:  Goal on track:  Yes. 08/21/23 Durable Medical Equipment (DME) needs assessed with patient/caregiver Doctor Visits  - discussed the importance of doctor visits Post discharge activity limitations prescribed by provider reviewed Reviewed recent provider office visits with patient- 08/09/23- endocrinology provider (for osteoporosis) and 08/12/23 CHF clinic-- patient verbalizes good understanding of same; denies questions/ concerns post-recent provider office visits; she confirms no medication changes post- office visit Provided reinforcement around previously provided education/ guidance re: rationale for daily weight monitoring at home along with weight gain guidelines/ action plan for weight gain; importance of taking diuretic as prescribed  Reviewed recent weights at home with patient: she reports consistent weights between "89-91" lbs and denies overnight weight gain > 3 lbs/ weekly weight  gain > 5 lbs Provided education/ reinforcement re: signs and symptoms other than weight gain that indicate fluid retention Confirmed home health services still active- endorses active participation/ encouraged her ongoing engagement Confirmed uses assistive devices on regular basis, at baseline -- cane "mainly;" walker as indicated/ as needed; encouraged her use of assistive devices for mobility; fall prevention reinforced Reviewed upcoming provider office visits: 08/28/23- PCP AWE telephone visit; 09/09/23- CHF clinic; 09/13/23- cardiology provider office visit: confirmed patient is aware of all and has plans to attend as scheduled Reviewed renal provider referral placed on 08/09/23 by endocrinology provider- provided phone number to [facility], [phone]) so patient can contact them if she has questions about the referral: explained that referrals often require time prior to being addressed- patient will call if she has questions TOC 30-day program initial assessment completed today  Patient Goals/Self-Care Activities: Participate in Transition of Care Program/Attend TOC scheduled calls Take all medications as prescribed Attend all scheduled provider appointments Call provider office for new concerns or questions  Weigh yourself every day to stay on top of early fluid retention: write down your weights every day so you remember what it is from day to day Eat a heart healthy and low salt diet Continue pacing activity to avoid episodes of shortness of breath Use assistive devices (cane/ walker) as needed to prevent falls  Follow Up Plan:  Telephone follow up appointment with care management team member scheduled for:  Friday, August 30, 2023 at 1:00 pm         Plan: Telephone follow up appointment with care management team member scheduled for:   Friday, August 30, 2023 at 1:00 pm  Total time spent from review to signing of note/ including any care coordination  interventions:  56  minutes: patient very slow talker, requires repetition and reinforcement  Caryl Pina, RN, BSN, CCRN Alumnus RN Care Manager  Transitions of Care  [facility] - [facility] [phone]: direct office

## 2023-08-21 NOTE — Patient Outreach (Addendum)
Care Coordination   Care Coordination outreach- post [facility] 30-day program call earlier today  Visit Note   08/21/2023 Name: Cindy Meza MRN: [id number] DOB: August 23, 1935  Cindy Meza is a 88 y.o. year old female who sees [name], [name], MD for primary care. I spoke with  Cindy Meza by phone today.  What matters to the patients health and wellness today?   08/20/22: patient returned a call to me this afternoon: she wanted to tell me that she did contact [facility], she spoke with office staff who explained their tiered referral process: staff confirmed for patient that they have the referral and she will be contacted in the future to schedule; she is lowest risk on tiered referral system   Goals Addressed             This Visit's Progress    TOC 30-day Program Care Plan   On track    Current Barriers:  Medication management patient lives alone; niece assists with medication management: uses weekly pill planner box; extensive medication list; requires extra time to understand medication management Home Health services currently active with [facility] [phone]: patient has heard from agency; have not yet made initial home visit to establish care/ start of services; patient has contact information for agency [facility]; resides alone; good support through niece Cindy Meza: patient provided verbal consent for me to speak with Cindy Meza "at any time" during [facility] 30-day program outreach visits Hospitalization 1/22-25/2025 for Acute on chronic CHF- discharged to home/ self-care New to daily weight monitoring/ recording: will require ongoing reinforcement/ support Uses cane at baseline for mobility management; occasionally uses walker: no DME needs per patient at time of initial TOC call on 08/05/23  RNCM Clinical Goal(s):  Patient will work with the Care Management team over the next 30 days to address Transition of Care Barriers: Medication  Management Provider appointments Home Health services verbalize basic understanding of  CHF disease process and self health management plan as evidenced by patient reporting during weekly TOC 30-day program outreach phone calls take all medications exactly as prescribed and will call provider for medication related questions as evidenced by patient reporting during weekly TOC 30day program outreach phone calls attend all scheduled medical appointments: cardiology provider: 08/12/23 as evidenced by review of EHR and with patient/ provider (as indicated) during weekly TOC 30-day program outreach phone calls work with [facility] home health RN to support patient at home post-hospital discharge on 08/03/23 as evidenced by review of same with patient/ collaboration as indicated with home health agency during weekly [facility] 30-day program outreach phone calls not experience hospital admission as evidenced by review of EMR. Hospital Admissions in last 6 months = 1  through collaboration with RN Care manager, provider, and care team.   Interventions: Evaluation of current treatment plan related to  self management and patient's adherence to plan as established by provider  Transitions of Care:  Goal on track:  Yes. 08/21/23 Durable Medical Equipment (DME) needs assessed with patient/caregiver Doctor Visits  - discussed the importance of doctor visits Post discharge activity limitations prescribed by provider reviewed Reviewed recent provider office visits with patient- 08/09/23- endocrinology provider (for osteoporosis) and 08/12/23 CHF clinic-- patient verbalizes good understanding of same; denies questions/ concerns post-recent provider office visits; she confirms no medication changes post- office visit Provided reinforcement around previously provided education/ guidance re: rationale for daily weight monitoring at home along with weight gain guidelines/ action plan for weight gain; importance of  taking  diuretic as prescribed  Reviewed recent weights at home with patient: she reports consistent weights between "89-91" lbs and denies overnight weight gain > 3 lbs/ weekly weight gain > 5 lbs Provided education/ reinforcement re: signs and symptoms other than weight gain that indicate fluid retention Confirmed home health services still active- endorses active participation/ encouraged her ongoing engagement Confirmed uses assistive devices on regular basis, at baseline -- cane "mainly;" walker as indicated/ as needed; encouraged her use of assistive devices for mobility; fall prevention reinforced Reviewed upcoming provider office visits: 08/28/23- PCP AWE telephone visit; 09/09/23- CHF clinic; 09/13/23- cardiology provider office visit: confirmed patient is aware of all and has plans to attend as scheduled Reviewed renal provider referral placed on 08/09/23 by endocrinology provider- provided phone number to [facility], [phone]) so patient can contact them if she has questions about the referral: explained that referrals often require time prior to being addressed- patient will call if she has questions 08/20/22: patient returned a call to me this afternoon: she wanted to tell me that she did contact [facility], she spoke with office staff who explained their tiered referral process: staff confirmed for patient that they have the referral and she will be contacted in the future to schedule; she is lowest risk on tiered referral system TOC 30-day program initial assessment completed today  Patient Goals/Self-Care Activities: Participate in Transition of Care Program/Attend TOC scheduled calls Take all medications as prescribed Attend all scheduled provider appointments Call provider office for new concerns or questions  Weigh yourself every day to stay on top of early fluid retention: write down your weights every day so you remember what it is from day to day Eat a heart  healthy and low salt diet Continue pacing activity to avoid episodes of shortness of breath Use assistive devices (cane/ walker) as needed to prevent falls  Follow Up Plan:  Telephone follow up appointment with care management team member scheduled for:  Friday, August 30, 2023 at 1:00 pm         SDOH assessments and interventions completed:  No Previously completed- not indicated   Care Coordination Interventions:  Yes, provided   Follow up plan: Follow up call scheduled for Friday 08/30/23    Encounter Outcome:  Patient Visit Completed   Total time spent from review to signing of note/ including any care coordination interventions:  28 minutes  Caryl Pina, RN, BSN, Media planner  Transitions of Care  [facility] - [facility] [phone]: direct office

## 2023-08-21 NOTE — Patient Instructions (Signed)
Visit Information  Thank you for taking time to visit with me today. Please don't hesitate to contact me if I can be of assistance to you before our next scheduled telephone appointment.  Our next appointment is by telephone on Friday 08/21/23 at 1:00 pm  Please call the care guide team at [phone] if you need to cancel or reschedule your appointment.   Following are the goals we discussed today:  Patient Goals/Self-Care Activities: Participate in Transition of Care Program/Attend TOC scheduled calls Take all medications as prescribed Attend all scheduled provider appointments Call provider office for new concerns or questions  Weigh yourself every day to stay on top of early fluid retention: write down your weights every day so you remember what it is from day to day Eat a heart healthy and low salt diet Continue pacing activity to avoid episodes of shortness of breath Use assistive devices (cane/ walker) as needed to prevent falls  If you are experiencing a Mental Health or Behavioral Health Crisis or need someone to talk to, please  call the Suicide and Crisis Lifeline: 988 call the [place] National Suicide Prevention Lifeline: 7323825893 or TTY: 612-205-3702 TTY (618)040-4129) to talk to a trained counselor call 1-800-273-TALK (toll free, 24 hour hotline) go to [facility] [address] [place] [phone]) call the [facility] Line: [phone] call 911   The patient verbalized understanding of instructions, educational materials, and care plan provided today and DECLINED offer to receive copy of patient instructions, educational materials, and care plan.   Caryl Pina, RN, BSN, Media planner  Transitions of Care  [facility] - [facility] [phone]: direct office

## 2023-08-22 NOTE — Telephone Encounter (Signed)
Noted. Thx.

## 2023-08-28 ENCOUNTER — Ambulatory Visit: Payer: Medicare Other

## 2023-08-28 VITALS — Ht 61.0 in | Wt 92.0 lb

## 2023-08-28 DIAGNOSIS — Z0000 Encounter for general adult medical examination without abnormal findings: Secondary | ICD-10-CM | POA: Diagnosis not present

## 2023-08-28 NOTE — Patient Instructions (Addendum)
Ms. Loomer , Thank you for taking time to come for your Medicare Wellness Visit. I appreciate your ongoing commitment to your health goals. Please review the following plan we discussed and let me know if I can assist you in the future.   Referrals/Orders/Follow-Ups/Clinician Recommendations: It was nice talking with you today.  You are due for a eye exam and a Tetanus vaccine.  Each day, aim for 6 glasses of water, plenty of protein in your diet and try to get up and walk/ stretch every hour for 5-10 minutes at a time.    This is a list of the screening recommended for you and due dates:  Health Maintenance  Topic Date Due   DTaP/Tdap/Td vaccine (2 - Tdap) 06/09/2017   COVID-19 Vaccine (3 - Moderna risk series) 06/09/2020   Medicare Annual Wellness Visit  08/27/2024   Pneumonia Vaccine  Completed   Flu Shot  Completed   Zoster (Shingles) Vaccine  Completed   HPV Vaccine  Aged Out    Advanced directives: (Copy Requested) Please bring a copy of your health care power of attorney and living will to the office to be added to your chart at your convenience.  Next Medicare Annual Wellness Visit scheduled for next year: Yes

## 2023-08-28 NOTE — Progress Notes (Cosign Needed Addendum)
 Subjective:   Cindy Meza is a 88 y.o. female who presents for Medicare Annual (Subsequent) preventive examination.  Visit Complete: Virtual I connected with  Kristin Bruins on 08/28/23 by a audio enabled telemedicine application and verified that I am speaking with the correct person using two identifiers.  Patient Location: Home  Provider Location: Home Office  I discussed the limitations of evaluation and management by telemedicine. The patient expressed understanding and agreed to proceed.  Vital Signs: Because this visit was a virtual/telehealth visit, some criteria may be missing or patient reported. Any vitals not documented were not able to be obtained and vitals that have been documented are patient reported.   Cardiac Risk Factors include: advanced age (>24men, >67 women);Other (see comment), Risk factor comments: Asthma, CKD,     Objective:    Today's Vitals   08/28/23 1502  Weight: 92 lb (41.7 kg)  Height: 5\' 1"  (1.549 m)   Body mass index is 17.38 kg/m.     08/28/2023    3:07 PM 07/30/2023   11:19 AM 06/17/2023    3:36 PM 09/05/2022   10:36 AM 08/27/2022    4:08 PM 08/06/2022    1:04 PM 08/25/2021    9:01 AM  Advanced Directives  Does Patient Have a Medical Advance Directive? Yes No No Yes Yes Yes Yes  Type of Estate agent of [place];Living will   Healthcare Power of [employer];Living will;Out of facility DNR (pink MOST or yellow form) Healthcare Power of [place];Living will Healthcare Power of [place];Living will;Out of facility DNR (pink MOST or yellow form) Healthcare Power of [place];Living will  Does patient want to make changes to medical advance directive?       Yes (MAU/Ambulatory/Procedural Areas - Information given)  Copy of Healthcare Power of Attorney in Chart? No - copy requested    No - copy requested    Would patient like information on creating a medical advance directive?  Yes (ED - send information to MyChart)  No - Patient declined        Current Medications (verified) Outpatient Encounter Medications as of 08/28/2023  Medication Sig   albuterol (PROVENTIL HFA) 108 (90 Base) MCG/ACT inhaler Inhale 1-2 puffs into the lungs every 4 (four) hours as needed for wheezing or shortness of breath.   aspirin 81 MG tablet Take 81 mg by mouth daily.   budesonide-formoterol (SYMBICORT) 80-4.5 MCG/ACT inhaler Inhale 2 puffs into the lungs 2 (two) times daily.   Calcium-Magnesium-Vitamin D (CALCIUM 500 PO) Take 1 tablet by mouth 2 (two) times daily with a meal. With lunch and supper   cholecalciferol (VITAMIN D3) 25 MCG (1000 UNIT) tablet Take 1,000 Units by mouth daily.   [employer] OMEGA-3 KRILL OIL 500 MG CAPS Take 500 capsules by mouth daily.   Cyanocobalamin (VITAMIN B-12) 1000 MCG SUBL Place 1 tablet (1,000 mcg total) under the tongue in the morning.   empagliflozin (JARDIANCE) 10 MG TABS tablet Take 1 tablet (10 mg total) by mouth daily.   estradiol (ESTRACE) 0.1 MG/GM vaginal cream Place vaginally.   fludrocortisone (FLORINEF) 0.1 MG tablet TAKE 0.5 TABLETS BY MOUTH TWICE DAILY AT 8AM AND 2PM (Patient taking differently: Take 0.05 mg by mouth 2 (two) times daily.)   furosemide (LASIX) 20 MG tablet Take 1 tablet (20 mg total) by mouth daily as needed for edema (for weight gain 2 to 3 lbs in 24 hrs or 5 lbs in 7 days.).   LORazepam (ATIVAN) 0.5 MG tablet Take 0.5-1  tablets (0.25-0.5 mg total) by mouth at bedtime as needed for anxiety (insomnia, overactive bladder).   pantoprazole (PROTONIX) 40 MG tablet Take 1 tablet (40 mg total) by mouth daily.   polyethylene glycol (MIRALAX / GLYCOLAX) 17 g packet Take 17 g by mouth daily.   spironolactone (ALDACTONE) 25 MG tablet Take 0.5 tablets (12.5 mg total) by mouth daily.   Wheat Dextrin (BENEFIBER DRINK MIX PO) Take 1 Capful by mouth daily.   Facility-Administered Encounter Medications as of 08/28/2023  Medication   Romosozumab-aqqg (EVENITY) 105 MG/1. injection 210  mg    Allergies (verified) Fosamax [alendronate sodium], Gabapentin, Lidocaine, Midodrine, Risedronate sodium, Shellfish allergy, and Statins   History: Past Medical History:  Diagnosis Date   Abdominal pain 03/30/2015   Dr Leone Payor     /16, 1/21 ?fosamax related  S/p gyn eval, abd CT (-)  11/16 50% better  Better off Fosamax, off Miralax   12/22 Recurrent abd pain - pt saw Dr Leone Payor; CT w/constipation - using Miralax and Benefiber - better now     Allergy    Anemia    Asthma    Dr. Sherene Sires   Blood transfusion without reported diagnosis    Breast cyst    Left   Cataract    Dr. Nile Riggs   Cerumen impaction 04/06/2016   9/17 L, 10/19 B     Chest pressure 07/22/2012   1/14, 8/17  Recurrent - ?asthma related (11/17)  Advair MDI - use qd, not prn  We added Singulair  Cardiac w/up was (-) - --- Dr Tenny Craw     Closed right hip fracture (HCC) 06/12/2016   Dr Ave Filter  R hip fx - intermedullary nail 06/13/17 s/p   On Prolia, calcium, Vit D        Complication of anesthesia    ' I HAD HALLUCINATIONS WHEN THEY DID MY HIP "   GERD (gastroesophageal reflux disease)    Heart murmur    Hemorrhoids    History of colonic polyps Gessner   hx of adenomas (small) 1998   Hot flash, menopausal 02/15/2012   Ongoing   D/c estrogens due to breast abnormalities 2013     Nausea 02/12/2019   8/20 d/c Midodrin, Florinef  Occasional.   Nausea after taking Pantoprazole - take w/food     OA (osteoarthritis)    Orthostatic hypotension    Osteopenia    PAC (premature atrial contraction)    Upper respiratory infection 06/13/2023   Zpac if worse     Vitamin D deficiency    Past Surgical History:  Procedure Laterality Date   ABDOMINAL HYSTERECTOMY     complete   APPENDECTOMY     BREAST CYST EXCISION     CATARACT EXTRACTION, BILATERAL     04/23/18 left eye, 04/02/18 right eye   COLONOSCOPY  Multiple   Adenomatous colon polyps   ESOPHAGOGASTRODUODENOSCOPY  2010   INTRAMEDULLARY (IM) NAIL INTERTROCHANTERIC  Right 06/13/2016   Procedure: INTRAMEDULLARY (IM) NAIL INTERTROCHANTRIC;  Surgeon: Jones Broom, MD;  Location: [facility] OR;  Service: Orthopedics;  Laterality: Right;   RIGHT/LEFT HEART CATH AND CORONARY ANGIOGRAPHY N/A 07/31/2023   Procedure: RIGHT/LEFT HEART CATH AND CORONARY ANGIOGRAPHY;  Surgeon: Iran Ouch, MD;  Location: [facility] INVASIVE CV LAB;  Service: Cardiovascular;  Laterality: N/A;   TONSILLECTOMY     Family History  Problem Relation Age of Onset   Diabetes Mother 3       Deceased   Hypertension Other    Colon cancer Neg  Hx    Pancreatic cancer Neg Hx    Stomach cancer Neg Hx    Esophageal cancer Neg Hx    Rectal cancer Neg Hx    Social History   Socioeconomic History   Marital status: Widowed    Spouse name: Not on file   Number of children: Not on file   Years of education: Not on file   Highest education level: Not on file  Occupational History   Occupation: retired  Tobacco Use   Smoking status: Never   Smokeless tobacco: Never  Vaping Use   Vaping status: Never Used  Substance and Sexual Activity   Alcohol use: Not Currently    Alcohol/week: 0.0 standard drinks of alcohol   Drug use: No   Sexual activity: Not Currently  Other Topics Concern   Not on file  Social History Narrative   She lives alone in a [place] home.  No children.   Retired Child psychotherapist.   Highest level of education:  Masters degree      Right Handed    Lives in one story home / lives alone 2025   Social Drivers of Health   Financial Resource Strain: Low Risk  (08/28/2023)   Overall Financial Resource Strain (CARDIA)    Difficulty of Paying Living Expenses: Not hard at all  Food Insecurity: No Food Insecurity (08/28/2023)   Hunger Vital Sign    Worried About Running Out of Food in the Last Year: Never true    Ran Out of Food in the Last Year: Never true  Transportation Needs: No Transportation Needs (08/28/2023)   PRAPARE - Administrator, Civil Service (Medical):  No    Lack of Transportation (Non-Medical): No  Physical Activity: Inactive (08/28/2023)   Exercise Vital Sign    Days of Exercise per Week: 0 days    Minutes of Exercise per Session: 0 min  Stress: No Stress Concern Present (08/28/2023)   [employer] of Occupational Health - Occupational Stress Questionnaire    Feeling of Stress : Not at all  Social Connections: Moderately Integrated (08/28/2023)   Social Connection and Isolation Panel [NHANES]    Frequency of Communication with Friends and Family: More than three times a week    Frequency of Social Gatherings with Friends and Family: Never    Attends Religious Services: More than 4 times per year    Active Member of [employer] or Organizations: Yes    Attends Banker Meetings: Never    Marital Status: Widowed    Tobacco Counseling Counseling given: Not Answered   Clinical Intake:  Pre-visit preparation completed: Yes  Pain : No/denies pain     BMI - recorded: 17.38 Nutritional Status: BMI <19  Underweight Nutritional Risks: Nausea/ vomitting/ diarrhea (nausea) Diabetes: No  How often do you need to have someone help you when you read instructions, pamphlets, or other written materials from your doctor or pharmacy?: 1 - Never  Interpreter Needed?: No  Information entered by :: Delmon Andrada, RMA   Activities of Daily Living    08/28/2023    3:04 PM 07/30/2023    4:55 PM  In your present state of health, do you have any difficulty performing the following activities:  Hearing? 0 0  Vision? 0 0  Difficulty concentrating or making decisions? 0 0  Walking or climbing stairs? 0   Dressing or bathing? 0   Doing errands, shopping? 0 0  Preparing Food and eating ? N   Using  the Toilet? N   In the past six months, have you accidently leaked urine? Y   Do you have problems with loss of bowel control? N   Managing your Medications? N   Managing your Finances? N   Housekeeping or managing your Housekeeping?  N     Patient Care Team: [name], [name], MD as PCP - General Pricilla Riffle, MD as PCP - Cardiology (Cardiology) Noland Fordyce, MD (Obstetrics and Gynecology) Iva Boop, MD as Consulting Physician (Gastroenterology) [facility], Konrad Dolores, MD as Consulting Physician (Endocrinology) Jethro Bolus, MD as Consulting Physician (Ophthalmology) Glendale Chard, DO as Consulting Physician (Neurology) Michaela Corner, RN as VBCI Care Management  Indicate any recent Medical Services you may have received from other than [facility] providers in the past year (date may be approximate).     Assessment:   This is a routine wellness examination for Wheeler.  Hearing/Vision screen Hearing Screening - Comments:: Denies hearing difficulties   Vision Screening - Comments:: Denies vision issues.    Goals Addressed             This Visit's Progress    Patient Stated   Not on track    Would like to try gain weight  Still working on it-2025      Depression Screen    08/28/2023    3:13 PM 06/26/2023    2:30 PM 06/13/2023   11:16 AM 03/27/2023    2:26 PM 12/26/2022    2:22 PM 08/27/2022    4:14 PM 06/28/2022    2:51 PM  PHQ 2/9 Scores  PHQ - 2 Score 3 0 0 0 0 0 0  PHQ- 9 Score 4     0 0    Fall Risk    08/28/2023    3:07 PM 06/26/2023    2:30 PM 06/13/2023   11:16 AM 03/27/2023    2:26 PM 12/26/2022    2:22 PM  Fall Risk   Falls in the past year? 1 0 0  0  Number falls in past yr: 0 0 0 0 0  Injury with Fall? 0 0 0 0 0  Risk for fall due to :  No Fall Risks No Fall Risks No Fall Risks No Fall Risks  Follow up Falls evaluation completed;Falls prevention discussed Falls evaluation completed Falls evaluation completed Falls evaluation completed Falls evaluation completed    MEDICARE RISK AT HOME: Medicare Risk at Home Any stairs in or around the home?: Yes (in front of house) If so, are there any without handrails?: Yes Home free of loose throw rugs in walkways, pet  beds, electrical cords, etc?: No Adequate lighting in your home to reduce risk of falls?: Yes Life alert?: No Use of a cane, walker or w/c?: Yes (cane) Grab bars in the bathroom?: No Shower chair or bench in shower?: Yes Elevated toilet seat or a handicapped toilet?: Yes  TIMED UP AND GO:  Was the test performed?  No    Cognitive Function:        08/28/2023    3:09 PM 08/27/2022    4:14 PM  6CIT Screen  What Year? 0 points 0 points  What month? 0 points 0 points  What time? 0 points 0 points  Count back from 20 0 points 0 points  Months in reverse 0 points 0 points  Repeat phrase 0 points 0 points  Total Score 0 points 0 points    Immunizations Immunization History  Administered Date(s) Administered  Fluad Quad(high Dose 65+) 04/14/2019, 04/28/2020, 03/08/2021, 03/28/2022   Fluad Trivalent(High Dose 65+) 03/27/2023   Influenza Split 05/16/2011, 05/07/2012   Influenza Whole 04/29/2008, 04/27/2010   Influenza, High Dose Seasonal PF 04/04/2016, 04/09/2017, 04/10/2018   Influenza,inj,Quad PF,6+ Mos 03/17/2013, 03/23/2014, 03/30/2015   Moderna SARS-COV2 Booster Vaccination 05/12/2020   Moderna Sars-Covid-2 Vaccination 08/02/2019, 08/30/2019   PPD Test 06/15/2016   Pneumococcal Conjugate-13 09/17/2013   Pneumococcal Polysaccharide-23 02/02/2010   Td 06/10/2007   Zoster Recombinant(Shingrix) 08/11/2017, 03/17/2018   Zoster, Live 10/28/2007    TDAP status: Due, Education has been provided regarding the importance of this vaccine. Advised may receive this vaccine at local pharmacy or Health Dept. Aware to provide a copy of the vaccination record if obtained from local pharmacy or Health Dept. Verbalized acceptance and understanding.  Flu Vaccine status: Up to date  Pneumococcal vaccine status: Up to date  Covid-19 vaccine status: Information provided on how to obtain vaccines.   Qualifies for Shingles Vaccine? Yes   Zostavax completed Yes   Shingrix Completed?:  Yes  Screening Tests Health Maintenance  Topic Date Due   DTaP/Tdap/Td (2 - Tdap) 06/09/2017   COVID-19 Vaccine (3 - Moderna risk series) 06/09/2020   Medicare Annual Wellness (AWV)  08/27/2024   Pneumonia Vaccine 19+ Years old  Completed   INFLUENZA VACCINE  Completed   Zoster Vaccines- Shingrix  Completed   HPV VACCINES  Aged Out    Health Maintenance  Health Maintenance Due  Topic Date Due   DTaP/Tdap/Td (2 - Tdap) 06/09/2017   COVID-19 Vaccine (3 - Moderna risk series) 06/09/2020    Colorectal cancer screening: No longer required.   Mammogram status: Completed 05/15/2023. Repeat every year  Bone Density status: Completed 08/23/2021. Results reflect: Bone density results: OSTEOPOROSIS. Repeat every 2 years.  Lung Cancer Screening: (Low Dose CT Chest recommended if Age 13-80 years, 20 pack-year currently smoking OR have quit w/in 15years.) does not qualify.   Lung Cancer Screening Referral: N/A  Additional Screening:  Hepatitis C Screening: does not qualify;   Vision Screening: Recommended annual ophthalmology exams for early detection of glaucoma and other disorders of the eye. Is the patient up to date with their annual eye exam?  No  Who is the provider or what is the name of the office in which the patient attends annual eye exams? Referral If pt is not established with a provider, would they like to be referred to a provider to establish care? Yes .   Dental Screening: Recommended annual dental exams for proper oral hygiene  Community Resource Referral / Chronic Care Management: CRR required this visit?  No   CCM required this visit?  No     Plan:     I have personally reviewed and noted the following in the patient's chart:   Medical and social history Use of alcohol, tobacco or illicit drugs  Current medications and supplements including opioid prescriptions. Patient is not currently taking opioid prescriptions. Functional ability and  status Nutritional status Physical activity Advanced directives List of other physicians Hospitalizations, surgeries, and ER visits in previous 12 months Vitals Screenings to include cognitive, depression, and falls Referrals and appointments  In addition, I have reviewed and discussed with patient certain preventive protocols, quality metrics, and best practice recommendations. A written personalized care plan for preventive services as well as general preventive health recommendations were provided to patient.     Kinzee Happel L Sheilia Reznick, CMA   08/28/2023   After Visit Summary: (Mail) Due to  this being a telephonic visit, the after visit summary with patients personalized plan was offered to patient via mail   Nurse Notes: Patient is due for a eye exam and  tdap vaccine.  She had no other concerns to address today.   Medical screening examination/treatment/procedure(s) were performed by non-physician practitioner and as supervising physician I was immediately available for consultation/collaboration.  I agree with above. Jacinta Shoe, MD

## 2023-08-30 ENCOUNTER — Other Ambulatory Visit: Payer: Self-pay | Admitting: *Deleted

## 2023-08-30 NOTE — Patient Instructions (Signed)
Visit Information  Thank you for taking time to visit with me today. Please don't hesitate to contact me if I can be of assistance to you before our next scheduled telephone appointment.  Our next appointment is by telephone on  Thursday, September 05, 2023 at 1:00 pm   Please call the care guide team at [phone] if you need to cancel or reschedule your appointment.   Following are the goals we discussed today:  Patient Goals/Self-Care Activities: Participate in Transition of Care Program/Attend TOC scheduled calls Take all medications as prescribed Attend all scheduled provider appointments Call provider office for new concerns or questions  Continue to weigh yourself every day to stay on top of early fluid retention: write down your weights every day so you remember what it is from day to day Eat a heart healthy and low salt diet Continue pacing activity to avoid episodes of shortness of breath Use assistive devices (cane/ walker) as needed to prevent falls  If you are experiencing a Mental Health or Behavioral Health Crisis or need someone to talk to, please  call the Suicide and Crisis Lifeline: 988 call the [place] National Suicide Prevention Lifeline: 815-467-5094 or TTY: (209)540-8493 TTY 801-073-6220) to talk to a trained counselor call 1-800-273-TALK (toll free, 24 hour hotline) go to [facility] [address] [place] [phone]) call the [facility] Line: [phone] call 911   The patient verbalized understanding of instructions, educational materials, and care plan provided today and DECLINED offer to receive copy of patient instructions, educational materials, and care plan.   Caryl Pina, RN, BSN, Media planner  Transitions of Care  [facility] - [facility] [phone]: direct office

## 2023-08-30 NOTE — Patient Outreach (Signed)
Care Management  Transitions of Care Program Transitions of Care Post-discharge week 4/ day # 25   08/30/2023 Name: Cindy Meza MRN: [id number] DOB: 26-Mar-1936  Subjective: Cindy Meza is a 88 y.o. year old female who is a primary care patient of [name], Cindy Quint, MD. The Care Management team Engaged with patient Engaged with patient by telephone to assess and address transitions of care needs.   Consent to Services:  Patient was given information about care management services, agreed to services, and gave verbal consent to participate.   Enrolled in TOC 30-day program:  08/05/23  Assessment:  "I am doing about the same, not having any problems.  My weights are holding steady, right at 90 lbs, and I am not having any swelling.  If I ever get short of breath, I just slow down and use my inhaler if I need to, and I get better pretty quickly."    Denies clinical concerns and sounds to be in no distress throughout [facility] 30-day program outreach call today          SDOH Interventions    Flowsheet Row Clinical Support from 08/28/2023 in [facility] [place] Telephone from 08/05/2023 in [place] [facility] ED to Hosp-Admission (Discharged) from 07/30/2023 in [facility] 3E HF PCU Clinical Support from 08/27/2022 in [facility] Clinical Support from 08/08/2020 in [facility] [place]  SDOH Interventions       Food Insecurity Interventions Intervention Not Indicated Intervention Not Indicated -- Intervention Not Indicated Intervention Not Indicated  Housing Interventions Intervention Not Indicated Intervention Not Indicated -- Intervention Not Indicated Intervention Not Indicated  Transportation Interventions Intervention Not Indicated Intervention Not Indicated  [niece provides transportation] -- Intervention Not Indicated Intervention Not Indicated  Utilities  Interventions Intervention Not Indicated Intervention Not Indicated -- Intervention Not Indicated --  Alcohol Usage Interventions Intervention Not Indicated (Score <7) -- Intervention Not Indicated (Score <7) Intervention Not Indicated (Score <7) --  Depression Interventions/Treatment  PHQ2-9 Score <4 Follow-up Not Indicated -- -- -- --  Financial Strain Interventions Intervention Not Indicated -- Intervention Not Indicated Intervention Not Indicated Intervention Not Indicated  Physical Activity Interventions Intervention Not Indicated -- -- Intervention Not Indicated, Patient Refused Intervention Not Indicated, Patient Refused  Stress Interventions Intervention Not Indicated -- -- Intervention Not Indicated Intervention Not Indicated  Social Connections Interventions Intervention Not Indicated -- -- Intervention Not Indicated Intervention Not Indicated  Health Literacy Interventions Intervention Not Indicated -- -- -- --        Goals Addressed             This Visit's Progress    TOC 30-day Program Care Plan   On track    Current Barriers:  Medication management patient lives alone; niece assists with medication management: uses weekly pill planner box; extensive medication list; requires extra time to understand medication management Home Health services currently active with [facility] [phone]: patient has heard from agency; have not yet made initial home visit to establish care/ start of services; patient has contact information for agency Fragile state of health; resides alone; good support through niece [name]: patient provided verbal consent for me to speak with [name] "at any time" during [facility] 30-day program outreach visits Hospitalization 1/22-25/2025 for Acute on chronic CHF- discharged to home/ self-care New to daily weight monitoring/ recording: will require ongoing reinforcement/ support Uses cane at baseline for mobility management; occasionally uses walker:  no DME needs  per patient at time of initial TOC call on 08/05/23  RNCM Clinical Goal(s):  Patient will work with the Care Management team over the next 30 days to address Transition of Care Barriers: Medication Management Provider appointments Home Health services verbalize basic understanding of  CHF disease process and self health management plan as evidenced by patient reporting during weekly TOC 30-day program outreach phone calls take all medications exactly as prescribed and will call provider for medication related questions as evidenced by patient reporting during weekly TOC 30day program outreach phone calls attend all scheduled medical appointments: cardiology provider: 08/12/23 as evidenced by review of EHR and with patient/ provider (as indicated) during weekly TOC 30-day program outreach phone calls work with [facility] home health RN to support patient at home post-hospital discharge on 08/03/23 as evidenced by review of same with patient/ collaboration as indicated with home health agency during weekly [facility] 30-day program outreach phone calls not experience hospital admission as evidenced by review of EMR. Hospital Admissions in last 6 months = 1  through collaboration with RN Care manager, provider, and care team.   Interventions: Evaluation of current treatment plan related to  self management and patient's adherence to plan as established by provider  Transitions of Care:  Goal on track:  Yes. 08/30/23 Durable Medical Equipment (DME) needs assessed with patient/caregiver Doctor Visits  - discussed the importance of doctor visits Post discharge activity limitations prescribed by provider reviewed Discussed current clinical condition: reports "I am doing about the same, not having any problems.  My weights are holding steady, right at 90 lbs, and I am not having any swelling.  If I ever get short of breath, I just slow down and use my inhaler if I need to, and I get better pretty quickly."   Denies clinical concerns and sounds to be in no distress throughout [facility] 30-day program outreach call today Provided reinforcement around previously provided education/ guidance re: rationale for daily weight monitoring at home along with weight gain guidelines/ action plan for weight gain; importance of taking diuretic as prescribed  Reviewed recent weights at home with patient: she again reports consistent weights between "89-91" lbs and denies overnight weight gain > 3 lbs/ weekly weight gain > 5 lbs; reports weight this morning as "90.5 lbs" Provided education/ reinforcement re: signs and symptoms other than weight gain that indicate fluid retention Confirmed home health services still active- endorses active participation/ encouraged her ongoing engagement Confirmed uses assistive devices on regular basis, at baseline -- cane "mainly;" walker as indicated/ as needed; encouraged her use of assistive devices for mobility; fall prevention reinforced Reviewed upcoming provider office visits: 09/09/23- CHF clinic; 09/13/23- cardiology provider office visit; 09/24/23- PCP: confirmed patient is aware of all and has plans to attend as scheduled Confirmed no recent changes or concerns around medications: she verbalizes plans to discuss which vitamins she should be taking with her doctors at her upcoming scheduled provider office visits  Patient Goals/Self-Care Activities: Participate in Transition of Care Program/Attend TOC scheduled calls Take all medications as prescribed Attend all scheduled provider appointments Call provider office for new concerns or questions  Continue to weigh yourself every day to stay on top of early fluid retention: write down your weights every day so you remember what it is from day to day Eat a heart healthy and low salt diet Continue pacing activity to avoid episodes of shortness of breath Use assistive devices (cane/ walker) as needed to prevent falls  Follow Up Plan:   Telephone follow up appointment with care management team member scheduled for:  Thursday, September 05, 2023 at 1:00 pm         Plan: Telephone follow up appointment with care management team member scheduled for:   Thursday, September 05, 2023 at 1:00 pm  Total time spent from review to signing of note/ including any care coordination interventions:  34 minutes  Caryl Pina, RN, BSN, Media planner  Transitions of Care  [facility]  [place] [phone]: direct office

## 2023-09-02 ENCOUNTER — Telehealth: Payer: Self-pay | Admitting: Internal Medicine

## 2023-09-02 NOTE — Telephone Encounter (Unsigned)
 Copied from CRM [id number]. Topic: Clinical - Medication Question >> Sep 02, 2023  3:21 PM Sonny Dandy B wrote: Reason for CRM: pt called with concerns to  medication side effects, would like to speak with her provider or nurse.Please call pt back at [phone]

## 2023-09-03 ENCOUNTER — Ambulatory Visit: Payer: Medicare Other | Admitting: Internal Medicine

## 2023-09-03 ENCOUNTER — Encounter: Payer: Self-pay | Admitting: Internal Medicine

## 2023-09-03 VITALS — BP 98/60 | HR 79 | Temp 97.6°F | Ht 61.0 in | Wt 90.0 lb

## 2023-09-03 DIAGNOSIS — E559 Vitamin D deficiency, unspecified: Secondary | ICD-10-CM

## 2023-09-03 DIAGNOSIS — M810 Age-related osteoporosis without current pathological fracture: Secondary | ICD-10-CM | POA: Diagnosis not present

## 2023-09-03 DIAGNOSIS — I340 Nonrheumatic mitral (valve) insufficiency: Secondary | ICD-10-CM

## 2023-09-03 DIAGNOSIS — N1831 Chronic kidney disease, stage 3a: Secondary | ICD-10-CM | POA: Diagnosis not present

## 2023-09-03 DIAGNOSIS — I951 Orthostatic hypotension: Secondary | ICD-10-CM | POA: Diagnosis not present

## 2023-09-03 NOTE — Assessment & Plan Note (Signed)
 Hydrate well Eat more protein Take 1st Florinef at 6-7 am with a glass of water. On florinef bid 12/20

## 2023-09-03 NOTE — Assessment & Plan Note (Signed)
 Core strengthening exercises Take Vit D3+K2, TUMs

## 2023-09-03 NOTE — Assessment & Plan Note (Signed)
 Jardiance caused nausea Hold it x 2 d, then re-start at 1/2 tab a day

## 2023-09-03 NOTE — Assessment & Plan Note (Signed)
Hydrate well, take fish oil 

## 2023-09-03 NOTE — Progress Notes (Signed)
 Subjective:  Patient ID: Cindy Meza, female    DOB: 1935/10/04  Age: 88 y.o. MRN: [id number]  CC: Cough (Cough, Nausea, and lower extremity pain since started taking Jardiance ... Discuss Evenity and Nephrology referral. Discuss if labs previously ordered is needed.)   HPI Cindy Meza presents for nausea from Jardiance C/o poor appetite, low albumin, CHF    Outpatient Medications Prior to Visit  Medication Sig Dispense Refill   albuterol (PROVENTIL HFA) 108 (90 Base) MCG/ACT inhaler Inhale 1-2 puffs into the lungs every 4 (four) hours as needed for wheezing or shortness of breath. 8.5 g 5   aspirin 81 MG tablet Take 81 mg by mouth daily.     budesonide-formoterol (SYMBICORT) 80-4.5 MCG/ACT inhaler Inhale 2 puffs into the lungs 2 (two) times daily. 1 each 11   Calcium-Magnesium-Vitamin D (CALCIUM 500 PO) Take 1 tablet by mouth 2 (two) times daily with a meal. With lunch and supper     cholecalciferol (VITAMIN D3) 25 MCG (1000 UNIT) tablet Take 1,000 Units by mouth daily.     [employer] OMEGA-3 KRILL OIL 500 MG CAPS Take 500 capsules by mouth daily.     Cyanocobalamin (VITAMIN B-12) 1000 MCG SUBL Place 1 tablet (1,000 mcg total) under the tongue in the morning. 100 tablet 3   furosemide (LASIX) 20 MG tablet Take 1 tablet (20 mg total) by mouth daily as needed for edema (for weight gain 2 to 3 lbs in 24 hrs or 5 lbs in 7 days.). 30 tablet 0   LORazepam (ATIVAN) 0.5 MG tablet Take 0.5-1 tablets (0.25-0.5 mg total) by mouth at bedtime as needed for anxiety (insomnia, overactive bladder). 30 tablet 2   pantoprazole (PROTONIX) 40 MG tablet Take 1 tablet (40 mg total) by mouth daily. 30 tablet 11   polyethylene glycol (MIRALAX / GLYCOLAX) 17 g packet Take 17 g by mouth daily.     spironolactone (ALDACTONE) 25 MG tablet Take 0.5 tablets (12.5 mg total) by mouth daily. 45 tablet 3   Wheat Dextrin (BENEFIBER DRINK MIX PO) Take 1 Capful by mouth daily.     empagliflozin (JARDIANCE) 10 MG  TABS tablet Take 1 tablet (10 mg total) by mouth daily. (Patient taking differently: Take 10 mg by mouth daily. 1/2 TABLET DAILY) 90 tablet 3   estradiol (ESTRACE) 0.1 MG/GM vaginal cream Place vaginally. (Patient not taking: Reported on 09/05/2023)     fludrocortisone (FLORINEF) 0.1 MG tablet TAKE 0.5 TABLETS BY MOUTH TWICE DAILY AT 8AM AND 2PM (Patient taking differently: Take 0.05 mg by mouth 2 (two) times daily.) 90 tablet 3   Facility-Administered Medications Prior to Visit  Medication Dose Route Frequency Provider Last Rate Last Admin   Romosozumab-aqqg (EVENITY) 105 MG/1. injection 210 mg  210 mg Subcutaneous Once Shamleffer, Konrad Dolores, MD        ROS: Review of Systems  Constitutional:  Negative for activity change, appetite change, chills, fatigue and unexpected weight change.  HENT:  Negative for congestion, mouth sores and sinus pressure.   Eyes:  Negative for visual disturbance.  Respiratory:  Negative for cough and chest tightness.   Gastrointestinal:  Negative for abdominal pain and nausea.  Genitourinary:  Negative for difficulty urinating, frequency and vaginal pain.  Musculoskeletal:  Negative for back pain and gait problem.  Skin:  Negative for pallor and rash.  Neurological:  Negative for dizziness, tremors, weakness, numbness and headaches.  Psychiatric/Behavioral:  Negative for confusion, sleep disturbance and suicidal ideas.  Objective:  BP 98/60   Pulse 79   Temp 97.6 F (36.4 C) (Oral)   Ht 5\' 1"  (1.549 m)   Wt 90 lb (40.8 kg)   SpO2 99%   BMI 17.01 kg/m   BP Readings from Last 3 Encounters:  09/13/23 104/72  09/09/23 110/74  09/03/23 98/60    Wt Readings from Last 3 Encounters:  09/13/23 93 lb 12.8 oz (42.5 kg)  09/09/23 93 lb 3.2 oz (42.3 kg)  09/03/23 90 lb (40.8 kg)    Physical Exam Constitutional:      General: She is not in acute distress.    Appearance: She is well-developed. She is obese.  HENT:     Head: Normocephalic.      Right Ear: External ear normal.     Left Ear: External ear normal.     Nose: Nose normal.  Eyes:     General:        Right eye: No discharge.        Left eye: No discharge.     Conjunctiva/sclera: Conjunctivae normal.     Pupils: Pupils are equal, round, and reactive to light.  Neck:     Thyroid: No thyromegaly.     Vascular: No JVD.     Trachea: No tracheal deviation.  Cardiovascular:     Rate and Rhythm: Normal rate and regular rhythm.     Heart sounds: Normal heart sounds.  Pulmonary:     Effort: No respiratory distress.     Breath sounds: No stridor. No wheezing.  Abdominal:     General: Bowel sounds are normal. There is no distension.     Palpations: Abdomen is soft. There is no mass.     Tenderness: There is no abdominal tenderness. There is no guarding or rebound.  Musculoskeletal:        General: No tenderness.     Cervical back: Normal range of motion and neck supple. No rigidity.     Right lower leg: No edema.     Left lower leg: No edema.  Lymphadenopathy:     Cervical: No cervical adenopathy.  Skin:    Findings: No erythema or rash.  Neurological:     Mental Status: She is oriented to person, place, and time.     Cranial Nerves: No cranial nerve deficit.     Motor: Weakness present. No abnormal muscle tone.     Coordination: Coordination abnormal.     Gait: Gait abnormal.     Deep Tendon Reflexes: Reflexes normal.  Psychiatric:        Behavior: Behavior normal.        Thought Content: Thought content normal.        Judgment: Judgment normal.     Lab Results  Component Value Date   WBC 5.7 08/02/2023   HGB 10.7 (L) 08/02/2023   HCT 33.2 (L) 08/02/2023   PLT 172 08/02/2023   GLUCOSE 101 (H) 09/09/2023   CHOL 227 (H) 07/12/2021   TRIG 45 07/12/2021   HDL 107 07/12/2021   LDLDIRECT 104.0 03/17/2013   LDLCALC 112 (H) 07/12/2021   ALT 52 (H) 07/31/2023   AST 53 (H) 07/31/2023   NA 138 09/09/2023   K 4.2 09/09/2023   CL 103 09/09/2023   CREATININE  1.10 (H) 09/09/2023   BUN 16 09/09/2023   CO2 24 09/09/2023   TSH 1.27 12/14/2022   INR 1.04 06/12/2016   HGBA1C 5.0 12/27/2021    No results found.  Assessment &  Plan:   Problem List Items Addressed This Visit     Vitamin D deficiency   Core strengthening exercises Take Vit D3+K2, TUMs      Osteoporosis   Core strengthening exercises Take Vit D3+K2, TUMs Evenity - ok to re-start      Orthostatic hypotension   Hydrate well Eat more protein Take 1st Florinef at 6-7 am with a glass of water. On florinef bid 12/20      Stage 3a chronic kidney disease (CKD) (HCC) - Primary   Hydrate well, take fish oil      Severe mitral regurgitation (Chronic)   Jardiance caused nausea Hold it x 2 d, then re-start at 1/2 tab a day         No orders of the defined types were placed in this encounter.     Follow-up: Return in about 6 weeks (around 10/15/2023) for a follow-up visit.  Sonda Primes, MD

## 2023-09-03 NOTE — Assessment & Plan Note (Signed)
 Core strengthening exercises Take Vit D3+K2, TUMs Evenity - ok to re-start

## 2023-09-03 NOTE — Telephone Encounter (Signed)
 Pt will be seeing PCP today.

## 2023-09-05 ENCOUNTER — Other Ambulatory Visit: Payer: Self-pay | Admitting: *Deleted

## 2023-09-05 NOTE — Patient Outreach (Addendum)
 Care Management  Transitions of Care Program Transitions of Care Post-discharge week # 5/ day # 31 TOC 30-day program case closure   09/05/2023 Name: Cindy Meza MRN: [id number] DOB: 1935/09/16  Subjective: Cindy Meza is a 88 y.o. year old female who is a primary care patient of [name], [name], MD. The Care Management team Engaged with patient Engaged with patient by telephone to assess and address transitions of care needs.   Consent to Services:  Patient was given information about care management services, agreed to services, and gave verbal consent to participate.  Enrolled into TOC 30-day program:  08/05/23; TOC 30-day program case closure: 09/05/23- goals successfully met/ transferred to longitudinal RN CM  Assessment: "I am doing okay.  I had a visit with Dr. Posey Cindy Meza yesterday, I was feeling a little nauseated.  He told me to hold off on taking the jardiance for 2 days and then to re-start it up again, but only at half the dose I was taking.  The home health PT is coming this afternoon.  And my daily weights at home are still staying right at 89-90 lbs- they really don't fluctuate; I do occasionally take the fluid pill as needed."    Denies clinical concerns and sounds to be in no distress throughout [facility] 30-day program outreach call today          SDOH Interventions    Flowsheet Row Clinical Support from 08/28/2023 in [facility] at [place] Telephone from 08/05/2023 in [place] POPULATION HEALTH DEPARTMENT ED to Hosp-Admission (Discharged) from 07/30/2023 in [facility] 3E HF PCU Clinical Support from 08/27/2022 in [facility] at [facility] Clinical Support from 08/08/2020 in [facility] at [place]  SDOH Interventions       Food Insecurity Interventions Intervention Not Indicated Intervention Not Indicated -- Intervention Not Indicated Intervention Not Indicated  Housing Interventions  Intervention Not Indicated Intervention Not Indicated -- Intervention Not Indicated Intervention Not Indicated  Transportation Interventions Intervention Not Indicated Intervention Not Indicated  [niece provides transportation] -- Intervention Not Indicated Intervention Not Indicated  Utilities Interventions Intervention Not Indicated Intervention Not Indicated -- Intervention Not Indicated --  Alcohol Usage Interventions Intervention Not Indicated (Score <7) -- Intervention Not Indicated (Score <7) Intervention Not Indicated (Score <7) --  Depression Interventions/Treatment  PHQ2-9 Score <4 Follow-up Not Indicated -- -- -- --  Financial Strain Interventions Intervention Not Indicated -- Intervention Not Indicated Intervention Not Indicated Intervention Not Indicated  Physical Activity Interventions Intervention Not Indicated -- -- Intervention Not Indicated, Patient Refused Intervention Not Indicated, Patient Refused  Stress Interventions Intervention Not Indicated -- -- Intervention Not Indicated Intervention Not Indicated  Social Connections Interventions Intervention Not Indicated -- -- Intervention Not Indicated Intervention Not Indicated  Health Literacy Interventions Intervention Not Indicated -- -- -- --        Goals Addressed             This Visit's Progress    TOC 30-day Program Care Plan   On track    Current Barriers:  Medication management patient lives alone; niece assists with medication management: uses weekly pill planner box; extensive medication list; requires extra time to understand medication management Home Health services currently active with [facility] [phone]: patient has heard from agency; have not yet made initial home visit to establish care/ start of services; patient has contact information for agency Fragile state of health; resides alone; good support through niece Cindy Meza: patient  provided verbal consent for me to speak with Cindy Meza "at any time" during  [facility] 30-day program outreach visits Hospitalization 1/22-25/2025 for Acute on chronic CHF- discharged to home/ self-care New to daily weight monitoring/ recording: will require ongoing reinforcement/ support Uses cane at baseline for mobility management; occasionally uses walker: no DME needs per patient at time of initial TOC call on 08/05/23  RNCM Clinical Goal(s):  Patient will work with the Care Management team over the next 30 days to address Transition of Care Barriers: Medication Management Provider appointments Home Health services verbalize basic understanding of  CHF disease process and self health management plan as evidenced by patient reporting during weekly TOC 30-day program outreach phone calls take all medications exactly as prescribed and will call provider for medication related questions as evidenced by patient reporting during weekly TOC 30day program outreach phone calls attend all scheduled medical appointments: cardiology provider: 08/12/23 as evidenced by review of EHR and with patient/ provider (as indicated) during weekly TOC 30-day program outreach phone calls work with [facility] home health RN to support patient at home post-hospital discharge on 08/03/23 as evidenced by review of same with patient/ collaboration as indicated with home health agency during weekly [facility] 30-day program outreach phone calls not experience hospital admission as evidenced by review of EMR. Hospital Admissions in last 6 months = 1  through collaboration with RN Care manager, provider, and care team.   Interventions: Evaluation of current treatment plan related to  self management and patient's adherence to plan as established by provider  Transitions of Care:  Goal Met. 09/05/23  TOC 30--day outreach completed; patient has successfully met/ accomplished her established goals for [facility] 30-day program without hospital readmission and was scheduled for ongoing follow up with longitudinal RN CM for  telephone visit on 09/18/23  Durable Medical Equipment (DME) needs assessed with patient/caregiver Doctor Visits  - discussed the importance of doctor visits Post discharge activity limitations prescribed by provider reviewed Discussed current clinical condition: reports "I am doing okay.  I had a visit with Dr. Posey Cindy Meza yesterday, I was feeling a little nauseated.  He told me to hold off on taking the jardiance for 2 days and then to re-start it up again, but only at half the dose I was taking.  The home health PT is coming this afternoon.  And my daily weights at home are still staying right at 89-90 lbs- they really don't fluctuate; I do occasionally take the fluid pill as needed."  Denies clinical concerns and sounds to be in no distress throughout [facility] 30-day program outreach call today Reviewed PCP office visit 09/04/23- verbalizes good understanding of post-visit instructions and denies questions: patient is very astute and adherent around her established plan of care Full medication review with updating medication list in EHR per patient report  Provided reinforcement around previously provided education/ guidance re: rationale for daily weight monitoring at home along with weight gain guidelines/ action plan for weight gain; importance of taking diuretic as prescribed  Reviewed recent weights at home with patient: she again reports consistent weights between "89-91" lbs and denies overnight weight gain > 3 lbs/ weekly weight gain > 5 lbs; reports weight this morning as "90.4 lbs" Provided education/ reinforcement re: signs and symptoms other than weight gain that indicate fluid retention positive reinforcement provided with encouragement to continue efforts with ongoing consistent daily weight monitoring/ recording at home Confirmed home health services still active- endorses active participation/ encouraged her ongoing engagement Confirmed uses assistive  devices on regular basis, at baseline --  cane "mainly;" walker as indicated/ as needed; encouraged her use of assistive devices for mobility; fall prevention reinforced Reviewed upcoming provider office visits: 09/09/23- CHF clinic; 09/13/23- cardiology provider office visit; 09/24/23- PCP: confirmed patient is aware of all and has plans to attend as scheduled  Patient Goals/Self-Care Activities: Take all medications as prescribed Attend all scheduled provider appointments Call provider office for new concerns or questions  Continue to weigh yourself every day to stay on top of early fluid retention: write down your weights every day so you remember what it is from day to day Eat a heart healthy and low salt diet Continue pacing activity to avoid episodes of shortness of breath Use assistive devices (cane/ walker) as needed to prevent falls  Follow Up Plan:  Telephone follow up appointment with care management team member scheduled for:  Wednesday, September 18, 2023 at 1:45 pm          Plan: Telephone follow up appointment with care management team member scheduled for:   Wednesday, September 18, 2023 at 1:45 pm with longitudinal RN CM [place]  Total time spent from review to signing of note/ including any care coordination interventions: 56 minutes Pls call/ message for questions,  Caryl Pina, RN, BSN, Media planner  Transitions of Care  [facility] - [facility] [phone]: direct office

## 2023-09-05 NOTE — Patient Instructions (Signed)
 Visit Information  Thank you for taking time to visit with me today. Please don't hesitate to contact me if I can be of assistance to you before our next scheduled telephone appointment.  Your next appointment is by telephone on Wednesday, September 18, 2023  at 1:45 pm with nurse Donn Pierini  Please call the care guide team at [phone] if you need to cancel or reschedule your appointment.   Following are the goals we discussed today:  Patient Goals/Self-Care Activities: Take all medications as prescribed Attend all scheduled provider appointments Call provider office for new concerns or questions  Continue to weigh yourself every day to stay on top of early fluid retention: write down your weights every day so you remember what it is from day to day Eat a heart healthy and low salt diet Continue pacing activity to avoid episodes of shortness of breath Use assistive devices (cane/ walker) as needed to prevent falls  If you are experiencing a Mental Health or Behavioral Health Crisis or need someone to talk to, please  call the Suicide and Crisis Lifeline: 988 call the [place] National Suicide Prevention Lifeline: 563-290-8877 or TTY: 878-185-7930 TTY 386-122-7880) to talk to a trained counselor call 1-800-273-TALK (toll free, 24 hour hotline) go to [facility] [address], [place] [phone]) call the [facility] Line: [phone] call 911   The patient verbalized understanding of instructions, educational materials, and care plan provided today and DECLINED offer to receive copy of patient instructions, educational materials, and care plan.   Pls call/ message for questions,  Caryl Pina, RN, BSN, CCRN Alumnus RN Care Manager  Transitions of Care  [facility] - [facility] [phone]: direct office

## 2023-09-09 ENCOUNTER — Encounter (HOSPITAL_COMMUNITY): Payer: Self-pay

## 2023-09-09 ENCOUNTER — Ambulatory Visit (HOSPITAL_COMMUNITY)
Admission: RE | Admit: 2023-09-09 | Discharge: 2023-09-09 | Disposition: A | Discharge status: Home / Self Care | Payer: Medicare Other | Source: Ambulatory Visit | Attending: Internal Medicine | Admitting: Internal Medicine

## 2023-09-09 VITALS — BP 110/74 | HR 90 | Ht 61.0 in | Wt 93.2 lb

## 2023-09-09 DIAGNOSIS — I428 Other cardiomyopathies: Secondary | ICD-10-CM | POA: Diagnosis not present

## 2023-09-09 DIAGNOSIS — N1831 Chronic kidney disease, stage 3a: Secondary | ICD-10-CM | POA: Diagnosis not present

## 2023-09-09 DIAGNOSIS — I4510 Unspecified right bundle-branch block: Secondary | ICD-10-CM

## 2023-09-09 DIAGNOSIS — I340 Nonrheumatic mitral (valve) insufficiency: Secondary | ICD-10-CM | POA: Insufficient documentation

## 2023-09-09 DIAGNOSIS — K219 Gastro-esophageal reflux disease without esophagitis: Secondary | ICD-10-CM | POA: Insufficient documentation

## 2023-09-09 DIAGNOSIS — I951 Orthostatic hypotension: Secondary | ICD-10-CM

## 2023-09-09 DIAGNOSIS — J45909 Unspecified asthma, uncomplicated: Secondary | ICD-10-CM

## 2023-09-09 DIAGNOSIS — R5383 Other fatigue: Secondary | ICD-10-CM | POA: Diagnosis present

## 2023-09-09 DIAGNOSIS — I5023 Acute on chronic systolic (congestive) heart failure: Secondary | ICD-10-CM | POA: Diagnosis not present

## 2023-09-09 DIAGNOSIS — I491 Atrial premature depolarization: Secondary | ICD-10-CM

## 2023-09-09 DIAGNOSIS — I2510 Atherosclerotic heart disease of native coronary artery without angina pectoris: Secondary | ICD-10-CM

## 2023-09-09 DIAGNOSIS — F419 Anxiety disorder, unspecified: Secondary | ICD-10-CM

## 2023-09-09 DIAGNOSIS — I272 Pulmonary hypertension, unspecified: Secondary | ICD-10-CM

## 2023-09-09 DIAGNOSIS — I5022 Chronic systolic (congestive) heart failure: Secondary | ICD-10-CM | POA: Insufficient documentation

## 2023-09-09 DIAGNOSIS — D631 Anemia in chronic kidney disease: Secondary | ICD-10-CM | POA: Diagnosis not present

## 2023-09-09 DIAGNOSIS — I2781 Cor pulmonale (chronic): Secondary | ICD-10-CM

## 2023-09-09 DIAGNOSIS — I130 Hypertensive heart and chronic kidney disease with heart failure and stage 1 through stage 4 chronic kidney disease, or unspecified chronic kidney disease: Secondary | ICD-10-CM | POA: Diagnosis not present

## 2023-09-09 LAB — BASIC METABOLIC PANEL
Anion gap: 11 (ref 5–15)
BUN: 16 mg/dL (ref 8–23)
CO2: 24 mmol/L (ref 22–32)
Calcium: 9.5 mg/dL (ref 8.9–10.3)
Chloride: 103 mmol/L (ref 98–111)
Creatinine, Ser: 1.1 mg/dL — ABNORMAL HIGH (ref 0.44–1.00)
GFR, Estimated: 49 mL/min — ABNORMAL LOW (ref >60)
Glucose, Bld: 101 mg/dL — ABNORMAL HIGH (ref 70–99)
Potassium: 4.2 mmol/L (ref 3.5–5.1)
Sodium: 138 mmol/L (ref 135–145)

## 2023-09-09 LAB — BRAIN NATRIURETIC PEPTIDE: B Natriuretic Peptide: >4500 pg/mL — ABNORMAL HIGH (ref 0.0–100.0)

## 2023-09-09 NOTE — Progress Notes (Signed)
 HEART & VASCULAR TRANSITION OF CARE PROGRESS NOTE    Referring Physician: Dr. [name] Primary Care: [name], [name], MD  Primary Cardiologist: Dr. Tenny Craw  Chief Complaint: follow for systolic heart failure and mitral regurgitation   HPI: Referred to clinic by Dr. [name] for heart failure consultation.   Cindy Meza is a 88 y.o. female with chronic systolic HF, orthostatic intolerance, severe MR, asthma, GERD and fatigue. Has been intolerant of multiple medications and diuresis and GDMT has been limited by orthostatic hypotension.  Admitted 1/25 with heart failure exacerbation. Echo showed EF 25% and severe MR. Drop in EF was new from prior echo in 2019 which showed normal LVEF 50-55% and mod MR.  She underwent L/RHC which showed severely elevated filling pressures, moderate to severe pulmonary hypertension and normal cardiac index. NiCM: Mid LAD 30% stenosed, ost LAD to prox LAD 40 % stenosed. NiCM. Diuresed ~4L with improvement in symptoms. GDMT again limited with orthostatic hypotension.   She presents today for f/u in [facility] clinic. At initial assessment last month, she was overall feeling fair w/ NYHA Class II symptoms. She was noted to be euvolemic and orthostatic hypotension had limited up titration of GDMT. Unfortunately, she has been intolerant to midodrine given nausea.   Today, she is here today w/ her niece. She has been doing fairly well for the most part. Wt has been stable at home. Has only had to use lasix a few times PNR for mild ankle swelling. No edema appreciated on exam today. She is able to do most of her ADLs w/o much exertional dyspnea. Occasionally uses a cane for ambulation but most times able to get around w/o any assistive devices. She denies CP. She does get SOB/fatigued changing bed liens. She has felt slightly "off balance" since starting Jardiance and Cleda Daub but denies possitional dizziness. No syncope/ near syncope. BP today is 110/74. She saw her PCP  last wk and was told to hold Jardiance x 2 days, then to reduce down 1/2 tablet once daily. She has done so and despite this symptoms still persists.     Cardiac Testing 2D Echo 07/2023 1. Left ventricular ejection fraction, by estimation, is 25%. Left  ventricular ejection fraction by 3D volume is 33 %. The left ventricle has  severely decreased function. The left ventricle demonstrates global  hypokinesis with best preserved function at   the lateral base. The left ventricular internal cavity size was mildly  dilated. There is mild left ventricular hypertrophy. Left ventricular  diastolic parameters are indeterminate.   2. Right ventricular systolic function is mildly reduced. The right  ventricular size is normal. There is moderately elevated pulmonary artery  systolic pressure. The estimated right ventricular systolic pressure is  50.8 mmHg.   3. Left atrial size was severely dilated.   4. Right atrial size was mildly dilated.   5. A small pericardial effusion is present.   6. The mitral valve is grossly normal. Severe mitral valve regurgitation.  No evidence of mitral stenosis.   7. The aortic valve is grossly normal. There is mild thickening of the  aortic valve. Aortic valve regurgitation is trivial. No aortic stenosis is  present.   8. The inferior vena cava is normal in size with <50% respiratory  variability, suggesting right atrial pressure of 8 mmHg.    [facility] 07/2023   Mid LAD lesion is 30% stenosed.   Ost LAD to Prox LAD lesion is 40% stenosed.   1.  Short  left main coronary artery with moderate ostial LAD stenosis.  No evidence of obstructive disease overall. 2.  Left ventricular angiography was not performed.  EF was severely reduced by echo. 3.  Right heart catheterization showed severely elevated filling pressures, moderate to severe pulmonary hypertension and normal cardiac index.   RA: 13 mmHg RV: 53/18 mmHg PW: 35 mmHg with no large V waves. PA: 58/35 with a  mean of 45 mmHg. LVEDP is 40 mmHg. Cardiac output: 3.19 with an index of 2.27.   Past Medical History:  Diagnosis Date   Abdominal pain 03/30/2015   Dr Leone Payor     /16, 1/21 ?fosamax related  S/p gyn eval, abd CT (-)  11/16 50% better  Better off Fosamax, off Miralax   12/22 Recurrent abd pain - pt saw Dr Leone Payor; CT w/constipation - using Miralax and Benefiber - better now     Allergy    Anemia    Asthma    Dr. Sherene Sires   Blood transfusion without reported diagnosis    Breast cyst    Left   Cataract    Dr. Nile Riggs   Cerumen impaction 04/06/2016   9/17 L, 10/19 B     Chest pressure 07/22/2012   1/14, 8/17  Recurrent - ?asthma related (11/17)  Advair MDI - use qd, not prn  We added Singulair  Cardiac w/up was (-) - --- Dr Tenny Craw     Closed right hip fracture (HCC) 06/12/2016   Dr Ave Filter  R hip fx - intermedullary nail 06/13/17 s/p   On Prolia, calcium, Vit D        Complication of anesthesia    ' I HAD HALLUCINATIONS WHEN THEY DID MY HIP "   GERD (gastroesophageal reflux disease)    Heart murmur    Hemorrhoids    History of colonic polyps Gessner   hx of adenomas (small) 1998   Hot flash, menopausal 02/15/2012   Ongoing   D/c estrogens due to breast abnormalities 2013     Nausea 02/12/2019   8/20 d/c Midodrin, Florinef  Occasional.   Nausea after taking Pantoprazole - take w/food     OA (osteoarthritis)    Orthostatic hypotension    Osteopenia    PAC (premature atrial contraction)    Upper respiratory infection 06/13/2023   Zpac if worse     Vitamin D deficiency     Current Outpatient Medications  Medication Sig Dispense Refill   albuterol (PROVENTIL HFA) 108 (90 Base) MCG/ACT inhaler Inhale 1-2 puffs into the lungs every 4 (four) hours as needed for wheezing or shortness of breath. 8.5 g 5   aspirin 81 MG tablet Take 81 mg by mouth daily.     budesonide-formoterol (SYMBICORT) 80-4.5 MCG/ACT inhaler Inhale 2 puffs into the lungs 2 (two) times daily. 1 each 11    Calcium-Magnesium-Vitamin D (CALCIUM 500 PO) Take 1 tablet by mouth 2 (two) times daily with a meal. With lunch and supper     cholecalciferol (VITAMIN D3) 25 MCG (1000 UNIT) tablet Take 1,000 Units by mouth daily.     [employer] OMEGA-3 KRILL OIL 500 MG CAPS Take 500 capsules by mouth daily.     Cyanocobalamin (VITAMIN B-12) 1000 MCG SUBL Place 1 tablet (1,000 mcg total) under the tongue in the morning. 100 tablet 3   empagliflozin (JARDIANCE) 10 MG TABS tablet Take 1 tablet (10 mg total) by mouth daily. 90 tablet 3   fludrocortisone (FLORINEF) 0.1 MG tablet TAKE 0.5 TABLETS BY MOUTH TWICE  DAILY AT 8AM AND 2PM (Patient taking differently: Take 0.05 mg by mouth 2 (two) times daily.) 90 tablet 3   furosemide (LASIX) 20 MG tablet Take 1 tablet (20 mg total) by mouth daily as needed for edema (for weight gain 2 to 3 lbs in 24 hrs or 5 lbs in 7 days.). 30 tablet 0   pantoprazole (PROTONIX) 40 MG tablet Take 1 tablet (40 mg total) by mouth daily. 30 tablet 11   polyethylene glycol (MIRALAX / GLYCOLAX) 17 g packet Take 17 g by mouth daily.     spironolactone (ALDACTONE) 25 MG tablet Take 0.5 tablets (12.5 mg total) by mouth daily. 45 tablet 3   Wheat Dextrin (BENEFIBER DRINK MIX PO) Take 1 Capful by mouth daily.     LORazepam (ATIVAN) 0.5 MG tablet Take 0.5-1 tablets (0.25-0.5 mg total) by mouth at bedtime as needed for anxiety (insomnia, overactive bladder). (Patient not taking: Reported on 09/09/2023) 30 tablet 2   Current Facility-Administered Medications  Medication Dose Route Frequency Provider Last Rate Last Admin   Romosozumab-aqqg (EVENITY) 105 MG/1. injection 210 mg  210 mg Subcutaneous Once Shamleffer, Konrad Dolores, MD        Allergies  Allergen Reactions   Fosamax [Alendronate Sodium]     Paresthesias, abd pain   Gabapentin     dizziness   Lidocaine Swelling    Of face and lips.  Novocaine does same thing.   Midodrine     Nausea    Risedronate Sodium     Knee pain   Shellfish  Allergy Hives   Statins     REACTION: pains      Social History   Socioeconomic History   Marital status: Widowed    Spouse name: Not on file   Number of children: Not on file   Years of education: Not on file   Highest education level: Not on file  Occupational History   Occupation: retired  Tobacco Use   Smoking status: Never   Smokeless tobacco: Never  Vaping Use   Vaping status: Never Used  Substance and Sexual Activity   Alcohol use: Not Currently    Alcohol/week: 0.0 standard drinks of alcohol   Drug use: No   Sexual activity: Not Currently  Other Topics Concern   Not on file  Social History Narrative   She lives alone in a [place] home.  No children.   Retired Child psychotherapist.   Highest level of education:  Masters degree      Right Handed    Lives in one story home / lives alone 2025   Social Drivers of Health   Financial Resource Strain: Low Risk  (08/28/2023)   Overall Financial Resource Strain (CARDIA)    Difficulty of Paying Living Expenses: Not hard at all  Food Insecurity: No Food Insecurity (08/28/2023)   Hunger Vital Sign    Worried About Running Out of Food in the Last Year: Never true    Ran Out of Food in the Last Year: Never true  Transportation Needs: No Transportation Needs (08/28/2023)   PRAPARE - Administrator, Civil Service (Medical): No    Lack of Transportation (Non-Medical): No  Physical Activity: Inactive (08/28/2023)   Exercise Vital Sign    Days of Exercise per Week: 0 days    Minutes of Exercise per Session: 0 min  Stress: No Stress Concern Present (08/28/2023)   [employer] of Occupational Health - Occupational Stress Questionnaire    Feeling of Stress :  Not at all  Social Connections: Moderately Integrated (08/28/2023)   Social Connection and Isolation Panel [NHANES]    Frequency of Communication with Friends and Family: More than three times a week    Frequency of Social Gatherings with Friends and Family: Never     Attends Religious Services: More than 4 times per year    Active Member of [employer] or Organizations: Yes    Attends Banker Meetings: Never    Marital Status: Widowed  Intimate Partner Violence: Patient Unable To Answer (08/28/2023)   Humiliation, Afraid, Rape, and Kick questionnaire    Fear of Current or Ex-Partner: Patient unable to answer    Emotionally Abused: Patient unable to answer    Physically Abused: Patient unable to answer    Sexually Abused: Patient unable to answer      Family History  Problem Relation Age of Onset   Diabetes Mother 34       Deceased   Hypertension Other    Colon cancer Neg Hx    Pancreatic cancer Neg Hx    Stomach cancer Neg Hx    Esophageal cancer Neg Hx    Rectal cancer Neg Hx     Vitals:   09/09/23 1337  BP: 110/74  Pulse: 90  SpO2: 95%  Weight: 42.3 kg (93 lb 3.2 oz)  Height: 5\' 1"  (1.549 m)    PHYSICAL EXAM: General:  Well appearing, thin elderly female. No respiratory difficulty HEENT: normal Neck: supple. no JVD.  Cor: PMI nondisplaced. Regular rate & rhythm. 2/6 MR murmur  Lungs: clear Abdomen: soft, nontender, nondistended.  Extremities: no cyanosis, clubbing, rash, thin ext, no edema Neuro: alert & oriented x 3, cranial nerves grossly intact. moves all 4 extremities w/o difficulty. Affect pleasant.   ECG: not performed   ASSESSMENT & PLAN: Chronic systolic heart failure - echo 1610 w/ normal EF - echo 1/25 EF 25%, LV with GHK, RV mildly reduced, LA severely dilated, RA mildly dilated, small pericardial effusion present - [facility] 1/25: c/w NICM, LHC showed mild nonobstructive CAD; RA 13, PA 58/35 mean 45 mmHg, PW: 35 mmHg with no large V waves, LVEDP 40, and marginal cardiac output: 3.19 with an index of 2.27 - NYHA Class II, confounded by age and fragility  - GDMT has been limited by orthostatic hypotension. BP stable today  - continue spiro 12.5 mg daily  - continue Jardiance 5 mg daily (reduced dose per  PCP). Check BMP/BNP today. If SCr elevated may need to d/c given reported symptoms.  - continue lasix PRN   2. Severe MR  - Echo 1/25 w/ severe MR, likely functional in setting of dilated LV - RHC w/ PW: 35 mmHg with no large V waves, PAP 58/35 with a mean of 45 mmHg - not surgical candidate given age and fragility. Fagrility may also limit MitraClip candidacy. At this time, would defer referral to structural  heart clinic to primary cardiology. Her symptoms seem decently improved w/ current GDMT, both spiro and Jardiance are helping w/ afterload and maintenance of euvolemia    3. Orthostatic hypotension - intolerant of midodrine (nausea) - BP today ok, check BMP  - encouraged compression stockings    Referred to HFSW (PCP, Medications, Transportation, ETOH Abuse, Drug Abuse, Insurance, Financial ): No Refer to Pharmacy: No Refer to Home Health: No Refer to Advanced Heart Failure Clinic: No  Refer to General Cardiology: Yes (already followed)   Keep upcomming appt w/ Dr. Tenny Craw on 3/7  Robbie Lis,  PA-C 09/09/2023

## 2023-09-09 NOTE — Patient Instructions (Addendum)
 No change in medications today. Labs today - will call you if abnormal. Keep your appointment with [facility] Cardiology - see below. Please call us at [phone] if any questions.

## 2023-09-10 ENCOUNTER — Telehealth (HOSPITAL_COMMUNITY): Payer: Self-pay | Admitting: *Deleted

## 2023-09-10 NOTE — Telephone Encounter (Signed)
 Called patient per Boyce Medici, PA with following lab results and instructions: "BNP (fluid marker) remains elevated despite near euvolemic volume status. I suspect elevation is due to severe Mitral regurgitation. She may benefit from evaluation for MitraClip (procedure to help fix the leaky heart valve) but we discussed this today and she should discuss further w/ her primary cardiologist at scheduled appt this coming Friday. Also instruct the patient to continue current meds for now. No recommended changes" Pt verbalized understanding of same; requested note be forwarded to cardiologist she is seeing this Friday. Note forwarded to Dietrich Pates as requested.

## 2023-09-10 NOTE — Progress Notes (Signed)
 Cardiology Office Note   Date:  09/15/2023   ID:  Cindy Meza, DOB 01-Jul-1936, MRN [id number]  PCP:  Tresa Garter, MD  Cardiologist:   Dietrich Pates, MD   Pt presents for follow up of HFrEF      History of Present Illness: Cindy Meza is a 88 y.o. female with a history  fatigue, orthostatic intolerance (maintained of florinef), mitral regurgitation, asthma, GERD  CT of abdomen showed aortic atherosclerosis  Last myoview in 2019 showed no ischemia EF down but previous OK      2019 Echo in 2019 LVEF 50 to 55% with mild diastolic dysfunction and mild MR   She did not tolerate rosuvastatin (constipation)    Sensitive to several meds   I last saw the pt in November 2024  She was doing OK at the time    Pt seen by A [name] on Jun 13, 2023 For SOB   Rx Inhaler and Zpack Seen in ER on 06/18/23  CXR wit bilateral pleural effusions. BNP 3167   Pt declined admission.  Rx lasix as needed  Pt seen by Harrell Lark on 07/11/23   Still not back to baseline   Recomm lasix as needed. Echo on 07/25/23 showed severe LV dysfunction with severe MR.    Pt admitted to [facility] hospital  Placed on IV lasix.   R/L heart cath showed mild CAD  Severely elevated filling pressures.   PCWP 32 Pt diuresed 2.4 L   Then refused care on 08/01/23  Accepted care on 08/02/23.  Sent home on  spironolactone and SGLT 2 inhibitor   Lasix as  needed for wt gain, edema  She was seen  by A [name] on 08/14/23  Continue spiro and SGLT2i  (dose cut in 1/2)   The pt denies CP   Breathing is fair   Denies palpitations  No dizziness  Trace edema at times   No palpitations  Does complain of nausea after taking meds    Before daily meds no nausea.  Having problems with sleep   Denies PND      Outpatient Medications Prior to Visit  Medication Sig Dispense Refill   albuterol (PROVENTIL HFA) 108 (90 Base) MCG/ACT inhaler Inhale 1-2 puffs into the lungs every 4 (four) hours as needed for wheezing or shortness of breath.  8.5 g 5   aspirin 81 MG tablet Take 81 mg by mouth daily.     budesonide-formoterol (SYMBICORT) 80-4.5 MCG/ACT inhaler Inhale 2 puffs into the lungs 2 (two) times daily. 1 each 11   Calcium-Magnesium-Vitamin D (CALCIUM 500 PO) Take 1 tablet by mouth 2 (two) times daily with a meal. With lunch and supper     cholecalciferol (VITAMIN D3) 25 MCG (1000 UNIT) tablet Take 1,000 Units by mouth daily.     [employer] OMEGA-3 KRILL OIL 500 MG CAPS Take 500 capsules by mouth daily.     Cyanocobalamin (VITAMIN B-12) 1000 MCG SUBL Place 1 tablet (1,000 mcg total) under the tongue in the morning. 100 tablet 3   furosemide (LASIX) 20 MG tablet Take 1 tablet (20 mg total) by mouth daily as needed for edema (for weight gain 2 to 3 lbs in 24 hrs or 5 lbs in 7 days.). 30 tablet 0   LORazepam (ATIVAN) 0.5 MG tablet Take 0.5-1 tablets (0.25-0.5 mg total) by mouth at bedtime as needed for anxiety (insomnia, overactive bladder). 30 tablet 2   pantoprazole (PROTONIX) 40 MG tablet Take 1 tablet (40  mg total) by mouth daily. 30 tablet 11   polyethylene glycol (MIRALAX / GLYCOLAX) 17 g packet Take 17 g by mouth daily.     spironolactone (ALDACTONE) 25 MG tablet Take 0.5 tablets (12.5 mg total) by mouth daily. 45 tablet 3   Wheat Dextrin (BENEFIBER DRINK MIX PO) Take 1 Capful by mouth daily.     empagliflozin (JARDIANCE) 10 MG TABS tablet Take 1 tablet (10 mg total) by mouth daily. (Patient taking differently: Take 10 mg by mouth daily. 1/2 TABLET DAILY) 90 tablet 3   fludrocortisone (FLORINEF) 0.1 MG tablet TAKE 0.5 TABLETS BY MOUTH TWICE DAILY AT 8AM AND 2PM (Patient taking differently: Take 0.05 mg by mouth 2 (two) times daily.) 90 tablet 3   Facility-Administered Medications Prior to Visit  Medication Dose Route Frequency Provider Last Rate Last Admin   Romosozumab-aqqg (EVENITY) 105 MG/1. injection 210 mg  210 mg Subcutaneous Once Shamleffer, Konrad Dolores, MD         Allergies:   Fosamax [alendronate sodium],  Gabapentin, Lidocaine, Midodrine, Risedronate sodium, Shellfish allergy, and Statins   Past Medical History:  Diagnosis Date   Abdominal pain 03/30/2015   Dr Leone Payor     /16, 1/21 ?fosamax related  S/p gyn eval, abd CT (-)  11/16 50% better  Better off Fosamax, off Miralax   12/22 Recurrent abd pain - pt saw Dr Leone Payor; CT w/constipation - using Miralax and Benefiber - better now     Allergy    Anemia    Asthma    Dr. Sherene Sires   Blood transfusion without reported diagnosis    Breast cyst    Left   Cataract    Dr. Nile Riggs   Cerumen impaction 04/06/2016   9/17 L, 10/19 B     Chest pressure 07/22/2012   1/14, 8/17  Recurrent - ?asthma related (11/17)  Advair MDI - use qd, not prn  We added Singulair  Cardiac w/up was (-) - --- Dr Tenny Craw     Closed right hip fracture (HCC) 06/12/2016   Dr Ave Filter  R hip fx - intermedullary nail 06/13/17 s/p   On Prolia, calcium, Vit D        Complication of anesthesia    ' I HAD HALLUCINATIONS WHEN THEY DID MY HIP "   GERD (gastroesophageal reflux disease)    Heart murmur    Hemorrhoids    History of colonic polyps Gessner   hx of adenomas (small) 1998   Hot flash, menopausal 02/15/2012   Ongoing   D/c estrogens due to breast abnormalities 2013     Nausea 02/12/2019   8/20 d/c Midodrin, Florinef  Occasional.   Nausea after taking Pantoprazole - take w/food     OA (osteoarthritis)    Orthostatic hypotension    Osteopenia    PAC (premature atrial contraction)    Upper respiratory infection 06/13/2023   Zpac if worse     Vitamin D deficiency     Past Surgical History:  Procedure Laterality Date   ABDOMINAL HYSTERECTOMY     complete   APPENDECTOMY     BREAST CYST EXCISION     CATARACT EXTRACTION, BILATERAL     04/23/18 left eye, 04/02/18 right eye   COLONOSCOPY  Multiple   Adenomatous colon polyps   ESOPHAGOGASTRODUODENOSCOPY  2010   INTRAMEDULLARY (IM) NAIL INTERTROCHANTERIC Right 06/13/2016   Procedure: INTRAMEDULLARY (IM) NAIL  INTERTROCHANTRIC;  Surgeon: Jones Broom, MD;  Location: [facility] OR;  Service: Orthopedics;  Laterality: Right;   RIGHT/LEFT  HEART CATH AND CORONARY ANGIOGRAPHY N/A 07/31/2023   Procedure: RIGHT/LEFT HEART CATH AND CORONARY ANGIOGRAPHY;  Surgeon: Iran Ouch, MD;  Location: [facility] INVASIVE CV LAB;  Service: Cardiovascular;  Laterality: N/A;   TONSILLECTOMY       Social History:  The patient  reports that she has never smoked. She has never used smokeless tobacco. She reports that she does not currently use alcohol. She reports that she does not use drugs.   Family History:  The patient's family history includes Diabetes (age of onset: 3) in her mother; Hypertension in an other family member.  No history of CAD known    Mother had PPM    ROS:  Please see the history of present illness. All other systems are reviewed and  Negative to the above problem except as noted.    PHYSICAL EXAM: VS:  BP 104/72   Pulse (!) 115   Ht 5\' 1"  (1.549 m)   Wt 93 lb 12.8 oz (42.5 kg)   SpO2 95%   BMI 17.72 kg/m    GEN: Thin 88 year old in no acute distress  HEENT: normal  Neck: JVP is not elevated   Cardiac: RRR; no murmur    Respiratory:  clear to auscultation  GI: soft, nontender  No hepatomegaly Ext   No significant edema      EKG:  EKG is not ordered today  R/L heart cath  07/31/23    Mid LAD lesion is 30% stenosed.   Ost LAD to Prox LAD lesion is 40% stenosed.   1.  Short left main coronary artery with moderate ostial LAD stenosis.  No evidence of obstructive disease overall. 2.  Left ventricular angiography was not performed.  EF was severely reduced by echo. 3.  Right heart catheterization showed severely elevated filling pressures, moderate to severe pulmonary hypertension and normal cardiac index.   RA: 13 mmHg RV: 53/18 mmHg PW: 35 mmHg with no large V waves. PA: 58/35 with a mean of 45 mmHg. LVEDP is 40 mmHg. Cardiac output: 3.19 with an index of 2.27.   Recommendations: The  patient has nonischemic cardiomyopathy could be due to severe mitral regurgitation.  She is severely volume overloaded and spite of IV diuresis.  This will be continued for now. Consider evaluation for mitral valve clip once heart failure is optimized.  However, considering her age and frail status, she might not be a candidate.        Echo  07/25/23  1  Left ventricular ejection fraction, by estimation, is 25% . Left ventricular ejection fraction by 3D volume is 33 % . The left ventricle has severely decreased function. The left ventricle demonstrates global hypokinesis with best preserved function at the lateral base. The left ventricular internal cavity size was mildly dilated. There is mild left ventricular hypertrophy. Left ventricular diastolic parameters are indeterminate.   2  Right ventricular systolic function is mildly reduced. The right ventricular size is normal. There is moderately elevated pulmonary artery systolic pressure. The estimated right ventricular systolic pressure is 50. 8 mmHg.  3. Left atrial size was severely dilated.  4. Right atrial size was mildly dilated.  5. A small pericardial effusion is present.  6. The mitral valve is grossly normal. Severe mitral valve regurgitation. No evidence of mitral stenosis.  7. The aortic valve is grossly normal. There is mild thickening of the aortic valve. Aortic valve regurgitation is trivial. No aortic stenosis is present.  8. The inferior vena cava is normal in  size with < 50% respiratory variability, suggesting right atrial pressure of 8 mmHg. Lipid Panel    Component Value Date/Time   CHOL 227 (H) 07/12/2021 1222   TRIG 45 07/12/2021 1222   HDL 107 07/12/2021 1222   CHOLHDL 2.1 07/12/2021 1222   CHOLHDL 2 04/10/2018 1207   VLDL 13.6 04/10/2018 1207   LDLCALC 112 (H) 07/12/2021 1222   LDLDIRECT 104.0 03/17/2013 1026      Wt Readings from Last 3 Encounters:  09/13/23 93 lb 12.8 oz (42.5 kg)  09/09/23 93 lb 3.2 oz (42.3  kg)  09/03/23 90 lb (40.8 kg)      ASSESSMENT AND PLAN:  1  HFrEF.   Pt with severe LV dysfunction on echo in Jan 2025   Unable to review images    Also with severe MR   R heart cath showed severely elevated filling pressures (no V wave)  It appears she only diuresed 2.8 L She is thin  Lungs are clear  No LE edema       Will check labs today (BMET, BNP) She is on florinef and Jardiance and spironolactone  BP is marginal  I would stop florinef and Jardiance for now    Keep on spironolactone    Follow BP at home closely and wt   Has lasix to use as needed  Will review with interventional service  2  Hx orthostatic hypotension.   PT on florinef  She has been on this for awhile and was doing well in November  NOw, it is counterproductive.   Raising volume  Stop   Stop Jardiance for now as well     FOllow BP  3  CAD  Mild at [facility] in Jan 2025  4  HL  LDL 2023 was 112   Follow   PT intolerant to statins   Current medicines are reviewed at length with the patient today.  The patient does not have concerns regarding medicines.   Signed, Dietrich Pates, MD  09/15/2023 9:44 PM    [facility] [address] [place], [place], [place]  [address] Phone: [phone]; Fax: [phone]

## 2023-09-13 ENCOUNTER — Ambulatory Visit: Attending: Internal Medicine

## 2023-09-13 ENCOUNTER — Other Ambulatory Visit: Payer: Self-pay | Admitting: *Deleted

## 2023-09-13 ENCOUNTER — Encounter: Payer: Self-pay | Admitting: Internal Medicine

## 2023-09-13 ENCOUNTER — Ambulatory Visit: Payer: Medicare Other | Attending: Internal Medicine | Admitting: Internal Medicine

## 2023-09-13 VITALS — BP 104/72 | HR 115 | Ht 61.0 in | Wt 93.8 lb

## 2023-09-13 DIAGNOSIS — R0609 Other forms of dyspnea: Secondary | ICD-10-CM | POA: Diagnosis not present

## 2023-09-13 DIAGNOSIS — I5023 Acute on chronic systolic (congestive) heart failure: Secondary | ICD-10-CM | POA: Diagnosis not present

## 2023-09-13 DIAGNOSIS — R000 Tachycardia, unspecified: Secondary | ICD-10-CM

## 2023-09-13 DIAGNOSIS — I5022 Chronic systolic (congestive) heart failure: Secondary | ICD-10-CM

## 2023-09-13 DIAGNOSIS — I951 Orthostatic hypotension: Secondary | ICD-10-CM | POA: Diagnosis not present

## 2023-09-13 NOTE — Patient Instructions (Signed)
 Medication Instructions:  STOP FLORINEF AND JARDIANCE  *If you need a refill on your cardiac medications before your next appointment, please call your pharmacy*   Lab Work: [facility] BNP, CMET CBC AND TSH If you have labs (blood work) drawn today and your tests are completely normal, you will receive your results only by: MyChart Message (if you have MyChart) OR A paper copy in the mail If you have any lab test that is abnormal or we need to change your treatment, we will call you to review the results.   Testing/Procedures: Cindy Meza- Long Term Monitor Instructions  Your physician has requested you wear a ZIO patch monitor for 14 days.  This is a single patch monitor. [employer] supplies one patch monitor per enrollment. Additional stickers are not available. Please do not apply patch if you will be having a Nuclear Stress Test,  Echocardiogram, Cardiac CT, MRI, or Chest Xray during the period you would be wearing the  monitor. The patch cannot be worn during these tests. You cannot remove and re-apply the  ZIO XT patch monitor.  Your ZIO patch monitor will be mailed 3 day USPS to your address on file. It may take 3-5 days  to receive your monitor after you have been enrolled.  Once you have received your monitor, please review the enclosed instructions. Your monitor  has already been registered assigning a specific monitor serial # to you.  Billing and Patient Assistance Program Information  We have supplied [employer] with any of your insurance information on file for billing purposes. [employer] offers a sliding scale Patient Assistance Program for patients that do not have  insurance, or whose insurance does not completely cover the cost of the ZIO monitor.  You must apply for the Patient Assistance Program to qualify for this discounted rate.  To apply, please call [employer] at [phone], select option 4, select option 2, ask to apply for  Patient Assistance Program. Meredeth Ide will ask  your household income, and how many people  are in your household. They will quote your out-of-pocket cost based on that information.  [employer] will also be able to set up a 25-month, interest-free payment plan if needed.  Applying the monitor   Shave hair from upper left chest.  Hold abrader disc by orange tab. Rub abrader in 40 strokes over the upper left chest as  indicated in your monitor instructions.  Clean area with 4 enclosed alcohol pads. Let dry.  Apply patch as indicated in monitor instructions. Patch will be placed under collarbone on left  side of chest with arrow pointing upward.  Rub patch adhesive wings for 2 minutes. Remove white label marked "1". Remove the white  label marked "2". Rub patch adhesive wings for 2 additional minutes.  While looking in a mirror, press and release button in center of patch. A small green light will  flash 3-4 times. This will be your only indicator that the monitor has been turned on.  Do not shower for the first 24 hours. You may shower after the first 24 hours.  Press the button if you feel a symptom. You will hear a small click. Record Date, Time and  Symptom in the Patient Logbook.  When you are ready to remove the patch, follow instructions on the last 2 pages of Patient  Logbook. Stick patch monitor onto the last page of Patient Logbook.  Place Patient Logbook in the blue and white box. Use locking tab on box and tape box closed  securely. The blue and white box has prepaid postage on it. Please place it in the mailbox as  soon as possible. Your physician should have your test results approximately 7 days after the  monitor has been mailed back to [facility].  Call [facility] Customer Care at [phone] if you have questions regarding  your ZIO XT patch monitor. Call them immediately if you see an orange light blinking on your  monitor.  If your monitor falls off in less than 4 days, contact our Monitor department at  [phone].  If your monitor becomes loose or falls off after 4 days call [employer] at [phone] for  suggestions on securing your monitor 3 DAY     Follow-Up: At [facility], you and your health needs are our priority.  As part of our continuing mission to provide you with exceptional heart care, we have created designated Provider Care Teams.  These Care Teams include your primary Cardiologist (physician) and Advanced Practice Providers (APPs -  Physician Assistants and Nurse Practitioners) who all work together to provide you with the care you need, when you need it.  We recommend signing up for the patient portal called "MyChart".  Sign up information is provided on this After Visit Summary.  MyChart is used to connect with patients for Virtual Visits (Telemedicine).  Patients are able to view lab/test results, encounter notes, upcoming appointments, etc.  Non-urgent messages can be sent to your provider as well.   To learn more about what you can do with MyChart, go to [email or web address].    Your next appointment:   PENDING TEST   Provider:      Other Instructions WEIGH DAILY

## 2023-09-13 NOTE — Progress Notes (Unsigned)
Applied a 3 day Zio XT monitor to patient in the office 

## 2023-09-16 ENCOUNTER — Encounter: Payer: Self-pay | Admitting: Internal Medicine

## 2023-09-19 ENCOUNTER — Ambulatory Visit: Payer: Self-pay

## 2023-09-19 LAB — CBC
Hematocrit: 35.3 % (ref 34.0–46.6)
Hemoglobin: 11.2 g/dL (ref 11.1–15.9)
MCH: 30.6 pg (ref 26.6–33.0)
MCHC: 31.7 g/dL (ref 31.5–35.7)
MCV: 96 fL (ref 79–97)
Platelets: 217 10*3/uL (ref 150–450)
RBC: 3.66 x10E6/uL — ABNORMAL LOW (ref 3.77–5.28)
RDW: 12.3 % (ref 11.7–15.4)
WBC: 4.9 10*3/uL (ref 3.4–10.8)

## 2023-09-19 LAB — TSH: TSH: 1.58 u[IU]/mL (ref 0.450–4.500)

## 2023-09-19 LAB — COMPREHENSIVE METABOLIC PANEL
ALT: 15 IU/L (ref 0–32)
AST: 23 IU/L (ref 0–40)
Albumin: 4 g/dL (ref 3.7–4.7)
Alkaline Phosphatase: 42 IU/L — ABNORMAL LOW (ref 44–121)
BUN/Creatinine Ratio: 16 (ref 12–28)
BUN: 17 mg/dL (ref 8–27)
Bilirubin Total: 0.4 mg/dL (ref 0.0–1.2)
CO2: 21 mmol/L (ref 20–29)
Calcium: 9.4 mg/dL (ref 8.7–10.3)
Chloride: 105 mmol/L (ref 96–106)
Creatinine, Ser: 1.08 mg/dL — ABNORMAL HIGH (ref 0.57–1.00)
Globulin, Total: 2.7 g/dL (ref 1.5–4.5)
Glucose: 95 mg/dL (ref 70–99)
Potassium: 4.7 mmol/L (ref 3.5–5.2)
Sodium: 140 mmol/L (ref 134–144)
Total Protein: 6.7 g/dL (ref 6.0–8.5)
eGFR: 50 mL/min/{1.73_m2} — ABNORMAL LOW (ref >59)

## 2023-09-19 LAB — PRO B NATRIURETIC PEPTIDE: NT-Pro BNP: 20016 pg/mL — ABNORMAL HIGH (ref 0–738)

## 2023-09-19 NOTE — Patient Outreach (Signed)
 Care Coordination   Initial Visit Note   09/19/2023 Name: Cindy Meza MRN: [id number] DOB: [birth year]  Cindy Meza is a 88 y.o. year old female who sees [name], [name], MD for primary care. I spoke with  Cindy Meza by phone today.  What matters to the patients health and wellness today?  Received per [facility]. Patient admitted 07/30/23-08/03/23 with a/c heart failure. Patient reports completed follow up visit with PCP on 09/03/23 and cardiology visit on 09/13/23. Florinef and Jardiance discontinued-monitoring BP. She reports BP today 117/72.  She reports daily weights and states weights range 89.5-90 lbs and states her appetite is "good". [name] reports history of asthma and states she had to use her prn inhaler today, which she states is not unusual for her with change in season/pollen. She is without questions or concerns at this time and has an upcoming appointment with PCP next week.   Goals Addressed             This Visit's Progress    Manintain and/or improve health       Interventions Today    Flowsheet Row Most Recent Value  Chronic Disease   Chronic disease during today's visit Congestive Heart Failure (CHF), Other, Chronic Kidney Disease/End Stage Renal Disease (ESRD)  [osteoporosis, asthma]  General Interventions   General Interventions Discussed/Reviewed General Interventions Discussed, Doctor Visits  [Evaluation of current treatment plan for health condition and patient's adherence to plan.]  Doctor Visits Discussed/Reviewed PCP, Specialist  PCP/Specialist Visits Compliance with follow-up visit  [review upcoming/scheduled appointments]  Education Interventions   Education Provided Provided Education  [reviewed signs/symptoms of HF exacerbation and when to call the doctor.]  Provided Verbal Education On When to see the doctor, Medication  [advised to take medications as prescribed, attend provider visits as scheduled, contact provider with  health questions or concerns or worsening condition]  Nutrition Interventions   Nutrition Discussed/Reviewed Nutrition Discussed  [assessed appetitie. patient reports her appetite is good.]  Pharmacy Interventions   Pharmacy Dicussed/Reviewed Pharmacy Topics Discussed  [medications reviewed, assess if any medication management needs]            SDOH assessments and interventions completed:  No recently completed.   Care Coordination Interventions:  Yes, provided   Follow up plan: Follow up call scheduled for 10/09/23    Encounter Outcome:  Patient Visit Completed   Kathyrn Sheriff, RN, MSN, BSN, CCM [place]  [facility], Population Health Case Manager Phone: [phone]

## 2023-09-19 NOTE — Patient Instructions (Addendum)
 Visit Information  Thank you for taking time to visit with me today. Please don't hesitate to contact me if I can be of assistance to you.   Following are the goals we discussed today:  Continue to take medications as prescribed. Continue to attend provider visits as scheduled Continue to eat healthy, lean meats, vegetables, fruits, avoid saturated and transfats Contact provider with health questions or concerns as needed Continue to monitor for signs/symptoms of Heart failure exacerbation and contact provider as needed.   Our next appointment is by telephone on 10/09/23 at 2:00 pm  Please call the care guide team at [phone] if you need to cancel or reschedule your appointment.   If you are experiencing a Mental Health or Behavioral Health Crisis or need someone to talk to, please call the Suicide and Crisis Lifeline: 988 call the [place] National Suicide Prevention Lifeline: (302) 465-4428 or TTY: 636 572 1387 TTY 680-674-2955) to talk to a trained counselor   Kathyrn Sheriff, RN, MSN, BSN, CCM [place]  [facility], Population Health Case Manager Phone: [phone]

## 2023-09-23 ENCOUNTER — Telehealth: Payer: Self-pay

## 2023-09-23 NOTE — Telephone Encounter (Signed)
-----   Message from Dietrich Pates sent at 09/22/2023  9:52 PM EDT ----- Florentina Addison- I saw Arun in hallway.     He said to message you so that she could be set up in clinic with him Goal directed medical therapy may be difficult as BP has been marginal Has a monitor pending  Gunnar Fusi ----- Message ----- From: Orbie Pyo, MD Sent: 09/19/2023   3:18 PM EDT To: Pricilla Riffle, MD; Henrietta Dine, RN  Hi Gunnar Fusi,      Yes she looks to have significant functional MR.  She would need to be optimized first and then reassess her MR.  We can meet her first and then get her to AHF (it is required for Mitraclip).  She may not have BP room for GDMT so we could consider Mitraclip. ----- Message ----- From: Pricilla Riffle, MD Sent: 09/19/2023   1:34 PM EDT To: Orbie Pyo, MD  Cindy Meza--  Ms Sunderlin is a pt I have followed for orthostatic symptoms    I saw her in November 2024  Echo in 2019 LVEF normal   Mild MR Seen by PCP in Dec ([name]) with SOB  Treated with ABX   Echo ordered    This showed severe LVdysfunction.  Severe MR Pt admitted to [facility]    Had some diuresis    R/L heart cath showed minimal CAD   Elevated filling pressures Sent home on Jardiance In clinic on 3/7 BP 104/   HR 115     I told her to stop florinef first and hold jardiance for right  To follow BP with changes  Sent home with Zio to see if she was tachy a lot    She hasn't checked pressures yet but says her breathing stable, not dizzy   SHe just had labs     IF BP OK will start back jardiance and some diuretic  Question if she is a candidate for  MitraClip, if it would help her.  Would you look at her studies?

## 2023-09-23 NOTE — Telephone Encounter (Signed)
 Per Dr. Tenny Craw, scheduled patient with Dr. Lynnette Caffey 10/04/2023 to discuss MR. Cindy Meza was grateful for call and agreed with plan.

## 2023-09-24 ENCOUNTER — Ambulatory Visit: Payer: Medicare Other | Admitting: Internal Medicine

## 2023-09-25 ENCOUNTER — Encounter: Payer: Self-pay | Admitting: Internal Medicine

## 2023-09-25 ENCOUNTER — Ambulatory Visit: Payer: Medicare Other | Admitting: Internal Medicine

## 2023-09-25 VITALS — BP 104/62 | HR 98 | Temp 98.6°F | Ht 61.0 in | Wt 89.0 lb

## 2023-09-25 DIAGNOSIS — N1831 Chronic kidney disease, stage 3a: Secondary | ICD-10-CM

## 2023-09-25 DIAGNOSIS — E43 Unspecified severe protein-calorie malnutrition: Secondary | ICD-10-CM

## 2023-09-25 DIAGNOSIS — R634 Abnormal weight loss: Secondary | ICD-10-CM

## 2023-09-25 DIAGNOSIS — I951 Orthostatic hypotension: Secondary | ICD-10-CM

## 2023-09-25 DIAGNOSIS — I5023 Acute on chronic systolic (congestive) heart failure: Secondary | ICD-10-CM

## 2023-09-25 NOTE — Assessment & Plan Note (Signed)
 Protein drink daily

## 2023-09-25 NOTE — Assessment & Plan Note (Signed)
 Wt Readings from Last 3 Encounters:  09/25/23 89 lb (40.4 kg)  09/13/23 93 lb 12.8 oz (42.5 kg)  09/09/23 93 lb 3.2 oz (42.3 kg)  Protein drinks

## 2023-09-25 NOTE — Assessment & Plan Note (Signed)
 Hydrate well, take fish oil Off Jardiance 09/2021

## 2023-09-25 NOTE — Progress Notes (Signed)
 Subjective:  Patient ID: Cindy Meza, female    DOB: May 09, 1936  Age: 88 y.o. MRN: [id number]  CC: Medical Management of Chronic Issues (3 mnth f/u, nausea and off balance)   HPI Cindy Meza presents for CHF, weakness, asthma  Outpatient Medications Prior to Visit  Medication Sig Dispense Refill   albuterol (PROVENTIL HFA) 108 (90 Base) MCG/ACT inhaler Inhale 1-2 puffs into the lungs every 4 (four) hours as needed for wheezing or shortness of breath. 8.5 g 5   aspirin 81 MG tablet Take 81 mg by mouth daily.     budesonide-formoterol (SYMBICORT) 80-4.5 MCG/ACT inhaler Inhale 2 puffs into the lungs 2 (two) times daily. 1 each 11   calcium carbonate (TUMS) 500 MG chewable tablet Chew 1 tablet by mouth 3 (three) times daily.     cholecalciferol (VITAMIN D3) 25 MCG (1000 UNIT) tablet Take 1,000 Units by mouth daily.     [employer] OMEGA-3 KRILL OIL 500 MG CAPS Take 500 capsules by mouth daily.     Cyanocobalamin (VITAMIN B-12) 1000 MCG SUBL Place 1 tablet (1,000 mcg total) under the tongue in the morning. 100 tablet 3   furosemide (LASIX) 20 MG tablet Take 1 tablet (20 mg total) by mouth daily as needed for edema (for weight gain 2 to 3 lbs in 24 hrs or 5 lbs in 7 days.). 30 tablet 0   LORazepam (ATIVAN) 0.5 MG tablet Take 0.5-1 tablets (0.25-0.5 mg total) by mouth at bedtime as needed for anxiety (insomnia, overactive bladder). 30 tablet 2   pantoprazole (PROTONIX) 40 MG tablet Take 1 tablet (40 mg total) by mouth daily. 30 tablet 11   polyethylene glycol (MIRALAX / GLYCOLAX) 17 g packet Take 17 g by mouth daily.     spironolactone (ALDACTONE) 25 MG tablet Take 0.5 tablets (12.5 mg total) by mouth daily. 45 tablet 3   Vitamin D-Vitamin K (VITAMIN K2-VITAMIN D3 PO) Take 1 capsule by mouth daily.     Wheat Dextrin (BENEFIBER DRINK MIX PO) Take 1 Capful by mouth daily.     Calcium-Magnesium-Vitamin D (CALCIUM 500 PO) Take 1 tablet by mouth 2 (two) times daily with a meal. With lunch and  supper (Patient not taking: Reported on 09/19/2023)     Facility-Administered Medications Prior to Visit  Medication Dose Route Frequency Provider Last Rate Last Admin   Romosozumab-aqqg (EVENITY) 105 MG/1. injection 210 mg  210 mg Subcutaneous Once Shamleffer, Konrad Dolores, MD        ROS: Review of Systems  Constitutional:  Positive for fatigue. Negative for activity change, appetite change, chills and unexpected weight change.  HENT:  Negative for congestion, mouth sores and sinus pressure.   Eyes:  Negative for visual disturbance.  Respiratory:  Negative for cough and chest tightness.   Gastrointestinal:  Positive for abdominal pain and nausea.  Genitourinary:  Negative for difficulty urinating, frequency and vaginal pain.  Musculoskeletal:  Negative for back pain, gait problem and neck stiffness.  Skin:  Negative for pallor and rash.  Neurological:  Negative for dizziness, tremors, weakness, numbness and headaches.  Psychiatric/Behavioral:  Negative for confusion, decreased concentration, sleep disturbance and suicidal ideas.     Objective:  BP 104/62   Pulse 98   Temp 98.6 F (37 C) (Oral)   Ht 5\' 1"  (1.549 m)   Wt 89 lb (40.4 kg)   SpO2 99%   BMI 16.82 kg/m   BP Readings from Last 3 Encounters:  09/25/23 104/62  09/13/23 104/72  09/09/23 110/74    Wt Readings from Last 3 Encounters:  09/25/23 89 lb (40.4 kg)  09/13/23 93 lb 12.8 oz (42.5 kg)  09/09/23 93 lb 3.2 oz (42.3 kg)    Physical Exam Constitutional:      General: She is not in acute distress.    Appearance: She is well-developed. She is not ill-appearing or toxic-appearing.  HENT:     Head: Normocephalic.     Right Ear: External ear normal.     Left Ear: External ear normal.     Nose: Nose normal.  Eyes:     General:        Right eye: No discharge.        Left eye: No discharge.     Conjunctiva/sclera: Conjunctivae normal.     Pupils: Pupils are equal, round, and reactive to light.  Neck:      Thyroid: No thyromegaly.     Vascular: No JVD.     Trachea: No tracheal deviation.  Cardiovascular:     Rate and Rhythm: Normal rate and regular rhythm.     Heart sounds: Normal heart sounds.  Pulmonary:     Effort: No respiratory distress.     Breath sounds: No stridor. No wheezing.  Abdominal:     General: Bowel sounds are normal. There is no distension.     Palpations: Abdomen is soft. There is no mass.     Tenderness: There is no abdominal tenderness. There is no guarding or rebound.  Musculoskeletal:        General: No tenderness.     Cervical back: Normal range of motion and neck supple. No rigidity.     Right lower leg: No edema.     Left lower leg: No edema.  Lymphadenopathy:     Cervical: No cervical adenopathy.  Skin:    Findings: No erythema or rash.  Neurological:     Mental Status: She is oriented to person, place, and time.     Cranial Nerves: No cranial nerve deficit.     Motor: Weakness present. No abnormal muscle tone.     Coordination: Coordination normal.     Gait: Gait abnormal.     Deep Tendon Reflexes: Reflexes normal.  Psychiatric:        Behavior: Behavior normal.        Thought Content: Thought content normal.        Judgment: Judgment normal.     Lab Results  Component Value Date   WBC 4.9 09/18/2023   HGB 11.2 09/18/2023   HCT 35.3 09/18/2023   PLT 217 09/18/2023   GLUCOSE 95 09/18/2023   CHOL 227 (H) 07/12/2021   TRIG 45 07/12/2021   HDL 107 07/12/2021   LDLDIRECT 104.0 03/17/2013   LDLCALC 112 (H) 07/12/2021   ALT 15 09/18/2023   AST 23 09/18/2023   NA 140 09/18/2023   K 4.7 09/18/2023   CL 105 09/18/2023   CREATININE 1.08 (H) 09/18/2023   BUN 17 09/18/2023   CO2 21 09/18/2023   TSH 1.580 09/18/2023   INR 1.04 06/12/2016   HGBA1C 5.0 12/27/2021    No results found.  Assessment & Plan:   Problem List Items Addressed This Visit     Orthostatic hypotension   Off Florinef, Jardiance 09/2021      Weight loss - Primary    Wt Readings from Last 3 Encounters:  09/25/23 89 lb (40.4 kg)  09/13/23 93 lb 12.8 oz (42.5 kg)  09/09/23 93 lb  3.2 oz (42.3 kg)  Protein drinks       Stage 3a chronic kidney disease (CKD) (HCC)   Hydrate well, take fish oil Off Jardiance 09/2021      Acute on chronic systolic CHF (congestive heart failure) (HCC)   Off Florinef, Jardiance 09/2021      Protein-calorie malnutrition, severe   Protein drink daily         No orders of the defined types were placed in this encounter.     Follow-up: Return in about 2 months (around 11/25/2023) for a follow-up visit.  Sonda Primes, MD

## 2023-09-25 NOTE — Assessment & Plan Note (Signed)
 Off Florinef, Jardiance 09/2021

## 2023-09-27 ENCOUNTER — Other Ambulatory Visit: Payer: Self-pay | Admitting: Internal Medicine

## 2023-09-27 DIAGNOSIS — N1831 Chronic kidney disease, stage 3a: Secondary | ICD-10-CM

## 2023-09-29 LAB — LAB REPORT - SCANNED: Creatinine, POC: 194.7 mg/dL

## 2023-10-02 ENCOUNTER — Ambulatory Visit
Admission: RE | Admit: 2023-10-02 | Discharge: 2023-10-02 | Disposition: A | Discharge status: Home / Self Care | Source: Ambulatory Visit | Attending: Internal Medicine | Admitting: Internal Medicine

## 2023-10-02 DIAGNOSIS — N1831 Chronic kidney disease, stage 3a: Secondary | ICD-10-CM

## 2023-10-03 NOTE — Progress Notes (Unsigned)
 Patient ID: Blaklee Shores MRN: [id number] DOB/AGE: 11-07-1935 88 y.o.  Primary Care Physician:[name], [name], MD Primary Cardiologist: Tenny Craw  CC:  Mitral valvular disease management    FOCUSED PROBLEM LIST:   Mitral regurgitation Severe functional mitral regurgitation, EF 25 to 30% TTE January 2025 Nonischemic cardiomyopathy EF 25 to 30% TTE January 2025 CAD Mild, cardiac catheterization January 2025 Orthostatic hypotension Has limited GDMT Midodrine >> nausea Hyperlipidemia Aortic atherosclerosis CT abdomen pelvis 2022 CKD stage IIIa  March 2025:  Patient consents to use of AI scribe. The patient is a 88 year old female with the above listed medical problems referred for recommendations regarding her severe mitral regurgitation.  The patient had been admitted to the hospital in January due to an acute on chronic systolic heart failure exacerbation.  She was diuresed and medically stabilized.  She underwent coronary angiography and right heart catheterization which demonstrated minimal, nonobstructive coronary artery disease.  Her wedge pressure was 35 mmHg with RA pressure 13 mmHg.  Her PVR was low.  An echocardiogram demonstrated severe functional mitral regurgitation.  Her daily activities, such as cooking, laundry, and changing linens, have become more tiring, requiring more frequent rest. She experiences less frequent shortness of breath compared to before her hospitalization, but it has not completely subsided.  Her blood pressure has been low, and she experienced nausea with midodrine, a medication previously prescribed to manage her blood pressure. She is currently on spironolactone, taking half a tablet daily.  She has a history of asthma and uses inhalers, but notes that her asthma symptoms were less bothersome before her recent hospitalization. She does not frequently use her rescue inhaler now.  She reports persistent nausea, particularly in the mornings,  and occasional coughing. She previously experienced ankle swelling but notes that this is no longer an issue. No current swelling or difficulty breathing while lying flat.  A year ago, she did not experience these symptoms and was able to perform activities without limitations. She misses being able to do more physical activities, such as gardening, and feels limited compared to a year ago.     Past Medical History:  Diagnosis Date   Abdominal pain 03/30/2015   Dr Leone Payor     /16, 1/21 ?fosamax related  S/p gyn eval, abd CT (-)  11/16 50% better  Better off Fosamax, off Miralax   12/22 Recurrent abd pain - pt saw Dr Leone Payor; CT w/constipation - using Miralax and Benefiber - better now     Allergy    Anemia    Asthma    Dr. Sherene Sires   Blood transfusion without reported diagnosis    Breast cyst    Left   Cataract    Dr. Nile Riggs   Cerumen impaction 04/06/2016   9/17 L, 10/19 B     Chest pressure 07/22/2012   1/14, 8/17  Recurrent - ?asthma related (11/17)  Advair MDI - use qd, not prn  We added Singulair  Cardiac w/up was (-) - --- Dr Tenny Craw     Closed right hip fracture (HCC) 06/12/2016   Dr Ave Filter  R hip fx - intermedullary nail 06/13/17 s/p   On Prolia, calcium, Vit D        Complication of anesthesia    ' I HAD HALLUCINATIONS WHEN THEY DID MY HIP "   GERD (gastroesophageal reflux disease)    Heart murmur    Hemorrhoids    History of colonic polyps Gessner   hx of adenomas (small) 1998  Hot flash, menopausal 02/15/2012   Ongoing   D/c estrogens due to breast abnormalities 2013     Nausea 02/12/2019   8/20 d/c Midodrin, Florinef  Occasional.   Nausea after taking Pantoprazole - take w/food     OA (osteoarthritis)    Orthostatic hypotension    Osteopenia    PAC (premature atrial contraction)    Upper respiratory infection 06/13/2023   Zpac if worse     Vitamin D deficiency     Past Surgical History:  Procedure Laterality Date   ABDOMINAL HYSTERECTOMY     complete    APPENDECTOMY     BREAST CYST EXCISION     CATARACT EXTRACTION, BILATERAL     04/23/18 left eye, 04/02/18 right eye   COLONOSCOPY  Multiple   Adenomatous colon polyps   ESOPHAGOGASTRODUODENOSCOPY  2010   INTRAMEDULLARY (IM) NAIL INTERTROCHANTERIC Right 06/13/2016   Procedure: INTRAMEDULLARY (IM) NAIL INTERTROCHANTRIC;  Surgeon: Jones Broom, MD;  Location: [facility] OR;  Service: Orthopedics;  Laterality: Right;   RIGHT/LEFT HEART CATH AND CORONARY ANGIOGRAPHY N/A 07/31/2023   Procedure: RIGHT/LEFT HEART CATH AND CORONARY ANGIOGRAPHY;  Surgeon: Iran Ouch, MD;  Location: [facility] INVASIVE CV [facility];  Service: Cardiovascular;  Laterality: N/A;   TONSILLECTOMY      Family History  Problem Relation Age of Onset   Diabetes Mother 35       Deceased   Hypertension Other    Colon cancer Neg Hx    Pancreatic cancer Neg Hx    Stomach cancer Neg Hx    Esophageal cancer Neg Hx    Rectal cancer Neg Hx     Social History   Socioeconomic History   Marital status: Widowed    Spouse name: Not on file   Number of children: Not on file   Years of education: Not on file   Highest education level: Not on file  Occupational History   Occupation: retired  Tobacco Use   Smoking status: Never   Smokeless tobacco: Never  Vaping Use   Vaping status: Never Used  Substance and Sexual Activity   Alcohol use: Not Currently    Alcohol/week: 0.0 standard drinks of alcohol   Drug use: No   Sexual activity: Not Currently  Other Topics Concern   Not on file  Social History Narrative   She lives alone in a Onset home.  No children.   Retired Child psychotherapist.   Highest level of education:  Masters degree      Right Handed    Lives in one story home / lives alone 2025   Social Drivers of Health   Financial Resource Strain: Low Risk  (08/28/2023)   Overall Financial Resource Strain (CARDIA)    Difficulty of Paying Living Expenses: Not hard at all  Food Insecurity: No Food Insecurity (08/28/2023)   Hunger  Vital Sign    Worried About Running Out of Food in the Last Year: Never true    Ran Out of Food in the Last Year: Never true  Transportation Needs: No Transportation Needs (08/28/2023)   PRAPARE - Administrator, Civil Service (Medical): No    Lack of Transportation (Non-Medical): No  Physical Activity: Inactive (08/28/2023)   Exercise Vital Sign    Days of Exercise per Week: 0 days    Minutes of Exercise per Session: 0 min  Stress: No Stress Concern Present (08/28/2023)   [employer] of Occupational Health - Occupational Stress Questionnaire    Feeling of Stress :  Not at all  Social Connections: Moderately Integrated (08/28/2023)   Social Connection and Isolation Panel [NHANES]    Frequency of Communication with Friends and Family: More than three times a week    Frequency of Social Gatherings with Friends and Family: Never    Attends Religious Services: More than 4 times per year    Active Member of [employer] or Organizations: Yes    Attends Banker Meetings: Never    Marital Status: Widowed  Intimate Partner Violence: Patient Unable To Answer (08/28/2023)   Humiliation, Afraid, Rape, and Kick questionnaire    Fear of Current or Ex-Partner: Patient unable to answer    Emotionally Abused: Patient unable to answer    Physically Abused: Patient unable to answer    Sexually Abused: Patient unable to answer     Prior to Admission medications   Medication Sig Start Date End Date Taking? Authorizing Provider  albuterol (PROVENTIL HFA) 108 (90 Base) MCG/ACT inhaler Inhale 1-2 puffs into the lungs every 4 (four) hours as needed for wheezing or shortness of breath. 06/13/23   [name], [name], MD  aspirin 81 MG tablet Take 81 mg by mouth daily.    [provider]  budesonide-formoterol (SYMBICORT) 80-4.5 MCG/ACT inhaler Inhale 2 puffs into the lungs 2 (two) times daily. 06/26/23   [name], [name], MD  calcium carbonate (TUMS) 500 MG chewable tablet  Chew 1 tablet by mouth 3 (three) times daily.    [provider]  Calcium-Magnesium-Vitamin D (CALCIUM 500 PO) Take 1 tablet by mouth 2 (two) times daily with a meal. With lunch and supper Patient not taking: Reported on 09/19/2023      cholecalciferol (VITAMIN D3) 25 MCG (1000 UNIT) tablet Take 1,000 Units by mouth daily.    [provider]  [employer] OMEGA-3 KRILL OIL 500 MG CAPS Take 500 capsules by mouth daily.    [provider]  Cyanocobalamin (VITAMIN B-12) 1000 MCG SUBL Place 1 tablet (1,000 mcg total) under the tongue in the morning. 05/03/21   Arnaldo Natal, NP  furosemide (LASIX) 20 MG tablet Take 1 tablet (20 mg total) by mouth daily as needed for edema (for weight gain 2 to 3 lbs in 24 hrs or 5 lbs in 7 days.). 08/03/23   Arrien, York Ram, MD  LORazepam (ATIVAN) 0.5 MG tablet Take 0.5-1 tablets (0.25-0.5 mg total) by mouth at bedtime as needed for anxiety (insomnia, overactive bladder). 03/27/23   [name], [name], MD  pantoprazole (PROTONIX) 40 MG tablet Take 1 tablet (40 mg total) by mouth daily. 06/13/23   [name], [name], MD  polyethylene glycol (MIRALAX / GLYCOLAX) 17 g packet Take 17 g by mouth daily.    [provider]  spironolactone (ALDACTONE) 25 MG tablet Take 0.5 tablets (12.5 mg total) by mouth daily. 08/14/23   [name], [name], MD  Vitamin D-Vitamin K (VITAMIN K2-VITAMIN D3 PO) Take 1 capsule by mouth daily.    [provider]  Wheat Dextrin (BENEFIBER DRINK MIX PO) Take 1 Capful by mouth daily.    [provider]    Allergies  Allergen Reactions   Fosamax [Alendronate Sodium]     Paresthesias, abd pain   Gabapentin     dizziness   Lidocaine Swelling    Of face and lips.  Novocaine does same thing.   Midodrine     Nausea    Risedronate Sodium     Knee pain   Shellfish Allergy Hives   Statins  REACTION: pains    REVIEW OF SYSTEMS:  General: no fevers/chills/night sweats Eyes: no  blurry vision, diplopia, or amaurosis ENT: no sore throat or hearing loss Resp: no cough, wheezing, or hemoptysis CV: no edema or palpitations GI: no abdominal pain, nausea, vomiting, diarrhea, or constipation GU: no dysuria, frequency, or hematuria Skin: no rash Neuro: no headache, numbness, tingling, or weakness of extremities Musculoskeletal: no joint pain or swelling Heme: no bleeding, DVT, or easy bruising Endo: no polydipsia or polyuria  BP 100/64   Pulse 90   Ht 5\' 1"  (1.549 m)   Wt 85 lb (38.6 kg)   SpO2 96%   BMI 16.06 kg/m   PHYSICAL EXAM: GEN:  AO x 3 in no acute distress HEENT: normal Dentition: Normal Neck: JVP normal. +2carotid upstrokes without bruits. No thyromegaly. Lungs: equal expansion, clear bilaterally CV: Apex is discrete and nondisplaced, RRR without murmur or gallop Abd: soft, non-tender, non-distended; no bruit; positive bowel sounds Ext: no edema, ecchymoses, or cyanosis Vascular: 2+ femoral pulses, 2+ radial pulses       Skin: warm and dry without rash Neuro: CN II-XII grossly intact; motor and sensory grossly intact    DATA AND STUDIES:  EKG:  EKG Interpretation Date/Time:  Friday October 04 2023 13:48:41 EDT Ventricular Rate:  91 PR Interval:  128 QRS Duration:  118 QT Interval:  382 QTC Calculation: 469 R Axis:   106  Text Interpretation: Normal sinus rhythm with sinus arrhythmia Rightward axis Incomplete left bundle branch block ST & T wave abnormality, consider inferolateral ischemia When compared with ECG of 12-Aug-2023 12:13, Significant changes have occurred Confirmed by Alverda Skeans ([id number]) on 10/04/2023 1:49:54 PM        Cardiac Studies & Procedures   ______________________________________________________________________________________________ CARDIAC CATHETERIZATION  CARDIAC CATHETERIZATION 07/31/2023  Narrative   Mid LAD lesion is 30% stenosed.   Ost LAD to Prox LAD lesion is 40% stenosed.  1.  Short left main  coronary artery with moderate ostial LAD stenosis.  No evidence of obstructive disease overall. 2.  Left ventricular angiography was not performed.  EF was severely reduced by echo. 3.  Right heart catheterization showed severely elevated filling pressures, moderate to severe pulmonary hypertension and normal cardiac index.  RA: 13 mmHg RV: 53/18 mmHg PW: 35 mmHg with no large V waves. PA: 58/35 with a mean of 45 mmHg. LVEDP is 40 mmHg. Cardiac output: 3.19 with an index of 2.27.  Recommendations: The patient has nonischemic cardiomyopathy could be due to severe mitral regurgitation.  She is severely volume overloaded and spite of IV diuresis.  This will be continued for now. Consider evaluation for mitral valve clip once heart failure is optimized.  However, considering her age and frail status, she might not be a candidate.  Findings Coronary Findings Diagnostic  Dominance: Co-dominant  Left Anterior Descending Ost LAD to Prox LAD lesion is 40% stenosed. The lesion is eccentric. Mid LAD lesion is 30% stenosed.  Left Circumflex Vessel is angiographically normal.  Left Posterior Atrioventricular Artery Vessel is angiographically normal.  Right Coronary Artery Vessel is small. Vessel is angiographically normal.  Right Posterior Descending Artery Vessel is small in size. Vessel is angiographically normal.  Intervention  No interventions have been documented.   STRESS TESTS  MYOCARDIAL PERFUSION IMAGING 06/26/2018  Narrative  Nuclear stress EF: 41%.  There was no ST segment deviation noted during stress.  The left ventricular ejection fraction is moderately decreased (30-44%).  This is a low risk study.  1.  EF calculated as 41% but not sure gating was accurate.  Would suggest confirmation by echo. 2. No evidence for ischemia or infarction on perfusion images.  Overall, low risk study.   ECHOCARDIOGRAM  ECHOCARDIOGRAM COMPLETE  07/25/2023  Narrative ECHOCARDIOGRAM REPORT    Patient Name:   NECIE WILCOXSON Date of Exam: 07/25/2023 Medical Rec #:  [id number]       Height:       61.0 in Accession #:    [id number]      Weight:       98.0 lb Date of Birth:  [birth year]       BSA:          1.395 m Patient Age:    87 years        BP:           122/70 mmHg Patient Gender: F               HR:           102 bpm. Exam Location:  [address]  Procedure: 2D Echo, 3D Echo, Cardiac Doppler and Color Doppler  Indications:    CHF-Acute Systolic I50.21  History:        Patient has prior history of Echocardiogram examinations, most recent 04/20/2016. CHF, Arrythmias:PAC; Signs/Symptoms:Dyspnea.  Sonographer:    Eulah Pont RDCS Referring Phys: [id number] ALEKSEI V [name]  IMPRESSIONS   1. Left ventricular ejection fraction, by estimation, is 25%. Left ventricular ejection fraction by 3D volume is 33 %. The left ventricle has severely decreased function. The left ventricle demonstrates global hypokinesis with best preserved function at the lateral base. The left ventricular internal cavity size was mildly dilated. There is mild left ventricular hypertrophy. Left ventricular diastolic parameters are indeterminate. 2. Right ventricular systolic function is mildly reduced. The right ventricular size is normal. There is moderately elevated pulmonary artery systolic pressure. The estimated right ventricular systolic pressure is 50.8 mmHg. 3. Left atrial size was severely dilated. 4. Right atrial size was mildly dilated. 5. A small pericardial effusion is present. 6. The mitral valve is grossly normal. Severe mitral valve regurgitation. No evidence of mitral stenosis. 7. The aortic valve is grossly normal. There is mild thickening of the aortic valve. Aortic valve regurgitation is trivial. No aortic stenosis is present. 8. The inferior vena cava is normal in size with <50% respiratory variability, suggesting right atrial pressure  of 8 mmHg.  FINDINGS Left Ventricle: Left ventricular ejection fraction, by estimation, is 25%. Left ventricular ejection fraction by 3D volume is 33 %. The left ventricle has severely decreased function. The left ventricle demonstrates global hypokinesis. The left ventricular internal cavity size was mildly dilated. There is mild left ventricular hypertrophy. Left ventricular diastolic parameters are indeterminate.  Right Ventricle: The right ventricular size is normal. No increase in right ventricular wall thickness. Right ventricular systolic function is mildly reduced. There is moderately elevated pulmonary artery systolic pressure. The tricuspid regurgitant velocity is 3.27 m/s, and with an assumed right atrial pressure of 8 mmHg, the estimated right ventricular systolic pressure is 50.8 mmHg.  Left Atrium: Left atrial size was severely dilated.  Right Atrium: Right atrial size was mildly dilated.  Pericardium: A small pericardial effusion is present.  Mitral Valve: The mitral valve is grossly normal. Severe mitral valve regurgitation. No evidence of mitral valve stenosis.  Tricuspid Valve: The tricuspid valve is normal in structure. Tricuspid valve regurgitation is mild . No evidence of tricuspid stenosis.  Aortic Valve: The aortic  valve is grossly normal. There is mild thickening of the aortic valve. Aortic valve regurgitation is trivial. No aortic stenosis is present.  Pulmonic Valve: The pulmonic valve was normal in structure. Pulmonic valve regurgitation is trivial. No evidence of pulmonic stenosis.  Aorta: The aortic root is normal in size and structure. Ascending aorta measurements are within normal limits for age when indexed to body surface area.  Venous: The inferior vena cava is normal in size with less than 50% respiratory variability, suggesting right atrial pressure of 8 mmHg.  IAS/Shunts: No atrial level shunt detected by color flow Doppler.   LEFT VENTRICLE PLAX  2D LVIDd:         5.64 cm LVIDs:         4.96 cm LV PW:         1.06 cm         3D Volume EF LV IVS:        0.92 cm         LV 3D EF:    Left LVOT diam:     2.10 cm                      ventricul LV SV:         41                           ar LV SV Index:   30                           ejection LVOT Area:     3.46 cm                     fraction by 3D volume is 33 %.  3D Volume EF: 3D EF:        33 % LV EDV:       161 ml LV ESV:       108 ml LV SV:        53 ml  RIGHT VENTRICLE RV S prime:     9.73 cm/s TAPSE (M-mode): 2.1 cm  LEFT ATRIUM             Index        RIGHT ATRIUM           Index LA diam:        3.10 cm 2.22 cm/m   RA Area:     15.10 cm LA Vol (A2C):   74.1 ml 53.13 ml/m  RA Volume:   37.90 ml  27.17 ml/m LA Vol (A4C):   75.7 ml 54.27 ml/m LA Biplane Vol: 78.9 ml 56.57 ml/m AORTIC VALVE LVOT Vmax:   69.67 cm/s LVOT Vmean:  43.833 cm/s LVOT VTI:    0.119 m  AORTA Ao Root diam: 3.90 cm Ao Asc diam:  3.40 cm  MR Peak grad:   86.9 mmHg    TRICUSPID VALVE MR Mean grad:   56.0 mmHg    TR Peak grad:   42.8 mmHg MR Vmax:        466.00 cm/s  TR Vmax:        327.00 cm/s MR Vmean:       356.0 cm/s MR PISA:        1.57 cm     SHUNTS MR PISA Radius: 0.50 cm      Systemic VTI:  0.12 m Systemic  Diam: 2.10 cm  Weston Brass MD Electronically signed by Weston Brass MD Signature Date/Time: 07/26/2023/10:35:15 AM    Final    MONITORS  LONG TERM MONITOR (3-14 DAYS) 09/20/2023  Narrative Patch Wear Time:  2 days and 22 hours (2025-03-07T15:29:08-498 to 2025-03-10T15:06:13-0400)  Predominant rhythm:   Sinus 71 to 108 bpm  Average HR 94 bpm 46 episodes of SVT, fastest for 7 beats at 231 bpm, longest for 12.7 seconds at 160 bpm Occasional PACs (2.4% total) Rare PVCs  No diary entries     CARDIAC MRI  MR CARDIAC MORPHOLOGY W WO CONTRAST 09/16/2006  Narrative CARDIAC MRI: Ms. Riendeau is a 88 year old patient of Dr. Posey Rea and myself. She has  a history of PACs and palpitations. She had an echocardiogram suggesting previous inferior wall MI with an EF of 50-55%. This study was done to reassess LV function and rule out scar tissue and coronary disease. Protocol: The patient was scanned on the 1.5 Tesla GE magnet. A dedicated cardiac coil was used. Functional imaging was performed using Fiesta sequences. True short axis imaging from base to apex was done for quantification of ejection fraction. Two, three, and four chamber views were also done to assess for regional wall motion abnormalities. The patient received 0.2 mg/kg of gadolinium. A total of 35 cc of Magnevist was given. After 10 minutes hyperenhancement imaging using inversion recovery sequence was performed. Findings: The left ventricular cavity size is mildly dilated. There was no significant left ventricular hypertrophy. There was some abnormal septal motion and dysenergy to the LV motion. The quantitative ejection fraction was 50% and there was no thinning of the myocardium and no evidence of a discrete wall motion abnormality. There was mild left atrial enlargement. Right-sided cardiac chambers were normal. There was no ASD and no VSD. The aortic valve appeared trileaflet. There was probable mild mitral insufficiency and mild aortic insufficiency. Hyperenhancement imaging showed no evidence of scar tissue.  Impression 1. Global hypokinesis with septal dysenergy likely related to a form of nonischemic cardiomyopathy or related to the patient's frequent PACS. Gated ejection fraction is 50%. 2. No evidence of hyperenhancement or scar tissue. No evidence of previous myocardial infarction. 3. Probable mild aortic insufficiency. Suggest echo correlation. 4. Mild left atrial enlargement. The patient tolerated the procedure well. To be billed by [facility] as [phone] cardiac MRI function and morphology and gadolinium. Done in conjunction with Dr. Bridgett Larsson from [facility].  Provider: Council Mechanic   ______________________________________________________________________________________________      08/01/2023: Magnesium 2.2 09/09/2023: B Natriuretic Peptide >4,500.0 09/18/2023: ALT 15; BUN 17; Creatinine, Ser 1.08; Hemoglobin 11.2; NT-Pro BNP 20,016; Platelets 217; Potassium 4.7; Sodium 140; TSH 1.580   STS RISK CALCULATOR: Pending  NHYA CLASS: 1     ASSESSMENT AND PLAN:   1. Severe mitral regurgitation   2. Chronic systolic heart failure (HCC)   3. Atherosclerosis of native coronary artery of native heart without angina pectoris   4. Hyperlipidemia LDL goal <70   5. Aortic atherosclerosis (HCC)   6. Stage 3a chronic kidney disease (HCC)     Severe mitral regurgitation: The patient has developed a nonischemic cardiomyopathy and severe functional mitral regurgitation.  The patient feels fairly well.  Given her advanced age and frailty I think she is not a candidate for mitral transcatheter edge-to-edge repair and on my conversation with the patient she seems somewhat reluctant.  I think we should continue medical therapy.  I discussed this with the patient and they  are in agreement. Chronic systolic heart failure: Patient is unable to tolerate escalating doses of medication due to low blood pressures.  She is currently on spironolactone 12.5 mg daily.  * Mild coronary artery disease: Continue aspirin, probably reasonable to defer statin in this advanced age individual.. Hyperlipidemia: Will defer statin at this time in this advanced age individual. Aortic atherosclerosis: Continue aspirin 81 mg and defer statin as above. CKD stage IIIa: No room for ARB at this time.  I have personally reviewed the patients imaging data as summarized above.  I have reviewed the natural history of mitral regurgitation with the patient and family members who are present today. We have discussed the limitations of medical therapy and the poor prognosis associated  with symptomatic mitral regurgitation. We have also reviewed potential treatment options, including palliative medical therapy, conventional mitral surgery, and transcatheter mitral edge-to-edge repair. We discussed treatment options in the context of this patient's specific comorbid medical conditions.   All of the patient's questions were answered today. Will make further recommendations based on the results of studies outlined above.     Orbie Pyo, MD  10/04/2023 2:29 PM    [facility] [address] [place], [place], [place]  [address] Phone: [phone]; Fax: [phone]

## 2023-10-04 ENCOUNTER — Ambulatory Visit: Attending: Internal Medicine | Admitting: Internal Medicine

## 2023-10-04 ENCOUNTER — Encounter: Payer: Self-pay | Admitting: Internal Medicine

## 2023-10-04 VITALS — BP 100/64 | HR 90 | Ht 61.0 in | Wt 85.0 lb

## 2023-10-04 DIAGNOSIS — I2510 Atherosclerotic heart disease of native coronary artery without angina pectoris: Secondary | ICD-10-CM

## 2023-10-04 DIAGNOSIS — N1831 Chronic kidney disease, stage 3a: Secondary | ICD-10-CM

## 2023-10-04 DIAGNOSIS — I340 Nonrheumatic mitral (valve) insufficiency: Secondary | ICD-10-CM

## 2023-10-04 DIAGNOSIS — E785 Hyperlipidemia, unspecified: Secondary | ICD-10-CM | POA: Diagnosis not present

## 2023-10-04 DIAGNOSIS — I5022 Chronic systolic (congestive) heart failure: Secondary | ICD-10-CM | POA: Diagnosis not present

## 2023-10-04 DIAGNOSIS — I700 Atherosclerosis of aorta: Secondary | ICD-10-CM

## 2023-10-04 NOTE — Patient Instructions (Signed)
 Medication Instructions:  No changes *If you need a refill on your cardiac medications before your next appointment, please call your pharmacy*  Lab Work: none If you have labs (blood work) drawn today and your tests are completely normal, you will receive your results only by: MyChart Message (if you have MyChart) OR A paper copy in the mail If you have any lab test that is abnormal or we need to change your treatment, we will call you to review the results.  Testing/Procedures: none  Follow-Up: At [facility], you and your health needs are our priority.  As part of our continuing mission to provide you with exceptional heart care, our providers are all part of one team.  This team includes your primary Cardiologist (physician) and Advanced Practice Providers or APPs (Physician Assistants and Nurse Practitioners) who all work together to provide you with the care you need, when you need it.  Your next appointment:   3 month(s)  Provider:   Dietrich Pates, MD     We recommend signing up for the patient portal called "MyChart".  Sign up information is provided on this After Visit Summary.  MyChart is used to connect with patients for Virtual Visits (Telemedicine).  Patients are able to view lab/test results, encounter notes, upcoming appointments, etc.  Non-urgent messages can be sent to your provider as well.   To learn more about what you can do with MyChart, go to [email or web address].        1st Floor: - Lobby - Registration  - Pharmacy  - Lab - Cafe  2nd Floor: - PV Lab - Diagnostic Testing (echo, CT, nuclear med)  3rd Floor: - Vacant  4th Floor: - TCTS (cardiothoracic surgery) - AFib Clinic - Structural Heart Clinic - Vascular Surgery  - Vascular Ultrasound  5th Floor: - HeartCare Cardiology (general and EP) - Clinical Pharmacy for coumadin, hypertension, lipid, weight-loss medications, and med management appointments    Valet parking services  will be available as well.

## 2023-10-08 ENCOUNTER — Other Ambulatory Visit: Payer: Self-pay

## 2023-10-08 MED ORDER — METOPROLOL SUCCINATE ER 25 MG PO TB24: 12.5000 mg | ORAL_TABLET | Freq: Every day | ORAL | 3 refills | Status: DC

## 2023-10-09 ENCOUNTER — Ambulatory Visit: Payer: Self-pay

## 2023-10-09 NOTE — Patient Instructions (Signed)
 Visit Information  Thank you for taking time to visit with me today. Please don't hesitate to contact me if I can be of assistance to you.   Following are the goals we discussed today:  Continue to take medications as prescribed. Continue to attend provider visits as scheduled Continue to eat healthy, lean meats, vegetables, fruits, avoid saturated and transfats Contact provider with health questions or concerns as needed Continue to check blood pressure routinely and contact provider if dizziness, increased weakness, fatigue or any health questions or concerns Continue to weigh self daily and notify provider with any questions or concerns  Our next appointment is by telephone on 11/01/23 at 11:00 am  Please call the care guide team at [phone] if you need to cancel or reschedule your appointment.   If you are experiencing a Mental Health or Behavioral Health Crisis or need someone to talk to, please call the Suicide and Crisis Lifeline: 988 call the [place] National Suicide Prevention Lifeline: 862-456-7521 or TTY: (709)467-9414 TTY 4384448738) to talk to a trained counselor   Kathyrn Sheriff, RN, MSN, BSN, CCM [place]  [facility], Population Health Case Manager Phone: [phone]

## 2023-10-09 NOTE — Patient Outreach (Signed)
 Care Coordination   Follow Up Visit Note   10/09/2023 Name: Cindy Meza MRN: [id number] DOB: 07-28-1935  Cindy Meza is a 88 y.o. year old female who sees [name], [name], MD for primary care. I spoke with  Cindy Meza by phone today.  What matters to the patients health and wellness today?  Cindy Meza has followed up with PCP on 09/25/23 and Cardiology on 10/04/23. Per review of chart Metoprolol 12.5 mg daily ordered on yesterday. Patient reports she is going to pick up the medication from her pharmacy. Cindy Meza continues to weigh daily and monitor blood pressure. She reports her weight has been ranging between 85-89 lbs. She denies any edema. Cindy Meza is expressing no concerns at this time.  Goals Addressed             This Visit's Progress    Manintain and/or improve health       Interventions Today    Flowsheet Row Most Recent Value  Chronic Disease   Chronic disease during today's visit Congestive Heart Failure (CHF), Hypertension (HTN)  General Interventions   General Interventions Discussed/Reviewed General Interventions Reviewed, Doctor Visits  [Evaluation of current treatment plan for health condition and patient's adherence to plan.]  Doctor Visits Discussed/Reviewed PCP, Doctor Visits Reviewed, Specialist  PCP/Specialist Visits Compliance with follow-up visit  [reveiwed upcoming follow up appointments]  Education Interventions   Education Provided Provided Education  [reviewed patient instructions per PCP visit on 09/25/23 and cardilogy visit on 10/04/23.]  Provided Verbal Education On Nutrition, Medication, When to see the doctor  [advised to take medications as prescribed, attend provider visits as recommended, contact provider with health questions or concerns as needed.]  Nutrition Interventions   Nutrition Discussed/Reviewed Nutrition Discussed, Supplemental nutrition  Pharmacy Interventions   Pharmacy Dicussed/Reviewed Pharmacy Topics  Reviewed  [medications reviewed. reiterated new medication Metoprolol 25mg  prescribed to take 1/2 tablet daily. encouraged to take BP and contact cardiologist if outside recommended range, any dizziness, increased weakness, fatigue or any questions or concerns.]  Safety Interventions   Safety Discussed/Reviewed Safety Discussed            SDOH assessments and interventions completed:  No  Care Coordination Interventions:  Yes, provided   Follow up plan: Follow up call scheduled for 10/30/23    Encounter Outcome:  Patient Visit Completed   Kathyrn Sheriff, RN, MSN, BSN, CCM [place]  [facility], Population Health Case Manager Phone: [phone]

## 2023-10-28 ENCOUNTER — Encounter

## 2023-10-28 ENCOUNTER — Telehealth: Payer: Self-pay | Admitting: Internal Medicine

## 2023-10-28 NOTE — Telephone Encounter (Signed)
 Copied from CRM [id number]. Topic: Clinical - Medication Question >> Oct 28, 2023  3:39 PM Alyse July wrote: Reason for CRM: patient would like to know if she should continue taking metoprolol  succinate (TOPROL  XL) 25 MG 24 hr tablet. Patient also states she feels like she is going to faint and is feeling nausea as a result of taking spironolactone  (ALDACTONE ) 25 MG tablet. Per patient both are new medication and see wants a call back with clarification/feedback on this.

## 2023-10-30 ENCOUNTER — Ambulatory Visit: Payer: Self-pay

## 2023-10-30 ENCOUNTER — Other Ambulatory Visit: Payer: Self-pay

## 2023-10-30 NOTE — Patient Instructions (Signed)
 Visit Information  Thank you for taking time to visit with me today. Please don't hesitate to contact me if I can be of assistance to you before our next scheduled appointment.  Our next appointment is by telephone on 12/04/23 at 11:30 am Please call the care guide team at [phone] if you need to cancel or reschedule your appointment.   Following is a copy of your care plan:   Goals Addressed             This Visit's Progress    VBCI RN Care Plan       Problems:  Chronic Disease Management support and education needs related to CHF  Goal: Over the next 90 days the Patient will continue to work with RN Care Manager and/or Social Worker to address care management and care coordination needs related to CHF as evidenced by adherence to care management team scheduled appointments     demonstrate Ongoing adherence to prescribed treatment plan for CHF as evidenced by patient report or review of chart demonstrate Ongoing health management independence as evidenced by patient report or review of chart        take all medications exactly as prescribed and will call provider for medication related questions as evidenced by patient report or review of chart     Interventions:   Heart Failure Interventions: Assessed need for readable accurate scales in home Discussed importance of daily weight and advised patient to weigh and record daily Discussed the importance of keeping all appointments with provider Advised patient to discuss any health questions or concerns with provider  Patient Self-Care Activities:  Attend all scheduled provider appointments Call provider office for new concerns or questions  Take medications as prescribed   call office if I gain more than 2 pounds in one day or 5 pounds in one week watch for swelling in feet, ankles and legs every day weigh myself daily follow rescue plan if symptoms flare-up  Plan:  Telephone follow up appointment with care management team  member scheduled for:  12/04/23 at 11:30 am RNCM will update primary care and cardiologist regarding patient report of medication side effect.        Please call the Suicide and Crisis Lifeline: 988 call the [place]  National Suicide Prevention Lifeline: 867 055 9698 or TTY: 831-102-5283 TTY 334 489 4601) to talk to a trained counselor if you are experiencing a Mental Health or Behavioral Health Crisis or need someone to talk to.  The patient verbalized understanding of instructions, educational materials, and care plan provided today and DECLINED offer to receive copy of patient instructions, educational materials, and care plan.   Lindi Revering, RN, MSN, BSN, CCM [place]  [facility], Population Health Case Manager Phone: [phone]

## 2023-10-30 NOTE — Patient Outreach (Signed)
 Complex Care Management   Visit Note  10/30/2023  Name:  Cindy Meza MRN: [id number] DOB: 06/26/1936  Situation: Referral received for Complex Care Management related to Heart Failure I obtained verbal consent from Patient.  Visit completed with patient  on the phone  Background:   Past Medical History:  Diagnosis Date   Abdominal pain 03/30/2015   Dr Willy Harvest     /16, 1/21 ?fosamax related  S/p gyn eval, abd CT (-)  11/16 50% better  Better off Fosamax, off Miralax    12/22 Recurrent abd pain - pt saw Dr Willy Harvest; CT w/constipation - using Miralax  and Benefiber - better now     Allergy    Anemia    Asthma    Dr. Waymond Hailey   Blood transfusion without reported diagnosis    Breast cyst    Left   Cataract    Dr. Gennie Kicks   Cerumen impaction 04/06/2016   9/17 L, 10/19 B     Chest pressure 07/22/2012   1/14, 8/17  Recurrent - ?asthma related (11/17)  Advair MDI - use qd, not prn  We added Singulair   Cardiac w/up was (-) - --- Dr Avanell Bob     Closed right hip fracture (HCC) 06/12/2016   Dr Deeann Fare  R hip fx - intermedullary nail 06/13/17 s/p   On Prolia , calcium , Vit D        Complication of anesthesia    ' I HAD HALLUCINATIONS WHEN THEY DID MY HIP "   GERD (gastroesophageal reflux disease)    Heart murmur    Hemorrhoids    History of colonic polyps Gessner   hx of adenomas (small) 1998   Hot flash, menopausal 02/15/2012   Ongoing   D/c estrogens due to breast abnormalities 2013     Nausea 02/12/2019   8/20 d/c Midodrin, Florinef   Occasional.   Nausea after taking Pantoprazole  - take w/food     OA (osteoarthritis)    Orthostatic hypotension    Osteopenia    PAC (premature atrial contraction)    Upper respiratory infection 06/13/2023   Zpac if worse     Vitamin D  deficiency     Assessment: Patient Reported Symptoms:  Cognitive Cognitive Status: Alert and oriented to person, place, and time, Insightful and able to interpret abstract concepts      Neurological Neurological  Review of Symptoms: No symptoms reported    HEENT HEENT Symptoms Reported: No symptoms reported      Cardiovascular Cardiovascular Symptoms Reported: Other: Other Cardiovascular Symptoms: patient expressess feeling "a little off balance" since starting new medications metoprolol  and spironolactone  Does patient have uncontrolled Hypertension?: No Is patient checking Blood Pressure at home?: Yes Patient's Recent BP reading at home: 115/61 today and 117/72 on 10/29/23, weight today 87lb. Cardiovascular Conditions: Hypertension, Heart failure, Valvular disease (mitral valvular disease) Cardiovascular Management Strategies: Medication therapy, Weight management, Routine screening Cardiovascular Self-Management Outcome: 4 (good)  Respiratory Respiratory Symptoms Reported: No symptoms reported    Endocrine Patient reports the following symptoms related to hypoglycemia or hyperglycemia : No symptoms reported    Gastrointestinal Gastrointestinal Symptoms Reported: Nausea Additional Gastrointestinal Details: patient reports, " a little nausea since starting new medications". per review of chart patient has contacted primary care provider to notify.      Genitourinary Genitourinary Symptoms Reported: No symptoms reported    Integumentary Integumentary Symptoms Reported: No symptoms reported    Musculoskeletal Musculoskelatal Symptoms Reviewed: No symptoms reported        Psychosocial Psychosocial Symptoms Reported:  No symptoms reported     Quality of Family Relationships: supportive Do you feel physically threatened by others?: No      08/28/2023    3:13 PM  Depression screen PHQ 2/9  Decreased Interest 0  Down, Depressed, Hopeless 3  PHQ - 2 Score 3  Altered sleeping 1  Tired, decreased energy 0  Change in appetite 0  Feeling bad or failure about yourself  0  Trouble concentrating 0  Moving slowly or fidgety/restless 0  Suicidal thoughts 0  PHQ-9 Score 4  Difficult doing  work/chores Not difficult at all    Vitals:   10/29/23 1214 10/30/23 1214  BP: 117/72 115/61    Medications Reviewed Today     Reviewed by Cindy Simkin M, RN (Registered Nurse) on 10/30/23 at 1156  Med List Status: <None>   Medication Order Taking? Sig Documenting Provider Last Dose Status Informant  albuterol  (PROVENTIL  HFA) 108 (90 Base) MCG/ACT inhaler [id number] Yes Inhale 1-2 puffs into the lungs every 4 (four) hours as needed for wheezing or shortness of breath. [name], Aleksei V, MD Taking Active Self, Pharmacy Records  aspirin  81 MG tablet [id number] Yes Take 81 mg by mouth daily. [provider] Taking Active Self, Pharmacy Records  budesonide -formoterol  (SYMBICORT ) 80-4.5 MCG/ACT inhaler [id number] Yes Inhale 2 puffs into the lungs 2 (two) times daily. [name], Oakley Bellman, MD Taking Active Self, Pharmacy Records  calcium  carbonate (TUMS) 500 MG chewable tablet [id number] Yes Chew 1 tablet by mouth 3 (three) times daily. [provider] Taking Active Self  Calcium -Magnesium-Vitamin D  (CALCIUM  500 PO) [id number] No Take 1 tablet by mouth 2 (two) times daily with a meal. With lunch and supper  Patient not taking: Reported on 10/09/2023    Not Taking Active Self, Pharmacy Records  [employer] OMEGA-3 KRILL OIL 500 MG CAPS [id number] Yes Take 500 capsules by mouth daily. [provider] Taking Active Self, Pharmacy Records  Cyanocobalamin (VITAMIN B-12) 1000 MCG SUBL [id number] Yes Place 1 tablet (1,000 mcg total) under the tongue in the morning. Tory Freiberg, NP Taking Active Self, Pharmacy Records  estradiol  (ESTRACE ) 0.1 MG/GM vaginal cream [id number] Yes Place vaginally. [provider] Taking Active   furosemide  (LASIX ) 20 MG tablet [id number] Yes Take 1 tablet (20 mg total) by mouth daily as needed for edema (for weight gain 2 to 3 lbs in 24 hrs or 5 lbs in 7 days.). Arrien, Curlee Doss, MD Taking Active   LORazepam  (ATIVAN ) 0.5 MG tablet  [id number] No Take 0.5-1 tablets (0.25-0.5 mg total) by mouth at bedtime as needed for anxiety (insomnia, overactive bladder).  Patient not taking: Reported on 10/30/2023   PlotnikovOakley Bellman, MD Not Taking Active Self, Pharmacy Records           Med Note Nanine Babcock, [facility] D   Tue Jul 30, 2023  1:55 PM) unknown  metoprolol  succinate (TOPROL  XL) 25 MG 24 hr tablet [id number] Yes Take 0.5 tablets (12.5 mg total) by mouth daily. Elmyra Haggard, MD Taking Active   pantoprazole  (PROTONIX ) 40 MG tablet [id number] Yes Take 1 tablet (40 mg total) by mouth daily. [name], Aleksei V, MD Taking Active Self, Pharmacy Records  polyethylene glycol (MIRALAX  / GLYCOLAX ) 17 g packet [id number] Yes Take 17 g by mouth daily. [provider] Taking Active Self, Pharmacy Records  Romosozumab -aqqg (EVENITY ) 105 MG/1. injection 210 mg [id number]   Shamleffer, Julian Obey, MD  Active            Med Note Errol Heaps,  Fredick Jarred   Tue Aug 13, 2023  1:07 PM) 08/13/23: Reports during [facility] 30-day program outreach call: she is no longer getting these injections "at this point" per endocrinologist instructions per 08/09/23 office visit  spironolactone  (ALDACTONE ) 25 MG tablet [id number] Yes Take 0.5 tablets (12.5 mg total) by mouth daily. [name], Aleksei V, MD Taking Active   Vitamin D -Vitamin K (VITAMIN K2-VITAMIN D3 PO) [id number] Yes Take 1 capsule by mouth daily. [provider] Taking Active Self  Wheat Dextrin (BENEFIBER DRINK MIX PO) [id number] Yes Take 1 Capful by mouth daily. [provider] Taking Active Self, Pharmacy Records          Recommendation:   PCP Follow-up as scheduled Patient to contact cardiology or Primary care if condition worsens or does not improve. Patient to continue to monitor BP and weights.   Follow Up Plan:   RNCM will update Primary care and cardiologist. Telephone follow up appointment date/time:  12/04/23  Lindi Revering, RN, MSN, BSN, CCM [place]   [facility], Population Health Case Manager Phone: [phone]

## 2023-10-31 ENCOUNTER — Telehealth: Payer: Self-pay

## 2023-10-31 NOTE — Telephone Encounter (Signed)
 Pt says that she started the metoprolol  last and will hold it for now and keep track of her BP... she will keep a log and call us  ion the next several days and let us  know how her BP is doing and how she is feeling.

## 2023-10-31 NOTE — Progress Notes (Signed)
 Find out which med was added last    ? Toprol    or spironolactone  I would stop the last agent and follow symptoms, follow BP

## 2023-10-31 NOTE — Telephone Encounter (Signed)
-----   Message from Ola Berger sent at 10/31/2023  9:18 AM EDT -----    ----- Message ----- From: Billie Budge, RN Sent: 10/30/2023   6:07 PM EDT To: Genia Kettering, MD; Elmyra Haggard, MD  Hi Dr. Georgia Kipper and Dr. Avanell Bob,  FYI: Patient reports she recently started new medications metoprolol  and spironolactone . She states she is having "some side effects" and reports "nausea and feeling a little off balance". She reports she continues to monitor BP and today BP 115/61 and yesterday 117/72. Weight today 87 lbs. I wanted to update you on her concerns. Thank you.  Lindi Revering, RN, MSN, BSN, CCM [place]  [facility], Population Health Case Manager Phone: [phone]

## 2023-11-04 NOTE — Telephone Encounter (Signed)
 Copied from CRM [id number]. Topic: Clinical - Prescription Issue >> Nov 04, 2023 11:39 AM Jethro Morrison wrote: Reason for CRM: PT WAS TURNING KIAYA (CMA) CALL, CALLED CAL SHE WAS WITH ANOTHER PT. ADV PT SHE WILL CALL HER BACK

## 2023-11-04 NOTE — Telephone Encounter (Signed)
 Reduce metoprolol  to 12.5 mg daily Reduce Aldactone  to 12.5 mg daily Thank you

## 2023-11-04 NOTE — Telephone Encounter (Signed)
LVM for patient return call 

## 2023-11-05 NOTE — Telephone Encounter (Signed)
 Spoke with the pt and was able to inform her of the following advice per PCP... Pt has stated understanding.  Pt has stated she was formed by her Cardiologist that she is to stay off of the metoprolol  to 12.5 mg daily for 2 weeks and give her a call to inform her on hoe she is feeling.

## 2023-11-14 NOTE — Telephone Encounter (Signed)
 Spoke with patient and she states she was supposed to give us  a call in two weeks after stopping metoprolol . She is still taking spironolactone . States she is still having side effects since stopping  the metoprolol .  Nausea, weak, she feels shaky and off balance.  She would like to know what will the next steps be because she no longer wants to continue taking spironolactone . BP today  128/65 HR 83.  Will forward to provider

## 2023-11-14 NOTE — Telephone Encounter (Signed)
 Called pt   She says she is taking spironolactone   She is not on metoprolol    Yesterday she had a good day  Today it has not been very good    She says she did not notice a differnce when she stopped metoprolol     I have asked her to resume the metoprolol    I have recomm that she keep following BP and HR

## 2023-11-14 NOTE — Telephone Encounter (Signed)
 Pt is requesting a callback from nurse Lorelee Roger regarding her advised to call and give updates since medication adjustments. Please advise

## 2023-11-18 ENCOUNTER — Other Ambulatory Visit: Payer: Self-pay

## 2023-11-18 MED ORDER — METOPROLOL TARTRATE 25 MG PO TABS: 12.5000 mg | ORAL_TABLET | Freq: Two times a day (BID) | ORAL | 3 refills | Status: DC

## 2023-11-22 ENCOUNTER — Ambulatory Visit: Admitting: Gastroenterology

## 2023-11-27 ENCOUNTER — Ambulatory Visit (INDEPENDENT_AMBULATORY_CARE_PROVIDER_SITE_OTHER): Admitting: Internal Medicine

## 2023-11-27 ENCOUNTER — Encounter: Payer: Self-pay | Admitting: Internal Medicine

## 2023-11-27 VITALS — BP 118/70 | HR 88 | Temp 98.6°F | Ht 61.0 in | Wt 87.0 lb

## 2023-11-27 DIAGNOSIS — R5383 Other fatigue: Secondary | ICD-10-CM | POA: Diagnosis not present

## 2023-11-27 DIAGNOSIS — D509 Iron deficiency anemia, unspecified: Secondary | ICD-10-CM

## 2023-11-27 DIAGNOSIS — J4520 Mild intermittent asthma, uncomplicated: Secondary | ICD-10-CM

## 2023-11-27 DIAGNOSIS — E43 Unspecified severe protein-calorie malnutrition: Secondary | ICD-10-CM

## 2023-11-27 DIAGNOSIS — R634 Abnormal weight loss: Secondary | ICD-10-CM

## 2023-11-27 DIAGNOSIS — I5023 Acute on chronic systolic (congestive) heart failure: Secondary | ICD-10-CM

## 2023-11-27 DIAGNOSIS — K219 Gastro-esophageal reflux disease without esophagitis: Secondary | ICD-10-CM

## 2023-11-27 LAB — CBC WITH DIFFERENTIAL/PLATELET
Basophils Absolute: 0 10*3/uL (ref 0.0–0.1)
Basophils Relative: 0.5 % (ref 0.0–3.0)
Eosinophils Absolute: 0.1 10*3/uL (ref 0.0–0.7)
Eosinophils Relative: 0.9 % (ref 0.0–5.0)
HCT: 35 % — ABNORMAL LOW (ref 36.0–46.0)
Hemoglobin: 11.7 g/dL — ABNORMAL LOW (ref 12.0–15.0)
Lymphocytes Relative: 37.3 % (ref 12.0–46.0)
Lymphs Abs: 2.3 10*3/uL (ref 0.7–4.0)
MCHC: 33.4 g/dL (ref 30.0–36.0)
MCV: 95.3 fl (ref 78.0–100.0)
Monocytes Absolute: 0.5 10*3/uL (ref 0.1–1.0)
Monocytes Relative: 7.6 % (ref 3.0–12.0)
Neutro Abs: 3.3 10*3/uL (ref 1.4–7.7)
Neutrophils Relative %: 53.7 % (ref 43.0–77.0)
Platelets: 193 10*3/uL (ref 150.0–400.0)
RBC: 3.67 Mil/uL — ABNORMAL LOW (ref 3.87–5.11)
RDW: 13.5 % (ref 11.5–15.5)
WBC: 6.1 10*3/uL (ref 4.0–10.5)

## 2023-11-27 LAB — COMPREHENSIVE METABOLIC PANEL WITH GFR
ALT: 18 U/L (ref 0–35)
AST: 25 U/L (ref 0–37)
Albumin: 4.2 g/dL (ref 3.5–5.2)
Alkaline Phosphatase: 36 U/L — ABNORMAL LOW (ref 39–117)
BUN: 25 mg/dL — ABNORMAL HIGH (ref 6–23)
CO2: 30 meq/L (ref 19–32)
Calcium: 10 mg/dL (ref 8.4–10.5)
Chloride: 101 meq/L (ref 96–112)
Creatinine, Ser: 1.19 mg/dL (ref 0.40–1.20)
GFR: 41.59 mL/min — ABNORMAL LOW (ref >60.00)
Glucose, Bld: 93 mg/dL (ref 70–99)
Potassium: 4.3 meq/L (ref 3.5–5.1)
Sodium: 137 meq/L (ref 135–145)
Total Bilirubin: 0.6 mg/dL (ref 0.2–1.2)
Total Protein: 7.4 g/dL (ref 6.0–8.3)

## 2023-11-27 MED ORDER — PANTOPRAZOLE SODIUM 20 MG PO TBEC: 20.0000 mg | DELAYED_RELEASE_TABLET | Freq: Every day | ORAL | 3 refills | Status: AC

## 2023-11-27 MED ORDER — SPIRONOLACTONE 25 MG PO TABS: 12.5000 mg | ORAL_TABLET | Freq: Every day | ORAL | 3 refills | Status: DC

## 2023-11-27 MED ORDER — METOPROLOL SUCCINATE ER 25 MG PO TB24: 12.5000 mg | ORAL_TABLET | Freq: Every evening | ORAL | 3 refills | Status: AC

## 2023-11-27 NOTE — Assessment & Plan Note (Signed)
 Change to Symbicort bid Advair is not covered

## 2023-11-27 NOTE — Assessment & Plan Note (Signed)
 Protein drink daily

## 2023-11-27 NOTE — Assessment & Plan Note (Signed)
 Pt wants to go to Protonix  20 mg/d

## 2023-11-27 NOTE — Assessment & Plan Note (Signed)
 Multifactorial.   Monitor labs

## 2023-11-27 NOTE — Assessment & Plan Note (Signed)
Monitoring CBC 

## 2023-11-27 NOTE — Patient Instructions (Signed)
 Take Metoprolol  XL 12.5 mg w/dinner, spironolactone  12.5 mg w/lunch

## 2023-11-27 NOTE — Progress Notes (Signed)
 Subjective:  Patient ID: Cindy Meza, female    DOB: [birth year]  Age: 88 y.o. MRN: [id number]  CC: Medical Management of Chronic Issues (2 MNTH F/U)   HPI Cindy Meza presents for weakness, wt loss, nausea Nausea start after taking her morning pills (not metoprolol )  Outpatient Medications Prior to Visit  Medication Sig Dispense Refill   albuterol  (PROVENTIL  HFA) 108 (90 Base) MCG/ACT inhaler Inhale 1-2 puffs into the lungs every 4 (four) hours as needed for wheezing or shortness of breath. 8.5 g 5   aspirin  81 MG tablet Take 81 mg by mouth daily.     budesonide -formoterol  (SYMBICORT ) 80-4.5 MCG/ACT inhaler Inhale 2 puffs into the lungs 2 (two) times daily. 1 each 11   calcium  carbonate (TUMS) 500 MG chewable tablet Chew 1 tablet by mouth 3 (three) times daily.     [employer] OMEGA-3 KRILL OIL 500 MG CAPS Take 500 capsules by mouth daily.     Cyanocobalamin (VITAMIN B-12) 1000 MCG SUBL Place 1 tablet (1,000 mcg total) under the tongue in the morning. 100 tablet 3   estradiol  (ESTRACE ) 0.1 MG/GM vaginal cream Place vaginally.     furosemide  (LASIX ) 20 MG tablet Take 1 tablet (20 mg total) by mouth daily as needed for edema (for weight gain 2 to 3 lbs in 24 hrs or 5 lbs in 7 days.). 30 tablet 0   polyethylene glycol (MIRALAX  / GLYCOLAX ) 17 g packet Take 17 g by mouth daily.     Vitamin D -Vitamin K (VITAMIN K2-VITAMIN D3 PO) Take 1 capsule by mouth daily.     Wheat Dextrin (BENEFIBER DRINK MIX PO) Take 1 Capful by mouth daily.     metoprolol  tartrate (LOPRESSOR ) 25 MG tablet Take 0.5 tablets (12.5 mg total) by mouth 2 (two) times daily. 90 tablet 3   pantoprazole  (PROTONIX ) 40 MG tablet Take 1 tablet (40 mg total) by mouth daily. 30 tablet 11   spironolactone  (ALDACTONE ) 25 MG tablet Take 0.5 tablets (12.5 mg total) by mouth daily. 45 tablet 3   Calcium -Magnesium-Vitamin D  (CALCIUM  500 PO) Take 1 tablet by mouth 2 (two) times daily with a meal. With lunch and supper (Patient not  taking: Reported on 10/09/2023)     LORazepam  (ATIVAN ) 0.5 MG tablet Take 0.5-1 tablets (0.25-0.5 mg total) by mouth at bedtime as needed for anxiety (insomnia, overactive bladder). (Patient not taking: Reported on 10/30/2023) 30 tablet 2   Facility-Administered Medications Prior to Visit  Medication Dose Route Frequency Provider Last Rate Last Admin   Romosozumab -aqqg (EVENITY ) 105 MG/1. injection 210 mg  210 mg Subcutaneous Once Shamleffer, Ibtehal Jaralla, MD        ROS: Review of Systems  Constitutional:  Positive for fatigue. Negative for activity change, appetite change, chills and unexpected weight change.  HENT:  Negative for congestion, mouth sores and sinus pressure.   Eyes:  Negative for visual disturbance.  Respiratory:  Negative for cough and chest tightness.   Gastrointestinal:  Positive for nausea. Negative for abdominal pain.  Genitourinary:  Negative for difficulty urinating, frequency and vaginal pain.  Musculoskeletal:  Positive for back pain and gait problem.  Skin:  Negative for pallor and rash.  Neurological:  Negative for dizziness, tremors, weakness, numbness and headaches.  Psychiatric/Behavioral:  Positive for decreased concentration. Negative for confusion, sleep disturbance and suicidal ideas. The patient is nervous/anxious.     Objective:  BP 118/70   Pulse 88   Temp 98.6 F (37 C) (Oral)   Ht 5'  1" (1.549 m)   Wt 87 lb (39.5 kg)   SpO2 99%   BMI 16.44 kg/m   BP Readings from Last 3 Encounters:  11/27/23 118/70  10/30/23 115/61  10/04/23 100/64    Wt Readings from Last 3 Encounters:  11/27/23 87 lb (39.5 kg)  10/04/23 85 lb (38.6 kg)  09/25/23 89 lb (40.4 kg)    Physical Exam Constitutional:      General: She is not in acute distress.    Appearance: She is well-developed. She is obese.  HENT:     Head: Normocephalic.     Right Ear: External ear normal.     Left Ear: External ear normal.     Nose: Nose normal.  Eyes:     General:         Right eye: No discharge.        Left eye: No discharge.     Conjunctiva/sclera: Conjunctivae normal.     Pupils: Pupils are equal, round, and reactive to light.  Neck:     Thyroid : No thyromegaly.     Vascular: No JVD.     Trachea: No tracheal deviation.  Cardiovascular:     Rate and Rhythm: Normal rate and regular rhythm.     Heart sounds: Normal heart sounds.  Pulmonary:     Effort: No respiratory distress.     Breath sounds: No stridor. No wheezing.  Abdominal:     General: Bowel sounds are normal. There is no distension.     Palpations: Abdomen is soft. There is no mass.     Tenderness: There is no abdominal tenderness. There is no guarding or rebound.  Musculoskeletal:        General: No tenderness.     Cervical back: Normal range of motion and neck supple. No rigidity.  Lymphadenopathy:     Cervical: No cervical adenopathy.  Skin:    Findings: No erythema or rash.  Neurological:     Cranial Nerves: No cranial nerve deficit.     Motor: No abnormal muscle tone.     Coordination: Coordination normal.     Deep Tendon Reflexes: Reflexes normal.  Psychiatric:        Behavior: Behavior normal.        Thought Content: Thought content normal.        Judgment: Judgment normal.   thin  Lab Results  Component Value Date   WBC 4.9 09/18/2023   HGB 11.2 09/18/2023   HCT 35.3 09/18/2023   PLT 217 09/18/2023   GLUCOSE 95 09/18/2023   CHOL 227 (H) 07/12/2021   TRIG 45 07/12/2021   HDL 107 07/12/2021   LDLDIRECT 104.0 03/17/2013   LDLCALC 112 (H) 07/12/2021   ALT 15 09/18/2023   AST 23 09/18/2023   NA 140 09/18/2023   K 4.7 09/18/2023   CL 105 09/18/2023   CREATININE 1.08 (H) 09/18/2023   BUN 17 09/18/2023   CO2 21 09/18/2023   TSH 1.580 09/18/2023   INR 1.04 06/12/2016   HGBA1C 5.0 12/27/2021    US  RENAL Result Date: 10/03/2023 CLINICAL DATA:  Stage 3 chronic kidney disease. EXAM: RENAL / URINARY TRACT ULTRASOUND COMPLETE COMPARISON:  CT abdomen pelvis  May 23, 2021 FINDINGS: Right Kidney: Renal measurements: 10 x 3.9 x 4.8 cm = volume: 98.9 mL. Echogenicity within normal limits. No mass or hydronephrosis visualized. Left Kidney: Renal measurements: 9.2 x 3.9 x 4 cm = volume: 75.8 mL. Echogenicity within normal limits. No mass or hydronephrosis visualized. Bladder:  Appears normal for degree of bladder distention. Other: None. IMPRESSION: Normal renal ultrasound. Electronically Signed   By: Anna Barnes M.D.   On: 10/03/2023 14:25    Assessment & Plan:   Problem List Items Addressed This Visit     RESOLVED: Anemia   Monitoring CBC      Asthma   Change to Symbicort  bid Advair is not covered      GERD (gastroesophageal reflux disease)   Pt wants to go to Protonix  20 mg/d      Relevant Medications   pantoprazole  (PROTONIX ) 20 MG tablet   Fatigue - Primary   Multifactorial Monitor labs      Weight loss   Wt Readings from Last 3 Encounters:  11/27/23 87 lb (39.5 kg)  10/04/23 85 lb (38.6 kg)  09/25/23 89 lb (40.4 kg)         Relevant Orders   CBC with Differential/Platelet   Comprehensive metabolic panel with GFR   Acute on chronic systolic CHF (congestive heart failure) (HCC)   Take Metoprolol  XL 12.5 mg w/dinner, spironolactone  12.5 mg w/lunch      Relevant Medications   metoprolol  succinate (TOPROL -XL) 25 MG 24 hr tablet   spironolactone  (ALDACTONE ) 25 MG tablet   Other Relevant Orders   CBC with Differential/Platelet   Comprehensive metabolic panel with GFR   Protein-calorie malnutrition, severe   Protein drink daily         Meds ordered this encounter  Medications   metoprolol  succinate (TOPROL -XL) 25 MG 24 hr tablet    Sig: Take 0.5 tablets (12.5 mg total) by mouth at bedtime.    Dispense:  45 tablet    Refill:  3   spironolactone  (ALDACTONE ) 25 MG tablet    Sig: Take 0.5 tablets (12.5 mg total) by mouth daily. Take with lunch    Dispense:  45 tablet    Refill:  3   pantoprazole  (PROTONIX ) 20  MG tablet    Sig: Take 1 tablet (20 mg total) by mouth daily.    Dispense:  90 tablet    Refill:  3      Follow-up: Return in about 2 months (around 01/27/2024) for a follow-up visit.  Anitra Barn, MD

## 2023-11-27 NOTE — Assessment & Plan Note (Signed)
 Wt Readings from Last 3 Encounters:  11/27/23 87 lb (39.5 kg)  10/04/23 85 lb (38.6 kg)  09/25/23 89 lb (40.4 kg)

## 2023-11-27 NOTE — Assessment & Plan Note (Signed)
 Take Metoprolol  XL 12.5 mg w/dinner, spironolactone  12.5 mg w/lunch

## 2023-11-29 ENCOUNTER — Ambulatory Visit: Payer: Self-pay | Admitting: Internal Medicine

## 2023-12-04 ENCOUNTER — Other Ambulatory Visit: Payer: Self-pay

## 2023-12-04 NOTE — Patient Outreach (Signed)
 Complex Care Management   Visit Note  12/04/2023  Name:  Cindy Meza MRN: [id number] DOB: 1936-06-16  Situation: Referral received for Complex Care Management related to Heart Failure I obtained verbal consent from Patient.  Visit completed with patient  on the phone  Background:   Past Medical History:  Diagnosis Date   Abdominal pain 03/30/2015   Dr Willy Harvest     /16, 1/21 ?fosamax related  S/p gyn eval, abd CT (-)  11/16 50% better  Better off Fosamax, off Miralax    12/22 Recurrent abd pain - pt saw Dr Willy Harvest; CT w/constipation - using Miralax  and Benefiber - better now     Allergy    Anemia    Asthma    Dr. Waymond Hailey   Blood transfusion without reported diagnosis    Breast cyst    Left   Cataract    Dr. Gennie Kicks   Cerumen impaction 04/06/2016   9/17 L, 10/19 B     Chest pressure 07/22/2012   1/14, 8/17  Recurrent - ?asthma related (11/17)  Advair MDI - use qd, not prn  We added Singulair   Cardiac w/up was (-) - --- Dr Avanell Bob     Closed right hip fracture (HCC) 06/12/2016   Dr Deeann Fare  R hip fx - intermedullary nail 06/13/17 s/p   On Prolia , calcium , Vit D        Complication of anesthesia    ' I HAD HALLUCINATIONS WHEN THEY DID MY HIP "   GERD (gastroesophageal reflux disease)    Heart murmur    Hemorrhoids    History of colonic polyps Gessner   hx of adenomas (small) 1998   Hot flash, menopausal 02/15/2012   Ongoing   D/c estrogens due to breast abnormalities 2013     Nausea 02/12/2019   8/20 d/c Midodrin, Florinef   Occasional.   Nausea after taking Pantoprazole  - take w/food     OA (osteoarthritis)    Orthostatic hypotension    Osteopenia    PAC (premature atrial contraction)    Upper respiratory infection 06/13/2023   Zpac if worse     Vitamin D  deficiency     Assessment: patient reports she is doing better. She denies any signs/symptoms of HF exacerbation at this time. She reports she is monitoring her weights- would like to gain some weight and not loose any  more weight.  Patient Reported Symptoms: Cognitive Cognitive Status: Alert and oriented to person, place, and time, Insightful and able to interpret abstract concepts, Normal speech and language skills      Neurological Neurological Review of Symptoms: No symptoms reported    HEENT HEENT Symptoms Reported: No symptoms reported      Cardiovascular Cardiovascular Symptoms Reported: No symptoms reported Other Cardiovascular Symptoms: patient denies any signs/symptoms of worsening HF. she states she weighs and records weight daily. She reports weight today 84.5 lbs. she states her home weight on day of last PCP visit 11/27/23) was 85.5 lbs. Weight: 84 lb 8 oz (38.3 kg)  Respiratory Respiratory Symptoms Reported: No symptoms reported    Endocrine Patient reports the following symptoms related to hypoglycemia or hyperglycemia : No symptoms reported Is patient diabetic?: No    Gastrointestinal Gastrointestinal Symptoms Reported: Nausea Additional Gastrointestinal Details: occasional nausea denies nausea at this time.      Genitourinary Genitourinary Symptoms Reported: No symptoms reported, Other Other Genitourinary Symptoms: Reports gets up several times during the night to urinate, but limits water at bedtime.    Integumentary Integumentary  Symptoms Reported: Other Other Integumentary Symptoms: dry skin recommend moisturizer    Musculoskeletal Musculoskelatal Symptoms Reviewed: No symptoms reported        Psychosocial Psychosocial Symptoms Reported: No symptoms reported            08/28/2023    3:13 PM  Depression screen PHQ 2/9  Decreased Interest 0  Down, Depressed, Hopeless 3  PHQ - 2 Score 3  Altered sleeping 1  Tired, decreased energy 0  Change in appetite 0  Feeling bad or failure about yourself  0  Trouble concentrating 0  Moving slowly or fidgety/restless 0  Suicidal thoughts 0  PHQ-9 Score 4  Difficult doing work/chores Not difficult at all    Vitals:    12/04/23 1147  BP: 132/80  Pulse: 81    Medications Reviewed Today     Reviewed by Ardell Aaronson M, RN (Registered Nurse) on 12/04/23 at 1227  Med List Status: <None>   Medication Order Taking? Sig Documenting Provider Last Dose Status Informant  albuterol  (PROVENTIL  HFA) 108 (90 Base) MCG/ACT inhaler [id number] Yes Inhale 1-2 puffs into the lungs every 4 (four) hours as needed for wheezing or shortness of breath. [name], Aleksei V, MD Taking Active Self, Pharmacy Records  aspirin  81 MG tablet [id number] Yes Take 81 mg by mouth daily. [provider] Taking Active Self, Pharmacy Records  budesonide -formoterol  (SYMBICORT ) 80-4.5 MCG/ACT inhaler [id number] Yes Inhale 2 puffs into the lungs 2 (two) times daily. [name], Aleksei V, MD Taking Active Self, Pharmacy Records  calcium  carbonate (TUMS) 500 MG chewable tablet [id number] Yes Chew 1 tablet by mouth 3 (three) times daily. [provider] Taking Active Self  Calcium -Magnesium-Vitamin D  (CALCIUM  500 PO) [id number] No Take 1 tablet by mouth 2 (two) times daily with a meal. With lunch and supper  Patient not taking: Reported on 10/09/2023    Not Taking Active Self, Pharmacy Records  [employer] OMEGA-3 KRILL OIL 500 MG CAPS [id number] Yes Take 500 capsules by mouth daily. [provider] Taking Active Self, Pharmacy Records  Cyanocobalamin (VITAMIN B-12) 1000 MCG SUBL [id number] Yes Place 1 tablet (1,000 mcg total) under the tongue in the morning. Tory Freiberg, NP Taking Active Self, Pharmacy Records  estradiol  (ESTRACE ) 0.1 MG/GM vaginal cream [id number] Yes Place vaginally. [provider] Taking Active            Med Note Lajuana Pilar, Jacky Massing   Wed Dec 04, 2023 11:38 AM) Reports uses twice a week as needed.  furosemide  (LASIX ) 20 MG tablet [id number] Yes Take 1 tablet (20 mg total) by mouth daily as needed for edema (for weight gain 2 to 3 lbs in 24 hrs or 5 lbs in 7 days.). Arrien, Curlee Doss, MD Taking  Active   LORazepam  (ATIVAN ) 0.5 MG tablet [id number] No Take 0.5-1 tablets (0.25-0.5 mg total) by mouth at bedtime as needed for anxiety (insomnia, overactive bladder).  Patient not taking: Reported on 10/30/2023   PlotnikovOakley Bellman, MD Not Taking Active Self, Pharmacy Records           Med Note Nanine Babcock, [facility] D   Tue Jul 30, 2023  1:55 PM) unknown  metoprolol  succinate (TOPROL -XL) 25 MG 24 hr tablet [id number] Yes Take 0.5 tablets (12.5 mg total) by mouth at bedtime. [name], Aleksei V, MD Taking Active   pantoprazole  (PROTONIX ) 20 MG tablet [id number] Yes Take 1 tablet (20 mg total) by mouth daily. [name], Aleksei V, MD Taking Active   polyethylene glycol (MIRALAX  / GLYCOLAX ) 17 g  packet [id number] Yes Take 17 g by mouth daily. [provider] Taking Active Self, Pharmacy Records  Romosozumab -aqqg (EVENITY ) 105 MG/1. injection 210 mg [id number]   Shamleffer, Julian Obey, MD  Active            Med Note (TOUSEY, LAINE M   Tue Aug 13, 2023  1:07 PM) 08/13/23: Reports during [facility] 30-day program outreach call: she is no longer getting these injections "at this point" per endocrinologist instructions per 08/09/23 office visit  spironolactone  (ALDACTONE ) 25 MG tablet [id number] Yes Take 0.5 tablets (12.5 mg total) by mouth daily. Take with lunch [name], Oakley Bellman, MD Taking Active   Vitamin D -Vitamin K (VITAMIN K2-VITAMIN D3 PO) [id number] Yes Take 1 capsule by mouth daily. [provider] Taking Active Self  Wheat Dextrin (BENEFIBER DRINK MIX PO) [id number] Yes Take 1 Capful by mouth daily. [provider] Taking Active Self, Pharmacy Records          Recommendation:   PCP Follow-up Specialty provider follow-up as scheduled  Follow Up Plan:   Telephone follow up appointment date/time:  01/01/24 at 11:30 am  Lindi Revering, RN, MSN, BSN, CCM   [facility], Population Health Case Manager Phone: [phone]

## 2023-12-04 NOTE — Patient Instructions (Signed)
 Visit Information  Thank you for taking time to visit with me today. Please don't hesitate to contact me if I can be of assistance to you before our next scheduled appointment.  Your next care management appointment is by telephone on 01/01/24 at 11;30 am  Please call the care guide team at [phone] if you need to cancel, schedule, or reschedule an appointment.   Please call the Suicide and Crisis Lifeline: 988 call the [place]  National Suicide Prevention Lifeline: (925)804-8057 or TTY: (220)471-1244 TTY 780-703-0703) to talk to a trained counselor if you are experiencing a Mental Health or Behavioral Health Crisis or need someone to talk to.  Lindi Revering, RN, MSN, BSN, CCM [place]  [facility], Population Health Case Manager Phone: [phone]

## 2023-12-09 NOTE — Telephone Encounter (Signed)
 Copied from CRM [id number]. Topic: Clinical - Lab/Test Results >> Dec 09, 2023 11:39 AM Magdalene [employer] wrote: Reason for CRM: Patient returning call for lab results. I relayed notes, patient expressed understanding and had no further questions.

## 2024-01-01 ENCOUNTER — Other Ambulatory Visit: Payer: Self-pay

## 2024-01-01 NOTE — Patient Outreach (Unsigned)
 Complex Care Management   Visit Note  01/02/2024  Name:  Cindy Meza MRN: [id number] DOB: 09-Mar-1936  Situation: Referral received for Complex Care Management related to Heart Failure I obtained verbal consent from Patient.  Visit completed with patient  on the phone  Background:   Past Medical History:  Diagnosis Date   Abdominal pain 03/30/2015   Dr Avram     /16, 1/21 ?fosamax related  S/p gyn eval, abd CT (-)  11/16 50% better  Better off Fosamax, off Miralax    12/22 Recurrent abd pain - pt saw Dr Avram; CT w/constipation - using Miralax  and Benefiber - better now     Allergy    Anemia    Asthma    Dr. Darlean   Blood transfusion without reported diagnosis    Breast cyst    Left   Cataract    Dr. Roz   Cerumen impaction 04/06/2016   9/17 L, 10/19 B     Chest pressure 07/22/2012   1/14, 8/17  Recurrent - ?asthma related (11/17)  Advair MDI - use qd, not prn  We added Singulair   Cardiac w/up was (-) - --- Dr Okey     Closed right hip fracture (HCC) 06/12/2016   Dr Dozier  R hip fx - intermedullary nail 06/13/17 s/p   On Prolia , calcium , Vit D        Complication of anesthesia    ' I HAD HALLUCINATIONS WHEN THEY DID MY HIP    GERD (gastroesophageal reflux disease)    Heart murmur    Hemorrhoids    History of colonic polyps Gessner   hx of adenomas (small) 1998   Hot flash, menopausal 02/15/2012   Ongoing   D/c estrogens due to breast abnormalities 2013     Nausea 02/12/2019   8/20 d/c Midodrin, Florinef   Occasional.   Nausea after taking Pantoprazole  - take w/food     OA (osteoarthritis)    Orthostatic hypotension    Osteopenia    PAC (premature atrial contraction)    Upper respiratory infection 06/13/2023   Zpac if worse     Vitamin D  deficiency     Assessment: Weight loss. Reports weight today(01/01/24) is 80.5 lb. weight last telephone assessment was 84 lb on 12/04/23. She states she always weighs in the morning before eating. See additional  information in GI section below.  Patient Reported Symptoms: Cognitive Cognitive Status: Alert and oriented to person, place, and time, Insightful and able to interpret abstract concepts, Normal speech and language skills      Neurological Neurological Review of Symptoms: No symptoms reported    HEENT HEENT Symptoms Reported: No symptoms reported      Cardiovascular Cardiovascular Symptoms Reported: No symptoms reported Weight: 80 lb 8 oz (36.5 kg) (patient home reading)  Respiratory Respiratory Symptoms Reported: No symptoms reported    Endocrine Patient reports the following symptoms related to hypoglycemia or hyperglycemia : No symptoms reported Is patient diabetic?: No    Gastrointestinal Gastrointestinal Symptoms Reported: Other Other Gastrointestinal Symptoms: Weight loss. Per patient no change in eating habits-appetite is good and states nausea is not a problem and does not intere with eating. Patient reports eating 3 meals a day plus snacks(fruit or peanut butter/crackers. She states she usually gets 1 can of Boost in every portioning to get a full can in a day. Additional Gastrointestinal Details: weighs daily: 01/01/24 80.5 lbs; 12/31/23 81.0 lb; 6/23 82 lb day before and 82. weight at last telephone assessment on  12/04/23 was 84.5 lb. reports typical meal: Breakfast: Cereal with raison, 2 prunes every day, shredded wheat or cherios/ Lunch-meat [place] breast deli, low sodium, lettuce tomato fruit, sometimes banana/ Dinner-chicken or chicken salad and eats belize routinely, usually drinks water with meals.      Genitourinary Genitourinary Symptoms Reported: No symptoms reported    Integumentary Integumentary Symptoms Reported: Skin changes Additional Integumentary Details: skin seems to be dry using moisturizer    Musculoskeletal Musculoskelatal Symptoms Reviewed: No symptoms reported        Psychosocial Psychosocial Symptoms Reported: Depression - if selected complete PHQ  2-9            01/01/2024   12:04 PM  Depression screen PHQ 2/9  Decreased Interest 0  Down, Depressed, Hopeless 0  PHQ - 2 Score 0    There were no vitals filed for this visit.  Medications Reviewed Today     Reviewed by Jaedan Huttner M, RN (Registered Nurse) on 01/01/24 at 1200  Med List Status: <None>   Medication Order Taking? Sig Documenting Provider Last Dose Status Informant  albuterol  (PROVENTIL  HFA) 108 (90 Base) MCG/ACT inhaler [id number] Yes Inhale 1-2 puffs into the lungs every 4 (four) hours as needed for wheezing or shortness of breath. [name], Aleksei V, MD  Active Self, Pharmacy Records  aspirin  81 MG tablet [id number] Yes Take 81 mg by mouth daily. [provider]  Active Self, Pharmacy Records  budesonide -formoterol  (SYMBICORT ) 80-4.5 MCG/ACT inhaler [id number] Yes Inhale 2 puffs into the lungs 2 (two) times daily. [name], Aleksei V, MD  Active Self, Pharmacy Records  calcium  carbonate (TUMS) 500 MG chewable tablet [id number] Yes Chew 1 tablet by mouth 3 (three) times daily. [provider]  Active Self  Calcium -Magnesium-Vitamin D  (CALCIUM  500 PO) [id number]  Take 1 tablet by mouth 2 (two) times daily with a meal. With lunch and supper  Patient not taking: Reported on 01/01/2024     Active Self, Pharmacy Records  [employer] OMEGA-3 KRILL OIL 500 MG CAPS [id number] Yes Take 500 capsules by mouth daily. [provider]  Active Self, Pharmacy Records  Cyanocobalamin (VITAMIN B-12) 1000 MCG SUBL [id number] Yes Place 1 tablet (1,000 mcg total) under the tongue in the morning. Cara [name], NP  Active Self, Pharmacy Records  estradiol  (ESTRACE ) 0.1 MG/GM vaginal cream [id number] Yes Place vaginally. [provider]  Active            Med Note [name], [name]   Wed Dec 04, 2023 11:38 AM) Reports uses twice a week as needed.  furosemide  (LASIX ) 20 MG tablet [id number] Yes Take 1 tablet (20 mg total) by mouth daily as needed for edema  (for weight gain 2 to 3 lbs in 24 hrs or 5 lbs in 7 days.). Arrien, Elidia Sieving, MD  Active   LORazepam  (ATIVAN ) 0.5 MG tablet [id number]  Take 0.5-1 tablets (0.25-0.5 mg total) by mouth at bedtime as needed for anxiety (insomnia, overactive bladder).  Patient not taking: Reported on 01/01/2024   [name], Aleksei V, MD  Active Self, Pharmacy Records           Med Note Allen, Manzanola D   Tue Jul 30, 2023  1:55 PM) unknown  metoprolol  succinate (TOPROL -XL) 25 MG 24 hr tablet [id number] Yes Take 0.5 tablets (12.5 mg total) by mouth at bedtime. [name], Aleksei V, MD  Active   pantoprazole  (PROTONIX ) 20 MG tablet [id number] Yes Take 1 tablet (20 mg total) by mouth daily. [name], Aleksei V, MD  Active   polyethylene glycol (MIRALAX  / GLYCOLAX ) 17 g packet [id number] Yes Take 17 g by mouth daily. [provider]  Active Self, Pharmacy Records  Romosozumab -aqqg (EVENITY ) 105 MG/1. injection 210 mg [id number]   Shamleffer, Donell Cardinal, MD  Active            Med Note (TOUSEY, LAINE M   Tue Aug 13, 2023  1:07 PM) 08/13/23: Reports during [facility] 30-day program outreach call: she is no longer getting these injections at this point per endocrinologist instructions per 08/09/23 office visit  spironolactone  (ALDACTONE ) 25 MG tablet [id number] Yes Take 0.5 tablets (12.5 mg total) by mouth daily. Take with lunch [name], Aleksei V, MD  Active   Vitamin D -Vitamin K (VITAMIN K2-VITAMIN D3 PO) [id number] Yes Take 1 capsule by mouth daily. [provider]  Active Self  Wheat Dextrin (BENEFIBER DRINK MIX PO) [id number] Yes Take 1 Capful by mouth daily. [provider]  Active Self, Pharmacy Records          Recommendation:   PCP Follow-up Specialty provider follow-up 01/22/24 Gastroenterology, cardiology 01/24/24 and primary care provider 02/05/24 Continue Current Plan of Care  Follow Up Plan:   Telephone follow up appointment date/time:  01/28/24 at 11:30  Heddy Shutter,  RN, MSN, BSN, CCM [employer], Population Health Case Manager Phone: [phone]

## 2024-01-02 NOTE — Patient Instructions (Addendum)
 Visit Information  Thank you for taking time to visit with me today. Please don't hesitate to contact me if I can be of assistance to you before our next scheduled appointment.  Our next appointment is by telephone on 01/28/24 at 11:30 am Please call the care guide team at [phone] if you need to cancel or reschedule your appointment.   I have included some educational material below: Protein in Different Foods  Please call the Suicide and Crisis Lifeline: 988 call the [place]  National Suicide Prevention Lifeline: (434)505-7709 or TTY: (772)324-7892 TTY (743)616-8834) to talk to a trained counselor if you are experiencing a Mental Health or Behavioral Health Crisis or need someone to talk to.  The patient verbalized understanding of instructions, educational materials, and care plan provided today and agreed to receive a mailed copy of patient instructions, educational materials, and care plan.   Heddy Shutter, RN, MSN, BSN, CCM [place]  [facility], Population Health Case Manager Phone: [phone]    Protein in Different Foods Protein is a nutrient that you get through your diet. Depending on your health, you may need more or less protein in your diet. You should eat a variety of protein foods to make sure that you get all the nutrients you need. Talk with your health care provider about how much protein you need each day. Protein helps your body: Fix and make cells and tissues. Fight infection. Have energy. Grow and develop. What are tips for getting more protein in your diet? Try to replace processed carbohydrates with high-quality protein. Snack on nuts and seeds instead of chips. Replace baked desserts with [place] yogurt. Eat protein foods from both plant and animal sources. Add beans and peas to salads, soups, and side dishes. Include a protein food with each meal and snack. Eat more whole grains. Add powdered milk or protein powder to hot cereals. Add  peanut butter to toast or crackers instead of butter. Reading food labels You can find the amount of protein in a food item by looking at the nutrition facts label. Use the total grams listed to help you reach your daily goal. What foods are high in protein?  High-protein foods contain 4 grams (g) or more of protein per serving. They include: Grains Quinoa (cooked) -- 1 cup (185 g) has 8 g of protein. Whole wheat pasta (cooked) -- 1 cup (140 g) has 6 g of protein. Dairy Cottage cheese --  cup (114 g) has 13.4 g of protein. Milk -- 1 cup (237 mL) has 8 g of protein. Cheese (hard) -- 1 oz (28 g) has 7 g of protein. Yogurt, regular -- 6 oz (170 g) has 8 g of protein. Greek yogurt -- 6 oz (200 g) has 18 g protein. Meat Beef, ground sirloin (cooked) -- 3 oz (85 g) has 24 g of protein. Chicken breast, boneless and skinless (cooked) -- 3 oz (85 g) has 25 g of protein. Egg -- 1 egg has 6 g of protein. Fish, filet (cooked) -- 3 oz (85g ) has 21-23 g of protein. Lamb (cooked) -- 3 oz (85 g) has 24 g of protein. Pork tenderloin (cooked) -- 3 oz (85 g) has 23 g of protein. Tuna (canned in water) -- 3 oz (85 g) has 20 g of protein. Plant protein Garbanzo beans (canned or cooked) --  cup (130 g) has 6-7 g of protein. Kidney beans (canned or cooked) --  cup (130 g) has 6-7 g of protein. Nuts (peanuts, pistachios,  almonds) -- 1 oz (28 g) has 6 g of protein. Peanut butter -- 1 oz (32 g) has 7-8 g of protein. Pumpkin seeds -- 1 oz (28 g) has 8.5 g of protein. Soybeans (roasted) -- 1 oz (28 g) has 8 g of protein. Soybeans (cooked) --  cup (90 g) has 11 g of protein. Soy milk -- 1 cup (250 mL) has 5-10 g of protein. Soy or vegetable patty -- 1 patty has 11 g of protein. Sunflower seeds -- 1 oz (28 g) has 5.5 g of protein. Buckwheat -- 1 oz (33 g) has 4.3 g of protein. Tofu (firm) --  cup (124 g) has 20 g of protein. Tempeh --  cup (83 g) has 16 g of protein. The items listed above may not be a  complete list of foods that are high in protein. Actual amounts of protein may be different depending on processing. Talk with an expert in healthy eating called a dietitian to learn more. What foods are low in protein?  Low-protein foods contain 3 grams (g) or less of protein per serving. They include: Fruits Fruit or vegetable juice --  cup (125 mL) has 1 g of protein. Vegetables Beets (raw or cooked) --  cup (68 g) has 1.5 g of protein. Broccoli (raw or cooked) --  cup (44 g) has 2 g of protein. Collard greens (raw or cooked) --  cup (42 g) has 2 g of protein. Green beans (raw or cooked) --  cup (83 g) has 1 g of protein. Green peas (canned) --  cup (80 g) has 3.5 g of protein. Potato (baked with skin) -- 1 medium potato (173 g) has 3 g of protein. Spinach (cooked) --  cup (90 g) has 3 g of protein. Squash (cooked) --  cup (90 g) has 1.5 g of protein. Avocado -- 1 cup (146 g) has 2.7 g of protein. Grains Bran cereal --  cup (30 g) has 3 g of protein. Whole wheat bread -- 1 slice has 3 g of protein. Corn (fresh or cooked) --  cup (77 g) has 2 g of protein. Flour tortilla -- One 6-inch (15 cm) tortilla has 2.5 g of protein. Muffins -- 1 small muffin (2 oz or 57 g) has 3 g of protein. Oatmeal (cooked) --  cup (40 g) has 3 g of protein. Brown rice (cooked) --  cup (78 g) has 2.5 g of protein. Dairy Cream cheese -- 1 oz (29 g) has 2 g of protein. Creamer (half-and-half) -- 1 oz (29 mL) has 1 g of protein. Frozen yogurt --  cup (72 g) has 3 g of protein. Sour cream --  cup (75 g) has 2.5 g of protein. The items listed above may not be a complete list of foods that are low in protein. Actual amounts of protein may be different depending on processing. Talk with an expert in healthy eating to learn more. This information is not intended to replace advice given to you by your health care provider. Make sure you discuss any questions you have with your health care  provider. Document Revised: 11/19/2022 Document Reviewed: 11/19/2022 Elsevier Patient Education  2024 [employer].

## 2024-01-21 ENCOUNTER — Ambulatory Visit: Admitting: Physician Assistant

## 2024-01-22 ENCOUNTER — Ambulatory Visit: Admitting: Physician Assistant

## 2024-01-22 ENCOUNTER — Encounter: Payer: Self-pay | Admitting: Physician Assistant

## 2024-01-22 ENCOUNTER — Other Ambulatory Visit (INDEPENDENT_AMBULATORY_CARE_PROVIDER_SITE_OTHER)

## 2024-01-22 VITALS — BP 106/58 | HR 101 | Ht 61.0 in | Wt 85.2 lb

## 2024-01-22 DIAGNOSIS — K5900 Constipation, unspecified: Secondary | ICD-10-CM

## 2024-01-22 DIAGNOSIS — R1030 Lower abdominal pain, unspecified: Secondary | ICD-10-CM

## 2024-01-22 DIAGNOSIS — R110 Nausea: Secondary | ICD-10-CM | POA: Diagnosis not present

## 2024-01-22 DIAGNOSIS — R1084 Generalized abdominal pain: Secondary | ICD-10-CM | POA: Diagnosis not present

## 2024-01-22 DIAGNOSIS — R634 Abnormal weight loss: Secondary | ICD-10-CM

## 2024-01-22 DIAGNOSIS — K5909 Other constipation: Secondary | ICD-10-CM

## 2024-01-22 LAB — BASIC METABOLIC PANEL WITH GFR
BUN: 27 mg/dL — ABNORMAL HIGH (ref 6–23)
CO2: 30 meq/L (ref 19–32)
Calcium: 10 mg/dL (ref 8.4–10.5)
Chloride: 102 meq/L (ref 96–112)
Creatinine, Ser: 1.19 mg/dL (ref 0.40–1.20)
GFR: 41.55 mL/min — ABNORMAL LOW (ref >60.00)
Glucose, Bld: 98 mg/dL (ref 70–99)
Potassium: 4.1 meq/L (ref 3.5–5.1)
Sodium: 138 meq/L (ref 135–145)

## 2024-01-22 MED ORDER — FAMOTIDINE 20 MG PO TABS: 20.0000 mg | ORAL_TABLET | Freq: Every day | ORAL | 5 refills | Status: DC

## 2024-01-22 NOTE — Patient Instructions (Signed)
 Your provider has requested that you go to the basement level for lab work before leaving today. Press B on the elevator. The lab is located at the first door on the left as you exit the elevator.  We have sent the following medications to your pharmacy for you to pick up at your convenience: Pepcid  20 mg daily.   You have been scheduled for a CT scan of the abdomen and pelvis at [facility], 1st floor Radiology. You are scheduled on Wednesday 01/29/24 at 12:30 pm. You should arrive 15 minutes prior to your appointment time for registration.    Please follow the written instructions below on the day of your exam:   1) Do not eat anything after 10:30 am (4 hours prior to your test)   You may take any medications as prescribed with a small amount of water, if necessary. If you take any of the following medications: METFORMIN, GLUCOPHAGE, GLUCOVANCE, AVANDAMET, RIOMET, FORTAMET, ACTOPLUS MET, JANUMET, GLUMETZA or METAGLIP, you MAY be asked to HOLD this medication 48 hours AFTER the exam.   The purpose of you drinking the oral contrast is to aid in the visualization of your intestinal tract. The contrast solution may cause some diarrhea. Depending on your individual set of symptoms, you may also receive an intravenous injection of x-ray contrast/dye. Plan on being at [facility] for 45 minutes or longer, depending on the type of exam you are having performed.   If you have any questions regarding your exam or if you need to reschedule, you may call [facility] at [phone] between the hours of 8:00 am and 5:00 pm, Monday-Friday.

## 2024-01-22 NOTE — Progress Notes (Deleted)
 Cardiology Office Note   Date:  01/22/2024   ID:  Cindy Meza, DOB [birth year], MRN [id number]  PCP:  Garald [name], MD  Cardiologist:   Vina Gull, MD   Pt presents for f/u of orthostatic hypotension    History of Present Illness: Cindy Meza is a 88 y.o. female with a history  fatigue, orthostatic intolerance, mitral regurgitation, asthma, GERD  CT of abdomen showed aortic atherosclerosis  Last myoview  in 2019 showed no ischemia EF down but previous OK     Echo in 2019 LVEF 50 to 55% with mild diastolic dysfunction and mod MR   She did not tolerate rosuvastatin  (constipation)    Sensitive to several meds   I saw the pt in May 2024  Since seen she continues florinef  She denies CP  Breathing is good   NO palpitations  Since seen she says she has not been bothered by dizziness Has some ankle swelling, worse at night  OK in am Has some nausea  Does not get every day   I aw the pt in clinic in Nov 2024   She was seen by A Thukkani in March 2025  Outpatient Medications Prior to Visit  Medication Sig Dispense Refill   albuterol  (PROVENTIL  HFA) 108 (90 Base) MCG/ACT inhaler Inhale 1-2 puffs into the lungs every 4 (four) hours as needed for wheezing or shortness of breath. 8.5 g 5   aspirin  81 MG tablet Take 81 mg by mouth daily.     budesonide -formoterol  (SYMBICORT ) 80-4.5 MCG/ACT inhaler Inhale 2 puffs into the lungs 2 (two) times daily. 1 each 11   calcium  carbonate (TUMS) 500 MG chewable tablet Chew 1 tablet by mouth 3 (three) times daily.     Calcium -Magnesium-Vitamin D  (CALCIUM  500 PO) Take 1 tablet by mouth 2 (two) times daily with a meal. With lunch and supper     [employer] OMEGA-3 KRILL OIL 500 MG CAPS Take 500 capsules by mouth daily.     Cyanocobalamin (VITAMIN B-12) 1000 MCG SUBL Place 1 tablet (1,000 mcg total) under the tongue in the morning. 100 tablet 3   estradiol  (ESTRACE ) 0.1 MG/GM vaginal cream Place vaginally.     furosemide  (LASIX ) 20 MG tablet  Take 1 tablet (20 mg total) by mouth daily as needed for edema (for weight gain 2 to 3 lbs in 24 hrs or 5 lbs in 7 days.). 30 tablet 0   LORazepam  (ATIVAN ) 0.5 MG tablet Take 0.5-1 tablets (0.25-0.5 mg total) by mouth at bedtime as needed for anxiety (insomnia, overactive bladder). 30 tablet 2   metoprolol  succinate (TOPROL -XL) 25 MG 24 hr tablet Take 0.5 tablets (12.5 mg total) by mouth at bedtime. 45 tablet 3   nystatin ointment (MYCOSTATIN) Apply 1 Application topically 2 (two) times daily.     pantoprazole  (PROTONIX ) 20 MG tablet Take 1 tablet (20 mg total) by mouth daily. 90 tablet 3   polyethylene glycol (MIRALAX  / GLYCOLAX ) 17 g packet Take 17 g by mouth daily.     spironolactone  (ALDACTONE ) 25 MG tablet Take 0.5 tablets (12.5 mg total) by mouth daily. Take with lunch 45 tablet 3   Vitamin D -Vitamin K (VITAMIN K2-VITAMIN D3 PO) Take 1 capsule by mouth daily.     Wheat Dextrin (BENEFIBER DRINK MIX PO) Take 1 Capful by mouth daily.     Facility-Administered Medications Prior to Visit  Medication Dose Route Frequency Provider Last Rate Last Admin   Romosozumab -aqqg (EVENITY ) 105 MG/1. injection 210 mg  210  mg Subcutaneous Once Shamleffer, Ibtehal Jaralla, MD         Allergies:   Fosamax [alendronate sodium], Gabapentin , Lidocaine , Midodrine , Risedronate sodium, Shellfish allergy, and Statins   Past Medical History:  Diagnosis Date   Abdominal pain 03/30/2015   Dr Avram     /16, 1/21 ?fosamax related  S/p gyn eval, abd CT (-)  11/16 50% better  Better off Fosamax, off Miralax    12/22 Recurrent abd pain - pt saw Dr Avram; CT w/constipation - using Miralax  and Benefiber - better now     Allergy    Anemia    Asthma    Dr. Darlean   Blood transfusion without reported diagnosis    Breast cyst    Left   Cataract    Dr. Roz   Cerumen impaction 04/06/2016   9/17 L, 10/19 B     Chest pressure 07/22/2012   1/14, 8/17  Recurrent - ?asthma related (11/17)  Advair MDI - use qd, not  prn  We added Singulair   Cardiac w/up was (-) - --- Dr Okey     Closed right hip fracture (HCC) 06/12/2016   Dr Dozier  R hip fx - intermedullary nail 06/13/17 s/p   On Prolia , calcium , Vit D        Complication of anesthesia    ' I HAD HALLUCINATIONS WHEN THEY DID MY HIP    GERD (gastroesophageal reflux disease)    Heart murmur    Hemorrhoids    History of colonic polyps Gessner   hx of adenomas (small) 1998   Hot flash, menopausal 02/15/2012   Ongoing   D/c estrogens due to breast abnormalities 2013     Nausea 02/12/2019   8/20 d/c Midodrin, Florinef   Occasional.   Nausea after taking Pantoprazole  - take w/food     OA (osteoarthritis)    Orthostatic hypotension    Osteopenia    Osteoporosis    PAC (premature atrial contraction)    Upper respiratory infection 06/13/2023   Zpac if worse     Vitamin D  deficiency     Past Surgical History:  Procedure Laterality Date   ABDOMINAL HYSTERECTOMY     complete   APPENDECTOMY     BREAST CYST EXCISION     CATARACT EXTRACTION, BILATERAL     04/23/18 left eye, 04/02/18 right eye   COLONOSCOPY  Multiple   Adenomatous colon polyps   ESOPHAGOGASTRODUODENOSCOPY  2010   INTRAMEDULLARY (IM) NAIL INTERTROCHANTERIC Right 06/13/2016   Procedure: INTRAMEDULLARY (IM) NAIL INTERTROCHANTRIC;  Surgeon: Eva Dozier, MD;  Location: [facility] OR;  Service: Orthopedics;  Laterality: Right;   RIGHT/LEFT HEART CATH AND CORONARY ANGIOGRAPHY N/A 07/31/2023   Procedure: RIGHT/LEFT HEART CATH AND CORONARY ANGIOGRAPHY;  Surgeon: Darron Deatrice LABOR, MD;  Location: [facility] INVASIVE CV LAB;  Service: Cardiovascular;  Laterality: N/A;   TONSILLECTOMY       Social History:  The patient  reports that she has never smoked. She has never used smokeless tobacco. She reports that she does not currently use alcohol. She reports that she does not use drugs.   Family History:  The patient's family history includes Diabetes (age of onset: 22) in her mother; Hypertension in an other  family member.  No history of CAD known    Mother had PPM    ROS:  Please see the history of present illness. All other systems are reviewed and  Negative to the above problem except as noted.    PHYSICAL EXAM: VS:  There were  no vitals taken for this visit.   GEN: Thin 88 year old in no acute distress  HEENT: normal  Neck: JVP is normal  No carotid bruits Cardiac: RRR; no murmur   No LE  edema  Wearing support socks Respiratory:  clear to auscultation  GI: soft, no masss No hepatomegaly    EKG:  EKG is not ordered  Lipid Panel    Component Value Date/Time   CHOL 227 (H) 07/12/2021 1222   TRIG 45 07/12/2021 1222   HDL 107 07/12/2021 1222   CHOLHDL 2.1 07/12/2021 1222   CHOLHDL 2 04/10/2018 1207   VLDL 13.6 04/10/2018 1207   LDLCALC 112 (H) 07/12/2021 1222   LDLDIRECT 104.0 03/17/2013 1026      Wt Readings from Last 3 Encounters:  01/22/24 85 lb 4 oz (38.7 kg)  01/01/24 80 lb 8 oz (36.5 kg)  12/04/23 84 lb 8 oz (38.3 kg)      ASSESSMENT AND PLAN:  1  Orthostatic hypotension  Overall doing good  I encouraged her to keep on florinef    K is OK   No edema on exam today Stay hydrated  Stay active   2  CAD Plaquing of aorta and coronary arteries  Normal myovue in Dec 2019  She remains asymptomatic    Keep on ASA  She did not tolerate statin.  Very senstive to meds    3 Hyperlipidemia LDL  112 in 2023   Intolerant to statins  Follow  She will get labs in December   I will see in 6 months    Current medicines are reviewed at length with the patient today.  The patient does not have concerns regarding medicines.   Signed, Vina Gull, MD  01/22/2024 11:38 AM    [facility] [address] [place], [place], [place]  [address] Phone: [phone]; Fax: [phone]

## 2024-01-22 NOTE — Progress Notes (Signed)
 Chief Complaint: Abdominal Pain, Nausea and Weight loss  HPI:    Cindy Meza is an 88 year old African-American female with a past medical history as listed below including nonischemic cardiomyopathy due to severe mitral regurgitation, chronic abdominal pain problems and constipation, known to Dr. Avram, who was referred to me by [name], [name], MD for a complaint of abdominal pain and nausea.     10/31/2007 EGD normal.    09/05/2017 colonoscopy with internal hemorrhoids and otherwise normal.  No repeat recommended due to age.    05/23/2021 CTAP with contrast showed moderate stool burden suggestive of constipation and small hiatal hernia as well as aortic atherosclerosis.    09/07/2022 patient seen in clinic by Dr. Avram for chronic constipation and periumbilical abdominal pain.  At that time she was improved on therapy with MiraLAX , Benefiber and prunes.  It sounded like Midodrine  may be causing her abdominal pain, but life was tolerable and that medication was important to her.    07/25/2023 echocardiogram with LVEF 25%.    07/31/2023 cardiac catheterization with mild LAD lesion 30% stenosed, OST LAD to proximal LAD lesion 40% stenosis.  Also moderate to severe pulmonary hypertension.    10/02/2023 renal ultrasound done for stage III kidney disease was normal.    11/27/2023 CBC with a hemoglobin of 11.7, MCV and platelets normal, CMP with a BUN of 25, alk phos 36 and otherwise normal.  Patient was noted to have stable labs by PCP with no concerns.      11/27/2023 patient saw PCP and at that time changed from Pantoprazole  40 mg down to Pantoprazole  20 mg, they discussed fatigue and some weight loss, though her weight had been stable since March.    Today, unfortunately the patient is a poor historian, it is hard to garner what she is actually dealing with symptom wise today.  She describes some abdominal pain and tells me that she was seen for this in the past by Dr. Avram and it was thought  related to some of her medicines.  She is not on Midodrine  anymore.  Tells me that this pain increased it sounds like at some point around her peri-umbilicus/lower abdomen and she went to see her gynecologist who told her it was not GYN in origin.  Apparently then another doctor, possibly her cardiologist changed some medications around and her abdominal pain got better, sort of back to her baseline of low degree.  She is just worried that it will get worse and that no one exactly knows why it is there.  She does continue MiraLAX  and Benefiber as well as prunes for constipation and tells me she is no longer constipated.    Also vaguely discusses weight loss but tells me that she is not losing weight anymore   .    Also discusses some gas.    Also discusses some nausea which sounds like it is in the morning.  I cannot ascertain from her history whether or not this has increased since her Pantoprazole  was decreased back at the end of May.  She is worried about going back on high doses of this medicine and that people seem to be experimenting with me.    Denies fever, chills, vomiting, symptoms that awaken her from sleep or blood in her stool.  Past Medical History:  Diagnosis Date   Abdominal pain 03/30/2015   Dr Avram     /16, 1/21 ?fosamax related  S/p gyn eval, abd CT (-)  11/16 50% better  Better off Fosamax, off Miralax    12/22 Recurrent abd pain - pt saw Dr Avram; CT w/constipation - using Miralax  and Benefiber - better now     Allergy    Anemia    Asthma    Dr. Darlean   Blood transfusion without reported diagnosis    Breast cyst    Left   Cataract    Dr. Roz   Cerumen impaction 04/06/2016   9/17 L, 10/19 B     Chest pressure 07/22/2012   1/14, 8/17  Recurrent - ?asthma related (11/17)  Advair MDI - use qd, not prn  We added Singulair   Cardiac w/up was (-) - --- Dr Okey     Closed right hip fracture (HCC) 06/12/2016   Dr Dozier  R hip fx - intermedullary nail 06/13/17 s/p   On  Prolia , calcium , Vit D        Complication of anesthesia    ' I HAD HALLUCINATIONS WHEN THEY DID MY HIP    GERD (gastroesophageal reflux disease)    Heart murmur    Hemorrhoids    History of colonic polyps Gessner   hx of adenomas (small) 1998   Hot flash, menopausal 02/15/2012   Ongoing   D/c estrogens due to breast abnormalities 2013     Nausea 02/12/2019   8/20 d/c Midodrin, Florinef   Occasional.   Nausea after taking Pantoprazole  - take w/food     OA (osteoarthritis)    Orthostatic hypotension    Osteopenia    PAC (premature atrial contraction)    Upper respiratory infection 06/13/2023   Zpac if worse     Vitamin D  deficiency     Past Surgical History:  Procedure Laterality Date   ABDOMINAL HYSTERECTOMY     complete   APPENDECTOMY     BREAST CYST EXCISION     CATARACT EXTRACTION, BILATERAL     04/23/18 left eye, 04/02/18 right eye   COLONOSCOPY  Multiple   Adenomatous colon polyps   ESOPHAGOGASTRODUODENOSCOPY  2010   INTRAMEDULLARY (IM) NAIL INTERTROCHANTERIC Right 06/13/2016   Procedure: INTRAMEDULLARY (IM) NAIL INTERTROCHANTRIC;  Surgeon: Eva Dozier, MD;  Location: [facility] OR;  Service: Orthopedics;  Laterality: Right;   RIGHT/LEFT HEART CATH AND CORONARY ANGIOGRAPHY N/A 07/31/2023   Procedure: RIGHT/LEFT HEART CATH AND CORONARY ANGIOGRAPHY;  Surgeon: Darron Deatrice LABOR, MD;  Location: [facility] INVASIVE [facility];  Service: Cardiovascular;  Laterality: N/A;   TONSILLECTOMY      Current Outpatient Medications  Medication Sig Dispense Refill   albuterol  (PROVENTIL  HFA) 108 (90 Base) MCG/ACT inhaler Inhale 1-2 puffs into the lungs every 4 (four) hours as needed for wheezing or shortness of breath. 8.5 g 5   aspirin  81 MG tablet Take 81 mg by mouth daily.     budesonide -formoterol  (SYMBICORT ) 80-4.5 MCG/ACT inhaler Inhale 2 puffs into the lungs 2 (two) times daily. 1 each 11   calcium  carbonate (TUMS) 500 MG chewable tablet Chew 1 tablet by mouth 3 (three) times daily.      Calcium -Magnesium-Vitamin D  (CALCIUM  500 PO) Take 1 tablet by mouth 2 (two) times daily with a meal. With lunch and supper (Patient not taking: Reported on 01/01/2024)     [employer] OMEGA-3 KRILL OIL 500 MG CAPS Take 500 capsules by mouth daily.     Cyanocobalamin (VITAMIN B-12) 1000 MCG SUBL Place 1 tablet (1,000 mcg total) under the tongue in the morning. 100 tablet 3   estradiol  (ESTRACE ) 0.1 MG/GM vaginal cream Place vaginally.     furosemide  (  LASIX ) 20 MG tablet Take 1 tablet (20 mg total) by mouth daily as needed for edema (for weight gain 2 to 3 lbs in 24 hrs or 5 lbs in 7 days.). 30 tablet 0   LORazepam  (ATIVAN ) 0.5 MG tablet Take 0.5-1 tablets (0.25-0.5 mg total) by mouth at bedtime as needed for anxiety (insomnia, overactive bladder). (Patient not taking: Reported on 01/01/2024) 30 tablet 2   metoprolol  succinate (TOPROL -XL) 25 MG 24 hr tablet Take 0.5 tablets (12.5 mg total) by mouth at bedtime. 45 tablet 3   pantoprazole  (PROTONIX ) 20 MG tablet Take 1 tablet (20 mg total) by mouth daily. 90 tablet 3   polyethylene glycol (MIRALAX  / GLYCOLAX ) 17 g packet Take 17 g by mouth daily.     spironolactone  (ALDACTONE ) 25 MG tablet Take 0.5 tablets (12.5 mg total) by mouth daily. Take with lunch 45 tablet 3   Vitamin D -Vitamin K (VITAMIN K2-VITAMIN D3 PO) Take 1 capsule by mouth daily.     Wheat Dextrin (BENEFIBER DRINK MIX PO) Take 1 Capful by mouth daily.     Current Facility-Administered Medications  Medication Dose Route Frequency Provider Last Rate Last Admin   Romosozumab -aqqg (EVENITY ) 105 MG/1. injection 210 mg  210 mg Subcutaneous Once Shamleffer, Ibtehal Jaralla, MD        Allergies as of 01/22/2024 - Review Complete 11/27/2023  Allergen Reaction Noted   Fosamax [alendronate sodium]  10/06/2014   Gabapentin   09/21/2014   Lidocaine  Swelling 10/06/2013   Midodrine   02/12/2019   Risedronate sodium  08/17/2011   Shellfish allergy Hives 10/06/2013   Statins  07/16/2008    Family  History  Problem Relation Age of Onset   Diabetes Mother 14       Deceased   Hypertension Other    Colon cancer Neg Hx    Pancreatic cancer Neg Hx    Stomach cancer Neg Hx    Esophageal cancer Neg Hx    Rectal cancer Neg Hx     Social History   Socioeconomic History   Marital status: Widowed    Spouse name: Not on file   Number of children: Not on file   Years of education: Not on file   Highest education level: Not on file  Occupational History   Occupation: retired  Tobacco Use   Smoking status: Never   Smokeless tobacco: Never  Vaping Use   Vaping status: Never Used  Substance and Sexual Activity   Alcohol use: Not Currently    Alcohol/week: 0.0 standard drinks of alcohol   Drug use: No   Sexual activity: Not Currently  Other Topics Concern   Not on file  Social History Narrative   She lives alone in a [place] home.  No children.   Retired Child psychotherapist.   Highest level of education:  Masters degree      Right Handed    Lives in one story home / lives alone 2025   Social Drivers of Health   Financial Resource Strain: Low Risk  (08/28/2023)   Overall Financial Resource Strain (CARDIA)    Difficulty of Paying Living Expenses: Not hard at all  Food Insecurity: No Food Insecurity (10/30/2023)   Hunger Vital Sign    Worried About Running Out of Food in the Last Year: Never true    Ran Out of Food in the Last Year: Never true  Transportation Needs: No Transportation Needs (10/30/2023)   PRAPARE - Administrator, Civil Service (Medical): No  Lack of Transportation (Non-Medical): No  Physical Activity: Inactive (08/28/2023)   Exercise Vital Sign    Days of Exercise per Week: 0 days    Minutes of Exercise per Session: 0 min  Stress: No Stress Concern Present (08/28/2023)   [employer] of Occupational Health - Occupational Stress Questionnaire    Feeling of Stress : Not at all  Social Connections: Moderately Integrated (08/28/2023)   Social  Connection and Isolation Panel    Frequency of Communication with Friends and Family: More than three times a week    Frequency of Social Gatherings with Friends and Family: Never    Attends Religious Services: More than 4 times per year    Active Member of [employer] or Organizations: Yes    Attends Banker Meetings: Never    Marital Status: Widowed  Intimate Partner Violence: Not At Risk (10/30/2023)   Humiliation, Afraid, Rape, and Kick questionnaire    Fear of Current or Ex-Partner: No    Emotionally Abused: No    Physically Abused: No    Sexually Abused: No    Review of Systems:    Constitutional: No weight loss, fever or chills Cardiovascular: No chest pain, chest pressure or palpitations   Respiratory: No SOB Gastrointestinal: See HPI and otherwise negative   Physical Exam:  Vital signs: BP (!) 106/58 (BP Location: Right Arm, Patient Position: Sitting, Cuff Size: Normal)   Pulse (!) 101   Ht 5' 1 (1.549 m)   Wt 85 lb 4 oz (38.7 kg)   BMI 16.11 kg/m    Constitutional:   Pleasant Elderly AA female appears to be in NAD, Well developed, Well nourished, alert and cooperative Respiratory: Respirations even and unlabored. Lungs clear to auscultation bilaterally.   No wheezes, crackles, or rhonchi.  Cardiovascular: Normal S1, S2. No MRG. Regular rate and rhythm. No peripheral edema, cyanosis or pallor.  Gastrointestinal:  Soft, nondistended, nontender. No rebound or guarding. Normal bowel sounds. No appreciable masses or hepatomegaly. Rectal:  Not performed.  Psychiatric: Demonstrates good judgement and reason without abnormal affect or behaviors. +very limited history, memory impairment  RELEVANT LABS AND IMAGING: CBC    Component Value Date/Time   WBC 6.1 11/27/2023 1421   RBC 3.67 (L) 11/27/2023 1421   HGB 11.7 (L) 11/27/2023 1421   HGB 11.2 09/18/2023 1457   HCT 35.0 (L) 11/27/2023 1421   HCT 35.3 09/18/2023 1457   PLT 193.0 11/27/2023 1421   PLT 217  09/18/2023 1457   MCV 95.3 11/27/2023 1421   MCV 96 09/18/2023 1457   MCH 30.6 09/18/2023 1457   MCH 31.0 08/02/2023 0248   MCHC 33.4 11/27/2023 1421   RDW 13.5 11/27/2023 1421   RDW 12.3 09/18/2023 1457   LYMPHSABS 2.3 11/27/2023 1421   MONOABS 0.5 11/27/2023 1421   EOSABS 0.1 11/27/2023 1421   BASOSABS 0.0 11/27/2023 1421    CMP     Component Value Date/Time   NA 137 11/27/2023 1421   NA 140 09/18/2023 1452   K 4.3 11/27/2023 1421   CL 101 11/27/2023 1421   CO2 30 11/27/2023 1421   GLUCOSE 93 11/27/2023 1421   BUN 25 (H) 11/27/2023 1421   BUN 17 09/18/2023 1452   CREATININE 1.19 11/27/2023 1421   CALCIUM  10.0 11/27/2023 1421   PROT 7.4 11/27/2023 1421   PROT 6.7 09/18/2023 1452   ALBUMIN  4.2 11/27/2023 1421   ALBUMIN  4.0 09/18/2023 1452   AST 25 11/27/2023 1421   ALT 18 11/27/2023 1421  ALKPHOS 36 (L) 11/27/2023 1421   BILITOT 0.6 11/27/2023 1421   BILITOT 0.4 09/18/2023 1452   GFRNONAA 49 (L) 09/09/2023 1425   GFRAA 72 07/14/2020 1238    Assessment: 1.  Lower abdominal pain: Vague description by the patient, apparently worse for a period of time recently but now better, describes evaluation by gynecology though I cannot see that workup in our computer system does not appear that she discussed this with her PCP, she has had similar complaints in the past and this was thought related to medication she was on, she is no longer constipated on MiraLAX , prunes and Benefiber, no recent imaging; consider IBS versus medication side effect versus constipation vs mass or lesion 2.  Constipation: Patient reports daily bowel movements with MiraLAX , prunes and Benefiber 3.  Nausea: Vague description of nausea, seems to occur after she takes her medications in the morning, possibly because she takes these on an empty stomach, she is on Pantoprazole  which was just decreased to 20 mg, not sure if symptoms were worse after that change, she is leery to go back on high doses of this as she  has been told that she should not be on it forever; Consider relation to gastritis versus dyspepsia versus medications 4.  Weight loss: Patient's weight appears fairly stable since March, she does not think this is a problem anymore  Plan: 1.  Had a long discussion with the patient today about her symptoms, it is very unclear what she is actually being bothered by.  It sounds like her abdominal pain is her biggest complaint which seems fairly chronic for her.  She does tell me it increased for period of time though and she is worried about it.  Went ahead and ordered a CT of the abdomen pelvis with contrast for further evaluation. 2.  Also had a long discussion with the patient that she is very high risk at this point given her heart failure history for any type of procedure.  I would strongly advise against any elective procedure in her.  She acknowledged this after we had a long discussion. 3.  Also discussed with the patient that due to the fact that we are slightly limited in how we can work this up imaging, labs and trying medications is what we can do.  She is nervous about trying different medications. 4.  At first discussed increasing Pantoprazole  back to her 40 mg but she does not want to do this.  Went ahead and added Pepcid  20 mg daily instead.  She will take this in addition to her Pantoprazole  20 mg.  Hopefully this will help with her nausea.  Could consider Zofran  as needed in the future. 5.  Very difficult case.  I would suggest that she follow-up with Dr. Avram so that he can advise her further.  This was scheduled in September.  Delon Failing, PA-C [facility] 01/22/2024, 10:45 AM  Cc: [name], [name], MD     [facility] Attending   Elderly woman with chronic functional GI symptoms who has been losing some weight, has nausea and perceived medication intolerance which is not a new issue for her.  Agree with workup as above and I will see her in  follow-up.  Lupita CHARLENA Avram, MD, NOLIA

## 2024-01-23 ENCOUNTER — Ambulatory Visit: Payer: Self-pay | Admitting: Physician Assistant

## 2024-01-23 NOTE — Telephone Encounter (Signed)
 PT returning call. Please advise.

## 2024-01-24 ENCOUNTER — Ambulatory Visit: Admitting: Internal Medicine

## 2024-01-27 NOTE — Telephone Encounter (Signed)
 Prolia  VOB initiated via [email or web address]  Next Prolia  inj DUE: 02/03/24

## 2024-01-28 ENCOUNTER — Other Ambulatory Visit: Payer: Self-pay

## 2024-01-28 NOTE — Patient Instructions (Signed)
 Visit Information  Thank you for taking time to visit with me today. Please don't hesitate to contact me if I can be of assistance to you before our next scheduled appointment.  Your next care management appointment is by telephone on 02/26/24 at 11:30 am.   Please call the care guide team at [phone] if you need to cancel, schedule, or reschedule an appointment.   Please call the Suicide and Crisis Lifeline: 988 call the [place]  National Suicide Prevention Lifeline: 530-798-7860 or TTY: 873-158-5013 TTY 814-603-2438) to talk to a trained counselor call 1-800-273-TALK (toll free, 24 hour hotline) if you are experiencing a Mental Health or Behavioral Health Crisis or need someone to talk to.  Heddy Shutter, RN, MSN, BSN, CCM [place]  [facility], Population Health Case Manager Phone: [phone]    Heart Failure: Eating Plan Heart failure is a long-term condition where the heart can't pump enough blood through the body. When this happens, parts of the body don't get the blood and oxygen they need. Living with heart failure can be hard. But a healthy lifestyle and choosing the right foods may help to improve your symptoms. If you have heart failure, your eating plan will include limiting the amount of salt, also called sodium, and unhealthy fats you eat. What are tips for following this plan? Reading food labels Check food labels for the amount of sodium per serving. Choose foods that have less than 140 mg (milligrams) of sodium in each serving. Check food labels for the number of calories per serving. This is important if you need to limit your daily calorie intake to lose weight. Check food labels for the serving size. If you eat more than one serving, you'll be eating more sodium and calories than what's listed on the label. Look for foods with the words sodium-free, very low sodium, or low sodium on the package. Foods labeled as reduced sodium, lightly  salted, or no salt added may still have more sodium than what's recommended for you. Cooking Avoid adding salt when cooking. Before using any salt substitutes, talk with your health care provider or an expert in healthy eating called a dietitian. Season food with salt-free seasonings, spices, or herbs. Check the label of seasoning mixes to make sure they don't contain salt. Cook with heart-healthy oils, such as olive, canola, soybean, or sunflower oil. Do not fry foods. Cook foods using low-fat methods, like baking, boiling, grilling, and broiling. Limit unhealthy fats when cooking. To do this: Remove the skin from poultry, such as chicken. Remove all the fat you can see on meats. Skim the fat off from stews, soups, and gravies before serving them. Meal planning  Limit your intake of: Processed, canned, or prepackaged foods. Foods that are high in trans fat, such as fried foods. Sweets, desserts, sugary drinks, and other foods with added sugar. Full-fat dairy products, such as whole milk. Eat a balanced diet. This may include: 4-5 servings of fruit each day and 4-5 servings of vegetables each day. At each meal, try to fill one-half of your plate with fruits and vegetables. Up to 6-8 servings of whole grains each day. Up to 2 servings of lean meat, poultry, or fish each day. One serving of meat is equal to 3 oz (85 g). This is about the same size as a deck of cards. 2 servings of low-fat dairy each day. Heart-healthy fats. Healthy fats called omega-3 fatty acids are a good choice. They're found in foods such as flaxseed and  cold-water fish like sardines, salmon, and mackerel. Aim to eat 25-35 g (grams) of fiber a day. Foods that are high in fiber include apples, broccoli, carrots, beans, peas, and whole grains. Do not add salt or condiments that contain salt (such as soy sauce) to foods before eating. When eating at a restaurant, ask that your food be prepared with less salt or no salt, if  possible. Try to eat 2 or more plant-based or meat-free meals each week. Cook more meals at home and eat less at restaurants, buffets, and fast food places. General information Do not eat more than 2,300 mg of sodium a day. The amount of sodium that's recommended for you may be lower, depending on your condition. Stay at a healthy weight as told. Ask your provider what a healthy weight is for you. Check your weight every day. Work with your provider and dietitian to make a plan that will help you lose weight or stay at your current weight. Limit how much fluid you drink. Ask your provider or dietitian how much fluid you can have each day. Limit or avoid alcohol as told. Recommended foods Fruits All fresh, frozen, and canned fruits. Dried fruits, such as raisins, prunes, and cranberries. Vegetables All fresh vegetables. Vegetables that are frozen without sauce or added salt. Low-sodium or sodium-free canned vegetables. Grains Bread with less than 80 mg of sodium per slice. Whole-wheat pasta, quinoa, and brown rice. Oats and oatmeal. Barley. Millet. Grits and cream of wheat. Whole-grain and whole-wheat cold cereal. Meats and other protein foods Lean cuts of meat. Skinless chicken and [place]. Fish with high omega-3 fatty acids, such as salmon, sardines, and other cold-water fishes. Eggs. Dried beans, peas, and edamame. Unsalted nuts and nut butters. Dairy Low-fat or nonfat (skim) milk and dried milk. Rice milk, soy milk, and almond milk. Low-fat or nonfat yogurt. Small amounts of reduced-sodium block cheese. Low-sodium cottage cheese. Fats and oils Olive, canola, soybean, flaxseed, avocado, or sunflower oil. Sweets and desserts Applesauce. Granola bars. Sugar-free pudding and gelatin. Frozen fruit bars. Seasoning and other foods Fresh and dried herbs. Lemon or lime juice. Vinegar. Low-sodium ketchup. Salt-free marinades, salad dressings, sauces, and seasonings. The items listed above may not  be all the foods and drinks you can have. Talk with a dietitian to learn more. Foods to avoid Fruits Fruits that are dried with preservatives that contain sodium. Vegetables Canned vegetables. Frozen vegetables with sauce or seasonings. Creamed vegetables. [place] fries. Onion rings. Pickled vegetables and sauerkraut. Grains Bread with more than 80 mg of sodium per slice. Hot or cold cereal with more than 140 mg sodium per serving. Salted pretzels and crackers. Prepackaged breadcrumbs. Bagels, croissants, and biscuits. Meats and other protein foods Ribs and chicken wings. Bacon, ham, pepperoni, bologna, salami, and packaged luncheon meats. Hot dogs, bratwurst, and sausage. Canned meat. Smoked meat and fish. Salted nuts and seeds. Dairy Whole milk, half-and-half, and cream. Buttermilk. Processed cheese, cheese spreads, and cheese curds. Regular cottage cheese. Feta cheese. Shredded cheese. String cheese. Fats and oils Butter, lard, shortening, ghee, and bacon fat. Canned and packaged gravies. Seasoning and other foods Onion salt, garlic salt, table salt, and sea salt. Marinades. Regular salad dressings. Relishes, pickles, and olives. Meat flavorings and tenderizers, and bouillon cubes. Horseradish, ketchup, and mustard. Worcestershire sauce. Teriyaki sauce, soy sauce (including reduced sodium). Hot sauce and Tabasco sauce. Steak sauce, fish sauce, oyster sauce, and cocktail sauce. Taco seasonings. Barbecue sauce. Tartar sauce. The items listed above may not be all  the foods and drinks you should avoid. Talk with a dietitian to learn more. This information is not intended to replace advice given to you by your health care provider. Make sure you discuss any questions you have with your health care provider. Document Revised: 02/18/2023 Document Reviewed: 02/07/2023 Elsevier Patient Education  2024 [employer].

## 2024-01-28 NOTE — Patient Outreach (Signed)
 Complex Care Management   Visit Note  01/28/2024  Name:  Cindy Meza MRN: [id number] DOB: 1935/11/27  Situation: Referral received for Complex Care Management related to Heart Failure I obtained verbal consent from Patient.  Visit completed with patient  on the phone  Background:   Past Medical History:  Diagnosis Date   Abdominal pain 03/30/2015   Dr Avram     /16, 1/21 ?fosamax related  S/p gyn eval, abd CT (-)  11/16 50% better  Better off Fosamax, off Miralax    12/22 Recurrent abd pain - pt saw Dr Avram; CT w/constipation - using Miralax  and Benefiber - better now     Allergy    Anemia    Asthma    Dr. Darlean   Blood transfusion without reported diagnosis    Breast cyst    Left   Cataract    Dr. Roz   Cerumen impaction 04/06/2016   9/17 L, 10/19 B     Chest pressure 07/22/2012   1/14, 8/17  Recurrent - ?asthma related (11/17)  Advair MDI - use qd, not prn  We added Singulair   Cardiac w/up was (-) - --- Dr Okey     Closed right hip fracture (HCC) 06/12/2016   Dr Dozier  R hip fx - intermedullary nail 06/13/17 s/p   On Prolia , calcium , Vit D        Complication of anesthesia    ' I HAD HALLUCINATIONS WHEN THEY DID MY HIP    GERD (gastroesophageal reflux disease)    Heart murmur    Hemorrhoids    History of colonic polyps Gessner   hx of adenomas (small) 1998   Hot flash, menopausal 02/15/2012   Ongoing   D/c estrogens due to breast abnormalities 2013     Nausea 02/12/2019   8/20 d/c Midodrin, Florinef   Occasional.   Nausea after taking Pantoprazole  - take w/food     OA (osteoarthritis)    Orthostatic hypotension    Osteopenia    Osteoporosis    PAC (premature atrial contraction)    Upper respiratory infection 06/13/2023   Zpac if worse     Vitamin D  deficiency     Assessment: patient attended gastroenterology visit regarding intermittent nausea. Patient reports was prescribed Pepcid , but states she does not like to take too much medication and will  discuss with upcoming PCP appointment. Patient denies any other concerns at this time  Patient Reported Symptoms:  Cognitive Cognitive Status: Alert and oriented to person, place, and time, Insightful and able to interpret abstract concepts, Normal speech and language skills      Neurological Neurological Review of Symptoms: No symptoms reported    HEENT HEENT Symptoms Reported: No symptoms reported      Cardiovascular Cardiovascular Symptoms Reported: No symptoms reported Other Cardiovascular Symptoms: patient denies any signs/symptoms HF exacerbation. follow up with cardiology on 03/06/24. Does patient have uncontrolled Hypertension?: No Is patient checking Blood Pressure at home?: Yes Patient's Recent BP reading at home: Paitent reports BP stable last checked: BP 118/67 last checked 01/17/24. Cardiovascular Management Strategies: Medical device, Medication therapy, Routine screening, Fluid modification, Activity, Adequate rest, Coping strategies, Diet modification, Weight management Do You Have a Working Readable Scale?: Yes Weight: 82 lb (37.2 kg)  Respiratory Respiratory Symptoms Reported: No symptoms reported    Endocrine Endocrine Symptoms Reported: No symptoms reported Is patient diabetic?: No    Gastrointestinal Gastrointestinal Symptoms Reported: Nausea Additional Gastrointestinal Details: Paitent reports intermittent nausea. saw gastroenterologist on 01/22/24 and prescribed  pepcid . patient states she has not started and will discuss with PCP at upcoming visit next week. Patient adds, she does not like to take too much medication. Confirmed with patient she is scheduled for CT abd/pelvis tomorrow.      Genitourinary Genitourinary Symptoms Reported: No symptoms reported    Integumentary Integumentary Symptoms Reported: No symptoms reported    Musculoskeletal Musculoskelatal Symptoms Reviewed: No symptoms reported        Psychosocial Psychosocial Symptoms Reported: No  symptoms reported            01/01/2024   12:04 PM  Depression screen PHQ 2/9  Decreased Interest 0  Down, Depressed, Hopeless 0  PHQ - 2 Score 0    Vitals:   01/28/24 1155  BP: 118/67    Medications Reviewed Today     Reviewed by Kyreese Chio M, RN (Registered Nurse) on 01/28/24 at 1153  Med List Status: <None>   Medication Order Taking? Sig Documenting Provider Last Dose Status Informant  albuterol  (PROVENTIL  HFA) 108 (90 Base) MCG/ACT inhaler [id number] Yes Inhale 1-2 puffs into the lungs every 4 (four) hours as needed for wheezing or shortness of breath. [name], Aleksei V, MD  Active Self, Pharmacy Records  aspirin  81 MG tablet [id number] Yes Take 81 mg by mouth daily. [provider]  Active Self, Pharmacy Records  budesonide -formoterol  (SYMBICORT ) 80-4.5 MCG/ACT inhaler [id number] Yes Inhale 2 puffs into the lungs 2 (two) times daily. [name], Aleksei V, MD  Active Self, Pharmacy Records  calcium  carbonate (TUMS) 500 MG chewable tablet [id number] Yes Chew 1 tablet by mouth 3 (three) times daily. [provider]  Active Self  Calcium -Magnesium-Vitamin D  (CALCIUM  500 PO) [id number] Yes Take 1 tablet by mouth 2 (two) times daily with a meal. With lunch and supper   Active Self, Pharmacy Records  [employer] OMEGA-3 KRILL OIL 500 MG CAPS [id number] Yes Take 500 capsules by mouth daily. [provider]  Active Self, Pharmacy Records  Cyanocobalamin (VITAMIN B-12) 1000 MCG SUBL [id number] Yes Place 1 tablet (1,000 mcg total) under the tongue in the morning. Cara [name], NP  Active Self, Pharmacy Records  estradiol  (ESTRACE ) 0.1 MG/GM vaginal cream [id number] Yes Place vaginally. [provider]  Active            Med Note [name], [name]   Wed Dec 04, 2023 11:38 AM) Reports uses twice a week as needed.  famotidine  (PEPCID ) 20 MG tablet [id number]  Take 1 tablet (20 mg total) by mouth daily.  Patient not taking: Reported on 01/28/2024   Beather Delon Gibson, [place]  Active   furosemide  (LASIX ) 20 MG tablet [id number] Yes Take 1 tablet (20 mg total) by mouth daily as needed for edema (for weight gain 2 to 3 lbs in 24 hrs or 5 lbs in 7 days.). Arrien, Elidia Sieving, MD  Active   LORazepam  (ATIVAN ) 0.5 MG tablet [id number] Yes Take 0.5-1 tablets (0.25-0.5 mg total) by mouth at bedtime as needed for anxiety (insomnia, overactive bladder). [name], Aleksei V, MD  Active Self, Pharmacy Records           Med Note Ernest, Haworth D   Tue Jul 30, 2023  1:55 PM) unknown  metoprolol  succinate (TOPROL -XL) 25 MG 24 hr tablet [id number] Yes Take 0.5 tablets (12.5 mg total) by mouth at bedtime. [name], Aleksei V, MD  Active   nystatin ointment (MYCOSTATIN) [id number]  Apply 1 Application topically 2 (two) times daily.  Patient not taking: Reported on 01/28/2024  [provider]  Active   pantoprazole  (PROTONIX ) 20 MG tablet [id number] Yes Take 1 tablet (20 mg total) by mouth daily. [name], Aleksei V, MD  Active   polyethylene glycol (MIRALAX  / GLYCOLAX ) 17 g packet [id number] Yes Take 17 g by mouth daily. [provider]  Active Self, Pharmacy Records  Romosozumab -aqqg (EVENITY ) 105 MG/1. injection 210 mg [id number]   Shamleffer, Donell Cardinal, MD  Active            Med Note (TOUSEY, LAINE M   Tue Aug 13, 2023  1:07 PM) 08/13/23: Reports during [facility] 30-day program outreach call: she is no longer getting these injections at this point per endocrinologist instructions per 08/09/23 office visit  spironolactone  (ALDACTONE ) 25 MG tablet [id number] Yes Take 0.5 tablets (12.5 mg total) by mouth daily. Take with lunch [name], Aleksei V, MD  Active   Vitamin D -Vitamin K (VITAMIN K2-VITAMIN D3 PO) [id number] Yes Take 1 capsule by mouth daily. [provider]  Active Self  Wheat Dextrin (BENEFIBER DRINK MIX PO) [id number] Yes Take 1 Capful by mouth daily. [provider]  Active Self, Pharmacy Records           Recommendation:   Continue Current Plan of Care  Follow Up Plan:   Telephone follow up appointment date/time:  02/26/24  Heddy Shutter, RN, MSN, BSN, CCM [place]  [facility], Population Health Case Manager Phone: [phone]

## 2024-01-29 ENCOUNTER — Ambulatory Visit (HOSPITAL_COMMUNITY)
Admission: RE | Admit: 2024-01-29 | Discharge: 2024-01-29 | Disposition: A | Discharge status: Home / Self Care | Source: Ambulatory Visit | Attending: Physician Assistant | Admitting: Physician Assistant

## 2024-01-29 DIAGNOSIS — R634 Abnormal weight loss: Secondary | ICD-10-CM | POA: Diagnosis present

## 2024-01-29 DIAGNOSIS — R1030 Lower abdominal pain, unspecified: Secondary | ICD-10-CM | POA: Diagnosis present

## 2024-01-29 DIAGNOSIS — R110 Nausea: Secondary | ICD-10-CM | POA: Diagnosis present

## 2024-01-29 MED ORDER — IOHEXOL 300 MG/ML  SOLN
80.0000 mL | Freq: Once | INTRAMUSCULAR | Status: AC | PRN
Start: 2024-01-29 — End: 2024-01-29
  Administered 2024-01-29: 80 mL via INTRAVENOUS

## 2024-01-29 MED ORDER — IOHEXOL 9 MG/ML PO SOLN
ORAL | Status: AC
  Filled 2024-01-29: qty 1000

## 2024-01-29 MED ORDER — IOHEXOL 9 MG/ML PO SOLN
1000.0000 mL | ORAL | Status: AC
  Administered 2024-01-29: 1000 mL via ORAL

## 2024-02-04 NOTE — Telephone Encounter (Signed)
 Medical Buy and Bill  Prior Authorization REQUIRED for PROLIA   PA PROCESS DETAILS: Effective 07/09/2021 if the patient is new to Prolia , Prior authorization and Step  Therapy are required & not on file. Please go to [email or web address] or call [phone] to  initiate the prior authorization. For exception to the policy please visit  [email or web address]

## 2024-02-05 ENCOUNTER — Encounter: Payer: Self-pay | Admitting: Internal Medicine

## 2024-02-05 ENCOUNTER — Ambulatory Visit (INDEPENDENT_AMBULATORY_CARE_PROVIDER_SITE_OTHER): Admitting: Internal Medicine

## 2024-02-05 VITALS — BP 110/60 | HR 92 | Temp 97.9°F | Ht 61.0 in | Wt 86.2 lb

## 2024-02-05 DIAGNOSIS — E43 Unspecified severe protein-calorie malnutrition: Secondary | ICD-10-CM | POA: Diagnosis not present

## 2024-02-05 DIAGNOSIS — M810 Age-related osteoporosis without current pathological fracture: Secondary | ICD-10-CM

## 2024-02-05 DIAGNOSIS — N3281 Overactive bladder: Secondary | ICD-10-CM | POA: Diagnosis not present

## 2024-02-05 DIAGNOSIS — N1831 Chronic kidney disease, stage 3a: Secondary | ICD-10-CM

## 2024-02-05 DIAGNOSIS — R5383 Other fatigue: Secondary | ICD-10-CM | POA: Diagnosis not present

## 2024-02-05 NOTE — Assessment & Plan Note (Signed)
 Eat bananas daily

## 2024-02-05 NOTE — Progress Notes (Signed)
 Subjective:  Patient ID: Cindy Meza, female    DOB: August 17, 1935  Age: 88 y.o. MRN: [id number]  CC: Medical Management of Chronic Issues (Here for 2 month f/u: Asthma, Fatigue, GERD, Weight loss.)   HPI Cindy Meza presents for wt loss, constipation, asthma  Outpatient Medications Prior to Visit  Medication Sig Dispense Refill   albuterol  (PROVENTIL  HFA) 108 (90 Base) MCG/ACT inhaler Inhale 1-2 puffs into the lungs every 4 (four) hours as needed for wheezing or shortness of breath. 8.5 g 5   aspirin  81 MG tablet Take 81 mg by mouth daily.     budesonide -formoterol  (SYMBICORT ) 80-4.5 MCG/ACT inhaler Inhale 2 puffs into the lungs 2 (two) times daily. 1 each 11   calcium  carbonate (TUMS) 500 MG chewable tablet Chew 1 tablet by mouth 3 (three) times daily.     Calcium -Magnesium-Vitamin D  (CALCIUM  500 PO) Take 1 tablet by mouth 2 (two) times daily with a meal. With lunch and supper     [employer] OMEGA-3 KRILL OIL 500 MG CAPS Take 500 capsules by mouth daily.     Cyanocobalamin (VITAMIN B-12) 1000 MCG SUBL Place 1 tablet (1,000 mcg total) under the tongue in the morning. 100 tablet 3   estradiol  (ESTRACE ) 0.1 MG/GM vaginal cream Place vaginally.     famotidine  (PEPCID ) 20 MG tablet Take 1 tablet (20 mg total) by mouth daily. 30 tablet 5   furosemide  (LASIX ) 20 MG tablet Take 1 tablet (20 mg total) by mouth daily as needed for edema (for weight gain 2 to 3 lbs in 24 hrs or 5 lbs in 7 days.). 30 tablet 0   LORazepam  (ATIVAN ) 0.5 MG tablet Take 0.5-1 tablets (0.25-0.5 mg total) by mouth at bedtime as needed for anxiety (insomnia, overactive bladder). 30 tablet 2   metoprolol  succinate (TOPROL -XL) 25 MG 24 hr tablet Take 0.5 tablets (12.5 mg total) by mouth at bedtime. 45 tablet 3   pantoprazole  (PROTONIX ) 20 MG tablet Take 1 tablet (20 mg total) by mouth daily. 90 tablet 3   polyethylene glycol (MIRALAX  / GLYCOLAX ) 17 g packet Take 17 g by mouth daily.     spironolactone  (ALDACTONE ) 25 MG  tablet Take 0.5 tablets (12.5 mg total) by mouth daily. Take with lunch 45 tablet 3   Vitamin D -Vitamin K (VITAMIN K2-VITAMIN D3 PO) Take 1 capsule by mouth daily.     Wheat Dextrin (BENEFIBER DRINK MIX PO) Take 1 Capful by mouth daily.     nystatin ointment (MYCOSTATIN) Apply 1 Application topically 2 (two) times daily.     Facility-Administered Medications Prior to Visit  Medication Dose Route Frequency Provider Last Rate Last Admin   Romosozumab -aqqg (EVENITY ) 105 MG/1. injection 210 mg  210 mg Subcutaneous Once Shamleffer, Ibtehal Jaralla, MD        ROS: Review of Systems  Constitutional:  Positive for fatigue. Negative for activity change, appetite change, chills and unexpected weight change.  HENT:  Negative for congestion, mouth sores and sinus pressure.   Eyes:  Negative for visual disturbance.  Respiratory:  Negative for cough and chest tightness.   Gastrointestinal:  Negative for abdominal pain and nausea.  Genitourinary:  Negative for difficulty urinating, frequency and vaginal pain.  Musculoskeletal:  Negative for back pain and gait problem.  Skin:  Negative for pallor and rash.  Neurological:  Positive for dizziness and light-headedness. Negative for tremors, weakness, numbness and headaches.  Psychiatric/Behavioral:  Negative for confusion and sleep disturbance.     Objective:  BP 110/60 (  BP Location: Left Arm, Patient Position: Sitting, Cuff Size: Normal)   Pulse 92   Temp 97.9 F (36.6 C) (Oral)   Ht 5' 1 (1.549 m)   Wt 86 lb 4 oz (39.1 kg)   SpO2 98%   BMI 16.30 kg/m   BP Readings from Last 3 Encounters:  02/05/24 110/60  01/28/24 118/67  01/22/24 (!) 106/58    Wt Readings from Last 3 Encounters:  02/05/24 86 lb 4 oz (39.1 kg)  01/28/24 82 lb (37.2 kg)  01/22/24 85 lb 4 oz (38.7 kg)    Physical Exam Constitutional:      General: She is not in acute distress.    Appearance: She is well-developed. She is not toxic-appearing.  HENT:     Head:  Normocephalic.     Right Ear: External ear normal.     Left Ear: External ear normal.     Nose: Nose normal.  Eyes:     General:        Right eye: No discharge.        Left eye: No discharge.     Conjunctiva/sclera: Conjunctivae normal.     Pupils: Pupils are equal, round, and reactive to light.  Neck:     Thyroid : No thyromegaly.     Vascular: No JVD.     Trachea: No tracheal deviation.  Cardiovascular:     Rate and Rhythm: Normal rate and regular rhythm.     Heart sounds: Normal heart sounds.  Pulmonary:     Effort: No respiratory distress.     Breath sounds: No stridor. No wheezing.  Abdominal:     General: Bowel sounds are normal. There is no distension.     Palpations: Abdomen is soft. There is no mass.     Tenderness: There is no abdominal tenderness. There is no guarding or rebound.  Musculoskeletal:        General: No tenderness.     Cervical back: Normal range of motion and neck supple. No rigidity.  Lymphadenopathy:     Cervical: No cervical adenopathy.  Skin:    Findings: No erythema or rash.  Neurological:     Cranial Nerves: No cranial nerve deficit.     Motor: No abnormal muscle tone.     Coordination: Coordination normal.     Deep Tendon Reflexes: Reflexes normal.  Psychiatric:        Behavior: Behavior normal.        Thought Content: Thought content normal.        Judgment: Judgment normal.   Thin  Lab Results  Component Value Date   WBC 6.1 11/27/2023   HGB 11.7 (L) 11/27/2023   HCT 35.0 (L) 11/27/2023   PLT 193.0 11/27/2023   GLUCOSE 98 01/22/2024   CHOL 227 (H) 07/12/2021   TRIG 45 07/12/2021   HDL 107 07/12/2021   LDLDIRECT 104.0 03/17/2013   LDLCALC 112 (H) 07/12/2021   ALT 18 11/27/2023   AST 25 11/27/2023   NA 138 01/22/2024   K 4.1 01/22/2024   CL 102 01/22/2024   CREATININE 1.19 01/22/2024   BUN 27 (H) 01/22/2024   CO2 30 01/22/2024   TSH 1.580 09/18/2023   INR 1.04 06/12/2016   HGBA1C 5.0 12/27/2021    CT ABDOMEN PELVIS W  CONTRAST Result Date: 01/29/2024 CLINICAL DATA:  Weight loss, unintended EXAM: CT ABDOMEN AND PELVIS WITH CONTRAST TECHNIQUE: Multidetector CT imaging of the abdomen and pelvis was performed using the standard protocol following bolus administration of  intravenous contrast. RADIATION DOSE REDUCTION: This exam was performed according to the departmental dose-optimization program which includes automated exposure control, adjustment of the mA and/or kV according to patient size and/or use of iterative reconstruction technique. CONTRAST:  80mL OMNIPAQUE  IOHEXOL  300 MG/ML  SOLN COMPARISON:  CT of the abdomen pelvis performed May 23, 2021 FINDINGS: Lower chest: Enlarged heart. Hepatobiliary: No focal liver abnormality is seen. No gallstones, gallbladder wall thickening, or biliary dilatation. Pancreas: Unremarkable. No pancreatic ductal dilatation or surrounding inflammatory changes. Spleen: Normal in size without focal abnormality. Adrenals/Urinary Tract: Adrenal glands are within normal limits. No hydronephrosis or significant nephrolithiasis. Urinary bladder is grossly unremarkable. Stomach/Bowel: No dilated loops of bowel are seen. A moderate to large volume of stool material is present in the colon and rectum. Vascular/Lymphatic: No evidence of significant lymphadenopathy. Mild diffuse atherosclerotic changes in the abdominal aorta and major branch vessels. Reproductive: Status post hysterectomy. No adnexal masses. Other: Nothing significant. Musculoskeletal: Degenerative changes in the lumbar spine. Postsurgical changes from right hip internal fixation. IMPRESSION: 1. A moderate volume stool material is present throughout the colon. Correlate clinically for signs of constipation. 2. No CT evidence of intra-abdominal or intrapelvic mass. 3. Mild atherosclerotic changes in the abdominal aorta and major branch vessels. Electronically Signed   By: Maude Naegeli M.D.   On: 01/29/2024 15:24    Assessment & Plan:    Problem List Items Addressed This Visit     Fatigue - Primary   Multifactorial- better Treat constipation Monitor labs      OAB (overactive bladder)    OAB sx's Options discussed Lorazepam  prn at hs - low dose      Osteoporosis   Eat bananas daily      Protein-calorie malnutrition, severe   Protein drink daily      Stage 3a chronic kidney disease (CKD) (HCC)   Hydrate well, take fish oil Off Jardiance  09/2021         No orders of the defined types were placed in this encounter.     Follow-up: Return in about 3 months (around 05/07/2024) for a follow-up visit.  Marolyn Noel, MD

## 2024-02-05 NOTE — Assessment & Plan Note (Signed)
 Protein drink daily

## 2024-02-05 NOTE — Assessment & Plan Note (Signed)
 Hydrate well, take fish oil Off Jardiance 09/2021

## 2024-02-05 NOTE — Assessment & Plan Note (Signed)
OAB sx's Options discussed Lorazepam prn at hs - low dose

## 2024-02-05 NOTE — Assessment & Plan Note (Signed)
 Multifactorial- better Treat constipation Monitor labs

## 2024-02-05 NOTE — Progress Notes (Signed)
 Subjective:  Patient ID: Cindy Meza, female    DOB: March 21, 1936  Age: 88 y.o. MRN: [id number]  CC: Medical Management of Chronic Issues (Here for 2 month f/u: Asthma, Fatigue, GERD, Weight loss.)   HPI Cindy Meza presents for wt loss, constipation, asthma, GERD  Outpatient Medications Prior to Visit  Medication Sig Dispense Refill   albuterol  (PROVENTIL  HFA) 108 (90 Base) MCG/ACT inhaler Inhale 1-2 puffs into the lungs every 4 (four) hours as needed for wheezing or shortness of breath. 8.5 g 5   aspirin  81 MG tablet Take 81 mg by mouth daily.     budesonide -formoterol  (SYMBICORT ) 80-4.5 MCG/ACT inhaler Inhale 2 puffs into the lungs 2 (two) times daily. 1 each 11   calcium  carbonate (TUMS) 500 MG chewable tablet Chew 1 tablet by mouth 3 (three) times daily.     Calcium -Magnesium-Vitamin D  (CALCIUM  500 PO) Take 1 tablet by mouth 2 (two) times daily with a meal. With lunch and supper     [employer] OMEGA-3 KRILL OIL 500 MG CAPS Take 500 capsules by mouth daily.     Cyanocobalamin (VITAMIN B-12) 1000 MCG SUBL Place 1 tablet (1,000 mcg total) under the tongue in the morning. 100 tablet 3   estradiol  (ESTRACE ) 0.1 MG/GM vaginal cream Place vaginally.     famotidine  (PEPCID ) 20 MG tablet Take 1 tablet (20 mg total) by mouth daily. 30 tablet 5   furosemide  (LASIX ) 20 MG tablet Take 1 tablet (20 mg total) by mouth daily as needed for edema (for weight gain 2 to 3 lbs in 24 hrs or 5 lbs in 7 days.). 30 tablet 0   LORazepam  (ATIVAN ) 0.5 MG tablet Take 0.5-1 tablets (0.25-0.5 mg total) by mouth at bedtime as needed for anxiety (insomnia, overactive bladder). 30 tablet 2   metoprolol  succinate (TOPROL -XL) 25 MG 24 hr tablet Take 0.5 tablets (12.5 mg total) by mouth at bedtime. 45 tablet 3   pantoprazole  (PROTONIX ) 20 MG tablet Take 1 tablet (20 mg total) by mouth daily. 90 tablet 3   polyethylene glycol (MIRALAX  / GLYCOLAX ) 17 g packet Take 17 g by mouth daily.     spironolactone  (ALDACTONE )  25 MG tablet Take 0.5 tablets (12.5 mg total) by mouth daily. Take with lunch 45 tablet 3   Vitamin D -Vitamin K (VITAMIN K2-VITAMIN D3 PO) Take 1 capsule by mouth daily.     Wheat Dextrin (BENEFIBER DRINK MIX PO) Take 1 Capful by mouth daily.     nystatin ointment (MYCOSTATIN) Apply 1 Application topically 2 (two) times daily.     Facility-Administered Medications Prior to Visit  Medication Dose Route Frequency Provider Last Rate Last Admin   Romosozumab -aqqg (EVENITY ) 105 MG/1. injection 210 mg  210 mg Subcutaneous Once Shamleffer, Ibtehal Jaralla, MD        ROS: Review of Systems  Constitutional:  Positive for fatigue. Negative for activity change, appetite change, chills and unexpected weight change.  HENT:  Negative for congestion, mouth sores and sinus pressure.   Eyes:  Negative for visual disturbance.  Respiratory:  Negative for cough and chest tightness.   Cardiovascular:  Negative for leg swelling.  Gastrointestinal:  Negative for abdominal pain and nausea.  Genitourinary:  Negative for difficulty urinating, frequency and vaginal pain.  Musculoskeletal:  Positive for gait problem. Negative for arthralgias and back pain.  Skin:  Negative for pallor and rash.  Neurological:  Negative for dizziness, tremors, weakness, numbness and headaches.  Psychiatric/Behavioral:  Negative for behavioral problems, confusion, sleep disturbance  and suicidal ideas. The patient is not nervous/anxious.     Objective:  BP 110/60 (BP Location: Left Arm, Patient Position: Sitting, Cuff Size: Normal)   Pulse 92   Temp 97.9 F (36.6 C) (Oral)   Ht 5' 1 (1.549 m)   Wt 86 lb 4 oz (39.1 kg)   SpO2 98%   BMI 16.30 kg/m   BP Readings from Last 3 Encounters:  02/05/24 110/60  01/28/24 118/67  01/22/24 (!) 106/58    Wt Readings from Last 3 Encounters:  02/05/24 86 lb 4 oz (39.1 kg)  01/28/24 82 lb (37.2 kg)  01/22/24 85 lb 4 oz (38.7 kg)    Physical Exam Constitutional:      General:  She is not in acute distress.    Appearance: She is well-developed.  HENT:     Head: Normocephalic.     Right Ear: External ear normal.     Left Ear: External ear normal.     Nose: Nose normal.  Eyes:     General:        Right eye: No discharge.        Left eye: No discharge.     Conjunctiva/sclera: Conjunctivae normal.     Pupils: Pupils are equal, round, and reactive to light.  Neck:     Thyroid : No thyromegaly.     Vascular: No JVD.     Trachea: No tracheal deviation.  Cardiovascular:     Rate and Rhythm: Normal rate and regular rhythm.     Heart sounds: Normal heart sounds.  Pulmonary:     Effort: No respiratory distress.     Breath sounds: No stridor. No wheezing.  Abdominal:     General: Bowel sounds are normal. There is no distension.     Palpations: Abdomen is soft. There is no mass.     Tenderness: There is no abdominal tenderness. There is no guarding or rebound.  Musculoskeletal:        General: No tenderness.     Cervical back: Normal range of motion and neck supple. No rigidity.  Lymphadenopathy:     Cervical: No cervical adenopathy.  Skin:    Findings: No erythema or rash.  Neurological:     Cranial Nerves: No cranial nerve deficit.     Motor: No abnormal muscle tone.     Coordination: Coordination normal.     Deep Tendon Reflexes: Reflexes normal.  Psychiatric:        Behavior: Behavior normal.        Thought Content: Thought content normal.        Judgment: Judgment normal.     Lab Results  Component Value Date   WBC 6.1 11/27/2023   HGB 11.7 (L) 11/27/2023   HCT 35.0 (L) 11/27/2023   PLT 193.0 11/27/2023   GLUCOSE 98 01/22/2024   CHOL 227 (H) 07/12/2021   TRIG 45 07/12/2021   HDL 107 07/12/2021   LDLDIRECT 104.0 03/17/2013   LDLCALC 112 (H) 07/12/2021   ALT 18 11/27/2023   AST 25 11/27/2023   NA 138 01/22/2024   K 4.1 01/22/2024   CL 102 01/22/2024   CREATININE 1.19 01/22/2024   BUN 27 (H) 01/22/2024   CO2 30 01/22/2024   TSH 1.580  09/18/2023   INR 1.04 06/12/2016   HGBA1C 5.0 12/27/2021    CT ABDOMEN PELVIS W CONTRAST Result Date: 01/29/2024 CLINICAL DATA:  Weight loss, unintended EXAM: CT ABDOMEN AND PELVIS WITH CONTRAST TECHNIQUE: Multidetector CT imaging of the abdomen  and pelvis was performed using the standard protocol following bolus administration of intravenous contrast. RADIATION DOSE REDUCTION: This exam was performed according to the departmental dose-optimization program which includes automated exposure control, adjustment of the mA and/or kV according to patient size and/or use of iterative reconstruction technique. CONTRAST:  80mL OMNIPAQUE  IOHEXOL  300 MG/ML  SOLN COMPARISON:  CT of the abdomen pelvis performed May 23, 2021 FINDINGS: Lower chest: Enlarged heart. Hepatobiliary: No focal liver abnormality is seen. No gallstones, gallbladder wall thickening, or biliary dilatation. Pancreas: Unremarkable. No pancreatic ductal dilatation or surrounding inflammatory changes. Spleen: Normal in size without focal abnormality. Adrenals/Urinary Tract: Adrenal glands are within normal limits. No hydronephrosis or significant nephrolithiasis. Urinary bladder is grossly unremarkable. Stomach/Bowel: No dilated loops of bowel are seen. A moderate to large volume of stool material is present in the colon and rectum. Vascular/Lymphatic: No evidence of significant lymphadenopathy. Mild diffuse atherosclerotic changes in the abdominal aorta and major branch vessels. Reproductive: Status post hysterectomy. No adnexal masses. Other: Nothing significant. Musculoskeletal: Degenerative changes in the lumbar spine. Postsurgical changes from right hip internal fixation. IMPRESSION: 1. A moderate volume stool material is present throughout the colon. Correlate clinically for signs of constipation. 2. No CT evidence of intra-abdominal or intrapelvic mass. 3. Mild atherosclerotic changes in the abdominal aorta and major branch vessels.  Electronically Signed   By: Maude Naegeli M.D.   On: 01/29/2024 15:24    Assessment & Plan:   Problem List Items Addressed This Visit     Fatigue - Primary   Multifactorial- better Treat constipation Monitor labs      OAB (overactive bladder)    OAB sx's Options discussed Lorazepam  prn at hs - low dose      Osteoporosis   Eat bananas daily      Protein-calorie malnutrition, severe   Protein drink daily      Stage 3a chronic kidney disease (CKD) (HCC)   Hydrate well, take fish oil Off Jardiance  09/2021         No orders of the defined types were placed in this encounter.     Follow-up: Return in about 3 months (around 05/07/2024) for a follow-up visit.  Marolyn Noel, MD

## 2024-02-06 NOTE — Telephone Encounter (Signed)
 [employer]  Patient is ready for scheduling on or after 02/06/24  Out-of-pocket cost due at time of visit: $0  Primary: [employer] Medicare Advantage  L-PPO Prolia  co-insurance: 20% (approximately $331.87) Admin fee co-insurance: 20% (approximately $25)  Deductible: $200 of $200 met  Prior Auth: APPROVED PA# [id number] Valid: 02/06/24-02/05/25  Secondary: N/A Prolia  co-insurance:  Admin fee co-insurance:  Deductible:  Prior Auth:  PA# Valid:   ** This summary of benefits is an estimation of the patient's out-of-pocket cost. Exact cost may vary based on individual plan coverage.

## 2024-02-06 NOTE — Telephone Encounter (Signed)
 Prior Authorization initiated for PROLIA  via [facility] Provider portal.  Case ID: [id number]   [employer]  Prior Authorization APPROVED PA# [id number] Valid: 02/06/24-02/05/25

## 2024-02-07 ENCOUNTER — Ambulatory Visit (INDEPENDENT_AMBULATORY_CARE_PROVIDER_SITE_OTHER): Payer: Medicare Other | Admitting: Internal Medicine

## 2024-02-07 ENCOUNTER — Encounter: Payer: Self-pay | Admitting: Internal Medicine

## 2024-02-07 VITALS — BP 104/70 | HR 85 | Ht 61.0 in | Wt 84.4 lb

## 2024-02-07 DIAGNOSIS — M810 Age-related osteoporosis without current pathological fracture: Secondary | ICD-10-CM

## 2024-02-07 NOTE — Progress Notes (Signed)
 Name: Cindy Meza  MRN/ DOB: [id number], [birth year]    Age/ Sex: 88 y.o., female     PCP: Garald [name], MD   Reason for Endocrinology Evaluation: Osteoporosis     Initial Endocrinology Clinic Visit: 03/10/2020    PATIENT IDENTIFIER: [name] is a 88 y.o., female with a past medical history of osteoporosis, asthma, and CAD. She has followed with [facility] since 03/10/2020 for consultative assistance with management of her osteoporosis.   HISTORICAL SUMMARY:   Pt was diagnosed with osteoporosis: 2018   Menarche at age : 24 Menopausal at age : in her 47's  Fracture Hx: Right hip fracture  Hx of HRT: yes  FH of osteoporosis or hip fracture: no Prior Hx of anti-estrogenic therapy : no  Prior Hx of anti-resorptive therapy : Intolerant to Fosamax - heel pain, many years ago. Restarted ~ 2020 but stopped in 2021 due to abdominal pain.  She has been on risedronate with knee pain  First Prolia  injection 05/05/2020 until 11/30/2022  Due to worsening BMD , we started Evenity  04/23/2023 and received November and December doses.  Evenity  was put on hold after her admission for CHF and worsening GFR   SUBJECTIVE:    Today (02/07/2024):  [name] is here for osteoporosis .  She is accompanied by her niece Laureen   Last Evenity   injection 07/05/2023 She continues to follow-up with cardiology for severe mitral regurgitation and CHF, not a candidate for mitral transcatheter repair, or medical therapy  No SOB , Symbicort  is helping  NO chest pain  Denies recent falls  Denies recent bone fractures  Denies recent  nausea or vomiting She does have constipation - taking miralax     Tums 2 tums a day  Vitamin D3 - K2 1000 iu daily     HISTORY:  Past Medical History:  Past Medical History:  Diagnosis Date   Abdominal pain 03/30/2015   Dr Avram     /16, 1/21 ?fosamax related  S/p gyn eval, abd CT (-)  11/16 50% better  Better off Fosamax,  off Miralax    12/22 Recurrent abd pain - pt saw Dr Avram; CT w/constipation - using Miralax  and Benefiber - better now     Allergy    Anemia    Asthma    Dr. Darlean   Blood transfusion without reported diagnosis    Breast cyst    Left   Cataract    Dr. Roz   Cerumen impaction 04/06/2016   9/17 L, 10/19 B     Chest pressure 07/22/2012   1/14, 8/17  Recurrent - ?asthma related (11/17)  Advair MDI - use qd, not prn  We added Singulair   Cardiac w/up was (-) - --- Dr Okey     Closed right hip fracture (HCC) 06/12/2016   Dr Dozier  R hip fx - intermedullary nail 06/13/17 s/p   On Prolia , calcium , Vit D        Complication of anesthesia    ' I HAD HALLUCINATIONS WHEN THEY DID MY HIP    GERD (gastroesophageal reflux disease)    Heart murmur    Hemorrhoids    History of colonic polyps Gessner   hx of adenomas (small) 1998   Hot flash, menopausal 02/15/2012   Ongoing   D/c estrogens due to breast abnormalities 2013     Nausea 02/12/2019   8/20 d/c Midodrin, Florinef   Occasional.   Nausea after taking Pantoprazole  - take w/food  OA (osteoarthritis)    Orthostatic hypotension    Osteopenia    Osteoporosis    PAC (premature atrial contraction)    Upper respiratory infection 06/13/2023   Zpac if worse     Vitamin D  deficiency    Past Surgical History:  Past Surgical History:  Procedure Laterality Date   ABDOMINAL HYSTERECTOMY     complete   APPENDECTOMY     BREAST CYST EXCISION     CATARACT EXTRACTION, BILATERAL     04/23/18 left eye, 04/02/18 right eye   COLONOSCOPY  Multiple   Adenomatous colon polyps   ESOPHAGOGASTRODUODENOSCOPY  2010   INTRAMEDULLARY (IM) NAIL INTERTROCHANTERIC Right 06/13/2016   Procedure: INTRAMEDULLARY (IM) NAIL INTERTROCHANTRIC;  Surgeon: Eva Herring, MD;  Location: [facility] OR;  Service: Orthopedics;  Laterality: Right;   RIGHT/LEFT HEART CATH AND CORONARY ANGIOGRAPHY N/A 07/31/2023   Procedure: RIGHT/LEFT HEART CATH AND CORONARY ANGIOGRAPHY;   Surgeon: Darron Deatrice LABOR, MD;  Location: [facility] INVASIVE CV LAB;  Service: Cardiovascular;  Laterality: N/A;   TONSILLECTOMY     Social History:  reports that she has never smoked. She has never used smokeless tobacco. She reports that she does not currently use alcohol. She reports that she does not use drugs. Family History:  Family History  Problem Relation Age of Onset   Diabetes Mother 83       Deceased   Hypertension Other    Colon cancer Neg Hx    Pancreatic cancer Neg Hx    Stomach cancer Neg Hx    Esophageal cancer Neg Hx    Rectal cancer Neg Hx      HOME MEDICATIONS: Allergies as of 02/07/2024       Reactions   Fosamax [alendronate Sodium]    Paresthesias, abd pain   Gabapentin     dizziness   Lidocaine  Swelling   Of face and lips.  Novocaine does same thing.   Midodrine     Nausea   Risedronate Sodium    Knee pain   Shellfish Allergy Hives   Statins    REACTION: pains        Medication List        Accurate as of February 07, 2024  1:53 PM. If you have any questions, ask your nurse or doctor.          albuterol  108 (90 Base) MCG/ACT inhaler Commonly known as: Proventil  HFA Inhale 1-2 puffs into the lungs every 4 (four) hours as needed for wheezing or shortness of breath.   aspirin  81 MG tablet Take 81 mg by mouth daily.   BENEFIBER DRINK MIX PO Take 1 Capful by mouth daily.   budesonide -formoterol  80-4.5 MCG/ACT inhaler Commonly known as: SYMBICORT  Inhale 2 puffs into the lungs 2 (two) times daily.   CALCIUM  500 PO Take 1 tablet by mouth 2 (two) times daily with a meal. With lunch and supper   CVS Omega-3 Krill Oil 500 MG Caps Take 500 capsules by mouth daily.   estradiol  0.1 MG/GM vaginal cream Commonly known as: ESTRACE  Place vaginally.   famotidine  20 MG tablet Commonly known as: PEPCID  Take 1 tablet (20 mg total) by mouth daily.   furosemide  20 MG tablet Commonly known as: LASIX  Take 1 tablet (20 mg total) by mouth daily as needed  for edema (for weight gain 2 to 3 lbs in 24 hrs or 5 lbs in 7 days.).   LORazepam  0.5 MG tablet Commonly known as: ATIVAN  Take 0.5-1 tablets (0.25-0.5 mg total) by mouth at bedtime  as needed for anxiety (insomnia, overactive bladder).   metoprolol  succinate 25 MG 24 hr tablet Commonly known as: TOPROL -XL Take 0.5 tablets (12.5 mg total) by mouth at bedtime.   pantoprazole  20 MG tablet Commonly known as: Protonix  Take 1 tablet (20 mg total) by mouth daily.   polyethylene glycol 17 g packet Commonly known as: MIRALAX  / GLYCOLAX  Take 17 g by mouth daily.   spironolactone  25 MG tablet Commonly known as: ALDACTONE  Take 0.5 tablets (12.5 mg total) by mouth daily. Take with lunch   Tums 500 MG chewable tablet Generic drug: calcium  carbonate Chew 1 tablet by mouth 3 (three) times daily.   Vitamin B-12 1000 MCG Subl Place 1 tablet (1,000 mcg total) under the tongue in the morning.   VITAMIN K2-VITAMIN D3 PO Take 1 capsule by mouth daily.          OBJECTIVE:   PHYSICAL EXAM: VS: BP 104/70 (BP Location: Right Arm, Patient Position: Sitting, Cuff Size: Small)   Pulse 85   Ht 5' 1 (1.549 m)   Wt 84 lb 6.4 oz (38.3 kg)   SpO2 99%   BMI 15.95 kg/m    EXAM: General: Pt appears well and is in NAD  Lungs: Clear with good BS bilat   Heart: Auscultation: RRR.  Extremities:  BL LE: No pretibial edema normal   Mental Status: Judgment, insight: Intact Orientation: Oriented to time, place, and person Mood and affect: No depression, anxiety, or agitation     DATA REVIEWED:   Latest Reference Range & Units 01/22/24 12:02  Sodium 135 - 145 mEq/L 138  Potassium 3.5 - 5.1 mEq/L 4.1  Chloride 96 - 112 mEq/L 102  CO2 19 - 32 mEq/L 30  Glucose 70 - 99 mg/dL 98  BUN 6 - 23 mg/dL 27 (H)  Creatinine 9.59 - 1.20 mg/dL 8.80  Calcium  8.4 - 10.5 mg/dL 89.9  GFR >39.99 mL/min 41.55 (L)     DXA 11/08/2022 @ [facility]   11/08/2022 Change 2023  AP  -3.0 Down 10%  LFN -2.3   Left TH -2.0  Down 2%  1/3rd radius -4.3 Down 6%   Old records , labs and images have been reviewed.     ASSESSMENT / PLAN / RECOMMENDATIONS:   1. Osteoporosis :  -Patient with reported intolerance to alendronate and risedronate -She was on prolia  04/2020 until 11/2022, we switched to Evenity  due to worsening BMD, she received 3 doses 04/2023,05/2023 and 06/2023, which I discontinued due to to hospitalization for CHF and severe mitral stenosis -I have recommended no treatment at this time as the risk outweighs the benefit - Emphasized the importance of optimizing calcium , vitamin D  intake and weightbearing exercises.  I have offered to refer her to PT for weightbearing exercises but she would like to hold off on this - She has not been able to swallow tablets and has switched to Tums as a source for calcium , I did advise the patient to increase Tums as below - Will repeat DXA scan next year and consider Reclast infusion   Medications  Take 2 Tums  twice daily Continue Vitamin D  1000 iu daily    Follow-up after bone density   I spent 25 minutes preparing to see the patient by review of recent labs, imaging and procedures, obtaining and reviewing separately obtained history, communicating with the patient/family or caregiver, ordering medications, tests or procedures, and documenting clinical information in the EHR including the differential Dx, treatment, and any further evaluation and other  management   Signed electronically by: Stefano Redgie Butts, MD  [facility]  [facility] [address] [place], [place] [address] Phone: [phone] FAX: [phone]      CC: Garald [name], MD [address] [place] [address] Phone: [phone]  Fax: [phone]   Return to Endocrinology clinic as below: Future Appointments  Date Time Provider Department Center  02/26/2024 11:30 AM Prentiss [name], RN [facility] None  03/06/2024  3:35 PM  Devora Prom D, NP [facility]  03/25/2024  2:10 PM [name], MD [facility]  05/13/2024  2:20 PM [name], [name], MD [facility] None  08/28/2024  1:40 PM [facility]-ANNUAL WELLNESS VISIT 2 [facility] None

## 2024-02-07 NOTE — Patient Instructions (Addendum)
 Take Tums 2 tablets twice a day

## 2024-02-08 NOTE — Telephone Encounter (Signed)
 Per visit note 02/07/24:  1. Osteoporosis :   -Patient with reported intolerance to alendronate and risedronate -She was on prolia  04/2020 until 11/2022, we switched to Evenity  due to worsening BMD, she received 3 doses 04/2023,05/2023 and 06/2023, which I discontinued due to to hospitalization for CHF and severe mitral stenosis -I have recommended no treatment at this time as the risk outweighs the benefit - Emphasized the importance of optimizing calcium , vitamin D  intake and weightbearing exercises.  I have offered to refer her to PT for weightbearing exercises but she would like to hold off on this - She has not been able to swallow tablets and has switched to Tums as a source for calcium , I did advise the patient to increase Tums as below - Will repeat DXA scan next year and consider Reclast infusion     Medications  Take 2 Tums  twice daily Continue Vitamin D  1000 iu daily      Follow-up after bone density

## 2024-02-08 NOTE — Telephone Encounter (Signed)
Pt archived in [email or web address].  Please advise if patient and/or provider wish to proceed with Prolia therpay.  

## 2024-02-16 NOTE — Telephone Encounter (Signed)
 Per visit note 02/07/24:   ASSESSMENT / PLAN / RECOMMENDATIONS:    1. Osteoporosis :   -Patient with reported intolerance to alendronate and risedronate -She was on prolia  04/2020 until 11/2022, we switched to Evenity  due to worsening BMD, she received 3 doses 04/2023,05/2023 and 06/2023, which I discontinued due to to hospitalization for CHF and severe mitral stenosis -I have recommended no treatment at this time as the risk outweighs the benefit - Emphasized the importance of optimizing calcium , vitamin D  intake and weightbearing exercises.  I have offered to refer her to PT for weightbearing exercises but she would like to hold off on this - She has not been able to swallow tablets and has switched to Tums as a source for calcium , I did advise the patient to increase Tums as below - Will repeat DXA scan next year and consider Reclast infusion     Medications  Take 2 Tums  twice daily Continue Vitamin D  1000 iu daily

## 2024-02-26 ENCOUNTER — Other Ambulatory Visit: Payer: Self-pay

## 2024-02-26 NOTE — Patient Instructions (Signed)
 Visit Information  Thank you for taking time to visit with me today. Please don't hesitate to contact me if I can be of assistance to you before our next scheduled appointment.  Your next care management appointment is by telephone on 04/08/24 at 11:30 am  Please call the care guide team at [phone] if you need to cancel, schedule, or reschedule an appointment.   Please call the Suicide and Crisis Lifeline: 988 call the [place]  National Suicide Prevention Lifeline: (614) 764-8806 or TTY: 714-463-8308 TTY 808 590 6814) to talk to a trained counselor call 1-800-273-TALK (toll free, 24 hour hotline) if you are experiencing a Mental Health or Behavioral Health Crisis or need someone to talk to.  Heddy Shutter, RN, MSN, BSN, CCM [place]  [facility], Population Health Case Manager Phone: [phone]

## 2024-02-26 NOTE — Patient Outreach (Signed)
 Complex Care Management   Visit Note  02/26/2024  Name:  Cindy Meza MRN: [id number] DOB: 10-Nov-1935  Situation: Referral received for Complex Care Management related to Heart Failure I obtained verbal consent from Patient.  Visit completed with Patient  on the phone  Background:   Past Medical History:  Diagnosis Date   Abdominal pain 03/30/2015   Dr Avram     /16, 1/21 ?fosamax related  S/p gyn eval, abd CT (-)  11/16 50% better  Better off Fosamax, off Miralax    12/22 Recurrent abd pain - pt saw Dr Avram; CT w/constipation - using Miralax  and Benefiber - better now     Allergy    Anemia    Asthma    Dr. Darlean   Blood transfusion without reported diagnosis    Breast cyst    Left   Cataract    Dr. Roz   Cerumen impaction 04/06/2016   9/17 L, 10/19 B     Chest pressure 07/22/2012   1/14, 8/17  Recurrent - ?asthma related (11/17)  Advair MDI - use qd, not prn  We added Singulair   Cardiac w/up was (-) - --- Dr Okey     Closed right hip fracture (HCC) 06/12/2016   Dr Dozier  R hip fx - intermedullary nail 06/13/17 s/p   On Prolia , calcium , Vit D        Complication of anesthesia    ' I HAD HALLUCINATIONS WHEN THEY DID MY HIP    GERD (gastroesophageal reflux disease)    Heart murmur    Hemorrhoids    History of colonic polyps Gessner   hx of adenomas (small) 1998   Hot flash, menopausal 02/15/2012   Ongoing   D/c estrogens due to breast abnormalities 2013     Nausea 02/12/2019   8/20 d/c Midodrin, Florinef   Occasional.   Nausea after taking Pantoprazole  - take w/food     OA (osteoarthritis)    Orthostatic hypotension    Osteopenia    Osteoporosis    PAC (premature atrial contraction)    Upper respiratory infection 06/13/2023   Zpac if worse     Vitamin D  deficiency    Assessment: Patient denies any signs/symptoms of HF exacerbation and continues to check weights and record.  Patient reports CT abdomen on 02/05/24 re: abdominal pain per patient showed  constipation.  patient reports she is taking Miralax  and she is having BM at least every day and if not every day then every other day. Patient does not attribute her intermittant nasea to constipation, But she reports some improvement in her nausea. Patient with questions regarding if she should still be taking ASA 81 mg and states will discuss at cardiology visit next week.  Patient Reported Symptoms:  Cognitive Cognitive Status: Insightful and able to interpret abstract concepts      Neurological Neurological Review of Symptoms: No symptoms reported    HEENT HEENT Symptoms Reported: No symptoms reported      Cardiovascular Cardiovascular Symptoms Reported: No symptoms reported Weight: 83 lb 8 oz (37.9 kg)  Respiratory Respiratory Symptoms Reported: No symptoms reported    Endocrine Endocrine Symptoms Reported: No symptoms reported Is patient diabetic?: No    Gastrointestinal Gastrointestinal Symptoms Reported: Nausea Additional Gastrointestinal Details: per CT abdomen-constipation. patient reports she is taking Miralax  and she is having BM at least every day and if not every day then every other day. Patient does not attribute her intermittant nasea to constipation, But she reports some improvement in  her nausea. Gastrointestinal Management Strategies: Medication therapy    Genitourinary Genitourinary Symptoms Reported: No symptoms reported    Integumentary Integumentary Symptoms Reported: No symptoms reported    Musculoskeletal Musculoskelatal Symptoms Reviewed: No symptoms reported        Psychosocial Psychosocial Symptoms Reported: No symptoms reported         There were no vitals filed for this visit.  Medications Reviewed Today     Reviewed by Mikalyn Hermida M, RN (Registered Nurse) on 02/26/24 at 1238  Med List Status: <None>   Medication Order Taking? Sig Documenting Provider Last Dose Status Informant  albuterol  (PROVENTIL  HFA) 108 (90 Base) MCG/ACT inhaler  [id number] Yes Inhale 1-2 puffs into the lungs every 4 (four) hours as needed for wheezing or shortness of breath. [name], Aleksei V, MD  Active Self, Pharmacy Records  aspirin  81 MG tablet [id number] Yes Take 81 mg by mouth daily. [provider]  Active Self, Pharmacy Records  budesonide -formoterol  (SYMBICORT ) 80-4.5 MCG/ACT inhaler [id number] Yes Inhale 2 puffs into the lungs 2 (two) times daily. [name], Aleksei V, MD  Active Self, Pharmacy Records  calcium  carbonate (TUMS) 500 MG chewable tablet [id number]  Chew 1 tablet by mouth 3 (three) times daily.  Patient taking differently: Chew 1 tablet by mouth 3 (three) times daily.   [provider]  Active Self  Calcium -Magnesium-Vitamin D  (CALCIUM  500 PO) [id number] Yes Take 1 tablet by mouth 2 (two) times daily with a meal. With lunch and supper  Patient taking differently: Take 1 tablet by mouth 2 (two) times daily with a meal. With lunch and supper     Active Self, Pharmacy Records  [employer] OMEGA-3 KRILL OIL 500 MG CAPS [id number] Yes Take 500 capsules by mouth daily. [provider]  Active Self, Pharmacy Records  Cyanocobalamin (VITAMIN B-12) 1000 MCG SUBL [id number] Yes Place 1 tablet (1,000 mcg total) under the tongue in the morning. Cara [name], NP  Active Self, Pharmacy Records  estradiol  (ESTRACE ) 0.1 MG/GM vaginal cream [id number] Yes Place vaginally. [provider]  Active            Med Note [name], [name]   Wed Dec 04, 2023 11:38 AM) Reports uses twice a week as needed.  famotidine  (PEPCID ) 20 MG tablet [id number] Yes Take 1 tablet (20 mg total) by mouth daily. Beather Delon Gibson, [place]  Active   furosemide  (LASIX ) 20 MG tablet [id number] Yes Take 1 tablet (20 mg total) by mouth daily as needed for edema (for weight gain 2 to 3 lbs in 24 hrs or 5 lbs in 7 days.). Arrien, Elidia Sieving, MD  Active   LORazepam  (ATIVAN ) 0.5 MG tablet [id number] Yes Take 0.5-1 tablets (0.25-0.5 mg total) by  mouth at bedtime as needed for anxiety (insomnia, overactive bladder). [name], Aleksei V, MD  Active Self, Pharmacy Records           Med Note [place], [place] D   Tue Jul 30, 2023  1:55 PM) unknown  metoprolol  succinate (TOPROL -XL) 25 MG 24 hr tablet [id number] Yes Take 0.5 tablets (12.5 mg total) by mouth at bedtime. [name], Aleksei V, MD  Active   pantoprazole  (PROTONIX ) 20 MG tablet [id number] Yes Take 1 tablet (20 mg total) by mouth daily. [name], Aleksei V, MD  Active   polyethylene glycol (MIRALAX  / GLYCOLAX ) 17 g packet [id number] Yes Take 17 g by mouth daily. [provider]  Active Self, Pharmacy Records  spironolactone  (ALDACTONE ) 25 MG tablet [id number] Yes Take 0.5 tablets (  12.5 mg total) by mouth daily. Take with lunch [name], Aleksei V, MD  Active   Vitamin D -Vitamin K (VITAMIN K2-VITAMIN D3 PO) [id number] Yes Take 1 capsule by mouth daily. [provider]  Active Self  Wheat Dextrin (BENEFIBER DRINK MIX PO) [id number]  Take 1 Capful by mouth daily.  Patient not taking: Reported on 02/26/2024   [provider]  Active Self, Pharmacy Records          Recommendation:   Continue Current Plan of Care  Follow Up Plan:   Telephone follow up appointment date/time:  04/08/24 at 11:30 am   Heddy Shutter, RN, MSN, BSN, CCM   [facility], Population Health Case Manager Phone: [phone]

## 2024-03-05 NOTE — Progress Notes (Unsigned)
 Cardiology Office Note    Date:  03/06/2024  ID:  Cindy Meza, Cindy Meza [birth year], MRN [id number] PCP:  Garald [name], MD  Cardiologist:  Vina Gull, MD  Electrophysiologist:  None   Chief Complaint: Follow-up for CAD and severe MR  History of Present Illness: .    Cindy Meza is a 88 y.o. female with visit-pertinent history of chronic systolic heart failure, orthostatic intolerance, severe MR, asthma, GERD and fatigue.  Patient has intolerances of multiple medications and diuresis and GDMT has been limited by orthostatic hypotension.  Patient's Myoview  in 2019 showed no evidence of ischemia.  Echocardiogram showed EF 50 to 55%, mild diastolic dysfunction, moderate MR.  Patient did not tolerate rosuvastatin  due to constipation.  Annual 07/2023 patient was admitted with heart failure exacerbation, echo showed EF 25% and severe MR.  Drop in EF was new from prior echo in 2019 which showed normal LVEF 50 to 55% and moderate MR.  She underwent left and right cardiac catheterization which showed severely elevated filling pressures, moderate to severe pulmonary hypertension and normal cardiac index.  NICM mid LAD 30% stenosed, ostial LAD to approximately 40% stenosed.  She has dilates 4 L with improvement in symptoms, escalation of GDMT was limited with orthostatic hypotension.  Patient was seen in clinic by Dr. Gull on 09/13/2023, her Florinef  and Jardiance  were discontinued at that time, she was continued on spironolactone .  She was referred to structural heart clinic for consideration of MitraClip.  She was seen by Dr. Wendel on 10/04/2023, noted that given her advanced age and frailty that she was not a candidate for mitral transcatheter edge-to-edge repair and on conversation with patient she was reluctant for any invasive procedure.  Today she presents for follow-up.  She reports that she has been doing well overall.  She denies any chest pain, shortness of breath, orthopnea or  PND.  She notes that her ankles will intermittently be mildly swollen, denies any significant changes.  She denies any palpitations or feeling of irregular heartbeats.  She feels that she is tolerating her medications overall well, notes that she has some intermittent nausea that has been ongoing and unchanged in recent months.  She denies any significant dizziness, lightheadedness, presyncope or syncope.  Labwork independently reviewed: 01/22/2024: Sodium 138, potassium 4.1, creatinine 1.19 ROS: .   Today she denies chest pain, shortness of breath, lower extremity edema, fatigue, palpitations, melena, hematuria, hemoptysis, diaphoresis, weakness, presyncope, syncope, orthopnea, and PND.  All other systems are reviewed and otherwise negative. Studies Reviewed: SABRA   EKG:  EKG is not ordered today.  CV Studies: Cardiac studies reviewed are outlined and summarized above. Otherwise please see EMR for full report. Cardiac Studies & Procedures   ______________________________________________________________________________________________ CARDIAC CATHETERIZATION  CARDIAC CATHETERIZATION 07/31/2023  Conclusion   Mid LAD lesion is 30% stenosed.   Ost LAD to Prox LAD lesion is 40% stenosed.  1.  Short left main coronary artery with moderate ostial LAD stenosis.  No evidence of obstructive disease overall. 2.  Left ventricular angiography was not performed.  EF was severely reduced by echo. 3.  Right heart catheterization showed severely elevated filling pressures, moderate to severe pulmonary hypertension and normal cardiac index.  RA: 13 mmHg RV: 53/18 mmHg PW: 35 mmHg with no large V waves. PA: 58/35 with a mean of 45 mmHg. LVEDP is 40 mmHg. Cardiac output: 3.19 with an index of 2.27.  Recommendations: The patient has nonischemic cardiomyopathy could be due to severe mitral regurgitation.  She is severely volume overloaded and spite of IV diuresis.  This will be continued for now. Consider  evaluation for mitral valve clip once heart failure is optimized.  However, considering her age and frail status, she might not be a candidate.  Findings Coronary Findings Diagnostic  Dominance: Co-dominant  Left Anterior Descending Ost LAD to Prox LAD lesion is 40% stenosed. The lesion is eccentric. Mid LAD lesion is 30% stenosed.  Left Circumflex Vessel is angiographically normal.  Left Posterior Atrioventricular Artery Vessel is angiographically normal.  Right Coronary Artery Vessel is small. Vessel is angiographically normal.  Right Posterior Descending Artery Vessel is small in size. Vessel is angiographically normal.  Intervention  No interventions have been documented.   STRESS TESTS  MYOCARDIAL PERFUSION IMAGING 06/26/2018  Interpretation Summary  Nuclear stress EF: 41%.  There was no ST segment deviation noted during stress.  The left ventricular ejection fraction is moderately decreased (30-44%).  This is a low risk study.  1. EF calculated as 41% but not sure gating was accurate.  Would suggest confirmation by echo. 2. No evidence for ischemia or infarction on perfusion images.  Overall, low risk study.   ECHOCARDIOGRAM  ECHOCARDIOGRAM COMPLETE 07/25/2023  Narrative ECHOCARDIOGRAM REPORT    Patient Name:   Cindy Meza Date of Exam: 07/25/2023 Medical Rec #:  [id number]       Height:       61.0 in Accession #:    [id number]      Weight:       98.0 lb Date of Birth:  08/05/1935       BSA:          1.395 m Patient Age:    87 years        BP:           122/70 mmHg Patient Gender: F               HR:           102 bpm. Exam Location:  [address]  Procedure: 2D Echo, 3D Echo, Cardiac Doppler and Color Doppler  Indications:    CHF-Acute Systolic I50.21  History:        Patient has prior history of Echocardiogram examinations, most recent 04/20/2016. CHF, Arrythmias:PAC; Signs/Symptoms:Dyspnea.  Sonographer:    Lauraine Pilot  RDCS Referring Phys: [id number] ALEKSEI V [name]  IMPRESSIONS   1. Left ventricular ejection fraction, by estimation, is 25%. Left ventricular ejection fraction by 3D volume is 33 %. The left ventricle has severely decreased function. The left ventricle demonstrates global hypokinesis with best preserved function at the lateral base. The left ventricular internal cavity size was mildly dilated. There is mild left ventricular hypertrophy. Left ventricular diastolic parameters are indeterminate. 2. Right ventricular systolic function is mildly reduced. The right ventricular size is normal. There is moderately elevated pulmonary artery systolic pressure. The estimated right ventricular systolic pressure is 50.8 mmHg. 3. Left atrial size was severely dilated. 4. Right atrial size was mildly dilated. 5. A small pericardial effusion is present. 6. The mitral valve is grossly normal. Severe mitral valve regurgitation. No evidence of mitral stenosis. 7. The aortic valve is grossly normal. There is mild thickening of the aortic valve. Aortic valve regurgitation is trivial. No aortic stenosis is present. 8. The inferior vena cava is normal in size with <50% respiratory variability, suggesting right atrial pressure of 8 mmHg.  FINDINGS Left Ventricle: Left ventricular ejection fraction, by estimation, is 25%. Left ventricular ejection fraction  by 3D volume is 33 %. The left ventricle has severely decreased function. The left ventricle demonstrates global hypokinesis. The left ventricular internal cavity size was mildly dilated. There is mild left ventricular hypertrophy. Left ventricular diastolic parameters are indeterminate.  Right Ventricle: The right ventricular size is normal. No increase in right ventricular wall thickness. Right ventricular systolic function is mildly reduced. There is moderately elevated pulmonary artery systolic pressure. The tricuspid regurgitant velocity is 3.27 m/s, and with an  assumed right atrial pressure of 8 mmHg, the estimated right ventricular systolic pressure is 50.8 mmHg.  Left Atrium: Left atrial size was severely dilated.  Right Atrium: Right atrial size was mildly dilated.  Pericardium: A small pericardial effusion is present.  Mitral Valve: The mitral valve is grossly normal. Severe mitral valve regurgitation. No evidence of mitral valve stenosis.  Tricuspid Valve: The tricuspid valve is normal in structure. Tricuspid valve regurgitation is mild . No evidence of tricuspid stenosis.  Aortic Valve: The aortic valve is grossly normal. There is mild thickening of the aortic valve. Aortic valve regurgitation is trivial. No aortic stenosis is present.  Pulmonic Valve: The pulmonic valve was normal in structure. Pulmonic valve regurgitation is trivial. No evidence of pulmonic stenosis.  Aorta: The aortic root is normal in size and structure. Ascending aorta measurements are within normal limits for age when indexed to body surface area.  Venous: The inferior vena cava is normal in size with less than 50% respiratory variability, suggesting right atrial pressure of 8 mmHg.  IAS/Shunts: No atrial level shunt detected by color flow Doppler.   LEFT VENTRICLE PLAX 2D LVIDd:         5.64 cm LVIDs:         4.96 cm LV PW:         1.06 cm         3D Volume EF LV IVS:        0.92 cm         LV 3D EF:    Left LVOT diam:     2.10 cm                      ventricul LV SV:         41                           ar LV SV Index:   30                           ejection LVOT Area:     3.46 cm                     fraction by 3D volume is 33 %.  3D Volume EF: 3D EF:        33 % LV EDV:       161 ml LV ESV:       108 ml LV SV:        53 ml  RIGHT VENTRICLE RV S prime:     9.73 cm/s TAPSE (M-mode): 2.1 cm  LEFT ATRIUM             Index        RIGHT ATRIUM           Index LA diam:        3.10 cm 2.22 cm/m   RA Area:  15.10 cm LA Vol (A2C):   74.1 ml 53.13  ml/m  RA Volume:   37.90 ml  27.17 ml/m LA Vol (A4C):   75.7 ml 54.27 ml/m LA Biplane Vol: 78.9 ml 56.57 ml/m AORTIC VALVE LVOT Vmax:   69.67 cm/s LVOT Vmean:  43.833 cm/s LVOT VTI:    0.119 m  AORTA Ao Root diam: 3.90 cm Ao Asc diam:  3.40 cm  MR Peak grad:   86.9 mmHg    TRICUSPID VALVE MR Mean grad:   56.0 mmHg    TR Peak grad:   42.8 mmHg MR Vmax:        466.00 cm/s  TR Vmax:        327.00 cm/s MR Vmean:       356.0 cm/s MR PISA:        1.57 cm     SHUNTS MR PISA Radius: 0.50 cm      Systemic VTI:  0.12 m Systemic Diam: 2.10 cm  Soyla Merck MD Electronically signed by Soyla Merck MD Signature Date/Time: 07/26/2023/10:35:15 AM    Final    MONITORS  LONG TERM MONITOR (3-14 DAYS) 09/20/2023  Narrative Patch Wear Time:  2 days and 22 hours (2025-03-07T15:29:08-498 to 2025-03-10T15:06:13-0400)  Predominant rhythm:   Sinus 71 to 108 bpm  Average HR 94 bpm 46 episodes of SVT, fastest for 7 beats at 231 bpm, longest for 12.7 seconds at 160 bpm Occasional PACs (2.4% total) Rare PVCs  No diary entries     CARDIAC MRI  MR CARDIAC MORPHOLOGY W WO CONTRAST 09/16/2006  Narrative CARDIAC MRI: Cindy Meza is a 88 year old patient of Dr. Garald and myself. She has a history of PACs and palpitations. She had an echocardiogram suggesting previous inferior wall MI with an EF of 50-55%. This study was done to reassess LV function and rule out scar tissue and coronary disease. Protocol: The patient was scanned on the 1.5 Tesla GE magnet. A dedicated cardiac coil was used. Functional imaging was performed using Fiesta sequences. True short axis imaging from base to apex was done for quantification of ejection fraction. Two, three, and four chamber views were also done to assess for regional wall motion abnormalities. The patient received 0.2 mg/kg of gadolinium. A total of 35 cc of Magnevist was given. After 10 minutes hyperenhancement imaging using inversion recovery  sequence was performed. Findings: The left ventricular cavity size is mildly dilated. There was no significant left ventricular hypertrophy. There was some abnormal septal motion and dysenergy to the LV motion. The quantitative ejection fraction was 50% and there was no thinning of the myocardium and no evidence of a discrete wall motion abnormality. There was mild left atrial enlargement. Right-sided cardiac chambers were normal. There was no ASD and no VSD. The aortic valve appeared trileaflet. There was probable mild mitral insufficiency and mild aortic insufficiency. Hyperenhancement imaging showed no evidence of scar tissue.  Impression 1. Global hypokinesis with septal dysenergy likely related to a form of nonischemic cardiomyopathy or related to the patient's frequent PACS. Gated ejection fraction is 50%. 2. No evidence of hyperenhancement or scar tissue. No evidence of previous myocardial infarction. 3. Probable mild aortic insufficiency. Suggest echo correlation. 4. Mild left atrial enlargement. The patient tolerated the procedure well. To be billed by [facility] as [phone] cardiac MRI function and morphology and gadolinium. Done in conjunction with Dr. Marcey Slain from [facility].  Provider: Tilford Mangle   ______________________________________________________________________________________________       Current Reported  Medications:.    Current Meds  Medication Sig   albuterol  (PROVENTIL  HFA) 108 (90 Base) MCG/ACT inhaler Inhale 1-2 puffs into the lungs every 4 (four) hours as needed for wheezing or shortness of breath.   aspirin  81 MG tablet Take 81 mg by mouth daily.   budesonide -formoterol  (SYMBICORT ) 80-4.5 MCG/ACT inhaler Inhale 2 puffs into the lungs 2 (two) times daily.   calcium  carbonate (TUMS) 500 MG chewable tablet Chew 1 tablet by mouth 3 (three) times daily. (Patient taking differently: Chew 1 tablet by mouth 3 (three) times daily.)    Calcium -Magnesium-Vitamin D  (CALCIUM  500 PO) Take 1 tablet by mouth 2 (two) times daily with a meal. With lunch and supper (Patient taking differently: Take 1 tablet by mouth 2 (two) times daily with a meal. With lunch and supper)   [employer] OMEGA-3 KRILL OIL 500 MG CAPS Take 500 capsules by mouth daily.   Cyanocobalamin (VITAMIN B-12) 1000 MCG SUBL Place 1 tablet (1,000 mcg total) under the tongue in the morning.   estradiol  (ESTRACE ) 0.1 MG/GM vaginal cream Place vaginally.   famotidine  (PEPCID ) 20 MG tablet Take 1 tablet (20 mg total) by mouth daily.   furosemide  (LASIX ) 20 MG tablet Take 1 tablet (20 mg total) by mouth daily as needed for edema (for weight gain 2 to 3 lbs in 24 hrs or 5 lbs in 7 days.).   LORazepam  (ATIVAN ) 0.5 MG tablet Take 0.5-1 tablets (0.25-0.5 mg total) by mouth at bedtime as needed for anxiety (insomnia, overactive bladder).   metoprolol  succinate (TOPROL -XL) 25 MG 24 hr tablet Take 0.5 tablets (12.5 mg total) by mouth at bedtime.   pantoprazole  (PROTONIX ) 20 MG tablet Take 1 tablet (20 mg total) by mouth daily.   polyethylene glycol (MIRALAX  / GLYCOLAX ) 17 g packet Take 17 g by mouth daily.   spironolactone  (ALDACTONE ) 25 MG tablet Take 0.5 tablets (12.5 mg total) by mouth daily. Take with lunch   Vitamin D -Vitamin K (VITAMIN K2-VITAMIN D3 PO) Take 1 capsule by mouth daily.   Wheat Dextrin (BENEFIBER DRINK MIX PO) Take 1 Capful by mouth daily.    Physical Exam:    VS:  BP 110/68   Ht 5' 1 (1.549 m)   Wt 89 lb (40.4 kg)   SpO2 98%   BMI 16.82 kg/m    Wt Readings from Last 3 Encounters:  03/06/24 89 lb (40.4 kg)  02/26/24 83 lb 8 oz (37.9 kg)  02/07/24 84 lb 6.4 oz (38.3 kg)    GEN: Well nourished, well developed in no acute distress NECK: No JVD; No carotid bruits CARDIAC: RRR, 2/6 systolic murmur, no rubs or gallops RESPIRATORY:  Clear to auscultation without rales, wheezing or rhonchi  ABDOMEN: Soft, non-tender, non-distended EXTREMITIES:  No edema; No acute  deformity     Asessement and Plan:.    Chronic systolic HF: Prior echo in 2019 with normal EF.  Echo in 07/2023 indicated EF 25%, LV with GHK, RV mildly reduced, LA severely dilated, RA mildly dilated, small pericardial effusion present.  L/RHC in 07/2023 was consistent with nonischemic cardiomyopathy, LHC showed mild nonobstructive CAD.  GDMT has been limited by orthostatic hypotension. Today she reports that she is doing well, denies any chest pain or shortness of breath, orthopnea or PND.  She notes some mild intermittent bilateral ankle edema that resolves overnight.  She reports that she has been tolerating Toprol  12.5 mg daily and spironolactone  12.5 mg daily overall well.  Reviewed ED precautions.  Continue Lasix , metoprolol  and  spironolactone .  Severe MR: Echo in 07/2023 with severe mitral regurgitation, likely functional in setting of dilated LV.  Patient was seen by Dr. Wendel, not felt to be a candidate for mitral transcatheter edge-to-edge repair.  Reviewed with patient, she notes she understands and is unsure if she would proceed with a invasive procedure at this time in any case.  She denies any significant shortness of breath and feels that she is doing well overall.  Orthostatic hypotension: Patient reports that she has not had any significant dizziness or lightheadedness in recent months, denies any presyncope or syncope.  Blood pressure today 110/68.  She reports that she is tolerating metoprolol  and spironolactone .  Her Florinef  was previously discontinued. Patient intolerant of midodrine , results in increased nausea.  CAD: Noted to have mild nonobstructive CAD on left heart catheterization in 07/2023.  Patient denies chest pain or increased shortness of breath.  Given lack of symptoms patient deferred EKG today. Continue aspirin  81 mg daily.  Reviewed ED precautions.  Hyperlipidemia: Statin therapy deferred given advanced age and mild coronary artery disease.   Disposition: F/u with  Dr. Okey or Eban Weick, NP in 4 months.   Signed, Ethne Jeon D Kaylon Hitz, NP

## 2024-03-06 ENCOUNTER — Encounter: Payer: Self-pay | Admitting: Cardiology

## 2024-03-06 ENCOUNTER — Ambulatory Visit: Attending: Cardiology | Admitting: Cardiology

## 2024-03-06 VITALS — BP 110/68 | Ht 61.0 in | Wt 89.0 lb

## 2024-03-06 DIAGNOSIS — E785 Hyperlipidemia, unspecified: Secondary | ICD-10-CM

## 2024-03-06 DIAGNOSIS — I340 Nonrheumatic mitral (valve) insufficiency: Secondary | ICD-10-CM | POA: Diagnosis not present

## 2024-03-06 DIAGNOSIS — I5022 Chronic systolic (congestive) heart failure: Secondary | ICD-10-CM

## 2024-03-06 DIAGNOSIS — I951 Orthostatic hypotension: Secondary | ICD-10-CM | POA: Diagnosis not present

## 2024-03-06 DIAGNOSIS — I2510 Atherosclerotic heart disease of native coronary artery without angina pectoris: Secondary | ICD-10-CM | POA: Diagnosis not present

## 2024-03-06 NOTE — Patient Instructions (Signed)
 Medication Instructions:  No changes *If you need a refill on your cardiac medications before your next appointment, please call your pharmacy*  Lab Work: No labs  Testing/Procedures: No testing  Follow-Up: At [facility], you and your health needs are our priority.  As part of our continuing mission to provide you with exceptional heart care, our providers are all part of one team.  This team includes your primary Cardiologist (physician) and Advanced Practice Providers or APPs (Physician Assistants and Nurse Practitioners) who all work together to provide you with the care you need, when you need it.  Your next appointment:   4 month(s)  Provider:   Vina Gull, MD

## 2024-03-25 ENCOUNTER — Ambulatory Visit (INDEPENDENT_AMBULATORY_CARE_PROVIDER_SITE_OTHER): Admitting: Internal Medicine

## 2024-03-25 ENCOUNTER — Encounter: Payer: Self-pay | Admitting: Internal Medicine

## 2024-03-25 VITALS — BP 90/56 | HR 56 | Ht 61.0 in | Wt 87.2 lb

## 2024-03-25 DIAGNOSIS — K5900 Constipation, unspecified: Secondary | ICD-10-CM | POA: Diagnosis not present

## 2024-03-25 DIAGNOSIS — R634 Abnormal weight loss: Secondary | ICD-10-CM | POA: Diagnosis not present

## 2024-03-25 DIAGNOSIS — K5909 Other constipation: Secondary | ICD-10-CM

## 2024-03-25 DIAGNOSIS — R1030 Lower abdominal pain, unspecified: Secondary | ICD-10-CM | POA: Diagnosis not present

## 2024-03-25 NOTE — Patient Instructions (Signed)
 Drink 2 Premier Protein Drinks daily as tolerated.   Use may discontinue Pepcid  (famotidine ) daily use and use ONLY as needed.   Follow-Up with Dr. Garald regarding Calcium .   Follow-up as needed.   _______________________________________________________  If your blood pressure at your visit was 140/90 or greater, please contact your primary care physician to follow up on this.  _______________________________________________________  If you are age 86 or older, your body mass index should be between 23-30. Your Body mass index is 16.48 kg/m. If this is out of the aforementioned range listed, please consider follow up with your Primary Care Provider.  If you are age 36 or younger, your body mass index should be between 19-25. Your Body mass index is 16.48 kg/m. If this is out of the aformentioned range listed, please consider follow up with your Primary Care Provider.   ________________________________________________________  The [place] [facility] providers would like to encourage you to use MYCHART to communicate with providers for non-urgent requests or questions.  Due to long hold times on the telephone, sending your provider a message by [facility] may be a faster and more efficient way to get a response.  Please allow 48 business hours for a response.  Please remember that this is for non-urgent requests.  _______________________________________________________  [facility] is using a team-based approach to care.  Your team is made up of your doctor and two to three APPS. Our APPS (Nurse Practitioners and Physician Assistants) work with your physician to ensure care continuity for you. They are fully qualified to address your health concerns and develop a treatment plan. They communicate directly with your gastroenterologist to care for you. Seeing the Advanced Practice Practitioners on your physician's team can help you by facilitating care more promptly, often allowing for  earlier appointments, access to diagnostic testing, procedures, and other specialty referrals.   Thank you for choosing me and [facility].  Dr. Lupita Commander

## 2024-03-25 NOTE — Progress Notes (Signed)
 Cindy Meza 88 y.o. 1936/02/24 [id number]  Assessment & Plan:   Encounter Diagnoses  Name Primary?   Chronic constipation Yes   Loss of weight - improved    Lower abdominal pain - improved    Patient seems to be improved.  I have recommended she try Premier protein to containers daily which should be less sweet than boost or Ensure.  She may use Pepcid  as needed, she can stop that and if nausea returns she can add it back to her regimen.  I have recommended she follow-up with Dr. Garald or Dr. Samra for about calcium  supplementation questions and follow through with her urology evaluation for her urinary difficulties.  She may return here as needed.  I appreciate the opportunity to care for this patient. CC: Cindy Meza, Cindy GAILS, MD   Subjective:   Chief Complaint:  HPI 88 year old woman with a history of chronic constipation and chronic abdominal pain who is here for follow-up.  She saw Cindy Failing, PA-C in July with complaints of lower abdominal pain and nausea.  CT scan of the abdomen and pelvis was ordered and was unrevealing.  She had been losing some weight but she has been eating better and taking Ensure or boost which she thinks are too sweet, but is gaining weight.  She has questions about calcium  supplementation and is it acceptable to take Tums.  Dr. Sheppard Matas had recommended 2 Tums twice a day.  The patient is still on a calcium  pill and wants to know what she should take.  She also has significant nocturia problems and has not [facility] evaluation tomorrow.    Wt Readings from Last 3 Encounters:  03/25/24 87 lb 3.2 oz (39.6 kg)  03/06/24 89 lb (40.4 kg)  02/26/24 83 lb 8 oz (37.9 kg)   CT abd/pelvis w/ contrast 01/29/24 IMPRESSION: 1. A moderate volume stool material is present throughout the colon. Correlate clinically for signs of constipation. 2. No CT evidence of intra-abdominal or intrapelvic mass. 3. Mild atherosclerotic  changes in the abdominal aorta and major branch vessels. Allergies  Allergen Reactions   Fosamax [Alendronate Sodium]     Paresthesias, abd pain   Gabapentin      dizziness   Lidocaine  Swelling    Of face and lips.  Novocaine does same thing.   Midodrine      Nausea    Risedronate Sodium     Knee pain   Shellfish Allergy Hives   Statins     REACTION: pains   Current Meds  Medication Sig   albuterol  (PROVENTIL  HFA) 108 (90 Base) MCG/ACT inhaler Inhale 1-2 puffs into the lungs every 4 (four) hours as needed for wheezing or shortness of breath.   aspirin  81 MG tablet Take 81 mg by mouth daily.   budesonide -formoterol  (SYMBICORT ) 80-4.5 MCG/ACT inhaler Inhale 2 puffs into the lungs 2 (two) times daily.   calcium  carbonate (TUMS) 500 MG chewable tablet Chew 1 tablet by mouth 3 (three) times daily. (Patient taking differently: Chew 1 tablet by mouth 3 (three) times daily.)   Calcium -Magnesium-Vitamin D  (CALCIUM  500 PO) Take 1 tablet by mouth 2 (two) times daily with a meal. With lunch and supper (Patient taking differently: Take 1 tablet by mouth 2 (two) times daily with a meal. With lunch and supper)   [employer] OMEGA-3 KRILL OIL 500 MG CAPS Take 500 capsules by mouth daily.   Cyanocobalamin (VITAMIN B-12) 1000 MCG SUBL Place 1 tablet (1,000 mcg total) under the tongue in the  morning.   estradiol  (ESTRACE ) 0.1 MG/GM vaginal cream Place vaginally.   famotidine  (PEPCID ) 20 MG tablet Take 1 tablet (20 mg total) by mouth daily.   furosemide  (LASIX ) 20 MG tablet Take 1 tablet (20 mg total) by mouth daily as needed for edema (for weight gain 2 to 3 lbs in 24 hrs or 5 lbs in 7 days.).   LORazepam  (ATIVAN ) 0.5 MG tablet Take 0.5-1 tablets (0.25-0.5 mg total) by mouth at bedtime as needed for anxiety (insomnia, overactive bladder).   metoprolol  succinate (TOPROL -XL) 25 MG 24 hr tablet Take 0.5 tablets (12.5 mg total) by mouth at bedtime.   pantoprazole  (PROTONIX ) 20 MG tablet Take 1 tablet (20 mg total)  by mouth daily.   polyethylene glycol (MIRALAX  / GLYCOLAX ) 17 g packet Take 17 g by mouth daily.   spironolactone  (ALDACTONE ) 25 MG tablet Take 0.5 tablets (12.5 mg total) by mouth daily. Take with lunch   Vitamin D -Vitamin K (VITAMIN K2-VITAMIN D3 PO) Take 1 capsule by mouth daily.   Wheat Dextrin (BENEFIBER DRINK MIX PO) Take 1 Capful by mouth daily.   Past Medical History:  Diagnosis Date   Abdominal pain 03/30/2015   Dr Avram     /16, 1/21 ?fosamax related  S/p gyn eval, abd CT (-)  11/16 50% better  Better off Fosamax, off Miralax    12/22 Recurrent abd pain - pt saw Dr Avram; CT w/constipation - using Miralax  and Benefiber - better now     Allergy    Anemia    Asthma    Dr. Darlean   Blood transfusion without reported diagnosis    Breast cyst    Left   Cataract    Dr. Roz   Cerumen impaction 04/06/2016   9/17 L, 10/19 B     Chest pressure 07/22/2012   1/14, 8/17  Recurrent - ?asthma related (11/17)  Advair MDI - use qd, not prn  We added Singulair   Cardiac w/up was (-) - --- Dr Okey     Closed right hip fracture (HCC) 06/12/2016   Dr Dozier  R hip fx - intermedullary nail 06/13/17 s/p   On Prolia , calcium , Vit D        Complication of anesthesia    ' I HAD HALLUCINATIONS WHEN THEY DID MY HIP    GERD (gastroesophageal reflux disease)    Heart murmur    Hemorrhoids    History of colonic polyps Cindy Meza   hx of adenomas (small) 1998   Hot flash, menopausal 02/15/2012   Ongoing   D/c estrogens due to breast abnormalities 2013     Nausea 02/12/2019   8/20 d/c Midodrin, Florinef   Occasional.   Nausea after taking Pantoprazole  - take w/food     OA (osteoarthritis)    Orthostatic hypotension    Osteopenia    Osteoporosis    PAC (premature atrial contraction)    Upper respiratory infection 06/13/2023   Zpac if worse     Vitamin D  deficiency    Past Surgical History:  Procedure Laterality Date   ABDOMINAL HYSTERECTOMY     complete   APPENDECTOMY     BREAST CYST  EXCISION     CATARACT EXTRACTION, BILATERAL     04/23/18 left eye, 04/02/18 right eye   COLONOSCOPY  Multiple   Adenomatous colon polyps   ESOPHAGOGASTRODUODENOSCOPY  2010   INTRAMEDULLARY (IM) NAIL INTERTROCHANTERIC Right 06/13/2016   Procedure: INTRAMEDULLARY (IM) NAIL INTERTROCHANTRIC;  Surgeon: Eva Dozier, MD;  Location: [facility] OR;  Service: Orthopedics;  Laterality: Right;   RIGHT/LEFT HEART CATH AND CORONARY ANGIOGRAPHY N/A 07/31/2023   Procedure: RIGHT/LEFT HEART CATH AND CORONARY ANGIOGRAPHY;  Surgeon: Darron Deatrice LABOR, MD;  Location: [facility] INVASIVE [facility];  Service: Cardiovascular;  Laterality: N/A;   TONSILLECTOMY     Social History   Social History Narrative   She lives alone in a [place] home.  No children.   Retired Child psychotherapist.   Highest level of education:  Masters degree      Right Handed    Lives in one story home / lives alone 2025   family history includes Diabetes (age of onset: 80) in her mother; Hypertension in an other family member.   Review of Systems As per HPI  Objective:   Physical Exam BP (!) 90/56   Pulse (!) 56   Ht 5' 1 (1.549 m)   Wt 87 lb 3.2 oz (39.6 kg)   BMI 16.48 kg/m  Thin elderly woman no acute distress  I spent 17 minutes of time, including in depth chart review, independent review of results as outlined above, communicating results with the patient directly, face-to-face time with the patient, coordinating care, ordering studies and medications as appropriate, and documentation.

## 2024-04-08 ENCOUNTER — Other Ambulatory Visit: Payer: Self-pay

## 2024-04-08 DIAGNOSIS — N1831 Chronic kidney disease, stage 3a: Secondary | ICD-10-CM

## 2024-04-08 DIAGNOSIS — M810 Age-related osteoporosis without current pathological fracture: Secondary | ICD-10-CM

## 2024-04-08 DIAGNOSIS — E43 Unspecified severe protein-calorie malnutrition: Secondary | ICD-10-CM

## 2024-04-08 DIAGNOSIS — E559 Vitamin D deficiency, unspecified: Secondary | ICD-10-CM

## 2024-04-08 NOTE — Patient Outreach (Unsigned)
 Complex Care Management   Visit Note  04/08/2024  Name:  Cindy Meza MRN: [id number] DOB: 1935-11-25  Situation: Referral received for Complex Care Management related to Heart Failure I obtained verbal consent from Patient.  Visit completed with Patient  on the phone  Background:   Past Medical History:  Diagnosis Date   Abdominal pain 03/30/2015   Dr Avram     /16, 1/21 ?fosamax related  S/p gyn eval, abd CT (-)  11/16 50% better  Better off Fosamax, off Miralax    12/22 Recurrent abd pain - pt saw Dr Avram; CT w/constipation - using Miralax  and Benefiber - better now     Allergy    Anemia    Asthma    Dr. Darlean   Blood transfusion without reported diagnosis    Breast cyst    Left   Cataract    Dr. Roz   Cerumen impaction 04/06/2016   9/17 L, 10/19 B     Chest pressure 07/22/2012   1/14, 8/17  Recurrent - ?asthma related (11/17)  Advair MDI - use qd, not prn  We added Singulair   Cardiac w/up was (-) - --- Dr Okey     Closed right hip fracture (HCC) 06/12/2016   Dr Dozier  R hip fx - intermedullary nail 06/13/17 s/p   On Prolia , calcium , Vit D        Complication of anesthesia    ' I HAD HALLUCINATIONS WHEN THEY DID MY HIP    GERD (gastroesophageal reflux disease)    Heart murmur    Hemorrhoids    History of colonic polyps [place]   hx of adenomas (small) 1998   Hot flash, menopausal 02/15/2012   Ongoing   D/c estrogens due to breast abnormalities 2013     Nausea 02/12/2019   8/20 d/c Midodrin, Florinef   Occasional.   Nausea after taking Pantoprazole  - take w/food     OA (osteoarthritis)    Orthostatic hypotension    Osteopenia    Osteoporosis    PAC (premature atrial contraction)    Upper respiratory infection 06/13/2023   Zpac if worse     Vitamin D  deficiency     Assessment: Patient request no additional follow up at this time. Goals met-case closed. Patient to contact RNCM and/or primary care if care management needs in the future.  Patient  Reported Symptoms: Cognitive Cognitive Status: No symptoms reported      Neurological Neurological Review of Symptoms: No symptoms reported    HEENT HEENT Symptoms Reported: No symptoms reported      Cardiovascular Cardiovascular Symptoms Reported: No symptoms reported    Respiratory Respiratory Symptoms Reported: No symptoms reported    Endocrine Endocrine Symptoms Reported: No symptoms reported    Gastrointestinal Gastrointestinal Symptoms Reported: Nausea Additional Gastrointestinal Details: patient reports nausea on occasion. Follow up GI appointment completed on 03/25/24-instructions per GI to use Pepcid  as needed: she can stop that and if nausea returns she can add it back to her regimen . Patient continues to drink nutritional supplement and drinks a can throughout the day. Patient eating better. She continues to monitor weight daily. Patient weight increase noted 01/01/24 weight 80.5lbs and weight today 85.5 lbs. Gastrointestinal Management Strategies: Medication therapy, Diet modification    Genitourinary Genitourinary Symptoms Reported: Frequency Other Genitourinary Symptoms: reports visit with [facility] and reports will follow up in a year. doing kegal follow up    Integumentary Integumentary Symptoms Reported: No symptoms reported    Musculoskeletal Musculoskelatal  Symptoms Reviewed: No symptoms reported        Psychosocial Psychosocial Symptoms Reported: Anxiety - if selected complete GAD Additional Psychological Details: patient reports sometimes in keeping up with things she may have some anxiety. she declines to complete GAD. RNCM reinforced LCSW available if she needs to discuss anxiety or if the anxiety becomes an issue or concern for her. Patient voiced understanding.          04/08/2024    PHQ2-9 Depression Screening   Little interest or pleasure in doing things    Feeling down, depressed, or hopeless    PHQ-2 - Total Score    Trouble  falling or staying asleep, or sleeping too much    Feeling tired or having little energy    Poor appetite or overeating     Feeling bad about yourself - or that you are a failure or have let yourself or your family down    Trouble concentrating on things, such as reading the newspaper or watching television    Moving or speaking so slowly that other people could have noticed.  Or the opposite - being so fidgety or restless that you have been moving around a lot more than usual    Thoughts that you would be better off dead, or hurting yourself in some way    PHQ2-9 Total Score    If you checked off any problems, how difficult have these problems made it for you to do your work, take care of things at home, or get along with other people    Depression Interventions/Treatment      There were no vitals filed for this visit.  Medications Reviewed Today     Reviewed by Eulanda Dorion M, RN (Registered Nurse) on 04/08/24 at 1248  Med List Status: <None>   Medication Order Taking? Sig Documenting Provider Last Dose Status Informant  albuterol  (PROVENTIL  HFA) 108 (90 Base) MCG/ACT inhaler [id number] Yes Inhale 1-2 puffs into the lungs every 4 (four) hours as needed for wheezing or shortness of breath. [name], Aleksei V, MD  Active Self, Pharmacy Records  aspirin  81 MG tablet [id number] Yes Take 81 mg by mouth daily. [provider]  Active Self, Pharmacy Records  budesonide -formoterol  (SYMBICORT ) 80-4.5 MCG/ACT inhaler [id number] Yes Inhale 2 puffs into the lungs 2 (two) times daily. [name], Aleksei V, MD  Active Self, Pharmacy Records  calcium  carbonate (TUMS) 500 MG chewable tablet [id number] Yes Chew 1 tablet by mouth 3 (three) times daily. [provider]  Active Self  Calcium -Magnesium-Vitamin D  (CALCIUM  500 PO) [id number] Yes Take 1 tablet by mouth 2 (two) times daily with a meal. With lunch and supper   Active Self, Pharmacy Records  [employer]S OMEGA-3 KRILL OIL 500 MG CAPS [id number]  Yes Take 500 capsules by mouth daily. [provider]  Active Self, Pharmacy Records  Cyanocobalamin (VITAMIN B-12) 1000 MCG SUBL [id number] Yes Place 1 tablet (1,000 mcg total) under the tongue in the morning. Cara [name], NP  Active Self, Pharmacy Records  estradiol  (ESTRACE ) 0.1 MG/GM vaginal cream [id number] Yes Place vaginally. [provider]  Active            Med Note [name], [name]   Wed Dec 04, 2023 11:38 AM) Reports uses twice a week as needed.  famotidine  (PEPCID ) 20 MG tablet [id number] Yes Take 1 tablet (20 mg total) by mouth daily as needed for heartburn or indigestion. [name], MD  Active   furosemide  (LASIX ) 20 MG tablet  [id number] Yes Take 1 tablet (20 mg total) by mouth daily as needed for edema (for weight gain 2 to 3 lbs in 24 hrs or 5 lbs in 7 days.). Arrien, Elidia Sieving, MD  Active   LORazepam  (ATIVAN ) 0.5 MG tablet [id number] Yes Take 0.5-1 tablets (0.25-0.5 mg total) by mouth at bedtime as needed for anxiety (insomnia, overactive bladder). [name], Aleksei V, MD  Active Self, Pharmacy Records           Med Note [place], Davidson D   Tue Jul 30, 2023  1:55 PM) unknown  metoprolol  succinate (TOPROL -XL) 25 MG 24 hr tablet [id number] Yes Take 0.5 tablets (12.5 mg total) by mouth at bedtime. [name], Aleksei V, MD  Active   pantoprazole  (PROTONIX ) 20 MG tablet [id number] Yes Take 1 tablet (20 mg total) by mouth daily. [name], Aleksei V, MD  Active   polyethylene glycol (MIRALAX  / GLYCOLAX ) 17 g packet [id number] Yes Take 17 g by mouth daily. [provider]  Active Self, Pharmacy Records  spironolactone  (ALDACTONE ) 25 MG tablet [id number] Yes Take 0.5 tablets (12.5 mg total) by mouth daily. Take with lunch [name], Aleksei V, MD  Active   Vitamin D -Vitamin K (VITAMIN K2-VITAMIN D3 PO) [id number] Yes Take 1 capsule by mouth daily. [provider]  Active Self  Wheat Dextrin (BENEFIBER DRINK MIX PO) [id number]  Take 1  Capful by mouth daily.  Patient not taking: Reported on 04/08/2024   [provider]  Active Self, Pharmacy Records          Recommendation:   Patient to continue to follow up with providers as scheduled  Follow Up Plan:   Patient has met all care management goals. Care Management case will be closed. Patient has been provided contact information should new needs arise.   Heddy Shutter, RN, MSN, BSN, CCM [place]  [facility], Population Health Case Manager Phone: [phone]

## 2024-04-09 NOTE — Patient Instructions (Signed)
 Visit Information  Thank you for taking time to visit with me today. Please don't hesitate to contact me if I can be of assistance to you.   I have placed a referral to the clinical pharmacist per your request to discuss and review your medications with you.  Patient has met all care management goals. Care Management case will be closed. Patient has been provided contact information should new needs arise.    Please call the Suicide and Crisis Lifeline: 988 call the [place]  National Suicide Prevention Lifeline: 262-218-6696 or TTY: 213-762-8260 TTY 2342540277) to talk to a trained counselor if you are experiencing a Mental Health or Behavioral Health Crisis or need someone to talk to.  Heddy Shutter, RN, MSN, BSN, CCM [place]  [facility], Population Health Case Manager Phone: [phone]

## 2024-04-10 ENCOUNTER — Telehealth: Payer: Self-pay | Admitting: *Deleted

## 2024-04-10 NOTE — Progress Notes (Signed)
 Care Guide Pharmacy Note  04/10/2024 Name: Cindy Meza MRN: [id number] DOB: [birth year]  Referred By: Garald [name], MD Reason for referral: Call Attempt #1 and Complex Care Management (Outreach to schedule referral with pharmacist )   Cindy Meza is a 88 y.o. year old female who is a primary care patient of [name], [name], MD.  Cindy Meza was referred to the pharmacist for assistance related to: CKD Stage 3  An unsuccessful telephone outreach was attempted today to contact the patient who was referred to the pharmacy team for assistance with medication management. Additional attempts will be made to contact the patient.  Cindy Meza, CMA [place]  [facility], [facility] Guide Direct Dial: [phone]  Fax: [phone] Website: [email or web address]

## 2024-04-13 NOTE — Progress Notes (Unsigned)
 Care Guide Pharmacy Note  04/13/2024 Name: Cindy Meza MRN: [id number] DOB: 01/15/1936  Referred By: Garald [name], MD Reason for referral: Call Attempt #1 and Complex Care Management (Outreach to schedule referral with pharmacist )   Cindy Meza is a 88 y.o. year old female who is a primary care patient of [name], [name], MD.  Cindy Meza was referred to the pharmacist for assistance related to: CKD Stage 3  A second unsuccessful telephone outreach was attempted today to contact the patient who was referred to the pharmacy team for assistance with medication management. Additional attempts will be made to contact the patient.  Thedford Franks, CMA [place]  [facility], [facility] Guide Direct Dial: [phone]  Fax: [phone] Website: [email or web address]

## 2024-04-14 NOTE — Progress Notes (Signed)
 Care Guide Pharmacy Note  04/14/2024 Name: Cindy Meza MRN: [id number] DOB: 01-13-1936  Referred By: Garald Cindy GAILS, Cindy Meza Reason for referral: Call Attempt #1 and Complex Care Management (Outreach to schedule referral with pharmacist )   Cindy Meza is a 88 y.o. year old female who is a primary care patient of Cindy Meza, Cindy GAILS, Cindy Meza.  Cindy Meza was referred to the pharmacist for assistance related to: CKD Stage 3  Successful contact was made with the patient to discuss pharmacy services including being ready for the pharmacist to call at least 5 minutes before the scheduled appointment time and to have medication bottles and any blood pressure readings ready for review. The patient agreed to meet with the pharmacist via telephone visit on 04/23/2024  Thedford Franks, CMA [place]  [facility], [facility] Guide Direct Dial: [phone]  Fax: [phone] Website: [email or web address]

## 2024-04-23 ENCOUNTER — Other Ambulatory Visit: Admitting: Pharmacist

## 2024-04-23 DIAGNOSIS — K219 Gastro-esophageal reflux disease without esophagitis: Secondary | ICD-10-CM

## 2024-04-23 DIAGNOSIS — M810 Age-related osteoporosis without current pathological fracture: Secondary | ICD-10-CM

## 2024-04-23 NOTE — Progress Notes (Cosign Needed Addendum)
 04/23/2024 Name: Cindy Meza MRN: [id number] DOB: [birth year]  Chief Complaint  Patient presents with   Medication Management    Cindy Meza is a 88 y.o. year old female who presented for a telephone visit.   They were referred to the pharmacist by the [facility] Case Management program for assistance in managing medication review.   Subjective:  Care Team: Primary Care Provider: Garald [name], Meza ; Next Scheduled Visit: 05/13/24   Medication Access/Adherence  Current Pharmacy:  [id number] [place], [place] - [address] AT [facility] [address] [place] [address] Phone: [phone] Fax: [phone]  [facility] [address] [place] [address] Phone: [phone] Fax: [phone]   Patient reports affordability concerns with their medications: No  Patient reports access/transportation concerns to their pharmacy: No  Patient reports adherence concerns with their medications:  No     Reviewed medication schedule: AM: Aspirin , B12, pantoprazole , K2+D3  Lunch: Tums, calcium  (1 tablet), spironolactone  (1/2 tablet)  Evening: Tums, calcium  (1 tablet), krill oil, metoprolol  (1/2 tablet)  Pt has questions regarding if she needs all of the medications she is currently taking and how she should be taking her medications for acid reflux  She denies s/sx hypotension such as dizziness or lightheadedness. She notes her thighs will feel weak sometimes when she stands up. She denies edema or SOB.   Objective:  Lab Results  Component Value Date   HGBA1C 5.0 12/27/2021    Lab Results  Component Value Date   CREATININE 1.19 01/22/2024   BUN 27 (H) 01/22/2024   NA 138 01/22/2024   K 4.1 01/22/2024   CL 102 01/22/2024   CO2 30 01/22/2024    Lab Results  Component Value Date   CHOL 227 (H) 07/12/2021   HDL 107 07/12/2021   LDLCALC 112 (H) 07/12/2021   LDLDIRECT 104.0 03/17/2013    TRIG 45 07/12/2021   CHOLHDL 2.1 07/12/2021    Medications Reviewed Today     Reviewed by Cindy Meza, RPH (Pharmacist) on 04/23/24 at 1628  Med List Status: <None>   Medication Order Taking? Sig Documenting Provider Last Dose Status Informant  albuterol  (PROVENTIL  HFA) 108 (90 Base) MCG/ACT inhaler [id number]  Inhale 1-2 puffs into the lungs every 4 (four) hours as needed for wheezing or shortness of breath. [name], Cindy Meza  Active Self, Pharmacy Records  aspirin  81 MG tablet [id number] Yes Take 81 mg by mouth daily. Provider, Historical, Meza  Active Self, Pharmacy Records  budesonide -formoterol  (SYMBICORT ) 80-4.5 MCG/ACT inhaler [id number]  Inhale 2 puffs into the lungs 2 (two) times daily. [name], Cindy Meza  Active Self, Pharmacy Records  calcium  carbonate (TUMS) 500 MG chewable tablet [id number] Yes Chew 1 tablet by mouth 3 (three) times daily. Provider, Historical, Meza  Active Self  Calcium -Magnesium-Vitamin Meza  (CALCIUM  500 PO) [id number] Yes Take 1 tablet by mouth 2 (two) times daily with a meal. With lunch and supper   Active Self, Pharmacy Records  [employer] OMEGA-3 KRILL OIL 500 MG CAPS [id number] Yes Take 500 capsules by mouth daily. Provider, Historical, Meza  Active Self, Pharmacy Records  Cyanocobalamin (VITAMIN B-12) 1000 MCG SUBL [id number] Yes Place 1 tablet (1,000 mcg total) under the tongue in the morning. Cindy [name], NP  Active Self, Pharmacy Records  estradiol  (ESTRACE ) 0.1 MG/GM vaginal cream [id number]  Place vaginally. Provider, Historical, Meza  Active  Med Note Cindy Meza   Wed Dec 04, 2023 11:38 AM) Reports uses twice a week as needed.  famotidine  (PEPCID ) 20 MG tablet [id number]  Take 1 tablet (20 mg total) by mouth daily as needed for heartburn or indigestion. Cindy [name], Meza  Active   furosemide  (LASIX ) 20 MG tablet [id number]  Take 1 tablet (20 mg total) by mouth daily as needed for edema (for weight gain 2 to 3 lbs in 24 hrs or 5  lbs in 7 days.). Arrien, Cindy Meza  Active   LORazepam  (ATIVAN ) 0.5 MG tablet [id number]  Take 0.5-1 tablets (0.25-0.5 mg total) by mouth at bedtime as needed for anxiety (insomnia, overactive bladder). [name], Cindy Meza  Active Self, Pharmacy Records           Med Note [place], Cindy Meza   Tue Jul 30, 2023  1:55 PM) unknown  metoprolol  succinate (TOPROL -XL) 25 MG 24 hr tablet [id number] Yes Take 0.5 tablets (12.5 mg total) by mouth at bedtime. [name], Cindy Meza  Active   pantoprazole  (PROTONIX ) 20 MG tablet [id number] Yes Take 1 tablet (20 mg total) by mouth daily. [name], Cindy Meza  Active   polyethylene glycol (MIRALAX  / GLYCOLAX ) 17 g packet [id number]  Take 17 g by mouth daily. Provider, Historical, Meza  Active Self, Pharmacy Records  spironolactone  (ALDACTONE ) 25 MG tablet [id number] Yes Take 0.5 tablets (12.5 mg total) by mouth daily. Take with lunch [name], Cindy Meza  Active   Vitamin Meza -Vitamin K (VITAMIN K2-VITAMIN D3 PO) [id number] Yes Take 1 capsule by mouth daily. Provider, Historical, Meza  Active Self  Wheat Dextrin (BENEFIBER DRINK MIX PO) [id number]  Take 1 Capful by mouth daily.  Patient not taking: Reported on 04/08/2024   Provider, Historical, Meza  Active Self, Pharmacy Records              Assessment/Plan:   Medication review:  - Pt should be taking calcium  citrate 2 tablets twice daily for recommended 800 mg per day calcium  - Recommended to discontinue Tums due to calcium  content with calcium  supplement - Start Pepcid  20 mg daily instead of PRN - Can discontinue krill oil and Vitamin B12 - Monitor for hypotension symptoms - Sent written medication chart to patient address on file  Follow Up Plan: PRN  Cindy Meza, PharmD, BCPS, CPP Clinical Pharmacist Practitioner [facility] [phone]  Medical screening examination/treatment/procedure(s) were performed by non-physician  practitioner and as supervising physician I was immediately available for consultation/collaboration.  I agree with above. [name] Noel, Meza

## 2024-05-13 ENCOUNTER — Encounter: Payer: Self-pay | Admitting: Internal Medicine

## 2024-05-13 ENCOUNTER — Ambulatory Visit (INDEPENDENT_AMBULATORY_CARE_PROVIDER_SITE_OTHER): Admitting: Internal Medicine

## 2024-05-13 VITALS — BP 118/72 | HR 72 | Temp 97.6°F | Ht 61.0 in | Wt 84.0 lb

## 2024-05-13 DIAGNOSIS — R5383 Other fatigue: Secondary | ICD-10-CM

## 2024-05-13 DIAGNOSIS — R0609 Other forms of dyspnea: Secondary | ICD-10-CM

## 2024-05-13 DIAGNOSIS — Z681 Body mass index (BMI) 19 or less, adult: Secondary | ICD-10-CM

## 2024-05-13 DIAGNOSIS — K219 Gastro-esophageal reflux disease without esophagitis: Secondary | ICD-10-CM | POA: Diagnosis not present

## 2024-05-13 LAB — COMPREHENSIVE METABOLIC PANEL WITH GFR
ALT: 21 U/L (ref 0–35)
AST: 27 U/L (ref 0–37)
Albumin: 4.2 g/dL (ref 3.5–5.2)
Alkaline Phosphatase: 39 U/L (ref 39–117)
BUN: 34 mg/dL — ABNORMAL HIGH (ref 6–23)
CO2: 30 meq/L (ref 19–32)
Calcium: 10 mg/dL (ref 8.4–10.5)
Chloride: 101 meq/L (ref 96–112)
Creatinine, Ser: 1.22 mg/dL — ABNORMAL HIGH (ref 0.40–1.20)
GFR: 40.24 mL/min — ABNORMAL LOW (ref >60.00)
Glucose, Bld: 104 mg/dL — ABNORMAL HIGH (ref 70–99)
Potassium: 4.3 meq/L (ref 3.5–5.1)
Sodium: 137 meq/L (ref 135–145)
Total Bilirubin: 0.5 mg/dL (ref 0.2–1.2)
Total Protein: 7.5 g/dL (ref 6.0–8.3)

## 2024-05-13 LAB — CBC WITH DIFFERENTIAL/PLATELET
Basophils Absolute: 0 K/uL (ref 0.0–0.1)
Basophils Relative: 0.6 % (ref 0.0–3.0)
Eosinophils Absolute: 0.1 K/uL (ref 0.0–0.7)
Eosinophils Relative: 0.8 % (ref 0.0–5.0)
HCT: 36.4 % (ref 36.0–46.0)
Hemoglobin: 12.1 g/dL (ref 12.0–15.0)
Lymphocytes Relative: 31.8 % (ref 12.0–46.0)
Lymphs Abs: 2.1 K/uL (ref 0.7–4.0)
MCHC: 33.3 g/dL (ref 30.0–36.0)
MCV: 96.4 fl (ref 78.0–100.0)
Monocytes Absolute: 0.4 K/uL (ref 0.1–1.0)
Monocytes Relative: 6.7 % (ref 3.0–12.0)
Neutro Abs: 3.9 K/uL (ref 1.4–7.7)
Neutrophils Relative %: 60.1 % (ref 43.0–77.0)
Platelets: 231 K/uL (ref 150.0–400.0)
RBC: 3.78 Mil/uL — ABNORMAL LOW (ref 3.87–5.11)
RDW: 12.2 % (ref 11.5–15.5)
WBC: 6.5 K/uL (ref 4.0–10.5)

## 2024-05-13 MED ORDER — SPIRONOLACTONE 25 MG PO TABS
12.5000 mg | ORAL_TABLET | Freq: Every day | ORAL | 3 refills | Status: AC
Start: 2024-05-13 — End: ?

## 2024-05-13 NOTE — Progress Notes (Signed)
 Subjective:  Patient ID: Cindy Meza, female    DOB: 02/09/1936  Age: 88 y.o. MRN: [id number]  CC: Medical Management of Chronic Issues (3 Month follow up)   HPI Cindy Meza presents for CFS, orthostatic sx's, asthma, GERD  Outpatient Medications Prior to Visit  Medication Sig Dispense Refill   albuterol  (PROVENTIL  HFA) 108 (90 Base) MCG/ACT inhaler Inhale 1-2 puffs into the lungs every 4 (four) hours as needed for wheezing or shortness of breath. 8.5 g 5   aspirin  81 MG tablet Take 81 mg by mouth daily.     budesonide -formoterol  (SYMBICORT ) 80-4.5 MCG/ACT inhaler Inhale 2 puffs into the lungs 2 (two) times daily. 1 each 11   Calcium -Magnesium-Vitamin D  (CALCIUM  500 PO) Take 2 tablets by mouth 2 (two) times daily with a meal. With lunch and supper     estradiol  (ESTRACE ) 0.1 MG/GM vaginal cream Place vaginally.     famotidine  (PEPCID ) 20 MG tablet Take 20 mg by mouth daily.     furosemide  (LASIX ) 20 MG tablet Take 1 tablet (20 mg total) by mouth daily as needed for edema (for weight gain 2 to 3 lbs in 24 hrs or 5 lbs in 7 days.). 30 tablet 0   LORazepam  (ATIVAN ) 0.5 MG tablet Take 0.5-1 tablets (0.25-0.5 mg total) by mouth at bedtime as needed for anxiety (insomnia, overactive bladder). 30 tablet 2   metoprolol  succinate (TOPROL -XL) 25 MG 24 hr tablet Take 0.5 tablets (12.5 mg total) by mouth at bedtime. 45 tablet 3   pantoprazole  (PROTONIX ) 20 MG tablet Take 1 tablet (20 mg total) by mouth daily. 90 tablet 3   polyethylene glycol (MIRALAX  / GLYCOLAX ) 17 g packet Take 17 g by mouth daily.     Vitamin D -Vitamin K (VITAMIN K2-VITAMIN D3 PO) Take 1 capsule by mouth daily.     spironolactone  (ALDACTONE ) 25 MG tablet Take 0.5 tablets (12.5 mg total) by mouth daily. Take with lunch 45 tablet 3   Wheat Dextrin (BENEFIBER DRINK MIX PO) Take 1 Capful by mouth daily. (Patient not taking: Reported on 05/13/2024)     No facility-administered medications prior to visit.     ROS: Review of Systems  Constitutional:  Positive for fatigue and unexpected weight change. Negative for activity change, appetite change and chills.  HENT:  Negative for congestion, mouth sores and sinus pressure.   Eyes:  Negative for visual disturbance.  Respiratory:  Negative for cough and chest tightness.   Gastrointestinal:  Negative for abdominal pain, constipation and nausea.  Genitourinary:  Negative for difficulty urinating, frequency and vaginal pain.  Musculoskeletal:  Positive for arthralgias. Negative for back pain and gait problem.  Skin:  Negative for pallor and rash.  Neurological:  Negative for dizziness, tremors, weakness, numbness and headaches.  Hematological:  Does not bruise/bleed easily.  Psychiatric/Behavioral:  Negative for confusion, sleep disturbance and suicidal ideas.     Objective:  BP 118/72   Pulse 72   Temp 97.6 F (36.4 C)   Ht 5' 1 (1.549 m)   Wt 84 lb (38.1 kg)   SpO2 99%   BMI 15.87 kg/m   BP Readings from Last 3 Encounters:  05/13/24 118/72  03/25/24 (!) 90/56  03/06/24 110/68    Wt Readings from Last 3 Encounters:  05/13/24 84 lb (38.1 kg)  03/25/24 87 lb 3.2 oz (39.6 kg)  03/06/24 89 lb (40.4 kg)    Physical Exam Constitutional:      General: She is not in acute distress.  Appearance: She is well-developed. She is not ill-appearing, toxic-appearing or diaphoretic.  HENT:     Head: Normocephalic.     Right Ear: External ear normal.     Left Ear: External ear normal.     Nose: Nose normal.  Eyes:     General:        Right eye: No discharge.        Left eye: No discharge.     Conjunctiva/sclera: Conjunctivae normal.     Pupils: Pupils are equal, round, and reactive to light.  Neck:     Thyroid : No thyromegaly.     Vascular: No JVD.     Trachea: No tracheal deviation.  Cardiovascular:     Rate and Rhythm: Normal rate and regular rhythm.     Heart sounds: Normal heart sounds.  Pulmonary:     Effort: No  respiratory distress.     Breath sounds: No stridor. No wheezing.  Abdominal:     General: Bowel sounds are normal. There is no distension.     Palpations: Abdomen is soft. There is no mass.     Tenderness: There is no abdominal tenderness. There is no guarding or rebound.  Musculoskeletal:        General: No tenderness.     Cervical back: Normal range of motion and neck supple. No rigidity.     Right lower leg: No edema.     Left lower leg: No edema.  Lymphadenopathy:     Cervical: No cervical adenopathy.  Skin:    Findings: No erythema or rash.  Neurological:     Mental Status: She is oriented to person, place, and time.     Cranial Nerves: No cranial nerve deficit.     Motor: No abnormal muscle tone.     Coordination: Coordination normal.     Deep Tendon Reflexes: Reflexes normal.  Psychiatric:        Behavior: Behavior normal.        Thought Content: Thought content normal.        Judgment: Judgment normal.     Lab Results  Component Value Date   WBC 6.1 11/27/2023   HGB 11.7 (L) 11/27/2023   HCT 35.0 (L) 11/27/2023   PLT 193.0 11/27/2023   GLUCOSE 98 01/22/2024   CHOL 227 (H) 07/12/2021   TRIG 45 07/12/2021   HDL 107 07/12/2021   LDLDIRECT 104.0 03/17/2013   LDLCALC 112 (H) 07/12/2021   ALT 18 11/27/2023   AST 25 11/27/2023   NA 138 01/22/2024   K 4.1 01/22/2024   CL 102 01/22/2024   CREATININE 1.19 01/22/2024   BUN 27 (H) 01/22/2024   CO2 30 01/22/2024   TSH 1.580 09/18/2023   INR 1.04 06/12/2016   HGBA1C 5.0 12/27/2021    CT ABDOMEN PELVIS W CONTRAST Result Date: 01/29/2024 CLINICAL DATA:  Weight loss, unintended EXAM: CT ABDOMEN AND PELVIS WITH CONTRAST TECHNIQUE: Multidetector CT imaging of the abdomen and pelvis was performed using the standard protocol following bolus administration of intravenous contrast. RADIATION DOSE REDUCTION: This exam was performed according to the departmental dose-optimization program which includes automated exposure  control, adjustment of the mA and/or kV according to patient size and/or use of iterative reconstruction technique. CONTRAST:  80mL OMNIPAQUE  IOHEXOL  300 MG/ML  SOLN COMPARISON:  CT of the abdomen pelvis performed May 23, 2021 FINDINGS: Lower chest: Enlarged heart. Hepatobiliary: No focal liver abnormality is seen. No gallstones, gallbladder wall thickening, or biliary dilatation. Pancreas: Unremarkable. No pancreatic ductal dilatation  or surrounding inflammatory changes. Spleen: Normal in size without focal abnormality. Adrenals/Urinary Tract: Adrenal glands are within normal limits. No hydronephrosis or significant nephrolithiasis. Urinary bladder is grossly unremarkable. Stomach/Bowel: No dilated loops of bowel are seen. A moderate to large volume of stool material is present in the colon and rectum. Vascular/Lymphatic: No evidence of significant lymphadenopathy. Mild diffuse atherosclerotic changes in the abdominal aorta and major branch vessels. Reproductive: Status post hysterectomy. No adnexal masses. Other: Nothing significant. Musculoskeletal: Degenerative changes in the lumbar spine. Postsurgical changes from right hip internal fixation. IMPRESSION: 1. A moderate volume stool material is present throughout the colon. Correlate clinically for signs of constipation. 2. No CT evidence of intra-abdominal or intrapelvic mass. 3. Mild atherosclerotic changes in the abdominal aorta and major branch vessels. Electronically Signed   By: Maude Naegeli M.D.   On: 01/29/2024 15:24    Assessment & Plan:   Problem List Items Addressed This Visit     BMI less than 19,adult   BMI 15 2025      DOE (dyspnea on exertion)   Doing well F/u w/Cardiology  Lasix /Kdur prn       Relevant Orders   CBC with Differential/Platelet   Comprehensive metabolic panel with GFR   TSH   Fatigue - Primary   Multifactorial- better Treat constipation Monitor labs      Relevant Orders   CBC with  Differential/Platelet   Comprehensive metabolic panel with GFR   TSH   GERD (gastroesophageal reflux disease)   Cont w/Protonix  and Pepcid       Relevant Orders   CBC with Differential/Platelet   Comprehensive metabolic panel with GFR   TSH      Meds ordered this encounter  Medications   spironolactone  (ALDACTONE ) 25 MG tablet    Sig: Take 0.5 tablets (12.5 mg total) by mouth daily. Take with lunch    Dispense:  45 tablet    Refill:  3      Follow-up: Return in about 3 months (around 08/13/2024) for a follow-up visit.  Marolyn Noel, MD

## 2024-05-13 NOTE — Assessment & Plan Note (Signed)
 BMI 15 2025

## 2024-05-13 NOTE — Assessment & Plan Note (Signed)
 Cont w/Protonix  and Pepcid 

## 2024-05-13 NOTE — Assessment & Plan Note (Signed)
 Multifactorial- better Treat constipation Monitor labs

## 2024-05-13 NOTE — Assessment & Plan Note (Addendum)
 Doing well F/u w/Cardiology  Lasix /Kdur prn

## 2024-05-14 LAB — TSH: TSH: 1.18 u[IU]/mL (ref 0.35–5.50)

## 2024-05-17 ENCOUNTER — Ambulatory Visit: Payer: Self-pay | Admitting: Internal Medicine

## 2024-05-27 LAB — HM MAMMOGRAPHY

## 2024-06-21 NOTE — Progress Notes (Unsigned)
 Cardiology Office Note   Date:  06/22/2024   ID:  Cindy Meza, DOB 20-Sep-1935, MRN [id number]  PCP:  Garald [name], MD  Cardiologist:   Vina Gull, MD   Pt presents for follow up of HFrEF      History of Present Illness: Cindy Meza is a 88 y.o. female with a history  fatigue, orthostatic intolerance (maintained of florinef ), mitral regurgitation, asthma, GERD, aortic atherosclerosis   2019  Myoview  showed no ischemia EF down but previous OK      2019 Echo in 2019 LVEF 50 to 55% with mild diastolic dysfunction and mild MR   She did not tolerate rosuvastatin  (constipation)    Sensitive to several meds   Dec 2024  Complained of SOB   Rx with [facility]   Went to ER  CXR with bilateral effusions, BNP 3167  Rx prn lasix   Jan 2025  Echo showed severe LV dysfunction with severe MR    Pt admitted to [facility]  Placed on IV lasix .   R/L heart cath showed mild CAD  Severely elevated filling pressures.   PCWP 32 Pt diuresed 2.4 L   Sent home on  spironolactone  and SGLT 2 inhibitor   Lasix  as  needed for wt gain, edema  She was seen  by A [name] on 08/14/23  Continue spiro and SGLT2i  (dose cut in 1/2)   I saw the pt in March 2025   She was then seen by A Thukkani in Aug 2025  Felt not to be a candidate for edge to edge repair   Since AUg the pt denies CP   SHe says her breathing is OK  No dizziness  No palpitations  Chronic 1 to 2 pillow orthopnea  No LE edema   COmplains of L arm numbness on waking   Outpatient Medications Prior to Visit  Medication Sig Dispense Refill   albuterol  (PROVENTIL  HFA) 108 (90 Base) MCG/ACT inhaler Inhale 1-2 puffs into the lungs every 4 (four) hours as needed for wheezing or shortness of breath. 8.5 g 5   aspirin  81 MG tablet Take 81 mg by mouth daily.     budesonide -formoterol  (SYMBICORT ) 80-4.5 MCG/ACT inhaler Inhale 2 puffs into the lungs 2 (two) times daily. 1 each 11   Calcium -Magnesium-Vitamin D  (CALCIUM  500 PO) Take 2  tablets by mouth 2 (two) times daily with a meal. With lunch and supper     estradiol  (ESTRACE ) 0.1 MG/GM vaginal cream Place vaginally.     famotidine  (PEPCID ) 20 MG tablet Take 20 mg by mouth daily.     furosemide  (LASIX ) 20 MG tablet Take 1 tablet (20 mg total) by mouth daily as needed for edema (for weight gain 2 to 3 lbs in 24 hrs or 5 lbs in 7 days.). 30 tablet 0   LORazepam  (ATIVAN ) 0.5 MG tablet Take 0.5-1 tablets (0.25-0.5 mg total) by mouth at bedtime as needed for anxiety (insomnia, overactive bladder). 30 tablet 2   metoprolol  succinate (TOPROL -XL) 25 MG 24 hr tablet Take 0.5 tablets (12.5 mg total) by mouth at bedtime. 45 tablet 3   pantoprazole  (PROTONIX ) 20 MG tablet Take 1 tablet (20 mg total) by mouth daily. 90 tablet 3   polyethylene glycol (MIRALAX  / GLYCOLAX ) 17 g packet Take 17 g by mouth daily.     spironolactone  (ALDACTONE ) 25 MG tablet Take 0.5 tablets (12.5 mg total) by mouth daily. Take with lunch 45 tablet 3   Vitamin D -Vitamin K (VITAMIN  K2-VITAMIN D3 PO) Take 1 capsule by mouth daily.     Wheat Dextrin (BENEFIBER DRINK MIX PO) Take 1 Capful by mouth daily.     No facility-administered medications prior to visit.     Allergies:   Fosamax [alendronate sodium], Gabapentin , Lidocaine , Midodrine , Risedronate sodium, Shellfish allergy, and Statins   Past Medical History:  Diagnosis Date   Abdominal pain 03/30/2015   Dr Avram     /16, 1/21 ?fosamax related  S/p gyn eval, abd CT (-)  11/16 50% better  Better off Fosamax, off Miralax    12/22 Recurrent abd pain - pt saw Dr Avram; CT w/constipation - using Miralax  and Benefiber - better now     Allergy    Anemia    Asthma    Dr. Darlean   Blood transfusion without reported diagnosis    Breast cyst    Left   Cataract    Dr. Roz   Cerumen impaction 04/06/2016   9/17 L, 10/19 B     Chest pressure 07/22/2012   1/14, 8/17  Recurrent - ?asthma related (11/17)  Advair MDI - use qd, not prn  We added Singulair    Cardiac w/up was (-) - --- Dr Okey     Closed right hip fracture (HCC) 06/12/2016   Dr Dozier  R hip fx - intermedullary nail 06/13/17 s/p   On Prolia , calcium , Vit D        Complication of anesthesia    ' I HAD HALLUCINATIONS WHEN THEY DID MY HIP    GERD (gastroesophageal reflux disease)    Heart murmur    Hemorrhoids    History of colonic polyps Gessner   hx of adenomas (small) 1998   Hot flash, menopausal 02/15/2012   Ongoing   D/c estrogens due to breast abnormalities 2013     Nausea 02/12/2019   8/20 d/c Midodrin, Florinef   Occasional.   Nausea after taking Pantoprazole  - take w/food     OA (osteoarthritis)    Orthostatic hypotension    Osteopenia    Osteoporosis    PAC (premature atrial contraction)    Upper respiratory infection 06/13/2023   Zpac if worse     Vitamin D  deficiency     Past Surgical History:  Procedure Laterality Date   ABDOMINAL HYSTERECTOMY     complete   APPENDECTOMY     BREAST CYST EXCISION     CATARACT EXTRACTION, BILATERAL     04/23/18 left eye, 04/02/18 right eye   COLONOSCOPY  Multiple   Adenomatous colon polyps   ESOPHAGOGASTRODUODENOSCOPY  2010   INTRAMEDULLARY (IM) NAIL INTERTROCHANTERIC Right 06/13/2016   Procedure: INTRAMEDULLARY (IM) NAIL INTERTROCHANTRIC;  Surgeon: Eva Dozier, MD;  Location: [facility] OR;  Service: Orthopedics;  Laterality: Right;   RIGHT/LEFT HEART CATH AND CORONARY ANGIOGRAPHY N/A 07/31/2023   Procedure: RIGHT/LEFT HEART CATH AND CORONARY ANGIOGRAPHY;  Surgeon: Darron Deatrice LABOR, MD;  Location: [facility] INVASIVE CV LAB;  Service: Cardiovascular;  Laterality: N/A;   TONSILLECTOMY       Social History:  The patient  reports that she has never smoked. She has never used smokeless tobacco. She reports that she does not currently use alcohol. She reports that she does not use drugs.   Family History:  The patient's family history includes Diabetes (age of onset: 73) in her mother; Hypertension in an other family member.  No  history of CAD known    Mother had PPM    ROS:  Please see the history of present illness.  All other systems are reviewed and  Negative to the above problem except as noted.    PHYSICAL EXAM: VS:  BP 110/60 (BP Location: Right Arm, Patient Position: Sitting, Cuff Size: Normal)   Pulse 82   Ht 5' 1 (1.549 m)   Wt 88 lb (39.9 kg)   SpO2 96%   BMI 16.63 kg/m    GEN: Thin 88 year old in no acute distress  HEENT: normal  Neck: JVP is not elevated   Cardiac: RRR; I/VI systolic murmur apex  Respiratory:  clear to auscultation  GI: soft, nontender  No hepatomegaly Ext   No LE edema      EKG:  EKG is not ordered today  R/L heart cath  07/31/23    Mid LAD lesion is 30% stenosed.   Ost LAD to Prox LAD lesion is 40% stenosed.   1.  Short left main coronary artery with moderate ostial LAD stenosis.  No evidence of obstructive disease overall. 2.  Left ventricular angiography was not performed.  EF was severely reduced by echo. 3.  Right heart catheterization showed severely elevated filling pressures, moderate to severe pulmonary hypertension and normal cardiac index.   RA: 13 mmHg RV: 53/18 mmHg PW: 35 mmHg with no large V waves. PA: 58/35 with a mean of 45 mmHg. LVEDP is 40 mmHg. Cardiac output: 3.19 with an index of 2.27.   Recommendations: The patient has nonischemic cardiomyopathy could be due to severe mitral regurgitation.  She is severely volume overloaded and spite of IV diuresis.  This will be continued for now. Consider evaluation for mitral valve clip once heart failure is optimized.  However, considering her age and frail status, she might not be a candidate.        Echo  07/25/23  1  Left ventricular ejection fraction, by estimation, is 25% . Left ventricular ejection fraction by 3D volume is 33 % . The left ventricle has severely decreased function. The left ventricle demonstrates global hypokinesis with best preserved function at the lateral base. The left  ventricular internal cavity size was mildly dilated. There is mild left ventricular hypertrophy. Left ventricular diastolic parameters are indeterminate.   2  Right ventricular systolic function is mildly reduced. The right ventricular size is normal. There is moderately elevated pulmonary artery systolic pressure. The estimated right ventricular systolic pressure is 50. 8 mmHg.  3. Left atrial size was severely dilated.  4. Right atrial size was mildly dilated.  5. A small pericardial effusion is present.  6. The mitral valve is grossly normal. Severe mitral valve regurgitation. No evidence of mitral stenosis.  7. The aortic valve is grossly normal. There is mild thickening of the aortic valve. Aortic valve regurgitation is trivial. No aortic stenosis is present.  8. The inferior vena cava is normal in size with < 50% respiratory variability, suggesting right atrial pressure of 8 mmHg. Lipid Panel    Component Value Date/Time   CHOL 227 (H) 07/12/2021 1222   TRIG 45 07/12/2021 1222   HDL 107 07/12/2021 1222   CHOLHDL 2.1 07/12/2021 1222   CHOLHDL 2 04/10/2018 1207   VLDL 13.6 04/10/2018 1207   LDLCALC 112 (H) 07/12/2021 1222   LDLDIRECT 104.0 03/17/2013 1026      Wt Readings from Last 3 Encounters:  06/22/24 88 lb (39.9 kg)  05/13/24 84 lb (38.1 kg)  03/25/24 87 lb 3.2 oz (39.6 kg)      ASSESSMENT AND PLAN:  1  HFrEF.   Pt with severe  LV dysfunction and severe MR on echo in Jan 2025 New   She underwent  R and L heart catheterization.  High filling pressures  No V wave  Pt diuresed Maintained on Toprol  XL and aldactone .     Seen by A Thukkani in Aug   Not felt to be a candidate for edge to edge repair  SInce seen she has done OK    No SOB Exam without volume increase  Would set up for repeat echo to reassess.     2  Hx orthostatic hypotension.   Pt had been on florinef  in past  Not now   3  CAD  Mild at [facility] in Jan 2025  4  HL  LDL 2023 was 112   Follow for now   PT  intolerant to statins   Follow up end of APril   Current medicines are reviewed at length with the patient today.  The patient does not have concerns regarding medicines.   Signed, Vina Gull, MD  06/22/2024 1:45 PM    [facility] [address] [place], [place], [place]  [address] Phone: [phone]; Fax: [phone]

## 2024-06-22 ENCOUNTER — Ambulatory Visit: Attending: Internal Medicine | Admitting: Internal Medicine

## 2024-06-22 VITALS — BP 110/60 | HR 82 | Ht 61.0 in | Wt 88.0 lb

## 2024-06-22 DIAGNOSIS — R931 Abnormal findings on diagnostic imaging of heart and coronary circulation: Secondary | ICD-10-CM | POA: Diagnosis not present

## 2024-06-22 NOTE — Patient Instructions (Signed)
 Medication Instructions:  Your physician recommends that you continue on your current medications as directed. Please refer to the Current Medication list given to you today.  *If you need a refill on your cardiac medications before your next appointment, please call your pharmacy*  Lab Work: none If you have labs (blood work) drawn today and your tests are completely normal, you will receive your results only by: MyChart Message (if you have MyChart) OR A paper copy in the mail If you have any lab test that is abnormal or we need to change your treatment, we will call you to review the results.  Testing/Procedures: Echo  Your physician has requested that you have an echocardiogram. Echocardiography is a painless test that uses sound waves to create images of your heart. It provides your doctor with information about the size and shape of your heart and how well your hearts chambers and valves are working. This procedure takes approximately one hour. There are no restrictions for this procedure. Please do NOT wear cologne, perfume, aftershave, or lotions (deodorant is allowed). Please arrive 15 minutes prior to your appointment time.  Please note: We ask at that you not bring children with you during ultrasound (echo/ vascular) testing. Due to room size and safety concerns, children are not allowed in the ultrasound rooms during exams. Our front office staff cannot provide observation of children in our lobby area while testing is being conducted. An adult accompanying a patient to their appointment will only be allowed in the ultrasound room at the discretion of the ultrasound technician under special circumstances. We apologize for any inconvenience.   Follow-Up: At [facility], you and your health needs are our priority.  As part of our continuing mission to provide you with exceptional heart care, our providers are all part of one team.  This team includes your primary  Cardiologist (physician) and Advanced Practice Providers or APPs (Physician Assistants and Nurse Practitioners) who all work together to provide you with the care you need, when you need it.  Your next appointment:   4 months  Provider:   Dr. Okey  We recommend signing up for the patient portal called MyChart.  Sign up information is provided on this After Visit Summary.  MyChart is used to connect with patients for Virtual Visits (Telemedicine).  Patients are able to view lab/test results, encounter notes, upcoming appointments, etc.  Non-urgent messages can be sent to your provider as well.   To learn more about what you can do with MyChart, go to [email or web address].   Other Instructions none

## 2024-06-26 ENCOUNTER — Other Ambulatory Visit: Payer: Self-pay | Admitting: *Deleted

## 2024-06-26 MED ORDER — FAMOTIDINE 20 MG PO TABS: 20.0000 mg | ORAL_TABLET | Freq: Every day | ORAL | 5 refills | Status: AC

## 2024-07-06 ENCOUNTER — Other Ambulatory Visit: Payer: Self-pay | Admitting: Internal Medicine

## 2024-08-12 ENCOUNTER — Ambulatory Visit: Admitting: Internal Medicine

## 2024-08-28 ENCOUNTER — Ambulatory Visit: Payer: Medicare Other

## 2024-09-16 ENCOUNTER — Ambulatory Visit (HOSPITAL_COMMUNITY)

## 2024-09-23 ENCOUNTER — Ambulatory Visit: Admitting: Internal Medicine

## 2024-10-30 ENCOUNTER — Ambulatory Visit: Admitting: Internal Medicine

## 2024-11-18 ENCOUNTER — Ambulatory Visit: Admitting: Internal Medicine
